# Patient Record
Sex: Male | Born: 1950
Health system: Southern US, Community
[De-identification: ages and names within clinical notes are randomized; demographics above are authoritative.]

## PROBLEM LIST (undated history)

## (undated) DIAGNOSIS — F1410 Cocaine abuse, uncomplicated: Secondary | ICD-10-CM

## (undated) DIAGNOSIS — E7800 Pure hypercholesterolemia, unspecified: Secondary | ICD-10-CM

## (undated) DIAGNOSIS — F4310 Post-traumatic stress disorder, unspecified: Secondary | ICD-10-CM

## (undated) DIAGNOSIS — F1010 Alcohol abuse, uncomplicated: Secondary | ICD-10-CM

## (undated) DIAGNOSIS — S2232XA Fracture of one rib, left side, initial encounter for closed fracture: Secondary | ICD-10-CM

## (undated) DIAGNOSIS — C61 Malignant neoplasm of prostate: Secondary | ICD-10-CM

## (undated) DIAGNOSIS — F419 Anxiety disorder, unspecified: Secondary | ICD-10-CM

## (undated) DIAGNOSIS — C50919 Malignant neoplasm of unspecified site of unspecified female breast: Secondary | ICD-10-CM

## (undated) DIAGNOSIS — Z720 Tobacco use: Secondary | ICD-10-CM

## (undated) DIAGNOSIS — F32A Depression, unspecified: Secondary | ICD-10-CM

## (undated) DIAGNOSIS — I639 Cerebral infarction, unspecified: Secondary | ICD-10-CM

## (undated) DIAGNOSIS — K224 Dyskinesia of esophagus: Secondary | ICD-10-CM

## (undated) DIAGNOSIS — I2510 Atherosclerotic heart disease of native coronary artery without angina pectoris: Secondary | ICD-10-CM

## (undated) DIAGNOSIS — I771 Stricture of artery: Secondary | ICD-10-CM

## (undated) HISTORY — DX: Stricture of artery: I77.1

## (undated) HISTORY — DX: Post-traumatic stress disorder, unspecified: F43.10

## (undated) HISTORY — DX: Depression, unspecified: F32.A

## (undated) HISTORY — DX: Dyskinesia of esophagus: K22.4

## (undated) HISTORY — PX: CAROTID STENT: SHX1301

## (undated) HISTORY — PX: CORONARY ARTERY BYPASS GRAFT: SHX141

## (undated) HISTORY — DX: Tobacco use: Z72.0

---

## 1997-11-18 ENCOUNTER — Emergency Department (HOSPITAL_COMMUNITY): Admission: EM | Admit: 1997-11-18 | Discharge: 1997-11-18 | Payer: Self-pay | Admitting: Emergency Medicine

## 1997-12-28 ENCOUNTER — Emergency Department (HOSPITAL_COMMUNITY): Admission: EM | Admit: 1997-12-28 | Discharge: 1997-12-28 | Payer: Self-pay | Admitting: Emergency Medicine

## 1998-02-20 ENCOUNTER — Ambulatory Visit (HOSPITAL_COMMUNITY): Admission: RE | Admit: 1998-02-20 | Discharge: 1998-02-20 | Payer: Self-pay | Admitting: Internal Medicine

## 1998-04-17 ENCOUNTER — Ambulatory Visit (HOSPITAL_COMMUNITY): Admission: RE | Admit: 1998-04-17 | Discharge: 1998-04-17 | Payer: Self-pay | Admitting: Family Medicine

## 1998-07-13 ENCOUNTER — Encounter: Payer: Self-pay | Admitting: Internal Medicine

## 1998-07-13 ENCOUNTER — Ambulatory Visit (HOSPITAL_COMMUNITY): Admission: RE | Admit: 1998-07-13 | Discharge: 1998-07-13 | Payer: Self-pay | Admitting: Internal Medicine

## 1998-08-20 ENCOUNTER — Emergency Department (HOSPITAL_COMMUNITY): Admission: EM | Admit: 1998-08-20 | Discharge: 1998-08-21 | Payer: Self-pay

## 1998-10-02 ENCOUNTER — Emergency Department (HOSPITAL_COMMUNITY): Admission: EM | Admit: 1998-10-02 | Discharge: 1998-10-02 | Payer: Self-pay | Admitting: Emergency Medicine

## 1998-10-02 ENCOUNTER — Encounter: Payer: Self-pay | Admitting: Emergency Medicine

## 1999-03-10 ENCOUNTER — Emergency Department (HOSPITAL_COMMUNITY): Admission: EM | Admit: 1999-03-10 | Discharge: 1999-03-10 | Payer: Self-pay | Admitting: Emergency Medicine

## 1999-03-10 ENCOUNTER — Encounter: Payer: Self-pay | Admitting: Emergency Medicine

## 1999-03-14 ENCOUNTER — Emergency Department (HOSPITAL_COMMUNITY): Admission: EM | Admit: 1999-03-14 | Discharge: 1999-03-15 | Payer: Self-pay | Admitting: Emergency Medicine

## 1999-03-14 ENCOUNTER — Encounter: Payer: Self-pay | Admitting: Emergency Medicine

## 2000-01-07 ENCOUNTER — Encounter: Payer: Self-pay | Admitting: Emergency Medicine

## 2000-01-07 ENCOUNTER — Emergency Department (HOSPITAL_COMMUNITY): Admission: EM | Admit: 2000-01-07 | Discharge: 2000-01-07 | Payer: Self-pay | Admitting: Emergency Medicine

## 2000-03-06 ENCOUNTER — Emergency Department (HOSPITAL_COMMUNITY): Admission: EM | Admit: 2000-03-06 | Discharge: 2000-03-07 | Payer: Self-pay | Admitting: Emergency Medicine

## 2000-06-10 ENCOUNTER — Inpatient Hospital Stay (HOSPITAL_COMMUNITY): Admission: EM | Admit: 2000-06-10 | Discharge: 2000-06-11 | Payer: Self-pay | Admitting: Emergency Medicine

## 2000-06-10 ENCOUNTER — Encounter: Payer: Self-pay | Admitting: Emergency Medicine

## 2001-11-06 ENCOUNTER — Encounter: Payer: Self-pay | Admitting: Cardiothoracic Surgery

## 2001-11-08 ENCOUNTER — Encounter: Payer: Self-pay | Admitting: Cardiothoracic Surgery

## 2001-11-08 ENCOUNTER — Inpatient Hospital Stay (HOSPITAL_COMMUNITY): Admission: RE | Admit: 2001-11-08 | Discharge: 2001-11-15 | Payer: Self-pay | Admitting: Cardiothoracic Surgery

## 2001-11-09 ENCOUNTER — Encounter: Payer: Self-pay | Admitting: Cardiothoracic Surgery

## 2001-11-10 ENCOUNTER — Encounter: Payer: Self-pay | Admitting: Cardiothoracic Surgery

## 2001-11-11 ENCOUNTER — Encounter: Payer: Self-pay | Admitting: Cardiothoracic Surgery

## 2001-11-12 ENCOUNTER — Encounter: Payer: Self-pay | Admitting: Cardiothoracic Surgery

## 2001-11-13 ENCOUNTER — Encounter: Payer: Self-pay | Admitting: Cardiothoracic Surgery

## 2001-11-14 ENCOUNTER — Encounter: Payer: Self-pay | Admitting: Cardiothoracic Surgery

## 2001-11-15 ENCOUNTER — Encounter: Payer: Self-pay | Admitting: Cardiothoracic Surgery

## 2001-12-21 ENCOUNTER — Encounter: Admission: RE | Admit: 2001-12-21 | Discharge: 2001-12-21 | Payer: Self-pay | Admitting: Cardiothoracic Surgery

## 2001-12-21 ENCOUNTER — Encounter: Payer: Self-pay | Admitting: Cardiothoracic Surgery

## 2002-01-23 ENCOUNTER — Encounter: Payer: Self-pay | Admitting: Neurology

## 2002-01-23 ENCOUNTER — Encounter: Payer: Self-pay | Admitting: Emergency Medicine

## 2002-01-23 ENCOUNTER — Inpatient Hospital Stay (HOSPITAL_COMMUNITY): Admission: EM | Admit: 2002-01-23 | Discharge: 2002-01-25 | Payer: Self-pay | Admitting: Emergency Medicine

## 2002-01-24 ENCOUNTER — Encounter: Payer: Self-pay | Admitting: Neurology

## 2002-11-23 ENCOUNTER — Emergency Department (HOSPITAL_COMMUNITY): Admission: EM | Admit: 2002-11-23 | Discharge: 2002-11-23 | Payer: Self-pay | Admitting: Emergency Medicine

## 2002-11-23 ENCOUNTER — Encounter: Payer: Self-pay | Admitting: Emergency Medicine

## 2002-12-03 ENCOUNTER — Emergency Department (HOSPITAL_COMMUNITY): Admission: EM | Admit: 2002-12-03 | Discharge: 2002-12-04 | Payer: Self-pay | Admitting: Emergency Medicine

## 2003-03-18 ENCOUNTER — Encounter: Payer: Self-pay | Admitting: Emergency Medicine

## 2003-03-18 ENCOUNTER — Emergency Department (HOSPITAL_COMMUNITY): Admission: EM | Admit: 2003-03-18 | Discharge: 2003-03-19 | Payer: Self-pay | Admitting: Emergency Medicine

## 2003-03-19 ENCOUNTER — Encounter: Payer: Self-pay | Admitting: Emergency Medicine

## 2003-03-28 ENCOUNTER — Encounter: Payer: Self-pay | Admitting: Orthopedic Surgery

## 2003-03-28 ENCOUNTER — Ambulatory Visit (HOSPITAL_COMMUNITY): Admission: RE | Admit: 2003-03-28 | Discharge: 2003-03-28 | Payer: Self-pay | Admitting: Orthopedic Surgery

## 2003-05-08 ENCOUNTER — Encounter: Admission: RE | Admit: 2003-05-08 | Discharge: 2003-05-08 | Payer: Self-pay | Admitting: Internal Medicine

## 2003-12-03 ENCOUNTER — Inpatient Hospital Stay (HOSPITAL_COMMUNITY): Admission: RE | Admit: 2003-12-03 | Discharge: 2003-12-06 | Payer: Self-pay | Admitting: Urology

## 2003-12-03 ENCOUNTER — Encounter (INDEPENDENT_AMBULATORY_CARE_PROVIDER_SITE_OTHER): Payer: Self-pay | Admitting: Specialist

## 2005-05-20 ENCOUNTER — Inpatient Hospital Stay (HOSPITAL_COMMUNITY): Admission: EM | Admit: 2005-05-20 | Discharge: 2005-05-25 | Payer: Self-pay | Admitting: Emergency Medicine

## 2005-05-31 ENCOUNTER — Observation Stay (HOSPITAL_COMMUNITY): Admission: EM | Admit: 2005-05-31 | Discharge: 2005-06-02 | Payer: Self-pay | Admitting: Emergency Medicine

## 2005-07-12 ENCOUNTER — Emergency Department (HOSPITAL_COMMUNITY): Admission: EM | Admit: 2005-07-12 | Discharge: 2005-07-12 | Payer: Self-pay | Admitting: Emergency Medicine

## 2005-07-28 ENCOUNTER — Encounter (HOSPITAL_COMMUNITY): Admission: RE | Admit: 2005-07-28 | Discharge: 2005-10-26 | Payer: Self-pay | Admitting: Cardiovascular Disease

## 2006-02-11 ENCOUNTER — Observation Stay (HOSPITAL_COMMUNITY): Admission: EM | Admit: 2006-02-11 | Discharge: 2006-02-13 | Payer: Self-pay | Admitting: Emergency Medicine

## 2007-09-03 IMAGING — CT CT ANGIO ABDOMEN
2 of 9 series · 16 of 46 positions shown, 18 images · IV contrast (120 ML OMNI 300)
Comparison: None

CLINICAL DATA: Left scapular and chest pain. Question dissection

CT ANGIOGRAPHY OF CHEST
TECHNIQUE: Multidetector CT imaging of the chest was performed during bolus
injection of intravenous contrast.  Multiplanar CT angiographic image
reconstructions were generated to evaluate the vascular anatomy.
Contrast:  120 cc Omnipaque 300
TECHNIQUE: Multidetector CT imaging of the abdomen was performed before and
during bolus injection of intravenous contrast.  Multiplanar CT angiographic
image reconstructions were generated to evaluate the vascular anatomy.

[Series 7: recon 3: aortic disection · axial · 0.76mm/px · z∈[-446,-45]mm · 13 of 375 slices shown, 15 images]
[im 27/375  soft-tissue]
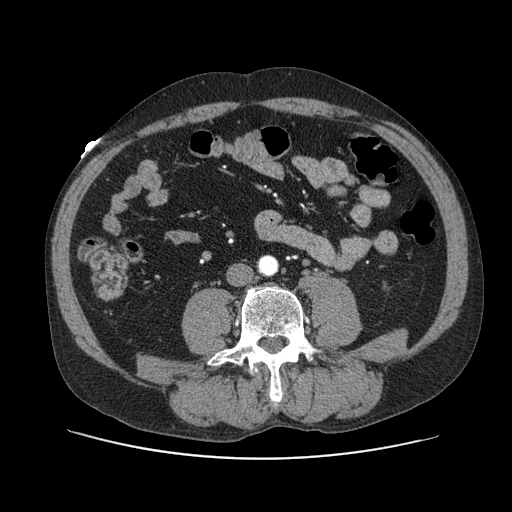
[im 27/375  bone]
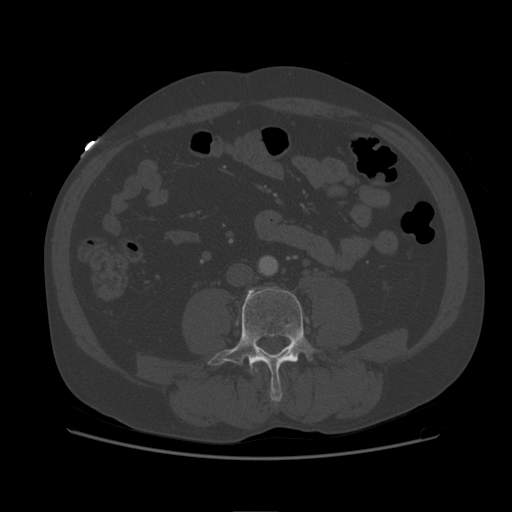
[im 54/375  soft-tissue]
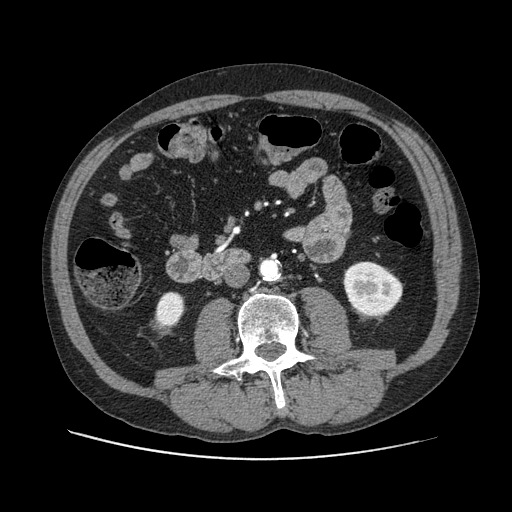
[im 81/375  soft-tissue]
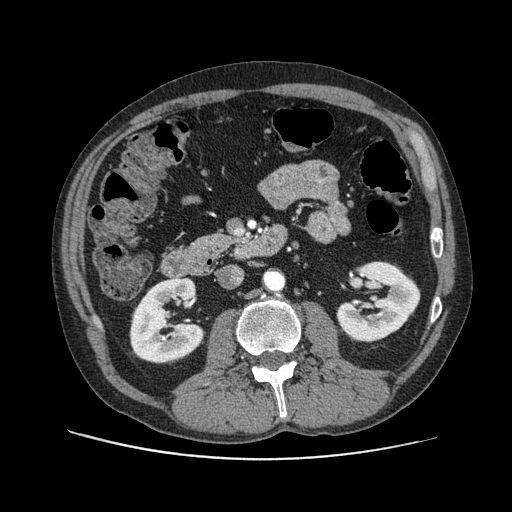
[im 107/375  soft-tissue]
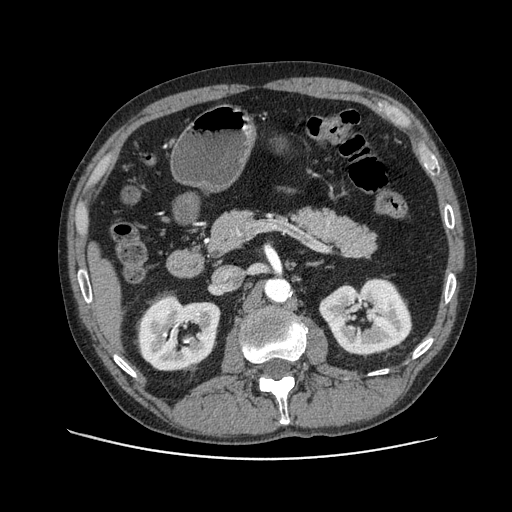
[im 134/375  soft-tissue]
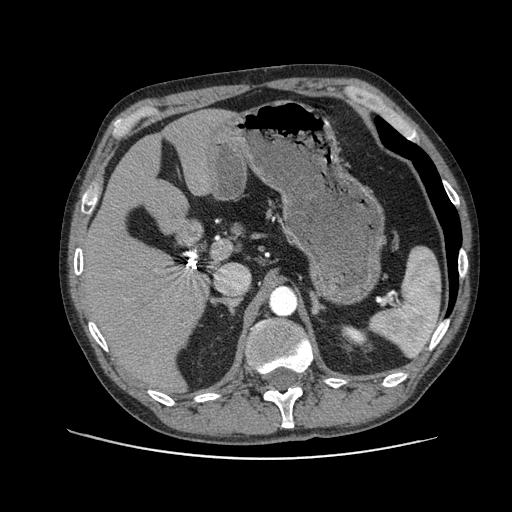
[im 161/375  soft-tissue]
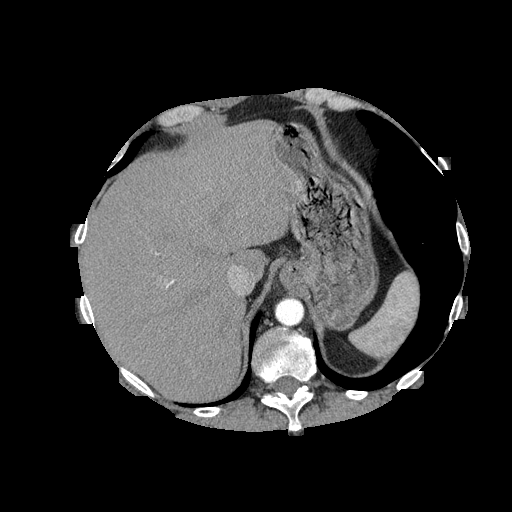
[im 188/375  soft-tissue]
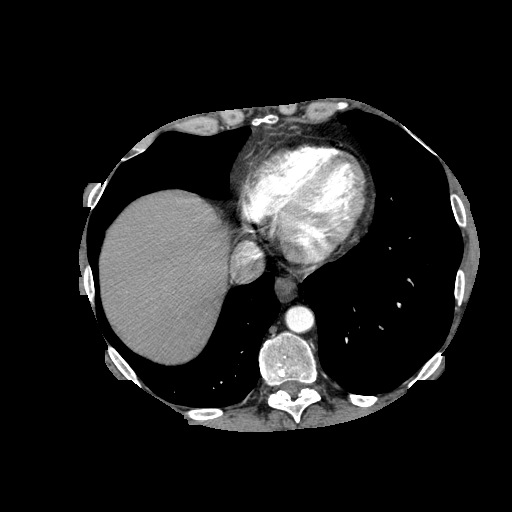
[im 214/375  soft-tissue]
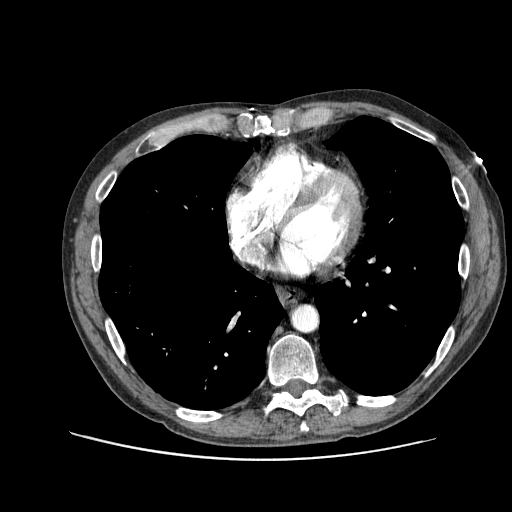
[im 241/375  soft-tissue]
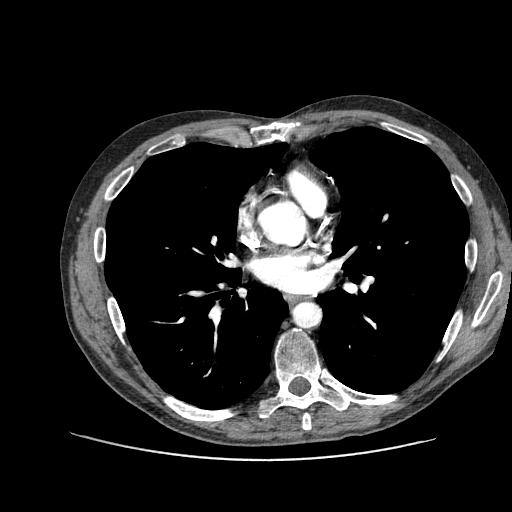
[im 241/375  bone]
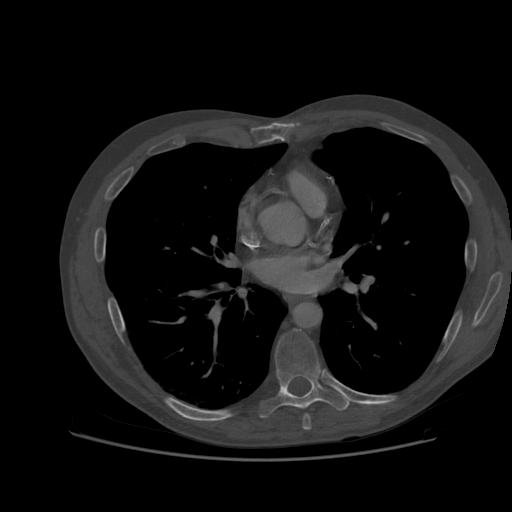
[im 268/375  soft-tissue]
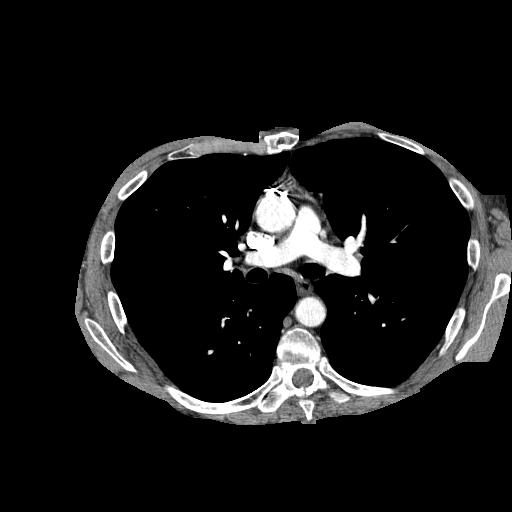
[im 294/375  soft-tissue]
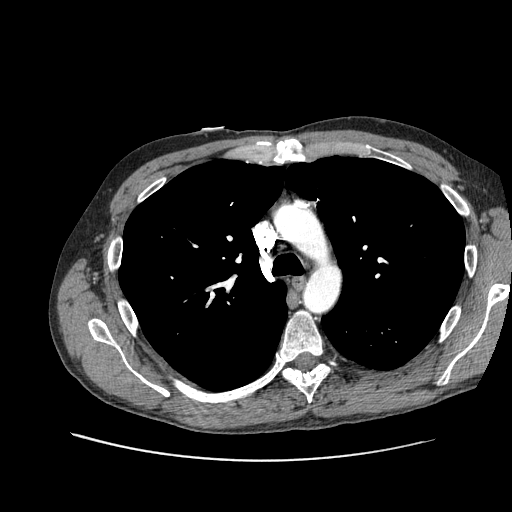
[im 321/375  soft-tissue]
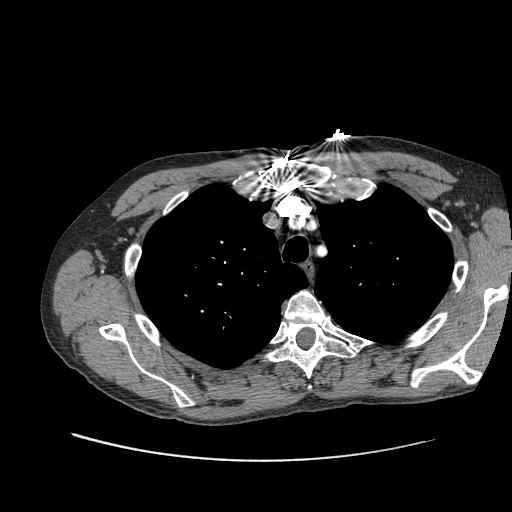
[im 348/375  soft-tissue]
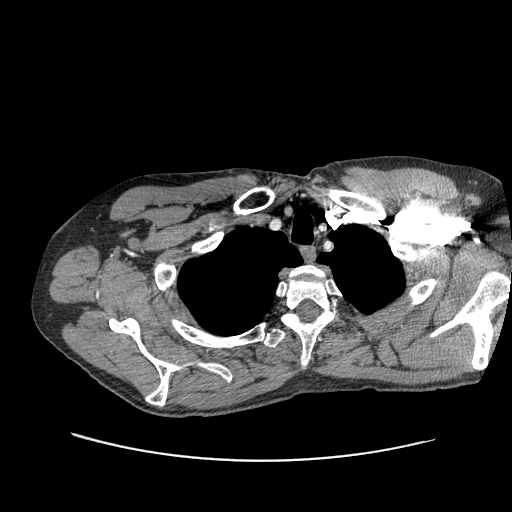

[Series 701: reformatted · sagittal · 0.94mm/px · 3 of 166 slices shown]
[im 56/166  soft-tissue]
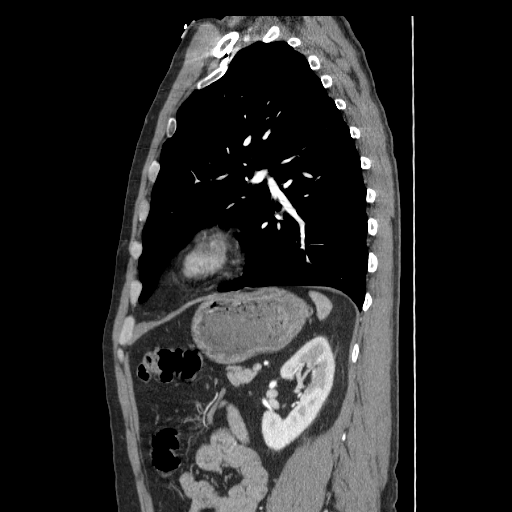
[im 74/166  soft-tissue]
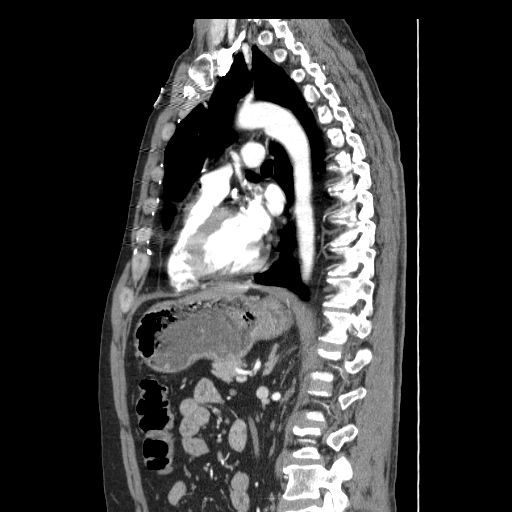
[im 92/166  soft-tissue]
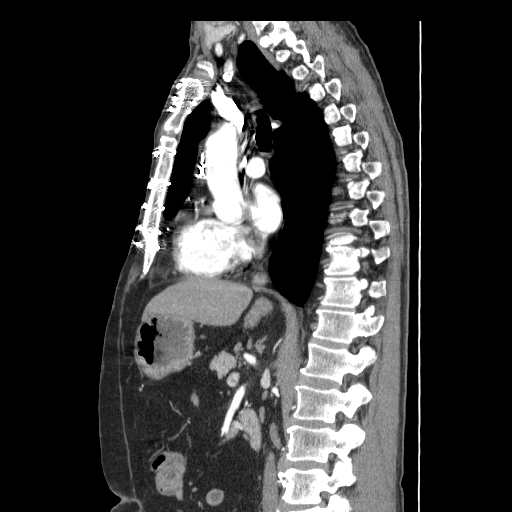

[16 of 46 positions shown; findings below may reference images not displayed]

FINDINGS: No evidence of aortic dissection or aneurysm. There is atherosclerotic
disease in the aortic arch and proximal great vessels. There appears to be
significant stenosis of the left subclavian artery. Left vertebral artery not
visualized on this study. No filling defects in the pulmonary arteries to
suggest pulmonary emboli.

Patient status post CABG. Heart is normal size. There is dependent atelectasis
bilaterally. No mediastinal, hilar, or axillary adenopathy. There is a posterior
medial left lower lobe nodule which is smoothly marginated and abuts the
posterior medial pleural surface measuring 9 mm. This can be followed with 6
month repeat study.

IMPRESSION

No evidence of aortic dissection or aneurysm.

No pulmonary embolus

Atherosclerotic disease in the aortic arch and proximal great vessels. There is
moderate to severe stenosis of the left subclavian artery likely just proximal
to the takeoff of the left vertebral artery. The left vertebral artery is not
visualized on this study. Proximal right vertebral artery and carotid arteries
are unremarkable. 

There is post CABG.

CT ANGIOGRAPHY OF ABDOMEN
FINDINGS: Aorta shows no evidence of dissection or aneurysm. Mild arthroscopic
calcification and mural plaque.

Liver, spleen, pancreas, , kidneys unremarkable. Small porta hepatis and
gastrohepatic ligament lymph nodes, none of which are pathologically enlarged.
Mild prominence of the adrenal glands diffusely most compatible with
hyperplasia. Status post cholecystectomy. No free fluid, free air, or adenopathy
bowel grossly unremarkable.

IMPRESSION

No evidence of aortic dissection or aneurysm.

Mild bilateral adrenal hyperplasia.

## 2007-09-06 IMAGING — CR DG CHEST 2V
2 series · 2 of 2 positions shown · non-contrast
Comparison: none

HISTORY: Unstable angina, precatheterization

CHEST 2 VIEWS:
Comparison 05/23/2005
Normal heart size status post CABG.
Right arm PICC line stable, tip SVC.
Pulmonary vascularity and mediastinal contours moderate
Changes of COPD with minimal chronic bronchitis.
No infiltrate or effusion.
No pneumothorax.

[w chest pa]
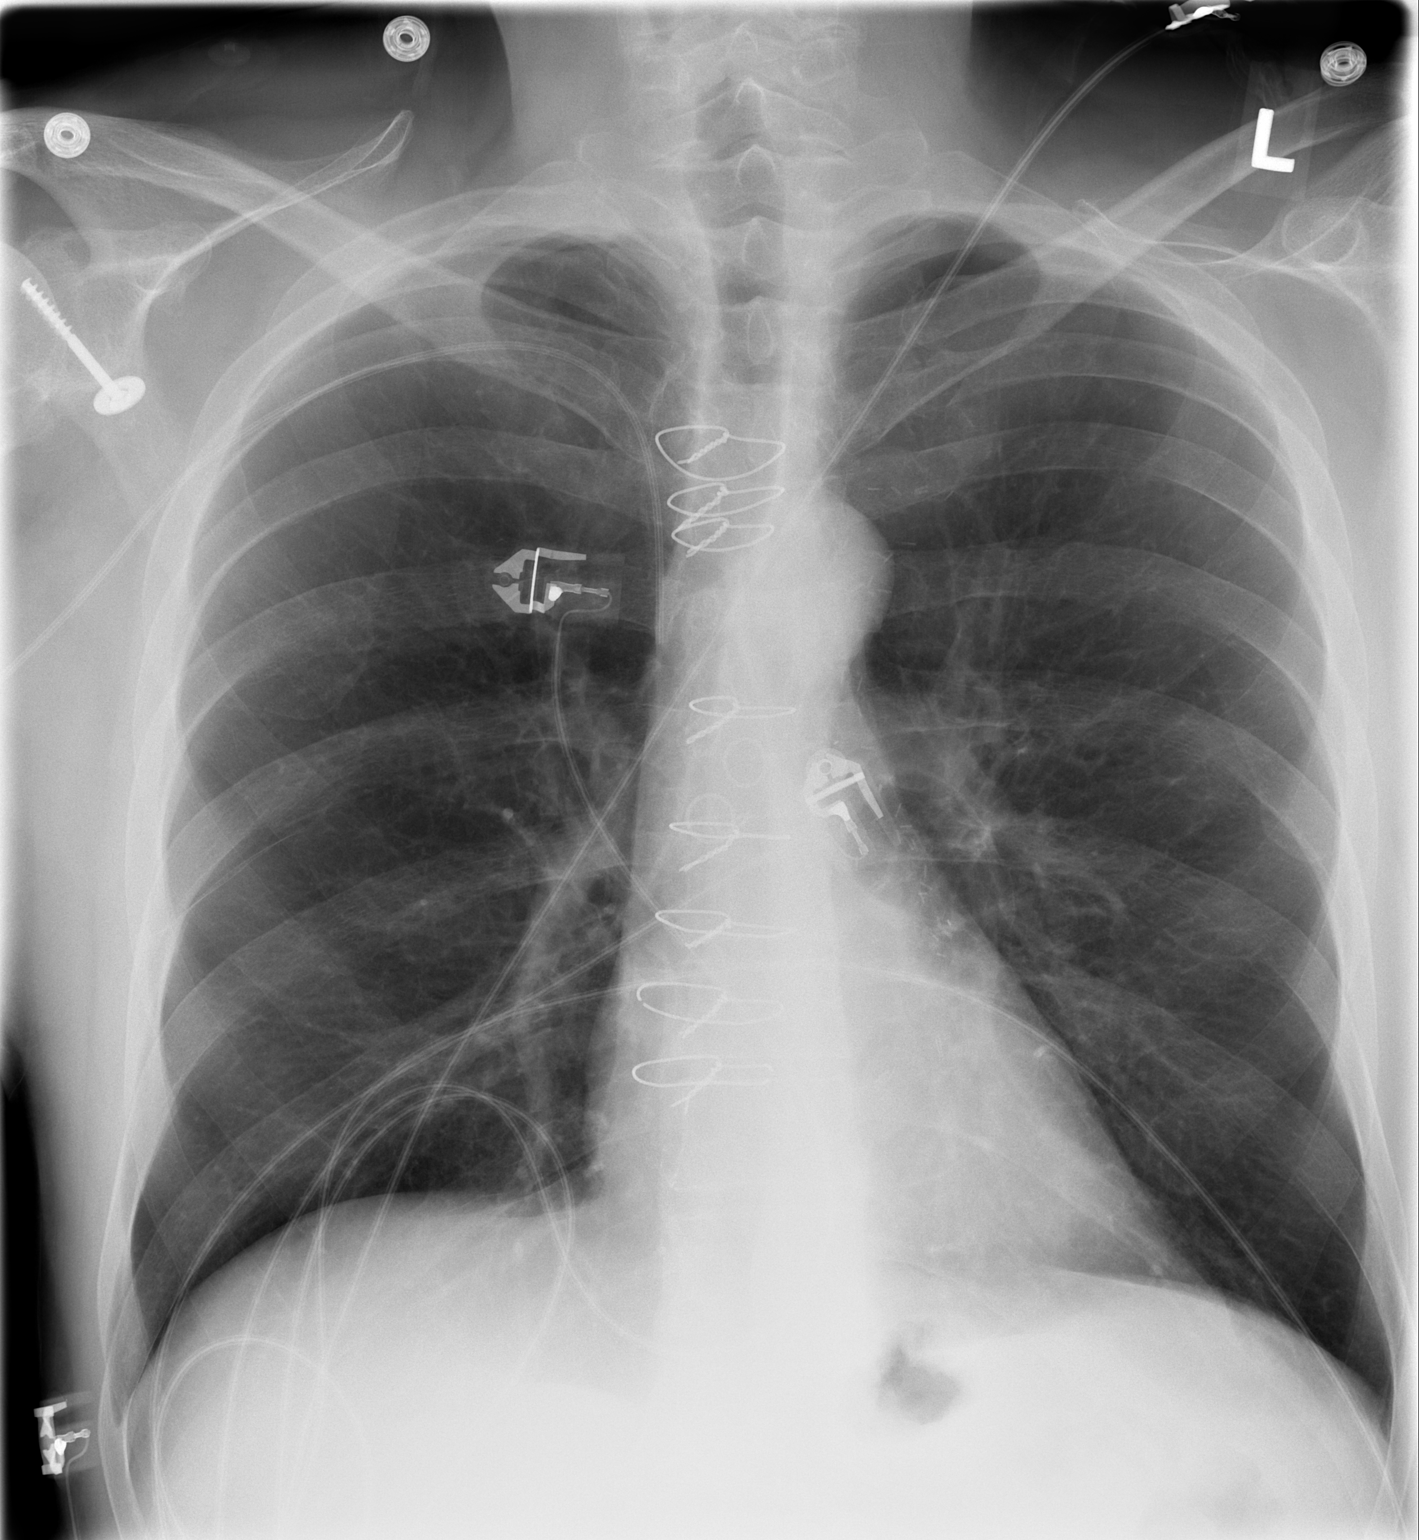

[w chest lat]
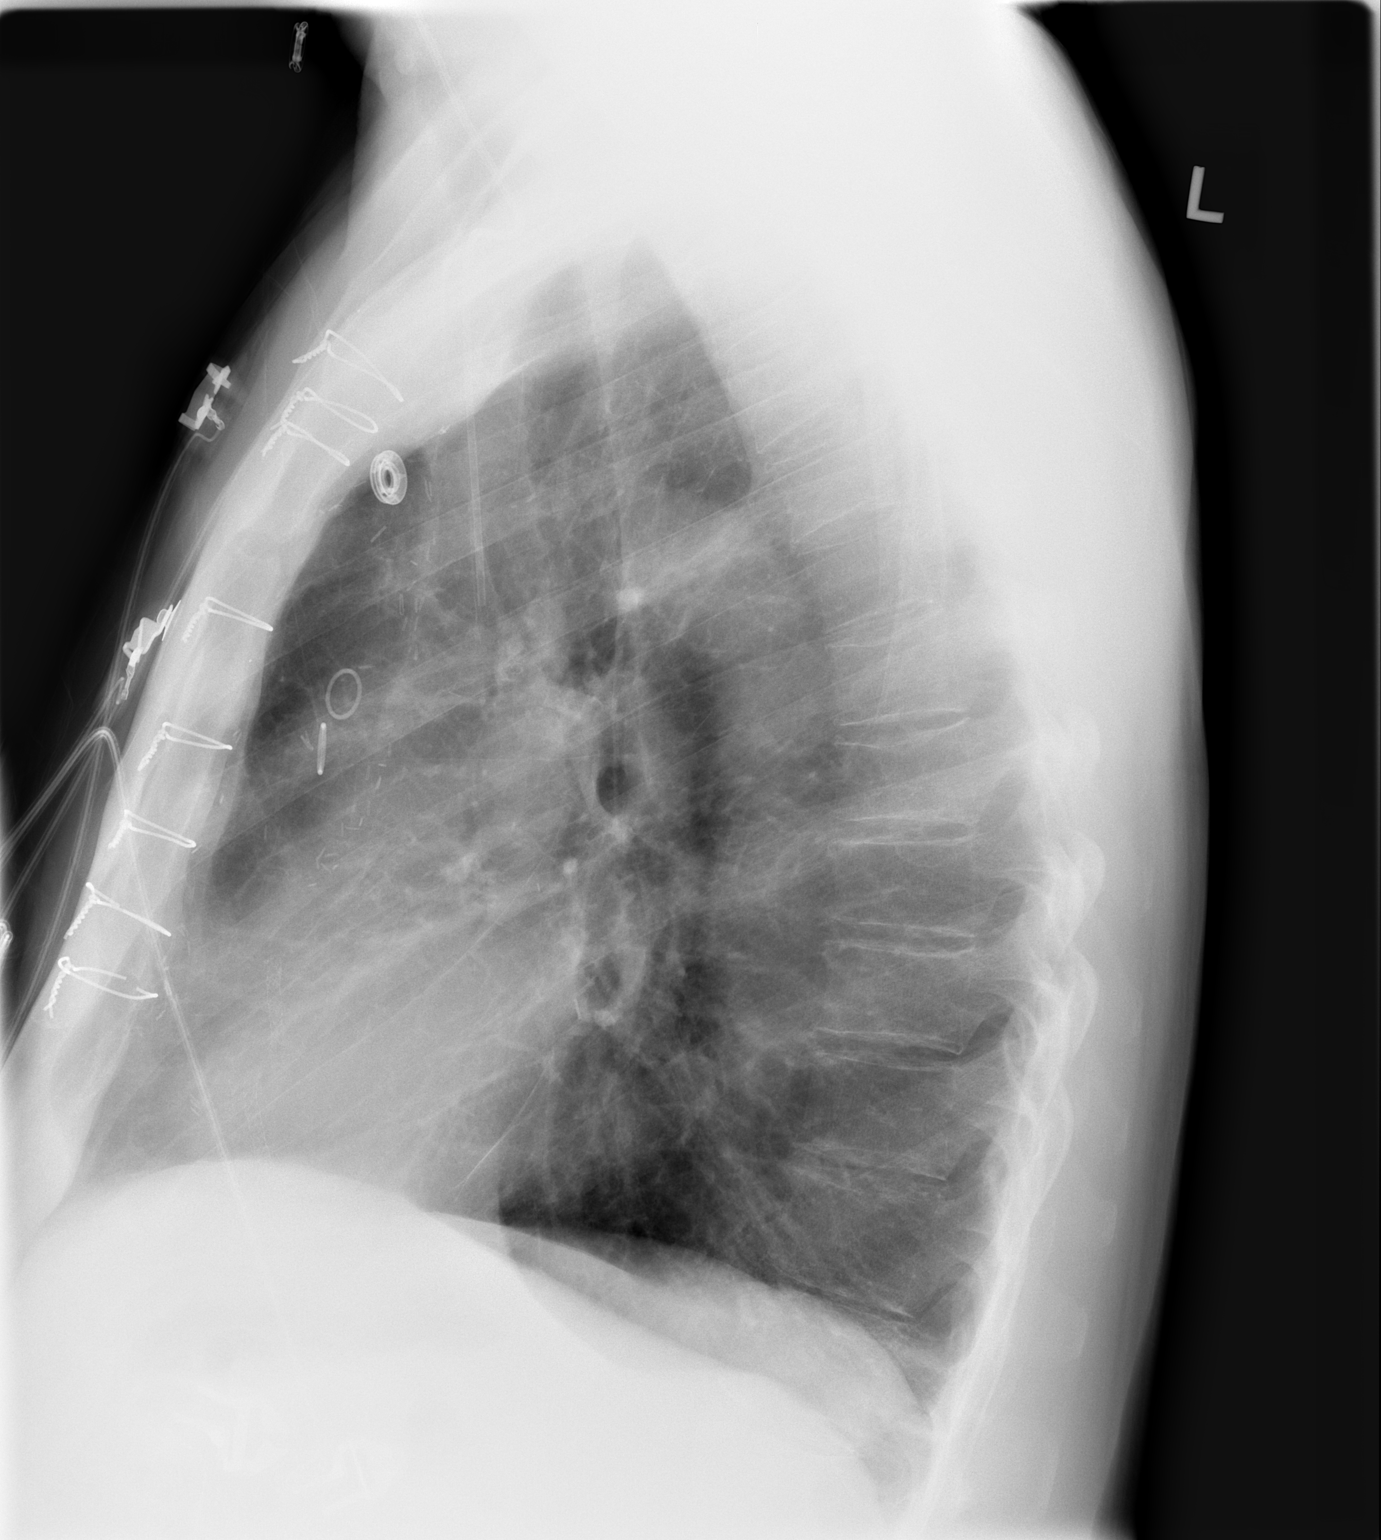

[2 of 2 positions shown; findings below may reference images not displayed]

IMPRESSION: CABG and COPD.
No acute abnormalities.

## 2009-12-24 ENCOUNTER — Emergency Department (HOSPITAL_COMMUNITY): Admission: EM | Admit: 2009-12-24 | Discharge: 2009-12-24 | Payer: Self-pay | Admitting: Emergency Medicine

## 2009-12-26 ENCOUNTER — Emergency Department (HOSPITAL_COMMUNITY): Admission: EM | Admit: 2009-12-26 | Discharge: 2009-12-26 | Payer: Self-pay | Admitting: Emergency Medicine

## 2010-06-06 ENCOUNTER — Emergency Department (HOSPITAL_COMMUNITY)
Admission: EM | Admit: 2010-06-06 | Discharge: 2010-06-06 | Payer: Self-pay | Source: Home / Self Care | Admitting: Emergency Medicine

## 2010-08-21 LAB — URINALYSIS, ROUTINE W REFLEX MICROSCOPIC
Bilirubin Urine: NEGATIVE
Glucose, UA: NEGATIVE mg/dL
Hgb urine dipstick: NEGATIVE
Ketones, ur: NEGATIVE mg/dL
Nitrite: NEGATIVE
Protein, ur: NEGATIVE mg/dL
Specific Gravity, Urine: 1.012 (ref 1.005–1.030)
Urobilinogen, UA: 0.2 mg/dL (ref 0.0–1.0)
pH: 6 (ref 5.0–8.0)

## 2010-08-21 LAB — COMPREHENSIVE METABOLIC PANEL
ALT: 18 U/L (ref 0–53)
AST: 19 U/L (ref 0–37)
Albumin: 3.4 g/dL — ABNORMAL LOW (ref 3.5–5.2)
Alkaline Phosphatase: 64 U/L (ref 39–117)
BUN: 4 mg/dL — ABNORMAL LOW (ref 6–23)
CO2: 21 mEq/L (ref 19–32)
Calcium: 8.4 mg/dL (ref 8.4–10.5)
Chloride: 108 mEq/L (ref 96–112)
Creatinine, Ser: 0.68 mg/dL (ref 0.4–1.5)
GFR calc Af Amer: >60 mL/min (ref >60)
GFR calc non Af Amer: >60 mL/min (ref >60)
Glucose, Bld: 100 mg/dL — ABNORMAL HIGH (ref 70–99)
Potassium: 4.1 mEq/L (ref 3.5–5.1)
Sodium: 135 mEq/L (ref 135–145)
Total Bilirubin: 0.4 mg/dL (ref 0.3–1.2)
Total Protein: 6.4 g/dL (ref 6.0–8.3)

## 2010-08-21 LAB — LIPASE, BLOOD: Lipase: 29 U/L (ref 11–59)

## 2010-10-22 NOTE — Discharge Summary (Signed)
. [facility]  Patient:    [name] Pace, [name] Pace Visit Number: [id number] MRN: [id number]          Service Type: SUR Location: 2000 2009 01 Attending Physician:  Mikey Bussing Dictated by:   Adair Patter, P.A. Admit Date:  11/08/2001 Discharge Date: 11/15/2001   CC:         Runell Gess, M.D., [facility]   Discharge Summary  DATE OF BIRTH:  1951-03-27  ADMITTING DIAGNOSIS:  Exertional angina.  SECONDARY DIAGNOSES:  1. Hypertension.  2. History of infectious hepatitis.  3. Postoperative anemia secondary to blood loss.  DISCHARGE DIAGNOSIS:  Coronary artery disease.  HOSPITAL COURSE:  The patient was admitted to [facility] on November 08, 2001, secondary to exertional angina and three-vessel coronary artery disease. He was seen and evaluated by Dr. Donata Clay as an outpatient.  Dr. Donata Clay recommended surgical correction.  On date of admission, patient underwent a coronary artery bypass graft x 3 with left internal mammary artery anastomosis to the left anterior descending artery, saphenous vein graft to the right coronary artery, and saphenous vein graft to the circumflex artery.  No complications were noted during procedure.  Postoperatively, patient required some pressor support with dopamine.  This was discontinued as tolerated. Patient was started on digoxin for heart rate control.  Other than some minor pulmonary issues, patient had a relatively uneventful hospital course.  He was started on empiric Tequin, given a 14 day course.  He also underwent aggressive pulmonary toilet and started on Combivent inhaler.  Good heart rate and blood pressure control was obtained with Toprol and Altace.  He was subsequently deemed stable for discharge on November 15, 2001.  MEDICATIONS AT TIME OF DISCHARGE:  1. Tylox one to two tablets every 4 to 6 hours as needed for pain.  2. Tequin 400 mg one daily for nine  days.  3. Combivent inhaler 18-90 mcg two puffs four times daily.  4. Multivitamin one daily.  5. Niferex 150 one daily.  6. Colace 100 mg one tablet twice daily.  7. Guaifenesin 600 mg two tablets twice daily for nine days.  8. Prevacid 30 mg one daily.  9. Aspirin 325 mg one daily. 10. Digoxin 0.125 mg one daily. 11. Toprol XL 25 mg one daily. 12. Lipitor 20 mg one daily. 13. Altace 2.5 mg one twice daily.  ACTIVITY:  Patient was told no driving, strenuous activity, or lifting heavy objects.  He was told to walk and continue breathing exercises.  DISCHARGE DIET:  Low fat, low salt.  WOUND CARE:  Patient was told he could shower and clean his incision with soap and water.  He will have his staples removed in approximately 10 days.  We will arrange a visiting nurse to do this.  DISPOSITION:  Home.  FOLLOW-UP:  Patient was told to call his cardiologist, Dr. Allyson Sabal, for a two-week appointment.  He was told to see Dr. Donata Clay on Friday, July 11, at 11:15 a.m.  He was told to go to the [facility] one hour before his appointment for chest x-ray. Dictated by:   Adair Patter, P.A. Attending Physician:  Mikey Bussing DD:  11/15/01 TD:  11/17/01 Job: [id number]

## 2010-10-22 NOTE — Cardiovascular Report (Signed)
NAME:  [name] Pace, [name] Pace NO.:  [id number]   MEDICAL RECORD NO.:  [id number]          PATIENT TYPE:  INP   LOCATION:  6524                         FACILITY:  [facility]   PHYSICIAN:  Nicki Guadalajara, M.D.     DATE OF BIRTH:  01/09/1951   DATE OF PROCEDURE:  05/23/2005  DATE OF DISCHARGE:                              CARDIAC CATHETERIZATION   INDICATIONS:  [name] is a 60 year old patient of Dr. Allyson Sabal who  underwent CBG revascularization surgery in 2003 by Dr. Donata Clay with a LIMA  to his LAD, saphenous vein graft to his circumflex marginal and saphenous  vein graft to his right coronary artery. Ejection fraction at that time was  45%. The patient was admitted with chest pain to [facility] on  May 21, 2005. He also was noted to have blood pressure differential  between both arms and a CT scan raised the possibility of left subclavian  stenosis. He is now scheduled for cardiac catheterization.   PROCEDURE:  After premedication with Versed 2 milligrams intravenously, the  patient was prepped and draped in the usual fashion. Right femoral artery  was punctured anteriorly and a 5-French sheath was inserted. Diagnostic  catheterization was done with 5-French Judkins 4 left and right coronary  catheters. The right catheter was used for selective angiography into the  vein grafts as well as into the left subclavian system. The left internal  mammary artery was well visualized and in a retrograde fashion from the LAD  system all way up to the subclavian and because of the high-grade subclavian  stenosis this was not crossed with the catheter but multiple views were  performed to document the extent of stenosis in this left subclavian artery  jeopardizing antegrade flow. The pigtail catheter was used for biplane cine  left ventriculography as well as distal aortography.   With the demonstration of total occlusion of the vein graft supplying his  right coronary  artery with a least three high-grade lesions in the native  right coronary artery, the decision was made while the patient was on the  table to open up his native right coronary artery and ultimately plan staged  peripheral intervention to his high-grade subclavian stenosis. Integrilin  double bolus and weight-adjusted heparinization 41 units were administered.  600 milligrams of oral Plavix was given. The patient received an additional  2 milligrams Versed. The 5-French sheath was upgraded to a 6-French sheath  without difficulty. Due to the upward takeoff of the native right coronary  artery, ultimately a 6-French hockey stick with side holes guide was used  for the interventional procedure. An ATW wire was advanced down the native  right coronary artery and was used to help judge lesion length.  Predilatation was done with a 2.5 x 20 mm Maverick balloon in the region of  the crux with dilatation of both lesions in the crux and beyond and also at  the mid-RCA level. A 3.0 x 28 mm drug-eluting Cypher stent was then  successfully deployed with careful attention to make certain the tandem  lesions in  the crux and beyond were covered by this stent. This was dilated  x2 up to 17 atmospheres. A 3.0 x 23 mm drug-eluting Cypher stent was then  inserted at the mid-right coronary artery lesion with careful attention to  make certain that this entire lesion was covered with the stented segment.  Poststent dilatation was done utilizing a 3.25 x 20 mm Quantum balloon in  both stents with dilatation up to 12 atmospheres corresponding to a 3.25 mm  size. Scout angiography confirmed an excellent angiographic result. ACT was  documented therapeutic throughout the procedure. During the procedure, the  patient also received numerous doses of intracoronary nitroglycerin.  Arterial sheath was sutured in place. Plans for sheath removal later today.   HEMODYNAMIC DATA:  The central aortic pressure was 150/80.  Left ventricular  pressure was 150/12, post A-wave 25.   ANGIOGRAPHIC DATA:  The left main coronary artery was a long vessel with  mild irregularity and 10-20% distal narrowing. It was hard to see the native  circumflex arise from this but I believe this did bifurcate into the LAD and  small circumflex system.   The LAD gave rise to the first diagonal vessel with 95% stenosis. There was  irregularity of the proximal to mid-LAD without high-grade stenoses. There  was excellent retrograde filling of the entire LIMA vessel up to the  subclavian visualization. The LIMA was widely patent without stenosis.   The circumflex vessel was small caliber proximally with 95% stenosis. This  vessel was occluded.   The right coronary artery was diffusely diseased and had 10-20% narrowing  proximally, followed by 90% mid-stenosis just beyond the anterior RV  marginal branch. There were then tandem stenoses in the crux and beyond of  80% proximal to the takeoff of the PDA vessel.   The vein graft supplying the right coronary was totally occluded at its  origin.   The vein graft supplying the marginal vessel was widely patent.   The left subclavian artery had a focal eccentric 90-95% stenosis proximal to  the LIMA takeoff. As noted above, the LIMA graft itself was patent and  anastomosed into the mid-LAD.   Biplane cine left ventriculography revealed low normal LV function with mild  mid inferior wall hypocontractility and low posterolateral  hypocontractility.   Distal aortography did not demonstrate any significant renal artery  stenosis. There is no significant aortoiliac disease.   Following coronary intervention to the native right coronary artery at three  sites with predilation with a 2.5 x 20 mm Maverick balloon, stenting with a  3.0 x 23 mm Cypher stent at the mid RCA and with two lesions successfully stented with a 3.0 x 28 mm Cypher stent in the region of the crux, with both  stents  being postdilated to 3.25 mm, the entire stented segments were  reduced to 0%. There was TIMI III flow. There was no evidence for  dissection.   IMPRESSION:  1.  Low normal left ventricular function with mild inferior and low      posterolateral hypocontractility.  2.  Significant native coronary artery disease with mild distal taper of the      left main of 20%, diffuse irregularity of the proximal left anterior      descending artery with narrowing of 60-70% in the region of the diagonal      takeoff with 95% diagonal stenosis, total occlusion of the circumflex      vessel, and diffuse native right coronary artery disease of  90% mid and      tandem 80% stenoses in the region of the crux.  3.  Patent left internal mammary artery to the left anterior descending      artery but it jeopardized left internal mammary artery system due to      focal eccentric 95% left subclavian stenosis.  4.  Patent saphenous vein graft to the circumflex marginal vessel.  5.  Occluded saphenous vein graft to the native right coronary artery.  6.  Successful percutaneous transluminal coronary angioplasty/stenting of      the native right coronary artery with placement of 3.0 x 23 mm Cypher      stent in the mid-right coronary artery and placement of a 3.0 x 28 mm      Cypher stent covering two additional lesions in the region of the crux      with both lesions being postdilated to 3.25 mm with a resultant stenoses      being reduced to 0%.  7.  Double bolus Integrilin/weight-adjusted heparinization.   PLAN:  Stage peripheral intervention to the proximal left subclavian artery  which is jeopardizing antegrade LIMA to LAD flow.           ______________________________  Nicki Guadalajara, M.D.     TK/MEDQ  D:  05/23/2005  T:  05/24/2005  Job:  [id number]   cc:   Madaline Savage, M.D.  Fax: [phone]   Nanetta Batty, M.D.  Fax: [phone]   Kerin Perna, M.D.  [address]  [place]  [place] [address]

## 2010-10-22 NOTE — Discharge Summary (Signed)
NAME:  [name] Pace, FALWELL NO.:  [id number]   MEDICAL RECORD NO.:  [id number]          PATIENT TYPE:  INP   LOCATION:  6524                         FACILITY:  [facility]   PHYSICIAN:  Nanetta Batty, M.D.   DATE OF BIRTH:  10/14/1950   DATE OF ADMISSION:  05/20/2005  DATE OF DISCHARGE:                                 DISCHARGE SUMMARY   DISCHARGE DIAGNOSES:  1.  Known coronary artery disease, status post intervention to the right      coronary artery.  2.  Remote coronary artery bypass grafting surgery.  3.  Peripheral vascular disease, status post stenting of the left subclavian      artery.  4.  Chronic obstructive pulmonary disease.  5.  Hyperlipidemia.  6.  Borderline hypertension.   HISTORY OF PRESENT ILLNESS AND HOSPITAL COURSE:  This is a 60 year old  gentleman who is a patient of Dr. Allyson Sabal, who presented to the hospital with  complaints of chest pain and was assessed on admission by Dr. Elsie Lincoln.  He  also complained of left shoulder pain and left scapular pain which was  fairly constant with episodic short-time worsening.  His blood pressure in  the upper extremities also revealed a deficit of 30 mmHg.   The patient was admitted to telemetry unit on rule out MI protocol and was  planned to undergo coronary angiography.   HOSPITAL PROCEDURES:  May 23, 2005, Dr. Tresa Endo performed cardiac  catheterization that showed right coronary artery patent stents and 90%  proximal-to-mid right coronary artery stenosis with 80% mid right coronary  artery disease and he deployed 2 CYPHER stents in these lesions with good  results.  Angiography of the aorta and carotid artery revealed stenosis of  the left subclavian artery.   May 24, 2005, the patient underwent stenting of the left subclavian  artery by Dr. Allyson Sabal with a good result and reduction of the lesion from 90%  to 0%; the patient tolerated the procedure well and the next morning was  assess by Dr. Allyson Sabal and  found to be in stable condition for discharge home.  His groin did not reveal any signs of hematoma, oozing or ecchymosis.   HOSPITAL LABORATORY DATA:  His CBC revealed hemoglobin of 12.9, hematocrit  36.3, platelets 238,000 and white blood cell count 9.2.  CMET revealed  sodium 137, potassium 3.8, chloride 106, CO2 26, BUN 9, creatinine 1.0,  glucose 95.  Enzymes were negative x3.  Cholesterol total 220, LDL 129, HDL  36, triglycerides 276 and the patient was started on statin therapy here in  the hospital.   DISCHARGE MEDICATIONS:  1.  Aspirin 325 mg daily.  2.  Plavix 75 mg daily.  3.  Xanax 1 mg 3 times daily p.r.n.  4.  Protonix 40 mg daily.  5.  Lisinopril 10 mg daily.  6.  Lopressor 25 mg twice daily.  7.  Zocor 20 mg daily.   DISCHARGE DIET:  Low-salt, low-fat, low-cholesterol diet.   DISCHARGE ACTIVITY:  No driving, no lifting greater than 5 pounds for 3 days  post cath.  DISCHARGE FOLLOWUP:  Appointment for upper extremity ultrasound scheduled on  June 16, 2005 at 10 a.m. and followup appointment with Dr. Allyson Sabal on  June 21, 2005 at 2:30 p.m.      Raymon Mutton, P.A.      Nanetta Batty, M.D.  Electronically Signed    MK/MEDQ  D:  05/25/2005  T:  05/27/2005  Job:  [id number]

## 2010-10-22 NOTE — Op Note (Signed)
NAME:  [name] Pace, [name] Pace NO.:  [id number]   MEDICAL RECORD NO.:  [id number]                   PATIENT TYPE:  INP   LOCATION:  0009                                 FACILITY:  [facility]   PHYSICIAN:  Rozanna Boer., M.D.      DATE OF BIRTH:  11-14-1950   DATE OF PROCEDURE:  12/03/2003  DATE OF DISCHARGE:                                 OPERATIVE REPORT   PREOPERATIVE DIAGNOSIS:  T2B Gleason 3+3 adenocarcinoma of the prostate.   POSTOPERATIVE DIAGNOSIS:  T2B Gleason 3+3 adenocarcinoma of the prostate,  pending pathology report.   OPERATION:  Bilateral pelvic lymph node dissection, radical retropubic  prostatectomy.   ANESTHESIA:  General.   SURGEON:  Courtney Paris, M.D.   ASSISTANT:  Maretta Bees. Vonita Moss, M.D.   PROCEDURE:  This patient was placed on the operating room table in the  dorsal lithotomy position in slight flexion after satisfactory induction of  general anesthesia.  He was prepped and draped in the usual sterile fashion.  A Foley catheter was inserted.  A midline incision was made from the  symphysis pubis to the umbilicus.  This was carried down from the midline to  open the midline fascia and exposed the retroperitoneum.  The  retroperitoneum was then opened, and using the Bookwalter retractor, the  wound was packed open.  The lymph nodes on the patient's right side were  first taken, beginning at the iliac and dissecting to and including the  obturator fossa back to the bifurcation of the iliac, clipping or tying  efferent lymphatics as they were encountered, the packet was then removed.  The obturator nerve was carefully preserved, as was a small vessel that was  running along with it.  The endopelvic fascia was then opened with a Foley  on the right side.  Then on the left side, the node packet in the obturator  fossa was similarly taken, beginning at the external iliac down to and  including the obturator fossa back to  the bifurcation of the iliac.  Again,  the efferent lymphatics were either clipped or tied as they were  encountered.  The endopelvic fascia was again incised on this side.  Next,  the puboprostatic ligaments were then cut sharply, and using a Hohenfellner  clamp passed underneath the dorsal vein packet two times, it was tied with  #1 Vicryl ties and then a back suture with 0 Vicryl suture was then used.  The dorsal vein packet was then divided with cautery, exposing the urethra.  With the Stamey retractor, the urethra was carefully dissected out, and the  nerve packet was then carefully preserved on either side.  A right angle  clamp was passed underneath the urethra, and it was elevated with an  umbilical tape as it was cut under direct vision at the apex of the  prostate.  After half the urethra was cut, a suture with 2-0 Vicryl outside  in was then passed at 10 o'clock on the patient's right side.  Next, the  inferior part of the urethra was then cut, as was the Foley catheter, as it  was pulled through and pulled anteriorly, exposed the back side of the  prostate.  Using the scissors, the Denonvilliers fascia was then incised and  the plane between the prostate and the rectum was then carefully developed.  This was brought up, and the neurovascular bundle on the patient's left side  was fairly well preserved.  The one on the right was a little flimsy but  also tried to be peeled off the prostate.  The pedicles were then taken off  the prostate close to the prostate up to the level of the seminal vesicles  bilaterally.  At this point, the bladder neck was then dissected.  Before  this was done, the midline over the vas was then opened, and the vas on each  side was found and clipped.  The seminal vesicles were not very large, and  it was felt to be easier to reach them after the bladder neck had been  divided.  The bladder neck was then carefully dissected with a tonsil clamp  using the  cautery and dissecting the urethra out of the prostate and then  dividing this under direct vision.  The prostate was then brought off the  bladder and exposing the tips of the seminal vesicles.  These were then  clipped after transecting them with a right angle clamp, and the prostate  was then removed.  The bladder neck was then reconstructed with interrupted  3-0 and 4-0 cat gut suture on the mucosae, inverting this to the bladder.  This effected good hemostasis and also left a nice stump of urethra on the  bladder neck.  Hemostasis appeared to be fairly dry.  I used the cautery to  stop a few small bleeders and then irrigated the pelvis with orthopedic bug  juice.   A Greenwald catheter sound was then passed through the urethra, and the rest  of the urethral sutures were placed on the urethral stump.  The first one at  7 o'clock and then one at 5 o'clock, 3 o'clock, and 12 o'clock, to go with  the one that had already been placed at 10 o'clock previously.  This was  done with a 2-0 Vicryl suture.  Then the sound was removed, and a #20 Foley  catheter was inserted through the urethra and placed inside the newly  created bladder neck.  The wound was insufflated to 15 cc.  The  corresponding urethral stitches were then placed on the bladder neck in the  corresponding position.  These were tied down, and the bladder was then  irrigated through the Foley and appeared to be water-tight.  There was no  leakage, and the irrigant from the bladder was clear.  A Jackson-Pratt drain  was brought through a separate stab incision on the patient's right side,  placing it in the retroperitoneum, sutured to the skin, and the midline  wound was closed with the running #1 PDS suture and clips for the skin.  Dressings were applied.  The cath was attached to a bag, and he was taken to  the recovery room in good condition.  Estimated blood loss was about 850 cc. Sponge and needle counts were correct on two  occasions.  Patient tolerated  this quite well.  He was sent to the recovery room, where he will be  observed for postoperative care.                                               Rozanna Boer., M.D.    HMK/MEDQ  D:  12/03/2003  T:  12/03/2003  Job:  [id number]

## 2010-10-22 NOTE — H&P (Signed)
NAME:  [name] Pace, [name] Pace NO.:  [id number]   MEDICAL RECORD NO.:  [id number]                   PATIENT TYPE:  INP   LOCATION:  3102                                 FACILITY:  [facility]   PHYSICIAN:  Genene Churn. Love, M.D.                 DATE OF BIRTH:  [birth year]   DATE OF ADMISSION:  01/23/2002  DATE OF DISCHARGE:                                HISTORY & PHYSICAL   PATIENT'S ADDRESS:  [address]  [place], [place] [address]   REASON FOR ADMISSION:  This is one of several [facility] admissions  for this 60 year old right-handed white single male from [place], [place] admitted from the emergency room for evaluation of multiple  seizures.   HISTORY OF PRESENT ILLNESS:  The patient has a known history of alcoholism  and began having seizures in his 37s.  This was thought to be related to  alcohol use but according to his family he was treated with Dilantin  medication at one point.  He has not had seizures in many years.  He does  continue to drink but his girlfriend by phone will not answer questions  about the amount of alcohol use.  He does drink beer.  His mother and father  indicate that he drinks beer.   The morning of admission EMT service was called to his house in the early  morning hours because he had a seizure and refused to come to the emergency  room.  He had a second seizure and EMT was recalled.  In route he had a  third seizure and in the emergency room he had four generalized major motor  seizures lasting 30 to 75 seconds.  He had a total of seven seizures.  He  received 3 mg of Ativan IV.  On my arrival he received 50 cc of D50W and 100  mg of thiamine. His capillary blood glucose had been 123. There were no  signs of trauma witnessed and there was no description of seizures except  grand mal times 30-75 seconds. Because of a low serum pH he received sodium  bicarb in the emergency room.   PAST  MEDICAL HISTORY:  His past medical history is significant for head  trauma 20 years ago when he fell off a scaffold. He is status post three  vessel coronary artery bypass surgery in June 2003 with LIMA to LAD,  saphenous vein graft to right coronary and saphenous vein graft to  circumflex. He has had a hiatal hernia with gastroesophageal reflux disease.  He has had COPD. Alcoholism. Status post right shoulder surgery. Status post  right and left knee surgery. History of infectious hepatitis. History of  left breast surgery. The family has been told that he was told by a doctor  recently that he had had a stroke in the past.   ALLERGIES:  PENICILLIN.   CURRENT MEDICATIONS:  1. Aspirin.  2. Prevacid 30 mg per day.  3. Combivent inhaler.  4. Norvasc.  5. Alprazolam 1 mg t.i.d.  6. Imdur.  7. Toprol XL 25 mg q.d.   SOCIAL HISTORY:  Educational history:  He went through high school. He  formerly worked as a Curator. He has been on disability for reasons which  are not clear, most likely secondary to his coronary artery disease. He  smokes cigarettes one pack per day. He has a girlfriend. He is not married.   FAMILY HISTORY:  His mother is 64 and has heart disease; had cancer of the  cervix. His father is 73 and living well. He has a brother 57 and living  well. He has one brother who died at 77 from alcoholism. Prior to this  admission he was last seen by his father one week ago and his mother he had  talked to [place] again by telephone 48 hours ago.   REVIEW OF SYMPTOMS:  He has complained recently of some chest pain where his  sternal incision has been present. According to his mother he has not been  following his postoperative instructions and has been driving a car and has  been doing some mechanical work which may have a put a strain on the chest.   PHYSICAL EXAMINATION:  GENERAL:  A well-developed, postictal white male.  Initially, he was comatose and there was no speech. He did  not follow  commands. He moved his right arm and leg to pain greater than his left.  VITAL SIGNS:  Blood pressure right and left arms 120/80, heart rate is 115,  respiratory rate 18, temperature 98.7 degrees rectally.  HEENT:  His pupils reacted from 4 to 3 mm bilaterally. He had full  extraocular movements by doll's eyes. Corneals were present. He grimaced on  the right and left. There was no blink to scare.  NEUROLOGIC:  He had decreased tone in the upper extremities and lower  extremities. He withdrew extremities to pain, but right greater than his  left. He had 2+ reflexes with absent ankle jerks. His plantar response is  upgoing on the right and possibly upgoing on the left.  TRUNK/CHEST:  He is status post right shoulder surgery, left breast surgery,  status post sternotomy. Chest scar tissue.  LUNGS:  Clear.  CARDIAC:  Examination of heart revealed no murmurs.  ABDOMEN:  His bowel sounds were normal.  GENITORECTAL:  He is circumcised. He had a Foley in place.  EXTREMITIES:  Status post bilateral knee surgery.   LABORATORY AND ACCESSORY DATA:  Sodium 137, potassium 4.1, chloride 109, BUN  5.0, CO2 6.0. Hemoglobin 20.0, hematocrit 60.0.  _______ 6.95. CT scan  showed old right basoganglia lacunar stroke.   IMPRESSION:  1. Recurrent seizures, Code 345.3.  2. Alcoholism, Code 303.91.  3. Coronary artery disease status post three vessel coronary artery bypass,     Code 429.3.  4. Hypertension, Code 796.2.  5. Chronic obstructive pulmonary disease, Code 496.  6. Hiatal hernia with gastroesophageal reflux disease.  7. Status post right shoulder surgery.  8. Status post knee surgery, right and left.  9. History of infectious hepatitis, Code 573.3.  10.     Allergy to PENICILLIN, Code 995.2.  11.      Old right basoganglia lacunar stroke, Code 434.1.   PLAN:  Plan at this time is to admit the patient to ICU after a Dilantin  load and continued  thiamine therapy.                                                 Genene Churn. Sandria Manly, M.D.    JML/MEDQ  D:  01/23/2002  T:  01/24/2002  Job:  [id number]

## 2010-10-22 NOTE — H&P (Signed)
NAME:  EMRIC, KOWALEWSKI NO.:  [id number]   MEDICAL RECORD NO.:  [id number]          PATIENT TYPE:  EMS   LOCATION:  MAJO                         FACILITY:  [facility]   PHYSICIAN:  Ulyses Amor, MD DATE OF BIRTH:  [birth year]   DATE OF ADMISSION:  06/01/2005  DATE OF DISCHARGE:                                HISTORY & PHYSICAL   HISTORY OF PRESENT ILLNESS:  [name] Pace is a 60 year old white man who was  admitted to [facility] for further evaluation of chest pain.   The patient has a history of coronary artery disease which dates back to  2003, at which time he underwent coronary artery bypass surgery.  He  received a left internal mammary artery graft to the LAD, a saphenous vein  graft to the right coronary artery, and a saphenous vein graft to the  circumflex.  Sometime thereafter, he underwent stent deployment in the right  coronary artery.  One week ago, he returned with unstable angina.  Stents  were deployed in the right coronary artery, as well as in the left  subclavian artery.  The patient was discharged without complications.  The  patient presents to the emergency room this evening with a history of  intermittent chest pain which began yesterday.  The chest pain is described  as a sharp and pressure discomfort in the left anterior chest, left neck,  and left shoulder.  Episodes occur at random and appear not to be related to  position, activity, meals, or respirations.  They last several minutes or  several hours at a time, resolve, and then recur.  He has not taken any  nitroglycerin.  There were no exacerbating or ameliorating factors.  There  has been no associated dyspnea, diaphoresis or nausea.  The patient's chest  pain has abated since presentation to the emergency department.   He does not have a history of congestive heart failure.  There was no  history of arrhythmia.   PAST MEDICAL HISTORY:  The patient has a number of other  medical problems  including -  1.  Dyslipidemia.  2.  Hypertension.  3.  Chronic obstructive pulmonary disease.  4.  Gastroesophageal reflux disease.  5.  He also has a history of prostate cancer.   MEDICATIONS:  1.  Zocor.  2.  Plavix.  3.  Metoprolol.  4.  Lisinopril.   ALLERGIES:  PENICILLIN.   OPERATIONS:  1.  Coronary artery bypass surgery.  2.  Bilateral knee surgery.  3.  Prostate surgery.  4.  Right shoulder surgery.   SIGNIFICANT INJURIES:  The patient has had a head injury in the remote past  which disabled him.   SOCIAL HISTORY:  The patient lives with his parents.  He stopped smoking 2-3  months ago.  He also had stopped drinking in the past.   FAMILY HISTORY:  His mother is alive in her 79s.  His father is also alive  in his 42s.  His mother has had cancer; his father has diabetes mellitus.   REVIEW OF SYSTEMS:  No new  problems related to his head, eyes, ears, nose,  mouth, throat, lungs, gastrointestinal system, genitourinary system or  extremities.  There is no history of neurologic or psychiatric disorder.  There is no history of fever, chills, or weight loss.   PHYSICAL EXAMINATION:  VITAL SIGNS:  Blood pressure 104/64, pulse 62 and  regular, respirations 18, temperature 98.4.  GENERAL:  The patient was a middle-aged white man in no discomfort.  He  appeared older than his stated age.  He was alert, oriented and responsive.  HEENT:  Head, eyes, nose and mouth were normal.  NECK:  Without thyromegaly or adenopathy.  Carotid pulses were palpable  bilaterally and without bruits.  CARDIAC:  A normal S1 and S2.  There was no S3, S4, murmur, rub or click.  Cardiac rhythm was regular.  Palpation of the left anterior chest wall  reproduced the sharp component of the patient's chest pain.  LUNGS:  Clear.  ABDOMEN:  Soft and nontender.  There was no mass, hepatosplenomegaly, bruit,  distention, rebound, guarding or rigidity.  Bowel sounds were normal.   RECTAL/GENITAL EXAMS:  Not performed, as they were not pertinent to the  reason for acute care hospitalization.  EXTREMITIES:  Without edema, deviation or deformity.  Radial and dorsalis  pedis pulses were palpable bilaterally.  Brief screening neurologic survey  was unremarkable.   Revealed normal sinus rhythm.  There was a mild nonspecific ST abnormality.  There were no changes specific for ischemia or infarction.  The chest  radiograph, according to the radiologist, demonstrated no evidence of acute  disease.   LABORATORY DATA:  Potassium of 3.8, BUN 9 and creatinine 1.0.  The initial  set of cardiac markers revealed a myoglobin of 50.7, CK-MB less than 1.0 and  troponin less than 0.05.  White count was 8.6 with a hemoglobin of 13.9 and  hematocrit of 39.0.  BNP was 32.4.  The remaining studies were pending at  the time of this dictation.   IMPRESSION:  1.  Chest pain; rule out unstable angina.  There is a musculoskeletal      component to his pain.  2.  Coronary artery disease.  Status post coronary artery bypass surgery.      Status post right coronary artery stents.  One week status post right      coronary artery and left subclavian stents.  3.  Dyslipidemia.  4.  Hypertension.  5.  Chronic obstructive pulmonary disease.  6.  Status post prostate cancer.  7.  Gastroesophageal reflux disease.   PLAN:  1.  Telemetry.  2.  Serial cardiac enzymes.  3.  Aspirin.  4.  Plavix.  5.  Intravenous heparin.  6.  Intravenous nitroglycerin.  7.  Metoprolol.  8.  Further measures per Dr. Allyson Sabal.      Ulyses Amor, MD  Electronically Signed     MSC/MEDQ  D:  06/01/2005  T:  06/01/2005  Job:  [id number]   cc:   Nanetta Batty, M.D.  Fax: [phone]

## 2010-10-22 NOTE — Op Note (Signed)
NAME:  [name] Pace, [name] Pace NO.:  [id number]   MEDICAL RECORD NO.:  [id number]          PATIENT TYPE:  INP   LOCATION:  6524                         FACILITY:  [facility]   PHYSICIAN:  Nanetta Batty, M.D.   DATE OF BIRTH:  July 10, 1950   DATE OF PROCEDURE:  DATE OF DISCHARGE:  05/25/2005                                 OPERATIVE REPORT   PROCEDURES:  1.  Arch aortogram.  2.  Selective right innominate and left subclavian artery angiogram.  3.  Percutaneous transluminal angioplasty and stent.   [name] Pace is a 60 year old gentleman status post coronary artery bypass  grafting in 2003.  He has positive cardiac risk factors including continued  tobacco use.  He was admitted with unstable angina and rule out by  isoenzymes.  He underwent catheterization by Nicki Guadalajara, M.D., revealing  an occluded RCA stent with high-grade disease at multiple places in his mid-  and distal right, which were stented.  He had a 90% left subclavian artery  stenosis with LIMA steal and left upper extremity claudication.  He presents  now for a PTA and stenting of his left subclavian artery for relief of  symptoms of LIMA steal and upper extremity claudication.   PROCEDURE DESCRIPTION:  The patient was brought to the sixth floor [facility] peripheral vascular angiographic suite in the postabsorptive state.  He  was premedicated with p.o. Valium.  His right groin was prepped and shaved  in the usual sterile fashion.  Xylocaine 1% was used for local anesthesia.  A 7 French long Terumo sheath was inserted into the right femoral artery  using standard Seldinger technique.  A long 5 French pigtail catheter was  used for arch angiography and a long 5 [place] JR-4 was used for selective  right innominate and left subclavian artery angiography.  Visipaque dye was  used for the entirety of the case.  Retrograde aortic pressure was monitored  throughout the case.   ANGIOGRAPHIC RESULTS:  1.  Arch  aortogram:  The patient had a bovine arch.  2.  Right innominate injection:  There was delayed retrograde fill of the      left vert via the right innominate injection.  The right vert appeared      to be dominant.  3.  Left subclavian artery:  A 90% proximal left subclavian artery stenosis.   It was noted after giving 2500 units of heparin intravenously that the  patient had some dye staining at the end of the sheath suggesting clot.  Clot was then aspirated through the sheath and therefore the entire sheath  and wire were exchanged for a new 7 [place] Cordis 90 cm Bright tip sheath  and Wholey and Glidewire  The patient received an additional 2500 units of  heparin intravenously, giving a total of 5500 units of heparin and an ending  ACT of 212.   Using a JR-4 and Glidewire, the proximal left subclavian artery stenosis was  crossed.  The Glidewire was exchanged for a long 0.035 Wholey wire and the  proximal left subclavian artery was angioplastied  with a 4 x 2 Powerflex.  It was ultimately stented with a 7 x 29 Genesis on Opta stent balloon pre-  mount at 10 atmospheres, resulting in reduction of a 90% proximal left  subclavian artery stenosis to 0% residual.  The patient did ultimately have  antegrade left vertebral flow and more brisk left internal mammary artery  flow.   The long sheath was exchanged for a short 7 sheath, which was sewn securely  in place.  Plans will be to remove the sheath once the ACT falls below 200.  Pressure will be held in the groin to achieve hemostasis.  The patient is  already on aspirin and Plavix.  He will be discharged home in the morning if  he remains clinically stable overnight.  Hemoglobin will be assessed and if it drops below 8, he will be transfused.  He was typed and crossed for two units of blood because of moderate amount  of blood loss during sheath exchange.  The patient left the lab in stable  condition.      Nanetta Batty, M.D.   Electronically Signed     JB/MEDQ  D:  05/24/2005  T:  05/26/2005  Job:  [id number]   cc:   Peripheral Vascular Angiographic Suite  Sixth Floor [facility]   [facility]  [address]  [place], [place] [address]

## 2010-10-22 NOTE — Discharge Summary (Signed)
NAME:  [name] Pace, [name] Pace NO.:  [id number]   MEDICAL RECORD NO.:  [id number]                   PATIENT TYPE:  INP   LOCATION:  3102                                 FACILITY:  [facility]   PHYSICIAN:  Genene Churn. Love, M.D.                 DATE OF BIRTH:  1950-08-01   DATE OF ADMISSION:  01/23/2002  DATE OF DISCHARGE:  01/25/2002                                 DISCHARGE SUMMARY   NEXT OF KIN:  [name] Pace, [address] [place], [place] [address]   CHIEF COMPLAINT:  This is one of multiple [name] Pace. [name] Pace  admissions for this 59 year old, right-handed white single male from  [place], [place], admitted from the emergency room for evaluation of multiple  seizures.   HISTORY OF PRESENT ILLNESS:  [name] Pace has a known history of alcoholism  and began having seizures in his 31s, with alcohol use.  He was treated with  Dilantin at one point in time.  He has not had any seizures in many years,  according to the patient.  He has been using cocaine and drinks about two to  three beers per day, he states.  On the morning of admission his girlfriend  witnessed a seizure early while he was in bed.  The EMS was called but he  refused to come to the Pace when the seizure was over.  They left, and  then he had another seizure.  The EMS was called again and picked him up at  his home, to take him to the emergency room.  He had another third seizure  en route.  In the [name] Pace Emergency Room he had four  witnessed grand mal generalized seizures, lasting for 45-75 seconds.  He was  not intubated.  He received a total of 3 mg of Ativan IV, 50 cc of D-50-W,  and 100 mg of thiamine, and one amp of bicarbonate.  His arrival capillary  blood glucose was 123.  There were no signs of trauma witnessed.   PAST MEDICAL HISTORY:  Significant for head trauma 20 years ago when he fell  off a scaffold.  He is status post three-vessel coronary  artery bypass graft  surgery in June 2003, with LIMA to LAD, saphenous vein graft to right  coronary artery, and saphenous vein graft to the circumflex.  He has had a  hiatal hernia with gastroesophageal reflux disease. chronic obstructive  pulmonary disease, and alcoholism.  He is status post right shoulder  surgery.  He is status post right and left knee surgery.  He has had a  history of infectious hepatitis.   ALLERGIES:  He has a history of known allergy to PENICILLIN.   MEDICATIONS:  (At time of discharge from [facility] in June 2003)  1. Aspirin 325 mg  q.d.  2. Pepcid 30 mg q.d.  3. Combivent  inhaler q.i.d.  4. Norvasc, dosage unknown.  5. Alprazolam 1 mg t.i.d.  6. Imdur, dosage unknown.  7. Toprol XL 25 mg q.d.   At the time of admission the patient states that he has been taken off of  all of his cardiac medications, and has been on Prevacid 30 mg  q.d.,  Combivent inhaler, aspirin 325 mg q.d., and alprazolam 1 mg b.i.d.   PHYSICAL EXAMINATION:  GENERAL:  At time of admission revealed a well-  developed thin postictal white male.  VITAL SIGNS:  Blood pressure in the right and left arm 120/80, heart rate  115, respirations 18, temperature 97.8 degrees rectally.  NEUROLOGIC:  He was comatose.  There was no speech.  He would not follow  commands.  Subsequently he began to move his right arm and leg to pain,  greater than the left.  His pupils were reactive from 4 to 3 bilaterally.  He had full extraocular movements by doll's eyes.  Corneals are present.  He  grimaced on the right and left.  There was no blink to stare.  His motor  exam revealed decreased tone in the upper and lower extremities.  He would  feel all extremities to pain, but less on the left than the right.  He had  no ankle jerks.  Plantar response was upgoing on he right and plus/minus on  the left.  EXTREMITIES:  He was status post right shoulder surgery.  He had scars to  both knees, suggestive that he had  had knee surgery.  He was status post  sternotomy surgery.  HEENT:  His tympanic membranes revealed scar tissue.  LUNGS:  Clear.  He had coarse rhonchi.  HEART:  Normal without any murmurs.  ABDOMEN:  Bowel sounds were normal.  GENITOURINARY:  He was circumcised and a Foley was in place.   LABORATORY DATA:  A serum sodium of 137, potassium 4.1, chloride 109, BUN 5,  glucose 149.  The pH was low at 6.950.   A CT scan of the brain showed old right basal ganglia ischemic stroke.  A  chest x-ray showed no radiographic evidence of aspiration pneumonitis.   An MRI study of the brain showed small chronic light basal ganglia  infarction, no acute infarction.  MRA was degraded by motion.  There were no  gross abnormalities.   Following a Dilantin load the phenytoin level was 12.3 on January 24, 2002.  Drug screen was positive for opiates and cocaine.  A urinalysis showed 0-2  white blood cells, 0-2 red blood cells, protein 100 mg%, ketones 15%,  moderate hemoglobin, yellow hazy appearance.  His initial phenytoin level on  January 23, 2002, was 19.8, with a repeat at 15.3.  CK was 137, CK-MB 2.2,  index 1.6, troponin 0.02.  No evidence of cardiac injury.  Repeat CK in the  Pace 293 with MB of 4.7 which was hemolyzed, though the __________  was  1.6 and troponin was 0.3.  His white blood cell count was high at 23,600,  with hemoglobin of 17.5, hematocrit 52.2, platelet count 277,000, 87% polys,  8% lymphs, 4% monocytes, 1% eosinophils.  PT 12.9, INR 0.9, PTT 20.  Sodium  137, potassium 4.1, chloride 109.  Repeats in the Pace were all in the  normal range.  Initial CO2 was 10, but repeat CO2 was 26.  His liver  function tests were all in the normal range.  Amylase was high at 260.   His electrocardiogram showed sinus  bradycardia, nonspecific ST-T wave  changes, considered an abnormal electrocardiogram.  There were lateral T-  wave changes noted.  EEG showed mild diffuse slowing without  any focal abnormalities, and without  any evidence of epileptiform activity present.   Pace COURSE:  The patient received a Dilantin load in the emergency room  and had no further seizures, with a total of seven, two at home, one en  route, and four at [facility] having occurred.  These  were generalized major motor seizures lasting for approximately 1/2 to one  minute.  He had therapeutic levels of Dilantin in the Pace.  He was  found to have evidence of cocaine use.  His left-sided weakness at the time  of arrival all improved, and was felt to be secondary to the old white basal  ganglia stroke.  There was no evidence of an acute stroke on the DWI.  A  consideration of alcohol rehabilitation and drug rehab was discussed with  the patient.  He was willing to undergo this.  In the Pace his  swallowing improved.  His speech cleared, and he became alert and oriented  x3 and had essentially a nonfocal neurological exam.  He reported that his  cardiac medicines had been discontinued following his discharge from the  Pace by Dr. Deloris Ping. Nahser.   PENDING STUDIES:  Pending studies are Doppler study of the carotids, and to  check his walking and be sure that he can walk well.   IMPRESSION:  1. Status epilepticus - code #345.3.  2. Alcoholism - code #303.91.  3. Coronary artery disease, status post three-vessel coronary artery bypass     graft surgery in June 2003, - code #429.2.  4. Hypertension - code #796.2.  5. Chronic obstructive pulmonary disease - code #496.  6. Hiatal hernia with gastroesophageal reflux disease.  7. Status post right shoulder surgery.  8. Status post right and left knee surgery.  9. History of infectious hepatitis - code #573.3.  10.      Allergy to PENICILLIN - code #995.2.  11.      Old right basal ganglia ischemic stroke - code 434.01.  12.      Cocaine use - code #305.61.   PLAN:  To discharge the patient on seizure  precautions.  He is willing to go  to a rehab center on Monday.  He is discharged improved from his pre-  Pace status.  Not to drive a car.  This was discussed at length with  him.   DISCHARGE MEDICATIONS:  1. Aspirin 325 mg q.d.  2. Prevacid 30 mg q.d.  3. Dilantin 300 mg q.d.  4. Xanax 1.0 mg b.i.d.  5. Combivent inhaler q.i.d.   FOLLOW UP:  He will see Boneta Lucks, our physician's associate, in the  office in three weeks.                                                  Genene Churn. Sandria Manly, M.D.    JML/MEDQ  D:  01/25/2002  T:  01/28/2002  Job:  [id number]   cc:   Merlene Laughter. Renae Gloss, M.D.

## 2010-10-22 NOTE — Discharge Summary (Signed)
NAME:  [name] Pace, [name] Pace NO.:  [id number]   MEDICAL RECORD NO.:  [id number]          PATIENT TYPE:  INP   LOCATION:  3735                         FACILITY:  [facility]   PHYSICIAN:  [name] Scott, MD     DATE OF BIRTH:  13-Dec-1950   DATE OF ADMISSION:  02/10/2006  DATE OF DISCHARGE:  02/13/2006                                 DISCHARGE SUMMARY   Primary Care Physician: [name] Pace   DISCHARGE DIAGNOSES:  1. Atypical chest pain/epigastric pain.  2. Coronary artery disease, status post coronary artery bypass grafting      and percutaneous coronary intervention.  3. Hypertension.  4. Dyslipidemia.  5. History of prostate cancer, status post surgery.  6. Chronic obstructive pulmonary disease.  7. Peripheral artery disease.   DISCHARGE MEDICATIONS:  1. Aspirin 81 mg p.o. daily.  2. Plavix 75 mg p.o. daily.  3. Imdur 30 mg p.o. daily.  4. Metoprolol 12.5 mg p.o. twice a day.  5. Simvastatin 20 mg p.o. q. h.s.  6. Folic acid 1 p.o. daily.  7. Multivitamin 1 tablet p.o. daily.  8. Protonix 40 mg p.o. daily.   PROCEDURES DONE DURING COURSE OF ADMISSION:  1. An ultrasound of the abdomen which was done on the 8th of September      2007 with findings of:      a.     Status post cholecystectomy.      b.     There is no acute abdominal findings seen and this study is       limited by intestinal bowel gas.  2. Chest x-ray which was done on the 7th of September 2007 with the      impression of:      a.     No acute active cardiopulmonary disease.      b.     There is no evidence of heart failure.   CONDITION ON DISCHARGE:  The patient has done very well and is pain free and  stable to be discharged and followed up as an outpatient by the primary care  physician as well as the patient's cardiologist.   HOSPITAL COURSE:  #1:  For details of the history and physical examination  of this admission, please see note of Dr. Della Pace on the 8th of  September 2007.   In summary, [name] is a 60 year old male with  significant coronary artery disease, status post coronary artery bypass  graft and PCI who presented to the emergency department with complaints of  chest pain, epigastric pain, and right upper quadrant pain.  He was admitted  to the telemetry unit to rule out an acute coronary syndrome.  The chest  pain was thought to be an atypical type of chest pain.  Serial troponins and  cardiac markers x3 were negative.   Cardiology was consulted and was seen by Dr. __________ from [facility].  The patient did well.  The patient was continued on  aspirin and Plavix.  He was started on a nitroglycerin patch, beta blockers  were continued, and  he was also placed on GI prophylaxis with Protonix.  Gradually the patient's pain subsided and he has been pain-free for more  than 24 hours and is stable to be discharged to be followed up as an  outpatient with his cardiologist, Dr. Nanetta Pace and primary care  physician, Dr. Andi Pace.  He is to call their offices and make an  appointment to see them this week.  For the details of the coronary artery  bypass grafting as well as the PCI, please see the cardiology consult note.   His atypical chest pain/epigastric pain was thought to be of a  musculoskeletal or gastric etiology.  His liver function tests were found to  be normal.  His lipase was normal.  He had minimal epigastric tenderness  initially which subsequently resolved.  There is no recall of any previous  echo in this hospital and one was not done during this admission.   #2:  Coronary artery disease, status post coronary artery bypass grafting  and PCI.- Presentation and management per #1.  The patient was continued on  his aspirin, Plavix, beta blockers, and statin and imdur were added.  As  indicated earlier, myocardial infarction was ruled by three negative  troponins and cardiac enzymes.  Cardiology was  consulted and evaluated in-  hospital.   #3:  His hypertension is controlled and he is to continue with the  metoprolol.   #4:  Dyslipidemia.  Zocor has been started and he is to follow up with the  primary care physician for further lipid testing as well as muscles enzymes  if symptomatic.   #5:  History of prostate surgery.   #6:  Chronic obstructive pulmonary disease, stable.  He has been advised to  quit smoking and counseled to quit smoking.   #7:  Peripheral vascular disease, stable.    CC Satira Sark, MD  Electronically Signed     AH/MEDQ  D:  02/13/2006  T:  02/13/2006  Job:  [id number]   cc:   Merlene Laughter. Renae Gloss, M.D.  [name] Pace, M.D.

## 2010-10-22 NOTE — Discharge Summary (Signed)
NAME:  [name] Pace, [name] Pace NO.:  [id number]   MEDICAL RECORD NO.:  [id number]          PATIENT TYPE:  INP   LOCATION:  6526                         FACILITY:  [facility]   PHYSICIAN:  Nanetta Batty, M.D.   DATE OF BIRTH:  06/18/1950   DATE OF ADMISSION:  05/31/2005  DATE OF DISCHARGE:  06/02/2005                                 DISCHARGE SUMMARY   DISCHARGE DIAGNOSES:  1.  Chest pain, probably musculoskeletal in origin with myocardial      infarction ruled out this admission.  2.  Coronary disease with recent saphenous vein graft to right coronary      artery stenting May 23, 2005, and recent left subclavian artery      stenting May 25, 2005.  3.  History of coronary artery bypass grafting in 2003.  4.  Hypertension.  5.  Chronic obstructive pulmonary disease.  6.  Dyslipidemia.   HOSPITAL COURSE:  The patient is a 60 year old male with peripheral vascular  disease and coronary disease as noted above. Recently, he had intervention  to the left subclavian artery as well as the RCA. The patient presented to  the emergency room June 01, 2005, with intermittent chest pain. He  described this as sharp as well as aching along the left anterior chest, the  left neck and left shoulder. It is not related to activity. He was seen by  Dr. Waldon Reining and admitted for 24-hour observation. Enzymes were negative. He  was seen in consult by the neurosurgical group, Dr. Colon Branch. It is  suspected he has significant DJD of the C-spine area. He cannot have an MRI  because of his recent stent placement. Plan is for antiinflammatories and  steroid dose pack and follow-up with Dr. Phoebe Perch if he does not improve. We  feel he can be discharged December 28.   DISCHARGE MEDICATIONS:  1.  Coated aspirin daily.  2.  Plavix 75 mg a day.  3.  Metoprolol 25 mg twice a day.  4.  Lisinopril 10 mg a day.  5.  Zocor 20 mg a day.  6.  Xanax 1 mg t.i.d. as needed.  7.  Prilosec OTC  once a day.  8.  Medrol Dosepak 6 days as directed.  9.  Aleve twice a day for 7 days.  10. Darvocet-N 100 as directed.  11. Flexeril 10 mg three times a day as needed.   LABORATORY:  White count 9.4, hemoglobin 13.7, hematocrit 38.4, platelets  391. Sodium 139, potassium 4.2, BUN 6, creatinine 1.0. Liver functions are  normal. Enzymes are negative x2. D-dimer is less than 0.22. BNP is 32. Chest  x-ray from December 26 shows no active disease INR 1.01.   DISPOSITION:  The patient is discharged in stable condition and will follow  up with Dr. Allyson Sabal in a few weeks. He also knows to contact Dr. Phoebe Perch for  further evaluation. He may need physical therapy, but we felt it would be  best to proceed with symptomatic treatment at this time.      Abelino Derrick, P.A.  Nanetta Batty, M.D.  Electronically Signed    LKK/MEDQ  D:  06/02/2005  T:  06/02/2005  Job:  [id number]   cc:   Clydene Fake, M.D.  Fax: [phone]

## 2010-10-22 NOTE — Discharge Summary (Signed)
NAME:  [name] Pace, [name] Pace NO.:  [id number]   MEDICAL RECORD NO.:  [id number]                   PATIENT TYPE:  INP   LOCATION:  0345                                 FACILITY:  [facility]   PHYSICIAN:  Rozanna Boer., M.D.      DATE OF BIRTH:  1950/10/09   DATE OF ADMISSION:  12/03/2003  DATE OF DISCHARGE:  12/06/2003                                 DISCHARGE SUMMARY   DISCHARGE DIAGNOSES:  1. T2c Gleason 3 + 3 adenocarcinoma of the prostate.  2. Coronary artery disease, status post coronary artery bypass graft March     2003.  3. Prolonged Al Pimple drainage.   OPERATION AND PROCEDURE:  Radical retropubic prostatectomy and bilateral  pelvic sentinel node dissection on December 03, 2003.   BRIEF HISTORY:  This 60 year old white male was admitted with prostate  cancer for radical prostatectomy and had his first and only PSA in February  which was elevated at 4.16.  This was repeated when still elevated and had a  transrectal ultrasound and biopsy of his prostate which showed a clinical  T2b Gleason 3 + 3 adenocarcinoma of the prostate.  He had 10% positive  biopsies on each side and high-grade PIN on both.  He has had no change in  size or force of his stream and no irritative symptoms and has never had a  UTI or hematuria.  He was presented the options of surgery versus radiation  and expectant management and chose to go ahead with radical surgery,  understanding the risks including but not limited to incontinence,  impotence, deep vein thrombosis, pulmonary emboli, bleeding, and death.   MEDICATIONS:  He only took Xanax b.i.d. p.r.n. plus aspirin which was  discontinued a week before surgery.   ALLERGIES:  PENICILLIN.   PAST SURGICAL HISTORY:  CABG x3 in March 2003, this was done for unstable  angina, he has never had a MI.   LABORATORY DATA:  Normal values, his hematocrit was 49% on admission, white  count 9600.  His BUN was 9, his creatinine  0.7.   He was a two pack a day smoker and his chest x-ray did show some chronic  COPD.  EKG was nonspecific ST abnormalities, otherwise negative.   HOSPITAL COURSE:  After satisfactory preop evaluation he underwent radical  retropubic prostatectomy and bilateral pelvic lymph node dissection on December 03, 2003.  This was without complication.  His postop lab values, his  hematocrit dropped to around 30% but stayed there and he never received  transfusions.  His creatinine was stable at 0.8 as were his electrolytes.  The JP drainage, however, persisted and was more than 30 cc on the third  postoperative day when he was ready to go home.  He was on a regular diet  and having normal bowel movements at that time.  Dr. Earlene Plater discharged him  with the JP drain in place with instructions on how  to drain this and also  his Foley catheter.  He was sent home and is to come back to the office in 2  days for followup and removal of JP drainage if needed.  Pathology showed a  T2c Gleason 3 + 3 adenocarcinoma of the prostate that involved both lobes,  capsular extension was not present, the margins were free, seminal vesicles  were free, the nodes were negative.  He had high-grade PIN throughout and  there was some focal perineural invasion by tumor, about 10% of the prostate  exam and histologically was involved by cancer.   He was sent home in improved ambulatory condition and on a regular diet with  instructions to come back to the office as above.                                               Rozanna Boer., M.D.    HMK/MEDQ  D:  12/24/2003  T:  12/24/2003  Job:  [id number]

## 2010-10-22 NOTE — Op Note (Signed)
[place]  Patient:    [name] Pace, [name] Pace Visit Number: [id number] MRN: [id number]          Service Type: SUR Location: 2300 [id number] Attending Physician:  Mikey Bussing Dictated by:   Mikey Bussing, M.D. Proc. Date: 11/08/01 Admit Date:  11/08/2001   CC:         CVTS office  [facility], Attn:  Dr. Nanetta Batty   Operative Report  PREOPERATIVE DIAGNOSIS:  Class 4 progressive angina with severe three vessel coronary artery disease.  POSTOPERATIVE DIAGNOSIS:  Class 4 progressive angina with severe three vessel coronary artery disease.  OPERATION:  Coronary artery bypass grafting x3 (left internal mammary artery to LAD, saphenous vein graft to obtuse marginal, saphenous vein graft to right coronary artery).  SURGEON:  Mikey Bussing, M.D.  ASSISTANT:  Lissa Merlin, P.A.-C.  ANESTHESIA:  General by Maren [place], M.D.  INDICATIONS:  The patient is a 60 year old male smoker with strong family history of coronary disease who presented with symptoms of angina.  Cardiac catheterization by Dr. Nanetta Batty demonstrated severe three vessel coronary disease including 95% stenosis of the right coronary, 95% of the LAD, and occlusion of the circumflex.  He was felt to be a candidate for surgical coronary revascularization.  Prior to the operation, I examined the patient in the office, and reviewed the results of the cardiac cath with the patient.  I discussed the indications and expected benefits of coronary artery bypass surgery for treatment of his coronary artery disease with the patient at that time.  I reviewed the major aspects of the surgical procedure including the choice of conduit for grafting, the location of the surgical incisions, the use of general anesthesia and cardiopulmonary bypass, and the expected postoperative hospital recovery.  I discussed with the patient the alternatives to  surgical therapy for treatment of his coronary artery disease. I reviewed with the patient the risks to him of coronary artery bypass surgery including the risks of MI, CVA, bleeding, infection, blood transfusion requirement, and death.  He understood these aspects of the proposed operation and agreed to proceed under what I felt was an informed consent.  OPERATIVE FINDINGS:  The patient has a severe chronic lung disease.  The saphenous vein was harvested from the right thigh using endoscopic technique. The vein was of average quality.  The mammary artery was somewhat small, but had good flow.  The right mammary artery was not harvested due to its small size and the size discrepancy with the large right coronary.  No blood requirements or transfusions were needed.  DESCRIPTION OF PROCEDURE:  The patient was brought to the operating room and placed supine on the operating room table where general anesthesia was induced under invasive hemodynamic monitoring.  The chest, abdomen, and legs were prepped with Betadine, and draped as a sterile field.  A sternal incision was made as the saphenous vein was harvested from the right thigh.  The internal mammary artery was harvested from the left side as a pedicle graft from its origin at the subclavian vessels.  Heparin was administered and the ACT was documented as being therapeutic.  A sternal retractor was placed.  The pericardium was opened.  Three pursestrings were placed in the ascending aorta and right atrium.  The patient was cannulated and placed on bypass and cooled to 32 degrees.  The coronaries were identified for grafting and the mammary artery and vein  grafts were prepared for the distal anastomoses.  A cardioplegia cannula was placed.  As the aortic cross-clamp was applied, 500 cc of cold blood cardioplegia was delivered to the aortic root with immediate cardioplegia arrest and septal temperature dropping less than 12 degrees.   Topical ice saline slush was used to augment myocardial preservation and a pericardial insufflator pad was used to protect the left phrenic nerve.  The distal coronary anastomoses were then performed.  The first distal anastomosis was to the distal right coronary.  This is a 2.0 mm vessel with proximal 95% stenosis.  A reversed saphenous vein was sewn end-to-side with a running 7-0 Prolene with good flow through the graft.  The second distal anastomosis was to the circumflex marginal.  This was a 1.5 mm vessel with a proximal occlusion.  A reversed saphenous vein was sewn end-to-side with a running 7-0 Prolene with good flow through the graft. Cardioplegia was redosed.  The third distal anastomosis was to the distal third of the LAD.  This was a 1.5 mm vessel with proximal 95% stenosis.  The left internal mammary artery pedicle was brought through an opening created in the left lateral pericardium.  It was brought down on to the LAD and sewn end-to-side with a running 8-0 Prolene.  There was excellent flow through the anastomosis with immediate rise in the septal temperature after release of the pedicle clamp on the mammary artery.  The mammary and pedicle was secured to the epicardial and the aortic cross-clamp was removed.  The heart resumed a spontaneous rhythm.  Using a partial occluding clamp, two proximal vein anastomoses were placed on the ascending aorta using a 4.0 mm punch and a running 6-0 Prolene.  The partial clamp was removed and the vein grafts were perfused.  Each had excellent flow was hemostasis which was documented at the proximal and distal sites.  The patient was rewarmed and reperfused.  Temporary pacing wires were applied.  When the patient reached 37 degrees, the lungs were reexpanded and the ventilator was resumed.  The patient was then weaned from bypass without difficulty with stable blood pressure and cardiac output.  Protamine was administered.  There was  no  adverse reaction to the Protamine.  The cannulas were removed.  The mediastinum was irrigated with warm antibiotic irrigation.  The leg incision was irrigated and closed in the standard fashion.  The pericardium was reapproximated.  Two mediastinal and a left pleural chest tube were placed and brought out through separate incisions.  The sternum was reapproximated with interrupted steel wire.  The pectoralis fascia was closed with interrupted #1 Vicryl.  The subcutaneous and skin were closed with a running Vicryl. Sterile dressings were applied.  Total bypass time was 80 minutes with aortic cross-clamp time of 40 minutes. Dictated by:   Mikey Bussing, M.D. Attending Physician:  Mikey Bussing DD:  11/08/01 TD:  11/11/01 Job: [id number]

## 2010-10-22 NOTE — H&P (Signed)
NAME:  [name] Pace, [name] Pace NO.:  [id number]   MEDICAL RECORD NO.:  [id number]          PATIENT TYPE:  INP   LOCATION:  3735                         FACILITY:  [facility]   PHYSICIAN:  Della Goo, M.D. DATE OF BIRTH:  [birth year]   DATE OF ADMISSION:  02/10/2006  DATE OF DISCHARGE:                                HISTORY & PHYSICAL   PRIMARY CARE PHYSICIAN:  Dr. Merlene Laughter. [place].   CARDIOLOGIST:  [facility], Dr. Nanetta Batty.   CHIEF COMPLAINT:  Chest pain, epigastric pain and right upper quadrant  abdominal pain.   HISTORY OF PRESENT ILLNESS:  This is a 60 year old male, with a history of  coronary artery disease, status post coronary artery bypass grafting and  previous PTCA with LAD stenting, who presented to the emergency department  with a complaint of chest pain which was described as being substernal, also  epigastric pain and right upper quadrant pain.  The patient reported the  pain had started the day before in the evening and started as an  intermittent sharp pain in the chest and was sudden in onset.  As time went  on, the pain became constant and the patient presented to the emergency  department.  The patient reports symptoms of his pain were associated with  shortness of breath, diaphoresis, nausea and vomiting.  He denies having any  fevers or chills or syncope associated with this.   The patient also reported that the pain was different from his previous MI.  Cardiology initially evaluated the patient and felt symptoms were noncardiac  at this time; however, cardiac enzymes were ordered to continue and the  patient was to be evaluated to rule out a myocardial infarction.  His home  medications only included 75 mg 1 p.o. daily.   ALLERGIES:  PENICILLIN.   PAST SURGICAL HISTORY:  As mentioned above.  Also, the patient has a history  of cholecystectomy in 2005.   SOCIAL HISTORY:  The patient does smoke cigarettes and drinks occasionally,  reports drinking a beer daily.   REVIEW OF SYSTEMS:  As mentioned above.   PHYSICAL EXAMINATION:  This is a 60 year old overweight male in discomfort  but no acute distress.  VITAL SIGNS:  Temperature 98.9, blood pressure 132/87, heart rate 74,  respirations 24.  O2 saturations 93-97%.  HEENT EXAMINATION:  Normocephalic and atraumatic.  Pupils are equal, round,  and reactive to light.  Extraocular muscles are intact.  Oropharynx clear.  NECK:  Supple.  Full range of motion.  No jugular venous distention.  CARDIOVASCULAR:  Regular rate and rhythm.  No murmurs, gallops, or rubs  visible.  Midsternal surgery scar, old and well healed present.  LUNGS:  Clear to auscultation bilaterally.  ABDOMEN:  Positive bowel sounds, soft and nontender except in the right  upper quadrant.  There is no rebound or guarding.  EXTREMITIES:  Without clubbing, cyanosis, or edema.  RECTAL:  Deferred.  GENITOURINARY:  Deferred.  NEUROLOGICAL EXAMINATION:  The patient is alert and oriented x3.  Speech is  clear.  Cranial nerves are intact.  His sensory and motor  function are  intact.   LABORATORY DATA:  Reveal a white blood cell count of 11.4, hemoglobin 18.1,  hematocrit 52.7, platelet count of 293,000.  MCV 98.9, neutrophils 80%,  lymphocytes 14%.  Sodium 137, potassium 4.3, chloride 109, BUN 5, creatinine  1.0, glucose 92.  Initial emergent cardiac enzymes revealed a myoglobin of  56.3, CK-MB of 1.2, troponin less than 0.05.  Hepatic panel reveals an  albumin of 3.1, a total bilirubin of 0.8, direct bilirubin of 0.2, indirect  bilirubin 0.6, AST 20, ALT 17, alkaline phosphate 72.  Lipase level 15.  Second myocardial enzymes revealed a myoglobin of 46.4, CK-MB of less than  1.0 and a troponin less than 0.05.  Chest x-ray revealed no active disease.  Ultrasound of the abdomen performed results of which reveal a  cholecystectomy and no acute abdominal changes.   ASSESSMENT:  A 60 year old male with atypical  chest pain, epigastric and  right upper quadrant abdominal pain.   PROBLEM LIST:  1. Atypical chest pain.  2. Abdominal pain.  3. History of coronary artery disease.  4. Erythrocytosis.  5. Mild leukocytosis.  6. Tobacco abuse.  7. Alcohol usage.   PLAN:  The patient has been admitted to further evaluate his chest and  abdominal pain.  He has been admitted to telemetry and will have cardiac  enzymes performed q.8h. x3.  The patient has been placed on nitroglycerin  therapy, aspirin therapy and Lovenox therapy for now.  He is also on nasal  cannula oxygen.  The patient has also been placed on Lovenox and GI  prophylaxis.  Pain medications have been ordered as well to help control his  pain and discomfort.  Further evaluation pending results of laboratory  studies and cardiac enzymes.  The patient will also receive gentle  rehydration therapy.      Della Goo, M.D.  Electronically Signed     HJ/MEDQ  D:  02/11/2006  T:  02/11/2006  Job:  [id number]

## 2010-10-22 NOTE — H&P (Signed)
NAME:  [name] Pace, [name] Pace NO.:  [id number]   MEDICAL RECORD NO.:  [id number]                   PATIENT TYPE:  INP   LOCATION:  NA                                   FACILITY:  [facility]   PHYSICIAN:  Rozanna Boer., M.D.      DATE OF BIRTH:  [birth year]   DATE OF ADMISSION:  12/03/2003  DATE OF DISCHARGE:                                HISTORY & PHYSICAL   This 60 year old white male is admitted with prostate cancer for radical  prostatectomy.  He had his first and only PSA in February, which was  elevated at 4.16.  This was repeated and then when it was still elevated, he  had a transrectal ultrasound and biopsy of his prostate, which showed a T2b  Gleason 3 + 3 adenocarcinoma of the prostate with 10% of each side with  cancer and high grade PIN on both.  He has had no change in size or force of  his stream.  No irritative symptoms.  He has never had a UTI or hematuria.  He enters understanding the risks including, but not limited to,  incontinence, impotence, deep vein thrombosis, pulmonary emboli, bleeding  and death for radical surgery.   PAST MEDICAL HISTORY:  He takes Xanax b.i.d. as needed plus aspirin, which  has been discontinued.   He has allergies to PENICILLIN.   PAST SURGICAL HISTORY:  CABG times three, March 2003.   REVIEW OF SYSTEMS:  The CABG was done for unstable angina.  He has never had  an MI.  No bowel problems.  No melena.  No pulmonary symptoms.  He smokes  two packs of cigarettes per week; occasional alcohol.  No change in bowel  habits.  He does have a disability from a head injury when he fell from a  scaffold in 1989.   FAMILY HISTORY AND SOCIAL HISTORY:  He is divorced.  He has a 74 year old  daughter.  Both parents are still living and well.  His father has diabetes,  but no family history of carcinoma of the prostate.  Father is 70 and mother  is 76.  No kidney stones, no kidney disease or other heart disease.   PHYSICAL EXAMINATION:  VITAL SIGNS:  Temperature is 99; pulse 78; blood  pressure 116/85.  GENERAL:  The patient is a thin, young, white male in no acute distress.  HEENT:  Clear.  NECK:  Supple.  Oropharynx is clear.  HEART SOUNDS:  Without murmurs.  LUNGS:  Clear to auscultation.  ABDOMEN:  Soft.  Liver and spleen nonpalpable.  No masses, no CVA pain.  GENITOURINARY:  His penis is circumcised, normal without lesions.  Some  slightly atrophic testes.  Epididymides nontender.  His prostate is about 35  grams.  It is not fixed or indurated.  SV is not palpable.  No induration.  No rectal masses.  EXTREMITIES:  No edema.  Good distal pulses.  Intact sensation  to light  touch.   IMPRESSION:  1. T2b Gleason 3 + 3 adenocarcinoma of the prostate.  2. Elevated PSA.  3. Coronary artery disease, status post coronary artery bypass grafting     March 2003.   PLAN:  Retropubic prostatectomy and bilateral pelvic lymph node dissection  as discussed.                                               Rozanna Boer., M.D.    HMK/MEDQ  D:  12/03/2003  T:  12/03/2003  Job:  [id number]

## 2010-10-25 ENCOUNTER — Emergency Department (HOSPITAL_COMMUNITY)
Admission: EM | Admit: 2010-10-25 | Discharge: 2010-10-25 | Discharge status: Against Medical Advice | Payer: Medicare Other | Attending: Emergency Medicine | Admitting: Emergency Medicine

## 2010-10-25 DIAGNOSIS — R079 Chest pain, unspecified: Secondary | ICD-10-CM | POA: Insufficient documentation

## 2010-10-29 ENCOUNTER — Emergency Department (HOSPITAL_COMMUNITY)
Admission: EM | Admit: 2010-10-29 | Discharge: 2010-10-29 | Discharge status: Against Medical Advice | Payer: Medicare Other | Attending: Emergency Medicine | Admitting: Emergency Medicine

## 2010-10-29 DIAGNOSIS — R079 Chest pain, unspecified: Secondary | ICD-10-CM | POA: Insufficient documentation

## 2010-10-29 DIAGNOSIS — R0602 Shortness of breath: Secondary | ICD-10-CM | POA: Insufficient documentation

## 2010-11-15 ENCOUNTER — Inpatient Hospital Stay (HOSPITAL_COMMUNITY)
Admission: EM | Admit: 2010-11-15 | Discharge: 2010-11-17 | DRG: 392 | Disposition: A | Discharge status: Home / Self Care | Payer: Medicare Other | Attending: Internal Medicine | Admitting: Internal Medicine

## 2010-11-15 ENCOUNTER — Emergency Department (HOSPITAL_COMMUNITY): Payer: Medicare Other

## 2010-11-15 DIAGNOSIS — Z7982 Long term (current) use of aspirin: Secondary | ICD-10-CM

## 2010-11-15 DIAGNOSIS — E872 Acidosis, unspecified: Secondary | ICD-10-CM | POA: Diagnosis present

## 2010-11-15 DIAGNOSIS — Z7902 Long term (current) use of antithrombotics/antiplatelets: Secondary | ICD-10-CM

## 2010-11-15 DIAGNOSIS — R0789 Other chest pain: Secondary | ICD-10-CM | POA: Diagnosis present

## 2010-11-15 DIAGNOSIS — A09 Infectious gastroenteritis and colitis, unspecified: Principal | ICD-10-CM | POA: Diagnosis present

## 2010-11-15 DIAGNOSIS — J4489 Other specified chronic obstructive pulmonary disease: Secondary | ICD-10-CM | POA: Diagnosis present

## 2010-11-15 DIAGNOSIS — K449 Diaphragmatic hernia without obstruction or gangrene: Secondary | ICD-10-CM | POA: Diagnosis present

## 2010-11-15 DIAGNOSIS — F17200 Nicotine dependence, unspecified, uncomplicated: Secondary | ICD-10-CM | POA: Diagnosis present

## 2010-11-15 DIAGNOSIS — J449 Chronic obstructive pulmonary disease, unspecified: Secondary | ICD-10-CM | POA: Diagnosis present

## 2010-11-15 DIAGNOSIS — Z8546 Personal history of malignant neoplasm of prostate: Secondary | ICD-10-CM

## 2010-11-15 DIAGNOSIS — D126 Benign neoplasm of colon, unspecified: Secondary | ICD-10-CM | POA: Diagnosis present

## 2010-11-15 DIAGNOSIS — Z951 Presence of aortocoronary bypass graft: Secondary | ICD-10-CM

## 2010-11-15 DIAGNOSIS — I2510 Atherosclerotic heart disease of native coronary artery without angina pectoris: Secondary | ICD-10-CM | POA: Diagnosis present

## 2010-11-15 DIAGNOSIS — E860 Dehydration: Secondary | ICD-10-CM | POA: Diagnosis present

## 2010-11-15 DIAGNOSIS — Z853 Personal history of malignant neoplasm of breast: Secondary | ICD-10-CM

## 2010-11-15 LAB — DIFFERENTIAL
Basophils Absolute: 0 10*3/uL (ref 0.0–0.1)
Basophils Relative: 0 % (ref 0–1)
Eosinophils Absolute: 0 10*3/uL (ref 0.0–0.7)
Eosinophils Relative: 0 % (ref 0–5)
Lymphocytes Relative: 4 % — ABNORMAL LOW (ref 12–46)
Lymphs Abs: 0.7 10*3/uL (ref 0.7–4.0)
Monocytes Absolute: 0.9 10*3/uL (ref 0.1–1.0)
Monocytes Relative: 6 % (ref 3–12)
Neutro Abs: 13.2 10*3/uL — ABNORMAL HIGH (ref 1.7–7.7)
Neutrophils Relative %: 89 % — ABNORMAL HIGH (ref 43–77)

## 2010-11-15 LAB — COMPREHENSIVE METABOLIC PANEL
Albumin: 4.4 g/dL (ref 3.5–5.2)
Alkaline Phosphatase: 101 U/L (ref 39–117)
BUN: 18 mg/dL (ref 6–23)
Potassium: 4.3 mEq/L (ref 3.5–5.1)
Total Protein: 8.4 g/dL — ABNORMAL HIGH (ref 6.0–8.3)

## 2010-11-15 LAB — LIPASE, BLOOD: Lipase: 21 U/L (ref 11–59)

## 2010-11-15 LAB — CBC
HCT: 54.7 % — ABNORMAL HIGH (ref 39.0–52.0)
Hemoglobin: 20.2 g/dL — ABNORMAL HIGH (ref 13.0–17.0)
MCH: 35.8 pg — ABNORMAL HIGH (ref 26.0–34.0)
MCHC: 36.9 g/dL — ABNORMAL HIGH (ref 30.0–36.0)
MCV: 96.8 fL (ref 78.0–100.0)
Platelets: 271 10*3/uL (ref 150–400)
RBC: 5.65 MIL/uL (ref 4.22–5.81)
RDW: 13.2 % (ref 11.5–15.5)
WBC: 14.8 10*3/uL — ABNORMAL HIGH (ref 4.0–10.5)

## 2010-11-15 LAB — CK TOTAL AND CKMB (NOT AT ARMC)
CK, MB: 2 ng/mL (ref 0.3–4.0)
Relative Index: 1.9 (ref 0.0–2.5)

## 2010-11-16 LAB — DIFFERENTIAL
Basophils Absolute: 0 10*3/uL (ref 0.0–0.1)
Basophils Relative: 0 % (ref 0–1)
Monocytes Relative: 8 % (ref 3–12)
Neutro Abs: 11.5 10*3/uL — ABNORMAL HIGH (ref 1.7–7.7)
Neutrophils Relative %: 79 % — ABNORMAL HIGH (ref 43–77)

## 2010-11-16 LAB — GLUCOSE, CAPILLARY
Glucose-Capillary: 107 mg/dL — ABNORMAL HIGH (ref 70–99)
Glucose-Capillary: 90 mg/dL (ref 70–99)

## 2010-11-16 LAB — LACTIC ACID, PLASMA
Lactic Acid, Venous: 2.6 mmol/L — ABNORMAL HIGH (ref 0.5–2.2)
Lactic Acid, Venous: 1.3 mmol/L (ref 0.5–2.2)

## 2010-11-16 LAB — CK TOTAL AND CKMB (NOT AT ARMC)
CK, MB: 2.8 ng/mL (ref 0.3–4.0)
Relative Index: 1 (ref 0.0–2.5)
Relative Index: 1.2 (ref 0.0–2.5)
Total CK: 267 U/L — ABNORMAL HIGH (ref 7–232)

## 2010-11-16 LAB — COMPREHENSIVE METABOLIC PANEL
ALT: 13 U/L (ref 0–53)
AST: 18 U/L (ref 0–37)
Calcium: 7.9 mg/dL — ABNORMAL LOW (ref 8.4–10.5)
GFR calc Af Amer: >60 mL/min (ref >60)
Sodium: 140 mEq/L (ref 135–145)
Total Protein: 6.6 g/dL (ref 6.0–8.3)

## 2010-11-16 LAB — CBC
Hemoglobin: 16 g/dL (ref 13.0–17.0)
RBC: 4.67 MIL/uL (ref 4.22–5.81)
WBC: 14.6 10*3/uL — ABNORMAL HIGH (ref 4.0–10.5)

## 2010-11-16 MED ORDER — IOHEXOL 300 MG/ML  SOLN
100.0000 mL | Freq: Once | INTRAMUSCULAR | Status: AC | PRN
  Administered 2010-11-16: 100 mL via INTRAVENOUS

## 2010-11-17 LAB — BASIC METABOLIC PANEL
CO2: 24 mEq/L (ref 19–32)
Chloride: 106 mEq/L (ref 96–112)
Creatinine, Ser: 0.59 mg/dL (ref 0.4–1.5)
Potassium: 4 mEq/L (ref 3.5–5.1)

## 2010-11-17 LAB — CBC
MCH: 34.5 pg — ABNORMAL HIGH (ref 26.0–34.0)
MCHC: 34.8 g/dL (ref 30.0–36.0)
MCV: 99.3 fL (ref 78.0–100.0)
Platelets: 210 10*3/uL (ref 150–400)
RBC: 4 MIL/uL — ABNORMAL LOW (ref 4.22–5.81)
RDW: 13.2 % (ref 11.5–15.5)

## 2010-11-17 LAB — URINALYSIS, ROUTINE W REFLEX MICROSCOPIC
Glucose, UA: NEGATIVE mg/dL
Hgb urine dipstick: NEGATIVE
Specific Gravity, Urine: 1.022 (ref 1.005–1.030)
pH: 6 (ref 5.0–8.0)

## 2010-11-17 LAB — GLUCOSE, CAPILLARY: Glucose-Capillary: 78 mg/dL (ref 70–99)

## 2010-11-17 LAB — DIFFERENTIAL
Basophils Relative: 0 % (ref 0–1)
Eosinophils Absolute: 0.2 10*3/uL (ref 0.0–0.7)
Eosinophils Relative: 3 % (ref 0–5)
Monocytes Relative: 11 % (ref 3–12)
Neutrophils Relative %: 61 % (ref 43–77)

## 2010-11-17 LAB — URINE MICROSCOPIC-ADD ON

## 2010-11-18 LAB — GLUCOSE, CAPILLARY

## 2010-11-22 LAB — CULTURE, BLOOD (ROUTINE X 2)
Culture  Setup Time: 201206120851
Culture  Setup Time: 201206120851
Culture: NO GROWTH

## 2010-11-29 NOTE — H&P (Signed)
NAME:  Pace, [name] NO.:  [id number]  MEDICAL RECORD NO.:  [id number]  LOCATION:  6715                         FACILITY:  [facility]  PHYSICIAN:  [name] Pace, MDDATE OF BIRTH:  1951/01/22  DATE OF ADMISSION:  11/15/2010 DATE OF DISCHARGE:                             HISTORY & PHYSICAL   PRIMARY CARE PHYSICIAN:  [name] Laughter. Renae Gloss, MD  PRIMARY CARDIOLOGIST:  [name] Batty, MD  CHIEF COMPLAINT:  Chest pain.  HISTORY OF PRESENTING ILLNESS:  A 60 year old male with known history of CAD status post bypass, status post stenting, previous history of pancreatitis, history of CA of the breast status post surgery, history of CA prostate status post surgery, presented with complaints of having sudden onset of chest pain last night when he was eating his supper at 9:30 p.m.  He suddenly developed severe chest pain.  He felt nauseous and diaphoretic.  Eventually, the pain went to his lower part of the abdomen around the periumbilical area, it became constant, stabbing in nature.  In the ER, the patient had EKG which showed some nonspecific ST- T changes.  Cardiac enzyme was negative.  The patient underwent CAT scan of the abdomen and pelvis which at this time is showing ascending colitis and also some esophagitis.  There was no evidence of any pancreatitis.  The patient has been admitted for colitis and chest pain.  The patient states, at this time, his abdominal pain and chest pain has largely resolved.  Denies any shortness of breath.  Denies any dizziness, loss of consciousness, or palpitation.  Denies any focal deficit, any dysuria, discharges, did not have any diarrhea.  The patient has not recently used any antibiotics.  PAST MEDICAL HISTORY:  History of CAD status post CABG, status post stenting, history of hyperlipidemia, history of CA prostate status post prostate surgery, history of left breast cancer status post surgery, history of  cholecystectomy.  MEDICATIONS PRIOR TO ADMISSION: 1. Plavix 75 p.o. daily. 2. Aspirin 81 mg p.o. daily. 3. He was also on cholesterol medication.  ALLERGIES:  PENICILLIN.  FAMILY HISTORY:  Negative for any colon cancer or breast cancer.  SOCIAL HISTORY:  The patient smokes cigarettes, drinks alcohol once or twice a week.  Denies any drug abuse.  REVIEW OF SYSTEMS:  As per the history of presenting illness, nothing else significant.  PHYSICAL EXAMINATION:  GENERAL:  The patient was examined at the bedside, not in acute distress. VITAL SIGNS:  Blood pressure is 110/60, pulse is 80 per minute, temperature 99, respirations 18 per minute, O2 sat 97%. HEENT:  Anicteric.  No pallor.  No discharge from ears, eyes, nose, or mouth. CHEST:  Bilateral air entry present.  No rhonchi.  No crepitation. HEART:  S1 and S2 heard. ABDOMEN:  Soft, nontender.  Bowel sounds heard. CNS:  The patient is alert, awake, and oriented time, place, and person. Moves  upper and lower extremities, 5/5. EXTREMITIES:  Peripheral pulses felt.  No edema.  LABS:  EKG shows normal sinus rhythm with some ST-T changes, some of them are new compared to the old EKG.  There is particularly ST depression in V3, V4, and V5, and some changes  in the inferior leads. Heart rate is around 99 beats per minute.  Chest x-ray shows COPD and bronchitic changes.  No focal abnormality.  CT abdomen and pelvis with contrast shows mild ascending cholangitis, most common infectious, other etiologies include ischemic, inflammatory bowel disease, cholecystectomy, atherosclerosis, persistent thickening of the distal gastric esophageal junction, small hiatal hernia.  Consider followup upper endoscopy, question esophagitis.  No evidence of acute pancreatitis.  CBC; WBC 14.8, hemoglobin is 20.2, hematocrit is 54.7, platelets 271, neutrophils 89%.  Complete metabolic panel; sodium 139, potassium 4.3, chloride 100, carbon dioxide is 17,  anion gap is 22, glucose 144, BUN 18, creatinine 1, alkaline phosphatase 101, AST 22, ALT 19, total protein 8.4, albumin 4.4, calcium 9.8, lactic acid is 2.6, lipase 21. CK is 106, CK-MB is 2, relative index 1.9, troponin less than 0.3.  ASSESSMENT: 1. Colitis. 2. Chest pain. 3. Metabolic acidosis and lactic acidosis. 4. History of coronary artery disease status post coronary artery     bypass graft, status post stenting. 5. History of CA breast status post surgery. 6. History of prostate cancer status post surgery. 7. Ongoing tobacco abuse.  PLAN: 1. At this time, we are going to admit the patient to telemetry. 2. For his colitis, at this time, it could be either infectious,     inflammatory, or even ischemic given his lactic acid is being high.     I am going to hydrate the patient with normal saline, keep the     patient n.p.o.  We will place the patient on Cipro and Flagyl.  At     this time, given his leukocytes, I am going to get blood cultures,     also get urine cultures.  We will recheck CMET, CBC, and lactic     acid in a.m.  We will consult GI. 3. Chest pain which has improved at this time.  We are going to     continue his aspirin and Plavix.  There is some septal EKG changes.     I have consulted Cardiology.  We will cycle cardiac markers.  His     chest pain also could be from esophagitis, for which the patient     will need GI followup after his acute episode is over but at this     time,     we are going to rule out ACS.  We will also follow Cardiology     recommendations.  I am also placing the patient on Protonix. 4. Further recommendation as condition evolves.     [name] Clos, MD     ANK/MEDQ  D:  11/16/2010  T:  11/16/2010  Job:  [id number]  cc:   [name] Laughter. Renae Pace, M.D. [name] Pace, M.D.  Electronically Signed by [name] Minium MD on 11/29/2010 07:31:57 AM

## 2010-12-06 NOTE — Discharge Summary (Signed)
NAME:  [name] Pace, TESFAYE NO.:  [id number]  MEDICAL RECORD NO.:  [id number]  LOCATION:  6715                         FACILITY:  [facility]  PHYSICIAN:  Marcellus Scott, MD     DATE OF BIRTH:  December 09, 1950  DATE OF ADMISSION:  11/15/2010 DATE OF DISCHARGE:  11/17/2010                              DISCHARGE SUMMARY   PRIMARY CARE PHYSICIAN:  Cala Bradford R. Renae Gloss, MD  CARDIOLOGIST:  Nanetta Batty, MD  DISCHARGE DIAGNOSES: 1. Noncardiac chest pain.  Resolved.  Workup negative. 2. Tobacco abuse. 3. History of coronary artery disease status post coronary artery     bypass graft.  Consider outpatient initiation of statin therapy. 4. Chronic benzodiazepine use. 5. A 1.5-cm sessile polyp on the descending colon. 6. Hiatal hernia on EGD. 7. Mild dehydration.  DISCHARGE MEDICATIONS: 1. Metoprolol 12.5 mg p.o. b.i.d. 2. Omeprazole 20 mg p.o. daily. 3. Alprazolam 1 mg p.o. t.i.d. 4. Aspirin 81 mg p.o. daily. 5. Plavix 75 mg p.o. daily. 6. Vicodin 5/500, 1 p.o. q.6 hourly p.r.n. for pain.  PROCEDURES: 1. EGD.  Impression:  Hiatal hernia.  Slight stenosis at esophageal     gastric junction. 2. Colonoscopy:  Tortuous colon.  Very poor preparation.  No evidence     of colitis in ascending colon.  A 1.5 cm sessile polyp in the     descending colon which was not removed because of very poor     preparation and the patient on Plavix and aspirin.  The patient     will need a repeat colonoscopy as outpatient while he is off     Plavix. 3. CT of the abdomen and pelvis with contrast on June 12. Impression: A.  Mild descending colitis, most commonly infectious.  Other etiologies include ischemic inflammatory bowel disease. B.  Cholecystectomy. C.  Atherosclerosis. D.  Persistent thickening of the distal gastroesophageal junction. Small hiatal hernia. E.  No evidence of acute pancreatitis. 1. Chest x-ray November 15, 2010. Impression: A.  COPD and bronchitic changes. B.  No focal  pulmonary abnormalities. 1. A 2-D echocardiogram, left ventricular ejection fraction 50-55%     with mild left ventricular hypertrophy.  Wall motion normal and no     regional wall motion abnormalities.  Pulmonary artery peak pressure     28 mmHg.  LABORATORY DATA:  Urinalysis did not show any features suggestive of urinary tract infection.  Blood cultures x2 were no growth to date. Basic metabolic panel was within normal limits.  BUN 12, creatinine 0.59.  CBC within normal limits.  Hemoglobin 13.8, hematocrit 39.7, white blood cell 9.7, platelets 210,000.  Cardiac enzyme showed mildly elevated CK to a maximum of 496, CK-MB was 5.8.  TSH normal at 1.670, magnesium 2.3, albumin 3.4, venous lactate 1.3.  Initial white blood cell of 16,000 and a lactate of 2.6, lipase 21.  Hepatic panel otherwise unremarkable.  Troponin x1 negative.  CONSULTATIONS: 1. Cardiology, Dr. Nanetta Batty. 2. Gastroenterology, Dr. Herbert Moors.  DIET:  Heart healthy diet.  ACTIVITIES:  Ad lib.  Complaints of mild intermittent epigastric pain but significantly better than on admission.  The patient has tolerated regular diet post EGD.  He  denies any chest pain or nausea or vomiting.  PHYSICAL EXAMINATION:  GENERAL:  The patient is no obvious distress. VITAL SIGNS:  Temperature 97.9 degrees Fahrenheit, pulse 49 per minute, respiration 20 per minute, blood pressure 116/65 mmHg, and saturating between 90 and 100% on room air. RESPIRATORY SYSTEM:  Clear. CARDIOVASCULAR SYSTEM:  S1, S2 heard, regular. ABDOMEN:  Nondistended, minimal epigastric tenderness but otherwise soft and bowel sounds present. CENTRAL NERVOUS SYSTEM:  The patient is awake, alert, oriented x3 with no focal neurological deficits.  HOSPITAL COURSE:  [name] is a 60 year old male patient with history of coronary artery disease status post bypass who has not followed up with his cardiologist, prior history of pancreatitis, history  of carcinoma of the breast status post surgery, history of carcinoma of the prostate status post surgery who has ongoing tobacco abuse presented with sudden onset of chest pain while he was eating supper.  He had associated nausea and diaphoresis.  Eventually the pain localized to the lower part of the abdomen around the periumbilical area.  His EKG showed nonspecific ST-T changes and the patient had a CT scan which showed evidence of ascending colitis and some distal esophageal thickening. The [facility] were requested to admit for further evaluation and management. 1. Noncardiac chest pain.  The patient was admitted to the hospital.     His telemetry did not show any arrhythmia alarms.  Cardiology were     consulted and Dr. Allyson Sabal kindly provided the consult.  He has seen     the patient today and indicated that the patient can see him as an     outpatient.  The cause of the pain was probably gastric in origin.     Given the CT findings, Gastroenterology consultation was requested     and kindly provided by Dr. Evette Cristal who performed the procedures as     indicated above.  He has discontinued the antibiotics and     recommended continuing the patient on proton pump inhibitors and     outpatient followup for an interim colonoscopy when he is off     Plavix. 2. Tobacco abuse.  Cessation counseling done. 3. COPD on chest x-ray.  Again tobacco cessation counseling done. 4. Chronic benzodiazepines.  Possibly on for anxiety disorder.  DISPOSITION:  The patient is discharged home in stable condition.  FOLLOWUP RECOMMENDATIONS: 1. With Dr. Andi Devon.  The patient has made an appointment for     June 21 at 2:30 p.m. 2. With Dr. Evette Cristal.  The patient is to call for an appointment to be     seen in a month's time. 3. With Dr. Nanetta Batty.  The patient is to call for an     appointment.  Time taken in coordinating this discharge is 30 minutes.     Marcellus Scott,  MD     AH/MEDQ  D:  11/17/2010  T:  11/18/2010  Job:  [id number]  cc:   Merlene Laughter. Renae Gloss, M.D. Nanetta Batty, M.D. Graylin Shiver, M.D.  Electronically Signed by Marcellus Scott MD on 12/06/2010 11:02:43 PM

## 2010-12-29 NOTE — Op Note (Signed)
  NAME:  [name] Pace, [name] Pace NO.:  [id number]  MEDICAL RECORD NO.:  [id number]  LOCATION:  6715                         FACILITY:  [facility]  PHYSICIAN:  Graylin Shiver, M.D.   DATE OF BIRTH:  1950/06/21  DATE OF PROCEDURE:  11/17/2010 DATE OF DISCHARGE:  11/17/2010                              OPERATIVE REPORT   INDICATION:  Noncardiac chest pain, rule out upper GI lesion as the source for this pain.  Informed consent was obtained after explanation of the risks of bleeding, infection and perforation.  PREMEDICATIONS: 1. Fentanyl 100 mcg IV. 2. Versed 6 mg IV. 3. Benadryl 25 mg IV.  PROCEDURE:  With the patient in the left lateral decubitus position, the Pentax gastroscope was inserted into the oropharynx and passed into the esophagus.  It was advanced down the esophagus, then into the stomach and into the duodenum.  The second portion and bulb of the duodenum were normal.  The stomach had normal-appearing mucosa.  There was a hiatal hernia present.  The esophagus had some slight stenosis at the EG junction, but otherwise normal appearing.  The rest of the esophagus looked normal.  He tolerated the procedure well without complications.  IMPRESSION:  Hiatal hernia and slight stenosis at the esophagogastric junction, otherwise normal upper endoscopy.          ______________________________ Graylin Shiver, M.D.     SFG/MEDQ  D:  11/22/2010  T:  11/23/2010  Job:  [id number]  Electronically Signed by Herbert Moors MD on 12/29/2010 03:42:00 PM

## 2010-12-29 NOTE — Op Note (Signed)
  NAME:  [name] Pace, [name] Pace NO.:  [id number]  MEDICAL RECORD NO.:  [id number]  LOCATION:  6715                         FACILITY:  [facility]  PHYSICIAN:  Graylin Shiver, M.D.   DATE OF BIRTH:  02/03/1951  DATE OF PROCEDURE:  11/17/2010 DATE OF DISCHARGE:  11/17/2010                              OPERATIVE REPORT   INDICATIONS FOR PROCEDURE:  CT scan of the abdomen and pelvis raised the question of mild ascending colitis.  Informed consent was obtained after explanation of the risks of bleeding, infection and perforation.  PREMEDICATION:  The procedure was done after an EGD, see EGD report for total dose given.  PROCEDURE:  With the patient in the left lateral decubitus position, a rectal exam was performed and no masses were felt.  The Pentax colonoscope was inserted into the rectum and advanced around a very tortuous colon to the cecum.  Cecal landmarks were identified.  The overall preparation was very poor of the colon.  The cecum and ascending colon mucosa was able to be visualized and there was no evidence of colitis in the ascending colon.  There was a 1.5-cm sessile polyp noted in the descending colon which was not removed because of the very poor prep and also the patient was on Plavix and aspirin and I did not want to risk bleeding.  The rest of the exam did not reveal any lesions, however, the preparation was very poor.  He tolerated the procedure well without complications.  IMPRESSION: 1. No evidence of colitis. 2. A 1.5 cm sessile polyp in the descending colon which was not     removed because of a very poor prep and the patient was on Plavix     and aspirin.  PLAN:  This patient will need to come back as an outpatient and have a followup colonoscopy with a more aggressive prep to clean him out so that we can see the mucosa better and look for other lesions as well as have him on Plavix for the time-being so as not to risk bleeding from the lesions  seen in the descending colon which needs to be removed.          ______________________________ Graylin Shiver, M.D.     SFG/MEDQ  D:  11/22/2010  T:  11/23/2010  Job:  [id number]  Electronically Signed by Herbert Moors MD on 12/29/2010 03:41:57 PM

## 2010-12-29 NOTE — Consult Note (Signed)
  NAME:  [name] Pace, [name] Pace NO.:  [id number]  MEDICAL RECORD NO.:  [id number]  LOCATION:  6715                         FACILITY:  [facility]  PHYSICIAN:  Graylin Shiver, M.D.   DATE OF BIRTH:  August 19, 1950  DATE OF CONSULTATION:  11/16/2010 DATE OF DISCHARGE:                                CONSULTATION   REASON FOR CONSULTATION:  The patient is a 60 year old male who was admitted to the hospital because of chest pain which started on Sunday, 2 days ago.  He became nauseated with this chest pain and also diaphoretic.  The chest pain initially occurred after he was eating dinner.  The pain then traveled to his lower abdomen and mid abdomen around the periumbilical area.  He described it as 8-10 over 10 pain which was stabbing in nature.  He went to the emergency room where he was evaluated and cardiac enzymes were checked and were negative.  The patient has been seen by Cardiology and ruled out for myocardial infarction.  A CT scan of the abdomen and pelvis was done which showed evidence of mild colitis in the ascending colon and there was thickening of the distal gastroesophageal junction.  Followup endoscopy was suggested.  There was no evidence of pancreatitis on CT scan and a lipase was normal.  Of interest in regards to the possibility of colitis the patient denies any diarrhea or rectal bleeding.  He recalls having had a colonoscopy 6-7 years ago and thinks he had a few polyps.  At the present time, he is not having any further pain.  GI was consulted to further evaluate his pain as well as the potential colitis seen on CT scan.  PAST HISTORY: 1. Coronary artery disease status post coronary artery bypass graft     and also stenting. 2. History of prostate cancer for which he had surgery. 3. History of breast cancer. 4. History of cholecystectomy.  MEDICATIONS PRIOR TO ADMISSION:  Plavix, aspirin, also a cholesterol medication.  ALLERGIES:  PENICILLIN.  SOCIAL  HISTORY:  He smokes and drinks alcohol occasionally.  REVIEW OF SYSTEMS:  Negative except for above.  PHYSICAL EXAMINATION:  GENERAL:  He is alert and oriented and does not appear in any acute distress.  He is nonicteric. HEART:  Regular rhythm.  No murmurs. LUNGS:  Clear. ABDOMEN:  Bowel sounds are normal, soft, nontender in the lower portion but he does have a little tenderness in the epigastric region.  IMPRESSION: 1. Chest pain possibly noncardiac in nature.  Rule out peptic ulcer     disease, rule out esophagitis. 2. Question of colitis raised on CT scan.  PLAN:  I will proceed with EGD and colonoscopy tomorrow to evaluate both the upper and lower GI tracts to see if we can find a reason for his pain and also to determine whether or not there is evidence of colitis.          ______________________________ Graylin Shiver, M.D.     SFG/MEDQ  D:  11/16/2010  T:  11/17/2010  Job:  [id number]  Electronically Signed by Herbert Moors MD on 12/29/2010 03:41:53 PM

## 2011-12-28 ENCOUNTER — Emergency Department (HOSPITAL_COMMUNITY)
Admission: EM | Admit: 2011-12-28 | Discharge: 2011-12-28 | Discharge status: Against Medical Advice | Payer: Medicare Other | Attending: Emergency Medicine | Admitting: Emergency Medicine

## 2011-12-28 ENCOUNTER — Emergency Department (HOSPITAL_COMMUNITY): Payer: Medicare Other

## 2011-12-28 ENCOUNTER — Encounter (HOSPITAL_COMMUNITY): Payer: Self-pay | Admitting: Emergency Medicine

## 2011-12-28 DIAGNOSIS — R0789 Other chest pain: Secondary | ICD-10-CM | POA: Insufficient documentation

## 2011-12-28 HISTORY — DX: Pure hypercholesterolemia, unspecified: E78.00

## 2011-12-28 NOTE — ED Notes (Signed)
Attempted multiple times to call pt's cellphone with no answer.  Called pt's home number and was told he was not there and to call his cell phone number.  Unable to locate pt.

## 2011-12-28 NOTE — ED Notes (Signed)
Called for treatment room x 3 by 3 different people with no answer.

## 2011-12-28 NOTE — ED Notes (Signed)
Pt fell approx 10 feet off of a ladder 2-3 days ago.  C/o pain to L ribs and chest since falling.  Abrasion noted to L side. Denies LOC.  Denies neck and back pain.

## 2011-12-28 NOTE — ED Notes (Signed)
Called pt for reassessment. No answer.  

## 2011-12-29 ENCOUNTER — Emergency Department (HOSPITAL_COMMUNITY)
Admission: EM | Admit: 2011-12-29 | Discharge: 2011-12-29 | Disposition: A | Discharge status: Home / Self Care | Payer: Medicare Other | Attending: Emergency Medicine | Admitting: Emergency Medicine

## 2011-12-29 ENCOUNTER — Encounter (HOSPITAL_COMMUNITY): Payer: Self-pay | Admitting: Emergency Medicine

## 2011-12-29 DIAGNOSIS — R079 Chest pain, unspecified: Secondary | ICD-10-CM | POA: Insufficient documentation

## 2011-12-29 DIAGNOSIS — E7800 Pure hypercholesterolemia, unspecified: Secondary | ICD-10-CM | POA: Insufficient documentation

## 2011-12-29 DIAGNOSIS — I2510 Atherosclerotic heart disease of native coronary artery without angina pectoris: Secondary | ICD-10-CM | POA: Insufficient documentation

## 2011-12-29 DIAGNOSIS — Z951 Presence of aortocoronary bypass graft: Secondary | ICD-10-CM | POA: Insufficient documentation

## 2011-12-29 DIAGNOSIS — F17200 Nicotine dependence, unspecified, uncomplicated: Secondary | ICD-10-CM | POA: Insufficient documentation

## 2011-12-29 HISTORY — DX: Atherosclerotic heart disease of native coronary artery without angina pectoris: I25.10

## 2011-12-29 NOTE — ED Notes (Signed)
Larey Seat a couple of days ago and he landed on top of a washer /dryer not having rib pain

## 2011-12-29 NOTE — ED Notes (Signed)
Pt called to be roomed x3. No response in Waiting Room.

## 2011-12-29 NOTE — ED Notes (Signed)
Came last night but waited to long and went home pt states had xays yesterday hurts to take a deep breath

## 2011-12-29 NOTE — ED Provider Notes (Signed)
History     CSN: [id number]  Arrival date & time 12/29/11  1410   First MD Initiated Contact with Patient 12/29/11 1701      Chief Complaint  Patient presents with  . Chest Pain    (Consider location/radiation/quality/duration/timing/severity/associated sxs/prior treatment) HPI  Past Medical History  Diagnosis Date  . High cholesterol   . Coronary artery disease     Past Surgical History  Procedure Date  . Coronary artery bypass graft   . Carotid stent     No family history on file.  History  Substance Use Topics  . Smoking status: Current Everyday Smoker  . Smokeless tobacco: Not on file  . Alcohol Use: Yes      Review of Systems  Allergies  Review of patient's allergies indicates no known allergies.  Home Medications   Current Outpatient Rx  Name Route Sig Dispense Refill  . ASPIRIN EC 81 MG PO TBEC Oral Take 81 mg by mouth daily.    Marland Kitchen CLOPIDOGREL BISULFATE 75 MG PO TABS Oral Take 75 mg by mouth daily.    Marland Kitchen SIMVASTATIN 40 MG PO TABS Oral Take 40 mg by mouth every evening.      BP 97/72  Pulse 66  Temp 98.3 F (36.8 C)  Resp 20  Physical Exam  ED Course  Procedures (including critical care time)  Labs Reviewed - No data to display Dg Ribs Unilateral W/chest Left  12/28/2011  *RADIOLOGY REPORT*  Clinical Data: Fall from ladder with left rib injury and pain.  LEFT RIBS AND CHEST - 3+ VIEW  Comparison: Chest x-ray dated 11/15/2010  Findings: No evidence of pneumothorax, pulmonary consolidation or pleural effusion.  Heart size is stable status post prior CABG. Rib films demonstrate irregularity of the lateral aspect of the seventh rib which likely represents a minimally displaced fracture. There may also be a subtle nondisplaced fracture in the adjacent eighth rib.  IMPRESSION: Minimally displaced fracture of the left seventh rib and also possible nondisplaced fracture of the eighth rib.  No associated pneumothorax or hemothorax.  Original Report  Authenticated By: Reola Calkins, M.D.     No diagnosis found.    MDM  Not seen by me        Doug Sou, MD 12/29/11 2011

## 2012-06-26 ENCOUNTER — Ambulatory Visit
Admission: RE | Admit: 2012-06-26 | Discharge: 2012-06-26 | Disposition: A | Discharge status: Home / Self Care | Payer: Medicare Other | Source: Ambulatory Visit | Attending: Internal Medicine | Admitting: Internal Medicine

## 2012-06-26 ENCOUNTER — Other Ambulatory Visit: Payer: Self-pay | Admitting: Internal Medicine

## 2012-06-26 DIAGNOSIS — M25512 Pain in left shoulder: Secondary | ICD-10-CM

## 2012-06-26 DIAGNOSIS — M25511 Pain in right shoulder: Secondary | ICD-10-CM

## 2013-12-10 ENCOUNTER — Emergency Department (HOSPITAL_COMMUNITY): Payer: Medicare Other

## 2013-12-10 ENCOUNTER — Emergency Department (HOSPITAL_COMMUNITY)
Admission: EM | Admit: 2013-12-10 | Discharge: 2013-12-10 | Disposition: A | Discharge status: Home / Self Care | Payer: Medicare Other | Attending: Emergency Medicine | Admitting: Emergency Medicine

## 2013-12-10 ENCOUNTER — Encounter (HOSPITAL_COMMUNITY): Payer: Self-pay | Admitting: Emergency Medicine

## 2013-12-10 DIAGNOSIS — I2510 Atherosclerotic heart disease of native coronary artery without angina pectoris: Secondary | ICD-10-CM | POA: Insufficient documentation

## 2013-12-10 DIAGNOSIS — R112 Nausea with vomiting, unspecified: Secondary | ICD-10-CM | POA: Insufficient documentation

## 2013-12-10 DIAGNOSIS — Z880 Allergy status to penicillin: Secondary | ICD-10-CM | POA: Insufficient documentation

## 2013-12-10 DIAGNOSIS — Z8639 Personal history of other endocrine, nutritional and metabolic disease: Secondary | ICD-10-CM | POA: Insufficient documentation

## 2013-12-10 DIAGNOSIS — Z862 Personal history of diseases of the blood and blood-forming organs and certain disorders involving the immune mechanism: Secondary | ICD-10-CM | POA: Insufficient documentation

## 2013-12-10 DIAGNOSIS — Z79899 Other long term (current) drug therapy: Secondary | ICD-10-CM | POA: Insufficient documentation

## 2013-12-10 DIAGNOSIS — Z7902 Long term (current) use of antithrombotics/antiplatelets: Secondary | ICD-10-CM | POA: Insufficient documentation

## 2013-12-10 DIAGNOSIS — F17200 Nicotine dependence, unspecified, uncomplicated: Secondary | ICD-10-CM | POA: Insufficient documentation

## 2013-12-10 DIAGNOSIS — R1013 Epigastric pain: Secondary | ICD-10-CM

## 2013-12-10 DIAGNOSIS — Z951 Presence of aortocoronary bypass graft: Secondary | ICD-10-CM | POA: Insufficient documentation

## 2013-12-10 DIAGNOSIS — R0789 Other chest pain: Secondary | ICD-10-CM | POA: Insufficient documentation

## 2013-12-10 LAB — I-STAT TROPONIN, ED
TROPONIN I, POC: 0 ng/mL (ref 0.00–0.08)
TROPONIN I, POC: 0.01 ng/mL (ref 0.00–0.08)

## 2013-12-10 LAB — HEPATIC FUNCTION PANEL
ALT: 21 U/L (ref 0–53)
AST: 21 U/L (ref 0–37)
Albumin: 3.7 g/dL (ref 3.5–5.2)
Alkaline Phosphatase: 88 U/L (ref 39–117)
Total Bilirubin: 0.5 mg/dL (ref 0.3–1.2)
Total Protein: 7.1 g/dL (ref 6.0–8.3)

## 2013-12-10 LAB — BASIC METABOLIC PANEL
Anion gap: 20 — ABNORMAL HIGH (ref 5–15)
BUN: 9 mg/dL (ref 6–23)
CALCIUM: 9.1 mg/dL (ref 8.4–10.5)
CO2: 20 mEq/L (ref 19–32)
Chloride: 103 mEq/L (ref 96–112)
Creatinine, Ser: 0.83 mg/dL (ref 0.50–1.35)
GFR calc Af Amer: >90 mL/min (ref >90)
GLUCOSE: 108 mg/dL — AB (ref 70–99)
Potassium: 3.4 mEq/L — ABNORMAL LOW (ref 3.7–5.3)
SODIUM: 143 meq/L (ref 137–147)

## 2013-12-10 LAB — CBC
HCT: 46.1 % (ref 39.0–52.0)
HEMOGLOBIN: 16.6 g/dL (ref 13.0–17.0)
MCH: 35.5 pg — AB (ref 26.0–34.0)
MCHC: 36 g/dL (ref 30.0–36.0)
MCV: 98.7 fL (ref 78.0–100.0)
Platelets: 263 10*3/uL (ref 150–400)
RBC: 4.67 MIL/uL (ref 4.22–5.81)
RDW: 12.3 % (ref 11.5–15.5)
WBC: 10.3 10*3/uL (ref 4.0–10.5)

## 2013-12-10 LAB — LIPASE, BLOOD: Lipase: 23 U/L (ref 11–59)

## 2013-12-10 MED ORDER — GI COCKTAIL ~~LOC~~
30.0000 mL | Freq: Once | ORAL | Status: AC
  Administered 2013-12-10: 30 mL via ORAL
  Filled 2013-12-10: qty 30

## 2013-12-10 MED ORDER — PANTOPRAZOLE SODIUM 40 MG IV SOLR
40.0000 mg | Freq: Once | INTRAVENOUS | Status: AC
  Administered 2013-12-10: 40 mg via INTRAVENOUS
  Filled 2013-12-10: qty 40

## 2013-12-10 MED ORDER — ONDANSETRON HCL 4 MG/2ML IJ SOLN
4.0000 mg | Freq: Once | INTRAMUSCULAR | Status: AC
  Administered 2013-12-10: 4 mg via INTRAVENOUS
  Filled 2013-12-10: qty 2

## 2013-12-10 MED ORDER — ONDANSETRON 4 MG PO TBDP: ORAL_TABLET | ORAL | Status: DC

## 2013-12-10 MED ORDER — SODIUM CHLORIDE 0.9 % IV SOLN
1000.0000 mL | Freq: Once | INTRAVENOUS | Status: AC
  Administered 2013-12-10: 1000 mL via INTRAVENOUS

## 2013-12-10 MED ORDER — OMEPRAZOLE 40 MG PO CPDR: 40.0000 mg | DELAYED_RELEASE_CAPSULE | Freq: Every day | ORAL | Status: DC

## 2013-12-10 NOTE — ED Notes (Signed)
Dr Lita Mains gave okay to place IV in lower extremity.

## 2013-12-10 NOTE — ED Notes (Signed)
Patient transported to X-ray 

## 2013-12-10 NOTE — Discharge Instructions (Signed)
Chest Pain (Nonspecific) It is often hard to give a diagnosis for the cause of chest pain. There is always a chance that your pain could be related to something serious, such as a heart attack or a blood clot in the lungs. You need to follow up with your doctor. HOME CARE  If antibiotic medicine was given, take it as directed by your doctor. Finish the medicine even if you start to feel better.  For the next few days, avoid activities that bring on chest pain. Continue physical activities as told by your doctor.  Do not use any tobacco products. This includes cigarettes, chewing tobacco, and e-cigarettes.  Avoid drinking alcohol.  Only take medicine as told by your doctor.  Follow your doctor's suggestions for more testing if your chest pain does not go away.  Keep all doctor visits you made. GET HELP IF:  Your chest pain does not go away, even after treatment.  You have a rash with blisters on your chest.  You have a fever. GET HELP RIGHT AWAY IF:   You have more pain or pain that spreads to your arm, neck, jaw, back, or belly (abdomen).  You have shortness of breath.  You cough more than usual or cough up blood.  You have very bad back or belly pain.  You feel sick to your stomach (nauseous) or throw up (vomit).  You have very bad weakness.  You pass out (faint).  You have chills. This is an emergency. Do not wait to see if the problems will go away. Call your local emergency services (911 in [place] Do not drive yourself to the hospital. MAKE SURE YOU:   Understand these instructions.  Will watch your condition.  Will get help right away if you are not doing well or get worse. Document Released: 11/09/2007 Document Revised: 05/28/2013 Document Reviewed: 11/09/2007 [facility] Patient Information 2015 [place], [place]. This information is not intended to replace advice given to you by your health care provider. Make sure you discuss any questions you have with your  health care provider. Gastritis, Adult Gastritis is soreness and swelling (inflammation) of the lining of the stomach. Gastritis can develop as a sudden onset (acute) or long-term (chronic) condition. If gastritis is not treated, it can lead to stomach bleeding and ulcers. CAUSES  Gastritis occurs when the stomach lining is weak or damaged. Digestive juices from the stomach then inflame the weakened stomach lining. The stomach lining may be weak or damaged due to viral or bacterial infections. One common bacterial infection is the Helicobacter pylori infection. Gastritis can also result from excessive alcohol consumption, taking certain medicines, or having too much acid in the stomach.  SYMPTOMS  In some cases, there are no symptoms. When symptoms are present, they may include:  Pain or a burning sensation in the upper abdomen.  Nausea.  Vomiting.  An uncomfortable feeling of fullness after eating. DIAGNOSIS  Your caregiver may suspect you have gastritis based on your symptoms and a physical exam. To determine the cause of your gastritis, your caregiver may perform the following:  Blood or stool tests to check for the H pylori bacterium.  Gastroscopy. A thin, flexible tube (endoscope) is passed down the esophagus and into the stomach. The endoscope has a light and camera on the end. Your caregiver uses the endoscope to view the inside of the stomach.  Taking a tissue sample (biopsy) from the stomach to examine under a microscope. TREATMENT  Depending on the cause of your  gastritis, medicines may be prescribed. If you have a bacterial infection, such as an H pylori infection, antibiotics may be given. If your gastritis is caused by too much acid in the stomach, H2 blockers or antacids may be given. Your caregiver may recommend that you stop taking aspirin, ibuprofen, or other nonsteroidal anti-inflammatory drugs (NSAIDs). HOME CARE INSTRUCTIONS  Only take over-the-counter or prescription  medicines as directed by your caregiver.  If you were given antibiotic medicines, take them as directed. Finish them even if you start to feel better.  Drink enough fluids to keep your urine clear or pale yellow.  Avoid foods and drinks that make your symptoms worse, such as:  Caffeine or alcoholic drinks.  Chocolate.  Peppermint or mint flavorings.  Garlic and onions.  Spicy foods.  Citrus fruits, such as oranges, lemons, or limes.  Tomato-based foods such as sauce, chili, salsa, and pizza.  Fried and fatty foods.  Eat small, frequent meals instead of large meals. SEEK IMMEDIATE MEDICAL CARE IF:   You have black or dark red stools.  You vomit blood or material that looks like coffee grounds.  You are unable to keep fluids down.  Your abdominal pain gets worse.  You have a fever.  You do not feel better after 1 week.  You have any other questions or concerns. MAKE SURE YOU:  Understand these instructions.  Will watch your condition.  Will get help right away if you are not doing well or get worse. Document Released: 05/17/2001 Document Revised: 11/22/2011 Document Reviewed: 07/06/2011 [facility] Patient Information 2015 [place], [place]. This information is not intended to replace advice given to you by your health care provider. Make sure you discuss any questions you have with your health care provider.

## 2013-12-10 NOTE — ED Notes (Signed)
C/o chest pain x 1.5 days, n/v also.  Thought it would go away.  Took 324mg  asa in ambulance.

## 2013-12-10 NOTE — ED Provider Notes (Signed)
CSN: [id number]     Arrival date & time 12/10/13  0105 History   First MD Initiated Contact with Patient 12/10/13 0135     Chief Complaint  Patient presents with  . Nausea  . Emesis  . Chest Pain     (Consider location/radiation/quality/duration/timing/severity/associated sxs/prior Treatment) HPI Patient presents with 24 hours of epigastric pain that radiates into his lower chest. He's had nausea and several episodes of vomiting. He states the pain is worse after eating or drinking. He's had no fevers or chills. He denies any shortness of breath. Patient has previous history of coronary artery disease requiring bypass grafting and stenting. He denies any recent NSAID usage. Patient denies any gross blood in stool or melena. Past Medical History  Diagnosis Date  . High cholesterol   . Coronary artery disease    Past Surgical History  Procedure Laterality Date  . Coronary artery bypass graft    . Carotid stent     History reviewed. No pertinent family history. History  Substance Use Topics  . Smoking status: Current Every Day Smoker -- 1.00 packs/day for 42 years    Types: Cigarettes  . Smokeless tobacco: Not on file  . Alcohol Use: Yes     Comment: 6 pack beer daily    Review of Systems  Constitutional: Negative for fever and chills.  Respiratory: Negative for cough and shortness of breath.   Cardiovascular: Positive for chest pain. Negative for palpitations and leg swelling.  Gastrointestinal: Positive for nausea, vomiting and abdominal pain. Negative for diarrhea and constipation.  Musculoskeletal: Negative for back pain, neck pain and neck stiffness.  Skin: Negative for rash and wound.  Neurological: Negative for dizziness, weakness, light-headedness, numbness and headaches.  All other systems reviewed and are negative.     Allergies  Penicillins  Home Medications   Prior to Admission medications   Medication Sig Start Date End Date Taking? Authorizing Provider   ALPRAZolam Duanne Moron) 1 MG tablet Take 1 mg by mouth 3 (three) times daily.  11/19/13  Yes Historical Provider, MD  clopidogrel (PLAVIX) 75 MG tablet Take 75 mg by mouth daily.   Yes Historical Provider, MD  HYDROcodone-acetaminophen (NORCO/VICODIN) 5-325 MG per tablet Take 2 tablets by mouth 2 (two) times daily. 11/19/13  Yes Historical Provider, MD   BP 127/90  Pulse 73  Temp(Src) 98.7 F (37.1 C) (Oral)  Resp 12  Ht 6' (1.829 m)  Wt 165 lb (74.844 kg)  BMI 22.37 kg/m2  SpO2 97% Physical Exam  Nursing note and vitals reviewed. Constitutional: He is oriented to person, place, and time. He appears well-developed and well-nourished. No distress.  HENT:  Head: Normocephalic and atraumatic.  Mouth/Throat: Oropharynx is clear and moist.  Eyes: EOM are normal. Pupils are equal, round, and reactive to light.  Neck: Normal range of motion. Neck supple.  Cardiovascular: Normal rate and regular rhythm.   Pulmonary/Chest: Effort normal and breath sounds normal. No respiratory distress. He has no wheezes. He has no rales.  Abdominal: Soft. Bowel sounds are normal. There is tenderness (tenderness to palpation in the epigastric region.).  Musculoskeletal: Normal range of motion. He exhibits no edema and no tenderness.  No calf swelling or tenderness.  Neurological: He is alert and oriented to person, place, and time.  Moves all extremities without deficit. Sensation is grossly intact.  Skin: Skin is warm and dry. No rash noted. No erythema.  Psychiatric: He has a normal mood and affect. His behavior is normal.  ED Course  Procedures (including critical care time) Labs Review Labs Reviewed  CBC - Abnormal; Notable for the following:    MCH 35.5 (*)    All other components within normal limits  BASIC METABOLIC PANEL - Abnormal; Notable for the following:    Potassium 3.4 (*)    Glucose, Bld 108 (*)    Anion gap 20 (*)    All other components within normal limits  HEPATIC FUNCTION PANEL   LIPASE, BLOOD  I-STAT TROPOININ, ED    Imaging Review Dg Chest Port 1 View  12/10/2013   CLINICAL DATA:  Chest pain. Cough. Shortness of breath. Coronary artery disease.  EXAM: PORTABLE CHEST - 1 VIEW  COMPARISON:  12/28/2011  FINDINGS: The heart size and mediastinal contours are within normal limits. Both lungs are clear. No evidence of pleural effusion. Prior CABG again noted.  IMPRESSION: No active disease.   Electronically Signed   By: Earle Gell M.D.   On: 12/10/2013 02:13     EKG Interpretation None      Date: 12/10/2013  Rate: 69  Rhythm: normal sinus rhythm  QRS Axis: normal  Intervals: normal  ST/T Wave abnormalities: nonspecific T wave changes  Conduction Disutrbances:none  Narrative Interpretation:   Old EKG Reviewed: unchanged Diffuse T wave inversions but appears unchanged from EKG in 2012.   MDM   Final diagnoses:  None  Patient states his epigastric pain is improved after GI cocktail. Vital signs remained stable in the emergency department. Delta troponin pending  Patient states his symptoms today are in no way similar to his cardiac symptoms that required bypass surgery. His chest pain is completely resolved and is only complaining of epigastric pain  Patient had a similar presentation in 2012. His EKG is virtually unchanged from then. His epigastric pain was felt to be gastric in origin.  Discussed with Dr. Garret Reddish. Recommend ambulating patient to see if he develops chest pain. If he remains chest pain free, he can followup in the office this week.  Patient ambulates without any chest pain. Discussed with the heart care sections. Leave message to schedule appointment this week. Patient's phone numbers given.  Discussed with patient the need to followup with both cardiology and gastroenterology. His voice understanding. Return precautions have been given.  Julianne Rice, MD 12/10/13 [phone]

## 2014-06-14 ENCOUNTER — Emergency Department (HOSPITAL_COMMUNITY): Payer: Medicare Other

## 2014-06-14 ENCOUNTER — Inpatient Hospital Stay (HOSPITAL_COMMUNITY)
Admission: EM | Admit: 2014-06-14 | Discharge: 2014-06-18 | DRG: 871 | Disposition: A | Discharge status: Home / Self Care | Payer: Medicare Other | Attending: Internal Medicine | Admitting: Internal Medicine

## 2014-06-14 ENCOUNTER — Encounter (HOSPITAL_COMMUNITY): Payer: Self-pay | Admitting: Internal Medicine

## 2014-06-14 DIAGNOSIS — E43 Unspecified severe protein-calorie malnutrition: Secondary | ICD-10-CM | POA: Diagnosis present

## 2014-06-14 DIAGNOSIS — I2510 Atherosclerotic heart disease of native coronary artery without angina pectoris: Secondary | ICD-10-CM | POA: Diagnosis not present

## 2014-06-14 DIAGNOSIS — F17210 Nicotine dependence, cigarettes, uncomplicated: Secondary | ICD-10-CM | POA: Diagnosis present

## 2014-06-14 DIAGNOSIS — A419 Sepsis, unspecified organism: Principal | ICD-10-CM | POA: Diagnosis present

## 2014-06-14 DIAGNOSIS — Z951 Presence of aortocoronary bypass graft: Secondary | ICD-10-CM | POA: Diagnosis not present

## 2014-06-14 DIAGNOSIS — E871 Hypo-osmolality and hyponatremia: Secondary | ICD-10-CM | POA: Diagnosis present

## 2014-06-14 DIAGNOSIS — J1100 Influenza due to unidentified influenza virus with unspecified type of pneumonia: Secondary | ICD-10-CM | POA: Diagnosis present

## 2014-06-14 DIAGNOSIS — Z681 Body mass index (BMI) 19 or less, adult: Secondary | ICD-10-CM

## 2014-06-14 DIAGNOSIS — E860 Dehydration: Secondary | ICD-10-CM | POA: Diagnosis not present

## 2014-06-14 DIAGNOSIS — Y95 Nosocomial condition: Secondary | ICD-10-CM | POA: Diagnosis present

## 2014-06-14 DIAGNOSIS — Z880 Allergy status to penicillin: Secondary | ICD-10-CM | POA: Diagnosis not present

## 2014-06-14 DIAGNOSIS — I771 Stricture of artery: Secondary | ICD-10-CM | POA: Diagnosis present

## 2014-06-14 DIAGNOSIS — F1010 Alcohol abuse, uncomplicated: Secondary | ICD-10-CM | POA: Diagnosis present

## 2014-06-14 DIAGNOSIS — J189 Pneumonia, unspecified organism: Secondary | ICD-10-CM | POA: Diagnosis present

## 2014-06-14 DIAGNOSIS — E780 Pure hypercholesterolemia: Secondary | ICD-10-CM | POA: Diagnosis not present

## 2014-06-14 DIAGNOSIS — E781 Pure hyperglyceridemia: Secondary | ICD-10-CM | POA: Diagnosis not present

## 2014-06-14 DIAGNOSIS — R0789 Other chest pain: Secondary | ICD-10-CM

## 2014-06-14 DIAGNOSIS — Z720 Tobacco use: Secondary | ICD-10-CM | POA: Diagnosis present

## 2014-06-14 DIAGNOSIS — R079 Chest pain, unspecified: Secondary | ICD-10-CM | POA: Diagnosis present

## 2014-06-14 HISTORY — DX: Malignant neoplasm of prostate: C61

## 2014-06-14 HISTORY — DX: Cerebral infarction, unspecified: I63.9

## 2014-06-14 HISTORY — DX: Malignant neoplasm of unspecified site of unspecified female breast: C50.919

## 2014-06-14 HISTORY — DX: Anxiety disorder, unspecified: F41.9

## 2014-06-14 HISTORY — DX: Alcohol abuse, uncomplicated: F10.10

## 2014-06-14 LAB — URINALYSIS, ROUTINE W REFLEX MICROSCOPIC
BILIRUBIN URINE: NEGATIVE
GLUCOSE, UA: NEGATIVE mg/dL
Hgb urine dipstick: NEGATIVE
KETONES UR: 40 mg/dL — AB
Leukocytes, UA: NEGATIVE
Nitrite: NEGATIVE
PH: 6 (ref 5.0–8.0)
PROTEIN: NEGATIVE mg/dL
SPECIFIC GRAVITY, URINE: 1.019 (ref 1.005–1.030)
Urobilinogen, UA: 0.2 mg/dL (ref 0.0–1.0)

## 2014-06-14 LAB — I-STAT TROPONIN, ED: Troponin i, poc: 0.01 ng/mL (ref 0.00–0.08)

## 2014-06-14 LAB — HEPATIC FUNCTION PANEL
ALBUMIN: 3.4 g/dL — AB (ref 3.5–5.2)
ALT: 18 U/L (ref 0–53)
AST: 31 U/L (ref 0–37)
Alkaline Phosphatase: 99 U/L (ref 39–117)
BILIRUBIN INDIRECT: 0.4 mg/dL (ref 0.3–0.9)
BILIRUBIN TOTAL: 0.6 mg/dL (ref 0.3–1.2)
Bilirubin, Direct: 0.2 mg/dL (ref 0.0–0.3)
TOTAL PROTEIN: 6.9 g/dL (ref 6.0–8.3)

## 2014-06-14 LAB — BASIC METABOLIC PANEL
ANION GAP: 14 (ref 5–15)
BUN: 7 mg/dL (ref 6–23)
CO2: 20 mmol/L (ref 19–32)
CREATININE: 0.67 mg/dL (ref 0.50–1.35)
Calcium: 7.8 mg/dL — ABNORMAL LOW (ref 8.4–10.5)
Chloride: 90 mEq/L — ABNORMAL LOW (ref 96–112)
GFR calc Af Amer: >90 mL/min (ref >90)
GFR calc non Af Amer: >90 mL/min (ref >90)
GLUCOSE: 85 mg/dL (ref 70–99)
Potassium: 4.3 mmol/L (ref 3.5–5.1)
Sodium: 124 mmol/L — ABNORMAL LOW (ref 135–145)

## 2014-06-14 LAB — CBC
HCT: 45.5 % (ref 39.0–52.0)
Hemoglobin: 16.5 g/dL (ref 13.0–17.0)
MCH: 34.6 pg — ABNORMAL HIGH (ref 26.0–34.0)
MCHC: 36.3 g/dL — ABNORMAL HIGH (ref 30.0–36.0)
MCV: 95.4 fL (ref 78.0–100.0)
Platelets: 192 10*3/uL (ref 150–400)
RBC: 4.77 MIL/uL (ref 4.22–5.81)
RDW: 12.6 % (ref 11.5–15.5)
WBC: 16.9 10*3/uL — ABNORMAL HIGH (ref 4.0–10.5)

## 2014-06-14 LAB — I-STAT CG4 LACTIC ACID, ED: Lactic Acid, Venous: 3.2 mmol/L — ABNORMAL HIGH (ref 0.5–2.2)

## 2014-06-14 LAB — LIPASE, BLOOD: LIPASE: 18 U/L (ref 11–59)

## 2014-06-14 LAB — ETHANOL: Alcohol, Ethyl (B): 18 mg/dL — ABNORMAL HIGH (ref 0–9)

## 2014-06-14 MED ORDER — OSELTAMIVIR PHOSPHATE 75 MG PO CAPS
75.0000 mg | ORAL_CAPSULE | Freq: Two times a day (BID) | ORAL | Status: DC
  Administered 2014-06-15 (×2): 75 mg via ORAL
  Filled 2014-06-14 (×3): qty 1

## 2014-06-14 MED ORDER — DEXTROSE 5 % IV SOLN
500.0000 mg | Freq: Once | INTRAVENOUS | Status: AC
  Administered 2014-06-14: 500 mg via INTRAVENOUS
  Filled 2014-06-14: qty 500

## 2014-06-14 MED ORDER — ALBUTEROL SULFATE (2.5 MG/3ML) 0.083% IN NEBU
5.0000 mg | INHALATION_SOLUTION | Freq: Once | RESPIRATORY_TRACT | Status: AC
  Administered 2014-06-14: 5 mg via RESPIRATORY_TRACT
  Filled 2014-06-14: qty 6

## 2014-06-14 MED ORDER — THIAMINE HCL 100 MG/ML IJ SOLN
Freq: Once | INTRAVENOUS | Status: AC
  Administered 2014-06-15: 01:00:00 via INTRAVENOUS
  Filled 2014-06-14: qty 1000

## 2014-06-14 MED ORDER — FOLIC ACID 5 MG/ML IJ SOLN
1.0000 mg | Freq: Every day | INTRAMUSCULAR | Status: DC
  Filled 2014-06-14: qty 0.2

## 2014-06-14 MED ORDER — DEXTROSE 5 % IV SOLN
500.0000 mg | Freq: Every day | INTRAVENOUS | Status: DC
  Administered 2014-06-15: 500 mg via INTRAVENOUS
  Filled 2014-06-14: qty 500

## 2014-06-14 MED ORDER — THIAMINE HCL 100 MG/ML IJ SOLN
100.0000 mg | Freq: Every day | INTRAMUSCULAR | Status: DC
  Administered 2014-06-15 – 2014-06-18 (×4): 100 mg via INTRAVENOUS
  Filled 2014-06-14 (×4): qty 1

## 2014-06-14 MED ORDER — ACETAMINOPHEN 325 MG PO TABS
650.0000 mg | ORAL_TABLET | Freq: Once | ORAL | Status: AC
  Administered 2014-06-14: 650 mg via ORAL
  Filled 2014-06-14: qty 2

## 2014-06-14 MED ORDER — PANTOPRAZOLE SODIUM 40 MG PO TBEC
40.0000 mg | DELAYED_RELEASE_TABLET | Freq: Every day | ORAL | Status: DC
  Administered 2014-06-15 – 2014-06-18 (×4): 40 mg via ORAL
  Filled 2014-06-14 (×5): qty 1

## 2014-06-14 MED ORDER — ALPRAZOLAM 1 MG PO TABS
1.0000 mg | ORAL_TABLET | Freq: Three times a day (TID) | ORAL | Status: DC
  Administered 2014-06-15 – 2014-06-18 (×11): 1 mg via ORAL
  Filled 2014-06-14 (×11): qty 1

## 2014-06-14 MED ORDER — ACETAMINOPHEN 325 MG PO TABS
650.0000 mg | ORAL_TABLET | Freq: Four times a day (QID) | ORAL | Status: DC | PRN
  Administered 2014-06-15 – 2014-06-16 (×2): 650 mg via ORAL
  Filled 2014-06-14 (×2): qty 2

## 2014-06-14 MED ORDER — SODIUM CHLORIDE 0.9 % IV BOLUS (SEPSIS)
1000.0000 mL | Freq: Once | INTRAVENOUS | Status: AC
  Administered 2014-06-14: 1000 mL via INTRAVENOUS

## 2014-06-14 MED ORDER — ONDANSETRON HCL 4 MG/2ML IJ SOLN
4.0000 mg | Freq: Once | INTRAMUSCULAR | Status: AC
  Administered 2014-06-14: 4 mg via INTRAVENOUS
  Filled 2014-06-14: qty 2

## 2014-06-14 MED ORDER — ENOXAPARIN SODIUM 40 MG/0.4ML ~~LOC~~ SOLN
40.0000 mg | Freq: Every day | SUBCUTANEOUS | Status: DC
  Administered 2014-06-15 – 2014-06-17 (×4): 40 mg via SUBCUTANEOUS
  Filled 2014-06-14 (×5): qty 0.4

## 2014-06-14 MED ORDER — CLOPIDOGREL BISULFATE 75 MG PO TABS
75.0000 mg | ORAL_TABLET | Freq: Every day | ORAL | Status: DC
  Administered 2014-06-15 – 2014-06-18 (×4): 75 mg via ORAL
  Filled 2014-06-14 (×4): qty 1

## 2014-06-14 MED ORDER — CEFTRIAXONE SODIUM IN DEXTROSE 20 MG/ML IV SOLN
1.0000 g | Freq: Every day | INTRAVENOUS | Status: DC
  Administered 2014-06-15 – 2014-06-16 (×3): 1 g via INTRAVENOUS
  Filled 2014-06-14 (×3): qty 50

## 2014-06-14 MED ORDER — SODIUM CHLORIDE 0.9 % IV SOLN: INTRAVENOUS | Status: AC

## 2014-06-14 MED ORDER — NICOTINE 21 MG/24HR TD PT24
21.0000 mg | MEDICATED_PATCH | Freq: Every day | TRANSDERMAL | Status: DC
  Administered 2014-06-15 – 2014-06-18 (×4): 21 mg via TRANSDERMAL
  Filled 2014-06-14 (×4): qty 1

## 2014-06-14 MED ORDER — DEXTROSE 5 % IV SOLN
1.0000 g | Freq: Once | INTRAVENOUS | Status: AC
  Administered 2014-06-14: 1 g via INTRAVENOUS
  Filled 2014-06-14: qty 10

## 2014-06-14 NOTE — H&P (Addendum)
PCP:  Salena Saner., MD    Chief Complaint: Chest pain nausea and vomiting cough and chills   HPI: [name] Pace is a 64 y.o. male   has a past medical history of High cholesterol; Coronary artery disease; and Alcohol abuse.   Presented with  2-3 days ago Patient developed nausea vomiting chest pain worse with palpation and cough, cough productive of yello green sputum and and chills and body aches. His roommate had something similar. He called EMS was brought to the emergency department. He was found to have infiltrate on chest x-ray. Sodium was noted to be down to 124. Bibasilar, up to 16.9. In ER patient was febrile up to 102.3 with a respiratory rate up to 22 with elevated lactic acid of 3. Meeting sepsis criteria.   Of note patient has history of alcohol abuse. States he  drinks about 6 pack a day states sometimes does not drink denies significant hx of withdrawal, also  Reports have not had his xanax for few days that he takes on regular bases.  During intubate patient ill-appearing actively right during  Hospitalist was called for admission for CAP and sepsis  Review of Systems:    Pertinent positives include:  Fevers, chills, nausea, vomiting,chest pain  Constitutional:  No weight loss, night sweats,fatigue, weight loss  HEENT:  No headaches, Difficulty swallowing,Tooth/dental problems,Sore throat,  No sneezing, itching, ear ache, nasal congestion, post nasal drip,  Cardio-vascular:  No , Orthopnea, PND, anasarca, dizziness, palpitations.no Bilateral lower extremity swelling  GI:  No heartburn, indigestion, abdominal pain,  diarrhea, change in bowel habits, loss of appetite, melena, blood in stool, hematemesis Resp:  no shortness of breath at rest. No dyspnea on exertion, No excess mucus, no productive cough, No non-productive cough, No coughing up of blood.No change in color of mucus.No wheezing. Skin:  no rash or lesions. No jaundice GU:  no dysuria, change in  color of urine, no urgency or frequency. No straining to urinate.  No flank pain.  Musculoskeletal:  No joint pain or no joint swelling. No decreased range of motion. No back pain.  Psych:  No change in mood or affect. No depression or anxiety. No memory loss.  Neuro: no localizing neurological complaints, no tingling, no weakness, no double vision, no gait abnormality, no slurred speech, no confusion  Otherwise ROS are negative except for above, 10 systems were reviewed  Past Medical History: Past Medical History  Diagnosis Date  . High cholesterol   . Coronary artery disease   . Alcohol abuse    Past Surgical History  Procedure Laterality Date  . Coronary artery bypass graft    . Carotid stent       Medications: Prior to Admission medications   Medication Sig Start Date End Date Taking? Authorizing Provider  ALPRAZolam Duanne Moron) 1 MG tablet Take 1 mg by mouth 3 (three) times daily.  11/19/13  Yes Historical Provider, MD  clopidogrel (PLAVIX) 75 MG tablet Take 75 mg by mouth daily.   Yes Historical Provider, MD  omeprazole (PRILOSEC) 40 MG capsule Take 1 capsule (40 mg total) by mouth daily. 12/10/13  Yes Julianne Rice, MD  ondansetron (ZOFRAN ODT) 4 MG disintegrating tablet 4mg  ODT q4 hours prn nausea/vomit 12/10/13  Yes Julianne Rice, MD    Allergies:   Allergies  Allergen Reactions  . Penicillins Swelling    Social History:  Ambulatory   Independently  Lives at home with roommate     reports that he has been  smoking Cigarettes.  He has a 42 pack-year smoking history. He does not have any smokeless tobacco history on file. He reports that he drinks alcohol. He reports that he does not use illicit drugs.    Family History: family history is not on file.    Physical Exam: Patient Vitals for the past 24 hrs:  BP Temp Temp src Pulse Resp SpO2  06/14/14 2049 - - - - - 97 %  06/14/14 1903 - 102.3 F (39.1 C) Rectal - - -  06/14/14 1900 146/58 mmHg - - 81 18 95 %    06/14/14 1841 (!) 155/47 mmHg 98.1 F (36.7 C) Oral 81 22 95 %    1. General:  in No Acute distress, continues to have rigors. 2. Psychological: Alert and   Oriented 3. Head/ENT:     Dry Mucous Membranes                          Head Non traumatic, neck supple                           Poor Dentition 4. SKIN:  decreased Skin turgor,  Skin clean Dry and intact no rash 5. Heart: Regular rate and rhythm no Murmur, Rub or gallop 6. Lungs:  no wheezes some crackles   7. Abdomen: Soft, non-tender, Non distended 8. Lower extremities: no clubbing, cyanosis, or edema 9. Neurologically Grossly intact, moving all 4 extremities equally 10. MSK: Normal range of motion  body mass index is unknown because there is no weight on file.   Labs on Admission:   Results for orders placed or performed during the hospital encounter of 06/14/14 (from the past 24 hour(s))  CBC     Status: Abnormal   Collection Time: 06/14/14  6:55 PM  Result Value Ref Range   WBC 16.9 (H) 4.0 - 10.5 K/uL   RBC 4.77 4.22 - 5.81 MIL/uL   Hemoglobin 16.5 13.0 - 17.0 g/dL   HCT 45.5 39.0 - 52.0 %   MCV 95.4 78.0 - 100.0 fL   MCH 34.6 (H) 26.0 - 34.0 pg   MCHC 36.3 (H) 30.0 - 36.0 g/dL   RDW 12.6 11.5 - 15.5 %   Platelets 192 150 - 400 K/uL  Ethanol     Status: Abnormal   Collection Time: 06/14/14  7:00 PM  Result Value Ref Range   Alcohol, Ethyl (B) 18 (H) 0 - 9 mg/dL  I-stat troponin, ED (not at [facility])     Status: None   Collection Time: 06/14/14  7:10 PM  Result Value Ref Range   Troponin i, poc 0.01 0.00 - 0.08 ng/mL   Comment 3          Lipase, blood     Status: None   Collection Time: 06/14/14  7:12 PM  Result Value Ref Range   Lipase 18 11 - 59 U/L  Hepatic function panel     Status: Abnormal   Collection Time: 06/14/14  7:12 PM  Result Value Ref Range   Total Protein 6.9 6.0 - 8.3 g/dL   Albumin 3.4 (L) 3.5 - 5.2 g/dL   AST 31 0 - 37 U/L   ALT 18 0 - 53 U/L   Alkaline Phosphatase 99 39 - 117 U/L    Total Bilirubin 0.6 0.3 - 1.2 mg/dL   Bilirubin, Direct 0.2 0.0 - 0.3 mg/dL   Indirect Bilirubin 0.4  0.3 - 0.9 mg/dL  I-Stat CG4 Lactic Acid, ED     Status: Abnormal   Collection Time: 06/14/14  7:16 PM  Result Value Ref Range   Lactic Acid, Venous 3.20 (H) 0.5 - 2.2 mmol/L  Basic metabolic panel     Status: Abnormal   Collection Time: 06/14/14  7:32 PM  Result Value Ref Range   Sodium 124 (L) 135 - 145 mmol/L   Potassium 4.3 3.5 - 5.1 mmol/L   Chloride 90 (L) 96 - 112 mEq/L   CO2 20 19 - 32 mmol/L   Glucose, Bld 85 70 - 99 mg/dL   BUN 7 6 - 23 mg/dL   Creatinine, Ser 0.67 0.50 - 1.35 mg/dL   Calcium 7.8 (L) 8.4 - 10.5 mg/dL   GFR calc non Af Amer >90 >90 mL/min   GFR calc Af Amer >90 >90 mL/min   Anion gap 14 5 - 15    UA not obtained  No results found for: HGBA1C  CrCl cannot be calculated (Unknown ideal weight.).  BNP (last 3 results) No results for input(s): PROBNP in the last 8760 hours.  Other results:  I have pearsonaly reviewed this: ECG REPORT  Rate: 69  Rhythm: Sinus rhythm with PACs evidence of LVH ST&T Change: Diffuse ST depressions unchanged from EKG from 2012   There were no vitals filed for this visit.   Cultures:    Component Value Date/Time   SDES BLOOD RIGHT HAND 11/16/2010 0555   SPECREQUEST BOTTLES DRAWN AEROBIC AND ANAEROBIC 10CC EACH 11/16/2010 0555   CULT NO GROWTH 5 DAYS 11/16/2010 0555   REPTSTATUS 11/22/2010 FINAL 11/16/2010 0555     Radiological Exams on Admission: Dg Chest 2 View  06/14/2014   CLINICAL DATA:  History of CAD.  Coronary bypass graft.  EXAM: CHEST  2 VIEW  COMPARISON:  Radiograph 12/10/2013  FINDINGS: Sternotomy wires overlie normal cardiac silhouette. There is new fine airspace disease in the left lower lobe. No pleural fluid. No pneumothorax.  IMPRESSION: New left lower lobe pneumonia.   Electronically Signed   By: Suzy Bouchard M.D.   On: 06/14/2014 19:44    Chart has been reviewed  Assessment/Plan  64  year-old gentleman history of alcohol abuse,   coronary artery disease, presents with respiratory symptoms and fever found to have sepsis most likely secondary to community-acquired pneumonia. Patient was also noted to be hyponatremic. Given above-mentioned complications patient is admitted  for further evaluation and monitoring overnight Present on Admission:   . CAP (community acquired pneumonia) -  - will admit for treatment of CAP will start on appropriate antibiotic coverage. Given myalgias with chest for influenza., Tamiflu for now until results are back   Obtain sputum cultures, blood cultures, .  Provide oxygen as needed.  . Chest pain- atypical worse with cough. Given history of coronary disease with cycle cardiac enzymes  . Hyponatremia - the setting of alcohol abuse, dehydration, and respiratory illness. We'll obtain urine electrolytes give IV fluids  . Sepsis - administer IV fluids, IV antibiotics, obtain blood cultures, protocols and tone in, lactic acid repeat. Admit to step down  . Alcohol abuse - unsure if patient have had significant withdrawal symptoms he has denied this. Of note he is also recently been out of his Xanax as well. We will monitor and step down in case developed significant withdrawal symptoms complicating current medical issues  . CAD (coronary artery disease) - continue Plavix  . Tobacco abuse spoke about importance of quitting.  Write for nicotine patch    Prophylaxis:  Lovenox, Protonix  CODE STATUS:  FULL CODE    Other plan as per orders.  I have spent a total of 55 min on this admission  Jag Lenz 06/14/2014, 9:28 PM  [facility]  Pager [phone]   after 2 AM please page floor coverage PA If 7AM-7PM, please contact the day team taking care of the patient  [email or web address]  Password [email or web address]

## 2014-06-14 NOTE — ED Notes (Signed)
Bed assignment has been changed to Tele per MD's , Charge Nurse made aware, awaiting for bed assignment.

## 2014-06-14 NOTE — ED Notes (Signed)
Bed: WA09 Expected date: 06/14/14 Expected time: 6:23 PM Means of arrival: Ambulance Comments: Weakness

## 2014-06-14 NOTE — ED Notes (Signed)
Pt is unable to urinate at this time. 

## 2014-06-14 NOTE — ED Notes (Addendum)
Per ems pt c/o nausea and vomiting, productive cough, chest pain with palpation, generalized abdominal pain, body aches, and chills Upon rn assessment pt reports generalized pain 5/10.

## 2014-06-14 NOTE — ED Provider Notes (Signed)
CSN: [id number]     Arrival date & time 06/14/14  1833 History   First MD Initiated Contact with Patient 06/14/14 1844     Chief Complaint  Patient presents with  . Emesis  . Chest Pain  . body aches      (Consider location/radiation/quality/duration/timing/severity/associated sxs/prior Treatment) HPI Comments: Patient presents today with a chief complaint of productive cough, chest pain, body aches, nausea, vomiting, and epigastric abdominal pain.  He reports onset of symptoms two days ago.  Symptoms gradually worsening.  He has not taken anything for symptoms prior to arrival.  He reports substernal chest pain, but states that he only has the pain with coughing.  Pain does not radiate.  He is unsure if he has a fever.  Rectal temp is 102.3 F orally upon arrival in the ED.  He also reports mild associated SOB.  He denies diarrhea, numbness, tingling, hematemesis, hemoptysis, dizziness, lightheadedness, or syncope.  He reports that he currently drinks six beers a day.  He also smokes 1 PPD.    He states that this roommate has similar symptoms.    The history is provided by the patient.    Past Medical History  Diagnosis Date  . High cholesterol   . Coronary artery disease    Past Surgical History  Procedure Laterality Date  . Coronary artery bypass graft    . Carotid stent     No family history on file. History  Substance Use Topics  . Smoking status: Current Every Day Smoker -- 1.00 packs/day for 42 years    Types: Cigarettes  . Smokeless tobacco: Not on file  . Alcohol Use: Yes     Comment: 6 pack beer daily    Review of Systems  All other systems reviewed and are negative.     Allergies  Penicillins  Home Medications   Prior to Admission medications   Medication Sig Start Date End Date Taking? Authorizing Provider  ALPRAZolam Duanne Moron) 1 MG tablet Take 1 mg by mouth 3 (three) times daily.  11/19/13  Yes Historical Provider, MD  clopidogrel (PLAVIX) 75 MG tablet Take  75 mg by mouth daily.   Yes Historical Provider, MD  omeprazole (PRILOSEC) 40 MG capsule Take 1 capsule (40 mg total) by mouth daily. 12/10/13  Yes Julianne Rice, MD  ondansetron (ZOFRAN ODT) 4 MG disintegrating tablet 4mg  ODT q4 hours prn nausea/vomit 12/10/13  Yes Julianne Rice, MD   BP 146/58 mmHg  Pulse 81  Temp(Src) 102.3 F (39.1 C) (Rectal)  Resp 18  SpO2 95% Physical Exam  Constitutional: He appears well-developed and well-nourished.  HENT:  Head: Normocephalic and atraumatic.  Mouth/Throat: Oropharynx is clear and moist.  Neck: Normal range of motion. Neck supple.  Cardiovascular: Normal rate, regular rhythm and normal heart sounds.   Pulmonary/Chest: Effort normal. No respiratory distress. He has wheezes. He has no rales. He exhibits tenderness.  Patient speaking in complete sentences.  Abdominal: Soft. Bowel sounds are normal. He exhibits no distension and no mass. There is tenderness in the epigastric area. There is no rebound and no guarding.  Musculoskeletal: Normal range of motion.  No LE edema bilaterally  Neurological: He is alert.  Skin: Skin is warm and dry.  Psychiatric: He has a normal mood and affect.  Nursing note and vitals reviewed.   ED Course  Procedures (including critical care time) Labs Review Labs Reviewed  CBC - Abnormal; Notable for the following:    WBC 16.9 (*)  MCH 34.6 (*)    MCHC 36.3 (*)    All other components within normal limits  I-STAT CG4 LACTIC ACID, ED - Abnormal; Notable for the following:    Lactic Acid, Venous 3.20 (*)    All other components within normal limits  LIPASE, BLOOD  HEPATIC FUNCTION PANEL  ETHANOL  BASIC METABOLIC PANEL  URINALYSIS, ROUTINE W REFLEX MICROSCOPIC  I-STAT TROPOININ, ED    Imaging Review Dg Chest 2 View  06/14/2014   CLINICAL DATA:  History of CAD.  Coronary bypass graft.  EXAM: CHEST  2 VIEW  COMPARISON:  Radiograph 12/10/2013  FINDINGS: Sternotomy wires overlie normal cardiac silhouette.  There is new fine airspace disease in the left lower lobe. No pleural fluid. No pneumothorax.  IMPRESSION: New left lower lobe pneumonia.   Electronically Signed   By: Suzy Bouchard M.D.   On: 06/14/2014 19:44     EKG Interpretation   Date/Time:  Saturday June 14 2014 18:41:54 EST Ventricular Rate:  76 PR Interval:  133 QRS Duration: 104 QT Interval:  449 QTC Calculation: 505 R Axis:   75 Text Interpretation:  Sinus rhythm Atrial premature complex Probable left  atrial enlargement Repol abnrm suggests ischemia, lateral leads Prolonged  QT interval Sinus rhythm Premature atrial complexes QT prolonged Abnormal  ekg Confirmed by Carmin Muskrat  MD [phone]) on 06/14/2014 8:19:32 PM      MDM   Final diagnoses:  Chest pain   Patient presents today with various complaints, but is primarily complaining of productive cough and SOB.  CXR showing Pneumonia.  No hospitalizations in the past 90 days.  Therefore, patient treated for CAP with Ceftriaxone and Azithromycin.  Patient with O2 sats around 93 at rest in the ED and is also vomiting.  Therefore, feel that the patient would benefit from admission for further treatment and monitoring.  Patient with chest pain, but only with coughing.  Therefore, doubt ACS.  Patient also with epigastric ab pain, which is likely from vomiting.  No RUQ tenderness on exam.  No rebound or guarding.  Therefore, do not feel that abdominal imaging is indicated.  Patient admitted to [place], PA-C 06/16/14 2212  Carmin Muskrat, MD 06/17/14 [phone]

## 2014-06-14 NOTE — ED Notes (Addendum)
Hospitalist at bedside 

## 2014-06-15 ENCOUNTER — Encounter (HOSPITAL_COMMUNITY): Payer: Self-pay

## 2014-06-15 DIAGNOSIS — I708 Atherosclerosis of other arteries: Secondary | ICD-10-CM

## 2014-06-15 DIAGNOSIS — E871 Hypo-osmolality and hyponatremia: Secondary | ICD-10-CM

## 2014-06-15 DIAGNOSIS — I771 Stricture of artery: Secondary | ICD-10-CM | POA: Diagnosis present

## 2014-06-15 DIAGNOSIS — Z720 Tobacco use: Secondary | ICD-10-CM

## 2014-06-15 LAB — CBC
HEMATOCRIT: 39.3 % (ref 39.0–52.0)
Hemoglobin: 14 g/dL (ref 13.0–17.0)
MCH: 33.9 pg (ref 26.0–34.0)
MCHC: 35.6 g/dL (ref 30.0–36.0)
MCV: 95.2 fL (ref 78.0–100.0)
Platelets: 170 10*3/uL (ref 150–400)
RBC: 4.13 MIL/uL — ABNORMAL LOW (ref 4.22–5.81)
RDW: 12.5 % (ref 11.5–15.5)
WBC: 14.3 10*3/uL — ABNORMAL HIGH (ref 4.0–10.5)

## 2014-06-15 LAB — INFLUENZA PANEL BY PCR (TYPE A & B)
H1N1 flu by pcr: NOT DETECTED
Influenza A By PCR: NEGATIVE
Influenza B By PCR: NEGATIVE

## 2014-06-15 LAB — COMPREHENSIVE METABOLIC PANEL
ALBUMIN: 2.7 g/dL — AB (ref 3.5–5.2)
ALK PHOS: 65 U/L (ref 39–117)
ALT: 15 U/L (ref 0–53)
AST: 24 U/L (ref 0–37)
Anion gap: 6 (ref 5–15)
BILIRUBIN TOTAL: 0.9 mg/dL (ref 0.3–1.2)
BUN: 8 mg/dL (ref 6–23)
CALCIUM: 6.9 mg/dL — AB (ref 8.4–10.5)
CO2: 20 mmol/L (ref 19–32)
Chloride: 98 mEq/L (ref 96–112)
Creatinine, Ser: 0.6 mg/dL (ref 0.50–1.35)
GFR calc Af Amer: >90 mL/min (ref >90)
GFR calc non Af Amer: >90 mL/min (ref >90)
Glucose, Bld: 97 mg/dL (ref 70–99)
Potassium: 3.9 mmol/L (ref 3.5–5.1)
SODIUM: 124 mmol/L — AB (ref 135–145)
TOTAL PROTEIN: 5.5 g/dL — AB (ref 6.0–8.3)

## 2014-06-15 LAB — TROPONIN I: Troponin I: <0.03 ng/mL (ref <0.031)

## 2014-06-15 LAB — PROTIME-INR
INR: 1.07 (ref 0.00–1.49)
Prothrombin Time: 14 seconds (ref 11.6–15.2)

## 2014-06-15 LAB — OSMOLALITY, URINE: OSMOLALITY UR: 608 mosm/kg (ref 390–1090)

## 2014-06-15 LAB — PHOSPHORUS
Phosphorus: 3 mg/dL (ref 2.3–4.6)
Phosphorus: 2.7 mg/dL (ref 2.3–4.6)

## 2014-06-15 LAB — MAGNESIUM
Magnesium: 1.3 mg/dL — ABNORMAL LOW (ref 1.5–2.5)
Magnesium: 1.6 mg/dL (ref 1.5–2.5)

## 2014-06-15 LAB — CREATININE, SERUM
Creatinine, Ser: 0.64 mg/dL (ref 0.50–1.35)
GFR calc Af Amer: >90 mL/min (ref >90)

## 2014-06-15 LAB — EXPECTORATED SPUTUM ASSESSMENT W GRAM STAIN, RFLX TO RESP C: Special Requests: NORMAL

## 2014-06-15 LAB — LACTIC ACID, PLASMA: LACTIC ACID, VENOUS: 2.4 mmol/L — AB (ref 0.5–2.2)

## 2014-06-15 LAB — PROCALCITONIN: Procalcitonin: 2.47 ng/mL

## 2014-06-15 LAB — EXPECTORATED SPUTUM ASSESSMENT W REFEX TO RESP CULTURE

## 2014-06-15 LAB — STREP PNEUMONIAE URINARY ANTIGEN: Strep Pneumo Urinary Antigen: NEGATIVE

## 2014-06-15 MED ORDER — MAGNESIUM SULFATE 2 GM/50ML IV SOLN
2.0000 g | Freq: Once | INTRAVENOUS | Status: AC
  Administered 2014-06-15: 2 g via INTRAVENOUS
  Filled 2014-06-15: qty 50

## 2014-06-15 MED ORDER — VANCOMYCIN HCL IN DEXTROSE 1-5 GM/200ML-% IV SOLN
1000.0000 mg | Freq: Once | INTRAVENOUS | Status: AC
  Administered 2014-06-15: 1000 mg via INTRAVENOUS
  Filled 2014-06-15: qty 200

## 2014-06-15 MED ORDER — ENSURE COMPLETE PO LIQD
237.0000 mL | Freq: Two times a day (BID) | ORAL | Status: DC
  Administered 2014-06-15 – 2014-06-18 (×8): 237 mL via ORAL

## 2014-06-15 MED ORDER — FOLIC ACID 1 MG PO TABS
1.0000 mg | ORAL_TABLET | Freq: Every day | ORAL | Status: DC
  Administered 2014-06-15 – 2014-06-18 (×4): 1 mg via ORAL
  Filled 2014-06-15 (×4): qty 1

## 2014-06-15 MED ORDER — ALBUTEROL SULFATE (2.5 MG/3ML) 0.083% IN NEBU
2.5000 mg | INHALATION_SOLUTION | RESPIRATORY_TRACT | Status: DC | PRN
  Administered 2014-06-15: 2.5 mg via RESPIRATORY_TRACT
  Filled 2014-06-15: qty 3

## 2014-06-15 MED ORDER — PNEUMOCOCCAL VAC POLYVALENT 25 MCG/0.5ML IJ INJ
0.5000 mL | INJECTION | INTRAMUSCULAR | Status: AC
  Administered 2014-06-16: 0.5 mL via INTRAMUSCULAR
  Filled 2014-06-15 (×2): qty 0.5

## 2014-06-15 MED ORDER — AZITHROMYCIN 500 MG PO TABS
500.0000 mg | ORAL_TABLET | Freq: Every day | ORAL | Status: DC
  Administered 2014-06-15 – 2014-06-17 (×3): 500 mg via ORAL
  Filled 2014-06-15 (×3): qty 1

## 2014-06-15 MED ORDER — DEXTROMETHORPHAN POLISTIREX 30 MG/5ML PO LQCR
15.0000 mg | Freq: Four times a day (QID) | ORAL | Status: DC | PRN
  Administered 2014-06-15 – 2014-06-18 (×6): 15 mg via ORAL
  Filled 2014-06-15 (×7): qty 5

## 2014-06-15 MED ORDER — VANCOMYCIN HCL IN DEXTROSE 750-5 MG/150ML-% IV SOLN
750.0000 mg | Freq: Three times a day (TID) | INTRAVENOUS | Status: DC
  Administered 2014-06-15 – 2014-06-17 (×7): 750 mg via INTRAVENOUS
  Filled 2014-06-15 (×7): qty 150

## 2014-06-15 NOTE — Progress Notes (Signed)
ANTIBIOTIC CONSULT NOTE - INITIAL  Pharmacy Consult for vancomycin Indication: Sepsis  Allergies  Allergen Reactions  . Penicillins Swelling    Patient Measurements: Height: 6\' 4"  (193 cm) Weight: 149 lb 0.5 oz (67.6 kg) IBW/kg (Calculated) : 86.8 Adjusted Body Weight:   Vital Signs: Temp: 101.4 F (38.6 C) (01/10 0225) Temp Source: Rectal (01/10 0225) BP: 97/54 mmHg (01/10 0225) Pulse Rate: 80 (01/10 0225) Intake/Output from previous day: 01/09 0701 - 01/10 0700 In: 1350 [I.V.:1350] Out: -  Intake/Output from this shift: Total I/O In: 1350 [I.V.:1350] Out: -   Labs:  Recent Labs  06/14/14 1855 06/14/14 1932 06/15/14 0200  WBC 16.9*  --  14.3*  HGB 16.5  --  14.0  PLT 192  --  170  CREATININE  --  0.67 0.64   Estimated Creatinine Clearance: 92.7 mL/min (by C-G formula based on Cr of 0.64). No results for input(s): VANCOTROUGH, VANCOPEAK, VANCORANDOM, GENTTROUGH, GENTPEAK, GENTRANDOM, TOBRATROUGH, TOBRAPEAK, TOBRARND, AMIKACINPEAK, AMIKACINTROU, AMIKACIN in the last 72 hours.   Microbiology: No results found for this or any previous visit (from the past 720 hour(s)).  Medical History: Past Medical History  Diagnosis Date  . High cholesterol   . Coronary artery disease   . Alcohol abuse   . Stroke   . Anxiety   . Prostate cancer     per patient diagnosed in early 62's  . Breast cancer Left breast    Per patient diagnosed in early 41's.     Medications:  Anti-infectives    Start     Dose/Rate Route Frequency Ordered Stop   06/15/14 1400  vancomycin (VANCOCIN) IVPB 750 mg/150 ml premix     750 mg150 mL/hr over 60 Minutes Intravenous Every 8 hours 06/15/14 0611     06/15/14 0330  azithromycin (ZITHROMAX) tablet 500 mg     500 mg Oral Daily 06/15/14 0327     06/15/14 0015  vancomycin (VANCOCIN) IVPB 1000 mg/200 mL premix     1,000 mg200 mL/hr over 60 Minutes Intravenous  Once 06/15/14 0006 06/15/14 0506   06/15/14 0000  cefTRIAXone (ROCEPHIN) 1 g in  dextrose 5 % 50 mL IVPB - Premix     1 g100 mL/hr over 30 Minutes Intravenous Daily at bedtime 06/14/14 2332 06/21/14 2159   06/15/14 0000  azithromycin (ZITHROMAX) 500 mg in dextrose 5 % 250 mL IVPB  Status:  Discontinued     500 mg250 mL/hr over 60 Minutes Intravenous Daily at bedtime 06/14/14 2332 06/15/14 0327   06/14/14 2345  oseltamivir (TAMIFLU) capsule 75 mg     75 mg Oral 2 times daily 06/14/14 2332 06/19/14 2159   06/14/14 2015  cefTRIAXone (ROCEPHIN) 1 g in dextrose 5 % 50 mL IVPB     1 g100 mL/hr over 30 Minutes Intravenous  Once 06/14/14 2011 06/14/14 2132   06/14/14 2015  azithromycin (ZITHROMAX) 500 mg in dextrose 5 % 250 mL IVPB     500 mg250 mL/hr over 60 Minutes Intravenous  Once 06/14/14 2011 06/14/14 2300     Assessment: Patient with sepsis/CAP.  First dose of antibiotics already given.  Goal of Therapy:  Vancomycin trough level 15-20 mcg/ml  Plan:  Measure antibiotic drug levels at steady state Follow up culture results Vancomycin 750mg  iv q8hr  Tyler Deis, Shea Stakes [place] 06/15/2014,6:19 AM

## 2014-06-15 NOTE — Progress Notes (Signed)
[facility] PROGRESS NOTE  [name] Pace OAC:[id number] DOB: Jun 22, 1950 DOA: 06/14/2014 PCP: Salena Saner., MD  Assessment/Plan: . CAP (community acquired pneumonia) - - Continue on rocephin and azithromycin. On min O2 support. . Chest pain- atypical worse with cough. Likely musculoskeletal. Serial enzymes neg x 3 . Hyponatremia - the setting of alcohol abuse, dehydration, and respiratory illness. For now, cont on saline. Pending urine sodium . Sepsis - Cont IV fluids, IV antibiotics  . Alcohol abuse - Elevated etoh levels on admit. Pt is continued on CIWA. Tremors noted during encounter  . CAD (coronary artery disease) - continue Plavix  . Tobacco abuse -. Cont on nicotine patch   Code Status: Full Family Communication: Pt in room (indicate person spoken with, relationship, and if by phone, the number) Disposition Plan: pending   Consultants:  none  Procedures:  none  Antibiotics:  Rocephin 1/9>>>  azithromycin 1/9>>>  HPI/Subjective: No acute events noted overnight  Objective: Filed Vitals:   06/15/14 0047 06/15/14 0225 06/15/14 0631 06/15/14 1509  BP: 72/54 97/54 140/54 98/40  Pulse:  80 85 69  Temp:  101.4 F (38.6 C) 100.4 F (38 C) 99.8 F (37.7 C)  TempSrc:  Rectal Rectal Oral  Resp:  20 18 19   Height:      Weight:      SpO2:  96% 99% 98%    Intake/Output Summary (Last 24 hours) at 06/15/14 1633 Last data filed at 06/15/14 1510  Gross per 24 hour  Intake   1960 ml  Output   1150 ml  Net    810 ml   Filed Weights   06/14/14 2321  Weight: 67.6 kg (149 lb 0.5 oz)    Exam:   General:  Awake, in nad  Cardiovascular: regular, s1, s2  Respiratory: normal resp effort, no wheezing  Abdomen: soft,nondistended  Musculoskeletal: perfused, no clubbing   Data Reviewed: Basic Metabolic Panel:  Recent Labs Lab 06/14/14 1932 06/15/14 0200 06/15/14 0754  NA 124*  --  124*  K 4.3  --  3.9  CL 90*  --  98  CO2 20  --  20  GLUCOSE  85  --  97  BUN 7  --  8  CREATININE 0.67 0.64 0.60  CALCIUM 7.8*  --  6.9*  MG  --  1.3* 1.6  PHOS  --  3.0 2.7   Liver Function Tests:  Recent Labs Lab 06/14/14 1912 06/15/14 0754  AST 31 24  ALT 18 15  ALKPHOS 99 65  BILITOT 0.6 0.9  PROT 6.9 5.5*  ALBUMIN 3.4* 2.7*    Recent Labs Lab 06/14/14 1912  LIPASE 18   No results for input(s): AMMONIA in the last 168 hours. CBC:  Recent Labs Lab 06/14/14 1855 06/15/14 0200  WBC 16.9* 14.3*  HGB 16.5 14.0  HCT 45.5 39.3  MCV 95.4 95.2  PLT 192 170   Cardiac Enzymes:  Recent Labs Lab 06/15/14 0200 06/15/14 0754 06/15/14 1437  TROPONINI <0.03 <0.03 <0.03   BNP (last 3 results) No results for input(s): PROBNP in the last 8760 hours. CBG: No results for input(s): GLUCAP in the last 168 hours.  No results found for this or any previous visit (from the past 240 hour(s)).   Studies: Dg Chest 2 View  06/14/2014   CLINICAL DATA:  History of CAD.  Coronary bypass graft.  EXAM: CHEST  2 VIEW  COMPARISON:  Radiograph 12/10/2013  FINDINGS: Sternotomy wires overlie normal cardiac silhouette. There is new  fine airspace disease in the left lower lobe. No pleural fluid. No pneumothorax.  IMPRESSION: New left lower lobe pneumonia.   Electronically Signed   By: Suzy Bouchard M.D.   On: 06/14/2014 19:44    Scheduled Meds: . ALPRAZolam  1 mg Oral TID  . azithromycin  500 mg Oral Daily  . cefTRIAXone (ROCEPHIN)  IV  1 g Intravenous QHS  . clopidogrel  75 mg Oral Daily  . enoxaparin (LOVENOX) injection  40 mg Subcutaneous QHS  . feeding supplement (ENSURE COMPLETE)  237 mL Oral BID BM  . folic acid  1 mg Oral Daily  . nicotine  21 mg Transdermal Daily  . pantoprazole  40 mg Oral Daily  . [START ON 06/16/2014] pneumococcal 23 valent vaccine  0.5 mL Intramuscular Tomorrow-1000  . thiamine  100 mg Intravenous Daily  . vancomycin  750 mg Intravenous Q8H   Continuous Infusions:   Active Problems:   Chest pain   CAP  (community acquired pneumonia)   Hyponatremia   Sepsis   Alcohol abuse   CAD (coronary artery disease)   Tobacco abuse   Subclavian artery stenosis, left  Time spent: 62min  [facility], [place] [facility] Pager [phone]. If 7PM-7AM, please contact night-coverage at [email or web address], password [facility] 06/15/2014, 4:33 PM  LOS: 1 day

## 2014-06-15 NOTE — Progress Notes (Signed)
Low BP in left arm due to left subclavian artery stenosis. MD aware. Will continue to obtain BP in right arm.

## 2014-06-16 DIAGNOSIS — A419 Sepsis, unspecified organism: Secondary | ICD-10-CM | POA: Diagnosis not present

## 2014-06-16 LAB — CBC
HCT: 39.3 % (ref 39.0–52.0)
Hemoglobin: 13.6 g/dL (ref 13.0–17.0)
MCH: 33.3 pg (ref 26.0–34.0)
MCHC: 34.6 g/dL (ref 30.0–36.0)
MCV: 96.3 fL (ref 78.0–100.0)
PLATELETS: 154 10*3/uL (ref 150–400)
RBC: 4.08 MIL/uL — ABNORMAL LOW (ref 4.22–5.81)
RDW: 12.8 % (ref 11.5–15.5)
WBC: 13.2 10*3/uL — AB (ref 4.0–10.5)

## 2014-06-16 LAB — CREATININE, URINE, RANDOM: Creatinine, Urine: 158.9 mg/dL

## 2014-06-16 LAB — COMPREHENSIVE METABOLIC PANEL
ALK PHOS: 62 U/L (ref 39–117)
ALT: 12 U/L (ref 0–53)
ANION GAP: 6 (ref 5–15)
AST: 18 U/L (ref 0–37)
Albumin: 2.5 g/dL — ABNORMAL LOW (ref 3.5–5.2)
BILIRUBIN TOTAL: 0.4 mg/dL (ref 0.3–1.2)
BUN: 9 mg/dL (ref 6–23)
CALCIUM: 7.4 mg/dL — AB (ref 8.4–10.5)
CO2: 23 mmol/L (ref 19–32)
Chloride: 99 mEq/L (ref 96–112)
Creatinine, Ser: 0.6 mg/dL (ref 0.50–1.35)
GFR calc non Af Amer: >90 mL/min (ref >90)
GLUCOSE: 143 mg/dL — AB (ref 70–99)
Potassium: 3.6 mmol/L (ref 3.5–5.1)
SODIUM: 128 mmol/L — AB (ref 135–145)
TOTAL PROTEIN: 5.2 g/dL — AB (ref 6.0–8.3)

## 2014-06-16 LAB — PROCALCITONIN: PROCALCITONIN: 3.87 ng/mL

## 2014-06-16 LAB — TROPONIN I: Troponin I: <0.03 ng/mL (ref <0.031)

## 2014-06-16 LAB — SODIUM, URINE, RANDOM: Sodium, Ur: 27 mEq/L

## 2014-06-16 MED ORDER — SODIUM CHLORIDE 0.9 % IJ SOLN: 10.0000 mL | INTRAMUSCULAR | Status: DC | PRN

## 2014-06-16 MED ORDER — ALPRAZOLAM 0.5 MG PO TABS
0.5000 mg | ORAL_TABLET | Freq: Once | ORAL | Status: AC
  Administered 2014-06-16: 0.5 mg via ORAL
  Filled 2014-06-16: qty 1

## 2014-06-16 MED ORDER — KETOROLAC TROMETHAMINE 15 MG/ML IJ SOLN
15.0000 mg | Freq: Four times a day (QID) | INTRAMUSCULAR | Status: DC | PRN
  Administered 2014-06-16 – 2014-06-18 (×5): 15 mg via INTRAVENOUS
  Filled 2014-06-16 (×5): qty 1

## 2014-06-16 MED ORDER — SODIUM CHLORIDE 0.9 % IJ SOLN
10.0000 mL | INTRAMUSCULAR | Status: DC | PRN
  Administered 2014-06-18: 10 mL
  Filled 2014-06-16: qty 40

## 2014-06-16 MED ORDER — HYDROCODONE-HOMATROPINE 5-1.5 MG/5ML PO SYRP
5.0000 mL | ORAL_SOLUTION | ORAL | Status: AC | PRN
  Administered 2014-06-16 (×3): 5 mL via ORAL
  Filled 2014-06-16 (×3): qty 5

## 2014-06-16 MED ORDER — SODIUM CHLORIDE 0.9 % IJ SOLN
10.0000 mL | Freq: Two times a day (BID) | INTRAMUSCULAR | Status: DC
  Administered 2014-06-17: 10 mL

## 2014-06-16 NOTE — Progress Notes (Deleted)
Peripherally Inserted Central Catheter/Midline Placement  The IV Nurse has discussed with the patient and/or persons authorized to consent for the patient, the purpose of this procedure and the potential benefits and risks involved with this procedure.  The benefits include less needle sticks, lab draws from the catheter and patient may be discharged home with the catheter.  Risks include, but not limited to, infection, bleeding, blood clot (thrombus formation), and puncture of an artery; nerve damage and irregular heat beat.  Alternatives to this procedure were also discussed.  PICC/Midline Placement Documentation  PICC / Midline Single Lumen 88/89/16 PICC Right Basilic 42 cm 0 cm (Active)  Indication for Insertion or Continuance of Line Poor Vasculature-patient has had multiple peripheral attempts or PIVs lasting less than 24 hours 06/16/2014  1:00 PM  Exposed Catheter (cm) 0 cm 06/16/2014  1:00 PM  Dressing Change Due 06/23/14 06/16/2014  1:00 PM       Jule Economy Horton 06/16/2014, 1:55 PM

## 2014-06-16 NOTE — Progress Notes (Deleted)
Peripherally Inserted Central Catheter/Midline Placement  The IV Nurse has discussed with the patient and/or persons authorized to consent for the patient, the purpose of this procedure and the potential benefits and risks involved with this procedure.  The benefits include less needle sticks, lab draws from the catheter and patient may be discharged home with the catheter.  Risks include, but not limited to, infection, bleeding, blood clot (thrombus formation), and puncture of an artery; nerve damage and irregular heat beat.  Alternatives to this procedure were also discussed.  PICC/Midline Placement Documentation  PICC Triple Lumen 09/64/38 PICC Right Basilic 44 cm 3 cm (Active)  Indication for Insertion or Continuance of Line Administration of hyperosmolar/irritating solutions (i.e. TPN, Vancomycin, etc.) 06/16/2014  3:00 PM  Exposed Catheter (cm) 3 cm 06/16/2014  3:00 PM  Dressing Change Due 06/23/14 06/16/2014  3:00 PM       Jule Economy Horton 06/16/2014, 3:21 PM

## 2014-06-16 NOTE — Progress Notes (Signed)
[facility] PROGRESS NOTE  [name] Pace MVE:[id number] DOB: Sep 07, 1950 DOA: 06/14/2014 PCP: Salena Saner., MD  Assessment/Plan: . CAP (community acquired pneumonia) -Continue on rocephin and azithromycin. Improving. Possible transition to PO soon. On min O2 support. . Chest pain- atypical, seems to be related to cough. Likely musculoskeletal. Serial enzymes neg x 3 . Hyponatremia - the setting of alcohol abuse, dehydration, and respiratory illness. For now, cont on saline. Pending urine sodium . Sepsis - Cont IV fluids, IV antibiotics. Leukocytosis improving . Alcohol abuse - Elevated etoh levels on admit. Pt is continued on CIWA. Tremors noted during encounter. Pt seems slightly altered.   Marland Kitchen CAD (coronary artery disease) - continue Plavix as tolerated . Tobacco abuse -. Cont on nicotine patch   Code Status: Full Family Communication: Pt in room  Disposition Plan: pending   Consultants:  none  Procedures:  none  Antibiotics:  Rocephin 1/9>>>  azithromycin 1/9>>>  HPI/Subjective: Feels better overall. Still reporting pleuritic chest pain worse with coughing  Objective: Filed Vitals:   06/16/14 0008 06/16/14 0411 06/16/14 0750 06/16/14 1425  BP: 146/65 111/61 128/71 154/51  Pulse: 80 65 63 70  Temp: 98.5 F (36.9 C) 98.3 F (36.8 C) 97.7 F (36.5 C) 98 F (36.7 C)  TempSrc: Oral Oral Oral Oral  Resp: 32 19  18  Height:      Weight:      SpO2: 97% 96% 95% 90%    Intake/Output Summary (Last 24 hours) at 06/16/14 1456 Last data filed at 06/16/14 0900  Gross per 24 hour  Intake   1140 ml  Output      0 ml  Net   1140 ml   Filed Weights   06/14/14 2321  Weight: 67.6 kg (149 lb 0.5 oz)    Exam:   General:  Awake, in nad  Cardiovascular: regular, s1, s2  Respiratory: normal resp effort, no wheezing  Abdomen: soft,nondistended  Musculoskeletal: perfused, no clubbing   Data Reviewed: Basic Metabolic Panel:  Recent Labs Lab  06/14/14 1932 06/15/14 0200 06/15/14 0754 06/16/14 0055  NA 124*  --  124* 128*  K 4.3  --  3.9 3.6  CL 90*  --  98 99  CO2 20  --  20 23  GLUCOSE 85  --  97 143*  BUN 7  --  8 9  CREATININE 0.67 0.64 0.60 0.60  CALCIUM 7.8*  --  6.9* 7.4*  MG  --  1.3* 1.6  --   PHOS  --  3.0 2.7  --    Liver Function Tests:  Recent Labs Lab 06/14/14 1912 06/15/14 0754 06/16/14 0055  AST 31 24 18   ALT 18 15 12   ALKPHOS 99 65 62  BILITOT 0.6 0.9 0.4  PROT 6.9 5.5* 5.2*  ALBUMIN 3.4* 2.7* 2.5*    Recent Labs Lab 06/14/14 1912  LIPASE 18   No results for input(s): AMMONIA in the last 168 hours. CBC:  Recent Labs Lab 06/14/14 1855 06/15/14 0200 06/16/14 0055  WBC 16.9* 14.3* 13.2*  HGB 16.5 14.0 13.6  HCT 45.5 39.3 39.3  MCV 95.4 95.2 96.3  PLT 192 170 154   Cardiac Enzymes:  Recent Labs Lab 06/15/14 0200 06/15/14 0754 06/15/14 1437 06/16/14 0055  TROPONINI <0.03 <0.03 <0.03 <0.03   BNP (last 3 results) No results for input(s): PROBNP in the last 8760 hours. CBG: No results for input(s): GLUCAP in the last 168 hours.  Recent Results (from the past 240 hour(s))  Blood culture (routine x 2)     Status: None (Preliminary result)   Collection Time: 06/14/14  8:13 PM  Result Value Ref Range Status   Specimen Description BLOOD RIGHT HAND  Final   Special Requests BOTTLES DRAWN AEROBIC AND ANAEROBIC 3CC  Final   Culture   Final           BLOOD CULTURE RECEIVED NO GROWTH TO DATE CULTURE WILL BE HELD FOR 5 DAYS BEFORE ISSUING A FINAL NEGATIVE REPORT Performed at [facility]    Report Status PENDING  Incomplete  Blood culture (routine x 2)     Status: None (Preliminary result)   Collection Time: 06/14/14  8:48 PM  Result Value Ref Range Status   Specimen Description BLOOD RIGHT ANTECUBITAL  Final   Special Requests BOTTLES DRAWN AEROBIC AND ANAEROBIC 5CC  Final   Culture   Final           BLOOD CULTURE RECEIVED NO GROWTH TO DATE CULTURE WILL BE HELD FOR 5  DAYS BEFORE ISSUING A FINAL NEGATIVE REPORT Performed at [facility]    Report Status PENDING  Incomplete  Culture, sputum-assessment     Status: None   Collection Time: 06/15/14  9:14 PM  Result Value Ref Range Status   Specimen Description SPUTUM  Final   Special Requests Normal  Final   Sputum evaluation   Final    THIS SPECIMEN IS ACCEPTABLE. RESPIRATORY CULTURE REPORT TO FOLLOW.   Report Status 06/15/2014 FINAL  Final     Studies: Dg Chest 2 View  06/14/2014   CLINICAL DATA:  History of CAD.  Coronary bypass graft.  EXAM: CHEST  2 VIEW  COMPARISON:  Radiograph 12/10/2013  FINDINGS: Sternotomy wires overlie normal cardiac silhouette. There is new fine airspace disease in the left lower lobe. No pleural fluid. No pneumothorax.  IMPRESSION: New left lower lobe pneumonia.   Electronically Signed   By: Suzy Bouchard M.D.   On: 06/14/2014 19:44    Scheduled Meds: . ALPRAZolam  1 mg Oral TID  . azithromycin  500 mg Oral Daily  . cefTRIAXone (ROCEPHIN)  IV  1 g Intravenous QHS  . clopidogrel  75 mg Oral Daily  . enoxaparin (LOVENOX) injection  40 mg Subcutaneous QHS  . feeding supplement (ENSURE COMPLETE)  237 mL Oral BID BM  . folic acid  1 mg Oral Daily  . nicotine  21 mg Transdermal Daily  . pantoprazole  40 mg Oral Daily  . thiamine  100 mg Intravenous Daily  . vancomycin  750 mg Intravenous Q8H   Continuous Infusions:   Active Problems:   Chest pain   CAP (community acquired pneumonia)   Hyponatremia   Sepsis   Alcohol abuse   CAD (coronary artery disease)   Tobacco abuse   Subclavian artery stenosis, left  Time spent: 42min  Rylynne Schicker, [place] [facility] Pager [phone]. If 7PM-7AM, please contact night-coverage at [email or web address], password [facility] 06/16/2014, 2:56 PM  LOS: 2 days

## 2014-06-16 NOTE — Progress Notes (Signed)
Peripherally Inserted Central Catheter/Midline Placement  The IV Nurse has discussed with the patient and/or persons authorized to consent for the patient, the purpose of this procedure and the potential benefits and risks involved with this procedure.  The benefits include less needle sticks, lab draws from the catheter and patient may be discharged home with the catheter.  Risks include, but not limited to, infection, bleeding, blood clot (thrombus formation), and puncture of an artery; nerve damage and irregular heat beat.  Alternatives to this procedure were also discussed.  PICC/Midline Placement Documentation  PICC / Midline Single Lumen 83/29/19 PICC Right Basilic 42 cm 0 cm (Active)  Indication for Insertion or Continuance of Line Poor Vasculature-patient has had multiple peripheral attempts or PIVs lasting less than 24 hours 06/16/2014  1:00 PM  Exposed Catheter (cm) 0 cm 06/16/2014  1:00 PM  Dressing Change Due 06/23/14 06/16/2014  1:00 PM       Jule Economy Horton 06/16/2014, 3:33 PM

## 2014-06-16 NOTE — Progress Notes (Signed)
INITIAL NUTRITION ASSESSMENT  DOCUMENTATION CODES Per approved criteria  -Severe malnutrition in the context of chronic illness  Pt meets criteria for severe MALNUTRITION in the context of chronic illness as evidenced by 10% wt loss in 6 months and moderate to severe fat and muscle wasting.  INTERVENTION: Ensure Complete po BID, each supplement provides 350 kcal and 13 grams of protein  NUTRITION DIAGNOSIS: Inadequate oral intake related to nausea/vomiting/chest pain as evidenced by varied po intake.   Goal: Pt to meet >/= 90% of their estimated nutrition needs   Monitor:  Weight trend, po intake, acceptance of supplements, labs  Reason for Assessment: MST  64 y.o. male  Admitting Dx: <principal problem not specified>  ASSESSMENT: 64 y.o. male presented with 2-3 days ago Patient developed nausea vomiting chest pain worse with palpation and cough, cough productive of yello green sputum and and chills and body aches.  Pt with history of alcohol abuse.  Pt asleep during RD visit. Lunch tray was in room and pt had eaten >75%. Meal completion recorded as 50-75%. Wt history reveals a 10% wt loss in the past 6 months. RD to order nutritional supplements.   Nutrition Focused Physical Exam:  Subcutaneous Fat:  Orbital Region: mild wasting Upper Arm Region: moderate wasting Thoracic and Lumbar Region: moderate wasting  Muscle:  Temple Region: moderate wasting Clavicle Bone Region: moderate wasting Clavicle and Acromion Bone Region: moderate wasting Scapular Bone Region: n/a Dorsal Hand: moderate wasting Patellar Region: moderate wasting Anterior Thigh Region: mild wasting Posterior Calf Region: mild wasting  Edema: none  Height: Ht Readings from Last 1 Encounters:  06/14/14 6\' 4"  (1.93 m)    Weight: Wt Readings from Last 1 Encounters:  06/14/14 149 lb 0.5 oz (67.6 kg)    Ideal Body Weight: 86.8 kg  % Ideal Body Weight: 78%  Wt Readings from Last 10 Encounters:   06/14/14 149 lb 0.5 oz (67.6 kg)  12/10/13 165 lb (74.844 kg)    Usual Body Weight: 165 lbs  % Usual Body Weight: 90%  BMI:  Body mass index is 18.15 kg/(m^2).  Estimated Nutritional Needs: Kcal: 2000-2200 Protein: 115-125 g Fluid: 2.0-2.2 L  Skin: intact  Diet Order: Diet Heart  EDUCATION NEEDS: -Education needs addressed   Intake/Output Summary (Last 24 hours) at 06/16/14 1712 Last data filed at 06/16/14 1454  Gross per 24 hour  Intake   1410 ml  Output      0 ml  Net   1410 ml    Last BM: 1/11   Labs:   Recent Labs Lab 06/14/14 1932 06/15/14 0200 06/15/14 0754 06/16/14 0055  NA 124*  --  124* 128*  K 4.3  --  3.9 3.6  CL 90*  --  98 99  CO2 20  --  20 23  BUN 7  --  8 9  CREATININE 0.67 0.64 0.60 0.60  CALCIUM 7.8*  --  6.9* 7.4*  MG  --  1.3* 1.6  --   PHOS  --  3.0 2.7  --   GLUCOSE 85  --  97 143*    CBG (last 3)  No results for input(s): GLUCAP in the last 72 hours.  Scheduled Meds: . ALPRAZolam  1 mg Oral TID  . azithromycin  500 mg Oral Daily  . cefTRIAXone (ROCEPHIN)  IV  1 g Intravenous QHS  . clopidogrel  75 mg Oral Daily  . enoxaparin (LOVENOX) injection  40 mg Subcutaneous QHS  . feeding supplement (ENSURE  COMPLETE)  237 mL Oral BID BM  . folic acid  1 mg Oral Daily  . nicotine  21 mg Transdermal Daily  . pantoprazole  40 mg Oral Daily  . sodium chloride  10-40 mL Intracatheter Q12H  . thiamine  100 mg Intravenous Daily  . vancomycin  750 mg Intravenous Q8H    Continuous Infusions:   Past Medical History  Diagnosis Date  . High cholesterol   . Coronary artery disease   . Alcohol abuse   . Stroke   . Anxiety   . Prostate cancer     per patient diagnosed in early 27's  . Breast cancer Left breast    Per patient diagnosed in early 53's.     Past Surgical History  Procedure Laterality Date  . Coronary artery bypass graft    . Carotid stent      Laurette Schimke MS, RD, LDN

## 2014-06-17 LAB — BASIC METABOLIC PANEL
Anion gap: 8 (ref 5–15)
BUN: 8 mg/dL (ref 6–23)
CO2: 27 mmol/L (ref 19–32)
Calcium: 8.1 mg/dL — ABNORMAL LOW (ref 8.4–10.5)
Chloride: 100 mEq/L (ref 96–112)
Creatinine, Ser: 0.61 mg/dL (ref 0.50–1.35)
GFR calc Af Amer: >90 mL/min (ref >90)
GFR calc non Af Amer: >90 mL/min (ref >90)
Glucose, Bld: 99 mg/dL (ref 70–99)
Potassium: 4.1 mmol/L (ref 3.5–5.1)
SODIUM: 135 mmol/L (ref 135–145)

## 2014-06-17 LAB — CBC
HCT: 38.8 % — ABNORMAL LOW (ref 39.0–52.0)
HEMOGLOBIN: 13.3 g/dL (ref 13.0–17.0)
MCH: 33.3 pg (ref 26.0–34.0)
MCHC: 34.3 g/dL (ref 30.0–36.0)
MCV: 97.2 fL (ref 78.0–100.0)
Platelets: 166 10*3/uL (ref 150–400)
RBC: 3.99 MIL/uL — AB (ref 4.22–5.81)
RDW: 12.9 % (ref 11.5–15.5)
WBC: 10.6 10*3/uL — ABNORMAL HIGH (ref 4.0–10.5)

## 2014-06-17 LAB — LEGIONELLA ANTIGEN, URINE

## 2014-06-17 MED ORDER — LEVOFLOXACIN 750 MG PO TABS
750.0000 mg | ORAL_TABLET | Freq: Every day | ORAL | Status: DC
  Administered 2014-06-17 – 2014-06-18 (×2): 750 mg via ORAL
  Filled 2014-06-17 (×4): qty 1

## 2014-06-17 NOTE — Progress Notes (Addendum)
[facility] PROGRESS NOTE  [name] Pace ZRA:[id number] DOB: 1950-09-19 DOA: 06/14/2014 PCP: [name] Saner., MD  Assessment/Plan: . CAP (community acquired pneumonia) -Initially started on rocephin and azithromycin with vanc. Clinically improving. Will transition to PO levaquiin today. On min O2 support. . Chest pain- atypical, seems to be related to cough. Likely musculoskeletal. Serial enzymes neg x 3. Good improvement with toradol . Hyponatremia - the setting of alcohol abuse, dehydration, and respiratory illness. Resolved with hydration . Sepsis - resolved with IVF and abx. . Alcohol abuse - Elevated etoh levels on admit. Pt is continued on CIWA. Tremors noted during encounter, since resolved. Mental status seems at baseline now . CAD (coronary artery disease) - continue Plavix as tolerated. Serial troponin neg x 3 . Tobacco abuse -. Cont on nicotine patch   Code Status: Full Family Communication: Pt in room  Disposition Plan: Possible d/c 1/12 if stable on PO abx  Consultants:  none  Procedures:  none  Antibiotics:  Rocephin 1/9>>>1/11  azithromycin 1/9>>>1/11  Vancomycin 1/9>>>1/11  Levaquin 1/11>>>  HPI/Subjective: Reports chest pain much improved with toradol. No acute events overnight  Objective: Filed Vitals:   06/16/14 1816 06/16/14 2049 06/17/14 0516 06/17/14 1346  BP: 154/52 150/66 143/56 127/60  Pulse: 67 113 70 50  Temp: 98.2 F (36.8 C) 98.2 F (36.8 C) 98.3 F (36.8 C) 98 F (36.7 C)  TempSrc: Oral Oral Oral Oral  Resp: 18 20 18 18   Height:      Weight:      SpO2: 93% 97% 99% 97%    Intake/Output Summary (Last 24 hours) at 06/17/14 1656 Last data filed at 06/17/14 1207  Gross per 24 hour  Intake   1047 ml  Output    900 ml  Net    147 ml   Filed Weights   06/14/14 2321  Weight: 67.6 kg (149 lb 0.5 oz)    Exam:   General:  Awake, in nad  Cardiovascular: regular, s1, s2  Respiratory: normal resp effort, no  wheezing  Abdomen: soft,nondistended  Musculoskeletal: perfused, no clubbing   Data Reviewed: Basic Metabolic Panel:  Recent Labs Lab 06/14/14 1932 06/15/14 0200 06/15/14 0754 06/16/14 0055 06/17/14 0345  NA 124*  --  124* 128* 135  K 4.3  --  3.9 3.6 4.1  CL 90*  --  98 99 100  CO2 20  --  20 23 27   GLUCOSE 85  --  97 143* 99  BUN 7  --  8 9 8   CREATININE 0.67 0.64 0.60 0.60 0.61  CALCIUM 7.8*  --  6.9* 7.4* 8.1*  MG  --  1.3* 1.6  --   --   PHOS  --  3.0 2.7  --   --    Liver Function Tests:  Recent Labs Lab 06/14/14 1912 06/15/14 0754 06/16/14 0055  AST 31 24 18   ALT 18 15 12   ALKPHOS 99 65 62  BILITOT 0.6 0.9 0.4  PROT 6.9 5.5* 5.2*  ALBUMIN 3.4* 2.7* 2.5*    Recent Labs Lab 06/14/14 1912  LIPASE 18   No results for input(s): AMMONIA in the last 168 hours. CBC:  Recent Labs Lab 06/14/14 1855 06/15/14 0200 06/16/14 0055 06/17/14 0345  WBC 16.9* 14.3* 13.2* 10.6*  HGB 16.5 14.0 13.6 13.3  HCT 45.5 39.3 39.3 38.8*  MCV 95.4 95.2 96.3 97.2  PLT 192 170 154 166   Cardiac Enzymes:  Recent Labs Lab 06/15/14 0200 06/15/14 0754 06/15/14 1437 06/16/14  0055  TROPONINI <0.03 <0.03 <0.03 <0.03   BNP (last 3 results) No results for input(s): PROBNP in the last 8760 hours. CBG: No results for input(s): GLUCAP in the last 168 hours.  Recent Results (from the past 240 hour(s))  Blood culture (routine x 2)     Status: None (Preliminary result)   Collection Time: 06/14/14  8:13 PM  Result Value Ref Range Status   Specimen Description BLOOD RIGHT HAND  Final   Special Requests BOTTLES DRAWN AEROBIC AND ANAEROBIC 3CC  Final   Culture   Final           BLOOD CULTURE RECEIVED NO GROWTH TO DATE CULTURE WILL BE HELD FOR 5 DAYS BEFORE ISSUING A FINAL NEGATIVE REPORT Performed at [facility]    Report Status PENDING  Incomplete  Blood culture (routine x 2)     Status: None (Preliminary result)   Collection Time: 06/14/14  8:48 PM  Result  Value Ref Range Status   Specimen Description BLOOD RIGHT ANTECUBITAL  Final   Special Requests BOTTLES DRAWN AEROBIC AND ANAEROBIC 5CC  Final   Culture   Final           BLOOD CULTURE RECEIVED NO GROWTH TO DATE CULTURE WILL BE HELD FOR 5 DAYS BEFORE ISSUING A FINAL NEGATIVE REPORT Performed at [facility]    Report Status PENDING  Incomplete  Culture, sputum-assessment     Status: None   Collection Time: 06/15/14  9:14 PM  Result Value Ref Range Status   Specimen Description SPUTUM  Final   Special Requests Normal  Final   Sputum evaluation   Final    THIS SPECIMEN IS ACCEPTABLE. RESPIRATORY CULTURE REPORT TO FOLLOW.   Report Status 06/15/2014 FINAL  Final  Culture, respiratory (NON-Expectorated)     Status: None (Preliminary result)   Collection Time: 06/15/14  9:14 PM  Result Value Ref Range Status   Specimen Description SPUTUM  Final   Special Requests NONE  Final   Gram Stain   Final    ABUNDANT WBC PRESENT, PREDOMINANTLY PMN RARE SQUAMOUS EPITHELIAL CELLS PRESENT NO ORGANISMS SEEN Performed at [facility]    Culture   Final    NORMAL OROPHARYNGEAL FLORA Performed at [facility]    Report Status PENDING  Incomplete     Studies: No results found.  Scheduled Meds: . ALPRAZolam  1 mg Oral TID  . clopidogrel  75 mg Oral Daily  . enoxaparin (LOVENOX) injection  40 mg Subcutaneous QHS  . feeding supplement (ENSURE COMPLETE)  237 mL Oral BID BM  . folic acid  1 mg Oral Daily  . levofloxacin  750 mg Oral Q2000  . nicotine  21 mg Transdermal Daily  . pantoprazole  40 mg Oral Daily  . sodium chloride  10-40 mL Intracatheter Q12H  . thiamine  100 mg Intravenous Daily   Continuous Infusions:   Active Problems:   Chest pain   CAP (community acquired pneumonia)   Hyponatremia   Sepsis   Alcohol abuse   CAD (coronary artery disease)   Tobacco abuse   Subclavian artery stenosis, left  Time spent: 61min  [name] Pace, [name] Pace  Hospitalists Pager [phone]. If 7PM-7AM, please contact night-coverage at [email or web address], password [facility] 06/17/2014, 4:56 PM  LOS: 3 days

## 2014-06-18 LAB — CBC WITH DIFFERENTIAL/PLATELET
BASOS PCT: 0 % (ref 0–1)
Basophils Absolute: 0 10*3/uL (ref 0.0–0.1)
Eosinophils Absolute: 0.1 10*3/uL (ref 0.0–0.7)
Eosinophils Relative: 1 % (ref 0–5)
HEMATOCRIT: 38.8 % — AB (ref 39.0–52.0)
HEMOGLOBIN: 13.6 g/dL (ref 13.0–17.0)
LYMPHS ABS: 1.2 10*3/uL (ref 0.7–4.0)
Lymphocytes Relative: 16 % (ref 12–46)
MCH: 34 pg (ref 26.0–34.0)
MCHC: 35.1 g/dL (ref 30.0–36.0)
MCV: 97 fL (ref 78.0–100.0)
MONOS PCT: 13 % — AB (ref 3–12)
Monocytes Absolute: 1 10*3/uL (ref 0.1–1.0)
NEUTROS PCT: 70 % (ref 43–77)
Neutro Abs: 5.3 10*3/uL (ref 1.7–7.7)
Platelets: 207 10*3/uL (ref 150–400)
RBC: 4 MIL/uL — AB (ref 4.22–5.81)
RDW: 12.7 % (ref 11.5–15.5)
WBC: 7.6 10*3/uL (ref 4.0–10.5)

## 2014-06-18 LAB — MAGNESIUM: MAGNESIUM: 1.8 mg/dL (ref 1.5–2.5)

## 2014-06-18 LAB — BASIC METABOLIC PANEL
Anion gap: 7 (ref 5–15)
BUN: 12 mg/dL (ref 6–23)
CALCIUM: 8.4 mg/dL (ref 8.4–10.5)
CO2: 28 mmol/L (ref 19–32)
Chloride: 98 mEq/L (ref 96–112)
Creatinine, Ser: 0.74 mg/dL (ref 0.50–1.35)
GFR calc Af Amer: >90 mL/min (ref >90)
GLUCOSE: 97 mg/dL (ref 70–99)
Potassium: 4.3 mmol/L (ref 3.5–5.1)
Sodium: 133 mmol/L — ABNORMAL LOW (ref 135–145)

## 2014-06-18 LAB — CULTURE, RESPIRATORY W GRAM STAIN: Culture: NORMAL

## 2014-06-18 LAB — PROCALCITONIN: PROCALCITONIN: 0.97 ng/mL

## 2014-06-18 LAB — CULTURE, RESPIRATORY

## 2014-06-18 MED ORDER — FOLIC ACID 1 MG PO TABS: 1.0000 mg | ORAL_TABLET | Freq: Every day | ORAL | Status: DC

## 2014-06-18 MED ORDER — LEVOFLOXACIN 750 MG PO TABS: 750.0000 mg | ORAL_TABLET | Freq: Every day | ORAL | Status: DC

## 2014-06-18 MED ORDER — NICOTINE 21 MG/24HR TD PT24: 21.0000 mg | MEDICATED_PATCH | Freq: Every day | TRANSDERMAL | Status: DC

## 2014-06-18 MED ORDER — ENSURE COMPLETE PO LIQD: 237.0000 mL | Freq: Two times a day (BID) | ORAL | Status: DC

## 2014-06-18 MED ORDER — DM-GUAIFENESIN ER 30-600 MG PO TB12
2.0000 | ORAL_TABLET | Freq: Two times a day (BID) | ORAL | Status: DC
  Administered 2014-06-18: 2 via ORAL
  Filled 2014-06-18: qty 2

## 2014-06-18 MED ORDER — VITAMIN B-1 100 MG PO TABS: 100.0000 mg | ORAL_TABLET | Freq: Every day | ORAL | Status: DC

## 2014-06-18 MED ORDER — ALBUTEROL SULFATE HFA 108 (90 BASE) MCG/ACT IN AERS: 2.0000 | INHALATION_SPRAY | Freq: Four times a day (QID) | RESPIRATORY_TRACT | Status: DC | PRN

## 2014-06-18 MED ORDER — DM-GUAIFENESIN ER 30-600 MG PO TB12: 2.0000 | ORAL_TABLET | Freq: Two times a day (BID) | ORAL | Status: DC

## 2014-06-18 NOTE — Progress Notes (Signed)
Spoke with pt concerning [name] Pace needs.  Pt states, " I have [name] Pace already with my insurance. I will call you back when I get home with the name of co. Will you give me your card." Information card given to pt with CM's name and number.

## 2014-06-18 NOTE — Discharge Summary (Signed)
Physician Discharge Summary  [name] Pace EGB:[id number] DOB: 19-Jun-1950 DOA: 06/14/2014  PCP: Salena Saner., MD  Admit date: 06/14/2014 Discharge date: 06/18/2014  Time spent: 65 minutes  Recommendations for Outpatient Follow-up:  1. Follow-up with Salena Saner., MD in 1 week. On follow-up patient will need a basic metabolic profile done to follow-up on electrolytes and renal function.  Discharge Diagnoses:  Principal Problem:   Sepsis Active Problems:   CAP (community acquired pneumonia)   Chest pain   Hyponatremia   Alcohol abuse   CAD (coronary artery disease)   Tobacco abuse   Subclavian artery stenosis, left   Discharge Condition: Stable and improved  Diet recommendation: Heart healthy  Filed Weights   06/14/14 2321  Weight: 67.6 kg (149 lb 0.5 oz)    History of present illness:  [name] Pace is a 64 y.o. male   has a past medical history of High cholesterol; Coronary artery disease; and Alcohol abuse.   Presented with  2-3 days history of nausea, vomiting, chest pain worse with palpation and cough, cough productive of yello green sputum and and chills and body aches. His roommate had something similar. He called EMS was brought to the emergency department. He was found to have infiltrate on chest x-ray. Sodium was noted to be down to 124. leukocytosis  up to 16.9. In ER patient was febrile up to 102.3 with a respiratory rate up to 22 with elevated lactic acid of 3. Meeting sepsis criteria.   Of note patient has history of alcohol abuse. States he drinks about 6 pack a day, sometimes does not drink denies significant hx of withdrawal, also Reports have not had his xanax for few days that he takes on regular basis.  Hospitalist was called for admission for CAP and sepsis  Hospital Course:  #1 sepsis secondary to community acquired pneumonia Patient was admitted with sepsis meeting the criteria secondary to fever with a temperature 102.3, respiratory  rate of 22 white count of 16.9 with chest x-ray consistent with a basilar pneumonia. Patient was admitted placed empirically on IV vancomycin, Rocephin, azithromycin. Patient was pancultured with blood cultures with no growth to date. Patient improved clinically and was transitioned to oral Levaquin be discharged on 4 more days of oral Levaquin to complete a course of antibiotic therapy. Patient will follow-up with PCP as an outpatient.  #2 community  acquired pneumonia Patient  on admission was noted to have a  basilar pneumonia per chest x-ray noted to have a fever productive cough and leukocytosis with chills. Patient was admitted placed empirically on IV vancomycin, IV Rocephin, IV azithromycin. Patient was also placed on nebs as needed and antitussives agents. Patient improved clinically white count trended down and leukocytosis had resolved by day of discharge. Patient remained afebrile patient was subsequently transitioned to oral Levaquin which she tolerated the patient be discharged on 4 more days of oral Levaquin to complete an 8 day course of antibiotic therapy. Patient will follow-up with PCP as outpatient.  #3 hyponatremia Patient was noted to be hyponatremic on admission felt to be secondary to dehydration and history of alcohol abuse as well as acute respiratory issues. Patient was hydrated with IV fluids with resolution of his hyponatremia.  #4: Alcohol Abuse Patient noted to have elevated levels of alcohol on admission. Patient was placed on Ativan withdrawal CIWA protocol. Patient had some tremors during the hospitalization which resolved. Patient's mental status was at baseline. Patient will be discharged home in stable condition.   #  5 tobacco abuse Tobacco cessation. Patient was placed on a nicotine patch.  The rest of patient's chronic medical issues remained stable throughout the hospitalization and patient be discharged in stable and improved condition.  Procedures:  CXR  06/14/13  Consultations:  None  Discharge Exam: Filed Vitals:   06/18/14 0700  BP: 146/82  Pulse:   Temp:   Resp:     General: NAD Cardiovascular: RRR Respiratory: CTAB  Discharge Instructions   Discharge Instructions    Diet - low sodium heart healthy    Complete by:  As directed      Discharge instructions    Complete by:  As directed   Follow up with Salena Saner., MD in 1 week.     Increase activity slowly    Complete by:  As directed           Current Discharge Medication List    START taking these medications   Details  albuterol (PROVENTIL HFA;VENTOLIN HFA) 108 (90 BASE) MCG/ACT inhaler Inhale 2 puffs into the lungs every 6 (six) hours as needed for wheezing or shortness of breath. 2 puffs in lungs 3 times daily x 3 days then as needed. Qty: 1 Inhaler, Refills: 0    dextromethorphan-guaiFENesin (MUCINEX DM) 30-600 MG per 12 hr tablet Take 2 tablets by mouth 2 (two) times daily. Take for 4 days then stop. Qty: 20 tablet, Refills: 0    feeding supplement, ENSURE COMPLETE, (ENSURE COMPLETE) LIQD Take 237 mLs by mouth 2 (two) times daily between meals.    folic acid (FOLVITE) 1 MG tablet Take 1 tablet (1 mg total) by mouth daily.    levofloxacin (LEVAQUIN) 750 MG tablet Take 1 tablet (750 mg total) by mouth daily at 8 pm. Take for 4 days then stop. Qty: 4 tablet, Refills: 0    nicotine (NICODERM CQ - DOSED IN MG/24 HOURS) 21 mg/24hr patch Place 1 patch (21 mg total) onto the skin daily. Qty: 28 patch, Refills: 0    thiamine (VITAMIN B-1) 100 MG tablet Take 1 tablet (100 mg total) by mouth daily.      CONTINUE these medications which have NOT CHANGED   Details  ALPRAZolam (XANAX) 1 MG tablet Take 1 mg by mouth 3 (three) times daily.     clopidogrel (PLAVIX) 75 MG tablet Take 75 mg by mouth daily.    omeprazole (PRILOSEC) 40 MG capsule Take 1 capsule (40 mg total) by mouth daily. Qty: 30 capsule, Refills: 0    ondansetron (ZOFRAN ODT) 4 MG  disintegrating tablet 4mg  ODT q4 hours prn nausea/vomit Qty: 8 tablet, Refills: 0       Allergies  Allergen Reactions  . Penicillins Swelling   Follow-up Information    Follow up with Salena Saner., MD. Schedule an appointment as soon as possible for a visit in 1 week.   Specialty:  Internal Medicine   Contact information:   [address] [place] [address] [phone]        The results of significant diagnostics from this hospitalization (including imaging, microbiology, ancillary and laboratory) are listed below for reference.    Significant Diagnostic Studies: Dg Chest 2 View  06/14/2014   CLINICAL DATA:  History of CAD.  Coronary bypass graft.  EXAM: CHEST  2 VIEW  COMPARISON:  Radiograph 12/10/2013  FINDINGS: Sternotomy wires overlie normal cardiac silhouette. There is new fine airspace disease in the left lower lobe. No pleural fluid. No pneumothorax.  IMPRESSION: New left lower lobe pneumonia.  Electronically Signed   By: Suzy Bouchard M.D.   On: 06/14/2014 19:44    Microbiology: Recent Results (from the past 240 hour(s))  Blood culture (routine x 2)     Status: None (Preliminary result)   Collection Time: 06/14/14  8:13 PM  Result Value Ref Range Status   Specimen Description BLOOD RIGHT HAND  Final   Special Requests BOTTLES DRAWN AEROBIC AND ANAEROBIC 3CC  Final   Culture   Final           BLOOD CULTURE RECEIVED NO GROWTH TO DATE CULTURE WILL BE HELD FOR 5 DAYS BEFORE ISSUING A FINAL NEGATIVE REPORT Performed at [facility]    Report Status PENDING  Incomplete  Blood culture (routine x 2)     Status: None (Preliminary result)   Collection Time: 06/14/14  8:48 PM  Result Value Ref Range Status   Specimen Description BLOOD RIGHT ANTECUBITAL  Final   Special Requests BOTTLES DRAWN AEROBIC AND ANAEROBIC 5CC  Final   Culture   Final           BLOOD CULTURE RECEIVED NO GROWTH TO DATE CULTURE WILL BE HELD FOR 5 DAYS BEFORE ISSUING  A FINAL NEGATIVE REPORT Performed at [facility]    Report Status PENDING  Incomplete  Culture, sputum-assessment     Status: None   Collection Time: 06/15/14  9:14 PM  Result Value Ref Range Status   Specimen Description SPUTUM  Final   Special Requests Normal  Final   Sputum evaluation   Final    THIS SPECIMEN IS ACCEPTABLE. RESPIRATORY CULTURE REPORT TO FOLLOW.   Report Status 06/15/2014 FINAL  Final  Culture, respiratory (NON-Expectorated)     Status: None   Collection Time: 06/15/14  9:14 PM  Result Value Ref Range Status   Specimen Description SPUTUM  Final   Special Requests NONE  Final   Gram Stain   Final    ABUNDANT WBC PRESENT, PREDOMINANTLY PMN RARE SQUAMOUS EPITHELIAL CELLS PRESENT NO ORGANISMS SEEN Performed at [facility]    Culture   Final    NORMAL OROPHARYNGEAL FLORA Performed at [facility]    Report Status 06/18/2014 FINAL  Final     Labs: Basic Metabolic Panel:  Recent Labs Lab 06/14/14 1932 06/15/14 0200 06/15/14 0754 06/16/14 0055 06/17/14 0345 06/18/14 0430  NA 124*  --  124* 128* 135 133*  K 4.3  --  3.9 3.6 4.1 4.3  CL 90*  --  98 99 100 98  CO2 20  --  20 23 27 28   GLUCOSE 85  --  97 143* 99 97  BUN 7  --  8 9 8 12   CREATININE 0.67 0.64 0.60 0.60 0.61 0.74  CALCIUM 7.8*  --  6.9* 7.4* 8.1* 8.4  MG  --  1.3* 1.6  --   --  1.8  PHOS  --  3.0 2.7  --   --   --    Liver Function Tests:  Recent Labs Lab 06/14/14 1912 06/15/14 0754 06/16/14 0055  AST 31 24 18   ALT 18 15 12   ALKPHOS 99 65 62  BILITOT 0.6 0.9 0.4  PROT 6.9 5.5* 5.2*  ALBUMIN 3.4* 2.7* 2.5*    Recent Labs Lab 06/14/14 1912  LIPASE 18   No results for input(s): AMMONIA in the last 168 hours. CBC:  Recent Labs Lab 06/14/14 1855 06/15/14 0200 06/16/14 0055 06/17/14 0345 06/18/14 0430  WBC 16.9* 14.3* 13.2* 10.6*  7.6  NEUTROABS  --   --   --   --  5.3  HGB 16.5 14.0 13.6 13.3 13.6  HCT 45.5 39.3 39.3 38.8* 38.8*  MCV 95.4  95.2 96.3 97.2 97.0  PLT 192 170 154 166 207   Cardiac Enzymes:  Recent Labs Lab 06/15/14 0200 06/15/14 0754 06/15/14 1437 06/16/14 0055  TROPONINI <0.03 <0.03 <0.03 <0.03   BNP: BNP (last 3 results) No results for input(s): PROBNP in the last 8760 hours. CBG: No results for input(s): GLUCAP in the last 168 hours.     SignedIrine Seal MD [facility] 06/18/2014, 2:06 PM

## 2014-06-18 NOTE — Evaluation (Signed)
Physical Therapy Evaluation Patient Details Name: [name] Pace MRN: [id number] DOB: 1950/06/10 Today's Date: 06/18/2014   History of Present Illness  Pt is a 64 year old male admitted for CAP, with hx of stroke, CAD, alcohol abuse  Clinical Impression  Pt admitted with above diagnosis. Pt currently with functional limitations due to the deficits listed below (see PT Problem List).  Pt will benefit from skilled PT to increase their independence and safety with mobility to allow discharge to the venue listed below.  Pt requiring occasional safety cue and presents with mild unsteady gait however no LOB.  Pt agreeable to use his SPC upon d/c.  Pt denies any SOB during ambulation and states he would like to d/c home today.      Follow Up Recommendations Home health PT;Supervision for mobility/OOB    Equipment Recommendations  None recommended by PT    Recommendations for Other Services       Precautions / Restrictions Precautions Precautions: Fall      Mobility  Bed Mobility Overal bed mobility: Needs Assistance Bed Mobility: Supine to Sit     Supine to sit: Supervision     General bed mobility comments: assist for LEs tanlged in linen  Transfers Overall transfer level: Needs assistance Equipment used: 1 person hand held assist Transfers: Sit to/from Stand Sit to Stand: Min guard            Ambulation/Gait Ambulation/Gait assistance: Min guard Ambulation Distance (Feet): 200 Feet Assistive device: 1 person hand held assist Gait Pattern/deviations: Step-through pattern;Decreased stride length;Narrow base of support     General Gait Details: pt initially started with IV pole and UE assist however able to progress to just IV pole for support, stiff LEs with gait, slight unsteadiness observed however no LOB  Stairs            Wheelchair Mobility    Modified Rankin (Stroke Patients Only)       Balance                                             Pertinent Vitals/Pain Pain Assessment: No/denies pain  SpO2 97% room air after ambulation    Home Living Family/patient expects to be discharged to:: Private residence Living Arrangements: Other (Comment);Spouse/significant other Available Help at Discharge: Family   Home Access: Level entry     Home Layout: One level Home Equipment: Cane - single point Additional Comments: pt reports he lives with his significant other, her child, and a roommate    Prior Function Level of Independence: Independent               Hand Dominance        Extremity/Trunk Assessment               Lower Extremity Assessment: Generalized weakness         Communication   Communication: No difficulties  Cognition Arousal/Alertness: Awake/alert Behavior During Therapy: WFL for tasks assessed/performed Overall Cognitive Status: Within Functional Limits for tasks assessed                      General Comments      Exercises        Assessment/Plan    PT Assessment Patient needs continued PT services  PT Diagnosis Difficulty walking   PT Problem List Decreased strength;Decreased activity tolerance;Decreased balance;Decreased  mobility;Decreased knowledge of use of DME  PT Treatment Interventions DME instruction;Gait training;Functional mobility training;Therapeutic activities;Patient/family education;Therapeutic exercise;Balance training   PT Goals (Current goals can be found in the Care Plan section) Acute Rehab PT Goals PT Goal Formulation: With patient Time For Goal Achievement: 06/25/14 Potential to Achieve Goals: Good    Frequency Min 3X/week   Barriers to discharge        Co-evaluation               End of Session Equipment Utilized During Treatment: Gait belt Activity Tolerance: Patient tolerated treatment well Patient left: in bed;with call bell/phone within reach;with bed alarm set           Time: 1319-1330 PT Time Calculation  (min) (ACUTE ONLY): 11 min   Charges:   PT Evaluation $Initial PT Evaluation Tier I: 1 Procedure PT Treatments $Gait Training: 8-22 mins   PT G Codes:        Kasey Hansell,KATHrine E 06/18/2014, 1:35 PM Carmelia Bake, PT, DPT 06/18/2014 Pager: [phone]

## 2014-06-18 NOTE — Progress Notes (Signed)
[facility] for [facility] needs.

## 2014-06-18 NOTE — Progress Notes (Signed)
Pt's d/c packet completed, all questions answered, prescriptions provided. Pt was dressed and pt's ride had arrived. After paperwork was completed, pt walked from bed to hallway, and stood in hallway. NT made aware pt needed a wheelchair, and was ready to leave. At this point pt decided to do a "dance" and twirl in hallway. This was witnessed by NT. Pt then fell forward in hallway, hitting his trunk, pt did not hit head. This was witnessed by NT, [employer]. Pt was immediately helped to his feet, no injuries, VSS. Dr. Grandville Silos made aware. MD wanted to be sure HH was set up for pt at home. CM was again notified, as pt provided new information stating that he had not had HH previously (as he had stated prior). HH was set up with [facility] by CM. Pt was updated, and left with roommate via wheelchair.

## 2014-06-21 LAB — CULTURE, BLOOD (ROUTINE X 2)
CULTURE: NO GROWTH
CULTURE: NO GROWTH

## 2014-06-30 LAB — HIV ANTIBODY (ROUTINE TESTING W REFLEX): HIV-1/HIV-2 Ab: NONREACTIVE

## 2014-07-02 ENCOUNTER — Emergency Department (HOSPITAL_COMMUNITY): Payer: Medicare Other

## 2014-07-02 ENCOUNTER — Inpatient Hospital Stay (HOSPITAL_COMMUNITY): Payer: Medicare Other

## 2014-07-02 ENCOUNTER — Inpatient Hospital Stay (HOSPITAL_COMMUNITY): Payer: Medicare Other | Admitting: Anesthesiology

## 2014-07-02 ENCOUNTER — Encounter (HOSPITAL_COMMUNITY): Payer: Self-pay | Admitting: Emergency Medicine

## 2014-07-02 ENCOUNTER — Inpatient Hospital Stay (HOSPITAL_COMMUNITY)
Admission: EM | Admit: 2014-07-02 | Discharge: 2014-07-04 | DRG: 482 | Disposition: A | Discharge status: Home Health | Payer: Medicare Other | Attending: Internal Medicine | Admitting: Internal Medicine

## 2014-07-02 ENCOUNTER — Encounter (HOSPITAL_COMMUNITY)
Admission: EM | Disposition: A | Discharge status: Home Health | Payer: Self-pay | Source: Home / Self Care | Attending: Internal Medicine

## 2014-07-02 DIAGNOSIS — K219 Gastro-esophageal reflux disease without esophagitis: Secondary | ICD-10-CM | POA: Diagnosis present

## 2014-07-02 DIAGNOSIS — W010XXA Fall on same level from slipping, tripping and stumbling without subsequent striking against object, initial encounter: Secondary | ICD-10-CM | POA: Diagnosis present

## 2014-07-02 DIAGNOSIS — I2510 Atherosclerotic heart disease of native coronary artery without angina pectoris: Secondary | ICD-10-CM | POA: Diagnosis present

## 2014-07-02 DIAGNOSIS — F17210 Nicotine dependence, cigarettes, uncomplicated: Secondary | ICD-10-CM | POA: Diagnosis present

## 2014-07-02 DIAGNOSIS — E780 Pure hypercholesterolemia: Secondary | ICD-10-CM | POA: Diagnosis present

## 2014-07-02 DIAGNOSIS — Z853 Personal history of malignant neoplasm of breast: Secondary | ICD-10-CM

## 2014-07-02 DIAGNOSIS — Y92000 Kitchen of unspecified non-institutional (private) residence as  the place of occurrence of the external cause: Secondary | ICD-10-CM

## 2014-07-02 DIAGNOSIS — Z720 Tobacco use: Secondary | ICD-10-CM

## 2014-07-02 DIAGNOSIS — E785 Hyperlipidemia, unspecified: Secondary | ICD-10-CM | POA: Diagnosis present

## 2014-07-02 DIAGNOSIS — Z8673 Personal history of transient ischemic attack (TIA), and cerebral infarction without residual deficits: Secondary | ICD-10-CM

## 2014-07-02 DIAGNOSIS — S72142A Displaced intertrochanteric fracture of left femur, initial encounter for closed fracture: Principal | ICD-10-CM | POA: Diagnosis present

## 2014-07-02 DIAGNOSIS — Z79899 Other long term (current) drug therapy: Secondary | ICD-10-CM | POA: Diagnosis not present

## 2014-07-02 DIAGNOSIS — Z8546 Personal history of malignant neoplasm of prostate: Secondary | ICD-10-CM

## 2014-07-02 DIAGNOSIS — S72002A Fracture of unspecified part of neck of left femur, initial encounter for closed fracture: Secondary | ICD-10-CM | POA: Diagnosis present

## 2014-07-02 DIAGNOSIS — Z01818 Encounter for other preprocedural examination: Secondary | ICD-10-CM | POA: Insufficient documentation

## 2014-07-02 DIAGNOSIS — M25552 Pain in left hip: Secondary | ICD-10-CM | POA: Diagnosis present

## 2014-07-02 DIAGNOSIS — I639 Cerebral infarction, unspecified: Secondary | ICD-10-CM

## 2014-07-02 DIAGNOSIS — F1010 Alcohol abuse, uncomplicated: Secondary | ICD-10-CM | POA: Diagnosis present

## 2014-07-02 DIAGNOSIS — Z951 Presence of aortocoronary bypass graft: Secondary | ICD-10-CM

## 2014-07-02 DIAGNOSIS — I959 Hypotension, unspecified: Secondary | ICD-10-CM | POA: Diagnosis present

## 2014-07-02 DIAGNOSIS — S72002D Fracture of unspecified part of neck of left femur, subsequent encounter for closed fracture with routine healing: Secondary | ICD-10-CM

## 2014-07-02 DIAGNOSIS — Z09 Encounter for follow-up examination after completed treatment for conditions other than malignant neoplasm: Secondary | ICD-10-CM

## 2014-07-02 DIAGNOSIS — S72009A Fracture of unspecified part of neck of unspecified femur, initial encounter for closed fracture: Secondary | ICD-10-CM | POA: Diagnosis present

## 2014-07-02 DIAGNOSIS — Z9889 Other specified postprocedural states: Secondary | ICD-10-CM

## 2014-07-02 DIAGNOSIS — M25559 Pain in unspecified hip: Secondary | ICD-10-CM

## 2014-07-02 DIAGNOSIS — S72002S Fracture of unspecified part of neck of left femur, sequela: Secondary | ICD-10-CM

## 2014-07-02 DIAGNOSIS — Z8781 Personal history of (healed) traumatic fracture: Secondary | ICD-10-CM

## 2014-07-02 HISTORY — PX: FEMUR IM NAIL: SHX1597

## 2014-07-02 LAB — CBC WITH DIFFERENTIAL/PLATELET
Basophils Absolute: 0.1 10*3/uL (ref 0.0–0.1)
Basophils Relative: 1 % (ref 0–1)
Eosinophils Absolute: 0.2 10*3/uL (ref 0.0–0.7)
Eosinophils Relative: 2 % (ref 0–5)
HEMATOCRIT: 36.8 % — AB (ref 39.0–52.0)
HEMOGLOBIN: 12.9 g/dL — AB (ref 13.0–17.0)
LYMPHS PCT: 14 % (ref 12–46)
Lymphs Abs: 1.6 10*3/uL (ref 0.7–4.0)
MCH: 33.3 pg (ref 26.0–34.0)
MCHC: 35.1 g/dL (ref 30.0–36.0)
MCV: 95.1 fL (ref 78.0–100.0)
MONO ABS: 0.9 10*3/uL (ref 0.1–1.0)
Monocytes Relative: 8 % (ref 3–12)
Neutro Abs: 8.2 10*3/uL — ABNORMAL HIGH (ref 1.7–7.7)
Neutrophils Relative %: 75 % (ref 43–77)
Platelets: 360 10*3/uL (ref 150–400)
RBC: 3.87 MIL/uL — AB (ref 4.22–5.81)
RDW: 12.7 % (ref 11.5–15.5)
WBC: 10.8 10*3/uL — ABNORMAL HIGH (ref 4.0–10.5)

## 2014-07-02 LAB — COMPREHENSIVE METABOLIC PANEL
ALT: 16 U/L (ref 0–53)
AST: 17 U/L (ref 0–37)
Albumin: 3.1 g/dL — ABNORMAL LOW (ref 3.5–5.2)
Alkaline Phosphatase: 127 U/L — ABNORMAL HIGH (ref 39–117)
Anion gap: 10 (ref 5–15)
BILIRUBIN TOTAL: 0.5 mg/dL (ref 0.3–1.2)
BUN: 7 mg/dL (ref 6–23)
CO2: 19 mmol/L (ref 19–32)
CREATININE: 0.58 mg/dL (ref 0.50–1.35)
Calcium: 8.1 mg/dL — ABNORMAL LOW (ref 8.4–10.5)
Chloride: 102 mmol/L (ref 96–112)
GFR calc Af Amer: >90 mL/min (ref >90)
Glucose, Bld: 96 mg/dL (ref 70–99)
Potassium: 3.8 mmol/L (ref 3.5–5.1)
Sodium: 131 mmol/L — ABNORMAL LOW (ref 135–145)
TOTAL PROTEIN: 6 g/dL (ref 6.0–8.3)

## 2014-07-02 LAB — CBG MONITORING, ED: Glucose-Capillary: 73 mg/dL (ref 70–99)

## 2014-07-02 LAB — PROTIME-INR
INR: 0.96 (ref 0.00–1.49)
PROTHROMBIN TIME: 12.9 s (ref 11.6–15.2)

## 2014-07-02 LAB — ETHANOL: Alcohol, Ethyl (B): 17 mg/dL — ABNORMAL HIGH (ref 0–9)

## 2014-07-02 LAB — APTT: aPTT: 28 seconds (ref 24–37)

## 2014-07-02 LAB — GLUCOSE, CAPILLARY: GLUCOSE-CAPILLARY: 66 mg/dL — AB (ref 70–99)

## 2014-07-02 LAB — TYPE AND SCREEN
ABO/RH(D): A POS
Antibody Screen: NEGATIVE

## 2014-07-02 LAB — MRSA PCR SCREENING: MRSA by PCR: NEGATIVE

## 2014-07-02 LAB — TROPONIN I

## 2014-07-02 LAB — ABO/RH: ABO/RH(D): A POS

## 2014-07-02 SURGERY — INSERTION, INTRAMEDULLARY ROD, FEMUR
Anesthesia: General | Laterality: Left

## 2014-07-02 MED ORDER — MENTHOL 3 MG MT LOZG
1.0000 | LOZENGE | OROMUCOSAL | Status: DC | PRN
  Filled 2014-07-02: qty 9

## 2014-07-02 MED ORDER — LIDOCAINE HCL (CARDIAC) 20 MG/ML IV SOLN
INTRAVENOUS | Status: DC | PRN
  Administered 2014-07-02: 60 mg via INTRAVENOUS

## 2014-07-02 MED ORDER — FOLIC ACID 1 MG PO TABS
1.0000 mg | ORAL_TABLET | Freq: Every day | ORAL | Status: DC
  Administered 2014-07-02 – 2014-07-04 (×3): 1 mg via ORAL
  Filled 2014-07-02 (×3): qty 1

## 2014-07-02 MED ORDER — ALPRAZOLAM 1 MG PO TABS
1.0000 mg | ORAL_TABLET | Freq: Three times a day (TID) | ORAL | Status: DC
  Administered 2014-07-02 – 2014-07-04 (×6): 1 mg via ORAL
  Filled 2014-07-02: qty 1
  Filled 2014-07-02: qty 2
  Filled 2014-07-02 (×4): qty 1

## 2014-07-02 MED ORDER — ASPIRIN EC 325 MG PO TBEC
325.0000 mg | DELAYED_RELEASE_TABLET | Freq: Every day | ORAL | Status: DC
  Administered 2014-07-03: 325 mg via ORAL
  Filled 2014-07-02 (×2): qty 1

## 2014-07-02 MED ORDER — ONDANSETRON HCL 4 MG/2ML IJ SOLN: 4.0000 mg | Freq: Four times a day (QID) | INTRAMUSCULAR | Status: DC | PRN

## 2014-07-02 MED ORDER — LORAZEPAM 2 MG/ML IJ SOLN: 1.0000 mg | Freq: Four times a day (QID) | INTRAMUSCULAR | Status: DC | PRN

## 2014-07-02 MED ORDER — CLINDAMYCIN PHOSPHATE 900 MG/50ML IV SOLN
900.0000 mg | Freq: Once | INTRAVENOUS | Status: AC
  Administered 2014-07-02: 900 mg via INTRAVENOUS

## 2014-07-02 MED ORDER — DEXTROSE 50 % IV SOLN
INTRAVENOUS | Status: AC
  Filled 2014-07-02: qty 50

## 2014-07-02 MED ORDER — CEFAZOLIN SODIUM-DEXTROSE 2-3 GM-% IV SOLR
2.0000 g | INTRAVENOUS | Status: DC
  Filled 2014-07-02: qty 50

## 2014-07-02 MED ORDER — CLOPIDOGREL BISULFATE 75 MG PO TABS
75.0000 mg | ORAL_TABLET | Freq: Every day | ORAL | Status: DC
  Administered 2014-07-03 – 2014-07-04 (×2): 75 mg via ORAL
  Filled 2014-07-02 (×2): qty 1

## 2014-07-02 MED ORDER — EPHEDRINE SULFATE 50 MG/ML IJ SOLN
INTRAMUSCULAR | Status: DC | PRN
  Administered 2014-07-02 (×2): 5 mg via INTRAVENOUS

## 2014-07-02 MED ORDER — FENTANYL CITRATE 0.05 MG/ML IJ SOLN
50.0000 ug | Freq: Once | INTRAMUSCULAR | Status: AC
  Administered 2014-07-02: 50 ug via INTRAVENOUS
  Filled 2014-07-02: qty 2

## 2014-07-02 MED ORDER — MORPHINE SULFATE 4 MG/ML IJ SOLN
4.0000 mg | Freq: Once | INTRAMUSCULAR | Status: DC
  Filled 2014-07-02: qty 1

## 2014-07-02 MED ORDER — LACTATED RINGERS IV SOLN
INTRAVENOUS | Status: DC | PRN
  Administered 2014-07-02: 17:00:00 via INTRAVENOUS

## 2014-07-02 MED ORDER — HEPARIN SODIUM (PORCINE) 5000 UNIT/ML IJ SOLN
5000.0000 [IU] | Freq: Three times a day (TID) | INTRAMUSCULAR | Status: DC
  Filled 2014-07-02 (×3): qty 1

## 2014-07-02 MED ORDER — PROPOFOL 10 MG/ML IV BOLUS
INTRAVENOUS | Status: AC
  Filled 2014-07-02: qty 20

## 2014-07-02 MED ORDER — FENTANYL CITRATE 0.05 MG/ML IJ SOLN
INTRAMUSCULAR | Status: AC
  Filled 2014-07-02: qty 5

## 2014-07-02 MED ORDER — ALBUTEROL SULFATE HFA 108 (90 BASE) MCG/ACT IN AERS: 2.0000 | INHALATION_SPRAY | Freq: Four times a day (QID) | RESPIRATORY_TRACT | Status: DC | PRN

## 2014-07-02 MED ORDER — VITAMIN B-1 100 MG PO TABS
100.0000 mg | ORAL_TABLET | Freq: Every day | ORAL | Status: DC
  Administered 2014-07-02 – 2014-07-04 (×3): 100 mg via ORAL
  Filled 2014-07-02 (×3): qty 1

## 2014-07-02 MED ORDER — MORPHINE SULFATE 2 MG/ML IJ SOLN
2.0000 mg | INTRAMUSCULAR | Status: DC | PRN
Start: 2014-07-02 — End: 2014-07-02

## 2014-07-02 MED ORDER — CHLORHEXIDINE GLUCONATE 4 % EX LIQD
60.0000 mL | Freq: Once | CUTANEOUS | Status: DC
  Filled 2014-07-02: qty 60

## 2014-07-02 MED ORDER — ATORVASTATIN CALCIUM 40 MG PO TABS
40.0000 mg | ORAL_TABLET | Freq: Every day | ORAL | Status: DC
  Administered 2014-07-02 – 2014-07-04 (×3): 40 mg via ORAL
  Filled 2014-07-02 (×4): qty 1

## 2014-07-02 MED ORDER — HYDROMORPHONE HCL 1 MG/ML IJ SOLN
0.2500 mg | INTRAMUSCULAR | Status: DC | PRN
  Administered 2014-07-02 (×2): 0.5 mg via INTRAVENOUS

## 2014-07-02 MED ORDER — OXYCODONE-ACETAMINOPHEN 5-325 MG PO TABS
2.0000 | ORAL_TABLET | Freq: Four times a day (QID) | ORAL | Status: DC | PRN
  Administered 2014-07-02 – 2014-07-04 (×5): 2 via ORAL
  Filled 2014-07-02 (×5): qty 2

## 2014-07-02 MED ORDER — MORPHINE SULFATE 2 MG/ML IJ SOLN
2.0000 mg | INTRAMUSCULAR | Status: DC | PRN
  Administered 2014-07-02: 2 mg via INTRAMUSCULAR
  Filled 2014-07-02: qty 1

## 2014-07-02 MED ORDER — SODIUM CHLORIDE 0.9 % IV SOLN: INTRAVENOUS | Status: DC

## 2014-07-02 MED ORDER — SODIUM CHLORIDE 0.9 % IV SOLN
INTRAVENOUS | Status: DC
  Administered 2014-07-02: 08:00:00 via INTRAVENOUS

## 2014-07-02 MED ORDER — ONDANSETRON HCL 4 MG/2ML IJ SOLN
INTRAMUSCULAR | Status: DC | PRN
  Administered 2014-07-02: 4 mg via INTRAVENOUS

## 2014-07-02 MED ORDER — MORPHINE SULFATE 2 MG/ML IJ SOLN: 0.5000 mg | INTRAMUSCULAR | Status: DC | PRN

## 2014-07-02 MED ORDER — FENTANYL CITRATE 0.05 MG/ML IJ SOLN
INTRAMUSCULAR | Status: AC
  Filled 2014-07-02: qty 2

## 2014-07-02 MED ORDER — MIDAZOLAM HCL 5 MG/5ML IJ SOLN
INTRAMUSCULAR | Status: DC | PRN
  Administered 2014-07-02 (×2): 1 mg via INTRAVENOUS

## 2014-07-02 MED ORDER — METHOCARBAMOL 1000 MG/10ML IJ SOLN
500.0000 mg | Freq: Four times a day (QID) | INTRAVENOUS | Status: DC | PRN
  Administered 2014-07-02: 500 mg via INTRAVENOUS
  Filled 2014-07-02: qty 5

## 2014-07-02 MED ORDER — VANCOMYCIN HCL IN DEXTROSE 1-5 GM/200ML-% IV SOLN
1000.0000 mg | Freq: Two times a day (BID) | INTRAVENOUS | Status: AC
  Administered 2014-07-03: 1000 mg via INTRAVENOUS
  Filled 2014-07-02: qty 200

## 2014-07-02 MED ORDER — PHENOL 1.4 % MT LIQD: 1.0000 | OROMUCOSAL | Status: DC | PRN

## 2014-07-02 MED ORDER — CHLORHEXIDINE GLUCONATE 4 % EX LIQD: 60.0000 mL | Freq: Once | CUTANEOUS | Status: DC

## 2014-07-02 MED ORDER — METOCLOPRAMIDE HCL 10 MG PO TABS: 5.0000 mg | ORAL_TABLET | Freq: Three times a day (TID) | ORAL | Status: DC | PRN

## 2014-07-02 MED ORDER — VANCOMYCIN HCL IN DEXTROSE 1-5 GM/200ML-% IV SOLN
INTRAVENOUS | Status: AC
  Filled 2014-07-02: qty 200

## 2014-07-02 MED ORDER — ONDANSETRON HCL 4 MG/2ML IJ SOLN
INTRAMUSCULAR | Status: AC
  Filled 2014-07-02: qty 2

## 2014-07-02 MED ORDER — NICOTINE 21 MG/24HR TD PT24
21.0000 mg | MEDICATED_PATCH | Freq: Every day | TRANSDERMAL | Status: DC
  Administered 2014-07-02 – 2014-07-04 (×3): 21 mg via TRANSDERMAL
  Filled 2014-07-02 (×3): qty 1

## 2014-07-02 MED ORDER — 0.9 % SODIUM CHLORIDE (POUR BTL) OPTIME
TOPICAL | Status: DC | PRN
  Administered 2014-07-02: 1000 mL

## 2014-07-02 MED ORDER — GLYCOPYRROLATE 0.2 MG/ML IJ SOLN
INTRAMUSCULAR | Status: DC | PRN
  Administered 2014-07-02 (×2): 0.2 mg via INTRAVENOUS

## 2014-07-02 MED ORDER — ADULT MULTIVITAMIN W/MINERALS CH
1.0000 | ORAL_TABLET | Freq: Every day | ORAL | Status: DC
  Administered 2014-07-02 – 2014-07-04 (×3): 1 via ORAL
  Filled 2014-07-02 (×3): qty 1

## 2014-07-02 MED ORDER — FENTANYL CITRATE 0.05 MG/ML IJ SOLN
INTRAMUSCULAR | Status: DC | PRN
  Administered 2014-07-02: 75 ug via INTRAVENOUS
  Administered 2014-07-02: 50 ug via INTRAVENOUS
  Administered 2014-07-02: 25 ug via INTRAVENOUS
  Administered 2014-07-02: 50 ug via INTRAVENOUS
  Administered 2014-07-02 (×2): 25 ug via INTRAVENOUS
  Administered 2014-07-02 (×3): 50 ug via INTRAVENOUS

## 2014-07-02 MED ORDER — LORAZEPAM 1 MG PO TABS: 1.0000 mg | ORAL_TABLET | Freq: Four times a day (QID) | ORAL | Status: DC | PRN

## 2014-07-02 MED ORDER — PROMETHAZINE HCL 25 MG/ML IJ SOLN: 6.2500 mg | INTRAMUSCULAR | Status: DC | PRN

## 2014-07-02 MED ORDER — MORPHINE SULFATE 4 MG/ML IJ SOLN
4.0000 mg | Freq: Once | INTRAMUSCULAR | Status: AC
  Administered 2014-07-02: 4 mg via INTRAMUSCULAR

## 2014-07-02 MED ORDER — ONDANSETRON HCL 4 MG PO TABS: 4.0000 mg | ORAL_TABLET | Freq: Four times a day (QID) | ORAL | Status: DC | PRN

## 2014-07-02 MED ORDER — MIDAZOLAM HCL 2 MG/2ML IJ SOLN
INTRAMUSCULAR | Status: AC
  Filled 2014-07-02: qty 2

## 2014-07-02 MED ORDER — PANTOPRAZOLE SODIUM 40 MG PO TBEC
40.0000 mg | DELAYED_RELEASE_TABLET | Freq: Every day | ORAL | Status: DC
  Administered 2014-07-02 – 2014-07-04 (×3): 40 mg via ORAL
  Filled 2014-07-02 (×3): qty 1

## 2014-07-02 MED ORDER — VANCOMYCIN HCL IN DEXTROSE 1-5 GM/200ML-% IV SOLN
1000.0000 mg | Freq: Once | INTRAVENOUS | Status: DC
  Filled 2014-07-02: qty 200

## 2014-07-02 MED ORDER — ALBUTEROL SULFATE (2.5 MG/3ML) 0.083% IN NEBU: 2.5000 mg | INHALATION_SOLUTION | Freq: Four times a day (QID) | RESPIRATORY_TRACT | Status: DC | PRN

## 2014-07-02 MED ORDER — SODIUM CHLORIDE 0.9 % IJ SOLN
3.0000 mL | Freq: Two times a day (BID) | INTRAMUSCULAR | Status: DC
  Administered 2014-07-02: 3 mL via INTRAVENOUS

## 2014-07-02 MED ORDER — ACETAMINOPHEN 325 MG PO TABS: 650.0000 mg | ORAL_TABLET | Freq: Four times a day (QID) | ORAL | Status: DC | PRN

## 2014-07-02 MED ORDER — ROCURONIUM BROMIDE 100 MG/10ML IV SOLN
INTRAVENOUS | Status: AC
  Filled 2014-07-02: qty 1

## 2014-07-02 MED ORDER — METOCLOPRAMIDE HCL 5 MG/ML IJ SOLN: 5.0000 mg | Freq: Three times a day (TID) | INTRAMUSCULAR | Status: DC | PRN

## 2014-07-02 MED ORDER — PROPOFOL 10 MG/ML IV BOLUS
INTRAVENOUS | Status: DC | PRN
  Administered 2014-07-02: 170 mg via INTRAVENOUS

## 2014-07-02 MED ORDER — LACTATED RINGERS IV SOLN
INTRAVENOUS | Status: DC
  Administered 2014-07-02: 1000 mL via INTRAVENOUS
  Administered 2014-07-02 (×2): via INTRAVENOUS

## 2014-07-02 MED ORDER — HYDROMORPHONE HCL 1 MG/ML IJ SOLN
INTRAMUSCULAR | Status: AC
  Filled 2014-07-02: qty 1

## 2014-07-02 MED ORDER — VANCOMYCIN HCL IN DEXTROSE 1-5 GM/200ML-% IV SOLN
1000.0000 mg | INTRAVENOUS | Status: AC
  Administered 2014-07-02: 1000 mg via INTRAVENOUS
  Filled 2014-07-02: qty 200

## 2014-07-02 MED ORDER — NEOSTIGMINE METHYLSULFATE 10 MG/10ML IV SOLN
INTRAVENOUS | Status: DC | PRN
  Administered 2014-07-02: 2 mg via INTRAVENOUS

## 2014-07-02 MED ORDER — ISOPROPYL ALCOHOL 70 % SOLN
Status: AC
  Filled 2014-07-02: qty 480

## 2014-07-02 MED ORDER — THIAMINE HCL 100 MG/ML IJ SOLN
100.0000 mg | Freq: Every day | INTRAMUSCULAR | Status: DC
  Filled 2014-07-02 (×2): qty 1

## 2014-07-02 MED ORDER — HYDROCODONE-ACETAMINOPHEN 5-325 MG PO TABS
1.0000 | ORAL_TABLET | Freq: Four times a day (QID) | ORAL | Status: DC | PRN
  Administered 2014-07-03 – 2014-07-04 (×4): 2 via ORAL
  Filled 2014-07-02 (×4): qty 2

## 2014-07-02 MED ORDER — MORPHINE SULFATE 2 MG/ML IJ SOLN
2.0000 mg | INTRAMUSCULAR | Status: DC | PRN
  Administered 2014-07-02 (×3): 2 mg via INTRAVENOUS
  Filled 2014-07-02 (×3): qty 1

## 2014-07-02 MED ORDER — LIDOCAINE HCL (CARDIAC) 20 MG/ML IV SOLN
INTRAVENOUS | Status: AC
  Filled 2014-07-02: qty 5

## 2014-07-02 MED ORDER — ROCURONIUM BROMIDE 100 MG/10ML IV SOLN
INTRAVENOUS | Status: DC | PRN
  Administered 2014-07-02: 30 mg via INTRAVENOUS

## 2014-07-02 MED ORDER — SODIUM CHLORIDE 0.9 % IV BOLUS (SEPSIS)
1000.0000 mL | Freq: Once | INTRAVENOUS | Status: AC
  Administered 2014-07-02: 1000 mL via INTRAVENOUS

## 2014-07-02 MED ORDER — ACETAMINOPHEN 650 MG RE SUPP: 650.0000 mg | Freq: Four times a day (QID) | RECTAL | Status: DC | PRN

## 2014-07-02 SURGICAL SUPPLY — 55 items
BAG SPEC THK2 15X12 ZIP CLS (MISCELLANEOUS)
BAG ZIPLOCK 12X15 (MISCELLANEOUS) ×1 IMPLANT
BIT DRILL AO GAMMA 4.2X180 (BIT) ×2 IMPLANT
BNDG CMPR MED 10X6 ELC LF (GAUZE/BANDAGES/DRESSINGS) ×1
BNDG ELASTIC 6X10 VLCR STRL LF (GAUZE/BANDAGES/DRESSINGS) ×2 IMPLANT
BNDG GAUZE ELAST 4 BULKY (GAUZE/BANDAGES/DRESSINGS) ×1 IMPLANT
CHLORAPREP W/TINT 26ML (MISCELLANEOUS) ×2 IMPLANT
COVER PERINEAL POST (MISCELLANEOUS) ×3 IMPLANT
DRAPE C-ARM 42X120 X-RAY (DRAPES) ×2 IMPLANT
DRAPE C-ARMOR (DRAPES) ×2 IMPLANT
DRAPE INCISE IOBAN 66X45 STRL (DRAPES) ×3 IMPLANT
DRAPE ORTHO SPLIT 77X108 STRL (DRAPES) ×6
DRAPE STERI IOBAN 125X83 (DRAPES) ×1 IMPLANT
DRAPE SURG ORHT 6 SPLT 77X108 (DRAPES) IMPLANT
DRAPE U-SHAPE 47X51 STRL (DRAPES) ×4 IMPLANT
DRSG AQUACEL AG ADV 3.5X 4 (GAUZE/BANDAGES/DRESSINGS) ×2 IMPLANT
DRSG AQUACEL AG ADV 3.5X 6 (GAUZE/BANDAGES/DRESSINGS) ×1 IMPLANT
DRSG MEPILEX BORDER 4X4 (GAUZE/BANDAGES/DRESSINGS) ×6 IMPLANT
DURAPREP 26ML APPLICATOR (WOUND CARE) ×1 IMPLANT
ELECT REM PT RETURN 9FT ADLT (ELECTROSURGICAL) ×3
ELECTRODE REM PT RTRN 9FT ADLT (ELECTROSURGICAL) ×1 IMPLANT
GAUZE SPONGE 4X4 12PLY STRL (GAUZE/BANDAGES/DRESSINGS) ×1 IMPLANT
GLOVE BIOGEL PI IND STRL 7.5 (GLOVE) ×1 IMPLANT
GLOVE BIOGEL PI IND STRL 8.5 (GLOVE) ×1 IMPLANT
GLOVE BIOGEL PI INDICATOR 7.5 (GLOVE)
GLOVE BIOGEL PI INDICATOR 8.5 (GLOVE) ×2
GLOVE ECLIPSE 8.0 STRL XLNG CF (GLOVE) IMPLANT
GLOVE ORTHO TXT STRL SZ7.5 (GLOVE) ×2 IMPLANT
GLOVE SURG ORTHO 8.0 STRL STRW (GLOVE) ×1 IMPLANT
GLOVE SURG ORTHO 8.5 STRL (GLOVE) ×8 IMPLANT
GOWN SPEC L3 XXLG W/TWL (GOWN DISPOSABLE) ×4 IMPLANT
GOWN STRL REUS W/TWL LRG LVL3 (GOWN DISPOSABLE) ×3 IMPLANT
GUIDEROD T2 3X1000 (ROD) ×2 IMPLANT
K-WIRE  3.2X450M STR (WIRE) ×2
K-WIRE 3.2X450M STR (WIRE) ×1
KIT BASIN OR (CUSTOM PROCEDURE TRAY) ×3 IMPLANT
KWIRE 3.2X450M STR (WIRE) IMPLANT
LEGGING LITHOTOMY PAIR STRL (DRAPES) ×2 IMPLANT
LIQUID BAND (GAUZE/BANDAGES/DRESSINGS) ×2 IMPLANT
MANIFOLD NEPTUNE II (INSTRUMENTS) ×3 IMPLANT
NAIL KIT LG LT 11-440-125-FEM (Nail) ×2 IMPLANT
PACK GENERAL/GYN (CUSTOM PROCEDURE TRAY) ×3 IMPLANT
POSITIONER SURGICAL ARM (MISCELLANEOUS) ×3 IMPLANT
REAMER SHAFT BIXCUT (INSTRUMENTS) ×2 IMPLANT
SCREW LAG GAMMA 3 TI 10.5X100M (Screw) ×2 IMPLANT
SCREW LOCKING T2 F/T  5MMX50MM (Screw) ×2 IMPLANT
SCREW LOCKING T2 F/T 5MMX50MM (Screw) IMPLANT
SUT MNCRL AB 3-0 PS2 18 (SUTURE) ×4 IMPLANT
SUT MON AB 2-0 SH 27 (SUTURE) ×6
SUT MON AB 2-0 SH27 (SUTURE) IMPLANT
SUT VIC AB 1 CT1 27 (SUTURE) ×3
SUT VIC AB 1 CT1 27XBRD ANTBC (SUTURE) ×1 IMPLANT
SUT VIC AB 2-0 CT1 27 (SUTURE) ×3
SUT VIC AB 2-0 CT1 27XBRD (SUTURE) ×2 IMPLANT
TUBE CLIP GAMMA CLOSED ×2 IMPLANT

## 2014-07-02 NOTE — Progress Notes (Signed)
PT Cancellation Note  Patient Details Name: [name] Pace MRN: [id number] DOB: June 22, 1950   Cancelled Treatment:    Reason Eval/Treat Not Completed: Patient not medically ready (for possible hip surgery, PT not indicated until Orthopedics  orders PT.)   Claretha Cooper 07/02/2014, 7:30 AM Tresa Endo PT [phone]

## 2014-07-02 NOTE — Anesthesia Postprocedure Evaluation (Signed)
  Anesthesia Post-op Note  Patient: [name] Pace  Procedure(s) Performed: Procedure(s) (LRB): INTRAMEDULLARY (IM) NAIL FEMORAL (Left)  Patient Location: PACU  Anesthesia Type: General  Level of Consciousness: awake and alert   Airway and Oxygen Therapy: Patient Spontanous Breathing  Post-op Pain: mild  Post-op Assessment: Post-op Vital signs reviewed, Patient's Cardiovascular Status Stable, Respiratory Function Stable, Patent Airway and No signs of Nausea or vomiting  Last Vitals:  Filed Vitals:   07/02/14 2030  BP: 137/63  Pulse: 68  Temp:   Resp: 16    Post-op Vital Signs: stable   Complications: No apparent anesthesia complications. O2Sat 98% on nasal cannula. No complaints.

## 2014-07-02 NOTE — ED Notes (Signed)
Per EMS, pt fell on the 15th here while being DC's from the floor. Pt did not have pain until in the car on the way home. Pt fell again tonight after he slipped on his floor and landed on his left side. No shortening, rotation, bruising or redness noted. Pt c/o femur pain. Pt also states he has had some beers.

## 2014-07-02 NOTE — H&P (Addendum)
[facility] History and Physical  DELONTE MUSICH ZYS:[id number] DOB: [birth year] DOA: 07/02/2014  Referring physician: ED physician PCP: No primary care provider on file.  Specialists:   Chief Complaint: left hip pain after fall  HPI: [name] Pace is a 64 y.o. male with past medical history of tobacco abuse, alcohol abuse, mild stroke without deficit, hyperlipidemia, GERD, coronary artery disease (s/p of CABG), remote history of breast cancer, remote history of prostate cancer, who presents with left hip pain after fall.  Patient reports that he drank 3 beers yesterday afternoon. At about 9:30 PM, when he was in kitchen getting something to drink, he fell from standing position landing on his left hip. He developed severe pain over left hip. He has difficulty ambulating due to the pain. No focal weakness or numbness. Denies hitting head, loss of consciousness or neck pain. Patient denies fever, chills, fatigue, headaches, cough, chest pain, SOB, abdominal pain, diarrhea, dysuria, urgency, frequency, hematuria, skin rashes or leg swelling.  Work up in the ED demonstrates linear lucencies in the intertrochanteric region of the left hip by X-ray, which is consistent with nondisplaced acute posttraumatic fractures per radiologist. Patient is admitted to inpatient for further evaluation and treatment. Orthopedic surgeon was consulted by ED.  Review of Systems: As presented in the history of presenting illness, rest negative.  Where does patient live?  At home Can patient participate in ADLs? Yes  Allergy:  Allergies  Allergen Reactions  . Penicillins Swelling    Past Medical History  Diagnosis Date  . High cholesterol   . Coronary artery disease   . Alcohol abuse   . Stroke   . Anxiety   . Prostate cancer     per patient diagnosed in early 66's  . Breast cancer Left breast    Per patient diagnosed in early 22's.     Past Surgical History  Procedure Laterality Date  . Coronary  artery bypass graft    . Carotid stent      Social History:  reports that he has been smoking Cigarettes.  He has a 42 pack-year smoking history. He has never used smokeless tobacco. He reports that he drinks alcohol. He reports that he does not use illicit drugs.  Family History:  Family History  Problem Relation Age of Onset  . Dementia Mother      Prior to Admission medications   Medication Sig Start Date End Date Taking? Authorizing Provider  ALPRAZolam Duanne Moron) 1 MG tablet Take 1 mg by mouth 3 (three) times daily.  11/19/13  Yes Historical Provider, MD  atorvastatin (LIPITOR) 40 MG tablet Take 40 mg by mouth daily.   Yes Historical Provider, MD  albuterol (PROVENTIL HFA;VENTOLIN HFA) 108 (90 BASE) MCG/ACT inhaler Inhale 2 puffs into the lungs every 6 (six) hours as needed for wheezing or shortness of breath. 2 puffs in lungs 3 times daily x 3 days then as needed. Patient not taking: Reported on 07/02/2014 06/18/14   Eugenie Filler, MD  BUTRANS 15 MCG/HR PTWK Place 1 patch onto the skin every 7 (seven) days. 06/23/14   Historical Provider, MD  clopidogrel (PLAVIX) 75 MG tablet Take 75 mg by mouth daily.    Historical Provider, MD  dextromethorphan-guaiFENesin (MUCINEX DM) 30-600 MG per 12 hr tablet Take 2 tablets by mouth 2 (two) times daily. Take for 4 days then stop. Patient not taking: Reported on 07/02/2014 06/18/14   Eugenie Filler, MD  feeding supplement, ENSURE COMPLETE, (ENSURE COMPLETE) LIQD Take  237 mLs by mouth 2 (two) times daily between meals. Patient not taking: Reported on 07/02/2014 06/18/14   Eugenie Filler, MD  folic acid (FOLVITE) 1 MG tablet Take 1 tablet (1 mg total) by mouth daily. Patient not taking: Reported on 07/02/2014 06/18/14   Eugenie Filler, MD  levofloxacin (LEVAQUIN) 750 MG tablet Take 1 tablet (750 mg total) by mouth daily at 8 pm. Take for 4 days then stop. Patient not taking: Reported on 07/02/2014 06/19/14   Eugenie Filler, MD  nicotine (NICODERM  CQ - DOSED IN MG/24 HOURS) 21 mg/24hr patch Place 1 patch (21 mg total) onto the skin daily. Patient not taking: Reported on 07/02/2014 06/18/14   Eugenie Filler, MD  omeprazole (PRILOSEC) 40 MG capsule Take 1 capsule (40 mg total) by mouth daily. Patient not taking: Reported on 07/02/2014 12/10/13   Julianne Rice, MD  ondansetron (ZOFRAN ODT) 4 MG disintegrating tablet 4mg  ODT q4 hours prn nausea/vomit Patient not taking: Reported on 07/02/2014 12/10/13   Julianne Rice, MD  thiamine (VITAMIN B-1) 100 MG tablet Take 1 tablet (100 mg total) by mouth daily. Patient not taking: Reported on 07/02/2014 06/18/14   Eugenie Filler, MD    Physical Exam: Filed Vitals:   07/02/14 0208  BP: 105/78  Pulse: 79  Temp: 98.3 F (36.8 C)  TempSrc: Oral  Resp: 20  SpO2: 100%   General: Not in acute distress HEENT:       Eyes: PERRL, EOMI, no scleral icterus       ENT: No discharge from the ears and nose, no pharynx injection, no tonsillar enlargement.        Neck: No JVD, no bruit, no mass felt. Cardiac: S1/S2, RRR, No murmurs, No gallops or rubs Pulm: Good air movement bilaterally. Clear to auscultation bilaterally. No rales, wheezing, rhonchi or rubs. Abd: Soft, nondistended, nontender, no rebound pain, no organomegaly, BS present Ext: No edema bilaterally. 2+DP/PT pulse bilaterally Musculoskeletal: tenderness to palpation over the lateral left hip. Decreased range of motion of the left hip due to pain. There is no shortening of the lower extremity.  Skin: No rashes.  Neuro: Alert and oriented X3, cranial nerves II-XII grossly intact, muscle strength 5/5 in all extremeties, sensation to light touch intact.  Psych: Patient is not psychotic, no suicidal or hemocidal ideation.  Labs on Admission:  Basic Metabolic Panel: No results for input(s): NA, K, CL, CO2, GLUCOSE, BUN, CREATININE, CALCIUM, MG, PHOS in the last 168 hours. Liver Function Tests: No results for input(s): AST, ALT, ALKPHOS,  BILITOT, PROT, ALBUMIN in the last 168 hours. No results for input(s): LIPASE, AMYLASE in the last 168 hours. No results for input(s): AMMONIA in the last 168 hours. CBC: No results for input(s): WBC, NEUTROABS, HGB, HCT, MCV, PLT in the last 168 hours. Cardiac Enzymes: No results for input(s): CKTOTAL, CKMB, CKMBINDEX, TROPONINI in the last 168 hours.  BNP (last 3 results) No results for input(s): PROBNP in the last 8760 hours. CBG: No results for input(s): GLUCAP in the last 168 hours.  Radiological Exams on Admission: Dg Chest Port 1 View  07/02/2014   CLINICAL DATA:  Preop broken hip.  Previous heart surgery.  EXAM: PORTABLE CHEST - 1 VIEW  COMPARISON:  06/14/2014  FINDINGS: Postoperative change in the mediastinum. Normal heart size and pulmonary vascularity. No focal airspace disease or consolidation in the lungs. No blunting of costophrenic angles. No pneumothorax. Mediastinal contours appear intact. Postoperative changes in the right shoulder. Degenerative  changes in the shoulders and spine.  IMPRESSION: No active disease.   Electronically Signed   By: Lucienne Capers M.D.   On: 07/02/2014 04:50   Dg Hip Unilat With Pelvis 2-3 Views Left  07/02/2014   CLINICAL DATA:  Patient fell on 06/20/2014 wall being discharged from [facility], injuring the left hip. Pain fell again this morning landing on the left hip. Bruising the lateral side of the left hip.  EXAM: DG HIP W/ PELVIS 2-3V*L*  COMPARISON:  Abdomen 12/10/2013  FINDINGS: Linear lucencies in the intertrochanteric region of the left hip consistent with nondisplaced fractures. No dislocation of the left hip. Pelvis appears intact. Degenerative changes in the lower lumbar spine and hips. Vascular calcifications.  IMPRESSION: Linear lucencies in the intertrochanteric region of the left hip consistent with nondisplaced acute posttraumatic fractures.   Electronically Signed   By: Lucienne Capers M.D.   On: 07/02/2014 03:44    EKG:  Independently reviewed. EKG showed ST segment depression in V4 to V6, which existed in previous EKG on 06/16/14.   Assessment/Plan Principal Problem:   Hip fracture, left Active Problems:   Alcohol abuse   CAD (coronary artery disease)   Tobacco abuse   History of stroke  Left hip fracture: As evidenced by x-ray. Patient has severe pain now. No neurovascular compromise. Orthopedic surgeon was consulted.  - will admit to tele bed - Pain control: morphine prn and percocet - follow up ortho recs - NPO after MN - type and cross - INR/PTT - pending CMP and CBC which were ordered by ED  CAD: s/p of CABG. No chest pain.  EKG showed ST segment depression in V4 to V6, which existed in previous EKG on 06/16/14.  -Continue Lipitor -Continue Plavix, but wiill postpone to tomorrow in case patient needs surgery. -Follow up orthopedic surgeon's recommendation. Will start Plavix today if patient does not need surgery  Hyperlipidemia: -Continue Lipitor  Tobacco abuse: -Nicotine patch -did counseling about importance of quitting smoking.  Alcohol abuse: -CIWA protocol --did counseling about importance of quitting alcohol drinking.  GERD: -Protonix  DVT ppx: SQ Heparin   Code Status: Full code Family Communication: None at bed side.       Disposition Plan: Admit to inpatient   Date of Service 07/02/2014    [name] [facility] Pager [phone]  If 7PM-7AM, please contact night-coverage [email or web address] Password [facility] 07/02/2014, 5:35 AM

## 2014-07-02 NOTE — ED Notes (Signed)
Patient states there is constant pain but the intensity changes.

## 2014-07-02 NOTE — Progress Notes (Signed)
Clinical Social Work Department BRIEF PSYCHOSOCIAL ASSESSMENT 07/02/2014  Patient:  [name] Pace, [name] Pace     Account Number:  [id number]     Admit date:  07/02/2014  Clinical Social Worker:  Nitasha Jewel Inez Catalina  Date/Time:  07/02/2014 08:30 AM  Referred by:  CSW  Date Referred:  07/02/2014 Referred for  SNF Placement   Other Referral:   Interview type:  Patient Other interview type:    PSYCHOSOCIAL DATA Living Status:  ALONE Admitted from facility:   Level of care:   Primary support name:  n/a Primary support relationship to patient:   Degree of support available:    CURRENT CONCERNS Current Concerns  Post-Acute Placement   Other Concerns:    SOCIAL WORK ASSESSMENT / PLAN CSW met with pt at bedside to complete psychosocial assessment. Pt shared he lives at home alone and has no strong supports. Patient shared that he has a nurse that comes to assist wtih pt needs. CSW and pt discussed pt admission status. Pt shared he is waiting to meet with surgeon regarding his hip fracture. Patient shared he is interested in skillled nursing facility placement for short term rehab if needed. Pt expressed pain when trying to laugh and joke. Patient states, I know I wont be able to go home right after surgery.  Patient requested csw to look into [facility] options, to see what is available.    CSW will complete fl2 for MD signature and start placement process.   Assessment/plan status:  Psychosocial Support/Ongoing Assessment of Needs Other assessment/ plan:   Information/referral to community resources:   skilled nursing facility    PATIENT'S/FAMILY'S RESPONSE TO PLAN OF CARE: Patient thanked csw for concern and support. Patient plans to go to short term rehab at skilled nursing facility once medically stable.       Noreene Larsson [phone]  ED CSW 07/02/2014 907am

## 2014-07-02 NOTE — ED Notes (Signed)
Patient is complaining of left hip and thigh pain. Pt has bruising to the anterior, left side of pelvis, inner left thigh. Tender to touch. Redness to right knee. Pulse noted in left foot. Denies any numbness or tingling down left leg.

## 2014-07-02 NOTE — ED Notes (Signed)
Admitting nurse at bedside 

## 2014-07-02 NOTE — ED Notes (Signed)
MD at bedside. Hospitalist

## 2014-07-02 NOTE — Consult Note (Signed)
Reason for Consult:left hip fracture Referring Physician: Hospitalist  HPI: [name] Pace is an 64 y.o. male who sustained a fall on a recent hospitalization at the beginning of the month and has had left hip pain requiring ambulation with cane since that episode. He states he was at home yesterday and didn't feel well and as he got up he fell again now with severe left hip pain and inability to ambulate. Brought to [facility] ED and found to have non displaced left intertrochanteric hip fracture.  Past Medical History  Diagnosis Date  . High cholesterol   . Coronary artery disease   . Alcohol abuse   . Stroke   . Anxiety   . Prostate cancer     per patient diagnosed in early 13's  . Breast cancer Left breast    Per patient diagnosed in early 15's.     Past Surgical History  Procedure Laterality Date  . Coronary artery bypass graft    . Carotid stent      Family History  Problem Relation Age of Onset  . Dementia Mother     Social History:  reports that he has been smoking Cigarettes.  He has a 42 pack-year smoking history. He has never used smokeless tobacco. He reports that he drinks alcohol. He reports that he does not use illicit drugs.  Allergies:  Allergies  Allergen Reactions  . Penicillins Swelling    Medications: I have reviewed the patient's current medications.  Results for orders placed or performed during the hospital encounter of 07/02/14 (from the past 48 hour(s))  CBC with Differential/Platelet     Status: Abnormal   Collection Time: 07/02/14  6:13 AM  Result Value Ref Range   WBC 10.8 (H) 4.0 - 10.5 K/uL   RBC 3.87 (L) 4.22 - 5.81 MIL/uL   Hemoglobin 12.9 (L) 13.0 - 17.0 g/dL   HCT 36.8 (L) 39.0 - 52.0 %   MCV 95.1 78.0 - 100.0 fL   MCH 33.3 26.0 - 34.0 pg   MCHC 35.1 30.0 - 36.0 g/dL   RDW 12.7 11.5 - 15.5 %   Platelets 360 150 - 400 K/uL   Neutrophils Relative % 75 43 - 77 %   Neutro Abs 8.2 (H) 1.7 - 7.7 K/uL   Lymphocytes Relative 14 12 - 46 %   Lymphs Abs 1.6 0.7 - 4.0 K/uL   Monocytes Relative 8 3 - 12 %   Monocytes Absolute 0.9 0.1 - 1.0 K/uL   Eosinophils Relative 2 0 - 5 %   Eosinophils Absolute 0.2 0.0 - 0.7 K/uL   Basophils Relative 1 0 - 1 %   Basophils Absolute 0.1 0.0 - 0.1 K/uL  Comprehensive metabolic panel     Status: Abnormal   Collection Time: 07/02/14  6:13 AM  Result Value Ref Range   Sodium 131 (L) 135 - 145 mmol/L   Potassium 3.8 3.5 - 5.1 mmol/L   Chloride 102 96 - 112 mmol/L   CO2 19 19 - 32 mmol/L   Glucose, Bld 96 70 - 99 mg/dL   BUN 7 6 - 23 mg/dL   Creatinine, Ser 0.58 0.50 - 1.35 mg/dL   Calcium 8.1 (L) 8.4 - 10.5 mg/dL   Total Protein 6.0 6.0 - 8.3 g/dL   Albumin 3.1 (L) 3.5 - 5.2 g/dL   AST 17 0 - 37 U/L   ALT 16 0 - 53 U/L   Alkaline Phosphatase 127 (H) 39 - 117 U/L   Total  Bilirubin 0.5 0.3 - 1.2 mg/dL   GFR calc non Af Amer >90 >90 mL/min   GFR calc Af Amer >90 >90 mL/min    Comment: (NOTE) The eGFR has been calculated using the CKD EPI equation. This calculation has not been validated in all clinical situations. eGFR's persistently <90 mL/min signify possible Chronic Kidney Disease.    Anion gap 10 5 - 15  Troponin I     Status: None   Collection Time: 07/02/14  6:13 AM  Result Value Ref Range   Troponin I <0.03 <0.031 ng/mL    Comment:        NO INDICATION OF MYOCARDIAL INJURY.     Dg Chest Port 1 View  07/02/2014   CLINICAL DATA:  Preop broken hip.  Previous heart surgery.  EXAM: PORTABLE CHEST - 1 VIEW  COMPARISON:  06/14/2014  FINDINGS: Postoperative change in the mediastinum. Normal heart size and pulmonary vascularity. No focal airspace disease or consolidation in the lungs. No blunting of costophrenic angles. No pneumothorax. Mediastinal contours appear intact. Postoperative changes in the right shoulder. Degenerative changes in the shoulders and spine.  IMPRESSION: No active disease.   Electronically Signed   By: Lucienne Capers M.D.   On: 07/02/2014 04:50   Dg Hip Unilat  With Pelvis 2-3 Views Left  07/02/2014   CLINICAL DATA:  Patient fell on 06/20/2014 wall being discharged from [facility], injuring the left hip. Pain fell again this morning landing on the left hip. Bruising the lateral side of the left hip.  EXAM: DG HIP W/ PELVIS 2-3V*L*  COMPARISON:  Abdomen 12/10/2013  FINDINGS: Linear lucencies in the intertrochanteric region of the left hip consistent with nondisplaced fractures. No dislocation of the left hip. Pelvis appears intact. Degenerative changes in the lower lumbar spine and hips. Vascular calcifications.  IMPRESSION: Linear lucencies in the intertrochanteric region of the left hip consistent with nondisplaced acute posttraumatic fractures.   Electronically Signed   By: Lucienne Capers M.D.   On: 07/02/2014 03:44   Dg Femur Min 2 Views Left  07/02/2014   CLINICAL DATA:  Intertrochanteric fracture LEFT femur, LEFT hip pain post fall on 06/20/2014  EXAM: DG FEMUR 2+V*L*  COMPARISON:  07/02/2014 LEFT hip radiographs  FINDINGS: Osseous demineralization.  LEFT hip and SI joint spaces preserved.  Joint space narrowing with spur formation at LEFT knee, greatest at lateral compartment.  Nondisplaced intertrochanteric fracture LEFT femur.  No additional fracture, dislocation, or bone destruction.  IMPRESSION: Nondisplaced intertrochanteric fracture LEFT femur.  Osseous demineralization.  Degenerative changes LEFT knee.   Electronically Signed   By: Lavonia Dana M.D.   On: 07/02/2014 08:05    ROS: as in admission H&P,   Physical Exam:  Pt alert and oriented but in severe pain.  He is NVI to light touch and has good distal pulses. Good DF/PF toes but any attempts at further exam produce severe pain and spasm to the left hip.   Vitals Temp:  [98.1 F (36.7 C)-98.3 F (36.8 C)] 98.2 F (36.8 C) (01/27 0658) Pulse Rate:  [63-79] 63 (01/27 0658) Resp:  [15-20] 16 (01/27 0726) BP: (81-138)/(56-89) 138/77 mmHg (01/27 0726) SpO2:  [96 %-100 %] 98 %  (01/27 0726) There is no weight on file to calculate BMI.  Assessment/Plan: Impression: Left nondisplaced intertrochanteric hip fracture   Treatment: will keep NPO, additional meds ordered for pain.  Plan will be to perform ORIF with gamma nail later today with Dr. Rod Can OP permit  ordered and risks benefits discussed with patient who agrees.  Othell Jaime ,PA-C 07/02/2014, 8:11 AM

## 2014-07-02 NOTE — ED Notes (Signed)
Bed: WN02 Expected date:  Expected time:  Means of arrival:  Comments: EMS 64yo m, multiple falls, left hip pain

## 2014-07-02 NOTE — Anesthesia Preprocedure Evaluation (Addendum)
Anesthesia Evaluation  Patient identified by MRN, date of birth, ID band Patient awake    Reviewed: Allergy & Precautions, NPO status , Patient's Chart, lab work & pertinent test results  Airway Mallampati: II  TM Distance: >3 FB Neck ROM: Full    Dental no notable dental hx.    Pulmonary pneumonia -, resolved, Current Smoker,  breath sounds clear to auscultation  Pulmonary exam normal       Cardiovascular Exercise Tolerance: Good + CAD and + Peripheral Vascular Disease Rhythm:Regular Rate:Normal  ECG and CXR reviewed.   Neuro/Psych Anxiety CVA    GI/Hepatic negative GI ROS, Neg liver ROS,   Endo/Other  negative endocrine ROS  Renal/GU negative Renal ROS  negative genitourinary   Musculoskeletal negative musculoskeletal ROS (+)   Abdominal   Peds negative pediatric ROS (+)  Hematology negative hematology ROS (+)   Anesthesia Other Findings   Reproductive/Obstetrics negative OB ROS                            Anesthesia Physical Anesthesia Plan  ASA: III  Anesthesia Plan: General   Post-op Pain Management:    Induction: Intravenous  Airway Management Planned: Oral ETT  Additional Equipment:   Intra-op Plan:   Post-operative Plan: Extubation in OR  Informed Consent: I have reviewed the patients History and Physical, chart, labs and discussed the procedure including the risks, benefits and alternatives for the proposed anesthesia with the patient or authorized representative who has indicated his/her understanding and acceptance.   Dental advisory given  Plan Discussed with: CRNA  Anesthesia Plan Comments: (Plavix 07-01-14. Plan general.)        Anesthesia Quick Evaluation

## 2014-07-02 NOTE — Addendum Note (Signed)
Addendum  created 07/02/14 2048 by Ofilia Neas, CRNA   Modules edited: Charges VN

## 2014-07-02 NOTE — Progress Notes (Signed)
[facility]  Patient Status: Active (receiving services up to time of hospitalization)  [facility] is providing the following services: RN, PT, OT and MSW  If patient discharges after hours, please call [phone].   [name] Pace 07/02/2014, 2:07 PM

## 2014-07-02 NOTE — ED Notes (Signed)
Attempted IV start x 2. Successful cannulation but veins infiltrated with flushing.

## 2014-07-02 NOTE — Brief Op Note (Signed)
07/02/2014  6:54 PM  PATIENT:  [name] Pace  64 y.o. male  PRE-OPERATIVE DIAGNOSIS:  fractured left femur  POST-OPERATIVE DIAGNOSIS:  fractured left femur  PROCEDURE:  Procedure(s): INTRAMEDULLARY (IM) NAIL FEMORAL (Left)  SURGEON:  Surgeon(s) and Role:    * Elie Goody, MD - Primary  PHYSICIAN ASSISTANT:   ASSISTANTS: none   ANESTHESIA:   general  EBL:  Total I/O In: -  Out: 900 [Urine:800; Blood:100]  BLOOD ADMINISTERED:none  DRAINS: none   LOCAL MEDICATIONS USED:  NONE  SPECIMEN:  No Specimen  DISPOSITION OF SPECIMEN:  N/A  COUNTS:  YES  TOURNIQUET:  * No tourniquets in log *  DICTATION: .Other Dictation: Dictation Number pending  PLAN OF CARE: Admit to inpatient   PATIENT DISPOSITION:  PACU - hemodynamically stable.   Delay start of Pharmacological VTE agent (>24hrs) due to surgical blood loss or risk of bleeding: no

## 2014-07-02 NOTE — Interval H&P Note (Signed)
History and Physical Interval Note:  07/02/2014 4:18 PM  [name] Pace  has presented today for surgery, with the diagnosis of fractured left  The various methods of treatment have been discussed with the patient and family. After consideration of risks, benefits and other options for treatment, the patient has consented to  Procedure(s): INTRAMEDULLARY (IM) NAIL FEMORAL (Left) as a surgical intervention .  The patient's history has been reviewed, patient examined, no change in status, stable for surgery.  I have reviewed the patient's chart and labs.  Questions were answered to the patient's satisfaction.     [name], [name]

## 2014-07-02 NOTE — H&P (View-Only) (Signed)
Reason for Consult:left hip fracture Referring Physician: Hospitalist  HPI: [name] Pace is an 64 y.o. male who sustained a fall on a recent hospitalization at the beginning of the month and has had left hip pain requiring ambulation with cane since that episode. He states he was at home yesterday and didn't feel well and as he got up he fell again now with severe left hip pain and inability to ambulate. Brought to [facility] ED and found to have non displaced left intertrochanteric hip fracture.  Past Medical History  Diagnosis Date  . High cholesterol   . Coronary artery disease   . Alcohol abuse   . Stroke   . Anxiety   . Prostate cancer     per patient diagnosed in early 13's  . Breast cancer Left breast    Per patient diagnosed in early 15's.     Past Surgical History  Procedure Laterality Date  . Coronary artery bypass graft    . Carotid stent      Family History  Problem Relation Age of Onset  . Dementia Mother     Social History:  reports that he has been smoking Cigarettes.  He has a 42 pack-year smoking history. He has never used smokeless tobacco. He reports that he drinks alcohol. He reports that he does not use illicit drugs.  Allergies:  Allergies  Allergen Reactions  . Penicillins Swelling    Medications: I have reviewed the patient's current medications.  Results for orders placed or performed during the hospital encounter of 07/02/14 (from the past 48 hour(s))  CBC with Differential/Platelet     Status: Abnormal   Collection Time: 07/02/14  6:13 AM  Result Value Ref Range   WBC 10.8 (H) 4.0 - 10.5 K/uL   RBC 3.87 (L) 4.22 - 5.81 MIL/uL   Hemoglobin 12.9 (L) 13.0 - 17.0 g/dL   HCT 36.8 (L) 39.0 - 52.0 %   MCV 95.1 78.0 - 100.0 fL   MCH 33.3 26.0 - 34.0 pg   MCHC 35.1 30.0 - 36.0 g/dL   RDW 12.7 11.5 - 15.5 %   Platelets 360 150 - 400 K/uL   Neutrophils Relative % 75 43 - 77 %   Neutro Abs 8.2 (H) 1.7 - 7.7 K/uL   Lymphocytes Relative 14 12 - 46 %   Lymphs Abs 1.6 0.7 - 4.0 K/uL   Monocytes Relative 8 3 - 12 %   Monocytes Absolute 0.9 0.1 - 1.0 K/uL   Eosinophils Relative 2 0 - 5 %   Eosinophils Absolute 0.2 0.0 - 0.7 K/uL   Basophils Relative 1 0 - 1 %   Basophils Absolute 0.1 0.0 - 0.1 K/uL  Comprehensive metabolic panel     Status: Abnormal   Collection Time: 07/02/14  6:13 AM  Result Value Ref Range   Sodium 131 (L) 135 - 145 mmol/L   Potassium 3.8 3.5 - 5.1 mmol/L   Chloride 102 96 - 112 mmol/L   CO2 19 19 - 32 mmol/L   Glucose, Bld 96 70 - 99 mg/dL   BUN 7 6 - 23 mg/dL   Creatinine, Ser 0.58 0.50 - 1.35 mg/dL   Calcium 8.1 (L) 8.4 - 10.5 mg/dL   Total Protein 6.0 6.0 - 8.3 g/dL   Albumin 3.1 (L) 3.5 - 5.2 g/dL   AST 17 0 - 37 U/L   ALT 16 0 - 53 U/L   Alkaline Phosphatase 127 (H) 39 - 117 U/L   Total  Bilirubin 0.5 0.3 - 1.2 mg/dL   GFR calc non Af Amer >90 >90 mL/min   GFR calc Af Amer >90 >90 mL/min    Comment: (NOTE) The eGFR has been calculated using the CKD EPI equation. This calculation has not been validated in all clinical situations. eGFR's persistently <90 mL/min signify possible Chronic Kidney Disease.    Anion gap 10 5 - 15  Troponin I     Status: None   Collection Time: 07/02/14  6:13 AM  Result Value Ref Range   Troponin I <0.03 <0.031 ng/mL    Comment:        NO INDICATION OF MYOCARDIAL INJURY.     Dg Chest Port 1 View  07/02/2014   CLINICAL DATA:  Preop broken hip.  Previous heart surgery.  EXAM: PORTABLE CHEST - 1 VIEW  COMPARISON:  06/14/2014  FINDINGS: Postoperative change in the mediastinum. Normal heart size and pulmonary vascularity. No focal airspace disease or consolidation in the lungs. No blunting of costophrenic angles. No pneumothorax. Mediastinal contours appear intact. Postoperative changes in the right shoulder. Degenerative changes in the shoulders and spine.  IMPRESSION: No active disease.   Electronically Signed   By: Lucienne Capers M.D.   On: 07/02/2014 04:50   Dg Hip Unilat  With Pelvis 2-3 Views Left  07/02/2014   CLINICAL DATA:  Patient fell on 06/20/2014 wall being discharged from [facility], injuring the left hip. Pain fell again this morning landing on the left hip. Bruising the lateral side of the left hip.  EXAM: DG HIP W/ PELVIS 2-3V*L*  COMPARISON:  Abdomen 12/10/2013  FINDINGS: Linear lucencies in the intertrochanteric region of the left hip consistent with nondisplaced fractures. No dislocation of the left hip. Pelvis appears intact. Degenerative changes in the lower lumbar spine and hips. Vascular calcifications.  IMPRESSION: Linear lucencies in the intertrochanteric region of the left hip consistent with nondisplaced acute posttraumatic fractures.   Electronically Signed   By: Lucienne Capers M.D.   On: 07/02/2014 03:44   Dg Femur Min 2 Views Left  07/02/2014   CLINICAL DATA:  Intertrochanteric fracture LEFT femur, LEFT hip pain post fall on 06/20/2014  EXAM: DG FEMUR 2+V*L*  COMPARISON:  07/02/2014 LEFT hip radiographs  FINDINGS: Osseous demineralization.  LEFT hip and SI joint spaces preserved.  Joint space narrowing with spur formation at LEFT knee, greatest at lateral compartment.  Nondisplaced intertrochanteric fracture LEFT femur.  No additional fracture, dislocation, or bone destruction.  IMPRESSION: Nondisplaced intertrochanteric fracture LEFT femur.  Osseous demineralization.  Degenerative changes LEFT knee.   Electronically Signed   By: Lavonia Dana M.D.   On: 07/02/2014 08:05    ROS: as in admission H&P,   Physical Exam:  Pt alert and oriented but in severe pain.  He is NVI to light touch and has good distal pulses. Good DF/PF toes but any attempts at further exam produce severe pain and spasm to the left hip.   Vitals Temp:  [98.1 F (36.7 C)-98.3 F (36.8 C)] 98.2 F (36.8 C) (01/27 0658) Pulse Rate:  [63-79] 63 (01/27 0658) Resp:  [15-20] 16 (01/27 0726) BP: (81-138)/(56-89) 138/77 mmHg (01/27 0726) SpO2:  [96 %-100 %] 98 %  (01/27 0726) There is no weight on file to calculate BMI.  Assessment/Plan: Impression: Left nondisplaced intertrochanteric hip fracture   Treatment: will keep NPO, additional meds ordered for pain.  Plan will be to perform ORIF with gamma nail later today with Dr. Rod Can OP permit  ordered and risks benefits discussed with patient who agrees.  Etter Royall ,PA-C 07/02/2014, 8:11 AM

## 2014-07-02 NOTE — ED Notes (Signed)
Rizwan aware of pt unchanged pain status. Per Rizwan given 2 mg morphine IV and 2 percocet tablets at present time.

## 2014-07-02 NOTE — Discharge Instructions (Addendum)
°  Dr. Rod Can [facility] Specialist [facility] [address] [place], [place] [address] [phone]   POSTOPERATIVE DIRECTIONS Pick up stool softner and laxative for home use following surgery while on pain medications. Do not submerge incision under water. Please use good hand washing techniques while changing dressing each day. May shower starting three days after surgery. Please use a clean towel to pat the incision dry following showers. Continue to use ice for pain and swelling after surgery. Do not use any lotions or creams on the incision until instructed by your surgeon. Take Aspirin 325 mg twice daily with meals for 4 total weeks for blood clot prevention.

## 2014-07-02 NOTE — Progress Notes (Addendum)
[facility] Progress Note   [name] Pace:[id number] DOB: February 02, 1951 DOA: 07/02/2014 PCP: PROVIDER NOT IN SYSTEM  Brief narrative: [name] Pace is a 64 y.o. male of the past medical history of tobacco abuse, alcohol use, stroke, hyperlipidemia, coronary artery disease status post CABG who presents after a mechanical fall and is found to have a left hip fracture.   Subjective: Complains of pain in his left hip and pain in his back from laying in the stretcher.  Assessment/Plan: Principal Problem:   Hip fracture, left -Management per ortho-moderate risk for surgery- plans for ORIF with gamma nail later today  Active Problems:   Alcohol abuse -He states he only drinks intermittently-hopefully should not go into withdrawal but will continue to follow him with a see was scale    CAD (coronary artery disease) -continue Plavix- ortho to decide if this needs to be held for the procedure    Tobacco abuse -Consult to quit-nicotine patch ordered    History of stroke -Plavix -No deficits    Code Status: Full code Family Communication:  Disposition Plan: To be determined DVT prophylaxis: Heparin  Consultants: Ortho  Procedures:   Antibiotics: Anti-infectives    Start     Dose/Rate Route Frequency Ordered Stop   07/02/14 1500  vancomycin (VANCOCIN) IVPB 1000 mg/200 mL premix    Comments:  Complete IV infusion 30-60 minutes before surgical procedure   1,000 mg200 mL/hr over 60 Minutes Intravenous  Once 07/02/14 1338     07/02/14 1100  ceFAZolin (ANCEF) IVPB 2 g/50 mL premix     2 g100 mL/hr over 30 Minutes Intravenous On call to O.R. 07/02/14 1046 07/03/14 0559         Objective: There were no vitals filed for this visit.  Intake/Output Summary (Last 24 hours) at 07/02/14 1502 Last data filed at 07/02/14 0943  Gross per 24 hour  Intake      0 ml  Output    500 ml  Net   -500 ml     Vitals Filed Vitals:   07/02/14 1017 07/02/14 1122 07/02/14 1341  07/02/14 1435  BP: 123/67 147/64 109/92 122/57  Pulse: 66 68 74 70  Temp:   98.5 F (36.9 C)   TempSrc:   Oral   Resp: 19 19 18 18   SpO2: 95% 97% 100% 95%    Exam: General: Awake alert and oriented 3, No acute respiratory distress Lungs: Clear to auscultation bilaterally without wheezes or crackles Cardiovascular: Regular rate and rhythm without murmur gallop or rub normal S1 and S2 Abdomen: Nontender, nondistended, soft, bowel sounds positive, no rebound, no ascites, no appreciable mass Extremities: No significant cyanosis, clubbing, or edema bilateral lower extremities  Data Reviewed: Basic Metabolic Panel:  Recent Labs Lab 07/02/14 0613  NA 131*  K 3.8  CL 102  CO2 19  GLUCOSE 96  BUN 7  CREATININE 0.58  CALCIUM 8.1*   Liver Function Tests:  Recent Labs Lab 07/02/14 0613  AST 17  ALT 16  ALKPHOS 127*  BILITOT 0.5  PROT 6.0  ALBUMIN 3.1*   No results for input(s): LIPASE, AMYLASE in the last 168 hours. No results for input(s): AMMONIA in the last 168 hours. CBC:  Recent Labs Lab 07/02/14 0613  WBC 10.8*  NEUTROABS 8.2*  HGB 12.9*  HCT 36.8*  MCV 95.1  PLT 360   Cardiac Enzymes:  Recent Labs Lab 07/02/14 0613  TROPONINI <0.03   BNP (last 3 results) No results for input(s): PROBNP  in the last 8760 hours. CBG:  Recent Labs Lab 07/02/14 0818  GLUCAP 73    No results found for this or any previous visit (from the past 240 hour(s)).   Studies:  Recent x-ray studies have been reviewed in detail by the Attending Physician  Scheduled Meds:  Scheduled Meds: . sodium chloride   Intravenous STAT  . ALPRAZolam  1 mg Oral TID  . atorvastatin  40 mg Oral Daily  .  ceFAZolin (ANCEF) IV  2 g Intravenous On Call to OR  . chlorhexidine  60 mL Topical Once  . [START ON 07/03/2014] clopidogrel  75 mg Oral Daily  . folic acid  1 mg Oral Daily  . heparin  5,000 Units Subcutaneous 3 times per day  . multivitamin with minerals  1 tablet Oral Daily   . nicotine  21 mg Transdermal Daily  . pantoprazole  40 mg Oral Daily  . sodium chloride  3 mL Intravenous Q12H  . thiamine  100 mg Oral Daily   Or  . thiamine  100 mg Intravenous Daily  . vancomycin  1,000 mg Intravenous Once   Continuous Infusions:   Time spent on care of this patient: 35 minutes   [place], MD 07/02/2014, 3:02 PM  LOS: 0 days   [facility] Office  [phone] Pager - Text Page per [email or web address]  If 7PM-7AM, please contact night-coverage [email or web address]

## 2014-07-02 NOTE — Transfer of Care (Signed)
Immediate Anesthesia Transfer of Care Note  Patient: [name] Pace  Procedure(s) Performed: Procedure(s): INTRAMEDULLARY (IM) NAIL FEMORAL (Left)  Patient Location: PACU  Anesthesia Type:General  Level of Consciousness: awake, alert , oriented, patient cooperative and responds to stimulation  Airway & Oxygen Therapy: Patient Spontanous Breathing and Patient connected to face mask oxygen  Post-op Assessment: Report given to PACU RN, Post -op Vital signs reviewed and stable and Patient moving all extremities  Post vital signs: Reviewed and stable  Last Vitals:  Filed Vitals:   07/02/14 1435  BP: 122/57  Pulse: 70  Temp:   Resp: 18    Complications: No apparent anesthesia complications

## 2014-07-02 NOTE — ED Notes (Signed)
Attempted an IV x 2 with being unsuccessful. Notified Clearnce Sorrel RN to attempt an IV. Pt is in x-ray now.

## 2014-07-02 NOTE — ED Provider Notes (Signed)
CSN: [id number]     Arrival date & time 07/02/14  0200 History   First MD Initiated Contact with Patient 07/02/14 0207     Chief Complaint  Patient presents with  . Fall     (Consider location/radiation/quality/duration/timing/severity/associated sxs/prior Treatment) HPI Patient has a history of chronic arthritic pain for which she is on "patches". States he fell this evening from a standing position landing on his left hip. He complains of left hip pain currently. He has difficulty ambulating due to the pain. No focal weakness or numbness. Denies hitting head, loss of consciousness or neck pain. Does admit to drinking "several beers" prior to fall. Past Medical History  Diagnosis Date  . High cholesterol   . Coronary artery disease   . Alcohol abuse   . Stroke   . Anxiety   . Prostate cancer     per patient diagnosed in early 70's  . Breast cancer Left breast    Per patient diagnosed in early 25's.    Past Surgical History  Procedure Laterality Date  . Coronary artery bypass graft    . Carotid stent     Family History  Problem Relation Age of Onset  . Dementia Mother    History  Substance Use Topics  . Smoking status: Current Every Day Smoker -- 1.00 packs/day for 42 years    Types: Cigarettes  . Smokeless tobacco: Never Used  . Alcohol Use: Yes     Comment: 4-5 beers a day    Review of Systems  Constitutional: Negative for fever and chills.  Respiratory: Negative for cough and shortness of breath.   Cardiovascular: Negative for leg swelling.  Gastrointestinal: Negative for nausea, vomiting and abdominal pain.  Musculoskeletal: Positive for myalgias and arthralgias. Negative for back pain, neck pain and neck stiffness.  Skin: Negative for rash and wound.  Neurological: Negative for dizziness, syncope, weakness, light-headedness, numbness and headaches.  All other systems reviewed and are negative.     Allergies  Penicillins  Home Medications   Prior to  Admission medications   Medication Sig Start Date End Date Taking? Authorizing Provider  ALPRAZolam Duanne Moron) 1 MG tablet Take 1 mg by mouth 3 (three) times daily.  11/19/13  Yes Historical Provider, MD  atorvastatin (LIPITOR) 40 MG tablet Take 40 mg by mouth daily.   Yes Historical Provider, MD  BUTRANS 15 MCG/HR PTWK Place 1 patch onto the skin every 7 (seven) days. 06/23/14  Yes Historical Provider, MD  clopidogrel (PLAVIX) 75 MG tablet Take 75 mg by mouth daily.   Yes Historical Provider, MD  albuterol (PROVENTIL HFA;VENTOLIN HFA) 108 (90 BASE) MCG/ACT inhaler Inhale 2 puffs into the lungs every 6 (six) hours as needed for wheezing or shortness of breath. 2 puffs in lungs 3 times daily x 3 days then as needed. Patient not taking: Reported on 07/02/2014 06/18/14   Eugenie Filler, MD  dextromethorphan-guaiFENesin [facility] DM) 30-600 MG per 12 hr tablet Take 2 tablets by mouth 2 (two) times daily. Take for 4 days then stop. Patient not taking: Reported on 07/02/2014 06/18/14   Eugenie Filler, MD  feeding supplement, ENSURE COMPLETE, (ENSURE COMPLETE) LIQD Take 237 mLs by mouth 2 (two) times daily between meals. Patient not taking: Reported on 07/02/2014 06/18/14   Eugenie Filler, MD  folic acid (FOLVITE) 1 MG tablet Take 1 tablet (1 mg total) by mouth daily. Patient not taking: Reported on 07/02/2014 06/18/14   Eugenie Filler, MD  levofloxacin [facility]) 750  MG tablet Take 1 tablet (750 mg total) by mouth daily at 8 pm. Take for 4 days then stop. Patient not taking: Reported on 07/02/2014 06/19/14   Eugenie Filler, MD  nicotine (NICODERM CQ - DOSED IN MG/24 HOURS) 21 mg/24hr patch Place 1 patch (21 mg total) onto the skin daily. Patient not taking: Reported on 07/02/2014 06/18/14   Eugenie Filler, MD  omeprazole (PRILOSEC) 40 MG capsule Take 1 capsule (40 mg total) by mouth daily. Patient not taking: Reported on 07/02/2014 12/10/13   Julianne Rice, MD  ondansetron (ZOFRAN ODT) 4 MG  disintegrating tablet 4mg  ODT q4 hours prn nausea/vomit Patient not taking: Reported on 07/02/2014 12/10/13   Julianne Rice, MD  thiamine (VITAMIN B-1) 100 MG tablet Take 1 tablet (100 mg total) by mouth daily. Patient not taking: Reported on 07/02/2014 06/18/14   Eugenie Filler, MD   BP 114/65 mmHg  Pulse 63  Temp(Src) 98.1 F (36.7 C) (Oral)  Resp 20  SpO2 97% Physical Exam  Constitutional: He is oriented to person, place, and time. He appears well-developed and well-nourished. No distress.  HENT:  Head: Normocephalic and atraumatic.  Mouth/Throat: Oropharynx is clear and moist.  Eyes: EOM are normal. Pupils are equal, round, and reactive to light.  Neck: Normal range of motion. Neck supple.  Cardiovascular: Normal rate and regular rhythm.   Pulmonary/Chest: Effort normal and breath sounds normal. No respiratory distress. He has no wheezes. He has no rales. He exhibits no tenderness.  Abdominal: Soft. Bowel sounds are normal. He exhibits no distension and no mass. There is no tenderness. There is no rebound and no guarding.  Musculoskeletal: He exhibits tenderness. He exhibits no edema.  Patient with tenderness to palpation over the lateral left hip. There is subacute bruising on the medial surface of the left thigh. There is no shortening of the lower extremity. Distal pulses are intact. Full range of motion of the left knee. Decreased range of motion of the left hip due to pain.  Neurological: He is alert and oriented to person, place, and time.  5/5 motor in all extremities. Sensation is fully intact.  Skin: Skin is warm and dry. No rash noted. No erythema.  Psychiatric: He has a normal mood and affect. His behavior is normal.  Nursing note and vitals reviewed.   ED Course  Procedures (including critical care time) Labs Review Labs Reviewed  CBC WITH DIFFERENTIAL/PLATELET - Abnormal; Notable for the following:    WBC 10.8 (*)    RBC 3.87 (*)    Hemoglobin 12.9 (*)    HCT  36.8 (*)    Neutro Abs 8.2 (*)    All other components within normal limits  COMPREHENSIVE METABOLIC PANEL  TROPONIN I    Imaging Review Dg Chest Port 1 View  07/02/2014   CLINICAL DATA:  Preop broken hip.  Previous heart surgery.  EXAM: PORTABLE CHEST - 1 VIEW  COMPARISON:  06/14/2014  FINDINGS: Postoperative change in the mediastinum. Normal heart size and pulmonary vascularity. No focal airspace disease or consolidation in the lungs. No blunting of costophrenic angles. No pneumothorax. Mediastinal contours appear intact. Postoperative changes in the right shoulder. Degenerative changes in the shoulders and spine.  IMPRESSION: No active disease.   Electronically Signed   By: Lucienne Capers M.D.   On: 07/02/2014 04:50   Dg Hip Unilat With Pelvis 2-3 Views Left  07/02/2014   CLINICAL DATA:  Patient fell on 06/20/2014 wall being discharged from [facility],  injuring the left hip. Pain fell again this morning landing on the left hip. Bruising the lateral side of the left hip.  EXAM: DG HIP W/ PELVIS 2-3V*L*  COMPARISON:  Abdomen 12/10/2013  FINDINGS: Linear lucencies in the intertrochanteric region of the left hip consistent with nondisplaced fractures. No dislocation of the left hip. Pelvis appears intact. Degenerative changes in the lower lumbar spine and hips. Vascular calcifications.  IMPRESSION: Linear lucencies in the intertrochanteric region of the left hip consistent with nondisplaced acute posttraumatic fractures.   Electronically Signed   By: Lucienne Capers M.D.   On: 07/02/2014 03:44     EKG Interpretation None      MDM   Final diagnoses:  Hip pain  Preop examination  Closed left hip fracture, initial encounter    Discussed with Dr. Onnie Graham who will see patient in the emergency department. Advises keeping the patient nothing by mouth. Dr. Blaine Hamper will see patient and admit. Mild hypotension in the ED resolved with IV fluids. Likely due to pain medication.     Julianne Rice, MD 07/02/14 [phone]

## 2014-07-02 NOTE — Progress Notes (Signed)
Pt states his pcp is takia starkes at [facility] [place] [phone] Pt states he has an appointment with pcp on 07/14/14.  Reports not seeing pcp after discharge on 06/18/14 States he was followed by [facility] and the [facility] was scheduled to see him today at his home. Cm contacted Cyril Mourning of [facility] at 1049 to discuss pt admission and pt will be followed by [facility] for d/c needs

## 2014-07-03 DIAGNOSIS — S72142S Displaced intertrochanteric fracture of left femur, sequela: Secondary | ICD-10-CM

## 2014-07-03 LAB — COMPREHENSIVE METABOLIC PANEL
ALT: 15 U/L (ref 0–53)
AST: 26 U/L (ref 0–37)
Albumin: 2.6 g/dL — ABNORMAL LOW (ref 3.5–5.2)
Alkaline Phosphatase: 122 U/L — ABNORMAL HIGH (ref 39–117)
Anion gap: 6 (ref 5–15)
BILIRUBIN TOTAL: 0.6 mg/dL (ref 0.3–1.2)
BUN: 7 mg/dL (ref 6–23)
CO2: 24 mmol/L (ref 19–32)
Calcium: 7.6 mg/dL — ABNORMAL LOW (ref 8.4–10.5)
Chloride: 101 mmol/L (ref 96–112)
Creatinine, Ser: 0.68 mg/dL (ref 0.50–1.35)
GFR calc Af Amer: >90 mL/min (ref >90)
GFR calc non Af Amer: >90 mL/min (ref >90)
Glucose, Bld: 137 mg/dL — ABNORMAL HIGH (ref 70–99)
Potassium: 4.2 mmol/L (ref 3.5–5.1)
SODIUM: 131 mmol/L — AB (ref 135–145)
TOTAL PROTEIN: 5.5 g/dL — AB (ref 6.0–8.3)

## 2014-07-03 LAB — CBC
HCT: 38.8 % — ABNORMAL LOW (ref 39.0–52.0)
Hemoglobin: 12.9 g/dL — ABNORMAL LOW (ref 13.0–17.0)
MCH: 32.7 pg (ref 26.0–34.0)
MCHC: 33.2 g/dL (ref 30.0–36.0)
MCV: 98.5 fL (ref 78.0–100.0)
Platelets: 318 10*3/uL (ref 150–400)
RBC: 3.94 MIL/uL — AB (ref 4.22–5.81)
RDW: 12.8 % (ref 11.5–15.5)
WBC: 7 10*3/uL (ref 4.0–10.5)

## 2014-07-03 LAB — GLUCOSE, CAPILLARY
GLUCOSE-CAPILLARY: 129 mg/dL — AB (ref 70–99)
GLUCOSE-CAPILLARY: 97 mg/dL (ref 70–99)

## 2014-07-03 MED ORDER — ASPIRIN EC 325 MG PO TBEC
325.0000 mg | DELAYED_RELEASE_TABLET | Freq: Two times a day (BID) | ORAL | Status: DC
  Administered 2014-07-03 – 2014-07-04 (×2): 325 mg via ORAL
  Filled 2014-07-03 (×4): qty 1

## 2014-07-03 MED ORDER — HYDROCODONE-ACETAMINOPHEN 5-325 MG PO TABS: 1.0000 | ORAL_TABLET | Freq: Four times a day (QID) | ORAL | Status: DC | PRN

## 2014-07-03 MED ORDER — ASPIRIN 325 MG PO TBEC: 325.0000 mg | DELAYED_RELEASE_TABLET | Freq: Two times a day (BID) | ORAL | Status: DC

## 2014-07-03 NOTE — Progress Notes (Addendum)
[facility] Progress Note   NOA CONSTANTE ONG:[id number] DOB: [birth year] DOA: 07/02/2014 PCP: PROVIDER NOT IN SYSTEM  Brief narrative: [name] Pace is a 64 y.o. male of the past medical history of tobacco abuse, alcohol use, stroke, hyperlipidemia, coronary artery disease status post CABG who presents after a mechanical fall and is found to have a left hip fracture.   Subjective: Left hip pain improved.   Assessment/Plan: Principal Problem:   Hip fracture, left -Management per ortho- - s/p ORIF with gamma nail 1/27  Active Problems:   Alcohol abuse -He states he only drinks intermittently - no withdrawal symptoms noted    CAD (coronary artery disease) -continue Plavix-     Tobacco abuse -Consult to quit-nicotine patch ordered    History of stroke -Plavix -No deficits   Code Status: Full code Family Communication:  Disposition Plan: To be determined DVT prophylaxis: Heparin  Consultants: Ortho  Procedures:   Antibiotics: Anti-infectives    Start     Dose/Rate Route Frequency Ordered Stop   07/03/14 0400  vancomycin (VANCOCIN) IVPB 1000 mg/200 mL premix     1,000 mg200 mL/hr over 60 Minutes Intravenous Every 12 hours 07/02/14 2126 07/03/14 0521   07/02/14 1700  clindamycin (CLEOCIN) IVPB 900 mg     900 mg100 mL/hr over 30 Minutes Intravenous  Once 07/02/14 1645 07/02/14 1730   07/02/14 1511  vancomycin (VANCOCIN) IVPB 1000 mg/200 mL premix     1,000 mg200 mL/hr over 60 Minutes Intravenous 60 min pre-op 07/02/14 1511 07/02/14 1725   07/02/14 1500  vancomycin (VANCOCIN) IVPB 1000 mg/200 mL premix  Status:  Discontinued    Comments:  Complete IV infusion 30-60 minutes before surgical procedure   1,000 mg200 mL/hr over 60 Minutes Intravenous  Once 07/02/14 1338 07/02/14 1508   07/02/14 1100  ceFAZolin (ANCEF) IVPB 2 g/50 mL premix  Status:  Discontinued     2 g100 mL/hr over 30 Minutes Intravenous On call to O.R. 07/02/14 1046 07/02/14 1645      Objective: There were no vitals filed for this visit.  Intake/Output Summary (Last 24 hours) at 07/03/14 1603 Last data filed at 07/03/14 1333  Gross per 24 hour  Intake   2720 ml  Output   1425 ml  Net   1295 ml     Vitals Filed Vitals:   07/03/14 0606 07/03/14 0731 07/03/14 1200 07/03/14 1329  BP: 96/53   100/49  Pulse: 74   63  Temp: 97.7 F (36.5 C)   98.1 F (36.7 C)  TempSrc: Oral   Oral  Resp: 18 20 20 20   SpO2: 98% 98% 95% 98%    Exam: General: Awake alert and oriented 3, No acute respiratory distress Lungs: Clear to auscultation bilaterally without wheezes or crackles Cardiovascular: Regular rate and rhythm without murmur gallop or rub normal S1 and S2 Abdomen: Nontender, nondistended, soft, bowel sounds positive, no rebound, no ascites, no appreciable mass Extremities: No significant cyanosis, clubbing, or edema bilateral lower extremities  Data Reviewed: Basic Metabolic Panel:  Recent Labs Lab 07/02/14 0613 07/03/14 0912  NA 131* 131*  K 3.8 4.2  CL 102 101  CO2 19 24  GLUCOSE 96 137*  BUN 7 7  CREATININE 0.58 0.68  CALCIUM 8.1* 7.6*   Liver Function Tests:  Recent Labs Lab 07/02/14 0613 07/03/14 0912  AST 17 26  ALT 16 15  ALKPHOS 127* 122*  BILITOT 0.5 0.6  PROT 6.0 5.5*  ALBUMIN 3.1* 2.6*   No  results for input(s): LIPASE, AMYLASE in the last 168 hours. No results for input(s): AMMONIA in the last 168 hours. CBC:  Recent Labs Lab 07/02/14 0613 07/03/14 0912  WBC 10.8* 7.0  NEUTROABS 8.2*  --   HGB 12.9* 12.9*  HCT 36.8* 38.8*  MCV 95.1 98.5  PLT 360 318   Cardiac Enzymes:  Recent Labs Lab 07/02/14 0613  TROPONINI <0.03   BNP (last 3 results) No results for input(s): PROBNP in the last 8760 hours. CBG:  Recent Labs Lab 07/02/14 0818 07/02/14 1634 07/02/14 1808 07/03/14 0728  GLUCAP 73 66* 129* 97    Recent Results (from the past 240 hour(s))  MRSA PCR Screening     Status: None   Collection Time:  07/02/14  3:56 PM  Result Value Ref Range Status   MRSA by PCR NEGATIVE NEGATIVE Final    Comment:        The GeneXpert MRSA Assay (FDA approved for NASAL specimens only), is one component of a comprehensive MRSA colonization surveillance program. It is not intended to diagnose MRSA infection nor to guide or monitor treatment for MRSA infections.      Studies:  Recent x-ray studies have been reviewed in detail by the Attending Physician  Scheduled Meds:  Scheduled Meds: . ALPRAZolam  1 mg Oral TID  . aspirin EC  325 mg Oral BID WC  . atorvastatin  40 mg Oral Daily  . clopidogrel  75 mg Oral Daily  . folic acid  1 mg Oral Daily  . multivitamin with minerals  1 tablet Oral Daily  . nicotine  21 mg Transdermal Daily  . pantoprazole  40 mg Oral Daily  . sodium chloride  3 mL Intravenous Q12H  . thiamine  100 mg Oral Daily   Or  . thiamine  100 mg Intravenous Daily   Continuous Infusions:   Time spent on care of this patient: 35 minutes   Jerome, MD 07/03/2014, 4:03 PM  LOS: 1 day   [facility] Office  [phone] Pager - Text Page per [email or web address]  If 7PM-7AM, please contact night-coverage [email or web address]

## 2014-07-03 NOTE — Evaluation (Signed)
Physical Therapy Evaluation Patient Details Name: [name] Pace MRN: [id number] DOB: 07-08-1950 Today's Date: 07/03/2014   History of Present Illness  [name] Pace is a 64 y.o. male with past medical history of tobacco abuse, alcohol abuse, mild stroke without deficit, hyperlipidemia, GERD, coronary artery disease (s/p of CABG), remote history of breast cancer, remote history of prostate cancer, who presents with left hip pain after fall and now s/p IM nail  Clinical Impression  Pt admitted with above diagnosis. Pt currently with functional limitations due to the deficits listed below (see PT Problem List).  Pt will benefit from skilled PT to increase their independence and safety with mobility to allow discharge to the venue listed below.       Follow Up Recommendations SNF    Equipment Recommendations  Rolling walker with 5" wheels    Recommendations for Other Services       Precautions / Restrictions Precautions Precautions: Fall Restrictions Weight Bearing Restrictions: Yes LLE Weight Bearing: Weight bearing as tolerated      Mobility  Bed Mobility Overal bed mobility: Needs Assistance Bed Mobility: Sit to Supine     Supine to sit: Min assist Sit to supine: Min assist   General bed mobility comments: for LLE only  Transfers Overall transfer level: Needs assistance Equipment used: Rolling walker (2 wheeled) Transfers: Sit to/from Stand Sit to Stand: Mod assist         General transfer comment: assist to rise from lower chair surface, verbal cues for hand placement  Ambulation/Gait Ambulation/Gait assistance: Min assist Ambulation Distance (Feet): 30 Feet Assistive device: Rolling walker (2 wheeled) Gait Pattern/deviations: Step-to pattern;Antalgic;Narrow base of support     General Gait Details: verbal cues for sequence, technique, RW distance, posture; pt limiting WBing due to pain - more TDWB observed  Stairs            Wheelchair Mobility     Modified Rankin (Stroke Patients Only)       Balance Overall balance assessment: Needs assistance Sitting-balance support: Feet supported;No upper extremity supported Sitting balance-Leahy Scale: Fair     Standing balance support: Bilateral upper extremity supported Standing balance-Leahy Scale: Poor                               Pertinent Vitals/Pain Pain Assessment: 0-10 Pain Score: 7  Pain Location: L hip Pain Descriptors / Indicators: Aching;Sore Pain Intervention(s): Limited activity within patient's tolerance;Monitored during session;Repositioned;Ice applied (RN into room with meds end of session)    Home Living Family/patient expects to be discharged to:: Skilled nursing facility Living Arrangements: Spouse/significant other Available Help at Discharge: Friend(s) (girlfriend) Type of Home: [place] Access: Ramped entrance (up to [place] and then 1 step into house)     Home Layout: One level Home Equipment: [place] - single point Additional Comments: pt reports he lives with his significant other, her child, and a roommate    Prior Function Level of Independence: Independent               Hand Dominance   Dominant Hand: Right    Extremity/Trunk Assessment   Upper Extremity Assessment: Overall WFL for tasks assessed           Lower Extremity Assessment: LLE deficits/detail   LLE Deficits / Details: decreased functional hip strength observed, limited by pain     Communication   Communication: No difficulties  Cognition Arousal/Alertness: Awake/alert Behavior During Therapy: [facility]  for tasks assessed/performed Overall Cognitive Status: Within Functional Limits for tasks assessed                      General Comments      Exercises        Assessment/Plan    PT Assessment Patient needs continued PT services  PT Diagnosis Difficulty walking;Acute pain   PT Problem List Decreased strength;Decreased activity  tolerance;Decreased mobility;Pain;Decreased knowledge of use of DME  PT Treatment Interventions DME instruction;Gait training;Functional mobility training;Therapeutic activities;Patient/family education;Therapeutic exercise;Balance training   PT Goals (Current goals can be found in the Care Plan section) Acute Rehab PT Goals Patient Stated Goal: to not fall again PT Goal Formulation: With patient Time For Goal Achievement: 07/10/14 Potential to Achieve Goals: Good    Frequency Min 3X/week   Barriers to discharge        Co-evaluation               End of Session Equipment Utilized During Treatment: Gait belt Activity Tolerance: Patient limited by pain Patient left: in bed;with call bell/phone within reach;with bed alarm set           Time: 4514-6047 PT Time Calculation (min) (ACUTE ONLY): 14 min   Charges:   PT Evaluation $Initial PT Evaluation Tier I: 1 Procedure     PT G Codes:        Mellisa Arshad,KATHrine E 07/03/2014, 11:57 AM Carmelia Bake, PT, DPT 07/03/2014 Pager: [phone]

## 2014-07-03 NOTE — Progress Notes (Signed)
CSW spoke with patient & provided SNF bed offers, patient expressed interest in [facility] SNF as his mother had been there before. Patient informed CSW that he would like to go home first before going to SNF to "take care of business". CSW confirmed with Suanne Marker at [facility] that his insurance - [facility] Medicare would not cover for SNF if he went home at discharge. Patient informed CSW that he would just go home at discharge, that his brother would take him home. RNCM, Cookie aware & will arrange for [facility] services.   No further CSW needs identified - CSW signing off.   Clinical Social Work Department CLINICAL SOCIAL WORK PLACEMENT NOTE 07/03/2014  Patient:  AIMAN, NOE  Account Number:  [id number] Admit date:  07/02/2014  Clinical Social Worker:  Renold Genta  Date/time:  07/03/2014 03:20 PM  Clinical Social Work is seeking post-discharge placement for this patient at the following level of care:   SKILLED NURSING   (*CSW will update this form in Epic as items are completed)   07/03/2014  Patient/family provided with [place] Department of Clinical Social Work's list of facilities offering this level of care within the geographic area requested by the patient (or if unable, by the patient's family).  07/03/2014  Patient/family informed of their freedom to choose among providers that offer the needed level of care, that participate in Medicare, Medicaid or managed care program needed by the patient, have an available bed and are willing to accept the patient.  07/03/2014  Patient/family informed of [facility]' ownership interest in [facility], as well as of the fact that they are under no obligation to receive care at this facility.  PASARR submitted to EDS on 07/03/2014 PASARR number received on 07/03/2014  FL2 transmitted to all facilities in geographic area requested by pt/family on  07/03/2014 FL2 transmitted to all facilities within larger geographic area on    Patient informed that his/her managed care company has contracts with or will negotiate with  certain facilities, including the following:     Patient/family informed of bed offers received:  07/03/2014 Patient chooses bed at  Physician recommends and patient chooses bed at    Patient to be transferred to  on   Patient to be transferred to facility by  Patient and family notified of transfer on  Name of family member notified:    The following physician request were entered in Epic:   Additional Comments:   Raynaldo Opitz, [place] Social Worker cell #: [phone]

## 2014-07-03 NOTE — Op Note (Signed)
NAME:  [name] Pace, [name] Pace NO.:  [id number]  MEDICAL RECORD NO.:  [id number]  LOCATION:  12                         FACILITY:  [facility]  PHYSICIAN:  Rod Can, MD     DATE OF BIRTH:  [birth year]  DATE OF PROCEDURE:  07/02/2014 DATE OF DISCHARGE:                              OPERATIVE REPORT   PREOPERATIVE DIAGNOSIS:  Left pertrochanteric femur fracture.  POSTOPERATIVE DIAGNOSIS:  Left pertrochanteric femur fracture.  PROCEDURE:  Cephalomedullary nail placement, left pertrochanteric femur fracture.  ANESTHESIA:  General.  IMPLANTS: 1. Stryker gamma 3 nail, 11 x 440 mm x 125 degrees. 2. 10.5 x 100 mm titanium lag screw. 3. 5.0 mm distal interlocking screw x1.  ESTIMATED BLOOD LOSS:  50 mL.  DRAINS:  None.  SPECIMENS:  None.  COMPLICATIONS:  None.  DISPOSITION:  Stable to PACU.  ANTIBIOTICS:  Vancomycin 1 g.  INDICATIONS:  The patient is a 64 year old male who tripped and had a ground level fall yesterday night.  He had hip pain and inability to bear weight.  He was brought to the emergency department at [facility] where x-rays revealed a pertrochanteric femur fracture.  He was admitted to the Hospitalist Service.  He underwent perioperative risk stratification, medical optimization.  Risks, benefits, alternatives of the above mentioned procedure were explained.  He elected to proceed.  DESCRIPTION OF PROCEDURE:  The patient was correctly identified in the preop holding area using 2 patient identifiers.  History, physical and consent were reviewed and found to be appropriate.  Surgical site was marked by myself.  The patient was taken to the operating room and general anesthesia was induced on his bed.  He was then transferred to the [facility] table.  The hip was prepped and draped in the normal sterile surgical fashion.  Time-out was called verifying site of surgery.  He did receive IV antibiotics within 60 minutes of beginning the procedure. I began  by using fluoroscopy to define his anatomy.  A 4 cm incision was made proximal to tip of the greater trochanter.  Under fluoroscopic guidance, I inserted a guidepin at the standard entry site for a trochanteric nail.  Entry reamer was then used.  I then placed the guide wire and the position was confirmed fluoroscopically down to the physeal scar in the knee.  I then measured the length of the nail and then sequentially reamed up to a 12.5 mm reamer.  The nail was then opened and placed over the guide wire.  Through a separate stab incision, cannula was placed for the lag screw.  The pin was measured, drilled, and then the screw was placed under fluoroscopic guidance.  Live fluoroscopy was used to confirm tip apex distance and determine that there was no subchondral penetration.  The jig was then removed.  I then used the perfect circle technique to place one distal interlocking screw.  Final fluoroscopy views were obtained to confirm fracture reduction and hardware placement.  The wounds were then copiously irrigated with saline.  The wounds were closed in layers with #1 Vicryl for the fascia, 2-0 Monocryl for the deep dermal layer, and 3-0 running Monocryl subcuticular stitch.  Glue was  applied to the skin.  Once the glue was dried, sterile dressings were applied.  Sponge, needle, and instrument counts were correct at the end of the case x2.  The patient was transferred to the bed and extubated.  He was taken to the PACU in stable condition.  There were no known complications.          ______________________________ Rod Can, MD     BS/MEDQ  D:  07/02/2014  T:  07/03/2014  Job:  [id number]

## 2014-07-03 NOTE — Progress Notes (Signed)
   Subjective:  Patient reports pain as mild to moderate.  No events O/N. Denies N/V/CP/SOB.  Objective:   VITALS:   Filed Vitals:   07/02/14 2244 07/02/14 2322 07/02/14 2347 07/03/14 0606  BP: 144/57 132/62 127/55 96/53  Pulse: 80 72 69 74  Temp: 97 F (36.1 C) 97.7 F (36.5 C) 98.1 F (36.7 C) 97.7 F (36.5 C)  TempSrc: Oral Oral Oral Oral  Resp: 18 18 16 18   SpO2: 100% 96% 100% 98%    ABD soft Sensation intact distally Intact pulses distally Dorsiflexion/Plantar flexion intact Incision: dressing C/D/I   Lab Results  Component Value Date   WBC 10.8* 07/02/2014   HGB 12.9* 07/02/2014   HCT 36.8* 07/02/2014   MCV 95.1 07/02/2014   PLT 360 07/02/2014   BMET    Component Value Date/Time   NA 131* 07/02/2014 0613   K 3.8 07/02/2014 0613   CL 102 07/02/2014 0613   CO2 19 07/02/2014 0613   GLUCOSE 96 07/02/2014 0613   BUN 7 07/02/2014 0613   CREATININE 0.58 07/02/2014 0613   CALCIUM 8.1* 07/02/2014 0613   GFRNONAA >90 07/02/2014 0613   GFRAA >90 07/02/2014 1610     Assessment/Plan: 1 Day Post-Op   Principal Problem:   Hip fracture, left Active Problems:   Alcohol abuse   CAD (coronary artery disease)   Tobacco abuse   History of stroke   Closed left hip fracture   Hip fracture   Preop examination   Intertrochanteric fracture of left femur   WBAT with walker Continue plavix Start ASA 325mg  PO BID x4 weeks Advance diet Up with therapy Dispo: pending PT/OT eval, likely home with [facility] PT   Antoine Fiallos, [name] 07/03/2014, 7:20 AM   Rod Can, MD Cell [phone]

## 2014-07-03 NOTE — Evaluation (Signed)
Occupational Therapy Evaluation Patient Details Name: [name] Pace MRN: [id number] DOB: [birth year] Today's Date: 07/03/2014    History of Present Illness [name] Pace is a 64 y.o. male with past medical history of tobacco abuse, alcohol abuse, mild stroke without deficit, hyperlipidemia, GERD, coronary artery disease (s/p of CABG), remote history of breast cancer, remote history of prostate cancer, who presents with left hip pain after fall and now s/p IM nail   Clinical Impression   This 64 yo male admitted and underwent above presents to acute OT with increased pain, decreased mobility, decreased balance, and decreased A/PROM of LLE all affecting his ability to care for himself independently as he was pta. He will benefit from one more session of acute OT with follow up [place].    Follow Up Recommendations  Home health OT    Equipment Recommendations  3 in 1 bedside comode       Precautions / Restrictions Precautions Precautions: Fall Restrictions Weight Bearing Restrictions: Yes LLE Weight Bearing: Weight bearing as tolerated      Mobility Bed Mobility Overal bed mobility: Needs Assistance Bed Mobility: Supine to Sit     Supine to sit: Min assist     General bed mobility comments: for LLE only  Transfers Overall transfer level: Needs assistance Equipment used: Rolling walker (2 wheeled) Transfers: Sit to/from Stand Sit to Stand: Min assist              Balance Overall balance assessment: Needs assistance Sitting-balance support: Feet supported;No upper extremity supported Sitting balance-Leahy Scale: Fair     Standing balance support: Bilateral upper extremity supported Standing balance-Leahy Scale: Poor                              ADL Overall ADL's : Needs assistance/impaired Eating/Feeding: Independent;Sitting   Grooming: Set up;Sitting   Upper Body Bathing: Set up;Sitting   Lower Body Bathing: Moderate assistance (with min A  sit<>stand)   Upper Body Dressing : Set up;Sitting   Lower Body Dressing: Maximal assistance (with min A sit<>stand)   Toilet Transfer: Minimal assistance;Ambulation;RW (min A bed (raised) to recliner)   Toileting- Clothing Manipulation and Hygiene: Minimal assistance (with min A sit<>stand)                         Pertinent Vitals/Pain Pain Assessment: 0-10 Pain Score: 2  Pain Location: left hip Pain Descriptors / Indicators: Aching Pain Intervention(s): Monitored during session;Premedicated before session     Hand Dominance Right   Extremity/Trunk Assessment Upper Extremity Assessment Upper Extremity Assessment: Overall WFL for tasks assessed           Communication Communication Communication: No difficulties   Cognition Arousal/Alertness: Awake/alert Behavior During Therapy: WFL for tasks assessed/performed Overall Cognitive Status: Within Functional Limits for tasks assessed                                Home Living Family/patient expects to be discharged to:: Private residence Living Arrangements: Spouse/significant other Available Help at Discharge: Friend(s) (girlfriend) Type of Home: House Home Access: Ramped entrance (up to porch and then 1 step into house)     Home Layout: One level     Bathroom Shower/Tub:  (pt states that he will sponge bathe)   Bathroom Toilet: Standard     Home Equipment: Cane - single point  Prior Functioning/Environment Level of Independence: Independent             OT Diagnosis: Generalized weakness;Acute pain   OT Problem List: Decreased range of motion;Impaired balance (sitting and/or standing);Pain;Decreased knowledge of use of DME or AE   OT Treatment/Interventions: Self-care/ADL training;Patient/family education;Balance training;DME and/or AE instruction    OT Goals(Current goals can be found in the care plan section) Acute Rehab OT Goals Patient Stated Goal: to not fall  again OT Goal Formulation: With patient Time For Goal Achievement: 07/10/14 Potential to Achieve Goals: Good  OT Frequency: Min 2X/week              End of Session Equipment Utilized During Treatment: Gait belt;Rolling walker Nurse Communication: Mobility status (NT)  Activity Tolerance: Patient tolerated treatment well Patient left: in chair;with call bell/phone within reach   Time: 0926-0951 OT Time Calculation (min): 25 min Charges:  OT General Charges $OT Visit: 1 Procedure OT Evaluation $Initial OT Evaluation Tier I: 1 Procedure OT Treatments $Self Care/Home Management : 8-22 mins  Almon Register [phone] 07/03/2014, 10:06 AM

## 2014-07-04 ENCOUNTER — Encounter (HOSPITAL_COMMUNITY): Payer: Self-pay | Admitting: Orthopedic Surgery

## 2014-07-04 LAB — GLUCOSE, CAPILLARY: GLUCOSE-CAPILLARY: 118 mg/dL — AB (ref 70–99)

## 2014-07-04 MED ORDER — HYDROCODONE-ACETAMINOPHEN 5-325 MG PO TABS: 1.0000 | ORAL_TABLET | Freq: Four times a day (QID) | ORAL | Status: DC | PRN

## 2014-07-04 MED ORDER — ASPIRIN 325 MG PO TBEC: 325.0000 mg | DELAYED_RELEASE_TABLET | Freq: Two times a day (BID) | ORAL | Status: DC

## 2014-07-04 NOTE — Progress Notes (Signed)
PT Cancellation Note  Patient Details Name: [name] Pace MRN: [id number] DOB: [birth year]   Cancelled Treatment:    Reason Eval/Treat Not Completed: Patient declined, no reason specified (declined due to fatigue from working with OT, reports d/c today)   Diasia Henken,KATHrine E 07/04/2014, 11:01 AM Carmelia Bake, PT, DPT 07/04/2014 Pager: [phone]

## 2014-07-04 NOTE — Progress Notes (Signed)
CARE MANAGEMENT NOTE 07/04/2014  Patient:  [name] Pace, [name] Pace   Account Number:  [id number]  Date Initiated:  07/03/2014  Documentation initiated by:  Karl Bales  Subjective/Objective Assessment:   Pt admitted with hip fx     Action/Plan:   from home   Anticipated DC Date:  07/04/2014   Anticipated DC Plan:  [place]  CM consult      [facility] Choice  [place]   Choice offered to / List presented to:  C-1 Patient   DME arranged  [facility]ie Moselle      DME agency  [place] arranged  HH-1 RN  [place].   Status of service:  In process, will continue to follow Medicare Important Message given?  NA - LOS <3 / Initial given by admissions (If response is "NO", the following Medicare IM given date fields will be blank) Date Medicare IM given:   Medicare IM given by:   Date Additional Medicare IM given:   Additional Medicare IM given by:    Discharge Disposition:  [place]  Per UR Regulation:  Reviewed for med. necessity/level of care/duration of stay  If discussed at [place] of Stay Meetings, dates discussed:    Comments:  07/03/14 MMcGibboney, RN, BSN Pt declined SNF, want to go home with [facility].  Pt selected [facility] for HHRN/PT/OT and DME RW.

## 2014-07-04 NOTE — Progress Notes (Signed)
[name] Pace  MRN: [id number] DOB/Age: 12/13/1950 64 y.o. Physician: Rada Hay Procedure: Procedure(s) (LRB): INTRAMEDULLARY (IM) NAIL FEMORAL (Left)     Subjective: Patient doing well overall and ready for DC to home. Has been independent with ambulation short distances. Home needs aranged  Vital Signs Temp:  [98.1 F (36.7 C)-98.3 F (36.8 C)] 98.2 F (36.8 C) (01/29 0813) Pulse Rate:  [63-78] 74 (01/29 0813) Resp:  [16-20] 18 (01/29 0813) BP: (100-128)/(49-63) 118/62 mmHg (01/29 0813) SpO2:  [96 %-100 %] 99 % (01/29 0813) Weight:  [69.627 kg (153 lb 8 oz)] 69.627 kg (153 lb 8 oz) (01/29 0539)  Lab Results  Recent Labs  07/02/14 0613 07/03/14 0912  WBC 10.8* 7.0  HGB 12.9* 12.9*  HCT 36.8* 38.8*  PLT 360 318   BMET  Recent Labs  07/02/14 0613 07/03/14 0912  NA 131* 131*  K 3.8 4.2  CL 102 101  CO2 19 24  GLUCOSE 96 137*  BUN 7 7  CREATININE 0.58 0.68  CALCIUM 8.1* 7.6*   INR  Date Value Ref Range Status  07/02/2014 0.96 0.00 - 1.49 Final     Exam Moving both extremities well. Thigh dressings clean and dry        Plan Discharge home to follow up as outpatient. Aspirin for DVT prophylaxis. Norco for pain. (Rx provided by Dr. Onalee Hua  [facility] for Dr.Kevin Supple 07/04/2014, 12:03 PM

## 2014-07-04 NOTE — Progress Notes (Signed)
Occupational Therapy Treatment Patient Details Name: [name] Pace MRN: [id number] DOB: June 11, 1950 Today's Date: 07/04/2014    History of present illness [name] Pace is a 64 y.o. male with past medical history of tobacco abuse, alcohol abuse, mild stroke without deficit, hyperlipidemia, GERD, coronary artery disease (s/p of CABG), remote history of breast cancer, remote history of prostate cancer, who presents with left hip pain after fall and now s/p IM nail   OT comments  Pt now plans home with girlfriend's assistance:  Needs reinforcement for sit to stand.  Also minA to stand--transitions are not smooth  Follow Up Recommendations  Home health OT;Supervision/Assistance - 24 hour    Equipment Recommendations  3 in 1 bedside comode (tub transfer bench, if covered by insurance)    Recommendations for Other Services      Precautions / Restrictions Precautions Precautions: Fall Restrictions Weight Bearing Restrictions: No LLE Weight Bearing: Weight bearing as tolerated       Mobility Bed Mobility           Sit to supine: Min assist   General bed mobility comments: for LLE only  Transfers   Equipment used: Rolling walker (2 wheeled) Transfers: Sit to/from Stand Sit to Stand: Min assist         General transfer comment: cues for UE/LE placement    Balance                                   ADL                       Lower Body Dressing: Minimal assistance;Sit to/from stand;With adaptive equipment   Toilet Transfer: Minimal assistance;Ambulation;BSC             General ADL Comments: pt was able to don pants without AE.  Educated on use of sock aide and he demonstrated use:  he does not have this--told him gift shop sells them.  Girlfriend will assist as home:  he states she is going through [place] tx.  Pt said a 3:1 was ordered and he believes someone is delivering a shower seat, which he says is covered by insurance.  Will need tub  transfer bench or sponge bathing      Vision                     Perception     Praxis      Cognition   Behavior During Therapy: WFL for tasks assessed/performed Overall Cognitive Status: Within Functional Limits for tasks assessed                       Extremity/Trunk Assessment               Exercises     Shoulder Instructions       General Comments      Pertinent Vitals/ Pain       Pain Score: 6  Pain Location: L hip Pain Descriptors / Indicators: Aching Pain Intervention(s): Limited activity within patient's tolerance;Monitored during session;Premedicated before session;Repositioned;Ice applied (pt limited weight on LLE)  Home Living                                          Prior Functioning/Environment  Frequency Min 2X/week     Progress Toward Goals  OT Goals(current goals can now be found in the care plan section)  Progress towards OT goals: Progressing toward goals     Plan      Co-evaluation                 End of Session     Activity Tolerance Patient tolerated treatment well   Patient Left in chair;with call bell/phone within reach;with chair alarm set   Nurse Communication          Time: 4695-0722 OT Time Calculation (min): 25 min  Charges: OT General Charges $OT Visit: 1 Procedure OT Treatments $Self Care/Home Management : 23-37 mins  Azion Centrella 07/04/2014, 12:24 PM  Lesle Chris, OTR/L [phone] 07/04/2014

## 2014-07-04 NOTE — Discharge Summary (Signed)
Physician Discharge Summary  [name] Pace QQP:[id number] DOB: 1951-03-18 DOA: 07/02/2014  PCP: PROVIDER NOT IN SYSTEM  Admit date: 07/02/2014 Discharge date: 07/04/2014  Time spent: 45 minutes  Recommendations for Outpatient Follow-up:  1. F/u with ortho in 2 wks   Discharge Condition: stable Diet recommendation: heart hearlthy  Discharge Diagnoses:  Principal Problem:   Hip fracture, left Active Problems:   Alcohol abuse   CAD (coronary artery disease)   Tobacco abuse   History of stroke   Closed left hip fracture   Hip fracture   Preop examination   Intertrochanteric fracture of left femur   History of present illness:  [name] Pace is a 64 y.o. male of the past medical history of tobacco abuse, alcohol use, stroke, hyperlipidemia, coronary artery disease status post CABG who presents after a mechanical fall and is found to have a left hip fracture. Please see H and P for further details.  Hospital Course:  Principal Problem:  Hip fracture, left - s/p ORIF with gamma nail 1/27 - Per PT eval, can go home with HHPT/ OT which has been ordered - ASA 325 BID x 4 wks for DVT prophylaxis  Active Problems:  Alcohol abuse -He states he only drinks intermittently - no withdrawal symptoms noted   CAD (coronary artery disease) -continue Plavix-    Tobacco abuse -Consult to quit-nicotine patch ordered   History of stroke -Plavix -No deficits   Procedures:  ORIF with gamma nail  Consultations:  Orthopedics   Discharge Exam: Filed Weights   07/04/14 0539  Weight: 69.627 kg (153 lb 8 oz)   Filed Vitals:   07/04/14 0813  BP: 118/62  Pulse: 74  Temp: 98.2 F (36.8 C)  Resp: 18    General: AAO x 3, no distress Cardiovascular: RRR, no murmurs  Respiratory: clear to auscultation bilaterally GI: soft, non-tender, non-distended, bowel sound positive  Discharge Instructions You were cared for by a hospitalist during your hospital stay. If you have any  questions about your discharge medications or the care you received while you were in the hospital after you are discharged, you can call the unit and asked to speak with the hospitalist on call if the hospitalist that took care of you is not available. Once you are discharged, your primary care physician will handle any further medical issues. Please note that NO REFILLS for any discharge medications will be authorized once you are discharged, as it is imperative that you return to your primary care physician (or establish a relationship with a primary care physician if you do not have one) for your aftercare needs so that they can reassess your need for medications and monitor your lab values.      Discharge Instructions    Call MD / Call 911    Complete by:  As directed   If you experience chest pain or shortness of breath, CALL 911 and be transported to the hospital emergency room.  If you develope a fever above 101 F, pus (white drainage) or increased drainage or redness at the wound, or calf pain, call your surgeon's office.     Constipation Prevention    Complete by:  As directed   Drink plenty of fluids.  Prune juice may be helpful.  You may use a stool softener, such as Colace (over the counter) 100 mg twice a day.  Use MiraLax (over the counter) for constipation as needed.     DO NOT drive, shower or take a tub  bath until instructed by your physician    Complete by:  As directed      Diet - low sodium heart healthy    Complete by:  As directed      Diet - low sodium heart healthy    Complete by:  As directed      Discharge wound care:    Complete by:  As directed   If you have a hip bandage, keep it clean and dry.  Change your bandage as instructed by your health care providers.  If your bandage has been discontinued, keep your incision clean and dry.  Pat dry after bathing.  DO NOT put lotion or powder on your incision.     Increase activity slowly as tolerated    Complete by:  As  directed      Increase activity slowly    Complete by:  As directed             Medication List    TAKE these medications        albuterol 108 (90 BASE) MCG/ACT inhaler  Commonly known as:  PROVENTIL HFA;VENTOLIN HFA  Inhale 2 puffs into the lungs every 6 (six) hours as needed for wheezing or shortness of breath. 2 puffs in lungs 3 times daily x 3 days then as needed.     ALPRAZolam 1 MG tablet  Commonly known as:  XANAX  Take 1 mg by mouth 3 (three) times daily.     aspirin 325 MG EC tablet  Take 1 tablet (325 mg total) by mouth 2 (two) times daily with a meal.     atorvastatin 40 MG tablet  Commonly known as:  LIPITOR  Take 40 mg by mouth daily.     BUTRANS 15 MCG/HR Ptwk  Generic drug:  Buprenorphine  Place 1 patch onto the skin every 7 (seven) days.     clopidogrel 75 MG tablet  Commonly known as:  PLAVIX  Take 75 mg by mouth daily.     dextromethorphan-guaiFENesin 30-600 MG per 12 hr tablet  Commonly known as:  MUCINEX DM  Take 2 tablets by mouth 2 (two) times daily. Take for 4 days then stop.     feeding supplement (ENSURE COMPLETE) Liqd  Take 237 mLs by mouth 2 (two) times daily between meals.     folic acid 1 MG tablet  Commonly known as:  FOLVITE  Take 1 tablet (1 mg total) by mouth daily.     HYDROcodone-acetaminophen 5-325 MG per tablet  Commonly known as:  NORCO/VICODIN  Take 1-2 tablets by mouth every 6 (six) hours as needed for moderate pain.     levofloxacin 750 MG tablet  Commonly known as:  LEVAQUIN  Take 1 tablet (750 mg total) by mouth daily at 8 pm. Take for 4 days then stop.     nicotine 21 mg/24hr patch  Commonly known as:  NICODERM CQ - dosed in mg/24 hours  Place 1 patch (21 mg total) onto the skin daily.     omeprazole 40 MG capsule  Commonly known as:  PRILOSEC  Take 1 capsule (40 mg total) by mouth daily.     ondansetron 4 MG disintegrating tablet  Commonly known as:  ZOFRAN ODT  4mg  ODT q4 hours prn nausea/vomit      thiamine 100 MG tablet  Commonly known as:  VITAMIN B-1  Take 1 tablet (100 mg total) by mouth daily.       Allergies  Allergen Reactions  .  Penicillins Swelling    throat   Follow-up Information    Follow up with [name], [name], MD. Call in 2 weeks.   Specialty:  Orthopedic Surgery   Why:  For wound re-check   Contact information:   [address] [id number] [phone]        The results of significant diagnostics from this hospitalization (including imaging, microbiology, ancillary and laboratory) are listed below for reference.    Significant Diagnostic Studies: Dg Chest 2 View  06/14/2014   CLINICAL DATA:  History of CAD.  Coronary bypass graft.  EXAM: CHEST  2 VIEW  COMPARISON:  Radiograph 12/10/2013  FINDINGS: Sternotomy wires overlie normal cardiac silhouette. There is new fine airspace disease in the left lower lobe. No pleural fluid. No pneumothorax.  IMPRESSION: New left lower lobe pneumonia.   Electronically Signed   By: Suzy Bouchard M.D.   On: 06/14/2014 19:44   Pelvis Portable  07/02/2014   CLINICAL DATA:  Subsequent encounter for left hip fracture  EXAM: PORTABLE PELVIS 1-2 VIEWS  COMPARISON:  Earlier the same day.  FINDINGS: Portable view at 1922 hrs does not include the lower pelvis apparent dynamic hip screw is seen in the left femoral neck but fixation hardware is incompletely visualized. Bones are diffusely demineralized.  IMPRESSION: Status post ORIF for left proximal femur fracture. Incomplete visualization of fixation hardware.   Electronically Signed   By: Misty Stanley M.D.   On: 07/02/2014 20:05   Dg Chest Port 1 View  07/02/2014   CLINICAL DATA:  Preop broken hip.  Previous heart surgery.  EXAM: PORTABLE CHEST - 1 VIEW  COMPARISON:  06/14/2014  FINDINGS: Postoperative change in the mediastinum. Normal heart size and pulmonary vascularity. No focal airspace disease or consolidation in the lungs. No blunting of  costophrenic angles. No pneumothorax. Mediastinal contours appear intact. Postoperative changes in the right shoulder. Degenerative changes in the shoulders and spine.  IMPRESSION: No active disease.   Electronically Signed   By: Lucienne Capers M.D.   On: 07/02/2014 04:50   Dg C-arm 1-60 Min-no Report  07/02/2014   CLINICAL DATA: left femur fx   C-ARM 1-60 MINUTES  Fluoroscopy was utilized by the requesting physician.  No radiographic  interpretation.    Dg Hip Unilat With Pelvis 2-3 Views Left  07/02/2014   CLINICAL DATA:  Patient fell on 06/20/2014 wall being discharged from [facility], injuring the left hip. Pain fell again this morning landing on the left hip. Bruising the lateral side of the left hip.  EXAM: DG HIP W/ PELVIS 2-3V*L*  COMPARISON:  Abdomen 12/10/2013  FINDINGS: Linear lucencies in the intertrochanteric region of the left hip consistent with nondisplaced fractures. No dislocation of the left hip. Pelvis appears intact. Degenerative changes in the lower lumbar spine and hips. Vascular calcifications.  IMPRESSION: Linear lucencies in the intertrochanteric region of the left hip consistent with nondisplaced acute posttraumatic fractures.   Electronically Signed   By: Lucienne Capers M.D.   On: 07/02/2014 03:44   Dg Femur Min 2 Views Left  07/02/2014   CLINICAL DATA:  Subsequent encounter for left hip fracture status post ORIF  EXAM: DG FEMUR 2+V*L*  COMPARISON:  Row no same day  FINDINGS: Two view portable exam at 1922 hrs shows long antegrade IM nail with interlocking hip screw in the femoral neck and a single distal interlocking screw in the femur. No evidence for immediate hardware complications. Gas in the overlying soft  tissues is compatible with the immediate postoperative state.  IMPRESSION: Status post ORIF for proximal femur fracture. No evidence for immediate hardware complications.   Electronically Signed   By: Misty Stanley M.D.   On: 07/02/2014 20:07   Dg Femur  Min 2 Views Left  07/02/2014   CLINICAL DATA:  Intertrochanteric left femur fracture  EXAM: DG FEMUR 2+V*L*  COMPARISON:  07/02/2014  FINDINGS: Interval intra medullary nail fixation of a intertrochanteric femur fracture. Distal locking screw appears normal. Compression screw normal.  IMPRESSION: No complication following ORIF left intertrochanteric femur fracture.   Electronically Signed   By: Suzy Bouchard M.D.   On: 07/02/2014 19:02   Dg Femur Min 2 Views Left  07/02/2014   CLINICAL DATA:  Intertrochanteric fracture LEFT femur, LEFT hip pain post fall on 06/20/2014  EXAM: DG FEMUR 2+V*L*  COMPARISON:  07/02/2014 LEFT hip radiographs  FINDINGS: Osseous demineralization.  LEFT hip and SI joint spaces preserved.  Joint space narrowing with spur formation at LEFT knee, greatest at lateral compartment.  Nondisplaced intertrochanteric fracture LEFT femur.  No additional fracture, dislocation, or bone destruction.  IMPRESSION: Nondisplaced intertrochanteric fracture LEFT femur.  Osseous demineralization.  Degenerative changes LEFT knee.   Electronically Signed   By: Lavonia Dana M.D.   On: 07/02/2014 08:05    Microbiology: Recent Results (from the past 240 hour(s))  MRSA PCR Screening     Status: None   Collection Time: 07/02/14  3:56 PM  Result Value Ref Range Status   MRSA by PCR NEGATIVE NEGATIVE Final    Comment:        The GeneXpert MRSA Assay (FDA approved for NASAL specimens only), is one component of a comprehensive MRSA colonization surveillance program. It is not intended to diagnose MRSA infection nor to guide or monitor treatment for MRSA infections.      Labs: Basic Metabolic Panel:  Recent Labs Lab 07/02/14 0613 07/03/14 0912  NA 131* 131*  K 3.8 4.2  CL 102 101  CO2 19 24  GLUCOSE 96 137*  BUN 7 7  CREATININE 0.58 0.68  CALCIUM 8.1* 7.6*   Liver Function Tests:  Recent Labs Lab 07/02/14 0613 07/03/14 0912  AST 17 26  ALT 16 15  ALKPHOS 127* 122*   BILITOT 0.5 0.6  PROT 6.0 5.5*  ALBUMIN 3.1* 2.6*   No results for input(s): LIPASE, AMYLASE in the last 168 hours. No results for input(s): AMMONIA in the last 168 hours. CBC:  Recent Labs Lab 07/02/14 0613 07/03/14 0912  WBC 10.8* 7.0  NEUTROABS 8.2*  --   HGB 12.9* 12.9*  HCT 36.8* 38.8*  MCV 95.1 98.5  PLT 360 318   Cardiac Enzymes:  Recent Labs Lab 07/02/14 0613  TROPONINI <0.03   BNP: BNP (last 3 results) No results for input(s): PROBNP in the last 8760 hours. CBG:  Recent Labs Lab 07/02/14 0818 07/02/14 1634 07/02/14 1808 07/03/14 0728 07/04/14 0718  GLUCAP 73 66* 129* 97 118*       SignedDebbe Odea, MD [facility] 07/04/2014, 11:48 AM

## 2015-02-17 DIAGNOSIS — G894 Chronic pain syndrome: Secondary | ICD-10-CM | POA: Diagnosis not present

## 2015-02-17 DIAGNOSIS — I119 Hypertensive heart disease without heart failure: Secondary | ICD-10-CM | POA: Diagnosis not present

## 2015-02-17 DIAGNOSIS — F419 Anxiety disorder, unspecified: Secondary | ICD-10-CM | POA: Diagnosis not present

## 2015-02-17 DIAGNOSIS — R457 State of emotional shock and stress, unspecified: Secondary | ICD-10-CM | POA: Diagnosis not present

## 2015-03-26 DIAGNOSIS — G894 Chronic pain syndrome: Secondary | ICD-10-CM | POA: Diagnosis not present

## 2015-03-26 DIAGNOSIS — F419 Anxiety disorder, unspecified: Secondary | ICD-10-CM | POA: Diagnosis not present

## 2015-04-27 DIAGNOSIS — G894 Chronic pain syndrome: Secondary | ICD-10-CM | POA: Diagnosis not present

## 2015-05-27 DIAGNOSIS — E782 Mixed hyperlipidemia: Secondary | ICD-10-CM | POA: Diagnosis not present

## 2015-05-27 DIAGNOSIS — F419 Anxiety disorder, unspecified: Secondary | ICD-10-CM | POA: Diagnosis not present

## 2015-05-27 DIAGNOSIS — G894 Chronic pain syndrome: Secondary | ICD-10-CM | POA: Diagnosis not present

## 2015-05-27 DIAGNOSIS — Z79899 Other long term (current) drug therapy: Secondary | ICD-10-CM | POA: Diagnosis not present

## 2015-05-28 DIAGNOSIS — F419 Anxiety disorder, unspecified: Secondary | ICD-10-CM | POA: Diagnosis not present

## 2015-05-28 DIAGNOSIS — I119 Hypertensive heart disease without heart failure: Secondary | ICD-10-CM | POA: Diagnosis not present

## 2015-05-28 DIAGNOSIS — Z79899 Other long term (current) drug therapy: Secondary | ICD-10-CM | POA: Diagnosis not present

## 2015-05-28 DIAGNOSIS — G894 Chronic pain syndrome: Secondary | ICD-10-CM | POA: Diagnosis not present

## 2015-06-10 ENCOUNTER — Encounter (HOSPITAL_COMMUNITY): Payer: Self-pay | Admitting: Family Medicine

## 2015-06-10 ENCOUNTER — Emergency Department (HOSPITAL_COMMUNITY)
Admission: EM | Admit: 2015-06-10 | Discharge: 2015-06-10 | Discharge status: Against Medical Advice | Payer: Medicare HMO | Attending: Emergency Medicine | Admitting: Emergency Medicine

## 2015-06-10 DIAGNOSIS — S0180XA Unspecified open wound of other part of head, initial encounter: Secondary | ICD-10-CM | POA: Diagnosis not present

## 2015-06-10 DIAGNOSIS — F17210 Nicotine dependence, cigarettes, uncomplicated: Secondary | ICD-10-CM | POA: Diagnosis not present

## 2015-06-10 DIAGNOSIS — I2510 Atherosclerotic heart disease of native coronary artery without angina pectoris: Secondary | ICD-10-CM | POA: Insufficient documentation

## 2015-06-10 DIAGNOSIS — Y998 Other external cause status: Secondary | ICD-10-CM | POA: Insufficient documentation

## 2015-06-10 DIAGNOSIS — S0181XA Laceration without foreign body of other part of head, initial encounter: Secondary | ICD-10-CM | POA: Diagnosis not present

## 2015-06-10 DIAGNOSIS — Y92410 Unspecified street and highway as the place of occurrence of the external cause: Secondary | ICD-10-CM | POA: Insufficient documentation

## 2015-06-10 DIAGNOSIS — S098XXA Other specified injuries of head, initial encounter: Secondary | ICD-10-CM | POA: Diagnosis not present

## 2015-06-10 DIAGNOSIS — Y9389 Activity, other specified: Secondary | ICD-10-CM | POA: Diagnosis not present
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
## 2015-06-10 NOTE — ED Notes (Signed)
No answer in waiting area.

## 2015-06-10 NOTE — ED Notes (Signed)
No answer in waiting area x 3  

## 2015-06-10 NOTE — ED Notes (Signed)
Pt did not answer when called for ct x 1

## 2016-02-10 ENCOUNTER — Emergency Department (HOSPITAL_COMMUNITY): Payer: Commercial Managed Care - HMO | Admitting: Certified Registered Nurse Anesthetist

## 2016-02-10 ENCOUNTER — Emergency Department (HOSPITAL_COMMUNITY)
Admission: EM | Admit: 2016-02-10 | Discharge: 2016-02-10 | Disposition: A | Discharge status: Home / Self Care | Payer: Commercial Managed Care - HMO | Attending: Physician Assistant | Admitting: Physician Assistant

## 2016-02-10 ENCOUNTER — Encounter (HOSPITAL_COMMUNITY)
Admission: EM | Disposition: A | Discharge status: Home / Self Care | Payer: Self-pay | Source: Home / Self Care | Attending: Physician Assistant

## 2016-02-10 ENCOUNTER — Emergency Department (HOSPITAL_COMMUNITY): Payer: Commercial Managed Care - HMO

## 2016-02-10 ENCOUNTER — Encounter (HOSPITAL_COMMUNITY): Payer: Self-pay | Admitting: Emergency Medicine

## 2016-02-10 DIAGNOSIS — F419 Anxiety disorder, unspecified: Secondary | ICD-10-CM | POA: Diagnosis not present

## 2016-02-10 DIAGNOSIS — S86211A Strain of muscle(s) and tendon(s) of anterior muscle group at lower leg level, right leg, initial encounter: Secondary | ICD-10-CM

## 2016-02-10 DIAGNOSIS — S85141A Laceration of anterior tibial artery, right leg, initial encounter: Secondary | ICD-10-CM | POA: Diagnosis not present

## 2016-02-10 DIAGNOSIS — I2510 Atherosclerotic heart disease of native coronary artery without angina pectoris: Secondary | ICD-10-CM | POA: Insufficient documentation

## 2016-02-10 DIAGNOSIS — Z951 Presence of aortocoronary bypass graft: Secondary | ICD-10-CM | POA: Diagnosis not present

## 2016-02-10 DIAGNOSIS — Z8546 Personal history of malignant neoplasm of prostate: Secondary | ICD-10-CM | POA: Insufficient documentation

## 2016-02-10 DIAGNOSIS — S81811A Laceration without foreign body, right lower leg, initial encounter: Secondary | ICD-10-CM | POA: Diagnosis not present

## 2016-02-10 DIAGNOSIS — S86221A Laceration of muscle(s) and tendon(s) of anterior muscle group at lower leg level, right leg, initial encounter: Secondary | ICD-10-CM | POA: Diagnosis not present

## 2016-02-10 DIAGNOSIS — Z853 Personal history of malignant neoplasm of breast: Secondary | ICD-10-CM | POA: Insufficient documentation

## 2016-02-10 DIAGNOSIS — Z7982 Long term (current) use of aspirin: Secondary | ICD-10-CM | POA: Insufficient documentation

## 2016-02-10 DIAGNOSIS — Y92039 Unspecified place in apartment as the place of occurrence of the external cause: Secondary | ICD-10-CM | POA: Insufficient documentation

## 2016-02-10 DIAGNOSIS — F1010 Alcohol abuse, uncomplicated: Secondary | ICD-10-CM | POA: Diagnosis not present

## 2016-02-10 DIAGNOSIS — S81819A Laceration without foreign body, unspecified lower leg, initial encounter: Secondary | ICD-10-CM | POA: Diagnosis not present

## 2016-02-10 DIAGNOSIS — Z955 Presence of coronary angioplasty implant and graft: Secondary | ICD-10-CM | POA: Insufficient documentation

## 2016-02-10 DIAGNOSIS — S86821A Laceration of other muscle(s) and tendon(s) at lower leg level, right leg, initial encounter: Secondary | ICD-10-CM | POA: Insufficient documentation

## 2016-02-10 DIAGNOSIS — Y93H3 Activity, building and construction: Secondary | ICD-10-CM | POA: Diagnosis not present

## 2016-02-10 DIAGNOSIS — F17210 Nicotine dependence, cigarettes, uncomplicated: Secondary | ICD-10-CM | POA: Insufficient documentation

## 2016-02-10 DIAGNOSIS — Z8673 Personal history of transient ischemic attack (TIA), and cerebral infarction without residual deficits: Secondary | ICD-10-CM | POA: Diagnosis not present

## 2016-02-10 DIAGNOSIS — Z79899 Other long term (current) drug therapy: Secondary | ICD-10-CM | POA: Diagnosis not present

## 2016-02-10 DIAGNOSIS — W268XXA Contact with other sharp object(s), not elsewhere classified, initial encounter: Secondary | ICD-10-CM | POA: Diagnosis not present

## 2016-02-10 DIAGNOSIS — Z23 Encounter for immunization: Secondary | ICD-10-CM | POA: Diagnosis not present

## 2016-02-10 DIAGNOSIS — E785 Hyperlipidemia, unspecified: Secondary | ICD-10-CM | POA: Insufficient documentation

## 2016-02-10 DIAGNOSIS — E7800 Pure hypercholesterolemia, unspecified: Secondary | ICD-10-CM | POA: Diagnosis not present

## 2016-02-10 DIAGNOSIS — IMO0002 Reserved for concepts with insufficient information to code with codable children: Secondary | ICD-10-CM

## 2016-02-10 HISTORY — PX: TENDON REPAIR: SHX5111

## 2016-02-10 HISTORY — PX: I & D EXTREMITY: SHX5045

## 2016-02-10 LAB — I-STAT CHEM 8, ED
BUN: 4 mg/dL — ABNORMAL LOW (ref 6–20)
CALCIUM ION: 1.09 mmol/L — AB (ref 1.15–1.40)
Chloride: 103 mmol/L (ref 101–111)
Creatinine, Ser: 0.8 mg/dL (ref 0.61–1.24)
Glucose, Bld: 89 mg/dL (ref 65–99)
HCT: 48 % (ref 39.0–52.0)
Hemoglobin: 16.3 g/dL (ref 13.0–17.0)
Potassium: 4.2 mmol/L (ref 3.5–5.1)
Sodium: 139 mmol/L (ref 135–145)
TCO2: 25 mmol/L (ref 0–100)

## 2016-02-10 LAB — SURGICAL PCR SCREEN
MRSA, PCR: NEGATIVE
STAPHYLOCOCCUS AUREUS: NEGATIVE

## 2016-02-10 SURGERY — IRRIGATION AND DEBRIDEMENT EXTREMITY
Anesthesia: General | Site: Leg Lower | Laterality: Right

## 2016-02-10 MED ORDER — FENTANYL CITRATE (PF) 100 MCG/2ML IJ SOLN
INTRAMUSCULAR | Status: DC | PRN
  Administered 2016-02-10: 100 ug via INTRAVENOUS
  Administered 2016-02-10 (×2): 50 ug via INTRAVENOUS

## 2016-02-10 MED ORDER — CLINDAMYCIN PHOSPHATE 600 MG/50ML IV SOLN
600.0000 mg | Freq: Once | INTRAVENOUS | Status: AC
Start: 2016-02-10 — End: 2016-02-10
  Administered 2016-02-10: 600 mg via INTRAVENOUS
  Filled 2016-02-10: qty 50

## 2016-02-10 MED ORDER — OXYCODONE-ACETAMINOPHEN 10-325 MG PO TABS: 1.0000 | ORAL_TABLET | Freq: Four times a day (QID) | ORAL | 0 refills | Status: DC | PRN

## 2016-02-10 MED ORDER — SUGAMMADEX SODIUM 200 MG/2ML IV SOLN
INTRAVENOUS | Status: DC | PRN
  Administered 2016-02-10: 100 mg via INTRAVENOUS

## 2016-02-10 MED ORDER — PROMETHAZINE HCL 25 MG/ML IJ SOLN: 6.2500 mg | INTRAMUSCULAR | Status: DC | PRN

## 2016-02-10 MED ORDER — CLINDAMYCIN PHOSPHATE 900 MG/50ML IV SOLN
900.0000 mg | INTRAVENOUS | Status: AC
  Administered 2016-02-10: 900 mg via INTRAVENOUS

## 2016-02-10 MED ORDER — ROCURONIUM BROMIDE 10 MG/ML (PF) SYRINGE
PREFILLED_SYRINGE | INTRAVENOUS | Status: AC
  Filled 2016-02-10: qty 10

## 2016-02-10 MED ORDER — EPHEDRINE SULFATE 50 MG/ML IJ SOLN
INTRAMUSCULAR | Status: DC | PRN
  Administered 2016-02-10 (×2): 10 mg via INTRAVENOUS
  Administered 2016-02-10 (×2): 15 mg via INTRAVENOUS

## 2016-02-10 MED ORDER — ROCURONIUM BROMIDE 100 MG/10ML IV SOLN
INTRAVENOUS | Status: DC | PRN
  Administered 2016-02-10: 40 mg via INTRAVENOUS

## 2016-02-10 MED ORDER — PROPOFOL 10 MG/ML IV BOLUS
INTRAVENOUS | Status: AC
  Filled 2016-02-10: qty 20

## 2016-02-10 MED ORDER — GLYCOPYRROLATE 0.2 MG/ML IJ SOLN
INTRAMUSCULAR | Status: DC | PRN
  Administered 2016-02-10: .2 mg via INTRAVENOUS

## 2016-02-10 MED ORDER — FENTANYL CITRATE (PF) 100 MCG/2ML IJ SOLN
INTRAMUSCULAR | Status: AC
  Filled 2016-02-10: qty 4

## 2016-02-10 MED ORDER — LACTATED RINGERS IV SOLN
INTRAVENOUS | Status: DC
Start: 2016-02-10 — End: 2016-02-10
  Administered 2016-02-10 (×2): via INTRAVENOUS

## 2016-02-10 MED ORDER — MIDAZOLAM HCL 2 MG/2ML IJ SOLN
INTRAMUSCULAR | Status: AC
  Filled 2016-02-10: qty 2

## 2016-02-10 MED ORDER — TETANUS-DIPHTH-ACELL PERTUSSIS 5-2.5-18.5 LF-MCG/0.5 IM SUSP
0.5000 mL | Freq: Once | INTRAMUSCULAR | Status: AC
  Administered 2016-02-10: 0.5 mL via INTRAMUSCULAR
  Filled 2016-02-10: qty 0.5

## 2016-02-10 MED ORDER — HYDROCODONE-ACETAMINOPHEN 5-325 MG PO TABS
ORAL_TABLET | ORAL | Status: DC
Start: 2016-02-10 — End: 2016-02-10
  Filled 2016-02-10: qty 1

## 2016-02-10 MED ORDER — CLINDAMYCIN PHOSPHATE 900 MG/50ML IV SOLN
INTRAVENOUS | Status: AC
  Filled 2016-02-10: qty 50

## 2016-02-10 MED ORDER — SODIUM CHLORIDE 0.9 % IR SOLN
Status: DC | PRN
  Administered 2016-02-10: 3000 mL
  Administered 2016-02-10: 1000 mL

## 2016-02-10 MED ORDER — MORPHINE SULFATE (PF) 4 MG/ML IV SOLN
4.0000 mg | Freq: Once | INTRAVENOUS | Status: AC
  Administered 2016-02-10: 4 mg via INTRAVENOUS
  Filled 2016-02-10: qty 1

## 2016-02-10 MED ORDER — LIDOCAINE HCL (CARDIAC) 20 MG/ML IV SOLN: INTRAVENOUS | Status: DC | PRN

## 2016-02-10 MED ORDER — HYDROMORPHONE HCL 1 MG/ML IJ SOLN
INTRAMUSCULAR | Status: AC
  Administered 2016-02-10: 0.5 mg via INTRAVENOUS
  Filled 2016-02-10: qty 1

## 2016-02-10 MED ORDER — PHENYLEPHRINE HCL 10 MG/ML IJ SOLN
INTRAMUSCULAR | Status: DC | PRN
Start: 2016-02-10 — End: 2016-02-10
  Administered 2016-02-10 (×3): 80 ug via INTRAVENOUS
  Administered 2016-02-10: 160 ug via INTRAVENOUS
  Administered 2016-02-10 (×3): 80 ug via INTRAVENOUS
  Administered 2016-02-10: 160 ug via INTRAVENOUS
  Administered 2016-02-10 (×5): 80 ug via INTRAVENOUS

## 2016-02-10 MED ORDER — ONDANSETRON HCL 4 MG/2ML IJ SOLN
INTRAMUSCULAR | Status: AC
  Filled 2016-02-10: qty 2

## 2016-02-10 MED ORDER — LIDOCAINE-EPINEPHRINE (PF) 2 %-1:200000 IJ SOLN
10.0000 mL | Freq: Once | INTRAMUSCULAR | Status: DC
  Filled 2016-02-10: qty 20

## 2016-02-10 MED ORDER — MEPERIDINE HCL 25 MG/ML IJ SOLN: 6.2500 mg | INTRAMUSCULAR | Status: DC | PRN

## 2016-02-10 MED ORDER — HYDROMORPHONE HCL 1 MG/ML IJ SOLN
0.2500 mg | INTRAMUSCULAR | Status: DC | PRN
  Administered 2016-02-10 (×2): 0.5 mg via INTRAVENOUS

## 2016-02-10 MED ORDER — SUGAMMADEX SODIUM 200 MG/2ML IV SOLN
INTRAVENOUS | Status: AC
  Filled 2016-02-10: qty 2

## 2016-02-10 MED ORDER — LIDOCAINE 2% (20 MG/ML) 5 ML SYRINGE
INTRAMUSCULAR | Status: AC
  Filled 2016-02-10: qty 5

## 2016-02-10 MED ORDER — SUCCINYLCHOLINE CHLORIDE 20 MG/ML IJ SOLN
INTRAMUSCULAR | Status: DC | PRN
Start: 2016-02-10 — End: 2016-02-10
  Administered 2016-02-10: 120 mg via INTRAVENOUS

## 2016-02-10 MED ORDER — BUPIVACAINE HCL (PF) 0.25 % IJ SOLN
INTRAMUSCULAR | Status: DC | PRN
  Administered 2016-02-10: .009 mL

## 2016-02-10 MED ORDER — LIDOCAINE HCL (CARDIAC) 20 MG/ML IV SOLN
INTRAVENOUS | Status: DC | PRN
  Administered 2016-02-10: 60 mg via INTRAVENOUS

## 2016-02-10 MED ORDER — ONDANSETRON HCL 4 MG/2ML IJ SOLN
INTRAMUSCULAR | Status: DC | PRN
  Administered 2016-02-10: 4 mg via INTRAVENOUS

## 2016-02-10 MED ORDER — CHLORHEXIDINE GLUCONATE 4 % EX LIQD: 60.0000 mL | Freq: Once | CUTANEOUS | Status: DC

## 2016-02-10 MED ORDER — LACTATED RINGERS IV SOLN
INTRAVENOUS | Status: DC
Start: 2016-02-10 — End: 2016-02-10

## 2016-02-10 MED ORDER — HYDROCODONE-ACETAMINOPHEN 5-325 MG PO TABS
1.0000 | ORAL_TABLET | Freq: Once | ORAL | Status: DC
  Filled 2016-02-10: qty 1

## 2016-02-10 MED ORDER — BUPIVACAINE HCL (PF) 0.25 % IJ SOLN
INTRAMUSCULAR | Status: AC
  Filled 2016-02-10: qty 30

## 2016-02-10 MED ORDER — DOXYCYCLINE HYCLATE 100 MG PO TABS: 100.0000 mg | ORAL_TABLET | Freq: Two times a day (BID) | ORAL | 0 refills | Status: DC

## 2016-02-10 MED ORDER — PROPOFOL 10 MG/ML IV BOLUS
INTRAVENOUS | Status: DC | PRN
  Administered 2016-02-10: 50 mg via INTRAVENOUS

## 2016-02-10 MED ORDER — PHENYLEPHRINE 40 MCG/ML (10ML) SYRINGE FOR IV PUSH (FOR BLOOD PRESSURE SUPPORT)
PREFILLED_SYRINGE | INTRAVENOUS | Status: AC
  Filled 2016-02-10: qty 20

## 2016-02-10 MED ORDER — MIDAZOLAM HCL 5 MG/5ML IJ SOLN
INTRAMUSCULAR | Status: DC | PRN
  Administered 2016-02-10: 2 mg via INTRAVENOUS

## 2016-02-10 MED ORDER — CLINDAMYCIN PHOSPHATE 600 MG/50ML IV SOLN
INTRAVENOUS | Status: AC
  Filled 2016-02-10: qty 50

## 2016-02-10 SURGICAL SUPPLY — 71 items
BANDAGE ACE 4X5 VEL STRL LF (GAUZE/BANDAGES/DRESSINGS) ×3 IMPLANT
BANDAGE ACE 6X5 VEL STRL LF (GAUZE/BANDAGES/DRESSINGS) ×3 IMPLANT
BANDAGE ELASTIC 4 VELCRO ST LF (GAUZE/BANDAGES/DRESSINGS) ×2 IMPLANT
BANDAGE ELASTIC 6 VELCRO ST LF (GAUZE/BANDAGES/DRESSINGS) ×2 IMPLANT
BANDAGE ESMARK 6X9 LF (GAUZE/BANDAGES/DRESSINGS) ×1 IMPLANT
BIT DRILL 7/64X5 DISP (BIT) ×3 IMPLANT
BNDG CMPR 9X6 STRL LF SNTH (GAUZE/BANDAGES/DRESSINGS) ×1
BNDG COHESIVE 4X5 TAN STRL (GAUZE/BANDAGES/DRESSINGS) ×3 IMPLANT
BNDG ESMARK 6X9 LF (GAUZE/BANDAGES/DRESSINGS) ×3
BNDG GAUZE ELAST 4 BULKY (GAUZE/BANDAGES/DRESSINGS) ×3 IMPLANT
BRUSH SCRUB EZ PLAIN DRY (MISCELLANEOUS) ×2 IMPLANT
COVER MAYO STAND STRL (DRAPES) ×3 IMPLANT
CUFF TOURNIQUET SINGLE 34IN LL (TOURNIQUET CUFF) ×2 IMPLANT
DRAPE INCISE IOBAN 66X45 STRL (DRAPES) ×3 IMPLANT
DRAPE U-SHAPE 47X51 STRL (DRAPES) ×3 IMPLANT
DRSG ADAPTIC 3X8 NADH LF (GAUZE/BANDAGES/DRESSINGS) ×3 IMPLANT
DRSG EMULSION OIL 3X3 NADH (GAUZE/BANDAGES/DRESSINGS) ×3 IMPLANT
DRSG PAD ABDOMINAL 8X10 ST (GAUZE/BANDAGES/DRESSINGS) ×3 IMPLANT
DURAPREP 26ML APPLICATOR (WOUND CARE) ×3 IMPLANT
ELECT REM PT RETURN 9FT ADLT (ELECTROSURGICAL) ×3
ELECTRODE REM PT RTRN 9FT ADLT (ELECTROSURGICAL) ×1 IMPLANT
GAUZE SPONGE 4X4 12PLY STRL (GAUZE/BANDAGES/DRESSINGS) ×3 IMPLANT
GAUZE XEROFORM 1X8 LF (GAUZE/BANDAGES/DRESSINGS) ×3 IMPLANT
GLOVE BIO SURGEON STRL SZ 6.5 (GLOVE) ×1 IMPLANT
GLOVE BIO SURGEONS STRL SZ 6.5 (GLOVE) ×1
GLOVE BIOGEL PI IND STRL 6.5 (GLOVE) IMPLANT
GLOVE BIOGEL PI IND STRL 8 (GLOVE) ×2 IMPLANT
GLOVE BIOGEL PI INDICATOR 6.5 (GLOVE) ×2
GLOVE BIOGEL PI INDICATOR 8 (GLOVE) ×4
GLOVE ECLIPSE 7.5 STRL STRAW (GLOVE) ×6 IMPLANT
GLOVE SURG SS PI 7.0 STRL IVOR (GLOVE) ×4 IMPLANT
GOWN STRL REUS W/ TWL LRG LVL3 (GOWN DISPOSABLE) ×2 IMPLANT
GOWN STRL REUS W/ TWL XL LVL3 (GOWN DISPOSABLE) ×2 IMPLANT
GOWN STRL REUS W/TWL LRG LVL3 (GOWN DISPOSABLE) ×12
GOWN STRL REUS W/TWL XL LVL3 (GOWN DISPOSABLE) ×6
HANDPIECE INTERPULSE COAX TIP (DISPOSABLE) ×3
IMMOBILIZER KNEE 22 UNIV (SOFTGOODS) IMPLANT
KIT BASIN OR (CUSTOM PROCEDURE TRAY) ×3 IMPLANT
KIT ROOM TURNOVER OR (KITS) ×3 IMPLANT
MANIFOLD NEPTUNE II (INSTRUMENTS) ×3 IMPLANT
NEEDLE 22X1 1/2 (OR ONLY) (NEEDLE) ×2 IMPLANT
NS IRRIG 1000ML POUR BTL (IV SOLUTION) ×3 IMPLANT
PACK ORTHO EXTREMITY (CUSTOM PROCEDURE TRAY) ×3 IMPLANT
PAD ARMBOARD 7.5X6 YLW CONV (MISCELLANEOUS) ×6 IMPLANT
PAD CAST 4YDX4 CTTN HI CHSV (CAST SUPPLIES) ×2 IMPLANT
PADDING CAST COTTON 4X4 STRL (CAST SUPPLIES) ×9
PASSER SUT SWANSON 36MM LOOP (INSTRUMENTS) ×3 IMPLANT
SCRUB POVIDONE IODINE 4 OZ (MISCELLANEOUS) ×2 IMPLANT
SET HNDPC FAN SPRY TIP SCT (DISPOSABLE) IMPLANT
SOL PREP POV-IOD 4OZ 10% (MISCELLANEOUS) ×2 IMPLANT
SPLINT FIBERGLASS 4X30 (CAST SUPPLIES) ×2 IMPLANT
SPONGE LAP 18X18 X RAY DECT (DISPOSABLE) ×3 IMPLANT
STAPLER VISISTAT 35W (STAPLE) ×3 IMPLANT
STOCKINETTE IMPERVIOUS 9X36 MD (GAUZE/BANDAGES/DRESSINGS) ×3 IMPLANT
SUT ETHILON 3 0 PS 1 (SUTURE) ×2 IMPLANT
SUT ETHILON 4 0 PS 2 18 (SUTURE) ×2 IMPLANT
SUT FIBERWIRE 2-0 18 17.9 3/8 (SUTURE) ×3
SUT VIC AB 1 CT1 27 (SUTURE) ×3
SUT VIC AB 1 CT1 27XBRD ANBCTR (SUTURE) ×1 IMPLANT
SUT VIC AB 2-0 CTB1 (SUTURE) ×3 IMPLANT
SUT VIC AB 3-0 PS2 18 (SUTURE) ×3
SUT VIC AB 3-0 PS2 18XBRD (SUTURE) IMPLANT
SUTURE FIBERWR 2-0 18 17.9 3/8 (SUTURE) IMPLANT
SYR CONTROL 10ML LL (SYRINGE) ×2 IMPLANT
TOWEL OR 17X24 6PK STRL BLUE (TOWEL DISPOSABLE) ×3 IMPLANT
TOWEL OR 17X26 10 PK STRL BLUE (TOWEL DISPOSABLE) ×3 IMPLANT
TUBE CONNECTING 12'X1/4 (SUCTIONS) ×1
TUBE CONNECTING 12X1/4 (SUCTIONS) ×2 IMPLANT
UNDERPAD 30X30 (UNDERPADS AND DIAPERS) ×3 IMPLANT
WATER STERILE IRR 1000ML POUR (IV SOLUTION) ×3 IMPLANT
YANKAUER SUCT BULB TIP NO VENT (SUCTIONS) ×3 IMPLANT

## 2016-02-10 NOTE — Anesthesia Preprocedure Evaluation (Addendum)
Anesthesia Evaluation  Patient identified by MRN, date of birth, ID band Patient awake    Reviewed: Allergy & Precautions, NPO status , Patient's Chart, lab work & pertinent test results  Airway Mallampati: II  TM Distance: >3 FB Neck ROM: Full    Dental  (+) Dental Advisory Given, Edentulous Upper, Missing, Chipped, Poor Dentition   Pulmonary Current Smoker,    breath sounds clear to auscultation       Cardiovascular + CAD, + Cardiac Stents, + CABG and + Peripheral Vascular Disease   Rhythm:Regular Rate:Normal     Neuro/Psych PSYCHIATRIC DISORDERS Anxiety CVA    GI/Hepatic negative GI ROS, Neg liver ROS,   Endo/Other  negative endocrine ROS  Renal/GU negative Renal ROS  negative genitourinary   Musculoskeletal negative musculoskeletal ROS (+)   Abdominal   Peds negative pediatric ROS (+)  Hematology negative hematology ROS (+)   Anesthesia Other Findings   Reproductive/Obstetrics negative OB ROS                           Anesthesia Physical Anesthesia Plan  ASA: III  Anesthesia Plan: General   Post-op Pain Management:    Induction: Intravenous, Rapid sequence and Cricoid pressure planned  Airway Management Planned: Oral ETT  Additional Equipment:   Intra-op Plan:   Post-operative Plan: Extubation in OR  Informed Consent: I have reviewed the patients History and Physical, chart, labs and discussed the procedure including the risks, benefits and alternatives for the proposed anesthesia with the patient or authorized representative who has indicated his/her understanding and acceptance.   Dental advisory given  Plan Discussed with: CRNA  Anesthesia Plan Comments:         Anesthesia Quick Evaluation

## 2016-02-10 NOTE — ED Notes (Signed)
Consent obtained

## 2016-02-10 NOTE — Transfer of Care (Signed)
Immediate Anesthesia Transfer of Care Note  Patient: [name] Pace  Procedure(s) Performed: Procedure(s): IRRIGATION AND DEBRIDEMENT EXTREMITY (Right) Anterior TENDON REPAIR and wound exploration. (Right)  Patient Location: PACU  Anesthesia Type:General  Level of Consciousness: awake, alert , oriented and patient cooperative  Airway & Oxygen Therapy: Patient Spontanous Breathing  Post-op Assessment: Report given to RN and Post -op Vital signs reviewed and stable  Post vital signs: Reviewed and stable  Last Vitals:  Vitals:   02/10/16 1530 02/10/16 1746  BP: 104/88   Pulse: 66   Resp:    Temp:  36.4 C    Last Pain:  Vitals:   02/10/16 1545  TempSrc:   PainSc: 0-No pain         Complications: No apparent anesthesia complications

## 2016-02-10 NOTE — Anesthesia Postprocedure Evaluation (Signed)
Anesthesia Post Note  Patient: TYMIR ONEAL  Procedure(s) Performed: Procedure(s) (LRB): IRRIGATION AND DEBRIDEMENT EXTREMITY (Right) Anterior TENDON REPAIR and wound exploration. (Right)  Patient location during evaluation: PACU Anesthesia Type: General Level of consciousness: awake and alert Pain management: pain level controlled Vital Signs Assessment: post-procedure vital signs reviewed and stable Respiratory status: spontaneous breathing, nonlabored ventilation, respiratory function stable and patient connected to nasal cannula oxygen Cardiovascular status: blood pressure returned to baseline and stable Postop Assessment: no signs of nausea or vomiting Anesthetic complications: no    Last Vitals:  Vitals:   02/10/16 1845 02/10/16 1900  BP: 109/80   Pulse: 76 87  Resp: 12 (!) 24  Temp:  36.5 C    Last Pain:  Vitals:   02/10/16 1842  TempSrc:   PainSc: [place]ynlee Kressin

## 2016-02-10 NOTE — ED Notes (Signed)
Orthopedic at bedside
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
## 2016-02-10 NOTE — H&P (Signed)
PREOPERATIVE H&P  Chief Complaint: Laceration right lower extremity with inability to lift foot  HPI: [name] Pace is a 65 y.o. male who presents for evaluation of laceration right lower extremity with inability to lift foot. It has been present for several hours and has been worsening. He has failed conservative measures. He has no previous history of injury foot or inability to move the foot in the past. Pain is rated as mild.  Past Medical History:  Diagnosis Date  . Alcohol abuse   . Anxiety   . Breast cancer ([place]) Left breast   Per patient diagnosed in early 69's.   . Coronary artery disease   . High cholesterol   . Prostate cancer ([place])    per patient diagnosed in early 42's  . Stroke [facility])    Past Surgical History:  Procedure Laterality Date  . CAROTID STENT    . CORONARY ARTERY BYPASS GRAFT    . FEMUR IM NAIL Left 07/02/2014   Procedure: INTRAMEDULLARY (IM) NAIL FEMORAL;  Surgeon: Elie Goody, MD;  Location: [facility];  Service: Orthopedics;  Laterality: Left;   Social History   Social History  . Marital status: Married    Spouse name: N/A  . Number of children: N/A  . Years of education: N/A   Social History Main Topics  . Smoking status: Current Every Day Smoker    Packs/day: 1.00    Years: 42.00    Types: Cigarettes  . Smokeless tobacco: Never Used  . Alcohol use Yes     Comment: 4-5 beers a day  . Drug use: No  . Sexual activity: Not Asked   Other Topics Concern  . None   Social History Narrative  . None   Family History  Problem Relation Age of Onset  . Dementia Mother    Allergies  Allergen Reactions  . Penicillins Anaphylaxis    throat   Prior to Admission medications   Medication Sig Start Date End Date Taking? Authorizing Provider  ALPRAZolam Duanne Moron) 1 MG tablet Take 1 mg by mouth 3 (three) times daily.  11/19/13  Yes Historical Provider, MD  aspirin EC 325 MG EC tablet Take 1 tablet (325 mg total) by mouth 2 (two) times daily  with a meal. 07/04/14  Yes Debbe Odea, MD  atorvastatin (LIPITOR) 40 MG tablet Take 40 mg by mouth daily.   Yes Historical Provider, MD  HYDROcodone-acetaminophen (NORCO/VICODIN) 5-325 MG per tablet Take 1-2 tablets by mouth every 6 (six) hours as needed for moderate pain. 07/04/14  Yes Debbe Odea, MD     Positive ROS: None  All other systems have been reviewed and were otherwise negative with the exception of those mentioned in the HPI and as above.  Physical Exam: Vitals:   02/10/16 1226 02/10/16 1232  BP: (!) 96/54 92/73  Pulse: 94   Resp: 18   Temp: 98 F (36.7 C)     General: Alert, no acute distress Cardiovascular: No pedal edema Respiratory: No cyanosis, no use of accessory musculature GI: No organomegaly, abdomen is soft and non-tender Skin: No lesions in the area of chief complaint Neurologic: Sensation intact distally Psychiatric: Patient is competent for consent with normal mood and affect Lymphatic: No axillary or cervical lymphadenopathy  MUSCULOSKELETAL: Right lower extremity: 2.5; laceration over the anterior aspect of the ankle. There is tendon exposed in the wound. There is inability to raise the foot.  Assessment/Plan: Right Lower Extremity Laceration with anterior tibial tendon laceration Plan for Procedure(s):  IRRIGATION AND DEBRIDEMENT WITH WOUND EXPLORATION AND ANTERIOR TIBIAL TENDON REPAIR  The risks benefits and alternatives were discussed with the patient including but not limited to the risks of nonoperative treatment, versus surgical intervention including infection, bleeding, nerve injury, malunion, nonunion, hardware prominence, hardware failure, need for hardware removal, blood clots, cardiopulmonary complications, morbidity, mortality, among others, and they were willing to proceed.  Predicted outcome is good, although there will be at least a six to nine month expected recovery.  Alta Corning, MD 02/10/2016 3:42 PM

## 2016-02-10 NOTE — ED Provider Notes (Signed)
[name] Pace Provider Note   CSN: [id number] Arrival date & time: 02/10/16  1221     History   Chief Complaint Chief Complaint  Patient presents with  . Extremity Laceration    HPI [name] Pace is a 65 y.o. male.  Patient is a 65 year old male with history of hyperlipidemia, CAD, alcohol abuse and CVA who presents to the ED with complaints of right lower leg laceration, onset prior to arrival. 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 He states while he was smashing the toilets with a sledgehammer a piece of the toilet flew off and hit his right lower leg. He reports having a cut to his right lower leg, bleeding controlled prior to arrival. Patient reports associated pain to affected area. Denies fever, lightheadedness, dizziness, numbness, tingling, weakness. Denies use of anticoagulants. Tetanus status unknown. Any medications prior to arrival.      Past Medical History:  Diagnosis Date  . Alcohol abuse   . Anxiety   . Breast cancer ([place]) Left breast   Per patient diagnosed in early 80's.   . Coronary artery disease   . High cholesterol   . Prostate cancer ([place])    per patient diagnosed in early 31's  . Stroke [facility])     Patient Active Problem List   Diagnosis Date Noted  . Hip fracture, left ([place]) 07/02/2014  . Closed left hip fracture ([place]) 07/02/2014  . Hip fracture ([place]) 07/02/2014  . Intertrochanteric fracture of left femur ([place]) 07/02/2014  . History of stroke   . Preop examination   . Subclavian artery stenosis, left 06/15/2014  . Chest pain 06/14/2014  . CAP (community acquired pneumonia) 06/14/2014  . Hyponatremia 06/14/2014  . Sepsis ([place]) 06/14/2014  . Alcohol abuse 06/14/2014  . CAD (coronary artery disease) 06/14/2014  . Tobacco abuse 06/14/2014    Past Surgical History:  Procedure Laterality Date  . CAROTID STENT    . CORONARY ARTERY BYPASS GRAFT    . FEMUR IM NAIL Left 07/02/2014   Procedure: INTRAMEDULLARY (IM) NAIL FEMORAL;  Surgeon: Elie Goody, MD;  Location: [facility];  Service: Orthopedics;  Laterality: Left;       Home Medications    Prior to Admission medications   Medication Sig Start Date End Date Taking? Authorizing Provider  ALPRAZolam Duanne Moron) 1 MG tablet Take 1 mg by mouth 3 (three) times daily.  11/19/13  Yes Historical Provider, MD  aspirin EC 325 MG EC tablet Take 1 tablet (325 mg total) by mouth 2 (two) times daily with a meal. 07/04/14  Yes Debbe Odea, MD  atorvastatin (LIPITOR) 40 MG tablet Take 40 mg by mouth daily.   Yes Historical Provider, MD  HYDROcodone-acetaminophen (NORCO/VICODIN) 5-325 MG per tablet Take 1-2 tablets by mouth every 6 (six) hours as needed for moderate pain. 07/04/14  Yes Debbe Odea, MD    Family History Family History  Problem Relation Age of Onset  . Dementia Mother     Social History Social History  Substance Use Topics  . Smoking status: Current Every Day Smoker    Packs/day: 1.00    Years: 42.00    Types: Cigarettes  . Smokeless tobacco: Never Used  . Alcohol use Yes     Comment: 4-5 beers a day     Allergies   Penicillins   Review of Systems Review of Systems  Constitutional: Negative for fever.  Musculoskeletal: Positive for arthralgias (right lower leg). Negative for joint swelling.  Skin: Positive for wound (laceration).  Neurological: Negative for weakness and numbness.  All other systems reviewed and are negative.    Physical Exam Updated Vital Signs BP 92/73 (BP Location: Right Arm)   Pulse 94   Temp 98 F (36.7 C) (Oral)   Resp 18   SpO2 95%   Physical Exam  Constitutional: He is oriented to person, place, and time. He appears well-developed and well-nourished.  HENT:  Head: Normocephalic and atraumatic.  Eyes: Conjunctivae and EOM are normal. Right eye exhibits no discharge. Left eye exhibits no discharge. No scleral icterus.  Neck: Normal range of motion. Neck supple.   Cardiovascular: Normal rate, regular rhythm, normal heart sounds and intact distal pulses.   Pulmonary/Chest: Effort normal and breath sounds normal. No respiratory distress.  Abdominal: Soft. He exhibits no distension.  Musculoskeletal: He exhibits no edema.       Right lower leg: He exhibits tenderness and laceration. He exhibits no swelling, no edema and no deformity.       Legs: Unable to dorsiflex right foot. Small movement of superficial tendon visible in laceration. Numbness noted to anterior medial foot along L5 dermatome. 2+ DP pulse. Cap refill <2. FROM of right knee, ankle and toes.  Neurological: He is alert and oriented to person, place, and time.  Skin: Skin is warm and dry.  2cm superficial laceration noted to right anterior lower shin. No active bleeding. Superficial tendon visible.  Nursing note and vitals reviewed.    ED Treatments / Results  Labs (all labs ordered are listed, but only abnormal results are displayed) Labs Reviewed  I-STAT CHEM 8, ED    EKG  EKG Interpretation None       Radiology Dg Tibia/fibula Right  Result Date: 02/10/2016 CLINICAL DATA:  LACERATION TO DISTAL ANTERIOR LOWER LEG WHILE BREAKING UP COMMODE EXAM: RIGHT TIBIA AND FIBULA - 2 VIEW COMPARISON:  None. FINDINGS: No fracture.  No bone lesion. Knee and ankle joints are normally spaced and aligned. Mild soft tissue swelling along the anterior lower leg/ankle. No radiopaque foreign body. IMPRESSION: No fracture or radiopaque foreign body. Electronically Signed   By: Lajean Manes M.D.   On: 02/10/2016 13:09    Procedures Procedures (including critical care time)  Medications Ordered in ED Medications  Tdap (BOOSTRIX) injection 0.5 mL (0.5 mLs Intramuscular Given 02/10/16 1504)  morphine 4 MG/ML injection 4 mg (4 mg Intravenous Given 02/10/16 1527)     Initial Impression / Assessment and Plan / ED Course  I have reviewed the triage vital signs and the nursing notes.  Pertinent labs &  imaging results that were available during my care of the patient were reviewed by me and considered in my medical decision making (see chart for details).  Clinical Course    Patient presents with right lower leg laceration, bleeding controlled prior to arrival. Tetanus updated in the ED. VSS. Exam revealed 2 cm laceration to right lower anterior shin with visible superficial tendon. Patient unable to dorsiflex right foot. 2+ DP pulses on the right. Decreased sensation to dorsal aspect of right medial foot. Remaining exam unremarkable. Patient given pain meds in the ED. Consulted ortho. Dr. Berenice Primas reports he will come evaluate the pt in the ED. Dr. Berenice Primas reports after discussing plan with patient he will plan to take patient to the OR to repair injury. Plan for patient to go to OR.   Final Clinical Impressions(s) / ED Diagnoses   Final diagnoses:  Laceration    New  Prescriptions New Prescriptions   No medications on file     Nona Dell, [place] 02/10/16 [place], MD 02/12/16 1046

## 2016-02-10 NOTE — Discharge Instructions (Signed)
Elevate right leg as much as possible Ambulate NON WEIGHT BEARING ON YOUR RIGHT LEG WITH CRUTCHES. DO NOT REMOVE SPLINT.

## 2016-02-10 NOTE — Anesthesia Procedure Notes (Addendum)
Procedure Name: Intubation Date/Time: 02/10/2016 4:36 PM Performed by: Salli Quarry Torez Beauregard Pre-anesthesia Checklist: Patient identified, Emergency Drugs available, Suction available and Patient being monitored Patient Re-evaluated:Patient Re-evaluated prior to inductionOxygen Delivery Method: Circle System Utilized Preoxygenation: Pre-oxygenation with 100% oxygen Intubation Type: IV induction, Rapid sequence and Cricoid Pressure applied Ventilation: Mask ventilation without difficulty Laryngoscope Size: Miller and 2 Grade View: Grade II Tube type: Oral Tube size: 7.5 mm Number of attempts: 1 Airway Equipment and Method: Stylet Placement Confirmation: ETT inserted through vocal cords under direct vision,  positive ETCO2 and breath sounds checked- equal and bilateral Secured at: 23 cm Tube secured with: Tape Dental Injury: Teeth and Oropharynx as per pre-operative assessment

## 2016-02-10 NOTE — Progress Notes (Signed)
Orthopedic Tech Progress Note Patient Details:  [name] Pace Mar 24, 1951 [id number]  Ortho Devices Type of Ortho Device: Crutches Ortho Device/Splint Interventions: Ordered, Adjustment   Braulio Bosch 02/10/2016, 6:16 PM

## 2016-02-10 NOTE — Brief Op Note (Signed)
02/10/2016  5:35 PM  PATIENT:  [name] Pace  65 y.o. male  PRE-OPERATIVE DIAGNOSIS:  Right Lower Extremity Laceration  POST-OPERATIVE DIAGNOSIS:  Right Lower Extremity Laceration  PROCEDURE:  Procedure(s): IRRIGATION AND DEBRIDEMENT EXTREMITY (Right) Anterior TENDON REPAIR and wound exploration. (Right)  SURGEON:  Surgeon(s) and Role:    * Dorna Leitz, MD - Primary  PHYSICIAN ASSISTANT:   ASSISTANTS: bethune   ANESTHESIA:   general  EBL:  No intake/output data recorded.  BLOOD ADMINISTERED:none  DRAINS: none   LOCAL MEDICATIONS USED:  NONE  SPECIMEN:  No Specimen  DISPOSITION OF SPECIMEN:  N/A  COUNTS:  YES  TOURNIQUET:  * Missing tourniquet times found for documented tourniquets in log:  [id number] *  DICTATION: .Other Dictation: Dictation Number [id number]  PLAN OF CARE: Discharge to home after PACU  PATIENT DISPOSITION:  PACU - hemodynamically stable.   Delay start of Pharmacological VTE agent (>24hrs) due to surgical blood loss or risk of bleeding: no

## 2016-02-11 ENCOUNTER — Encounter (HOSPITAL_COMMUNITY): Payer: Self-pay | Admitting: Orthopedic Surgery

## 2016-02-11 NOTE — Op Note (Signed)
NAME:  [name] Pace, [name] Pace NO.:  [id number]  MEDICAL RECORD NO.:  [id number]  LOCATION:  [facility]                         FACILITY:  [facility]  PHYSICIAN:  Alta Corning, M.D.   DATE OF BIRTH:  [birth year]  DATE OF PROCEDURE:  02/10/2016 DATE OF DISCHARGE:  02/10/2016                              OPERATIVE REPORT   He is a 65 year old male.  PREOPERATIVE DIAGNOSES:  Complex laceration, anterior leg with anterior tibial tendon laceration.  POSTOPERATIVE DIAGNOSES:  Complex laceration, anterior leg with anterior tibial tendon laceration.  PROCEDURES: 1. Wound exploration with primary repair of anterior tibial tendon     with a core stitch of #2-0 FiberWire and a tidying stitch of 4-0     nylon. 2. Repair of complex laceration of 3 cm on the anterior aspect of the     right leg.  SURGEON:  Alta Corning, M.D.  ASSISTANT:  Ly Fleet, P.A.  ANESTHESIA:  General.  BRIEF HISTORY:  [name] Pace is a 65 year old male with a history of having a porcelain piece lacerated the anterior aspect of his right ankle.  He was unable to flex his foot.  ER evaluated him and was concerned about seeing tendon in the wound.  We examined him and noted he had inability to dorsiflex.  The wound laceration was right over the anterior tib tendon.  We felt that he had cut this, so he was brought to the operating room for repair.  DESCRIPTION OF PROCEDURE:  The patient was brought to the operative room.  After adequate anesthesia was obtained with general anesthetic, the patient was placed supine on the operating table.  The right leg was prepped and draped in usual sterile fashion.  Following this, the leg was elevated and the blood pressure inflated to 250 mmHg.  Following this, the wound was explored and the distal portion of the anterior tib tendon could be found proximally, could not find the tendon, and had to extend the wound proximally.  Once I did, I irrigated this thoroughly and  cleaned and opened the sheath a little bit, and then was able to find the tendon.  An end-to-end repair was done with #2-0 FiberWire core stitch with a 4-0 nylon tidying stitch.  Once that was completed, the sheath was closed with 3-0 Vicryl interrupted and care to be taken not to put a constriction on this bulbous tendon repair.  Once this was completed, the wounds were irrigated thoroughly with pulsatile lavage, irrigation, and suctioned dry, and then we closed the wounds with complex closure with deep and superficial closure of a 3 cm anterior wound.  Once this was done, a dorsiflexion splint was applied, and the patient was taken to recovery room, where he was noted be in satisfactory condition. He will need to be nonweightbearing for at least a month.  Estimated blood loss for the procedure was minimal.  Complications were none.     Alta Corning, M.D.     Corliss Skains  D:  02/10/2016  T:  02/11/2016  Job:  [id number]

## 2016-02-25 DIAGNOSIS — S81811D Laceration without foreign body, right lower leg, subsequent encounter: Secondary | ICD-10-CM | POA: Diagnosis not present

## 2018-05-13 ENCOUNTER — Emergency Department (HOSPITAL_COMMUNITY): Payer: Medicare Other

## 2018-05-13 ENCOUNTER — Encounter (HOSPITAL_COMMUNITY): Payer: Self-pay | Admitting: Emergency Medicine

## 2018-05-13 ENCOUNTER — Inpatient Hospital Stay (HOSPITAL_COMMUNITY)
Admission: EM | Admit: 2018-05-13 | Discharge: 2018-06-09 | DRG: 207 | Disposition: A | Discharge status: Skilled Nursing Facility | Payer: Medicare Other | Attending: Internal Medicine | Admitting: Internal Medicine

## 2018-05-13 DIAGNOSIS — F10931 Alcohol use, unspecified with withdrawal delirium: Secondary | ICD-10-CM | POA: Clinically undetermined

## 2018-05-13 DIAGNOSIS — E222 Syndrome of inappropriate secretion of antidiuretic hormone: Secondary | ICD-10-CM | POA: Diagnosis present

## 2018-05-13 DIAGNOSIS — Z781 Physical restraint status: Secondary | ICD-10-CM

## 2018-05-13 DIAGNOSIS — Z880 Allergy status to penicillin: Secondary | ICD-10-CM

## 2018-05-13 DIAGNOSIS — F1010 Alcohol abuse, uncomplicated: Secondary | ICD-10-CM | POA: Diagnosis present

## 2018-05-13 DIAGNOSIS — Z23 Encounter for immunization: Secondary | ICD-10-CM

## 2018-05-13 DIAGNOSIS — F1123 Opioid dependence with withdrawal: Secondary | ICD-10-CM | POA: Diagnosis present

## 2018-05-13 DIAGNOSIS — F419 Anxiety disorder, unspecified: Secondary | ICD-10-CM | POA: Diagnosis present

## 2018-05-13 DIAGNOSIS — E440 Moderate protein-calorie malnutrition: Secondary | ICD-10-CM

## 2018-05-13 DIAGNOSIS — R579 Shock, unspecified: Secondary | ICD-10-CM

## 2018-05-13 DIAGNOSIS — F17210 Nicotine dependence, cigarettes, uncomplicated: Secondary | ICD-10-CM | POA: Diagnosis present

## 2018-05-13 DIAGNOSIS — S272XXA Traumatic hemopneumothorax, initial encounter: Secondary | ICD-10-CM | POA: Diagnosis present

## 2018-05-13 DIAGNOSIS — Z978 Presence of other specified devices: Secondary | ICD-10-CM

## 2018-05-13 DIAGNOSIS — E876 Hypokalemia: Secondary | ICD-10-CM | POA: Diagnosis not present

## 2018-05-13 DIAGNOSIS — W19XXXA Unspecified fall, initial encounter: Secondary | ICD-10-CM

## 2018-05-13 DIAGNOSIS — J440 Chronic obstructive pulmonary disease with acute lower respiratory infection: Secondary | ICD-10-CM | POA: Diagnosis present

## 2018-05-13 DIAGNOSIS — Z79891 Long term (current) use of opiate analgesic: Secondary | ICD-10-CM

## 2018-05-13 DIAGNOSIS — S270XXA Traumatic pneumothorax, initial encounter: Secondary | ICD-10-CM | POA: Diagnosis present

## 2018-05-13 DIAGNOSIS — Z853 Personal history of malignant neoplasm of breast: Secondary | ICD-10-CM

## 2018-05-13 DIAGNOSIS — Z951 Presence of aortocoronary bypass graft: Secondary | ICD-10-CM

## 2018-05-13 DIAGNOSIS — W01190A Fall on same level from slipping, tripping and stumbling with subsequent striking against furniture, initial encounter: Secondary | ICD-10-CM | POA: Diagnosis present

## 2018-05-13 DIAGNOSIS — Z79899 Other long term (current) drug therapy: Secondary | ICD-10-CM

## 2018-05-13 DIAGNOSIS — F10231 Alcohol dependence with withdrawal delirium: Secondary | ICD-10-CM | POA: Diagnosis present

## 2018-05-13 DIAGNOSIS — R0602 Shortness of breath: Secondary | ICD-10-CM

## 2018-05-13 DIAGNOSIS — G9341 Metabolic encephalopathy: Secondary | ICD-10-CM | POA: Diagnosis present

## 2018-05-13 DIAGNOSIS — R54 Age-related physical debility: Secondary | ICD-10-CM | POA: Diagnosis present

## 2018-05-13 DIAGNOSIS — R0603 Acute respiratory distress: Secondary | ICD-10-CM

## 2018-05-13 DIAGNOSIS — E861 Hypovolemia: Secondary | ICD-10-CM | POA: Diagnosis present

## 2018-05-13 DIAGNOSIS — Z955 Presence of coronary angioplasty implant and graft: Secondary | ICD-10-CM

## 2018-05-13 DIAGNOSIS — Z515 Encounter for palliative care: Secondary | ICD-10-CM

## 2018-05-13 DIAGNOSIS — A419 Sepsis, unspecified organism: Secondary | ICD-10-CM | POA: Diagnosis present

## 2018-05-13 DIAGNOSIS — E512 Wernicke's encephalopathy: Secondary | ICD-10-CM | POA: Diagnosis present

## 2018-05-13 DIAGNOSIS — J9600 Acute respiratory failure, unspecified whether with hypoxia or hypercapnia: Secondary | ICD-10-CM

## 2018-05-13 DIAGNOSIS — E871 Hypo-osmolality and hyponatremia: Secondary | ICD-10-CM | POA: Diagnosis present

## 2018-05-13 DIAGNOSIS — Z681 Body mass index (BMI) 19 or less, adult: Secondary | ICD-10-CM

## 2018-05-13 DIAGNOSIS — E785 Hyperlipidemia, unspecified: Secondary | ICD-10-CM | POA: Diagnosis present

## 2018-05-13 DIAGNOSIS — S2249XA Multiple fractures of ribs, unspecified side, initial encounter for closed fracture: Secondary | ICD-10-CM | POA: Diagnosis present

## 2018-05-13 DIAGNOSIS — Z452 Encounter for adjustment and management of vascular access device: Secondary | ICD-10-CM

## 2018-05-13 DIAGNOSIS — R410 Disorientation, unspecified: Secondary | ICD-10-CM

## 2018-05-13 DIAGNOSIS — Z9289 Personal history of other medical treatment: Secondary | ICD-10-CM

## 2018-05-13 DIAGNOSIS — J189 Pneumonia, unspecified organism: Secondary | ICD-10-CM | POA: Diagnosis present

## 2018-05-13 DIAGNOSIS — E8770 Fluid overload, unspecified: Secondary | ICD-10-CM | POA: Diagnosis not present

## 2018-05-13 DIAGNOSIS — J90 Pleural effusion, not elsewhere classified: Secondary | ICD-10-CM | POA: Diagnosis present

## 2018-05-13 DIAGNOSIS — N390 Urinary tract infection, site not specified: Secondary | ICD-10-CM | POA: Diagnosis not present

## 2018-05-13 DIAGNOSIS — J942 Hemothorax: Secondary | ICD-10-CM

## 2018-05-13 DIAGNOSIS — R0600 Dyspnea, unspecified: Secondary | ICD-10-CM

## 2018-05-13 DIAGNOSIS — J9601 Acute respiratory failure with hypoxia: Secondary | ICD-10-CM | POA: Diagnosis present

## 2018-05-13 DIAGNOSIS — J939 Pneumothorax, unspecified: Secondary | ICD-10-CM

## 2018-05-13 DIAGNOSIS — Z7982 Long term (current) use of aspirin: Secondary | ICD-10-CM

## 2018-05-13 DIAGNOSIS — Z09 Encounter for follow-up examination after completed treatment for conditions other than malignant neoplasm: Secondary | ICD-10-CM

## 2018-05-13 DIAGNOSIS — J9811 Atelectasis: Secondary | ICD-10-CM | POA: Diagnosis present

## 2018-05-13 DIAGNOSIS — G9340 Encephalopathy, unspecified: Secondary | ICD-10-CM

## 2018-05-13 DIAGNOSIS — Y92009 Unspecified place in unspecified non-institutional (private) residence as the place of occurrence of the external cause: Secondary | ICD-10-CM

## 2018-05-13 DIAGNOSIS — Z4659 Encounter for fitting and adjustment of other gastrointestinal appliance and device: Secondary | ICD-10-CM

## 2018-05-13 DIAGNOSIS — E7800 Pure hypercholesterolemia, unspecified: Secondary | ICD-10-CM | POA: Diagnosis present

## 2018-05-13 DIAGNOSIS — I2510 Atherosclerotic heart disease of native coronary artery without angina pectoris: Secondary | ICD-10-CM | POA: Diagnosis present

## 2018-05-13 DIAGNOSIS — Z8546 Personal history of malignant neoplasm of prostate: Secondary | ICD-10-CM

## 2018-05-13 DIAGNOSIS — R1310 Dysphagia, unspecified: Secondary | ICD-10-CM | POA: Diagnosis present

## 2018-05-13 DIAGNOSIS — Z8673 Personal history of transient ischemic attack (TIA), and cerebral infarction without residual deficits: Secondary | ICD-10-CM

## 2018-05-13 DIAGNOSIS — S27329A Contusion of lung, unspecified, initial encounter: Secondary | ICD-10-CM | POA: Diagnosis present

## 2018-05-13 DIAGNOSIS — S2241XA Multiple fractures of ribs, right side, initial encounter for closed fracture: Secondary | ICD-10-CM

## 2018-05-13 MED ORDER — SODIUM CHLORIDE 0.9 % IV BOLUS
500.0000 mL | Freq: Once | INTRAVENOUS | Status: AC
  Administered 2018-05-14: 500 mL via INTRAVENOUS

## 2018-05-13 MED ORDER — HYDROMORPHONE HCL 1 MG/ML IJ SOLN
1.0000 mg | Freq: Once | INTRAMUSCULAR | Status: AC
  Administered 2018-05-14: 1 mg via INTRAVENOUS
  Filled 2018-05-13: qty 1

## 2018-05-13 NOTE — ED Provider Notes (Signed)
[facility] EMERGENCY DEPARTMENT Provider Note  CSN: [id number] Arrival date & time: 05/13/18 2314  Chief Complaint(s) Fall  HPI [name] Pace is a 67 y.o. male with extensive past medical history listed below including coronary artery disease, prostate cancer, prior stroke, alcohol abuse but patient denies drinking every single day.  He presents to the emergency department with right lateral chest wall pain.  He reports that he had a mechanical fall yesterday afternoon, resulting in the right chest wall trauma.  He states that he hit his entertainment center on the way down.  He immediately felt right-sided chest pain but thought it would go away eventually.  He reports that the pain continually got worse and is now so severe he can barely move.  He has tried taking Tylenol for pain.  Pain is exacerbated with movement, palpation of the right lateral wall, deep breathing.  Alleviated only by immobility.  Denies any head trauma, neck pain, back pain, hip pain, upper extremity pain, lower extremity pain.  HPI  Past Medical History Past Medical History:  Diagnosis Date  . Alcohol abuse   . Anxiety   . Breast cancer ([place]) Left breast   Per patient diagnosed in early 86's.   . Coronary artery disease   . High cholesterol   . Prostate cancer ([place])    per patient diagnosed in early 42's  . Stroke [facility])    Patient Active Problem List   Diagnosis Date Noted  . Multiple rib fractures 05/14/2018  . Pneumothorax, closed, traumatic, initial encounter 05/14/2018  . Laceration of muscle(s) and tendon(s) of anterior muscle group at lower leg level, right leg, initial encounter 02/10/2016  . Traumatic rupture of right anterior tibial tendon 02/10/2016  . Hip fracture, left ([place]) 07/02/2014  . Closed left hip fracture ([place]) 07/02/2014  . Hip fracture ([place]) 07/02/2014  . Intertrochanteric fracture of left femur ([place]) 07/02/2014  . History of stroke   . Preop examination   .  Subclavian artery stenosis, left ([place]) 06/15/2014  . Chest pain 06/14/2014  . CAP (community acquired pneumonia) 06/14/2014  . Hyponatremia 06/14/2014  . Sepsis ([place]) 06/14/2014  . Alcohol abuse 06/14/2014  . CAD (coronary artery disease) 06/14/2014  . Tobacco abuse 06/14/2014   Home Medication(s) Prior to Admission medications   Medication Sig Start Date End Date Taking? Authorizing Provider  ALPRAZolam Duanne Moron) 1 MG tablet Take 1 mg by mouth 3 (three) times daily.  11/19/13   [provider]  aspirin EC 325 MG EC tablet Take 1 tablet (325 mg total) by mouth 2 (two) times daily with a meal. 07/04/14   Debbe Odea, MD  atorvastatin (LIPITOR) 40 MG tablet Take 40 mg by mouth daily.    [provider]  doxycycline (VIBRA-TABS) 100 MG tablet Take 1 tablet (100 mg total) by mouth 2 (two) times daily. 02/10/16   Izaiyah Fleet, PA-C  oxyCODONE-acetaminophen (PERCOCET) 10-325 MG tablet Take 1-2 tablets by mouth every 6 (six) hours as needed for pain. 02/10/16   Zavier Fleet, PA-C  Past Surgical History Past Surgical History:  Procedure Laterality Date  . CAROTID STENT    . CORONARY ARTERY BYPASS GRAFT    . FEMUR IM NAIL Left 07/02/2014   Procedure: INTRAMEDULLARY (IM) NAIL FEMORAL;  Surgeon: Elie Goody, MD;  Location: [facility];  Service: Orthopedics;  Laterality: Left;  . I&D EXTREMITY Right 02/10/2016   Procedure: IRRIGATION AND DEBRIDEMENT EXTREMITY;  Surgeon: Dorna Leitz, MD;  Location: [place];  Service: Orthopedics;  Laterality: Right;  . TENDON REPAIR Right 02/10/2016   Procedure: Anterior TENDON REPAIR and wound exploration.;  Surgeon: Dorna Leitz, MD;  Location: [place];  Service: Orthopedics;  Laterality: Right;   Family History Family History  Problem Relation Age of Onset  . Dementia Mother     Social History Social History    Tobacco Use  . Smoking status: Current Every Day Smoker    Packs/day: 1.00    Years: 42.00    Pack years: 42.00    Types: Cigarettes  . Smokeless tobacco: Never Used  Substance Use Topics  . Alcohol use: Yes    Comment: 4-5 beers a day  . Drug use: No   Allergies Penicillins  Review of Systems Review of Systems All other systems are reviewed and are negative for acute change except as noted in the HPI  Physical Exam Vital Signs  I have reviewed the triage vital signs BP (!) 143/72 (BP Location: Right Arm)   Pulse 82   Temp 97.7 F (36.5 C) (Oral)   Resp 18   Ht 6\' 4"  (1.93 m)   Wt 72.6 kg   SpO2 99%   BMI 19.48 kg/m   Physical Exam  Constitutional: He is oriented to person, place, and time. He appears well-developed and well-nourished. No distress.  HENT:  Head: Normocephalic.  Right Ear: External ear normal.  Left Ear: External ear normal.  Mouth/Throat: Oropharynx is clear and moist.  Eyes: Pupils are equal, round, and reactive to light. Conjunctivae and EOM are normal. Right eye exhibits no discharge. Left eye exhibits no discharge. No scleral icterus.  Neck: Normal range of motion. Neck supple.  Cardiovascular: Regular rhythm and normal heart sounds. Exam reveals no gallop and no friction rub.  No murmur heard. Pulses:      Radial pulses are 2+ on the right side, and 2+ on the left side.       Dorsalis pedis pulses are 2+ on the right side, and 2+ on the left side.  Pulmonary/Chest: Effort normal and breath sounds normal. No stridor. No respiratory distress.     He exhibits tenderness.    Abdominal: Soft. He exhibits no distension. There is no tenderness. There is no rigidity, no rebound and no guarding.    Musculoskeletal:       Cervical back: He exhibits no bony tenderness.       Thoracic back: He exhibits no bony tenderness.       Lumbar back: He exhibits no bony tenderness.  Clavicle stable. Chest stable to AP/Lat compression. Pelvis stable  to Lat compression. No obvious extremity deformity. No chest or abdominal wall contusion.  Neurological: He is alert and oriented to person, place, and time. GCS eye subscore is 4. GCS verbal subscore is 5. GCS motor subscore is 6.  Moving all extremities   Skin: Skin is warm. He is not diaphoretic.    ED Results and Treatments Labs (all labs ordered are listed, but only abnormal results are displayed) Labs Reviewed  CBC WITH DIFFERENTIAL/PLATELET -  Abnormal; Notable for the following components:      Result Value   WBC 13.5 (*)    RBC 4.17 (*)    MCV 102.2 (*)    MCH 35.7 (*)    Neutro Abs 11.4 (*)    Abs Immature Granulocytes 0.09 (*)    All other components within normal limits  BASIC METABOLIC PANEL - Abnormal; Notable for the following components:   Sodium 123 (*)    Chloride 88 (*)    CO2 19 (*)    Glucose, Bld 110 (*)    BUN <5 (*)    Creatinine, Ser 0.52 (*)    Calcium 8.1 (*)    Anion gap 16 (*)    All other components within normal limits                                                                                                                         EKG  EKG Interpretation  Date/Time:    Ventricular Rate:    PR Interval:    QRS Duration:   QT Interval:    QTC Calculation:   R Axis:     Text Interpretation:        Radiology Ct Chest Wo Contrast  Result Date: 05/14/2018 CLINICAL DATA:  67 year old male with fall and trauma to the right chest wall. EXAM: CT CHEST WITHOUT CONTRAST TECHNIQUE: Multidetector CT imaging of the chest was performed following the standard protocol without IV contrast. COMPARISON:  Chest radiograph dated 07/02/2014 FINDINGS: Evaluation of this exam is limited in the absence of intravenous contrast. Cardiovascular: Top-normal cardiac size. No pericardial effusion. Multi vessel coronary vascular calcification and postsurgical changes of CABG. A 2.3 x 2.1 cm rounded structure in the anterior mediastinum anterior to the pulmonary  trunk (series 3, image 70) is not characterized on this noncontrast CT but most likely represents a saccular aneurysm. This can be further evaluated with contrast enhanced CT. There is mild atherosclerotic calcification of the thoracic aorta. The central pulmonary arteries are grossly unremarkable. A left subclavian stent is not well evaluated on this noncontrast CT. Mediastinum/Nodes: No hilar or mediastinal adenopathy. Small hiatal hernia. No mediastinal fluid collection. Lungs/Pleura: There is a background of emphysema. Minimal bibasilar atelectatic changes. There is a trace right pleural effusion. Minimal pneumothorax in the right apex measures 3 mm in thickness. No focal consolidation. Small amount of mucus material noted in the central airway. The airways are otherwise patent. Upper Abdomen: Cholecystectomy. The visualized upper abdomen is otherwise unremarkable. Musculoskeletal: Multiple mildly displaced right rib fractures involving the right 5th-11th ribs. There is a small amount of chest wall emphysema. Right scapular fixation screws. IMPRESSION: 1. Multiple mildly displaced right rib fractures involving the right 5th-11th ribs. 2. Minimal right apical pneumothorax. 3. Rounded structure in the anterior mediastinum, likely a saccular aneurysm. This can be further evaluated with contrast enhanced CT on a nonemergent basis. These results were called by telephone at the time of interpretation on 05/14/2018 at  12:18 am to Dr. Addison Lank , who verbally acknowledged these results. Electronically Signed   By: Anner Crete M.D.   On: 05/14/2018 00:20   Pertinent labs & imaging results that were available during my care of the patient were reviewed by me and considered in my medical decision making (see chart for details).  Medications Ordered in ED Medications  sodium chloride 0.9 % bolus 500 mL (500 mLs Intravenous New Bag/Given 05/14/18 0051)  sodium chloride 0.9 % bolus 1,000 mL (has no  administration in time range)  HYDROmorphone (DILAUDID) injection 1 mg (has no administration in time range)  HYDROmorphone (DILAUDID) injection 1 mg (1 mg Intravenous Given 05/14/18 0054)  ondansetron (ZOFRAN) 4 MG/2ML injection (4 mg  Given 05/14/18 0054)                                                                                                                                    Procedures .Critical Care Performed by: Fatima Blank, MD Authorized by: Fatima Blank, MD   Critical care provider statement:    Critical care time (minutes):  45   Critical care start time:  05/14/2018 12:10 AM   Critical care end time:  05/14/2018 1:44 AM   Critical care was necessary to treat or prevent imminent or life-threatening deterioration of the following conditions:  Metabolic crisis   Critical care was time spent personally by me on the following activities:  Discussions with consultants, evaluation of patient's response to treatment, examination of patient, ordering and performing treatments and interventions, ordering and review of laboratory studies, ordering and review of radiographic studies, pulse oximetry, re-evaluation of patient's condition, obtaining history from patient or surrogate and review of old charts   Emergency Ultrasound Study:   Angiocath insertion Performed by: Fatima Blank  Consent: Verbal consent obtained. Risks and benefits: risks, benefits and alternatives were discussed Immediately prior to procedure the correct patient, procedure, equipment, support staff and site/side marked as needed.  Indication: difficult IV access Preparation: Patient and probe were prepped with chlorhexidine or alcohol. Sterile gel used. Vein Location: left cephalic vein was visualized during assessment for potential access sites and was found to be patent/ easily compressed with linear ultrasound.  1 inch, 18 gauge needle was visualized with real-time ultrasound and  guided into the vein.  Image saved and stored.  Normal blood return.  Patient tolerance: Patient tolerated the procedure well with no immediate complications.  (including critical care time)  Medical Decision Making / ED Course I have reviewed the nursing notes for this encounter and the patient's prior records (if available in EHR or on provided paperwork).    Mechanical fall resulting in right lateral chest wall trauma.  ABCs intact. Secondary as above Patient required ultrasound-guided IV. Provided with IV pain medicine CT of the chest was obtained and notable for numerous right rib fractures, 5 through 11.  Also notable for subcutaneous air  and a small apical right pneumothorax versus bleb. No consolidation concerning for pneumonia. Patient saturations dropped to low 80s on room air.  Likely multifactorial from patient's underlying COPD with decreased air movement due to pain.  Placed on 3 L nasal cannula. No other injuries noted on exam requiring work-up.   Screening labs obtain notable for leukocytosis likely from demargination.  Metabolic panel notable for hyponatremia and likely alcoholic potomania.   Case discussed with Dr. Windle Guard from trauma surgery who will assist in the management of the rib fractures.  Medicine will admit the patient for management of the hyponatremia and comorbidities.  Final Clinical Impression(s) / ED Diagnoses Final diagnoses:  Fall, initial encounter  Multiple fractures of ribs, right side, initial encounter for closed fracture  Closed traumatic fracture of ribs of right side with pneumothorax, initial encounter  Hyponatremia      This chart was dictated using voice recognition software.  Despite best efforts to proofread,  errors can occur which can change the documentation meaning.   Fatima Blank, MD 05/14/18 [phone]

## 2018-05-13 NOTE — ED Triage Notes (Signed)
Pt transported from home by [facility] from home after fall yesterday hitting R ribcage on nightstand, abrasions and pain to area. Lung sound present throughout.  Unable to obtain IV, Fentanyl 154mcg intra nasal, Zofran 4mg  IM given.

## 2018-05-14 ENCOUNTER — Other Ambulatory Visit: Payer: Self-pay

## 2018-05-14 ENCOUNTER — Encounter (HOSPITAL_COMMUNITY): Payer: Self-pay | Admitting: Family Medicine

## 2018-05-14 ENCOUNTER — Inpatient Hospital Stay (HOSPITAL_COMMUNITY): Payer: Medicare Other

## 2018-05-14 DIAGNOSIS — Y92009 Unspecified place in unspecified non-institutional (private) residence as the place of occurrence of the external cause: Secondary | ICD-10-CM | POA: Diagnosis not present

## 2018-05-14 DIAGNOSIS — Z681 Body mass index (BMI) 19 or less, adult: Secondary | ICD-10-CM | POA: Diagnosis not present

## 2018-05-14 DIAGNOSIS — I2510 Atherosclerotic heart disease of native coronary artery without angina pectoris: Secondary | ICD-10-CM

## 2018-05-14 DIAGNOSIS — S2241XA Multiple fractures of ribs, right side, initial encounter for closed fracture: Secondary | ICD-10-CM | POA: Diagnosis present

## 2018-05-14 DIAGNOSIS — F10231 Alcohol dependence with withdrawal delirium: Secondary | ICD-10-CM | POA: Diagnosis present

## 2018-05-14 DIAGNOSIS — W01190A Fall on same level from slipping, tripping and stumbling with subsequent striking against furniture, initial encounter: Secondary | ICD-10-CM | POA: Diagnosis not present

## 2018-05-14 DIAGNOSIS — Z515 Encounter for palliative care: Secondary | ICD-10-CM | POA: Diagnosis present

## 2018-05-14 DIAGNOSIS — A419 Sepsis, unspecified organism: Secondary | ICD-10-CM | POA: Diagnosis present

## 2018-05-14 DIAGNOSIS — E785 Hyperlipidemia, unspecified: Secondary | ICD-10-CM | POA: Diagnosis present

## 2018-05-14 DIAGNOSIS — G9341 Metabolic encephalopathy: Secondary | ICD-10-CM | POA: Diagnosis present

## 2018-05-14 DIAGNOSIS — F419 Anxiety disorder, unspecified: Secondary | ICD-10-CM | POA: Diagnosis present

## 2018-05-14 DIAGNOSIS — E440 Moderate protein-calorie malnutrition: Secondary | ICD-10-CM | POA: Diagnosis present

## 2018-05-14 DIAGNOSIS — E222 Syndrome of inappropriate secretion of antidiuretic hormone: Secondary | ICD-10-CM | POA: Diagnosis present

## 2018-05-14 DIAGNOSIS — S272XXA Traumatic hemopneumothorax, initial encounter: Secondary | ICD-10-CM | POA: Diagnosis present

## 2018-05-14 DIAGNOSIS — J9811 Atelectasis: Secondary | ICD-10-CM | POA: Diagnosis present

## 2018-05-14 DIAGNOSIS — E871 Hypo-osmolality and hyponatremia: Secondary | ICD-10-CM | POA: Diagnosis present

## 2018-05-14 DIAGNOSIS — J440 Chronic obstructive pulmonary disease with acute lower respiratory infection: Secondary | ICD-10-CM | POA: Diagnosis present

## 2018-05-14 DIAGNOSIS — S27329A Contusion of lung, unspecified, initial encounter: Secondary | ICD-10-CM | POA: Diagnosis present

## 2018-05-14 DIAGNOSIS — S2249XA Multiple fractures of ribs, unspecified side, initial encounter for closed fracture: Secondary | ICD-10-CM | POA: Insufficient documentation

## 2018-05-14 DIAGNOSIS — J9600 Acute respiratory failure, unspecified whether with hypoxia or hypercapnia: Secondary | ICD-10-CM | POA: Diagnosis not present

## 2018-05-14 DIAGNOSIS — Z8673 Personal history of transient ischemic attack (TIA), and cerebral infarction without residual deficits: Secondary | ICD-10-CM

## 2018-05-14 DIAGNOSIS — J189 Pneumonia, unspecified organism: Secondary | ICD-10-CM | POA: Diagnosis present

## 2018-05-14 DIAGNOSIS — J13 Pneumonia due to Streptococcus pneumoniae: Secondary | ICD-10-CM | POA: Diagnosis not present

## 2018-05-14 DIAGNOSIS — F1123 Opioid dependence with withdrawal: Secondary | ICD-10-CM | POA: Diagnosis present

## 2018-05-14 DIAGNOSIS — E7800 Pure hypercholesterolemia, unspecified: Secondary | ICD-10-CM | POA: Diagnosis present

## 2018-05-14 DIAGNOSIS — I370 Nonrheumatic pulmonary valve stenosis: Secondary | ICD-10-CM | POA: Diagnosis not present

## 2018-05-14 DIAGNOSIS — S270XXA Traumatic pneumothorax, initial encounter: Secondary | ICD-10-CM | POA: Diagnosis not present

## 2018-05-14 DIAGNOSIS — R579 Shock, unspecified: Secondary | ICD-10-CM | POA: Diagnosis present

## 2018-05-14 DIAGNOSIS — J90 Pleural effusion, not elsewhere classified: Secondary | ICD-10-CM | POA: Diagnosis present

## 2018-05-14 DIAGNOSIS — F1010 Alcohol abuse, uncomplicated: Secondary | ICD-10-CM | POA: Diagnosis not present

## 2018-05-14 DIAGNOSIS — Z23 Encounter for immunization: Secondary | ICD-10-CM | POA: Diagnosis present

## 2018-05-14 DIAGNOSIS — N390 Urinary tract infection, site not specified: Secondary | ICD-10-CM | POA: Diagnosis not present

## 2018-05-14 DIAGNOSIS — R410 Disorientation, unspecified: Secondary | ICD-10-CM | POA: Diagnosis not present

## 2018-05-14 DIAGNOSIS — E512 Wernicke's encephalopathy: Secondary | ICD-10-CM | POA: Diagnosis present

## 2018-05-14 DIAGNOSIS — G9340 Encephalopathy, unspecified: Secondary | ICD-10-CM | POA: Diagnosis not present

## 2018-05-14 DIAGNOSIS — J9601 Acute respiratory failure with hypoxia: Secondary | ICD-10-CM | POA: Diagnosis present

## 2018-05-14 DIAGNOSIS — I361 Nonrheumatic tricuspid (valve) insufficiency: Secondary | ICD-10-CM | POA: Diagnosis not present

## 2018-05-14 DIAGNOSIS — W19XXXA Unspecified fall, initial encounter: Secondary | ICD-10-CM

## 2018-05-14 LAB — CBC WITH DIFFERENTIAL/PLATELET
ABS IMMATURE GRANULOCYTES: 0.09 10*3/uL — AB (ref 0.00–0.07)
Abs Immature Granulocytes: 0.04 10*3/uL (ref 0.00–0.07)
Basophils Absolute: 0 10*3/uL (ref 0.0–0.1)
Basophils Absolute: 0 10*3/uL (ref 0.0–0.1)
Basophils Relative: 0 %
Basophils Relative: 0 %
Eosinophils Absolute: 0 10*3/uL (ref 0.0–0.5)
Eosinophils Absolute: 0.1 10*3/uL (ref 0.0–0.5)
Eosinophils Relative: 0 %
Eosinophils Relative: 1 %
HCT: 40.1 % (ref 39.0–52.0)
HEMATOCRIT: 42.6 % (ref 39.0–52.0)
Hemoglobin: 14 g/dL (ref 13.0–17.0)
Hemoglobin: 14.9 g/dL (ref 13.0–17.0)
IMMATURE GRANULOCYTES: 1 %
Immature Granulocytes: 0 %
LYMPHS ABS: 0.9 10*3/uL (ref 0.7–4.0)
Lymphocytes Relative: 10 %
Lymphocytes Relative: 7 %
Lymphs Abs: 1 10*3/uL (ref 0.7–4.0)
MCH: 34.7 pg — ABNORMAL HIGH (ref 26.0–34.0)
MCH: 35.7 pg — ABNORMAL HIGH (ref 26.0–34.0)
MCHC: 34.9 g/dL (ref 30.0–36.0)
MCHC: 35 g/dL (ref 30.0–36.0)
MCV: 102.2 fL — AB (ref 80.0–100.0)
MCV: 99.3 fL (ref 80.0–100.0)
MONOS PCT: 8 %
Monocytes Absolute: 0.8 10*3/uL (ref 0.1–1.0)
Monocytes Absolute: 1 10*3/uL (ref 0.1–1.0)
Monocytes Relative: 7 %
Neutro Abs: 11.4 10*3/uL — ABNORMAL HIGH (ref 1.7–7.7)
Neutro Abs: 8.9 10*3/uL — ABNORMAL HIGH (ref 1.7–7.7)
Neutrophils Relative %: 82 %
Neutrophils Relative %: 84 %
PLATELETS: 203 10*3/uL (ref 150–400)
Platelets: 239 10*3/uL (ref 150–400)
RBC: 4.04 MIL/uL — ABNORMAL LOW (ref 4.22–5.81)
RBC: 4.17 MIL/uL — ABNORMAL LOW (ref 4.22–5.81)
RDW: 11.7 % (ref 11.5–15.5)
RDW: 11.8 % (ref 11.5–15.5)
WBC: 10.9 10*3/uL — ABNORMAL HIGH (ref 4.0–10.5)
WBC: 13.5 10*3/uL — AB (ref 4.0–10.5)
nRBC: 0 % (ref 0.0–0.2)
nRBC: 0 % (ref 0.0–0.2)

## 2018-05-14 LAB — BASIC METABOLIC PANEL
ANION GAP: 16 — AB (ref 5–15)
Anion gap: 12 (ref 5–15)
Anion gap: 11 (ref 5–15)
Anion gap: 12 (ref 5–15)
BUN: 5 mg/dL — ABNORMAL LOW (ref 8–23)
BUN: 5 mg/dL — ABNORMAL LOW (ref 8–23)
BUN: <5 mg/dL — ABNORMAL LOW (ref 8–23)
BUN: <5 mg/dL — ABNORMAL LOW (ref 8–23)
CO2: 19 mmol/L — AB (ref 22–32)
CO2: 23 mmol/L (ref 22–32)
CO2: 24 mmol/L (ref 22–32)
CO2: 24 mmol/L (ref 22–32)
CREATININE: 0.64 mg/dL (ref 0.61–1.24)
CREATININE: 0.69 mg/dL (ref 0.61–1.24)
Calcium: 7.9 mg/dL — ABNORMAL LOW (ref 8.9–10.3)
Calcium: 8.1 mg/dL — ABNORMAL LOW (ref 8.9–10.3)
Calcium: 8.3 mg/dL — ABNORMAL LOW (ref 8.9–10.3)
Calcium: 8.7 mg/dL — ABNORMAL LOW (ref 8.9–10.3)
Chloride: 88 mmol/L — ABNORMAL LOW (ref 98–111)
Chloride: 89 mmol/L — ABNORMAL LOW (ref 98–111)
Chloride: 90 mmol/L — ABNORMAL LOW (ref 98–111)
Chloride: 90 mmol/L — ABNORMAL LOW (ref 98–111)
Creatinine, Ser: 0.52 mg/dL — ABNORMAL LOW (ref 0.61–1.24)
Creatinine, Ser: 0.66 mg/dL (ref 0.61–1.24)
GFR calc Af Amer: >60 mL/min (ref >60)
GFR calc Af Amer: >60 mL/min (ref >60)
GFR calc Af Amer: >60 mL/min (ref >60)
GFR calc non Af Amer: >60 mL/min (ref >60)
GFR calc non Af Amer: >60 mL/min (ref >60)
GFR calc non Af Amer: >60 mL/min (ref >60)
GLUCOSE: 108 mg/dL — AB (ref 70–99)
GLUCOSE: 110 mg/dL — AB (ref 70–99)
GLUCOSE: 99 mg/dL (ref 70–99)
Glucose, Bld: 111 mg/dL — ABNORMAL HIGH (ref 70–99)
Potassium: 3.7 mmol/L (ref 3.5–5.1)
Potassium: 3.7 mmol/L (ref 3.5–5.1)
Potassium: 4 mmol/L (ref 3.5–5.1)
Potassium: 4.1 mmol/L (ref 3.5–5.1)
Sodium: 123 mmol/L — ABNORMAL LOW (ref 135–145)
Sodium: 125 mmol/L — ABNORMAL LOW (ref 135–145)
Sodium: 125 mmol/L — ABNORMAL LOW (ref 135–145)
Sodium: 125 mmol/L — ABNORMAL LOW (ref 135–145)

## 2018-05-14 LAB — OSMOLALITY, URINE: Osmolality, Ur: 422 mOsm/kg (ref 300–900)

## 2018-05-14 LAB — HEPATIC FUNCTION PANEL
ALT: 32 U/L (ref 0–44)
AST: 34 U/L (ref 15–41)
Albumin: 3 g/dL — ABNORMAL LOW (ref 3.5–5.0)
Alkaline Phosphatase: 57 U/L (ref 38–126)
BILIRUBIN DIRECT: 0.3 mg/dL — AB (ref 0.0–0.2)
BILIRUBIN TOTAL: 1.4 mg/dL — AB (ref 0.3–1.2)
Indirect Bilirubin: 1.1 mg/dL — ABNORMAL HIGH (ref 0.3–0.9)
Total Protein: 6.1 g/dL — ABNORMAL LOW (ref 6.5–8.1)

## 2018-05-14 LAB — SODIUM, URINE, RANDOM: Sodium, Ur: <10 mmol/L

## 2018-05-14 LAB — HIV ANTIBODY (ROUTINE TESTING W REFLEX): HIV Screen 4th Generation wRfx: NONREACTIVE

## 2018-05-14 LAB — CK: Total CK: 218 U/L (ref 49–397)

## 2018-05-14 MED ORDER — ATORVASTATIN CALCIUM 40 MG PO TABS
40.0000 mg | ORAL_TABLET | Freq: Every day | ORAL | Status: DC
  Administered 2018-05-15: 40 mg via ORAL
  Filled 2018-05-14: qty 1

## 2018-05-14 MED ORDER — THIAMINE HCL 100 MG/ML IJ SOLN
100.0000 mg | Freq: Every day | INTRAMUSCULAR | Status: DC
  Administered 2018-05-17 – 2018-05-18 (×2): 100 mg via INTRAVENOUS
  Filled 2018-05-14 (×2): qty 2

## 2018-05-14 MED ORDER — METHOCARBAMOL 500 MG PO TABS
500.0000 mg | ORAL_TABLET | Freq: Four times a day (QID) | ORAL | Status: DC | PRN
  Administered 2018-05-14 – 2018-05-15 (×2): 500 mg via ORAL
  Filled 2018-05-14 (×2): qty 1

## 2018-05-14 MED ORDER — INFLUENZA VAC SPLIT HIGH-DOSE 0.5 ML IM SUSY
0.5000 mL | PREFILLED_SYRINGE | INTRAMUSCULAR | Status: AC
  Administered 2018-05-15: 0.5 mL via INTRAMUSCULAR
  Filled 2018-05-14: qty 0.5

## 2018-05-14 MED ORDER — ONDANSETRON HCL 4 MG/2ML IJ SOLN
INTRAMUSCULAR | Status: AC
  Administered 2018-05-14: 4 mg
  Filled 2018-05-14: qty 2

## 2018-05-14 MED ORDER — VITAMIN B-1 100 MG PO TABS
100.0000 mg | ORAL_TABLET | Freq: Every day | ORAL | Status: DC
  Administered 2018-05-14 – 2018-05-20 (×5): 100 mg via ORAL
  Filled 2018-05-14 (×5): qty 1

## 2018-05-14 MED ORDER — PNEUMOCOCCAL VAC POLYVALENT 25 MCG/0.5ML IJ INJ
0.5000 mL | INJECTION | INTRAMUSCULAR | Status: AC
  Administered 2018-05-15: 0.5 mL via INTRAMUSCULAR

## 2018-05-14 MED ORDER — ACETAMINOPHEN 500 MG PO TABS
1000.0000 mg | ORAL_TABLET | Freq: Four times a day (QID) | ORAL | Status: DC
  Administered 2018-05-14 – 2018-05-20 (×17): 1000 mg via ORAL
  Filled 2018-05-14 (×18): qty 2

## 2018-05-14 MED ORDER — SODIUM CHLORIDE 0.9 % IV SOLN
250.0000 mL | INTRAVENOUS | Status: DC | PRN
  Administered 2018-05-18 (×2): via INTRAVENOUS
  Administered 2018-05-19: 250 mL via INTRAVENOUS

## 2018-05-14 MED ORDER — FENTANYL CITRATE (PF) 100 MCG/2ML IJ SOLN
50.0000 ug | INTRAMUSCULAR | Status: DC | PRN
  Administered 2018-05-14: 75 ug via INTRAVENOUS
  Filled 2018-05-14: qty 2

## 2018-05-14 MED ORDER — ONDANSETRON HCL 4 MG PO TABS: 4.0000 mg | ORAL_TABLET | Freq: Four times a day (QID) | ORAL | Status: DC | PRN

## 2018-05-14 MED ORDER — GABAPENTIN 300 MG PO CAPS
300.0000 mg | ORAL_CAPSULE | Freq: Three times a day (TID) | ORAL | Status: DC
  Administered 2018-05-14 – 2018-05-16 (×7): 300 mg via ORAL
  Filled 2018-05-14 (×7): qty 1

## 2018-05-14 MED ORDER — SODIUM CHLORIDE 0.9% FLUSH: 3.0000 mL | INTRAVENOUS | Status: DC | PRN

## 2018-05-14 MED ORDER — OXYCODONE HCL 5 MG PO TABS
5.0000 mg | ORAL_TABLET | ORAL | Status: DC | PRN
  Administered 2018-05-15: 5 mg via ORAL
  Filled 2018-05-14: qty 1

## 2018-05-14 MED ORDER — ACETAMINOPHEN 650 MG RE SUPP: 650.0000 mg | Freq: Four times a day (QID) | RECTAL | Status: DC | PRN

## 2018-05-14 MED ORDER — LORAZEPAM 2 MG/ML IJ SOLN
1.0000 mg | Freq: Four times a day (QID) | INTRAMUSCULAR | Status: DC | PRN
  Administered 2018-05-14: 1 mg via INTRAVENOUS
  Filled 2018-05-14 (×3): qty 1

## 2018-05-14 MED ORDER — IBUPROFEN 800 MG PO TABS
800.0000 mg | ORAL_TABLET | Freq: Three times a day (TID) | ORAL | Status: DC
  Administered 2018-05-14: 800 mg via ORAL
  Filled 2018-05-14: qty 1

## 2018-05-14 MED ORDER — DOCUSATE SODIUM 100 MG PO CAPS
100.0000 mg | ORAL_CAPSULE | Freq: Two times a day (BID) | ORAL | Status: DC
  Administered 2018-05-14 – 2018-05-16 (×6): 100 mg via ORAL
  Filled 2018-05-14 (×6): qty 1

## 2018-05-14 MED ORDER — SODIUM CHLORIDE 0.9 % IV SOLN
INTRAVENOUS | Status: DC
  Administered 2018-05-15 (×2): via INTRAVENOUS

## 2018-05-14 MED ORDER — ADULT MULTIVITAMIN W/MINERALS CH
1.0000 | ORAL_TABLET | Freq: Every day | ORAL | Status: DC
  Administered 2018-05-14 – 2018-05-16 (×3): 1 via ORAL
  Filled 2018-05-14 (×3): qty 1

## 2018-05-14 MED ORDER — ONDANSETRON HCL 4 MG/2ML IJ SOLN
4.0000 mg | Freq: Four times a day (QID) | INTRAMUSCULAR | Status: DC | PRN
  Administered 2018-05-24 – 2018-06-03 (×2): 4 mg via INTRAVENOUS
  Filled 2018-05-14 (×2): qty 2

## 2018-05-14 MED ORDER — LORAZEPAM 2 MG/ML IJ SOLN
0.0000 mg | Freq: Two times a day (BID) | INTRAMUSCULAR | Status: DC
  Administered 2018-05-16: 1 mg via INTRAVENOUS
  Filled 2018-05-14: qty 1

## 2018-05-14 MED ORDER — SODIUM CHLORIDE 0.9% FLUSH
3.0000 mL | Freq: Two times a day (BID) | INTRAVENOUS | Status: DC
  Administered 2018-05-18: 3 mL via INTRAVENOUS

## 2018-05-14 MED ORDER — FOLIC ACID 1 MG PO TABS
1.0000 mg | ORAL_TABLET | Freq: Every day | ORAL | Status: DC
  Administered 2018-05-14 – 2018-05-16 (×3): 1 mg via ORAL
  Filled 2018-05-14 (×3): qty 1

## 2018-05-14 MED ORDER — SODIUM CHLORIDE 0.9% FLUSH
3.0000 mL | Freq: Two times a day (BID) | INTRAVENOUS | Status: DC
  Administered 2018-05-16 – 2018-05-20 (×4): 3 mL via INTRAVENOUS

## 2018-05-14 MED ORDER — POLYETHYLENE GLYCOL 3350 17 G PO PACK: 17.0000 g | PACK | Freq: Every day | ORAL | Status: DC | PRN

## 2018-05-14 MED ORDER — SODIUM CHLORIDE 0.9 % IV BOLUS
1000.0000 mL | Freq: Once | INTRAVENOUS | Status: AC
Start: 2018-05-14 — End: 2018-05-14
  Administered 2018-05-14: 1000 mL via INTRAVENOUS

## 2018-05-14 MED ORDER — LORAZEPAM 1 MG PO TABS
1.0000 mg | ORAL_TABLET | Freq: Four times a day (QID) | ORAL | Status: DC | PRN
  Administered 2018-05-15 – 2018-05-16 (×3): 1 mg via ORAL
  Filled 2018-05-14 (×3): qty 1

## 2018-05-14 MED ORDER — ACETAMINOPHEN 325 MG PO TABS: 650.0000 mg | ORAL_TABLET | Freq: Four times a day (QID) | ORAL | Status: DC | PRN

## 2018-05-14 MED ORDER — LORAZEPAM 2 MG/ML IJ SOLN
0.0000 mg | Freq: Four times a day (QID) | INTRAMUSCULAR | Status: DC
  Administered 2018-05-14: 2 mg via INTRAVENOUS
  Administered 2018-05-14: 1 mg via INTRAVENOUS
  Filled 2018-05-14 (×2): qty 1

## 2018-05-14 MED ORDER — HYDROMORPHONE HCL 1 MG/ML IJ SOLN
1.0000 mg | Freq: Once | INTRAMUSCULAR | Status: AC
  Administered 2018-05-14: 1 mg via INTRAVENOUS
  Filled 2018-05-14: qty 1

## 2018-05-14 NOTE — ED Notes (Signed)
Pt placed on 3L 02 by [place] d/t desat in 80's, pt 02 up to 92%.  MD aware

## 2018-05-14 NOTE — ED Notes (Signed)
IV attempt X2 

## 2018-05-14 NOTE — Plan of Care (Signed)
  Problem: Education: Goal: Knowledge of General Education information will improve Description Including pain rating scale, medication(s)/side effects and non-pharmacologic comfort measures Outcome: Progressing   Problem: Clinical Measurements: Goal: Diagnostic test results will improve Outcome: Progressing   Problem: Clinical Measurements: Goal: Cardiovascular complication will be avoided Outcome: Progressing   Problem: Pain Managment: Goal: General experience of comfort will improve Outcome: Progressing

## 2018-05-14 NOTE — Progress Notes (Signed)
[facility] Progress Note     Subjective: CC-  Patient resting comfortably in bed. Received ativan at 0630 this AM. States that he does continue to have pain from rib fractures. Worse with deep breaths. Denies SOB. Pulling 500 on IS. Denies abdominal pain, nausea, vomiting.  Lives in apartment independently. Does not use assistive device for ambulation.  Drinks about 6 beers daily, and smokes 1/2 PPD.  Objective: Vital signs in last 24 hours: Temp:  [97.7 F (36.5 C)-98.3 F (36.8 C)] 98.3 F (36.8 C) (12/09 0302) Pulse Rate:  [75-93] 83 (12/09 0302) Resp:  [17-18] 17 (12/09 0302) BP: (119-162)/(52-85) 155/79 (12/09 0302) SpO2:  [92 %-100 %] 100 % (12/09 0302) Weight:  [72.6 kg] 72.6 kg (12/09 0302) Last BM Date: (Pt is unsure of last BM)  Intake/Output from previous day: 12/08 0701 - 12/09 0700 In: 1505.4 [P.O.:30; IV Piggyback:1475.4] Out: 100 [Urine:100] Intake/Output this shift: No intake/output data recorded.  PE: Gen:  Alert, NAD HEENT: EOM's intact, pupils equal and round Card:  RRR Pulm:  effort normal, R sided chest wall tenderness, mild rhonchi bilaterally, no wheezing, subq emphysema on the R, pulling 500 on IS Abd: Soft, NT/ND, +BS, no HSM Ext:  Calves soft and nontender Skin: no rashes noted, warm and dry  Lab Results:  Recent Labs    05/14/18 0025 05/14/18 0626  WBC 13.5* 10.9*  HGB 14.9 14.0  HCT 42.6 40.1  PLT 203 239   BMET Recent Labs    05/14/18 0025 05/14/18 0626  NA 123* 125*  K 3.7 3.7  CL 88* 90*  CO2 19* 23  GLUCOSE 110* 99  BUN <5* <5*  CREATININE 0.52* 0.66  CALCIUM 8.1* 7.9*   PT/INR No results for input(s): LABPROT, INR in the last 72 hours. CMP     Component Value Date/Time   NA 125 (L) 05/14/2018 0626   K 3.7 05/14/2018 0626   CL 90 (L) 05/14/2018 0626   CO2 23 05/14/2018 0626   GLUCOSE 99 05/14/2018 0626   BUN <5 (L) 05/14/2018 0626   CREATININE 0.66 05/14/2018 0626   CALCIUM 7.9 (L) 05/14/2018  0626   PROT 6.1 (L) 05/14/2018 0639   ALBUMIN 3.0 (L) 05/14/2018 0639   AST 34 05/14/2018 0639   ALT 32 05/14/2018 0639   ALKPHOS 57 05/14/2018 0639   BILITOT 1.4 (H) 05/14/2018 0639   GFRNONAA >60 05/14/2018 0626   GFRAA >60 05/14/2018 0626   Lipase     Component Value Date/Time   LIPASE 18 06/14/2014 1912       Studies/Results: Ct Chest Wo Contrast  Result Date: 05/14/2018 CLINICAL DATA:  67 year old male with fall and trauma to the right chest wall. EXAM: CT CHEST WITHOUT CONTRAST TECHNIQUE: Multidetector CT imaging of the chest was performed following the standard protocol without IV contrast. COMPARISON:  Chest radiograph dated 07/02/2014 FINDINGS: Evaluation of this exam is limited in the absence of intravenous contrast. Cardiovascular: Top-normal cardiac size. No pericardial effusion. Multi vessel coronary vascular calcification and postsurgical changes of CABG. A 2.3 x 2.1 cm rounded structure in the anterior mediastinum anterior to the pulmonary trunk (series 3, image 70) is not characterized on this noncontrast CT but most likely represents a saccular aneurysm. This can be further evaluated with contrast enhanced CT. There is mild atherosclerotic calcification of the thoracic aorta. The central pulmonary arteries are grossly unremarkable. A left subclavian stent is not well evaluated on this noncontrast CT. Mediastinum/Nodes: No hilar or mediastinal adenopathy. Small  hiatal hernia. No mediastinal fluid collection. Lungs/Pleura: There is a background of emphysema. Minimal bibasilar atelectatic changes. There is a trace right pleural effusion. Minimal pneumothorax in the right apex measures 3 mm in thickness. No focal consolidation. Small amount of mucus material noted in the central airway. The airways are otherwise patent. Upper Abdomen: Cholecystectomy. The visualized upper abdomen is otherwise unremarkable. Musculoskeletal: Multiple mildly displaced right rib fractures involving the  right 5th-11th ribs. There is a small amount of chest wall emphysema. Right scapular fixation screws. IMPRESSION: 1. Multiple mildly displaced right rib fractures involving the right 5th-11th ribs. 2. Minimal right apical pneumothorax. 3. Rounded structure in the anterior mediastinum, likely a saccular aneurysm. This can be further evaluated with contrast enhanced CT on a nonemergent basis. These results were called by telephone at the time of interpretation on 05/14/2018 at 12:18 am to Dr. Addison Lank , who verbally acknowledged these results. Electronically Signed   By: Anner Crete M.D.   On: 05/14/2018 00:20   Dg Chest Port 1 View  Result Date: 05/14/2018 CLINICAL DATA:  67 year old male with right rib fractures. EXAM: PORTABLE CHEST 1 VIEW COMPARISON:  Chest CT dated 05/13/2018 FINDINGS: No visible pneumothorax. Right rib fractures with associated soft tissue emphysema of the right lateral chest wall. Bibasilar atelectasis. Cardiomegaly, median sternotomy wires, and CABG vascular clips. Left subclavian artery stent. IMPRESSION: 1. No visible pneumothorax. 2. Right rib fractures with associated soft tissue emphysema of the right lateral chest wall. Electronically Signed   By: Anner Crete M.D.   On: 05/14/2018 05:56    Anti-infectives: Anti-infectives (From admission, onward)   None       Assessment/Plan H/o CAD, prior CABG H/o CVA, prior carotid stent COPD Tobacco abuse Alcohol abuse - CIWA Hyponatremia - per primary Incidental CT finding ?saccular aneurysm - per primary  R rib fxs 5-11 with apical PNX, subcutaneous emphysema  - f/u CXR stable without pneumothorax - Pulmonary toilet, incentive spirometer - multimodal pain control: scheduled Tylenol, Gabapentin, Ibuprofen with PRN fentanyl, oxycodone, and robaxin - PT/OT  ID - none FEN - CLD ADAT VTE - SCDs Foley - none   LOS: 0 days    Wellington [place] , [facility] 05/14/2018, 8:07 AM Pager:  [phone] Mon 7:00 am -11:30 AM Tues-Fri 7:00 am-4:30 pm Sat-Sun 7:00 am-11:30 am

## 2018-05-14 NOTE — ED Notes (Signed)
Roommate, nelson brooks, would like an update on pt at [phone]

## 2018-05-14 NOTE — Progress Notes (Signed)
[facility]                                                                              Patient Demographics  [name] Pace, is a 67 y.o. male, DOB - Oct 03, 1950, [id number]  Admit date - 05/13/2018   Admitting Physician [name], MD  Outpatient Primary MD for the patient is System, Provider Not In  Outpatient specialists:   LOS - 0  days   Medical records reviewed and are as summarized below:    Chief Complaint  Patient presents with  . Fall       Brief summary   Patient is a 67 year old male with history of alcohol abuse, CAD, history of CVA presented to ED with severe right-sided chest wall pain after a fall on the night of 12/7.  Patient reported that he slipped at home striking his right chest against a nightstand.  Denied hitting his head or losing consciousness but reported severe pleuritic pain in the right chest since then.  Reports drinking 6 pack of beer most nights. In ED afebrile, O2 sats low 80s on room air, sodium 123, bicarb 19, glucose 110, leukocytosis with WBCs 13.5, macrocytosis. CT chest showed multiple mildly displaced right rib fractures involving the right 5th-11th ribs, minimal right apical pneumothorax and rounded structure in the anterior mediastinum likely reflecting a saccular aneurysm. Patient was admitted for further work-up.   Assessment & Plan    Principal Problem:   Multiple right rib fractures, 5-11  -Status post mechanical fall, presented with severe right chest pain -CT showed multiple displaced rib fractures 5-11 on the right, minimal right apical pneumothorax -Trauma surgery consulted, appreciate recommendations -Follow CK -Continue pain control  Active Problems:   Pneumothorax, closed, traumatic, small  -CT chest showed multiple rib fractures with minimal right apical pneumothorax -Repeat chest x-ray this morning shows resolved pneumothorax    Hyponatremia -Possibly beer potomania, however still appears  to be hypovolemic, improving, 123 at the time of admission -Received 1.5 L normal saline, serum osmolarity measured 260, unknown ethanol level, urine osmolality 422 with a urine sodium less than 10 -Currently on gentle hydration, recheck bmet in 6 hours, likely also has underlying SIADH due to pulmonary process    Alcohol abuse -Continue CIWA scale with Ativan, has received Ativan at 6:30 this morning, currently somnolent -Continue thiamine, folate    CAD (coronary artery disease) -No acute issues    History of stroke -Continue statin  Incidental saccular aneurysm CT incidentally showed rounded structure in the anterior mediastinum likely reflecting a saccular aneurysm. Nonemergent contrast CT recommended, outpatient follow-up with CT surgery  Code Status: Full code DVT Prophylaxis:  SCD's Family Communication: No family member at the bedside   Disposition Plan: When medically ready  Time Spent in minutes   35 minutes  Procedures:  CT chest without contrast  Consultants:   Trauma surgery  Antimicrobials:      Medications  Scheduled Meds: . acetaminophen  1,000 mg Oral Q6H  . atorvastatin  40 mg Oral q1800  . docusate sodium  100 mg Oral BID  . folic acid  1 mg Oral Daily  .  gabapentin  300 mg Oral TID  . ibuprofen  800 mg Oral TID WC  . [START ON 05/15/2018] Influenza vac split quadrivalent PF  0.5 mL Intramuscular Tomorrow-1000  . LORazepam  0-4 mg Intravenous Q6H   Followed by  . [START ON 05/16/2018] LORazepam  0-4 mg Intravenous Q12H  . multivitamin with minerals  1 tablet Oral Daily  . [START ON 05/15/2018] pneumococcal 23 valent vaccine  0.5 mL Intramuscular Tomorrow-1000  . sodium chloride flush  3 mL Intravenous Q12H  . sodium chloride flush  3 mL Intravenous Q12H  . thiamine  100 mg Oral Daily   Or  . thiamine  100 mg Intravenous Daily   Continuous Infusions: . sodium chloride    . sodium chloride     PRN Meds:.sodium chloride, fentaNYL  (SUBLIMAZE) injection, LORazepam **OR** LORazepam, methocarbamol, ondansetron **OR** ondansetron (ZOFRAN) IV, oxyCODONE, polyethylene glycol, sodium chloride flush   Antibiotics   Anti-infectives (From admission, onward)   None        Subjective:   [name] Pace was seen and examined today.  Currently somnolent, had received Ativan earlier this morning, difficult to obtain review of system from the patient.  Objective:   Vitals:   05/14/18 0145 05/14/18 0157 05/14/18 0200 05/14/18 0302  BP: (!) 162/81 (!) 162/85 119/79 (!) 155/79  Pulse: 93 93 89 83  Resp:  18  17  Temp:    98.3 F (36.8 C)  TempSrc:    Oral  SpO2: 98% 98% 96% 100%  Weight:    72.6 kg  Height:    6\' 4"  (1.93 m)    Intake/Output Summary (Last 24 hours) at 05/14/2018 1007 Last data filed at 05/14/2018 0500 Gross per 24 hour  Intake 1505.4 ml  Output 100 ml  Net 1405.4 ml     Wt Readings from Last 3 Encounters:  05/14/18 72.6 kg  07/04/14 69.6 kg  06/14/14 67.6 kg     Exam  General: Somnolent, arousable but does not respond to verbal commands  Eyes:   HEENT:  Atraumatic, normocephalic  Cardiovascular: S1 S2 auscultated,. Regular rate and rhythm.  Respiratory: CTA anteriorly  Gastrointestinal: Soft, nontender, nondistended, + bowel sounds  Ext: no pedal edema bilaterally  Neuro: Unable to assess  Musculoskeletal: No digital cyanosis, clubbing  Skin: No rashes  Psych: Somnolent   Data Reviewed:  I have personally reviewed following labs and imaging studies  Micro Results No results found for this or any previous visit (from the past 240 hour(s)).  Radiology Reports Ct Chest Wo Contrast  Result Date: 05/14/2018 CLINICAL DATA:  67 year old male with fall and trauma to the right chest wall. EXAM: CT CHEST WITHOUT CONTRAST TECHNIQUE: Multidetector CT imaging of the chest was performed following the standard protocol without IV contrast. COMPARISON:  Chest radiograph dated 07/02/2014  FINDINGS: Evaluation of this exam is limited in the absence of intravenous contrast. Cardiovascular: Top-normal cardiac size. No pericardial effusion. Multi vessel coronary vascular calcification and postsurgical changes of CABG. A 2.3 x 2.1 cm rounded structure in the anterior mediastinum anterior to the pulmonary trunk (series 3, image 70) is not characterized on this noncontrast CT but most likely represents a saccular aneurysm. This can be further evaluated with contrast enhanced CT. There is mild atherosclerotic calcification of the thoracic aorta. The central pulmonary arteries are grossly unremarkable. A left subclavian stent is not well evaluated on this noncontrast CT. Mediastinum/Nodes: No hilar or mediastinal adenopathy. Small hiatal hernia. No mediastinal fluid collection. Lungs/Pleura: There is  a background of emphysema. Minimal bibasilar atelectatic changes. There is a trace right pleural effusion. Minimal pneumothorax in the right apex measures 3 mm in thickness. No focal consolidation. Small amount of mucus material noted in the central airway. The airways are otherwise patent. Upper Abdomen: Cholecystectomy. The visualized upper abdomen is otherwise unremarkable. Musculoskeletal: Multiple mildly displaced right rib fractures involving the right 5th-11th ribs. There is a small amount of chest wall emphysema. Right scapular fixation screws. IMPRESSION: 1. Multiple mildly displaced right rib fractures involving the right 5th-11th ribs. 2. Minimal right apical pneumothorax. 3. Rounded structure in the anterior mediastinum, likely a saccular aneurysm. This can be further evaluated with contrast enhanced CT on a nonemergent basis. These results were called by telephone at the time of interpretation on 05/14/2018 at 12:18 am to Dr. Addison Lank , who verbally acknowledged these results. Electronically Signed   By: Anner Crete M.D.   On: 05/14/2018 00:20   Dg Chest Port 1 View  Result Date:  05/14/2018 CLINICAL DATA:  67 year old male with right rib fractures. EXAM: PORTABLE CHEST 1 VIEW COMPARISON:  Chest CT dated 05/13/2018 FINDINGS: No visible pneumothorax. Right rib fractures with associated soft tissue emphysema of the right lateral chest wall. Bibasilar atelectasis. Cardiomegaly, median sternotomy wires, and CABG vascular clips. Left subclavian artery stent. IMPRESSION: 1. No visible pneumothorax. 2. Right rib fractures with associated soft tissue emphysema of the right lateral chest wall. Electronically Signed   By: Anner Crete M.D.   On: 05/14/2018 05:56    Lab Data:  CBC: Recent Labs  Lab 05/14/18 0025 05/14/18 0626  WBC 13.5* 10.9*  NEUTROABS 11.4* 8.9*  HGB 14.9 14.0  HCT 42.6 40.1  MCV 102.2* 99.3  PLT 203 956   Basic Metabolic Panel: Recent Labs  Lab 05/14/18 0025 05/14/18 0626  NA 123* 125*  K 3.7 3.7  CL 88* 90*  CO2 19* 23  GLUCOSE 110* 99  BUN <5* <5*  CREATININE 0.52* 0.66  CALCIUM 8.1* 7.9*   GFR: Estimated Creatinine Clearance: 94.5 mL/min (by C-G formula based on SCr of 0.66 mg/dL). Liver Function Tests: Recent Labs  Lab 05/14/18 0639  AST 34  ALT 32  ALKPHOS 57  BILITOT 1.4*  PROT 6.1*  ALBUMIN 3.0*   No results for input(s): LIPASE, AMYLASE in the last 168 hours. No results for input(s): AMMONIA in the last 168 hours. Coagulation Profile: No results for input(s): INR, PROTIME in the last 168 hours. Cardiac Enzymes: No results for input(s): CKTOTAL, CKMB, CKMBINDEX, TROPONINI in the last 168 hours. BNP (last 3 results) No results for input(s): PROBNP in the last 8760 hours. HbA1C: No results for input(s): HGBA1C in the last 72 hours. CBG: No results for input(s): GLUCAP in the last 168 hours. Lipid Profile: No results for input(s): CHOL, HDL, LDLCALC, TRIG, CHOLHDL, LDLDIRECT in the last 72 hours. Thyroid Function Tests: No results for input(s): TSH, T4TOTAL, FREET4, T3FREE, THYROIDAB in the last 72 hours. Anemia  Panel: No results for input(s): VITAMINB12, FOLATE, FERRITIN, TIBC, IRON, RETICCTPCT in the last 72 hours. Urine analysis:    Component Value Date/Time   COLORURINE YELLOW 06/14/2014 2135   APPEARANCEUR CLEAR 06/14/2014 2135   LABSPEC 1.019 06/14/2014 2135   PHURINE 6.0 06/14/2014 2135   GLUCOSEU NEGATIVE 06/14/2014 2135   HGBUR NEGATIVE 06/14/2014 2135   [place] NEGATIVE 06/14/2014 2135   KETONESUR 40 (A) 06/14/2014 2135   PROTEINUR NEGATIVE 06/14/2014 2135   UROBILINOGEN 0.2 06/14/2014 2135   NITRITE NEGATIVE 06/14/2014  2135   LEUKOCYTESUR NEGATIVE 06/14/2014 2135     Ripudeep Rai M.D. [facility] 05/14/2018, 10:07 AM  Pager: [phone] Between 7am to 7pm - call Pager - [phone]  After 7pm go to [email or web address] - password [email or web address]  Call night coverage person covering after 7pm

## 2018-05-14 NOTE — Progress Notes (Signed)
   05/14/18 1000  Clinical Encounter Type  Visited With Health care provider  Visit Type Initial   Pt currently prefers to be entirely disrobed as reported by RN.  Chaplain will delay routine introductory visit until pt is wear some garments.  However, if pt has need to see chaplain, please page.  Myra Gianotti resident, [phone]

## 2018-05-14 NOTE — Consult Note (Addendum)
Surgical Consultation Requesting provider: Dr. Leonette Monarch  CC: rib fractures  HPI: 67 year old man with multiple comorbidities as listed below who presented to the emergency room around 11:30 PM this evening by EMS for evaluation of refractory right-sided chest wall pain. He had a mechanical fall from ground level around noon the day prior, greater than 24 hours prior to presentation and states that he hit the corner of his entertainment center with the right chest wall on the way down. He immediately experienced right-sided chest pain but thought it would resolve on its own however the pain continually worsened and is now so severe he can barely move. He has tried Tylenol with minimal relief. Patient exacerbated by movement, palpation of the right chest wall, deep breaths. Alleviated only by immobility. Denies any trauma to the head, denies neck pain, denies back pain, hip pain, or any extremity pain or abdominal pain.  Patient Active Problem List   Diagnosis Date Noted  . Laceration of muscle(s) and tendon(s) of anterior muscle group at lower leg level, right leg, initial encounter 02/10/2016  . Traumatic rupture of right anterior tibial tendon 02/10/2016  . Hip fracture, left ([place]) 07/02/2014  . Closed left hip fracture ([place]) 07/02/2014  . Hip fracture ([place]) 07/02/2014  . Intertrochanteric fracture of left femur ([place]) 07/02/2014  . History of stroke   . Preop examination   . Subclavian artery stenosis, left ([place]) 06/15/2014  . Chest pain 06/14/2014  . CAP (community acquired pneumonia) 06/14/2014  . Hyponatremia 06/14/2014  . Sepsis ([place]) 06/14/2014  . Alcohol abuse 06/14/2014  . CAD (coronary artery disease) 06/14/2014  . Tobacco abuse 06/14/2014     Allergies  Allergen Reactions  . Penicillins Anaphylaxis    throat    Past Medical History:  Diagnosis Date  . Alcohol abuse   . Anxiety   . Breast cancer ([place]) Left breast   Per patient diagnosed in early 91's.   . Coronary  artery disease   . High cholesterol   . Prostate cancer ([place])    per patient diagnosed in early 91's  . Stroke [facility])     Past Surgical History:  Procedure Laterality Date  . CAROTID STENT    . CORONARY ARTERY BYPASS GRAFT    . FEMUR IM NAIL Left 07/02/2014   Procedure: INTRAMEDULLARY (IM) NAIL FEMORAL;  Surgeon: Elie Goody, MD;  Location: [facility];  Service: Orthopedics;  Laterality: Left;  . I&D EXTREMITY Right 02/10/2016   Procedure: IRRIGATION AND DEBRIDEMENT EXTREMITY;  Surgeon: Dorna Leitz, MD;  Location: [facility];  Service: Orthopedics;  Laterality: Right;  . TENDON REPAIR Right 02/10/2016   Procedure: Anterior TENDON REPAIR and wound exploration.;  Surgeon: Dorna Leitz, MD;  Location: [facility];  Service: Orthopedics;  Laterality: Right;    Family History  Problem Relation Age of Onset  . Dementia Mother     Social History   Socioeconomic History  . Marital status: Married    Spouse name: Not on file  . Number of children: Not on file  . Years of education: Not on file  . Highest education level: Not on file  Occupational History  . Not on file  Social Needs  . Financial resource strain: Not on file  . Food insecurity:    Worry: Not on file    Inability: Not on file  . Transportation needs:    Medical: Not on file    Non-medical: Not on file  Tobacco Use  . Smoking status: Current Every Day  Smoker    Packs/day: 1.00    Years: 42.00    Pack years: 42.00    Types: Cigarettes  . Smokeless tobacco: Never Used  Substance and Sexual Activity  . Alcohol use: Yes    Comment: 4-5 beers a day  . Drug use: No  . Sexual activity: Not on file  Lifestyle  . Physical activity:    Days per week: Not on file    Minutes per session: Not on file  . Stress: Not on file  Relationships  . Social connections:    Talks on phone: Not on file    Gets together: Not on file    Attends religious service: Not on file    Active member of club or organization: Not on file     Attends meetings of clubs or organizations: Not on file    Relationship status: Not on file  Other Topics Concern  . Not on file  Social History Narrative  . Not on file    No current facility-administered medications on file prior to encounter.    Current Outpatient Medications on File Prior to Encounter  Medication Sig Dispense Refill  . ALPRAZolam (XANAX) 1 MG tablet Take 1 mg by mouth 3 (three) times daily.     Marland Kitchen aspirin EC 325 MG EC tablet Take 1 tablet (325 mg total) by mouth 2 (two) times daily with a meal. 30 tablet 0  . atorvastatin (LIPITOR) 40 MG tablet Take 40 mg by mouth daily.    Marland Kitchen doxycycline (VIBRA-TABS) 100 MG tablet Take 1 tablet (100 mg total) by mouth 2 (two) times daily. 20 tablet 0  . oxyCODONE-acetaminophen (PERCOCET) 10-325 MG tablet Take 1-2 tablets by mouth every 6 (six) hours as needed for pain. 40 tablet 0    Review of Systems: a complete, 10pt review of systems was completed with pertinent positives and negatives as documented in the HPI  Physical Exam: Vitals:   05/14/18 0100 05/14/18 0115  BP: (!) 123/52   Pulse: 79 75  Resp:    Temp:    SpO2: 92% 92%   Gen: A&Ox3, no distress, chronically ill-appearing Head: normocephalic, atraumatic Eyes: extraocular motions intact, anicteric.  Neck: supple without mass or thyromegaly, no midline tenderness Chest: unlabored respirations, symmetrical air entry, clear bilaterally/ tenderness of the right chest wall and crepitus from fractures. Cardiovascular: RRR with palpable distal pulses, no pedal edema Abdomen: soft, nondistended, nontender. No mass or organomegaly.  Extremities: warm, without edema, no deformities  Neuro: grossly intact, GCS 15 Psych: appropriate mood and affect, normal insight  Skin: warm and dry   CBC Latest Ref Rng & Units 05/14/2018 02/10/2016 07/03/2014  WBC 4.0 - 10.5 K/uL 13.5(H) - 7.0  Hemoglobin 13.0 - 17.0 g/dL 14.9 16.3 12.9(L)  Hematocrit 39.0 - 52.0 % 42.6 48.0 38.8(L)   Platelets 150 - 400 K/uL 203 - 318    CMP Latest Ref Rng & Units 05/14/2018 02/10/2016 07/03/2014  Glucose 70 - 99 mg/dL 110(H) 89 137(H)  BUN 8 - 23 mg/dL <5(L) 4(L) 7  Creatinine 0.61 - 1.24 mg/dL 0.52(L) 0.80 0.68  Sodium 135 - 145 mmol/L 123(L) 139 131(L)  Potassium 3.5 - 5.1 mmol/L 3.7 4.2 4.2  Chloride 98 - 111 mmol/L 88(L) 103 101  CO2 22 - 32 mmol/L 19(L) - 24  Calcium 8.9 - 10.3 mg/dL 8.1(L) - 7.6(L)  Total Protein 6.0 - 8.3 g/dL - - 5.5(L)  Total Bilirubin 0.3 - 1.2 mg/dL - - 0.6  Alkaline Phos  39 - 117 U/L - - 122(H)  AST 0 - 37 U/L - - 26  ALT 0 - 53 U/L - - 15    Lab Results  Component Value Date   INR 0.96 07/02/2014   INR 1.07 06/15/2014    Imaging: Ct Chest Wo Contrast  Result Date: 05/14/2018 CLINICAL DATA:  67 year old male with fall and trauma to the right chest wall. EXAM: CT CHEST WITHOUT CONTRAST TECHNIQUE: Multidetector CT imaging of the chest was performed following the standard protocol without IV contrast. COMPARISON:  Chest radiograph dated 07/02/2014 FINDINGS: Evaluation of this exam is limited in the absence of intravenous contrast. Cardiovascular: Top-normal cardiac size. No pericardial effusion. Multi vessel coronary vascular calcification and postsurgical changes of CABG. A 2.3 x 2.1 cm rounded structure in the anterior mediastinum anterior to the pulmonary trunk (series 3, image 70) is not characterized on this noncontrast CT but most likely represents a saccular aneurysm. This can be further evaluated with contrast enhanced CT. There is mild atherosclerotic calcification of the thoracic aorta. The central pulmonary arteries are grossly unremarkable. A left subclavian stent is not well evaluated on this noncontrast CT. Mediastinum/Nodes: No hilar or mediastinal adenopathy. Small hiatal hernia. No mediastinal fluid collection. Lungs/Pleura: There is a background of emphysema. Minimal bibasilar atelectatic changes. There is a trace right pleural effusion.  Minimal pneumothorax in the right apex measures 3 mm in thickness. No focal consolidation. Small amount of mucus material noted in the central airway. The airways are otherwise patent. Upper Abdomen: Cholecystectomy. The visualized upper abdomen is otherwise unremarkable. Musculoskeletal: Multiple mildly displaced right rib fractures involving the right 5th-11th ribs. There is a small amount of chest wall emphysema. Right scapular fixation screws. IMPRESSION: 1. Multiple mildly displaced right rib fractures involving the right 5th-11th ribs. 2. Minimal right apical pneumothorax. 3. Rounded structure in the anterior mediastinum, likely a saccular aneurysm. This can be further evaluated with contrast enhanced CT on a nonemergent basis. These results were called by telephone at the time of interpretation on 05/14/2018 at 12:18 am to Dr. Addison Lank , who verbally acknowledged these results. Electronically Signed   By: Anner Crete M.D.   On: 05/14/2018 00:73    A/P: 67 year old gentleman who sustained a mechanical fall greater than 24 hours prior to presentation  Right rib 5-11 fx, subcutaneous emphysema and minimal 68mm apical right pneumothorax versus bleb, trace right pleural effusion  Multimodal pain control; he may be one who needs epidural. Scheduled tylenol, ibuprofen, gabapentin, PRN robaxin, oxycodone, and fenanyl  Pulmonary toilet  Repeat CXR in AM   COPD, active tobacco abuse Hyponatremia per primary Alcohol abuse- CIWA or withdrawal management per primary History of coronary artery disease, prior CABG; aortic atherosclerosis History of stroke, prior carotid stent and apparently has a left subclavian stent is well Possible saccular aneurysm noted on CT in anterior mediastinum anterior to the pulmonary trunk- outpt follow up with CT w IV contrast may need outpt CVTS consultation    Romana Juniper, MD [facility], [place] Pager [phone]

## 2018-05-14 NOTE — Progress Notes (Signed)
PT Cancellation Note  Patient Details Name: [name] Pace MRN: [id number] DOB: 1950/10/24   Cancelled Treatment:    Reason Eval/Treat Not Completed: Other (comment).  Pt was too lethargic to be seen, and in talking to nursing afterward found he had been given Ativan.  Will try again at another time.   Ramond Dial 05/14/2018, 1:49 PM   Mee Hives, PT MS Acute Rehab Dept. Number: [place] [facility] [place]

## 2018-05-14 NOTE — ED Notes (Signed)
Nurse starting IV and will get labs. 

## 2018-05-14 NOTE — H&P (Signed)
History and Physical    [name] Pace DOB: [birth year] DOA: 05/13/2018  PCP: System, Provider Not In   Patient coming from: Home   Chief Complaint: Fall with severe right chest pain   HPI: [name] Pace is a 67 y.o. male with medical history significant for alcohol abuse, CAD, and history of CVA, now presenting to the emergency department with severe right chest wall pain after a fall the night of 05/12/2018.  The patient reports that he been in his usual state of health, was having an uneventful day when he slipped at home, striking his right chest against a nightstand.  States that he had just gotten a new pair of loafers with a smooth leather sole, and believes that this is what caused him to slip on his carpet.  He denies hitting his head or losing consciousness, but reports severe pain in the right chest since then.  He has mainly been staying still, lying on his right side since the injury due to severe pain.  Pain is much worse with breathing.  He reports also hitting his shoulder during the fall, but does not have much pain there and is moving the right arm freely.  He reports drinking a sixpack of beer most nights, denies history of withdrawal symptoms or seizure.  Denies any recent fevers or chills.  ED Course: Upon arrival to the ED, patient is found to be afebrile, saturating low 80s on room air, and with vitals otherwise normal.  Chemistry panel features a sodium of 123 with bicarbonate of 19 and glucose 110.  CBC features a leukocytosis to 13,500 and a macrocytosis without anemia.  CT chest reveals multiple mildly displaced right rib fractures involving right fifth through 11th ribs with minimal right apical pneumothorax and a rounded structure in the anterior mediastinum likely reflecting a saccular aneurysm.  Patient was given 1.5 L normal saline in the emergency department and 2 doses of IV Dilaudid.  He was placed on supplemental oxygen.  Trauma surgery was consulted by the  ED physician and recommends a medical admission for management of the hyponatremia.  Review of Systems:  All other systems reviewed and apart from HPI, are negative.  Past Medical History:  Diagnosis Date  . Alcohol abuse   . Anxiety   . Breast cancer ([place]) Left breast   Per patient diagnosed in early 14's.   . Coronary artery disease   . High cholesterol   . Prostate cancer ([place])    per patient diagnosed in early 62's  . Stroke [facility])     Past Surgical History:  Procedure Laterality Date  . CAROTID STENT    . CORONARY ARTERY BYPASS GRAFT    . FEMUR IM NAIL Left 07/02/2014   Procedure: INTRAMEDULLARY (IM) NAIL FEMORAL;  Surgeon: Elie Goody, MD;  Location: [facility];  Service: Orthopedics;  Laterality: Left;  . I&D EXTREMITY Right 02/10/2016   Procedure: IRRIGATION AND DEBRIDEMENT EXTREMITY;  Surgeon: Dorna Leitz, MD;  Location: [facility];  Service: Orthopedics;  Laterality: Right;  . TENDON REPAIR Right 02/10/2016   Procedure: Anterior TENDON REPAIR and wound exploration.;  Surgeon: Dorna Leitz, MD;  Location: [facility];  Service: Orthopedics;  Laterality: Right;     reports that he has been smoking cigarettes. He has a 42.00 pack-year smoking history. He has never used smokeless tobacco. He reports that he drinks alcohol. He reports that he does not use drugs.  Allergies  Allergen Reactions  . Penicillins Anaphylaxis  throat    Family History  Problem Relation Age of Onset  . Dementia Mother      Prior to Admission medications   Medication Sig Start Date End Date Taking? Authorizing Provider  ALPRAZolam Duanne Moron) 1 MG tablet Take 1 mg by mouth 3 (three) times daily.  11/19/13   [provider]  aspirin EC 325 MG EC tablet Take 1 tablet (325 mg total) by mouth 2 (two) times daily with a meal. 07/04/14   Debbe Odea, MD  atorvastatin (LIPITOR) 40 MG tablet Take 40 mg by mouth daily.    [provider]  doxycycline (VIBRA-TABS) 100 MG tablet Take 1 tablet  (100 mg total) by mouth 2 (two) times daily. 02/10/16   Bartosz Fleet, PA-C  oxyCODONE-acetaminophen (PERCOCET) 10-325 MG tablet Take 1-2 tablets by mouth every 6 (six) hours as needed for pain. 02/10/16   Zakhari Fleet, PA-C    Physical Exam: Vitals:   05/14/18 0100 05/14/18 0115 05/14/18 0145 05/14/18 0157  BP: (!) 123/52  (!) 162/81 (!) 162/85  Pulse: 79 75 93 93  Resp:    18  Temp:      TempSrc:      SpO2: 92% 92% 98% 98%  Weight:      Height:        Constitutional: NAD, calm, appears uncomfortable Eyes: PERTLA, lids and conjunctivae normal ENMT: Mucous membranes are moist. Posterior pharynx clear of any exudate or lesions.   Neck: normal, supple, no masses, no thyromegaly Respiratory: Guarding right chest, shallow inspirations. No wheeze, rhonchi, or crackles.  Cardiovascular: S1 & S2 heard, regular rate and rhythm. No extremity edema.   Abdomen: No distension, no tenderness, soft. Bowel sounds active.  Musculoskeletal: no clubbing / cyanosis. Lying on right side, protecting right chest.   Skin: no significant rashes, lesions, ulcers. Poor turgor. Neurologic: No facial asymmetry. Sensation intact. Moving all extremities.  Psychiatric: Alert and oriented to person, place, and situation. Pleasant, cooperative.    Labs on Admission: I have personally reviewed following labs and imaging studies  CBC: Recent Labs  Lab 05/14/18 0025  WBC 13.5*  NEUTROABS 11.4*  HGB 14.9  HCT 42.6  MCV 102.2*  PLT 071   Basic Metabolic Panel: Recent Labs  Lab 05/14/18 0025  NA 123*  K 3.7  CL 88*  CO2 19*  GLUCOSE 110*  BUN <5*  CREATININE 0.52*  CALCIUM 8.1*   GFR: Estimated Creatinine Clearance: 94.5 mL/min (A) (by C-G formula based on SCr of 0.52 mg/dL (L)). Liver Function Tests: No results for input(s): AST, ALT, ALKPHOS, BILITOT, PROT, ALBUMIN in the last 168 hours. No results for input(s): LIPASE, AMYLASE in the last 168 hours. No results for input(s): AMMONIA in the  last 168 hours. Coagulation Profile: No results for input(s): INR, PROTIME in the last 168 hours. Cardiac Enzymes: No results for input(s): CKTOTAL, CKMB, CKMBINDEX, TROPONINI in the last 168 hours. BNP (last 3 results) No results for input(s): PROBNP in the last 8760 hours. HbA1C: No results for input(s): HGBA1C in the last 72 hours. CBG: No results for input(s): GLUCAP in the last 168 hours. Lipid Profile: No results for input(s): CHOL, HDL, LDLCALC, TRIG, CHOLHDL, LDLDIRECT in the last 72 hours. Thyroid Function Tests: No results for input(s): TSH, T4TOTAL, FREET4, T3FREE, THYROIDAB in the last 72 hours. Anemia Panel: No results for input(s): VITAMINB12, FOLATE, FERRITIN, TIBC, IRON, RETICCTPCT in the last 72 hours. Urine analysis:    Component Value Date/Time   COLORURINE YELLOW 06/14/2014 2135  APPEARANCEUR CLEAR 06/14/2014 2135   LABSPEC 1.019 06/14/2014 2135   PHURINE 6.0 06/14/2014 2135   GLUCOSEU NEGATIVE 06/14/2014 2135   HGBUR NEGATIVE 06/14/2014 2135   [place] NEGATIVE 06/14/2014 2135   KETONESUR 40 (A) 06/14/2014 2135   PROTEINUR NEGATIVE 06/14/2014 2135   UROBILINOGEN 0.2 06/14/2014 2135   NITRITE NEGATIVE 06/14/2014 2135   LEUKOCYTESUR NEGATIVE 06/14/2014 2135   Sepsis Labs: @LABRCNTIP (procalcitonin:4,lacticidven:4) )No results found for this or any previous visit (from the past 240 hour(s)).   Radiological Exams on Admission: Ct Chest Wo Contrast  Result Date: 05/14/2018 CLINICAL DATA:  67 year old male with fall and trauma to the right chest wall. EXAM: CT CHEST WITHOUT CONTRAST TECHNIQUE: Multidetector CT imaging of the chest was performed following the standard protocol without IV contrast. COMPARISON:  Chest radiograph dated 07/02/2014 FINDINGS: Evaluation of this exam is limited in the absence of intravenous contrast. Cardiovascular: Top-normal cardiac size. No pericardial effusion. Multi vessel coronary vascular calcification and postsurgical changes  of CABG. A 2.3 x 2.1 cm rounded structure in the anterior mediastinum anterior to the pulmonary trunk (series 3, image 70) is not characterized on this noncontrast CT but most likely represents a saccular aneurysm. This can be further evaluated with contrast enhanced CT. There is mild atherosclerotic calcification of the thoracic aorta. The central pulmonary arteries are grossly unremarkable. A left subclavian stent is not well evaluated on this noncontrast CT. Mediastinum/Nodes: No hilar or mediastinal adenopathy. Small hiatal hernia. No mediastinal fluid collection. Lungs/Pleura: There is a background of emphysema. Minimal bibasilar atelectatic changes. There is a trace right pleural effusion. Minimal pneumothorax in the right apex measures 3 mm in thickness. No focal consolidation. Small amount of mucus material noted in the central airway. The airways are otherwise patent. Upper Abdomen: Cholecystectomy. The visualized upper abdomen is otherwise unremarkable. Musculoskeletal: Multiple mildly displaced right rib fractures involving the right 5th-11th ribs. There is a small amount of chest wall emphysema. Right scapular fixation screws. IMPRESSION: 1. Multiple mildly displaced right rib fractures involving the right 5th-11th ribs. 2. Minimal right apical pneumothorax. 3. Rounded structure in the anterior mediastinum, likely a saccular aneurysm. This can be further evaluated with contrast enhanced CT on a nonemergent basis. These results were called by telephone at the time of interpretation on 05/14/2018 at 12:18 am to Dr. Addison Lank , who verbally acknowledged these results. Electronically Signed   By: Anner Crete M.D.   On: 05/14/2018 00:20    EKG: Not performed.   Assessment/Plan   1. Multiple right rib fractures; minimal right pneumothorax - Presents with severe right chest pain after falling and hitting a nightstand  - CT reveals mildly displaced fractures involving ribs 5-11 on the right with  minimal right apical pneumothorax  - Trauma surgery consulting and much appreciated, will follow-up on recommendations   2. Hyponatremia  - Serum sodium is 123 on admission with no recent labs available  - He appears hypovolemic, drinks excessive amounts of beer  - Given 1.5 liters NS in ED  - SLIV for now, repeat BMP now that he has been fluid-resuscitated, check urine osm and sodium, follow serial chem panels to avoid too rapid a correction   3. Alcohol abuse  - No signs of intoxication or withdrawal on admission  - Monitor with CIWA, supplement vitamins, use Ativan as-needed   4. CAD - Severe right chest pain related to #1, but no anginal complaints  - Continue statin    5. History of CVA  - No focal  deficits appreciated  - Continue statin    6. Incidental CT findings - Rounded structure noted in anterior mediastinum, thought to reflect saccular aneurysm, and non-emergent CT with contrast recommended    DVT prophylaxis: SCD's  Code Status: Full  Family Communication: Discussed with patient  Consults called: Trauma surgery Admission status: Inpatient     [name], MD [facility] Pager [phone]  If 7PM-7AM, please contact night-coverage [email or web address] Password [email or web address]  05/14/2018, 2:11 AM

## 2018-05-14 NOTE — Plan of Care (Signed)
  Problem: Clinical Measurements: Goal: Ability to maintain clinical measurements within normal limits will improve Outcome: Progressing   Problem: Activity: Goal: Risk for activity intolerance will decrease Outcome: Progressing   Problem: Nutrition: Goal: Adequate nutrition will be maintained Outcome: Progressing   Problem: Coping: Goal: Level of anxiety will decrease Outcome: Progressing   Problem: Pain Managment: Goal: General experience of comfort will improve Outcome: Progressing   Problem: Safety: Goal: Ability to remain free from injury will improve Outcome: Progressing   

## 2018-05-15 ENCOUNTER — Inpatient Hospital Stay: Payer: Self-pay

## 2018-05-15 LAB — BASIC METABOLIC PANEL
Anion gap: 13 (ref 5–15)
BUN: 6 mg/dL — ABNORMAL LOW (ref 8–23)
CO2: 23 mmol/L (ref 22–32)
Calcium: 8.4 mg/dL — ABNORMAL LOW (ref 8.9–10.3)
Chloride: 92 mmol/L — ABNORMAL LOW (ref 98–111)
Creatinine, Ser: 0.73 mg/dL (ref 0.61–1.24)
GFR calc Af Amer: >60 mL/min (ref >60)
GFR calc non Af Amer: >60 mL/min (ref >60)
Glucose, Bld: 103 mg/dL — ABNORMAL HIGH (ref 70–99)
Potassium: 4 mmol/L (ref 3.5–5.1)
Sodium: 128 mmol/L — ABNORMAL LOW (ref 135–145)

## 2018-05-15 LAB — CBC
HCT: 39.6 % (ref 39.0–52.0)
Hemoglobin: 13.9 g/dL (ref 13.0–17.0)
MCH: 35.4 pg — ABNORMAL HIGH (ref 26.0–34.0)
MCHC: 35.1 g/dL (ref 30.0–36.0)
MCV: 100.8 fL — ABNORMAL HIGH (ref 80.0–100.0)
Platelets: 224 10*3/uL (ref 150–400)
RBC: 3.93 MIL/uL — ABNORMAL LOW (ref 4.22–5.81)
RDW: 11.7 % (ref 11.5–15.5)
WBC: 13.2 10*3/uL — ABNORMAL HIGH (ref 4.0–10.5)
nRBC: 0 % (ref 0.0–0.2)

## 2018-05-15 MED ORDER — DM-GUAIFENESIN ER 30-600 MG PO TB12
1.0000 | ORAL_TABLET | Freq: Two times a day (BID) | ORAL | Status: DC
  Administered 2018-05-15 – 2018-05-16 (×4): 1 via ORAL
  Filled 2018-05-15 (×4): qty 1

## 2018-05-15 MED ORDER — IPRATROPIUM-ALBUTEROL 0.5-2.5 (3) MG/3ML IN SOLN
3.0000 mL | Freq: Once | RESPIRATORY_TRACT | Status: AC
  Administered 2018-05-15: 3 mL via RESPIRATORY_TRACT
  Filled 2018-05-15: qty 3

## 2018-05-15 MED ORDER — OXYCODONE HCL 5 MG PO TABS
5.0000 mg | ORAL_TABLET | ORAL | Status: DC | PRN
  Administered 2018-05-15 – 2018-05-16 (×3): 5 mg via ORAL
  Filled 2018-05-15 (×3): qty 1

## 2018-05-15 MED ORDER — SPIRITUS FRUMENTI
1.0000 | Freq: Three times a day (TID) | ORAL | Status: DC
  Administered 2018-05-15 – 2018-05-17 (×4): 1 via ORAL
  Filled 2018-05-15 (×12): qty 1

## 2018-05-15 MED ORDER — FENTANYL CITRATE (PF) 100 MCG/2ML IJ SOLN
50.0000 ug | INTRAMUSCULAR | Status: DC | PRN
  Administered 2018-05-15 – 2018-05-16 (×3): 75 ug via INTRAVENOUS
  Filled 2018-05-15 (×3): qty 2

## 2018-05-15 MED ORDER — SODIUM CHLORIDE 0.9% FLUSH
10.0000 mL | INTRAVENOUS | Status: DC | PRN
  Administered 2018-05-18: 10 mL
  Administered 2018-05-18: 20 mL
  Filled 2018-05-15 (×2): qty 40

## 2018-05-15 MED ORDER — IPRATROPIUM-ALBUTEROL 0.5-2.5 (3) MG/3ML IN SOLN
3.0000 mL | Freq: Four times a day (QID) | RESPIRATORY_TRACT | Status: DC | PRN
  Administered 2018-05-18 – 2018-05-31 (×3): 3 mL via RESPIRATORY_TRACT
  Filled 2018-05-15 (×3): qty 3

## 2018-05-15 MED ORDER — METHOCARBAMOL 750 MG PO TABS
750.0000 mg | ORAL_TABLET | Freq: Three times a day (TID) | ORAL | Status: DC
  Administered 2018-05-15 – 2018-05-16 (×5): 750 mg via ORAL
  Filled 2018-05-15 (×5): qty 1

## 2018-05-15 NOTE — Progress Notes (Signed)
Patient lost IV access, IV RN and bedside nurse attempted restart several times with no success. Will notify md.
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
## 2018-05-15 NOTE — Plan of Care (Signed)

## 2018-05-15 NOTE — Progress Notes (Signed)
[facility]                                                                              Patient Demographics  [name] Pace, is a 67 y.o. male, DOB - 1950/07/14, WJX:[id number]  Admit date - 05/13/2018   Admitting Physician [name], MD  Outpatient Primary MD for the patient is System, Provider Not In  Outpatient specialists:   LOS - 1  days   Medical records reviewed and are as summarized below:    Chief Complaint  Patient presents with  . Fall       Brief summary   Patient is a 67 year old male with history of alcohol abuse, CAD, history of CVA presented to ED with severe right-sided chest wall pain after a fall on the night of 12/7.  Patient reported that he slipped at home striking his right chest against a nightstand.  Denied hitting his head or losing consciousness but reported severe pleuritic pain in the right chest since then.  Reports drinking 6 pack of beer most nights. In ED afebrile, O2 sats low 80s on room air, sodium 123, bicarb 19, glucose 110, leukocytosis with WBCs 13.5, macrocytosis. CT chest showed multiple mildly displaced right rib fractures involving the right 5th-11th ribs, minimal right apical pneumothorax and rounded structure in the anterior mediastinum likely reflecting a saccular aneurysm. Patient was admitted for further work-up.   Assessment & Plan    Principal Problem:   Multiple right rib fractures, 5-11  -Status post mechanical fall, presented with severe right chest pain -CT showed multiple displaced rib fractures 5-11 on the right, minimal right apical pneumothorax -Trauma surgery consulted, appreciate recommendations -Continue pain control, incentive spirometry, CK 218 PT OT evaluation  Active Problems:   Pneumothorax, closed, traumatic, small  -CT chest showed multiple rib fractures with minimal right apical pneumothorax -Repeat chest x-ray 12/9 showed a resolved pneumothorax    Hyponatremia -Possibly beer  potomania, also appears to be hypovolemic, improving, 123 at the time of admission -Received 1.5 L normal saline, serum osmolarity measured 260, unknown ethanol level, urine osmolality 422 with a urine sodium less than 10 -Sodium improving, 128, much more alert and awake today, on gentle hydration, likely also has underlying SIADH due to pulmonary process    Alcohol abuse -Continue CIWA scale with Ativan, CIWA 5 -Continue thiamine, folate    CAD (coronary artery disease) -No acute issues    History of stroke -Continue statin  Incidental saccular aneurysm CT incidentally showed rounded structure in the anterior mediastinum likely reflecting a saccular aneurysm. Nonemergent contrast CT recommended, outpatient follow-up with CT surgery  Code Status: Full code DVT Prophylaxis:  SCD's Family Communication: No family member at the bedside   Disposition Plan: Too lethargic to complete PT yesterday,  Time Spent in minutes 25 minutes  Procedures:  CT chest without contrast  Consultants:   Trauma surgery  Antimicrobials:      Medications  Scheduled Meds: . acetaminophen  1,000 mg Oral Q6H  . atorvastatin  40 mg Oral q1800  . dextromethorphan-guaiFENesin  1 tablet Oral BID  . docusate sodium  100 mg Oral BID  .  folic acid  1 mg Oral Daily  . gabapentin  300 mg Oral TID  . Influenza vac split quadrivalent PF  0.5 mL Intramuscular Tomorrow-1000  . LORazepam  0-4 mg Intravenous Q6H   Followed by  . [START ON 05/16/2018] LORazepam  0-4 mg Intravenous Q12H  . methocarbamol  750 mg Oral TID  . multivitamin with minerals  1 tablet Oral Daily  . pneumococcal 23 valent vaccine  0.5 mL Intramuscular Tomorrow-1000  . sodium chloride flush  3 mL Intravenous Q12H  . sodium chloride flush  3 mL Intravenous Q12H  . spiritus frumenti  1 each Oral TID WC  . thiamine  100 mg Oral Daily   Or  . thiamine  100 mg Intravenous Daily   Continuous Infusions: . sodium chloride    . sodium  chloride     PRN Meds:.sodium chloride, fentaNYL (SUBLIMAZE) injection, ipratropium-albuterol, LORazepam **OR** LORazepam, ondansetron **OR** ondansetron (ZOFRAN) IV, oxyCODONE, polyethylene glycol, sodium chloride flush, sodium chloride flush   Antibiotics   Anti-infectives (From admission, onward)   None        Subjective:   [name] Pace was seen and examined today.  Still having a lot of chest pain from the rib fractures but better from yesterday, alert and oriented today.  Denies any nausea vomiting abdominal pain, fevers  Objective:   Vitals:   05/14/18 2116 05/14/18 2133 05/15/18 0450 05/15/18 0930  BP:  (!) 145/62 112/60   Pulse: 90 91 86   Resp:      Temp:   98.4 F (36.9 C)   TempSrc:   Oral   SpO2: 98%  100% 98%  Weight:      Height:        Intake/Output Summary (Last 24 hours) at 05/15/2018 1204 Last data filed at 05/15/2018 0920 Gross per 24 hour  Intake 580 ml  Output 200 ml  Net 380 ml     Wt Readings from Last 3 Encounters:  05/14/18 72.6 kg  07/04/14 69.6 kg  06/14/14 67.6 kg    Physical Exam  General: Alert and oriented x 3, NAD  Eyes:   HEENT:    Cardiovascular: S1 S2 clear Regular rate and rhythm. No pedal edema b/l  Respiratory: Decreased breath sound at the bases but mostly clear, chest wall tenderness   Gastrointestinal: Soft, nontender, nondistended, + bowel sounds  Ext: no pedal edema bilaterally  Neuro: No new deficits  Musculoskeletal: No digital cyanosis, clubbing  Skin: No rashes  Psych: Normal affect and demeanor, alert and oriented x3    Data Reviewed:  I have personally reviewed following labs and imaging studies  Micro Results No results found for this or any previous visit (from the past 240 hour(s)).  Radiology Reports Ct Chest Wo Contrast  Result Date: 05/14/2018 CLINICAL DATA:  67 year old male with fall and trauma to the right chest wall. EXAM: CT CHEST WITHOUT CONTRAST TECHNIQUE: Multidetector CT  imaging of the chest was performed following the standard protocol without IV contrast. COMPARISON:  Chest radiograph dated 07/02/2014 FINDINGS: Evaluation of this exam is limited in the absence of intravenous contrast. Cardiovascular: Top-normal cardiac size. No pericardial effusion. Multi vessel coronary vascular calcification and postsurgical changes of CABG. A 2.3 x 2.1 cm rounded structure in the anterior mediastinum anterior to the pulmonary trunk (series 3, image 70) is not characterized on this noncontrast CT but most likely represents a saccular aneurysm. This can be further evaluated with contrast enhanced CT. There is mild atherosclerotic calcification of  the thoracic aorta. The central pulmonary arteries are grossly unremarkable. A left subclavian stent is not well evaluated on this noncontrast CT. Mediastinum/Nodes: No hilar or mediastinal adenopathy. Small hiatal hernia. No mediastinal fluid collection. Lungs/Pleura: There is a background of emphysema. Minimal bibasilar atelectatic changes. There is a trace right pleural effusion. Minimal pneumothorax in the right apex measures 3 mm in thickness. No focal consolidation. Small amount of mucus material noted in the central airway. The airways are otherwise patent. Upper Abdomen: Cholecystectomy. The visualized upper abdomen is otherwise unremarkable. Musculoskeletal: Multiple mildly displaced right rib fractures involving the right 5th-11th ribs. There is a small amount of chest wall emphysema. Right scapular fixation screws. IMPRESSION: 1. Multiple mildly displaced right rib fractures involving the right 5th-11th ribs. 2. Minimal right apical pneumothorax. 3. Rounded structure in the anterior mediastinum, likely a saccular aneurysm. This can be further evaluated with contrast enhanced CT on a nonemergent basis. These results were called by telephone at the time of interpretation on 05/14/2018 at 12:18 am to Dr. Addison Lank , who verbally acknowledged  these results. Electronically Signed   By: Anner Crete M.D.   On: 05/14/2018 00:20   Dg Chest Port 1 View  Result Date: 05/14/2018 CLINICAL DATA:  67 year old male with right rib fractures. EXAM: PORTABLE CHEST 1 VIEW COMPARISON:  Chest CT dated 05/13/2018 FINDINGS: No visible pneumothorax. Right rib fractures with associated soft tissue emphysema of the right lateral chest wall. Bibasilar atelectasis. Cardiomegaly, median sternotomy wires, and CABG vascular clips. Left subclavian artery stent. IMPRESSION: 1. No visible pneumothorax. 2. Right rib fractures with associated soft tissue emphysema of the right lateral chest wall. Electronically Signed   By: Anner Crete M.D.   On: 05/14/2018 05:56   [place] Ekg Site Rite  Result Date: 05/15/2018 If Site Rite image not attached, placement could not be confirmed due to current cardiac rhythm.   Lab Data:  CBC: Recent Labs  Lab 05/14/18 0025 05/14/18 0626 05/15/18 0134  WBC 13.5* 10.9* 13.2*  NEUTROABS 11.4* 8.9*  --   HGB 14.9 14.0 13.9  HCT 42.6 40.1 39.6  MCV 102.2* 99.3 100.8*  PLT 203 239 341   Basic Metabolic Panel: Recent Labs  Lab 05/14/18 0025 05/14/18 0626 05/14/18 1411 05/14/18 2037 05/15/18 0134  NA 123* 125* 125* 125* 128*  K 3.7 3.7 4.1 4.0 4.0  CL 88* 90* 90* 89* 92*  CO2 19* 23 24 24 23   GLUCOSE 110* 99 108* 111* 103*  BUN <5* <5* 5* 5* 6*  CREATININE 0.52* 0.66 0.64 0.69 0.73  CALCIUM 8.1* 7.9* 8.3* 8.7* 8.4*   GFR: Estimated Creatinine Clearance: 94.5 mL/min (by C-G formula based on SCr of 0.73 mg/dL). Liver Function Tests: Recent Labs  Lab 05/14/18 0639  AST 34  ALT 32  ALKPHOS 57  BILITOT 1.4*  PROT 6.1*  ALBUMIN 3.0*   No results for input(s): LIPASE, AMYLASE in the last 168 hours. No results for input(s): AMMONIA in the last 168 hours. Coagulation Profile: No results for input(s): INR, PROTIME in the last 168 hours. Cardiac Enzymes: Recent Labs  Lab 05/14/18 1411  CKTOTAL 218   BNP  (last 3 results) No results for input(s): PROBNP in the last 8760 hours. HbA1C: No results for input(s): HGBA1C in the last 72 hours. CBG: No results for input(s): GLUCAP in the last 168 hours. Lipid Profile: No results for input(s): CHOL, HDL, LDLCALC, TRIG, CHOLHDL, LDLDIRECT in the last 72 hours. Thyroid Function Tests: No results for input(s):  TSH, T4TOTAL, FREET4, T3FREE, THYROIDAB in the last 72 hours. Anemia Panel: No results for input(s): VITAMINB12, FOLATE, FERRITIN, TIBC, IRON, RETICCTPCT in the last 72 hours. Urine analysis:    Component Value Date/Time   COLORURINE YELLOW 06/14/2014 2135   APPEARANCEUR CLEAR 06/14/2014 2135   LABSPEC 1.019 06/14/2014 2135   PHURINE 6.0 06/14/2014 2135   GLUCOSEU NEGATIVE 06/14/2014 2135   HGBUR NEGATIVE 06/14/2014 2135   BILIRUBINUR NEGATIVE 06/14/2014 2135   KETONESUR 40 (A) 06/14/2014 2135   PROTEINUR NEGATIVE 06/14/2014 2135   UROBILINOGEN 0.2 06/14/2014 2135   NITRITE NEGATIVE 06/14/2014 2135   LEUKOCYTESUR NEGATIVE 06/14/2014 2135     [name] Pace M.D. [facility] 05/15/2018, 12:04 PM  Pager: [phone] Between 7am to 7pm - call Pager - [phone]  After 7pm go to [email or web address] - password [email or web address]  Call night coverage person covering after 7pm

## 2018-05-15 NOTE — Progress Notes (Signed)
[facility] Progress Note     Subjective: CC-  Patient more awake, alert, and interactive today. Continues to have a lot of pain from rib fractures. States that he feels SOB due to the pain. Also feels like he has a lot of mucus that he cannot cough up. Pulling 1300 on IS. Wants to sit up and drink coffee.  Objective: Vital signs in last 24 hours: Temp:  [97.9 F (36.6 C)-99.2 F (37.3 C)] 98.4 F (36.9 C) (12/10 0450) Pulse Rate:  [84-95] 86 (12/10 0450) Resp:  [17-22] 17 (12/09 2054) BP: (80-146)/(60-68) 112/60 (12/10 0450) SpO2:  [85 %-100 %] 100 % (12/10 0450) Last BM Date: (Pt is unsure of last BM)  Intake/Output from previous day: 12/09 0701 - 12/10 0700 In: 520 [P.O.:120; I.V.:400] Out: 375 [Urine:375] Intake/Output this shift: No intake/output data recorded.  PE: Gen:  Alert, NAD HEENT: EOM's intact, pupils equal and round Card:  RRR Pulm:  effort normal, R sided chest wall tenderness, mild rhonchi and expiratory wheezing bilaterally, subq emphysema on the R improving, pulling 1300 on IS Abd: Soft, NT/ND, +BS, no HSM Ext:  Calves soft and nontender Skin: no rashes noted, warm and dry  Lab Results:  Recent Labs    05/14/18 0626 05/15/18 0134  WBC 10.9* 13.2*  HGB 14.0 13.9  HCT 40.1 39.6  PLT 239 224   BMET Recent Labs    05/14/18 2037 05/15/18 0134  NA 125* 128*  K 4.0 4.0  CL 89* 92*  CO2 24 23  GLUCOSE 111* 103*  BUN 5* 6*  CREATININE 0.69 0.73  CALCIUM 8.7* 8.4*   PT/INR No results for input(s): LABPROT, INR in the last 72 hours. CMP     Component Value Date/Time   NA 128 (L) 05/15/2018 0134   K 4.0 05/15/2018 0134   CL 92 (L) 05/15/2018 0134   CO2 23 05/15/2018 0134   GLUCOSE 103 (H) 05/15/2018 0134   BUN 6 (L) 05/15/2018 0134   CREATININE 0.73 05/15/2018 0134   CALCIUM 8.4 (L) 05/15/2018 0134   PROT 6.1 (L) 05/14/2018 0639   ALBUMIN 3.0 (L) 05/14/2018 0639   AST 34 05/14/2018 0639   ALT 32 05/14/2018 0639   ALKPHOS 57 05/14/2018 0639   BILITOT 1.4 (H) 05/14/2018 0639   GFRNONAA >60 05/15/2018 0134   GFRAA >60 05/15/2018 0134   Lipase     Component Value Date/Time   LIPASE 18 06/14/2014 1912       Studies/Results: Ct Chest Wo Contrast  Result Date: 05/14/2018 CLINICAL DATA:  67 year old male with fall and trauma to the right chest wall. EXAM: CT CHEST WITHOUT CONTRAST TECHNIQUE: Multidetector CT imaging of the chest was performed following the standard protocol without IV contrast. COMPARISON:  Chest radiograph dated 07/02/2014 FINDINGS: Evaluation of this exam is limited in the absence of intravenous contrast. Cardiovascular: Top-normal cardiac size. No pericardial effusion. Multi vessel coronary vascular calcification and postsurgical changes of CABG. A 2.3 x 2.1 cm rounded structure in the anterior mediastinum anterior to the pulmonary trunk (series 3, image 70) is not characterized on this noncontrast CT but most likely represents a saccular aneurysm. This can be further evaluated with contrast enhanced CT. There is mild atherosclerotic calcification of the thoracic aorta. The central pulmonary arteries are grossly unremarkable. A left subclavian stent is not well evaluated on this noncontrast CT. Mediastinum/Nodes: No hilar or mediastinal adenopathy. Small hiatal hernia. No mediastinal fluid collection. Lungs/Pleura: There is a background of emphysema. Minimal bibasilar  atelectatic changes. There is a trace right pleural effusion. Minimal pneumothorax in the right apex measures 3 mm in thickness. No focal consolidation. Small amount of mucus material noted in the central airway. The airways are otherwise patent. Upper Abdomen: Cholecystectomy. The visualized upper abdomen is otherwise unremarkable. Musculoskeletal: Multiple mildly displaced right rib fractures involving the right 5th-11th ribs. There is a small amount of chest wall emphysema. Right scapular fixation screws. IMPRESSION: 1. Multiple  mildly displaced right rib fractures involving the right 5th-11th ribs. 2. Minimal right apical pneumothorax. 3. Rounded structure in the anterior mediastinum, likely a saccular aneurysm. This can be further evaluated with contrast enhanced CT on a nonemergent basis. These results were called by telephone at the time of interpretation on 05/14/2018 at 12:18 am to Dr. Addison Lank , who verbally acknowledged these results. Electronically Signed   By: Anner Crete M.D.   On: 05/14/2018 00:20   Dg Chest Port 1 View  Result Date: 05/14/2018 CLINICAL DATA:  67 year old male with right rib fractures. EXAM: PORTABLE CHEST 1 VIEW COMPARISON:  Chest CT dated 05/13/2018 FINDINGS: No visible pneumothorax. Right rib fractures with associated soft tissue emphysema of the right lateral chest wall. Bibasilar atelectasis. Cardiomegaly, median sternotomy wires, and CABG vascular clips. Left subclavian artery stent. IMPRESSION: 1. No visible pneumothorax. 2. Right rib fractures with associated soft tissue emphysema of the right lateral chest wall. Electronically Signed   By: Anner Crete M.D.   On: 05/14/2018 05:56   [place] Ekg Site Rite  Result Date: 05/15/2018 If Site Rite image not attached, placement could not be confirmed due to current cardiac rhythm.   Anti-infectives: Anti-infectives (From admission, onward)   None       Assessment/Plan H/o CAD, prior CABG H/o CVA, prior carotid stent COPD Tobacco abuse Alcohol abuse - CIWA, beer TID Hyponatremia - Na 128, improving, per primary Incidental CT finding ?saccular aneurysm - per primary  R rib fxs 5-11 with apical PNX, subcutaneous emphysema  - f/u CXR stable without pneumothorax - Pulmonary toilet, incentive spirometer - multimodal pain control: scheduled Tylenol, Gabapentin, Robaxin with PRN fentanyl and oxycodone - PT/OT  ID - none FEN - IVF, reg diet, beer TID VTE - SCDs Foley - none  Plan - Add mucinex and duonebs. Continue  pulmonary toilet, ambulation. PT/OT pending. Schedule robaxin for better pain control.   LOS: 1 day    Wellington [place] , [facility] 05/15/2018, 8:41 AM Pager: [phone] Mon 7:00 am -11:30 AM Tues-Fri 7:00 am-4:30 pm Sat-Sun 7:00 am-11:30 am
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
## 2018-05-16 LAB — BASIC METABOLIC PANEL
Anion gap: 6 (ref 5–15)
CO2: 26 mmol/L (ref 22–32)
Calcium: 8 mg/dL — ABNORMAL LOW (ref 8.9–10.3)
Chloride: 94 mmol/L — ABNORMAL LOW (ref 98–111)
Creatinine, Ser: 0.7 mg/dL (ref 0.61–1.24)
GFR calc Af Amer: >60 mL/min (ref >60)
GFR calc non Af Amer: >60 mL/min (ref >60)
Glucose, Bld: 116 mg/dL — ABNORMAL HIGH (ref 70–99)
Potassium: 3.9 mmol/L (ref 3.5–5.1)
Sodium: 126 mmol/L — ABNORMAL LOW (ref 135–145)

## 2018-05-16 LAB — CBC
HCT: 38.8 % — ABNORMAL LOW (ref 39.0–52.0)
Hemoglobin: 13.4 g/dL (ref 13.0–17.0)
MCH: 35.5 pg — ABNORMAL HIGH (ref 26.0–34.0)
MCHC: 34.5 g/dL (ref 30.0–36.0)
MCV: 102.9 fL — ABNORMAL HIGH (ref 80.0–100.0)
Platelets: 228 10*3/uL (ref 150–400)
RBC: 3.77 MIL/uL — ABNORMAL LOW (ref 4.22–5.81)
RDW: 11.9 % (ref 11.5–15.5)
WBC: 11.1 10*3/uL — ABNORMAL HIGH (ref 4.0–10.5)
nRBC: 0 % (ref 0.0–0.2)

## 2018-05-16 MED ORDER — LORAZEPAM 2 MG/ML IJ SOLN
2.0000 mg | INTRAMUSCULAR | Status: DC | PRN
  Administered 2018-05-16 – 2018-05-19 (×19): 2 mg via INTRAVENOUS
  Filled 2018-05-16: qty 1
  Filled 2018-05-16: qty 2
  Filled 2018-05-16 (×17): qty 1
  Filled 2018-05-16: qty 2

## 2018-05-16 MED ORDER — POLYETHYLENE GLYCOL 3350 17 G PO PACK
17.0000 g | PACK | Freq: Every day | ORAL | Status: DC
  Administered 2018-05-16 – 2018-05-23 (×6): 17 g via ORAL
  Filled 2018-05-16 (×6): qty 1

## 2018-05-16 MED ORDER — LORAZEPAM 2 MG/ML IJ SOLN
INTRAMUSCULAR | Status: AC
  Administered 2018-05-16: 18:00:00
  Filled 2018-05-16: qty 1

## 2018-05-16 MED ORDER — LORAZEPAM 2 MG/ML IJ SOLN
2.0000 mg | Freq: Once | INTRAMUSCULAR | Status: AC
  Administered 2018-05-16: 2 mg via INTRAMUSCULAR
  Filled 2018-05-16: qty 1

## 2018-05-16 MED ORDER — LORAZEPAM 2 MG/ML IJ SOLN
2.0000 mg | Freq: Once | INTRAMUSCULAR | Status: AC
  Administered 2018-05-16: 2 mg via INTRAMUSCULAR

## 2018-05-16 MED ORDER — LORAZEPAM 2 MG/ML IJ SOLN
1.0000 mg | Freq: Once | INTRAMUSCULAR | Status: AC
  Administered 2018-05-16: 1 mg via INTRAMUSCULAR

## 2018-05-16 MED ORDER — LORAZEPAM 2 MG/ML IJ SOLN
INTRAMUSCULAR | Status: AC
  Administered 2018-05-16: 19:00:00
  Filled 2018-05-16: qty 1

## 2018-05-16 MED ORDER — LORAZEPAM 2 MG/ML IJ SOLN
3.0000 mg | Freq: Once | INTRAMUSCULAR | Status: AC
  Administered 2018-05-16: 3 mg via INTRAVENOUS

## 2018-05-16 MED ORDER — DIAZEPAM 5 MG PO TABS
5.0000 mg | ORAL_TABLET | Freq: Three times a day (TID) | ORAL | Status: DC
  Administered 2018-05-16 – 2018-05-21 (×10): 5 mg via ORAL
  Filled 2018-05-16 (×10): qty 1

## 2018-05-16 NOTE — Significant Event (Signed)
Rapid Response Event Note PP made me aware of need for progressive care bed  Overview:  on arrival pt supine in bed, mildly diaphoretic, thrashing all over the place, talking to his girlfriend whom is not in the room. Pt agitated and restless, calling the staff names, trying to kick and hit staff. BUE restraints applied prior to arrival. Oriented to self only. CIWA 26    Initial Focused Assessment:   Interventions: 13 mg Ativan IVP with some relief Placed on 4L [place]   Event Summary:   total time: [id number]      at          [facility], Sela Hua

## 2018-05-16 NOTE — Progress Notes (Signed)
Pt attempted to hit NT Porsha at bedside, grabbed her hand and said "get your f**^ing hands away from me"

## 2018-05-16 NOTE — Progress Notes (Signed)
OT Cancellation Note  Patient Details Name: YACOB WILKERSON MRN: [id number] DOB: 27-Jul-1950   Cancelled Treatment:    Reason Eval/Treat Not Completed: Patient not medically ready(transfer to step down with CIWA) OT spoke with RN staff requesting hold at this time.   Richelle Ito, OTR/L  Acute Rehabilitation Services Pager: [phone] Office: [phone] .  05/16/2018, 1:43 PM

## 2018-05-16 NOTE — Progress Notes (Signed)
CIWA 26. Dr. Tana Coast notified that patient pulling off telemetry and Oxygen. 3 mg iv ativan given Will continue to monitor patient.

## 2018-05-16 NOTE — Plan of Care (Signed)
°  Problem: Clinical Measurements: °Goal: Will remain free from infection °Outcome: Progressing °Goal: Respiratory complications will improve °Outcome: Progressing °  °

## 2018-05-16 NOTE — Progress Notes (Signed)
  [facility] Progress Note     Subjective: CC-  Patient worked with PT/OT yesterday, requiring mod A assist. Recommending SNF unless he progresses to more independence. States that pain medication does help with rib fractures, but they still hurt. Reports some persist SOB due to the pain. On 2L O2.  Wheezing improved. Pulling 750 on IS this morning.  Objective: Vital signs in last 24 hours: Temp:  [97.8 F (36.6 C)-99.3 F (37.4 C)] 99.3 F (37.4 C) (12/11 0547) Pulse Rate:  [77-80] 77 (12/11 0547) Resp:  [18] 18 (12/11 0547) BP: (126-140)/(75-80) 140/80 (12/11 0547) SpO2:  [93 %-100 %] 97 % (12/11 0547) Last BM Date: (Pt is unsure of last BM)  Intake/Output from previous day: 12/10 0701 - 12/11 0700 In: 960 [P.O.:360; I.V.:600] Out: 400 [Urine:400] Intake/Output this shift: No intake/output data recorded.  PE: Gen: Alert, NAD HEENT: EOM's intact, pupils equal and round Card: RRR Pulm: effort normal, R sided chest wall tenderness, mild rhonchi bilaterally, no wheezing, pulling 750 on IS Abd: Soft, NT/ND, +BS, no HSM ZWC:HENIDP soft and nontender Skin: no rashes noted, warm and dry  Lab Results:  Recent Labs    05/15/18 0134 05/16/18 0445  WBC 13.2* 11.1*  HGB 13.9 13.4  HCT 39.6 38.8*  PLT 224 228   BMET Recent Labs    05/15/18 0134 05/16/18 0445  NA 128* 126*  K 4.0 3.9  CL 92* 94*  CO2 23 26  GLUCOSE 103* 116*  BUN 6* <5*  CREATININE 0.73 0.70  CALCIUM 8.4* 8.0*   PT/INR No results for input(s): LABPROT, INR in the last 72 hours. CMP     Component Value Date/Time   NA 126 (L) 05/16/2018 0445   K 3.9 05/16/2018 0445   CL 94 (L) 05/16/2018 0445   CO2 26 05/16/2018 0445   GLUCOSE 116 (H) 05/16/2018 0445   BUN <5 (L) 05/16/2018 0445   CREATININE 0.70 05/16/2018 0445   CALCIUM 8.0 (L) 05/16/2018 0445   PROT 6.1 (L) 05/14/2018 0639   ALBUMIN 3.0 (L) 05/14/2018 0639   AST 34 05/14/2018 0639   ALT 32 05/14/2018 0639   ALKPHOS  57 05/14/2018 0639   BILITOT 1.4 (H) 05/14/2018 0639   GFRNONAA >60 05/16/2018 0445   GFRAA >60 05/16/2018 0445   Lipase     Component Value Date/Time   LIPASE 18 06/14/2014 1912       Studies/Results: [place] Ekg Site Rite  Result Date: 05/15/2018 If Site Rite image not attached, placement could not be confirmed due to current cardiac rhythm.   Anti-infectives: Anti-infectives (From admission, onward)   None       Assessment/Plan H/o CAD, prior CABG H/o CVA, prior carotid stent COPD Tobacco abuse Alcohol abuse - CIWA, beer TID Hyponatremia - Na 126, per primary Incidental CT finding ?saccular aneurysm - per primary  R rib fxs 5-11 with apical PNX,subcutaneous emphysema - f/u CXR stable without pneumothorax - Pulmonary toilet, incentive spirometer - multimodal pain control: scheduled Tylenol, Gabapentin, Robaxin with PRN fentanyl and oxycodone - PT/OT  ID -none FEN -IVF, reg diet, beer TID VTE -SCDs Foley -none  Plan - Continue plan as above, encourage patient to use IS more frequently and ambulate/OOB. Continue PT/OT, HH vs SNF depending on progress.   LOS: 2 days    Wellington Hampshire , [facility] 05/16/2018, 7:50 AM Pager: [phone] Mon 7:00 am -11:30 AM Tues-Fri 7:00 am-4:30 pm Sat-Sun 7:00 am-11:30 am

## 2018-05-16 NOTE — Progress Notes (Signed)
PT Cancellation Note  Patient Details Name: [name] Pace MRN: [id number] DOB: 09-17-1950   Cancelled Treatment:    Reason Eval/Treat Not Completed: Other (comment).  Nursing asked PT to hold visit as pt is at [place] 26 with Ativan, combative.  Will try again at another time.   Ramond Dial 05/16/2018, 1:03 PM   Mee Hives, PT MS Acute Rehab Dept. Number: [place] and [place]

## 2018-05-16 NOTE — Progress Notes (Addendum)
[facility]                                                                              Patient Demographics  [name] Pace, is a 67 y.o. male, DOB - 10/30/1950, FYB:[id number]  Admit date - 05/13/2018   Admitting Physician [name], MD  Outpatient Primary MD for the patient is System, Provider Not In  Outpatient specialists:   LOS - 2  days   Medical records reviewed and are as summarized below:    Chief Complaint  Patient presents with  . Fall       Brief summary   Patient is a 67 year old male with history of alcohol abuse, CAD, history of CVA presented to ED with severe right-sided chest wall pain after a fall on the night of 12/7.  Patient reported that he slipped at home striking his right chest against a nightstand.  Denied hitting his head or losing consciousness but reported severe pleuritic pain in the right chest since then.  Reports drinking 6 pack of beer most nights. In ED afebrile, O2 sats low 80s on room air, sodium 123, bicarb 19, glucose 110, leukocytosis with WBCs 13.5, macrocytosis. CT chest showed multiple mildly displaced right rib fractures involving the right 5th-11th ribs, minimal right apical pneumothorax and rounded structure in the anterior mediastinum likely reflecting a saccular aneurysm. Patient was admitted for further work-up.   Assessment & Plan    Principal Problem:   Multiple right rib fractures, 5-11  -Status post mechanical fall, presented with severe right chest pain -CT showed multiple displaced rib fractures 5-11 on the right, minimal right apical pneumothorax -Trauma surgery consulted, appreciate recommendations -Continue pain control, incentive spirometry, CK 218 PT OT evaluation when able to  Active Problems: Acute metabolic encephalopathy, acute alcohol withdrawals with delirium, DT's At the time of my examination, delirious, agitated and combative, disoriented, pulled his IV, CIWA is elevated, now in  20's Changed to Ativan IV to stepdown protocol, also placed on Valium 5 mg p.o. 3 times daily Transfer to stepdown unit, if no significant improvement and CIWA's remain high, will need ICU level for Precedex.  Discussed with CCM, Dr. Nelda Marseille, recommended continue current management and transfer to stepdown unit, if not well controlled with stepdown CIWA protocol, will assess.    Pneumothorax, closed, traumatic, small  -CT chest showed multiple rib fractures with minimal right apical pneumothorax -Repeat chest x-ray 12/9 showed a resolved pneumothorax    Hyponatremia -Possibly beer potomania, also appears to be hypovolemic, with a component of SIADH from pulmonary competent, 123 at the time of admission -Sodium 126, currently lost IV access, agitated, will need IV fluids    CAD (coronary artery disease) -No acute issues    History of stroke -Continue statin  Incidental saccular aneurysm CT incidentally showed rounded structure in the anterior mediastinum likely reflecting a saccular aneurysm. Nonemergent contrast CT recommended, outpatient follow-up with CT surgery  Code Status: Full code DVT Prophylaxis:  SCD's Family Communication: No family member at the bedside   Disposition Plan: Transfer to stepdown unit for alcohol withdrawals and DTs  Time Spent in minutes 25 minutes  Procedures:  CT chest without contrast  Consultants:   Trauma surgery  Antimicrobials:      Medications  Scheduled Meds: . acetaminophen  1,000 mg Oral Q6H  . atorvastatin  40 mg Oral q1800  . dextromethorphan-guaiFENesin  1 tablet Oral BID  . diazepam  5 mg Oral TID  . docusate sodium  100 mg Oral BID  . folic acid  1 mg Oral Daily  . gabapentin  300 mg Oral TID  . methocarbamol  750 mg Oral TID  . multivitamin with minerals  1 tablet Oral Daily  . polyethylene glycol  17 g Oral Daily  . sodium chloride flush  3 mL Intravenous Q12H  . sodium chloride flush  3 mL Intravenous Q12H  .  spiritus frumenti  1 each Oral TID WC  . thiamine  100 mg Oral Daily   Or  . thiamine  100 mg Intravenous Daily   Continuous Infusions: . sodium chloride    . sodium chloride 100 mL/hr at 05/15/18 2351   PRN Meds:.sodium chloride, fentaNYL (SUBLIMAZE) injection, ipratropium-albuterol, LORazepam, ondansetron **OR** ondansetron (ZOFRAN) IV, oxyCODONE, sodium chloride flush, sodium chloride flush   Antibiotics   Anti-infectives (From admission, onward)   None        Subjective:   Tildon Silveria was seen and examined today.  Combative and agitated, pulled his IV, trying to get out of the bed, difficult to control.    Objective:   Vitals:   05/15/18 1314 05/15/18 2118 05/16/18 0547 05/16/18 1312  BP: 126/76 138/75 140/80 102/81  Pulse: 79 80 77 85  Resp: 18 18 18  (!) 24  Temp: 98.3 F (36.8 C) 97.8 F (36.6 C) 99.3 F (37.4 C)   TempSrc: Axillary Oral Axillary   SpO2: 93% 100% 97% 100%  Weight:      Height:        Intake/Output Summary (Last 24 hours) at 05/16/2018 1331 Last data filed at 05/16/2018 0900 Gross per 24 hour  Intake 840 ml  Output 600 ml  Net 240 ml     Wt Readings from Last 3 Encounters:  05/14/18 72.6 kg  07/04/14 69.6 kg  06/14/14 67.6 kg    Physical Exam  General: Agitated and combative  Eyes:   HEENT:    Cardiovascular: S1 S2 clear. Regular rate and rhythm. No pedal edema b/l  Respiratory: Decreased breath sound at the bases with scattered rhonchi  Gastrointestinal: Soft, nontender, nondistended, + bowel sounds  Ext: no pedal edema bilaterally  Neuro: Difficult to assess, combative and agitated  Musculoskeletal: No digital cyanosis, clubbing  Skin:   Psych: Disoriented, combative    Data Reviewed:  I have personally reviewed following labs and imaging studies  Micro Results No results found for this or any previous visit (from the past 240 hour(s)).  Radiology Reports Ct Chest Wo Contrast  Result Date:  05/14/2018 CLINICAL DATA:  67 year old male with fall and trauma to the right chest wall. EXAM: CT CHEST WITHOUT CONTRAST TECHNIQUE: Multidetector CT imaging of the chest was performed following the standard protocol without IV contrast. COMPARISON:  Chest radiograph dated 07/02/2014 FINDINGS: Evaluation of this exam is limited in the absence of intravenous contrast. Cardiovascular: Top-normal cardiac size. No pericardial effusion. Multi vessel coronary vascular calcification and postsurgical changes of CABG. A 2.3 x 2.1 cm rounded structure in the anterior mediastinum anterior to the pulmonary trunk (series 3, image 70) is not characterized on this noncontrast CT but most likely represents a saccular aneurysm. This can  be further evaluated with contrast enhanced CT. There is mild atherosclerotic calcification of the thoracic aorta. The central pulmonary arteries are grossly unremarkable. A left subclavian stent is not well evaluated on this noncontrast CT. Mediastinum/Nodes: No hilar or mediastinal adenopathy. Small hiatal hernia. No mediastinal fluid collection. Lungs/Pleura: There is a background of emphysema. Minimal bibasilar atelectatic changes. There is a trace right pleural effusion. Minimal pneumothorax in the right apex measures 3 mm in thickness. No focal consolidation. Small amount of mucus material noted in the central airway. The airways are otherwise patent. Upper Abdomen: Cholecystectomy. The visualized upper abdomen is otherwise unremarkable. Musculoskeletal: Multiple mildly displaced right rib fractures involving the right 5th-11th ribs. There is a small amount of chest wall emphysema. Right scapular fixation screws. IMPRESSION: 1. Multiple mildly displaced right rib fractures involving the right 5th-11th ribs. 2. Minimal right apical pneumothorax. 3. Rounded structure in the anterior mediastinum, likely a saccular aneurysm. This can be further evaluated with contrast enhanced CT on a nonemergent  basis. These results were called by telephone at the time of interpretation on 05/14/2018 at 12:18 am to Dr. Addison Lank , who verbally acknowledged these results. Electronically Signed   By: Anner Crete M.D.   On: 05/14/2018 00:20   Dg Chest Port 1 View  Result Date: 05/14/2018 CLINICAL DATA:  67 year old male with right rib fractures. EXAM: PORTABLE CHEST 1 VIEW COMPARISON:  Chest CT dated 05/13/2018 FINDINGS: No visible pneumothorax. Right rib fractures with associated soft tissue emphysema of the right lateral chest wall. Bibasilar atelectasis. Cardiomegaly, median sternotomy wires, and CABG vascular clips. Left subclavian artery stent. IMPRESSION: 1. No visible pneumothorax. 2. Right rib fractures with associated soft tissue emphysema of the right lateral chest wall. Electronically Signed   By: Anner Crete M.D.   On: 05/14/2018 05:56   [place] Ekg Site Rite  Result Date: 05/15/2018 If Site Rite image not attached, placement could not be confirmed due to current cardiac rhythm.   Lab Data:  CBC: Recent Labs  Lab 05/14/18 0025 05/14/18 0626 05/15/18 0134 05/16/18 0445  WBC 13.5* 10.9* 13.2* 11.1*  NEUTROABS 11.4* 8.9*  --   --   HGB 14.9 14.0 13.9 13.4  HCT 42.6 40.1 39.6 38.8*  MCV 102.2* 99.3 100.8* 102.9*  PLT 203 239 224 878   Basic Metabolic Panel: Recent Labs  Lab 05/14/18 0626 05/14/18 1411 05/14/18 2037 05/15/18 0134 05/16/18 0445  NA 125* 125* 125* 128* 126*  K 3.7 4.1 4.0 4.0 3.9  CL 90* 90* 89* 92* 94*  CO2 23 24 24 23 26   GLUCOSE 99 108* 111* 103* 116*  BUN <5* 5* 5* 6* <5*  CREATININE 0.66 0.64 0.69 0.73 0.70  CALCIUM 7.9* 8.3* 8.7* 8.4* 8.0*   GFR: Estimated Creatinine Clearance: 94.5 mL/min (by C-G formula based on SCr of 0.7 mg/dL). Liver Function Tests: Recent Labs  Lab 05/14/18 0639  AST 34  ALT 32  ALKPHOS 57  BILITOT 1.4*  PROT 6.1*  ALBUMIN 3.0*   No results for input(s): LIPASE, AMYLASE in the last 168 hours. No results for  input(s): AMMONIA in the last 168 hours. Coagulation Profile: No results for input(s): INR, PROTIME in the last 168 hours. Cardiac Enzymes: Recent Labs  Lab 05/14/18 1411  CKTOTAL 218   BNP (last 3 results) No results for input(s): PROBNP in the last 8760 hours. HbA1C: No results for input(s): HGBA1C in the last 72 hours. CBG: No results for input(s): GLUCAP in the last 168 hours. Lipid  Profile: No results for input(s): CHOL, HDL, LDLCALC, TRIG, CHOLHDL, LDLDIRECT in the last 72 hours. Thyroid Function Tests: No results for input(s): TSH, T4TOTAL, FREET4, T3FREE, THYROIDAB in the last 72 hours. Anemia Panel: No results for input(s): VITAMINB12, FOLATE, FERRITIN, TIBC, IRON, RETICCTPCT in the last 72 hours. Urine analysis:    Component Value Date/Time   COLORURINE YELLOW 06/14/2014 2135   APPEARANCEUR CLEAR 06/14/2014 2135   LABSPEC 1.019 06/14/2014 2135   PHURINE 6.0 06/14/2014 2135   GLUCOSEU NEGATIVE 06/14/2014 2135   HGBUR NEGATIVE 06/14/2014 2135   BILIRUBINUR NEGATIVE 06/14/2014 2135   KETONESUR 40 (A) 06/14/2014 2135   PROTEINUR NEGATIVE 06/14/2014 2135   UROBILINOGEN 0.2 06/14/2014 2135   NITRITE NEGATIVE 06/14/2014 2135   LEUKOCYTESUR NEGATIVE 06/14/2014 2135     Declan Mier M.D. [facility] 05/16/2018, 1:31 PM  Pager: [phone] Between 7am to 7pm - call Pager - [phone]  After 7pm go to [email or web address] - password [email or web address]  Call night coverage person covering after 7pm

## 2018-05-16 NOTE — NC FL2 (Signed)
[place] LEVEL OF CARE SCREENING TOOL     IDENTIFICATION  Patient Name: [name] Pace Birthdate: 1951-05-29 Sex: male Admission Date (Current Location): 05/13/2018  [facility] [place] Number:  Herbalist and Address:  [facility], [place] [address], [place], [place] [address]      Provider Number: [id number]  Attending Physician Name and Address:  Mendel Corning, MD  Relative Name and Phone Number:       Current Level of Care: Hospital Recommended Level of Care: [place] Prior Approval Number:    Date Approved/Denied:   PASRR Number: [id number]  Discharge Plan: SNF    Current Diagnoses: Patient Active Problem List   Diagnosis Date Noted  . Multiple rib fractures 05/14/2018  . Pneumothorax, closed, traumatic, initial encounter 05/14/2018  . Traumatic fracture of ribs with pneumothorax   . Laceration of muscle(s) and tendon(s) of anterior muscle group at lower leg level, right leg, initial encounter 02/10/2016  . Traumatic rupture of right anterior tibial tendon 02/10/2016  . Hip fracture, left ([place]) 07/02/2014  . Closed left hip fracture ([place]) 07/02/2014  . Hip fracture ([place]) 07/02/2014  . Intertrochanteric fracture of left femur ([place]) 07/02/2014  . History of stroke   . Preop examination   . Subclavian artery stenosis, left ([place]) 06/15/2014  . Chest pain 06/14/2014  . CAP (community acquired pneumonia) 06/14/2014  . Hyponatremia 06/14/2014  . Sepsis ([place]) 06/14/2014  . Alcohol abuse 06/14/2014  . CAD (coronary artery disease) 06/14/2014  . Tobacco abuse 06/14/2014    Orientation RESPIRATION BLADDER Height & Weight     Self, Place  O2(nasal cannula 4L) Incontinent, External catheter Weight: 72.6 kg Height:  6\' 4"  (193 cm)  BEHAVIORAL SYMPTOMS/MOOD NEUROLOGICAL BOWEL NUTRITION STATUS      Continent Diet(please see DC summary)  AMBULATORY STATUS COMMUNICATION OF NEEDS Skin   Limited Assist  Verbally Normal                       Personal Care Assistance Level of Assistance  Bathing, Feeding, Dressing Bathing Assistance: Limited assistance Feeding assistance: Independent Dressing Assistance: Limited assistance     Functional Limitations Info  Sight, Hearing, Speech Sight Info: Adequate Hearing Info: Adequate Speech Info: Adequate    SPECIAL CARE FACTORS FREQUENCY  PT (By licensed PT), OT (By licensed OT)     PT Frequency: 5x/week OT Frequency: 5x/week            Contractures Contractures Info: Not present    Additional Factors Info  Code Status, Allergies Code Status Info: Full Allergies Info: Penicillins           Current Medications (05/16/2018):  This is the current hospital active medication list Current Facility-Administered Medications  Medication Dose Route Frequency Provider Last Rate Last Dose  . 0.9 %  sodium chloride infusion  250 mL Intravenous PRN Rai, Ripudeep K, MD      . 0.9 %  sodium chloride infusion   Intravenous Continuous Rai, Ripudeep K, MD 100 mL/hr at 05/15/18 2351    . acetaminophen (TYLENOL) tablet 1,000 mg  1,000 mg Oral Q6H Rai, Ripudeep K, MD   1,000 mg at 05/16/18 0604  . atorvastatin (LIPITOR) tablet 40 mg  40 mg Oral q1800 Rai, Ripudeep K, MD   40 mg at 05/15/18 1848  . dextromethorphan-guaiFENesin (MUCINEX DM) 30-600 MG per 12 hr tablet 1 tablet  1 tablet Oral BID Rai, Vernelle Emerald, MD  1 tablet at 05/16/18 1044  . diazepam (VALIUM) tablet 5 mg  5 mg Oral TID Rai, Ripudeep K, MD   5 mg at 05/16/18 1044  . docusate sodium (COLACE) capsule 100 mg  100 mg Oral BID Rai, Ripudeep K, MD   100 mg at 05/16/18 1044  . fentaNYL (SUBLIMAZE) injection 50-75 mcg  50-75 mcg Intravenous Q2H PRN Rai, Ripudeep K, MD   75 mcg at 05/16/18 1305  . folic acid (FOLVITE) tablet 1 mg  1 mg Oral Daily Rai, Ripudeep K, MD   1 mg at 05/16/18 1045  . gabapentin (NEURONTIN) capsule 300 mg  300 mg Oral TID Rai, Ripudeep K, MD   300 mg at 05/16/18  1044  . ipratropium-albuterol (DUONEB) 0.5-2.5 (3) MG/3ML nebulizer solution 3 mL  3 mL Nebulization Q6H PRN Rai, Ripudeep K, MD      . LORazepam (ATIVAN) injection 2-3 mg  2-3 mg Intravenous Q1H PRN Rai, Ripudeep K, MD   2 mg at 05/16/18 1615  . methocarbamol (ROBAXIN) tablet 750 mg  750 mg Oral TID Rai, Ripudeep K, MD   750 mg at 05/16/18 1044  . multivitamin with minerals tablet 1 tablet  1 tablet Oral Daily Rai, Ripudeep K, MD   1 tablet at 05/16/18 1044  . ondansetron (ZOFRAN) tablet 4 mg  4 mg Oral Q6H PRN Rai, Ripudeep K, MD       Or  . ondansetron (ZOFRAN) injection 4 mg  4 mg Intravenous Q6H PRN Rai, Ripudeep K, MD      . oxyCODONE (Oxy IR/ROXICODONE) immediate release tablet 5 mg  5 mg Oral Q4H PRN Rai, Ripudeep K, MD   5 mg at 05/16/18 0816  . polyethylene glycol (MIRALAX / GLYCOLAX) packet 17 g  17 g Oral Daily Rai, Ripudeep K, MD   17 g at 05/16/18 1044  . sodium chloride flush (NS) 0.9 % injection 10-40 mL  10-40 mL Intracatheter PRN Rai, Ripudeep K, MD      . sodium chloride flush (NS) 0.9 % injection 3 mL  3 mL Intravenous Q12H Rai, Ripudeep K, MD      . sodium chloride flush (NS) 0.9 % injection 3 mL  3 mL Intravenous Q12H Rai, Ripudeep K, MD      . sodium chloride flush (NS) 0.9 % injection 3 mL  3 mL Intravenous PRN Rai, Ripudeep K, MD      . spiritus frumenti (ethyl alcohol) solution 1 each  1 each Oral TID WC Rai, Ripudeep K, MD   1 each at 05/16/18 0816  . thiamine (VITAMIN B-1) tablet 100 mg  100 mg Oral Daily Rai, Ripudeep K, MD   100 mg at 05/16/18 1045   Or  . thiamine (B-1) injection 100 mg  100 mg Intravenous Daily Rai, Ripudeep K, MD         Discharge Medications: Please see discharge summary for a list of discharge medications.  Relevant Imaging Results:  Relevant Lab Results:   Additional Information SSN: [id number]  Estanislado Emms, LCSW

## 2018-05-16 NOTE — Progress Notes (Signed)
Attempted to notify patient's father ,[place] Kabir, that patient  has transferred to another unit. The number left on the chart is the wrong number.

## 2018-05-17 ENCOUNTER — Inpatient Hospital Stay (HOSPITAL_COMMUNITY): Payer: Medicare Other

## 2018-05-17 DIAGNOSIS — F10230 Alcohol dependence with withdrawal, uncomplicated: Secondary | ICD-10-CM

## 2018-05-17 LAB — URINALYSIS, ROUTINE W REFLEX MICROSCOPIC
Bilirubin Urine: NEGATIVE
Glucose, UA: NEGATIVE mg/dL
Hgb urine dipstick: NEGATIVE
Ketones, ur: 80 mg/dL — AB
Nitrite: POSITIVE — AB
PROTEIN: NEGATIVE mg/dL
Specific Gravity, Urine: 1.019 (ref 1.005–1.030)
pH: 6 (ref 5.0–8.0)

## 2018-05-17 LAB — BASIC METABOLIC PANEL
ANION GAP: 13 (ref 5–15)
Anion gap: 13 (ref 5–15)
BUN: <5 mg/dL — ABNORMAL LOW (ref 8–23)
BUN: 6 mg/dL — ABNORMAL LOW (ref 8–23)
CALCIUM: 8.3 mg/dL — AB (ref 8.9–10.3)
CHLORIDE: 96 mmol/L — AB (ref 98–111)
CO2: 21 mmol/L — ABNORMAL LOW (ref 22–32)
CO2: 22 mmol/L (ref 22–32)
Calcium: 8.4 mg/dL — ABNORMAL LOW (ref 8.9–10.3)
Chloride: 96 mmol/L — ABNORMAL LOW (ref 98–111)
Creatinine, Ser: 0.59 mg/dL — ABNORMAL LOW (ref 0.61–1.24)
Creatinine, Ser: 0.62 mg/dL (ref 0.61–1.24)
GFR calc Af Amer: >60 mL/min (ref >60)
GFR calc non Af Amer: >60 mL/min (ref >60)
GFR calc non Af Amer: >60 mL/min (ref >60)
Glucose, Bld: 95 mg/dL (ref 70–99)
Glucose, Bld: 124 mg/dL — ABNORMAL HIGH (ref 70–99)
Potassium: 3.6 mmol/L (ref 3.5–5.1)
Potassium: 3.6 mmol/L (ref 3.5–5.1)
SODIUM: 130 mmol/L — AB (ref 135–145)
Sodium: 131 mmol/L — ABNORMAL LOW (ref 135–145)

## 2018-05-17 LAB — CBC
HCT: 40.2 % (ref 39.0–52.0)
Hemoglobin: 13.7 g/dL (ref 13.0–17.0)
MCH: 34.6 pg — ABNORMAL HIGH (ref 26.0–34.0)
MCHC: 34.1 g/dL (ref 30.0–36.0)
MCV: 101.5 fL — AB (ref 80.0–100.0)
Platelets: 275 10*3/uL (ref 150–400)
RBC: 3.96 MIL/uL — ABNORMAL LOW (ref 4.22–5.81)
RDW: 11.6 % (ref 11.5–15.5)
WBC: 10.3 10*3/uL (ref 4.0–10.5)
nRBC: 0 % (ref 0.0–0.2)

## 2018-05-17 MED ORDER — ASPIRIN 300 MG RE SUPP
300.0000 mg | Freq: Every day | RECTAL | Status: DC
  Administered 2018-05-18: 300 mg via RECTAL
  Filled 2018-05-17: qty 1

## 2018-05-17 MED ORDER — IPRATROPIUM-ALBUTEROL 0.5-2.5 (3) MG/3ML IN SOLN
3.0000 mL | Freq: Three times a day (TID) | RESPIRATORY_TRACT | Status: DC
  Administered 2018-05-18 – 2018-05-23 (×15): 3 mL via RESPIRATORY_TRACT
  Filled 2018-05-17 (×16): qty 3

## 2018-05-17 MED ORDER — LEVOFLOXACIN IN D5W 500 MG/100ML IV SOLN
500.0000 mg | INTRAVENOUS | Status: DC
  Administered 2018-05-17: 500 mg via INTRAVENOUS
  Filled 2018-05-17: qty 100

## 2018-05-17 MED ORDER — ACETAMINOPHEN 650 MG RE SUPP
650.0000 mg | Freq: Four times a day (QID) | RECTAL | Status: DC | PRN
  Administered 2018-05-17: 650 mg via RECTAL
  Filled 2018-05-17: qty 1

## 2018-05-17 MED ORDER — IPRATROPIUM-ALBUTEROL 0.5-2.5 (3) MG/3ML IN SOLN
3.0000 mL | Freq: Three times a day (TID) | RESPIRATORY_TRACT | Status: DC
  Administered 2018-05-17: 3 mL via RESPIRATORY_TRACT

## 2018-05-17 MED ORDER — DEXTROSE-NACL 5-0.9 % IV SOLN
INTRAVENOUS | Status: DC
  Administered 2018-05-17 – 2018-05-21 (×8): via INTRAVENOUS

## 2018-05-17 NOTE — Progress Notes (Signed)
PROGRESS NOTE    [name] Pace  TIR:[id number] DOB: 03-15-1951 DOA: 05/13/2018 PCP: System, Provider Not In   Brief Narrative:  67 year old male with history of alcohol abuse, CAD, history of CVA presented to ED with severe right-sided chest wall pain after a fall on the night of 12/7.In ED afebrile, O2 sats low 80s on room air, sodium 123, bicarb 19, glucose 110, leukocytosis with WBCs 13.5, macrocytosis. CT chest showed multiple mildly displaced right rib fractures involving the right 5th-11th ribs, minimal right apical pneumothorax and rounded structure in the anterior mediastinum likely reflecting a saccular aneurysm.Patient was admitted for further work-up. Drinks 6 pack of beer most nights.  Subjective:   Patient has required 10 mg ativan IV since 5 am this morning. Has required restraints and sitter. On 2lits [place] and saturating well but appears to have upper airway secretions and somewhat tachypneic. Seen by trauma who feel Pneumothorax is stable. He spilled Beer with meals and spit out oral valium.   Objective: Vitals:   05/17/18 1159 05/17/18 1230 05/17/18 1242 05/17/18 1416  BP: 102/72  99/81 99/81  Pulse: (!) 101 99 (!) 101 (!) 104  Resp:      Temp: 100.1 F (37.8 C)     TempSrc: Axillary     SpO2: 98%     Weight:      Height:        Intake/Output Summary (Last 24 hours) at 05/17/2018 1614 Last data filed at 05/17/2018 1425 Gross per 24 hour  Intake -  Output 650 ml  Net -650 ml   Filed Weights   05/13/18 2325 05/14/18 0302 05/17/18 0400  Weight: 72.6 kg 72.6 kg 73.9 kg    Physical Examination:  General exam: Appears sedated , tachypneic Respiratory system: Upper airway secretions on auscultation. Increased Respiratory effort. Cardiovascular system: S1 & S2 heard, RRR. No JVD, murmurs, rubs,   Gastrointestinal system: Abdomen is nondistended, soft and nontender. No organomegaly or masses felt. Normal bowel sounds heard. Central nervous system: Disoriented,  sedated, moving all extremities, No focal neurological deficits. Extremities: restraints in UE Skin: No rashes, lesions or ulcers Psychiatry: Agitated intermittently, in withdrawal    Data Reviewed: I have personally reviewed following labs and imaging studies  CBC: Recent Labs  Lab 05/14/18 0025 05/14/18 0626 05/15/18 0134 05/16/18 0445 05/17/18 0338  WBC 13.5* 10.9* 13.2* 11.1* 10.3  NEUTROABS 11.4* 8.9*  --   --   --   HGB 14.9 14.0 13.9 13.4 13.7  HCT 42.6 40.1 39.6 38.8* 40.2  MCV 102.2* 99.3 100.8* 102.9* 101.5*  PLT 203 239 224 228 676   Basic Metabolic Panel: Recent Labs  Lab 05/14/18 1411 05/14/18 2037 05/15/18 0134 05/16/18 0445 05/17/18 0338  NA 125* 125* 128* 126* 130*  K 4.1 4.0 4.0 3.9 3.6  CL 90* 89* 92* 94* 96*  CO2 24 24 23 26  21*  GLUCOSE 108* 111* 103* 116* 95  BUN 5* 5* 6* <5* <5*  CREATININE 0.64 0.69 0.73 0.70 0.59*  CALCIUM 8.3* 8.7* 8.4* 8.0* 8.4*   GFR: Estimated Creatinine Clearance: 96.2 mL/min (A) (by C-G formula based on SCr of 0.59 mg/dL (L)). Liver Function Tests: Recent Labs  Lab 05/14/18 0639  AST 34  ALT 32  ALKPHOS 57  BILITOT 1.4*  PROT 6.1*  ALBUMIN 3.0*   No results for input(s): LIPASE, AMYLASE in the last 168 hours. No results for input(s): AMMONIA in the last 168 hours. Coagulation Profile: No results for input(s): INR, PROTIME in  the last 168 hours. Cardiac Enzymes: Recent Labs  Lab 05/14/18 1411  CKTOTAL 218   BNP (last 3 results) No results for input(s): PROBNP in the last 8760 hours. HbA1C: No results for input(s): HGBA1C in the last 72 hours. CBG: No results for input(s): GLUCAP in the last 168 hours. Lipid Profile: No results for input(s): CHOL, HDL, LDLCALC, TRIG, CHOLHDL, LDLDIRECT in the last 72 hours. Thyroid Function Tests: No results for input(s): TSH, T4TOTAL, FREET4, T3FREE, THYROIDAB in the last 72 hours. Anemia Panel: No results for input(s): VITAMINB12, FOLATE, FERRITIN, TIBC, IRON,  RETICCTPCT in the last 72 hours. Sepsis Labs: No results for input(s): PROCALCITON, LATICACIDVEN in the last 168 hours.  No results found for this or any previous visit (from the past 240 hour(s)).       Radiology Studies: No results found.      Scheduled Meds: . acetaminophen  1,000 mg Oral Q6H  . dextromethorphan-guaiFENesin  1 tablet Oral BID  . diazepam  5 mg Oral TID  . gabapentin  300 mg Oral TID  . polyethylene glycol  17 g Oral Daily  . sodium chloride flush  3 mL Intravenous Q12H  . sodium chloride flush  3 mL Intravenous Q12H  . spiritus frumenti  1 each Oral TID WC  . thiamine  100 mg Oral Daily   Or  . thiamine  100 mg Intravenous Daily   Continuous Infusions: . sodium chloride    . dextrose 5 % and 0.9% NaCl      Assessment & Plan:    1. Alcohol withdrawal: On scheduled Valium but not tolerating po intake. Continue Ativan prn. NPO with IV fluids given aspiration risk. Can attempt NG tube if tolerates for po meds ( valium /librium or neurontin) Landscape architect. Restraints prn. May need ICU transfer if condition worsens  2. Acute hypoxic resp failure: secondary to above vs rib fractures/pneumothorax vs aspiration. Titrate o2 as needed. Neb rx, suction prn. Unable to tolerate spirometry. Repeat CXR  3.  Hyponatremia: Beer potomania likely as no improvement with hydration. Check uric acid for possible SIADH. Can try fluid restriction if able to eat or NG feeds, can be off IV fluids. Serum or urine electrolytes/osmolarity likely would be unreliable at this point as been on IV fluids.   4. Acute encephalopathy: secondary to #1 vs sedatives vs Wernicke's. Continue IV thiamine , change fluids to dextrose/NS. BMP to watch sodium.   5.Incidental saccular aneurysm :CT incidentally showed rounded structure in the anterior mediastinum likely reflecting a saccular aneurysm.Nonemergent contrast CT recommended, outpatient follow-up with CT surgery  6. H/O CVA/CAD: asa  rectal and  Statins if able to take PO      DVT prophylaxis: lovenox Code Status: Full  Disposition Plan: TBD     LOS: 3 days    Time spent: 45 minutes. D/W bedside nurse in detail    Guilford Shi, MD [facility] Pager [phone]  If 7PM-7AM, please contact night-coverage [email or web address] Password [facility] 05/17/2018, 4:14 PM

## 2018-05-17 NOTE — Progress Notes (Signed)
Attempted to give pt food and drink. Pt spit food back out and refuses drink at this time. Pt confused about situation and disoriented to time and place. States he is at [employer]" when asked if he knows his location. Continued soft limb restraints.

## 2018-05-17 NOTE — Care Management Important Message (Signed)
Important Message  Patient Details  Name: [name] Pace MRN: [id number] Date of Birth: Aug 16, 1950   Medicare Important Message Given:  Yes Due to illness patient unable to sign. Unsigned copy left at bedside.   [name] 05/17/2018, 2:57 PM

## 2018-05-17 NOTE — Plan of Care (Signed)
  Problem: Education: Goal: Knowledge of General Education information will improve Description Including pain rating scale, medication(s)/side effects and non-pharmacologic comfort measures Outcome: Not Progressing   Problem: Clinical Measurements: Goal: Ability to maintain clinical measurements within normal limits will improve Outcome: Not Progressing Goal: Will remain free from infection Outcome: Not Progressing Goal: Diagnostic test results will improve Outcome: Not Progressing

## 2018-05-17 NOTE — Progress Notes (Signed)
PT Cancellation Note  Patient Details Name: RAYSHON ALBAUGH MRN: [id number] DOB: Sep 16, 1950   Cancelled Treatment:    Reason Eval/Treat Not Completed: Patient not medically ready. RN requesting PT hold off treatment today secondary to pt's current cognitive status. Will check back tomorrow.  Mabeline Caras, PT, DPT Acute Rehabilitation Services  Pager [phone] Office [place] 05/17/2018, 1:43 PM

## 2018-05-17 NOTE — Progress Notes (Signed)
    Value:  Radiology CXR resulted  Date & Time Notied: 05/17/2018 2030  Provider Notified:Schorr, NP  Orders Received/Actions taken: Awaiting orders. NPO status maintained.

## 2018-05-17 NOTE — Progress Notes (Signed)
  Subjective: Confused mumbling  Objective: Vital signs in last 24 hours: Temp:  [99.1 F (37.3 C)-100.1 F (37.8 C)] 100.1 F (37.8 C) (12/12 0802) Pulse Rate:  [84-110] 101 (12/12 0802) Resp:  [20-24] 20 (12/12 0400) BP: (90-169)/(67-99) 108/87 (12/12 0802) SpO2:  [95 %-100 %] 95 % (12/12 0802) Weight:  [73.9 kg] 73.9 kg (12/12 0400) Last BM Date: (Pt is unsure of last BM)  Intake/Output from previous day: 12/11 0701 - 12/12 0700 In: 240 [P.O.:240] Out: 200 [Urine:200] Intake/Output this shift: No intake/output data recorded.  General appearance: no distress Resp: clear to auscultation bilaterally Chest wall: right sided chest wall tenderness Cardio: regular rate and rhythm GI: soft, NT  Lab Results: CBC  Recent Labs    05/16/18 0445 05/17/18 0338  WBC 11.1* 10.3  HGB 13.4 13.7  HCT 38.8* 40.2  PLT 228 275   BMET Recent Labs    05/16/18 0445 05/17/18 0338  NA 126* 130*  K 3.9 3.6  CL 94* 96*  CO2 26 21*  GLUCOSE 116* 95  BUN <5* <5*  CREATININE 0.70 0.59*  CALCIUM 8.0* 8.4*   PT/INR No results for input(s): LABPROT, INR in the last 72 hours. ABG No results for input(s): PHART, HCO3 in the last 72 hours.  Invalid input(s): PCO2, PO2  Studies/Results: No results found.  Anti-infectives: Anti-infectives (From admission, onward)   None      Assessment/Plan: R rib fxs 5-11 with apical PNX,subcutaneous emphysema - f/u CXR stable without pneumothorax - Pulmonary toilet, incentive spirometer - multimodal pain control: scheduled Tylenol, Gabapentin, Robaxin with PRN fentanyl and oxycodone - PT/OT  Acute alcohol withdrawal - beer - CIWA per primary    LOS: 3 days    Georganna Skeans, MD, MPH, FACS Trauma: [phone] General Surgery: [phone]  12/12/2019Patient ID: [name] Pace, male   DOB: Apr 21, 1951, 67 y.o.   MRN: [id number]

## 2018-05-18 ENCOUNTER — Inpatient Hospital Stay (HOSPITAL_COMMUNITY): Payer: Medicare Other

## 2018-05-18 DIAGNOSIS — J9600 Acute respiratory failure, unspecified whether with hypoxia or hypercapnia: Secondary | ICD-10-CM

## 2018-05-18 DIAGNOSIS — F10231 Alcohol dependence with withdrawal delirium: Secondary | ICD-10-CM

## 2018-05-18 DIAGNOSIS — R579 Shock, unspecified: Secondary | ICD-10-CM

## 2018-05-18 DIAGNOSIS — F1010 Alcohol abuse, uncomplicated: Secondary | ICD-10-CM

## 2018-05-18 DIAGNOSIS — S2241XA Multiple fractures of ribs, right side, initial encounter for closed fracture: Principal | ICD-10-CM

## 2018-05-18 DIAGNOSIS — E440 Moderate protein-calorie malnutrition: Secondary | ICD-10-CM

## 2018-05-18 DIAGNOSIS — J9601 Acute respiratory failure with hypoxia: Secondary | ICD-10-CM

## 2018-05-18 DIAGNOSIS — F10931 Alcohol use, unspecified with withdrawal delirium: Secondary | ICD-10-CM | POA: Clinically undetermined

## 2018-05-18 DIAGNOSIS — E871 Hypo-osmolality and hyponatremia: Secondary | ICD-10-CM

## 2018-05-18 LAB — CBC
HCT: 44.1 % (ref 39.0–52.0)
Hemoglobin: 15.1 g/dL (ref 13.0–17.0)
MCH: 35.5 pg — ABNORMAL HIGH (ref 26.0–34.0)
MCHC: 34.2 g/dL (ref 30.0–36.0)
MCV: 103.8 fL — ABNORMAL HIGH (ref 80.0–100.0)
NRBC: 0 % (ref 0.0–0.2)
Platelets: 286 10*3/uL (ref 150–400)
RBC: 4.25 MIL/uL (ref 4.22–5.81)
RDW: 11.9 % (ref 11.5–15.5)
WBC: 11 10*3/uL — AB (ref 4.0–10.5)

## 2018-05-18 LAB — BLOOD GAS, ARTERIAL
Acid-Base Excess: 1.4 mmol/L (ref 0.0–2.0)
Bicarbonate: 25 mmol/L (ref 20.0–28.0)
O2 Content: 8 L/min
O2 Saturation: 97.1 %
Patient temperature: 98.6
pCO2 arterial: 36.3 mmHg (ref 32.0–48.0)
pH, Arterial: 7.452 — ABNORMAL HIGH (ref 7.350–7.450)
pO2, Arterial: 87.9 mmHg (ref 83.0–108.0)

## 2018-05-18 LAB — GLUCOSE, CAPILLARY
Glucose-Capillary: 120 mg/dL — ABNORMAL HIGH (ref 70–99)
Glucose-Capillary: 142 mg/dL — ABNORMAL HIGH (ref 70–99)
Glucose-Capillary: 145 mg/dL — ABNORMAL HIGH (ref 70–99)

## 2018-05-18 LAB — POCT I-STAT 3, ART BLOOD GAS (G3+)
Acid-base deficit: 3 mmol/L — ABNORMAL HIGH (ref 0.0–2.0)
Bicarbonate: 21.2 mmol/L (ref 20.0–28.0)
O2 Saturation: 100 %
Patient temperature: 98.7
TCO2: 22 mmol/L (ref 22–32)
pCO2 arterial: 35.5 mmHg (ref 32.0–48.0)
pH, Arterial: 7.384 (ref 7.350–7.450)
pO2, Arterial: 266 mmHg — ABNORMAL HIGH (ref 83.0–108.0)

## 2018-05-18 LAB — BASIC METABOLIC PANEL
Anion gap: 14 (ref 5–15)
BUN: 6 mg/dL — ABNORMAL LOW (ref 8–23)
CO2: 20 mmol/L — ABNORMAL LOW (ref 22–32)
Calcium: 8.4 mg/dL — ABNORMAL LOW (ref 8.9–10.3)
Chloride: 101 mmol/L (ref 98–111)
Creatinine, Ser: 0.51 mg/dL — ABNORMAL LOW (ref 0.61–1.24)
GFR calc Af Amer: >60 mL/min (ref >60)
GLUCOSE: 145 mg/dL — AB (ref 70–99)
Potassium: 4.2 mmol/L (ref 3.5–5.1)
Sodium: 135 mmol/L (ref 135–145)

## 2018-05-18 LAB — SURGICAL PCR SCREEN
MRSA, PCR: NEGATIVE
STAPHYLOCOCCUS AUREUS: NEGATIVE

## 2018-05-18 MED ORDER — LEVOFLOXACIN IN D5W 750 MG/150ML IV SOLN
750.0000 mg | INTRAVENOUS | Status: AC
  Administered 2018-05-18 – 2018-05-22 (×5): 750 mg via INTRAVENOUS
  Filled 2018-05-18 (×5): qty 150

## 2018-05-18 MED ORDER — DEXMEDETOMIDINE HCL IN NACL 200 MCG/50ML IV SOLN
0.0000 ug/kg/h | INTRAVENOUS | Status: DC
  Administered 2018-05-18: 0.5 ug/kg/h via INTRAVENOUS
  Filled 2018-05-18: qty 50

## 2018-05-18 MED ORDER — ETOMIDATE 2 MG/ML IV SOLN
25.0000 mg | Freq: Once | INTRAVENOUS | Status: AC
  Administered 2018-05-18: 25 mg via INTRAVENOUS
  Filled 2018-05-18: qty 12.5

## 2018-05-18 MED ORDER — VITAL AF 1.2 CAL PO LIQD
1000.0000 mL | ORAL | Status: DC
  Administered 2018-05-18 – 2018-05-23 (×7): 1000 mL

## 2018-05-18 MED ORDER — NOREPINEPHRINE 16 MG/250ML-% IV SOLN
0.0000 ug/min | INTRAVENOUS | Status: DC
  Administered 2018-05-18 – 2018-05-20 (×2): 10 ug/min via INTRAVENOUS
  Filled 2018-05-18 (×2): qty 250

## 2018-05-18 MED ORDER — VITAL HIGH PROTEIN PO LIQD: 1000.0000 mL | ORAL | Status: DC

## 2018-05-18 MED ORDER — FENTANYL CITRATE (PF) 100 MCG/2ML IJ SOLN
50.0000 ug | INTRAMUSCULAR | Status: DC | PRN
  Administered 2018-05-18 – 2018-05-19 (×2): 50 ug via INTRAVENOUS
  Filled 2018-05-18 (×2): qty 2

## 2018-05-18 MED ORDER — FENTANYL CITRATE (PF) 100 MCG/2ML IJ SOLN
50.0000 ug | INTRAMUSCULAR | Status: AC | PRN
  Administered 2018-05-18 (×3): 50 ug via INTRAVENOUS

## 2018-05-18 MED ORDER — METRONIDAZOLE IN NACL 5-0.79 MG/ML-% IV SOLN
500.0000 mg | Freq: Three times a day (TID) | INTRAVENOUS | Status: DC
  Administered 2018-05-18: 500 mg via INTRAVENOUS
  Filled 2018-05-18: qty 100

## 2018-05-18 MED ORDER — SODIUM CHLORIDE 0.9% FLUSH
10.0000 mL | Freq: Two times a day (BID) | INTRAVENOUS | Status: DC
  Administered 2018-05-19 – 2018-05-21 (×5): 10 mL
  Administered 2018-05-21: 30 mL
  Administered 2018-05-22 – 2018-05-23 (×3): 10 mL
  Administered 2018-05-24: 20 mL
  Administered 2018-05-24 – 2018-06-01 (×12): 10 mL

## 2018-05-18 MED ORDER — SODIUM CHLORIDE 0.9 % IV BOLUS
1000.0000 mL | Freq: Once | INTRAVENOUS | Status: AC
  Administered 2018-05-18: 1000 mL via INTRAVENOUS

## 2018-05-18 MED ORDER — SODIUM CHLORIDE 0.9% FLUSH
10.0000 mL | INTRAVENOUS | Status: DC | PRN
  Administered 2018-06-02: 10 mL
  Filled 2018-05-18: qty 40

## 2018-05-18 MED ORDER — MORPHINE SULFATE (PF) 2 MG/ML IV SOLN
2.0000 mg | Freq: Once | INTRAVENOUS | Status: AC
  Administered 2018-05-18: 2 mg via INTRAVENOUS
  Filled 2018-05-18: qty 1

## 2018-05-18 MED ORDER — FENTANYL CITRATE (PF) 100 MCG/2ML IJ SOLN
INTRAMUSCULAR | Status: AC
  Administered 2018-05-18: 50 ug via INTRAVENOUS
  Filled 2018-05-18: qty 2

## 2018-05-18 MED ORDER — NOREPINEPHRINE BITARTRATE 1 MG/ML IV SOLN: 0.0000 ug/min | INTRAVENOUS | Status: DC

## 2018-05-18 MED ORDER — NOREPINEPHRINE 4 MG/250ML-% IV SOLN
0.0000 ug/min | INTRAVENOUS | Status: DC
  Administered 2018-05-18: 5 ug/min via INTRAVENOUS
  Filled 2018-05-18 (×4): qty 250

## 2018-05-18 MED ORDER — MIDAZOLAM HCL 2 MG/2ML IJ SOLN
2.0000 mg | Freq: Once | INTRAMUSCULAR | Status: AC
  Administered 2018-05-18: 2 mg via INTRAVENOUS

## 2018-05-18 MED ORDER — DEXMEDETOMIDINE HCL IN NACL 200 MCG/50ML IV SOLN: 0.4000 ug/kg/h | INTRAVENOUS | Status: DC

## 2018-05-18 MED ORDER — FAMOTIDINE 40 MG/5ML PO SUSR
20.0000 mg | Freq: Two times a day (BID) | ORAL | Status: DC
  Administered 2018-05-18 – 2018-05-23 (×11): 20 mg
  Filled 2018-05-18 (×11): qty 2.5

## 2018-05-18 MED ORDER — ORAL CARE MOUTH RINSE
15.0000 mL | OROMUCOSAL | Status: DC
  Administered 2018-05-18 – 2018-05-23 (×45): 15 mL via OROMUCOSAL

## 2018-05-18 MED ORDER — MIDAZOLAM HCL 2 MG/2ML IJ SOLN
INTRAMUSCULAR | Status: AC
  Administered 2018-05-18: 2 mg via INTRAVENOUS
  Filled 2018-05-18: qty 2

## 2018-05-18 MED ORDER — CHLORHEXIDINE GLUCONATE 0.12% ORAL RINSE (MEDLINE KIT)
15.0000 mL | Freq: Two times a day (BID) | OROMUCOSAL | Status: DC
  Administered 2018-05-18 – 2018-06-01 (×23): 15 mL via OROMUCOSAL

## 2018-05-18 MED ORDER — GABAPENTIN 250 MG/5ML PO SOLN
300.0000 mg | Freq: Three times a day (TID) | ORAL | Status: DC
  Administered 2018-05-18 – 2018-05-23 (×14): 300 mg via ORAL
  Filled 2018-05-18 (×15): qty 6

## 2018-05-18 MED ORDER — LIDOCAINE HCL (PF) 1 % IJ SOLN
INTRAMUSCULAR | Status: AC
  Administered 2018-05-18: 10:00:00
  Filled 2018-05-18: qty 5

## 2018-05-18 MED ORDER — SUCCINYLCHOLINE CHLORIDE 20 MG/ML IJ SOLN
75.0000 mg | Freq: Once | INTRAMUSCULAR | Status: AC
  Administered 2018-05-18: 75 mg via INTRAVENOUS
  Filled 2018-05-18: qty 3.75

## 2018-05-18 MED ORDER — CHLORHEXIDINE GLUCONATE CLOTH 2 % EX PADS
6.0000 | MEDICATED_PAD | Freq: Every day | CUTANEOUS | Status: DC
  Administered 2018-05-20 – 2018-06-01 (×12): 6 via TOPICAL

## 2018-05-18 MED ORDER — DEXMEDETOMIDINE HCL IN NACL 400 MCG/100ML IV SOLN
0.0000 ug/kg/h | INTRAVENOUS | Status: DC
  Administered 2018-05-18: 0.8 ug/kg/h via INTRAVENOUS
  Administered 2018-05-18: 0.5 ug/kg/h via INTRAVENOUS
  Administered 2018-05-19: 1 ug/kg/h via INTRAVENOUS
  Administered 2018-05-19: 0.6 ug/kg/h via INTRAVENOUS
  Filled 2018-05-18 (×4): qty 100

## 2018-05-18 NOTE — Progress Notes (Addendum)
eLink Physician-Brief Progress Note Patient Name: [name] Pace DOB: May 11, 1951 MRN: [id number]   Date of Service  05/18/2018  HPI/Events of Note  Notified of increased work of breathing and increased oxygen requirement.  Also persistently febrile. Patient with history of fall sustaining rib fractures and a pneumothorax that was being monitored.  eICU Interventions  CXR ordered to evaluate for possible extension of pneumothorax or development of pneumonia given high risk for aspiration. Continued on ativan for ETOH withdrawal.  ABG ordered.  CXR with increasing right sided infiltrate, no extension of pneumothorax. Ordered blood and respiratory cultures. Increased levofloxacin to 750 and add metronidazole for aspiration pneumonia/anaerobic coverage.  Patient has anaphylaxis to penicillin on chart. Updated NP Chaney Malling. Discussed case with Dr Carson Myrtle.     Intervention Category Major Interventions: Respiratory failure - evaluation and management  [name] 05/18/2018, 4:08 AM

## 2018-05-18 NOTE — Procedures (Signed)
Arterial Catheter Insertion Procedure Note [name] Pace [id number] 1951/03/29  Procedure: Insertion of Arterial Catheter  Indications: Blood pressure monitoring and Frequent blood sampling  Procedure Details Consent: Unable to obtain consent because of emergent medical necessity. Time Out: Verified patient identification, verified procedure, site/side was marked, verified correct patient position, special equipment/implants available, medications/allergies/relevent history reviewed, required imaging and test results available.  Performed  Maximum sterile technique was used including antiseptics, cap, gloves, gown, hand hygiene, mask and sheet. Skin prep: Chlorhexidine; local anesthetic administered 20 gauge catheter was inserted into right radial artery using the Seldinger technique. ULTRASOUND GUIDANCE USED: NO Evaluation Blood flow good; BP tracing good. Complications: No apparent complications.   [name] Pace 05/18/2018

## 2018-05-18 NOTE — Progress Notes (Signed)
Follow up: Notified by RN regarding results of CXR ordered earlier this afternoon by attending MD. Findings c/w possible enlarging (15-20%) pneumothorax at (R) lung base vs a pattern of atelectasis simulating a basilar pneumothorax. Discussed pt w/ Dr Genevive Bi w/ Warren Lacy who also reviewed previous and current CXR's. She relates today's CXR not convincing for pneumothorax and recommends repeat CXR in am. Pt currently w/o c/o and w/ 02 sats of 94-96% ON 2L [place]. Will place order for repeat CXR in AM. RN ask to notify this NP for any change in  pt's respiratory status. Will continue to monitor closely on SDU.  Jeryl [place], NP-C [facility] Pager [phone]

## 2018-05-18 NOTE — Progress Notes (Signed)
Initial Nutrition Assessment  DOCUMENTATION CODES:   Non-severe (moderate) malnutrition in context of social or environmental circumstances  INTERVENTION:   Tube Feeding:  Vital AF 1.2 @ 70 ml/hr Provides 2016 kcals, 126 g of protein and 1361 mL of free water Meets 100% estimated calorie and protein needs   NUTRITION DIAGNOSIS:   Moderate Malnutrition related to social / environmental circumstances(EtOH and tobacco abuse) as evidenced by mild fat depletion, mild muscle depletion.   GOAL:   Patient will meet greater than or equal to 90% of their needs  MONITOR:   TF tolerance, Vent status, Labs, Weight trends  REASON FOR ASSESSMENT:   Ventilator, Consult Enteral/tube feeding initiation and management  ASSESSMENT:   67 yo male admitted with right-sided rib fractures s/p fall, also wit small pneumothorax on 12/9. On 12/13, pt with decline requiring intubation. PMH includes CAD, stroke, EtOH and tobacco abuse, hyponatremia  Patient is currently intubated on ventilator support MV: 10.6 L/min Temp (24hrs), Avg:99.3 F (37.4 C), Min:98.1 F (36.7 C), Max:100.8 F (38.2 C)  No family at bedside; unable to obtain diet and weight history. Physical exam complete. Pt appears to not be taking good care of himself on exam.  Labs: reviewed Meds: thiamine, D5-NS at 100 ml/hr, precedex, levophed  NUTRITION - FOCUSED PHYSICAL EXAM:    Most Recent Value  Orbital Region  Mild depletion  Upper Arm Region  Mild depletion  Thoracic and Lumbar Region  Mild depletion  Buccal Region  Unable to assess  Temple Region  Mild depletion  Clavicle Bone Region  Moderate depletion  Clavicle and Acromion Bone Region  Moderate depletion  Scapular Bone Region  Moderate depletion  Dorsal Hand  Mild depletion  Patellar Region  Moderate depletion  Anterior Thigh Region  Moderate depletion  Posterior Calf Region  Mild depletion  Edema (RD Assessment)  Mild       Diet Order:   Diet Order             Diet NPO time specified  Diet effective now              EDUCATION NEEDS:   Not appropriate for education at this time  Skin:  Skin Assessment: Skin Integrity Issues: Skin Integrity Issues:: Other (Comment) Other: burn marks on feet  Last BM:  no documented BM  Height:   Ht Readings from Last 1 Encounters:  05/18/18 6\' 2"  (1.88 m)    Weight:   Wt Readings from Last 1 Encounters:  05/18/18 73.3 kg    Ideal Body Weight:     BMI:  Body mass index is 20.75 kg/m.  Estimated Nutritional Needs:   Kcal:  2022 kcals   Protein:  110-145 g  Fluid:  >/= 2 L    Kerman Passey MS, RD, LDN, CNSC [phone] Pager  [phone] Weekend/On-Call Pager

## 2018-05-18 NOTE — Progress Notes (Signed)
On-call, rapid response RN, and e-link MD notified of decline in patient's respiratory status and increased work of breathing.  STAT ABG and CXR obtained.  New orders received for blood cultures and IV abx.  Pt. CIWA score continues to remain between 8-16.  Q1hr CIWA assessments continued.  Will continue to monitor patient status closely.

## 2018-05-18 NOTE — Significant Event (Signed)
Rapid Response Event Note  Overview: Respiratory Distress  Initial Focused Assessment: I was called by RN about patient's ongoing respiratory distress. I was currently with multiple patient with medical emergencies so I was not able to see this patient right away. Per RN, patient has been increased work of breathing and increased tachypnea which has been ongoing. Per RN and NT (sitter), patient has been agitated on and off, on CIWA protocol, CIWA scores in the teens and have treated. Lung sounds - coarse crackles, lung sounds more diminished on the RT side but diminished overall. Patient's RR in the upper 30 - mid 40s, shallow and rapid, + diaphoretic, skin clammy. VS: HR in the 100-110s, saturations greater than 93% on 8L HFNC (was on 2L oxygen when shift started), has been febrile tonight (treated with APAP).   RN had obtained an order for CXR. Per RN it was schedule for 0700. I called the RT and asked RT to assess patient and to let know what they find. I arrived around 0400. Upon arrival, RNs had utilized Warren Lacy, MD assessed the patient.   Interventions: - Levaquin dose increased - CIWA scored and treated.  Plan of Care: - Will monitor patient for now, I asked the RNs to keep RRT updated with patient's condition. High Risk for Aspiration and Intubation. - I called at 635 for an update, per RN, CCM MD was at the bedside, ordered Morphine IV and Flagyl, plan to watch patient, no need for intubation at this time - I received a call at 658 about patient's saturations dropping into the 70s. Upon arrival, patient was profusely diaphoretic, HR in the upper 120s, guppy breathing, and mottled skin. ELINK was called, CCM MD arrived at bedside. Patient was NTS by MD, decision made to intubate patient on 6E. Patient was successully intubated at 0729 8.0 ETT, good color change, given Etomidate 25 mg, Succ 75 mg, and Versed 2 mg. Post Intubation became hypotensive, given 1L NS and 2nd L NS bolus was started.  SBP improved to the 80s and patient was transferred to [place].  Event Summary:    at    Call Time 0300 Arrival Time 0400 End Time 0800   [name] Pace R

## 2018-05-18 NOTE — Consult Note (Addendum)
Initial Pulmonary/Critical Care Consultation  Patient Name: [name] Pace MRN: [id number] DOB: 20-Jul-1950    ADMISSION DATE:  05/13/2018 CONSULTATION DATE:  05/18/2018  REFERRING MD:  Dr. Guilford Shi  REASON FOR CONSULTATION:  Tachypnea   HISTORY OF PRESENT ILLNESS  This 67 y.o. Caucasian male smoker is seen in consultation at the request of Dr. Guilford Shi for recommendations on further evaluation and management of tachypnea and respiratory distress.  The patient was initially admitted on 05/14/2018 after sustaining a fall and complaining of severe right chest pain.  Emergency department evaluation revealed that the patient had sustained multiple right-sided rib fractures.  He also had a small pneumothorax, which did not require tube thoracostomy. We are called to see the patient due to concerns from the nurse that the patient is exhibiting tachypnea. The patient started the night shift on 2 LPM via nasal cannula with an SpO2 92%. Empirically, the patient has been provided with supplemental oxygen via [place] cannula at 8 LPM with modest improvement in SpO2 to 95%; however, it is noted that the patient maintains an SpO2 90-92% when turned back down to 2 LPM. He has continued to score high (20s) on the CIWA protocol. He is awake, agitated, anxious and uncooperative, albeit in a non-violent manner. He denies pain except when palpating his right chest.  Reports that the patient was febrile earlier in the evening, for which patient was given Tylenol.  Review of documentation reveals that the ER physician also broaden his antibiotic coverage to include metronidazole (penicillin allergy).  Chest x-ray has been obtained in the interim.  No worsening of the pneumothorax was identified.  In fact, it appears that the pneumothorax may have resolved, although a subpulmonic pneumothorax cannot be ruled out.  Platelike atelectatic changes are also noted.  ABG was obtained: 7.45/36/88.  REVIEW OF  SYSTEMS This patient is critically ill and cannot provide additional history nor review of systems due to altered mental status.   PAST MEDICAL/SURGICAL/SOCIAL/FAMILY HISTORIES   Past Medical History:  Diagnosis Date  . Alcohol abuse   . Anxiety   . Breast cancer ([place]) Left breast   Per patient diagnosed in early 59's.   . Coronary artery disease   . High cholesterol   . Prostate cancer ([place])    per patient diagnosed in early 23's  . Stroke [facility])     Past Surgical History:  Procedure Laterality Date  . CAROTID STENT    . CORONARY ARTERY BYPASS GRAFT    . FEMUR IM NAIL Left 07/02/2014   Procedure: INTRAMEDULLARY (IM) NAIL FEMORAL;  Surgeon: Elie Goody, MD;  Location: [facility];  Service: Orthopedics;  Laterality: Left;  . I&D EXTREMITY Right 02/10/2016   Procedure: IRRIGATION AND DEBRIDEMENT EXTREMITY;  Surgeon: Dorna Leitz, MD;  Location: [place];  Service: Orthopedics;  Laterality: Right;  . TENDON REPAIR Right 02/10/2016   Procedure: Anterior TENDON REPAIR and wound exploration.;  Surgeon: Dorna Leitz, MD;  Location: [place];  Service: Orthopedics;  Laterality: Right;    Social History   Tobacco Use  . Smoking status: Current Every Day Smoker    Packs/day: 1.00    Years: 42.00    Pack years: 42.00    Types: Cigarettes  . Smokeless tobacco: Never Used  Substance Use Topics  . Alcohol use: Yes    Comment: 4-5 beers a day    Family History  Problem Relation Age of Onset  . Dementia Mother     Allergies  Allergen Reactions  .  Penicillins Anaphylaxis    throat    Prior to Admission medications   Medication Sig Start Date End Date Taking? Authorizing Provider  aspirin EC 325 MG EC tablet Take 1 tablet (325 mg total) by mouth 2 (two) times daily with a meal. Patient taking differently: Take 81-325 mg by mouth daily.  07/04/14  Yes Debbe Odea, MD    Current Facility-Administered Medications  Medication Dose Route Frequency Provider Last Rate Last Dose  . 0.9  %  sodium chloride infusion  250 mL Intravenous PRN Rai, Ripudeep K, MD      . acetaminophen (TYLENOL) suppository 650 mg  650 mg Rectal Q6H PRN Schorr, Rhetta Mura, NP   650 mg at 05/17/18 2024  . acetaminophen (TYLENOL) tablet 1,000 mg  1,000 mg Oral Q6H Rai, Ripudeep K, MD   1,000 mg at 05/16/18 0604  . aspirin suppository 300 mg  300 mg Rectal Daily Kamineni, Neelima, MD      . dextrose 5 %-0.9 % sodium chloride infusion   Intravenous Continuous Guilford Shi, MD 100 mL/hr at 05/18/18 0019    . diazepam (VALIUM) tablet 5 mg  5 mg Oral TID Mendel Corning, MD   Stopped at 05/17/18 2015  . fentaNYL (SUBLIMAZE) injection 50-75 mcg  50-75 mcg Intravenous Q2H PRN Rai, Ripudeep K, MD   75 mcg at 05/16/18 1305  . gabapentin (NEURONTIN) capsule 300 mg  300 mg Oral TID Mendel Corning, MD   Stopped at 05/17/18 2016  . ipratropium-albuterol (DUONEB) 0.5-2.5 (3) MG/3ML nebulizer solution 3 mL  3 mL Nebulization Q6H PRN Rai, Ripudeep K, MD      . ipratropium-albuterol (DUONEB) 0.5-2.5 (3) MG/3ML nebulizer solution 3 mL  3 mL Nebulization TID Guilford Shi, MD      . levofloxacin (LEVAQUIN) IVPB 750 mg  750 mg Intravenous Q24H Eliezer Bottom T, MD      . LORazepam (ATIVAN) injection 2-3 mg  2-3 mg Intravenous Q1H PRN Rai, Ripudeep K, MD   2 mg at 05/18/18 0436  . metroNIDAZOLE (FLAGYL) IVPB 500 mg  500 mg Intravenous Q8H [place], Raquel Sarna T, MD      . morphine 2 MG/ML injection 2 mg  2 mg Intravenous Once Renee Pain, MD      . ondansetron [facility]) tablet 4 mg  4 mg Oral Q6H PRN Rai, Ripudeep K, MD       Or  . ondansetron (ZOFRAN) injection 4 mg  4 mg Intravenous Q6H PRN Rai, Ripudeep K, MD      . oxyCODONE (Oxy IR/ROXICODONE) immediate release tablet 5 mg  5 mg Oral Q4H PRN Rai, Ripudeep K, MD   5 mg at 05/16/18 0816  . polyethylene glycol (MIRALAX / GLYCOLAX) packet 17 g  17 g Oral Daily Rai, Ripudeep K, MD   17 g at 05/16/18 1044  . sodium chloride flush (NS) 0.9 % injection 10-40 mL  10-40  mL Intracatheter PRN Rai, Ripudeep K, MD      . sodium chloride flush (NS) 0.9 % injection 3 mL  3 mL Intravenous Q12H Rai, Ripudeep K, MD      . sodium chloride flush (NS) 0.9 % injection 3 mL  3 mL Intravenous Q12H Rai, Ripudeep K, MD   3 mL at 05/16/18 2143  . sodium chloride flush (NS) 0.9 % injection 3 mL  3 mL Intravenous PRN Rai, Ripudeep K, MD      . thiamine (VITAMIN B-1) tablet 100 mg  100 mg Oral Daily Rai,  Ripudeep K, MD   100 mg at 05/16/18 1045   Or  . thiamine (B-1) injection 100 mg  100 mg Intravenous Daily Rai, Ripudeep K, MD   100 mg at 05/17/18 1849    VITAL SIGNS: BP (!) 108/94 (BP Location: Left Arm)   Pulse (!) 101   Temp 98.5 F (36.9 C) (Oral)   Resp (!) 35   Ht 6\' 4"  (1.93 m)   Wt 73.9 kg   SpO2 94%   BMI 19.84 kg/m   INTAKE / OUTPUT: I/O last 3 completed shifts: In: 240 [P.O.:240] Out: 850 [Urine:850]  PHYSICAL EXAMINATION: GENERAL: alert, anxious, agitated, confused or uncooperative. No acute distress. HEAD: normocephalic, atraumatic EYE: PERRLA, EOM intact, no scleral icterus, no pallor. NOSE: nares are patent. No polyps. No exudate. No sinus tenderness. THROAT/ORAL CAVITY: Poor dentition. No oral thrush.  Dry mucous membranes.  Mallampati class I (soft palate, uvula, fauces, and tonsillar pillars visible) airway. NECK: supple, no thyromegaly, no JVD, no lymphadenopathy. Trachea midline. CHEST/LUNG: symmetric in development and expansion. Good air entry. No crackles. No wheezes. HEART: Regular S1 and S2 without murmur, rub or gallop. ABDOMEN: soft, nontender, nondistended. Normoactive bowel sounds. No rebound. No guarding. No hepatosplenomegaly. EXTREMITIES: Edema: none. No cyanosis.  No clubbing. 2+ DP pulses LYMPHATIC: no cervical/axillary/inguinal lymph nodes appreciated MUSCULOSKELETAL: No point tenderness.  No bulk atrophy. Joints: Normal.  SKIN: Ecchymosis involving entire R hemithorax. No rash or lesion. NEUROLOGIC: Cranial nerves II-XII are  grossly symmetric and physiologic. Babinski absent. No sensory deficit. Motor: 5/5 @ RUE, 5/5 @ LUE, 5/5 @ RLL,  5/5 @ LLL.  DTR: 2+ @ R biceps, 2+ @ L biceps, 2+ @ R patellar,  2+ @ L patellar. No cerebellar signs. Gait was not assessed.   LABS:  ABG Recent Labs  Lab 05/18/18 0413  PHART 7.452*  PCO2ART 36.3  PO2ART 40.8   BASIC METABOLIC PROFILE Recent Labs  Lab 05/16/18 0445 05/17/18 0338 05/17/18 2022  NA 126* 130* 131*  K 3.9 3.6 3.6  CL 94* 96* 96*  CO2 26 21* 22  BUN <5* <5* 6*  CREATININE 0.70 0.59* 0.62  GLUCOSE 116* 95 124*  CALCIUM 8.0* 8.4* 8.3*   Liver Enzymes Recent Labs  Lab 05/14/18 0639  AST 34  ALT 32  ALKPHOS 57  BILITOT 1.4*  ALBUMIN 3.0*   CBC Recent Labs  Lab 05/15/18 0134 05/16/18 0445 05/17/18 0338  WBC 13.2* 11.1* 10.3  HGB 13.9 13.4 13.7  HCT 39.6 38.8* 40.2  PLT 224 228 275   CULTURES: Results for orders placed or performed during the hospital encounter of 02/10/16  Surgical PCR screen     Status: None   Collection Time: 02/10/16  3:38 PM  Result Value Ref Range Status   MRSA, PCR NEGATIVE NEGATIVE Final   Staphylococcus aureus NEGATIVE NEGATIVE Final    Comment:        The Xpert SA Assay (FDA approved for NASAL specimens in patients over 49 years of age), is one component of a comprehensive surveillance program.  Test performance has been validated by [facility] for patients greater than or equal to 47 year old. It is not intended to diagnose infection nor to guide or monitor treatment.     IMAGING: Dg Chest Port 1 View  Result Date: 05/18/2018 CLINICAL DATA:  67 y/o  M; increased shortness of breath. EXAM: PORTABLE CHEST 1 VIEW COMPARISON:  05/17/2018 chest radiograph. FINDINGS: Multiple displaced right lateral rib fractures are stable. Subcutaneous  emphysema within the right chest wall again noted. Linear structure at the right lung base is stable possibly representing subpulmonic pneumothorax or atelectasis as  described on prior study. Stable bibasilar atelectasis. Stable right effusion. Stable cardiac silhouette given projection and technique. Post median sternotomy and CABG with wires in alignment. Stable break in the sternotomy wire second from bottom. Postsurgical changes of the right shoulder joint. IMPRESSION: 1. Stable linear structure at the right lung base possibly representing subpulmonic pneumothorax or atelectasis as described on prior study. 2. Stable displaced right lateral rib fractures and subcutaneous emphysema of the right chest wall. 3. Stable bibasilar atelectasis and right-sided effusion. Electronically Signed   By: Kristine Garbe M.D.   On: 05/18/2018 05:35   Dg Chest Port 1 View  Result Date: 05/17/2018 CLINICAL DATA:  Severe right-sided chest pain after falling on 05/12/2018 EXAM: PORTABLE CHEST 1 VIEW COMPARISON:  05/14/2018.  05/13/2018. FINDINGS: Artifact overlies the chest. Previous median sternotomy and CABG with cardiomegaly. Increased density of the left lung base medially most consistent with atelectasis and or pneumonia. On the right, I think there is a pneumothorax loculated at the right base. This appears to have enlarged. There is worsened atelectasis at the right base. Subcutaneous air veins evident on the right. Multiple right-sided rib fractures as seen previously IMPRESSION: Newly seen lucency of the right lung base that looks like an enlarging basilar pneumothorax. This is no more than about 15-20%. Alternatively, this could relate to a pattern of atelectasis, merely simulating a basilar pneumothorax. Worsened atelectasis at both lung bases. Persistent and increased subcutaneous emphysema on the right. These results will be called to the ordering clinician or representative by the Radiologist Assistant, and communication documented in the PACS or zVision Dashboard. Electronically Signed   By: Nelson Chimes M.D.   On: 05/17/2018 20:06     ANTIBIOTICS: Levofloxacin/metronidazole (12/13>>  SIGNIFICANT EVENTS: 12/9: Presented to ER after a fall with complaints of severe right-sided chest pain.  Multiple right sided rib fractures (5-11), tiny right pneumothorax.  Started on CIWA protocol for alcohol withdrawal.   ASSESSMENT / PLAN: Principal Problem:   Multiple rib fractures Active Problems:   Delirium tremens (HCC)   Hyponatremia   Alcohol abuse   CAD (coronary artery disease)   History of stroke   Pneumothorax, closed, traumatic, initial encounter   NEUROLOGIC  Alcohol withdrawal  Delirium  Anxiety disorder, NOS Morphine 2 mg single dose.  Consider morphine in lieu of fentanyl. Continue CIWA protocol. It appears the patient also has a history of anxiety disorder and is noted to be on Valium 5 mg PO TID in addition to his Ativan.  PULMONARY  Tachypnea  High risk for aspiration At this point, the clinical findings suggest inadequate control of his alcohol withdrawal symptoms, possibly complicated by pain/discomfort of multiple rib fractures.  INFECTIOUS  Fever Agree with Levaquin for UTI and broadened empiric coverage for possible aspiration Blood and urine cultures.   CARDIOVASCULAR  Tachycardia Check 12-lead EKG   RENAL  Hyponatremia, probably beer-drinker's potomania, slowly improving Defer management to primary team   GASTROINTESTINAL  GI prophylaxis: famotidine  HEMATOLOGIC  DVT prophylaxis: SCDs  ENDOCRINE  No acute issues    My assessment, plan of care, findings, medications, side effects, etc. were discussed with: nurse.  FAMILY  - Updates: no family available at bedside   Renee Pain, MD Board Certified by the ABIM, [place] Pager: [phone]  05/18/2018, 6:24 AM  Critical care time: 45 minutes.  The treatment and management of the patient's condition was required based on the threat of imminent  deterioration. This time reflects time spent by the physician evaluating, providing care and managing the critically ill patient's care. The time was spent at the immediate bedside (or on the same floor/unit and dedicated to this patient's care). Time involved in separately billable procedures is NOT included int he critical care time indicated above. Family meeting and update time may be included above if and only if the patient is unable/incompetent to participate in clinical interview and/or decision making, and the discussion was necessary to determining treatment decisions.  Renee Pain, MD Board Certified by the ABIM, [place]

## 2018-05-18 NOTE — Progress Notes (Signed)
Called to bs to assess pt's worsening resp status.  Pt is on 3 lpm [place], Spo2 92-93%, RR 43, HR 113.  Pt is restrained and very agitated.  Coarse crackles noted throughout lung fields.  Pt placed on [place] @ 8lpm.  Spo2 96%.  Pt has calmed somewhat.  CXR ordered.  Will cont to monitor pt.

## 2018-05-18 NOTE — Progress Notes (Signed)
Responded to PIV consult. MD present and states  CVAD will be placed. Consult cleared.

## 2018-05-18 NOTE — Progress Notes (Signed)
Patient ID: [name] Pace, male   DOB: [birth year], 67 y.o.   MRN: [id number] Events noted. I D/W the CCM team. Trauma will sign off at this point. Please recall PRN.  Georganna Skeans, MD, MPH, FACS Trauma: [phone] General Surgery: [phone]

## 2018-05-18 NOTE — Progress Notes (Signed)
OVERNIGHT COVERAGE CRITICAL CARE PROGRESS NOTE  CTSP re: hypoxia. The patient has been advanced to 80% NRB and has SpO2 88-90%. Obtunded mental status. NT suctioning reveals copious secretions both above and below the cords. SpO2 did promptly improve to 95% but continues to be somnolent.  Intubate. Transfer to ICU. Levophed gtt for blood pressure support. NS bolus.  Renee Pain, MD Board Certified by the ABIM, [place]

## 2018-05-18 NOTE — Procedures (Addendum)
Central Venous Catheter Insertion Procedure Note [name] Pace [id number] [birth year]  Procedure: Insertion of Central Venous Catheter Indications: Drug and/or fluid administration  Procedure Details Consent: Unable to obtain consent because of altered level of consciousness. Time Out: Verified patient identification, verified procedure, site/side was marked, verified correct patient position, special equipment/implants available, medications/allergies/relevent history reviewed, required imaging and test results available.  Performed  Maximum sterile technique was used including antiseptics, cap, gloves, gown, hand hygiene, mask and sheet. Skin prep: Chlorhexidine; local anesthetic administered A antimicrobial bonded/coated triple lumen catheter was placed in the right internal jugular vein using the Seldinger technique.  Evaluation Blood flow good Complications: No apparent complications Patient did tolerate procedure well. Chest X-ray ordered to verify placement.  CXR: pending.  [name] Pace 05/18/2018, 9:58 AM  I was present, scrubbed and supervised the entire procedure.  CXR with TLC in a good position.  Rush Farmer, M.D. [facility] Pulmonary/Critical Care Medicine. Pager: [phone]. After hours pager: [phone].

## 2018-05-18 NOTE — Consult Note (Signed)
Initial Pulmonary/Critical Care Consultation  Patient Name: [name] Pace MRN: [id number] DOB: 04-12-1951    ADMISSION DATE:  05/13/2018 CONSULTATION DATE:  05/18/2018  REFERRING MD:  Dr. Guilford Shi  REASON FOR CONSULTATION:  Tachypnea   HISTORY OF PRESENT ILLNESS  This 67 y.o. Caucasian male smoker is seen in consultation at the request of Dr. Guilford Shi for recommendations on further evaluation and management of tachypnea and respiratory distress.  The patient was initially admitted on 05/14/2018 after sustaining a fall and complaining of severe right chest pain.  Emergency department evaluation revealed that the patient had sustained multiple right-sided rib fractures.  He also had a small pneumothorax, which did not require tube thoracostomy. We are called to see the patient due to concerns from the nurse that the patient is exhibiting tachypnea. The patient started the night shift on 2 LPM via nasal cannula with an SpO2 92%. Empirically, the patient has been provided with supplemental oxygen via [place] cannula at 8 LPM with modest improvement in SpO2 to 95%; however, it is noted that the patient maintains an SpO2 90-92% when turned back down to 2 LPM. He has continued to score high (20s) on the CIWA protocol. He is awake, agitated, anxious and uncooperative, albeit in a non-violent manner. He denies pain except when palpating his right chest.  Reports that the patient was febrile earlier in the evening, for which patient was given Tylenol.  Review of documentation reveals that the ER physician also broaden his antibiotic coverage to include metronidazole (penicillin allergy).  Chest x-ray has been obtained in the interim.  No worsening of the pneumothorax was identified.  In fact, it appears that the pneumothorax may have resolved, although a subpulmonic pneumothorax cannot be ruled out.  Platelike atelectatic changes are also noted.  ABG was obtained: 7.45/36/88.  Subjective: No  events since intubation, sedate and synchronous with the vent  VITAL SIGNS: BP 91/71 (BP Location: Left Arm)   Pulse 68   Temp 98.7 F (37.1 C) (Oral)   Resp 16   Ht 6\' 2"  (1.88 m)   Wt 73.3 kg   SpO2 98%   BMI 20.75 kg/m   INTAKE / OUTPUT: I/O last 3 completed shifts: In: 367 [I.V.:267; IV Piggyback:100] Out: 850 [Urine:850]  PHYSICAL EXAMINATION: General: Acute on chronically appearing male, NAD, sedate HEENT: [place]/AT, PERRL, EOM-I and MMM Heart: RRR, Nl S1/S2 and -M/R/G Lung: Coarse BS diffusely R>L Abdomen: Soft, NT, ND and +BS Ext: -edema and -tenderness Neuro: Sedate, unresponsive but moving all ext to pain not command Skin: Thin but intact  LABS:  ABG Recent Labs  Lab 05/18/18 0413 05/18/18 0914  PHART 7.452* 7.384  PCO2ART 36.3 35.5  PO2ART 87.9 948.5*   BASIC METABOLIC PROFILE Recent Labs  Lab 05/17/18 0338 05/17/18 2022 05/18/18 0721  NA 130* 131* 135  K 3.6 3.6 4.2  CL 96* 96* 101  CO2 21* 22 20*  BUN <5* 6* 6*  CREATININE 0.59* 0.62 0.51*  GLUCOSE 95 124* 145*  CALCIUM 8.4* 8.3* 8.4*   Liver Enzymes Recent Labs  Lab 05/14/18 0639  AST 34  ALT 32  ALKPHOS 57  BILITOT 1.4*  ALBUMIN 3.0*   CBC Recent Labs  Lab 05/16/18 0445 05/17/18 0338 05/18/18 0721  WBC 11.1* 10.3 11.0*  HGB 13.4 13.7 15.1  HCT 38.8* 40.2 44.1  PLT 228 275 286   CULTURES: Results for orders placed or performed during the hospital encounter of 02/10/16  Surgical PCR screen  Status: None   Collection Time: 02/10/16  3:38 PM  Result Value Ref Range Status   MRSA, PCR NEGATIVE NEGATIVE Final   Staphylococcus aureus NEGATIVE NEGATIVE Final    Comment:        The Xpert SA Assay (FDA approved for NASAL specimens in patients over 58 years of age), is one component of a comprehensive surveillance program.  Test performance has been validated by [facility] for patients greater than or equal to 61 year old. It is not intended to diagnose infection nor  to guide or monitor treatment.     IMAGING: Dg Chest Port 1 View  Result Date: 05/18/2018 CLINICAL DATA:  ET tube adjustment EXAM: PORTABLE CHEST 1 VIEW COMPARISON:  May 18, 2018 FINDINGS: An ET tube terminates in the mid trachea, in good position. An enteric tube terminates below today's film. No pneumothorax. Air in the right chest wall remains, stable. Stable cardiomegaly. The hila and mediastinum are unchanged. Layering effusion on the right with underlying opacity, similar. Mild patchy opacity in left base, slightly more prominent. No other interval changes. IMPRESSION: 1. Support apparatus as above. 2. Stable effusion and underlying opacity in the right base. 3. Mild opacity in the left base is slightly more prominent in the interval. Recommend attention on follow-up. Electronically Signed   By: Dorise Bullion III M.D   On: 05/18/2018 09:05   Dg Chest Port 1 View  Result Date: 05/18/2018 CLINICAL DATA:  Respiratory difficulty EXAM: PORTABLE CHEST 1 VIEW COMPARISON:  05/18/2018 418 hours FINDINGS: NG tube has been placed. The tip is in the antrum of the stomach. Endotracheal tube is not clearly visualized. Normal heart size. Low lung volumes. Bibasilar opacity likely combination of atelectasis and airspace disease is stable. No evidence of pneumothorax. Right-sided rib fractures are again noted. IMPRESSION: NG tube placed into the stomach. Endotracheal tube is not visualized. Stable bibasilar pulmonary opacities as described No sign of pneumothorax. Multiple right-sided rib fractures are unchanged. Electronically Signed   By: Marybelle Killings M.D.   On: 05/18/2018 08:24   Dg Chest Port 1 View  Result Date: 05/18/2018 CLINICAL DATA:  67 y/o  M; increased shortness of breath. EXAM: PORTABLE CHEST 1 VIEW COMPARISON:  05/17/2018 chest radiograph. FINDINGS: Multiple displaced right lateral rib fractures are stable. Subcutaneous emphysema within the right chest wall again noted. Linear structure  at the right lung base is stable possibly representing subpulmonic pneumothorax or atelectasis as described on prior study. Stable bibasilar atelectasis. Stable right effusion. Stable cardiac silhouette given projection and technique. Post median sternotomy and CABG with wires in alignment. Stable break in the sternotomy wire second from bottom. Postsurgical changes of the right shoulder joint. IMPRESSION: 1. Stable linear structure at the right lung base possibly representing subpulmonic pneumothorax or atelectasis as described on prior study. 2. Stable displaced right lateral rib fractures and subcutaneous emphysema of the right chest wall. 3. Stable bibasilar atelectasis and right-sided effusion. Electronically Signed   By: Kristine Garbe M.D.   On: 05/18/2018 05:35   Dg Chest Port 1 View  Result Date: 05/17/2018 CLINICAL DATA:  Severe right-sided chest pain after falling on 05/12/2018 EXAM: PORTABLE CHEST 1 VIEW COMPARISON:  05/14/2018.  05/13/2018. FINDINGS: Artifact overlies the chest. Previous median sternotomy and CABG with cardiomegaly. Increased density of the left lung base medially most consistent with atelectasis and or pneumonia. On the right, I think there is a pneumothorax loculated at the right base. This appears to have enlarged. There is worsened atelectasis  at the right base. Subcutaneous air veins evident on the right. Multiple right-sided rib fractures as seen previously IMPRESSION: Newly seen lucency of the right lung base that looks like an enlarging basilar pneumothorax. This is no more than about 15-20%. Alternatively, this could relate to a pattern of atelectasis, merely simulating a basilar pneumothorax. Worsened atelectasis at both lung bases. Persistent and increased subcutaneous emphysema on the right. These results will be called to the ordering clinician or representative by the Radiologist Assistant, and communication documented in the PACS or zVision Dashboard.  Electronically Signed   By: Nelson Chimes M.D.   On: 05/17/2018 20:06    ANTIBIOTICS: Levofloxacin/metronidazole (12/13>>  SIGNIFICANT EVENTS: 12/9: Presented to ER after a fall with complaints of severe right-sided chest pain.  Multiple right sided rib fractures (5-11), tiny right pneumothorax.  Started on CIWA protocol for alcohol withdrawal.  ASSESSMENT / PLAN: Principal Problem:   Multiple rib fractures Active Problems:   Hyponatremia   Alcohol abuse   CAD (coronary artery disease)   History of stroke   Pneumothorax, closed, traumatic, initial encounter   Delirium tremens ([place])   NEUROLOGIC  Alcohol withdrawal  Delirium  Anxiety disorder, NOS - CIWA protocol - Precedex - Benzos - Thiamine/folate - MVI  PULMONARY  Tachypnea  High risk for aspiration  PTX - Full vent support - Titrate o2 fors at of 88-92% - Adjust vent for ABG - Treat infection as below - Hold off weaning at this point - I reviewed the CXR, it is difficult to ascertain a PTX, will perform chest CT without contrast to delineate that information  INFECTIOUS  Fever - Levaquin for UTI and concern for aspiration PNA - D/C flagyl, no need to cover anaerobes in aspiration PNA - F/u on blood cultures - Monitor for further signs of aspiration   CARDIOVASCULAR  Tachycardia - EKG - Tele monitoring   RENAL  Hyponatremia, probably beer-drinker's potomania, slowly improving - IVF resuscitation - BMET in AM - Replace electrolytes as indicated   GASTROINTESTINAL  GI prophylaxis: famotidine - TF per nturition  HEMATOLOGIC  DVT prophylaxis: SCDs  ENDOCRINE  No acute issues    The patient is critically ill with multiple organ systems failure and requires high complexity decision making for assessment and support, frequent evaluation and titration of therapies, application of advanced monitoring technologies and extensive interpretation of multiple databases.   Critical Care Time  devoted to patient care services described in this note is  60  Minutes. This time reflects time of care of this signee Dr Jennet Maduro. This critical care time does not reflect procedure time, or teaching time or supervisory time of PA/NP/Med student/Med Resident etc but could involve care discussion time.  Rush Farmer, M.D. [facility] Pulmonary/Critical Care Medicine. Pager: [phone]. After hours pager: [phone].

## 2018-05-18 NOTE — Plan of Care (Signed)
  Problem: Clinical Measurements: Goal: Ability to maintain clinical measurements within normal limits will improve Outcome: Progressing Goal: Respiratory complications will improve Outcome: Progressing Goal: Cardiovascular complication will be avoided Outcome: Progressing   Problem: Nutrition: Goal: Adequate nutrition will be maintained Outcome: Progressing   Problem: Elimination: Goal: Will not experience complications related to urinary retention Outcome: Progressing   Problem: Safety: Goal: Ability to remain free from injury will improve Outcome: Progressing

## 2018-05-18 NOTE — Procedures (Signed)
Intubation Procedure Note [name] Pace [id number] September 13, 1950  Procedure: Intubation Indications: Airway protection and maintenance  Procedure Details Consent: Unable to obtain consent because of altered level of consciousness. Time Out: Verified patient identification, verified procedure, site/side was marked, verified correct patient position, special equipment/implants available, medications/allergies/relevent history reviewed, required imaging and test results available.  Performed  Maximum sterile technique was used including gloves and hand hygiene.  GlideScope was used. 8.0 ETT was advanced to 24 cm at the lip.   Evaluation Hemodynamic Status: Transient hypotension treated with pressors; O2 sats: stable throughout Patient's Current Condition: stable Complications: No apparent complications Patient did tolerate procedure well. Chest X-ray ordered to verify placement.  CXR: pending.   Renee Pain 05/18/2018

## 2018-05-18 NOTE — Significant Event (Signed)
Accepted patient into 2H20 with Rapid Response from [place]. Patient arrived already intubated. Arrived with bilateral wrist restraints; does not follow commands, woke up agitated. ETT adjusted by RRT Jeneen Rinks. Bruises noted all over body, predominately on right chest down to hip; scattered abrasions on right foot and other parts of body. Only personal belongings are clothes in a hospital bag. No family at bedside. RN called patient's father whose phone number is listed in the chart; no answered; not able to leave voice message. A-line and CVC placed at bedside.

## 2018-05-19 ENCOUNTER — Inpatient Hospital Stay (HOSPITAL_COMMUNITY): Payer: Medicare Other

## 2018-05-19 LAB — CBC
HEMATOCRIT: 33 % — AB (ref 39.0–52.0)
Hemoglobin: 11.4 g/dL — ABNORMAL LOW (ref 13.0–17.0)
MCH: 35.4 pg — ABNORMAL HIGH (ref 26.0–34.0)
MCHC: 34.5 g/dL (ref 30.0–36.0)
MCV: 102.5 fL — ABNORMAL HIGH (ref 80.0–100.0)
Platelets: 265 10*3/uL (ref 150–400)
RBC: 3.22 MIL/uL — ABNORMAL LOW (ref 4.22–5.81)
RDW: 12.1 % (ref 11.5–15.5)
WBC: 8.1 10*3/uL (ref 4.0–10.5)
nRBC: 0 % (ref 0.0–0.2)

## 2018-05-19 LAB — PHOSPHORUS: Phosphorus: 2.5 mg/dL (ref 2.5–4.6)

## 2018-05-19 LAB — BASIC METABOLIC PANEL
Anion gap: 7 (ref 5–15)
BUN: 10 mg/dL (ref 8–23)
CALCIUM: 7.4 mg/dL — AB (ref 8.9–10.3)
CO2: 20 mmol/L — ABNORMAL LOW (ref 22–32)
Chloride: 110 mmol/L (ref 98–111)
Creatinine, Ser: 0.55 mg/dL — ABNORMAL LOW (ref 0.61–1.24)
GFR calc Af Amer: >60 mL/min (ref >60)
GFR calc non Af Amer: >60 mL/min (ref >60)
Glucose, Bld: 136 mg/dL — ABNORMAL HIGH (ref 70–99)
Potassium: 3.2 mmol/L — ABNORMAL LOW (ref 3.5–5.1)
Sodium: 137 mmol/L (ref 135–145)

## 2018-05-19 LAB — GLUCOSE, CAPILLARY
GLUCOSE-CAPILLARY: 133 mg/dL — AB (ref 70–99)
Glucose-Capillary: 117 mg/dL — ABNORMAL HIGH (ref 70–99)
Glucose-Capillary: 143 mg/dL — ABNORMAL HIGH (ref 70–99)
Glucose-Capillary: 152 mg/dL — ABNORMAL HIGH (ref 70–99)
Glucose-Capillary: 106 mg/dL — ABNORMAL HIGH (ref 70–99)

## 2018-05-19 LAB — POCT I-STAT 3, ART BLOOD GAS (G3+)
Acid-base deficit: 2 mmol/L (ref 0.0–2.0)
Bicarbonate: 20.7 mmol/L (ref 20.0–28.0)
O2 Saturation: 99 %
PCO2 ART: 28.4 mmHg — AB (ref 32.0–48.0)
PH ART: 7.47 — AB (ref 7.350–7.450)
Patient temperature: 98.2
TCO2: 22 mmol/L (ref 22–32)
pO2, Arterial: 111 mmHg — ABNORMAL HIGH (ref 83.0–108.0)

## 2018-05-19 LAB — MAGNESIUM: Magnesium: 1.7 mg/dL (ref 1.7–2.4)

## 2018-05-19 MED ORDER — POTASSIUM CHLORIDE 20 MEQ PO PACK
40.0000 meq | PACK | Freq: Two times a day (BID) | ORAL | Status: AC
  Administered 2018-05-19 – 2018-05-20 (×3): 40 meq via ORAL
  Filled 2018-05-19 (×3): qty 2

## 2018-05-19 MED ORDER — FOLIC ACID 1 MG PO TABS
1.0000 mg | ORAL_TABLET | Freq: Every day | ORAL | Status: DC
  Administered 2018-05-19 – 2018-05-23 (×5): 1 mg
  Filled 2018-05-19 (×5): qty 1

## 2018-05-19 MED ORDER — LIDOCAINE 5 % EX PTCH
2.0000 | MEDICATED_PATCH | CUTANEOUS | Status: DC
  Administered 2018-05-19 – 2018-06-04 (×17): 2 via TRANSDERMAL
  Administered 2018-06-05: 1 via TRANSDERMAL
  Administered 2018-06-08 – 2018-06-09 (×2): 2 via TRANSDERMAL
  Filled 2018-05-19 (×23): qty 2

## 2018-05-19 MED ORDER — ASPIRIN 325 MG PO TABS
325.0000 mg | ORAL_TABLET | Freq: Every day | ORAL | Status: DC
  Administered 2018-05-19 – 2018-05-23 (×5): 325 mg
  Filled 2018-05-19 (×5): qty 1

## 2018-05-19 MED ORDER — FENTANYL 2500MCG IN NS 250ML (10MCG/ML) PREMIX INFUSION
0.0000 ug/h | INTRAVENOUS | Status: DC
  Administered 2018-05-19: 100 ug/h via INTRAVENOUS
  Administered 2018-05-20: 400 ug/h via INTRAVENOUS
  Administered 2018-05-20: 300 ug/h via INTRAVENOUS
  Filled 2018-05-19 (×3): qty 250

## 2018-05-19 MED ORDER — SODIUM CHLORIDE 0.9 % IV SOLN
INTRAVENOUS | Status: DC
  Administered 2018-05-19 – 2018-05-20 (×4): via INTRAVENOUS

## 2018-05-19 MED ORDER — KETOROLAC TROMETHAMINE 30 MG/ML IJ SOLN
30.0000 mg | Freq: Three times a day (TID) | INTRAMUSCULAR | Status: AC
  Administered 2018-05-19 – 2018-05-20 (×3): 30 mg via INTRAVENOUS
  Filled 2018-05-19 (×3): qty 1

## 2018-05-19 MED ORDER — SODIUM CHLORIDE 0.9 % IV BOLUS
1000.0000 mL | Freq: Once | INTRAVENOUS | Status: AC
  Administered 2018-05-19: 1000 mL via INTRAVENOUS

## 2018-05-19 MED ORDER — FENTANYL BOLUS VIA INFUSION
50.0000 ug | INTRAVENOUS | Status: DC | PRN
  Administered 2018-05-19 – 2018-05-20 (×6): 50 ug via INTRAVENOUS
  Filled 2018-05-19: qty 50

## 2018-05-19 MED ORDER — KETOROLAC TROMETHAMINE 30 MG/ML IJ SOLN
30.0000 mg | Freq: Once | INTRAMUSCULAR | Status: AC
  Administered 2018-05-19: 30 mg via INTRAVENOUS
  Filled 2018-05-19: qty 1

## 2018-05-19 NOTE — Progress Notes (Signed)
PT Cancellation Note  Patient Details Name: [name] Pace MRN: [id number] DOB: 03/16/1951   Cancelled Treatment:    Reason Eval/Treat Not Completed: Other (comment). Noted rapid response events from yesterday, pt intubated and sedated 12/13. Will follow-up for PT treatment Monday for re-evaluation if appropriate.  Mabeline Caras, PT, DPT Acute Rehabilitation Services  Pager [phone] Office [place] 05/19/2018, 7:29 AM

## 2018-05-19 NOTE — Progress Notes (Signed)
MD Agarwala aware of low urine output. Irrigated foley without concern. Will continue to monitor. Shella Spearing, RN

## 2018-05-19 NOTE — Progress Notes (Signed)
NAME:  [name] Pace, MRN:  [id number], DOB:  1950-10-18, LOS: 5 ADMISSION DATE:  05/13/2018, CONSULTATION DATE: 05/18/2018 REFERRING MD: [facility] hospitalist, CHIEF COMPLAINT: Respiratory distress  HPI/course in hospital  67 year old man with history of smoking and alcohol abuse. Initially admitted 12/9 after sustaining a fall with multiple right-sided rib fractures but no pneumothorax. Increasing agitation thought to be due to alcohol withdrawal. Worsening oxygen requirements leading to intubation.  Past Medical History  He,  has a past medical history of Alcohol abuse, Anxiety, Breast cancer ([place]) (Left breast), Coronary artery disease, High cholesterol, Prostate cancer ([place]), and Stroke ([place]).  Past Surgical History:  Procedure Laterality Date  . CAROTID STENT    . CORONARY ARTERY BYPASS GRAFT    . FEMUR IM NAIL Left 07/02/2014   Procedure: INTRAMEDULLARY (IM) NAIL FEMORAL;  Surgeon: Elie Goody, MD;  Location: [facility];  Service: Orthopedics;  Laterality: Left;  . I&D EXTREMITY Right 02/10/2016   Procedure: IRRIGATION AND DEBRIDEMENT EXTREMITY;  Surgeon: Dorna Leitz, MD;  Location: [place];  Service: Orthopedics;  Laterality: Right;  . TENDON REPAIR Right 02/10/2016   Procedure: Anterior TENDON REPAIR and wound exploration.;  Surgeon: Dorna Leitz, MD;  Location: [place];  Service: Orthopedics;  Laterality: Right;      Interim history/subjective:  Continues to be agitated requiring ongoing IV sedation.  Objective   Blood pressure 101/74, pulse (!) 51, temperature (!) 97.3 F (36.3 C), temperature source Axillary, resp. rate 16, height 6\' 2"  (1.88 m), weight 77.4 kg, SpO2 100 %.    Vent Mode: PRVC FiO2 (%):  [40 %-50 %] 40 % Set Rate:  [16 bmp] 16 bmp Vt Set:  [650 mL] 650 mL PEEP:  [5 cmH20] 5 cmH20 Plateau Pressure:  [16 cmH20-17 cmH20] 16 cmH20   Intake/Output Summary (Last 24 hours) at 05/19/2018 1619 Last data filed at 05/19/2018 1500 Gross per 24 hour  Intake 5071.65 ml   Output 985 ml  Net 4086.65 ml   Filed Weights   05/17/18 0400 05/18/18 0800 05/19/18 0500  Weight: 73.9 kg 73.3 kg 77.4 kg    Examination: Physical Exam  Vitals reviewed. Constitutional: He appears well-developed.  HENT:  Head: Normocephalic and atraumatic.  Cardiovascular: Normal rate and regular rhythm.  Respiratory: He exhibits tenderness.  Symmetric chest rise.  Reduced chest excursion on right side  GI: Soft. Bowel sounds are normal.  Musculoskeletal:     Comments: Extensive ecchymosis along right side of the body.  Neurological:  Sedated on dexmedetomidine.  Skin: Skin is warm and dry.     Ancillary tests (personally reviewed)  CBC: Recent Labs  Lab 05/14/18 0025 05/14/18 0626 05/15/18 0134 05/16/18 0445 05/17/18 0338 05/18/18 0721 05/19/18 0915  WBC 13.5* 10.9* 13.2* 11.1* 10.3 11.0* 8.1  NEUTROABS 11.4* 8.9*  --   --   --   --   --   HGB 14.9 14.0 13.9 13.4 13.7 15.1 11.4*  HCT 42.6 40.1 39.6 38.8* 40.2 44.1 33.0*  MCV 102.2* 99.3 100.8* 102.9* 101.5* 103.8* 102.5*  PLT 203 239 224 228 275 286 010    Basic Metabolic Panel: Recent Labs  Lab 05/16/18 0445 05/17/18 0338 05/17/18 2022 05/18/18 0721 05/19/18 0915  NA 126* 130* 131* 135 137  K 3.9 3.6 3.6 4.2 3.2*  CL 94* 96* 96* 101 110  CO2 26 21* 22 20* 20*  GLUCOSE 116* 95 124* 145* 136*  BUN <5* <5* 6* 6* 10  CREATININE 0.70 0.59* 0.62 0.51* 0.55*  CALCIUM  8.0* 8.4* 8.3* 8.4* 7.4*  MG  --   --   --   --  1.7  PHOS  --   --   --   --  2.5   GFR: Estimated Creatinine Clearance: 100.8 mL/min (A) (by C-G formula based on SCr of 0.55 mg/dL (L)). Recent Labs  Lab 05/16/18 0445 05/17/18 0338 05/18/18 0721 05/19/18 0915  WBC 11.1* 10.3 11.0* 8.1    Liver Function Tests: Recent Labs  Lab 05/14/18 0639  AST 34  ALT 32  ALKPHOS 57  BILITOT 1.4*  PROT 6.1*  ALBUMIN 3.0*   No results for input(s): LIPASE, AMYLASE in the last 168 hours. No results for input(s): AMMONIA in the last 168  hours.  ABG    Component Value Date/Time   PHART 7.470 (H) 05/19/2018 0451   PCO2ART 28.4 (L) 05/19/2018 0451   PO2ART 111.0 (H) 05/19/2018 0451   HCO3 20.7 05/19/2018 0451   TCO2 22 05/19/2018 0451   ACIDBASEDEF 2.0 05/19/2018 0451   O2SAT 99.0 05/19/2018 0451     Coagulation Profile: No results for input(s): INR, PROTIME in the last 168 hours.  Cardiac Enzymes: Recent Labs  Lab 05/14/18 1411  CKTOTAL 218    HbA1C: No results found for: HGBA1C  CBG: Recent Labs  Lab 05/18/18 2334 05/19/18 0343 05/19/18 0751 05/19/18 1139 05/19/18 1554  GLUCAP 120* 117* 143* 152* 106*     Assessment & Plan:  Likely ill due to acute respiratory failure requiring mechanical ventilation. Significant agitation now not due to alcohol withdrawal but rather due to pain from rib fractures. Respiratory failure due to impaired chest mechanics with lung contusion and compressive atelectasis. Prior history of alcohol abuse Hypotension requiring fluids and titration of vasopressors Oliguria  PLAN:  Continue full mechanical ventilatory support. No weaning until pain better controlled Multimodal pain control including titration of continuous fentanyl infusion, initiation of nonsteroidal anti-inflammatory and topical lidocaine.  Continued acetaminophen and gabapentin. Continue to titrate vasopressors to maintain adequate blood pressure. Wean dexmedetomidine as tolerated as this will improve pressor requirements. Gentle fluid challenge.  Best practice:  Diet: Tube feed protocol Pain/Anxiety/Delirium protocol (if indicated): As outlined above VAP protocol (if indicated): Bundle in place DVT prophylaxis: Lovenox GI prophylaxis: Famotidine Glucose control: Euglycemic Mobility: Bedrest Code Status: Full code Family Communication: Wife updated at bedside Disposition: Remain in ICU.   Critical care time: 35 minutes of critical care time spent evaluating the patient reviewing his chest  radiology and making ventilator adjustments, titrating and readjusting pain medication titrating vasopressors and fluid management.    Kipp Brood, MD [facility] ICU Physician [place]  Pager: [phone] Mobile: [phone] After hours: [phone].  05/19/2018, 4:19 PM

## 2018-05-20 LAB — CBC WITH DIFFERENTIAL/PLATELET
ABS IMMATURE GRANULOCYTES: 0.09 10*3/uL — AB (ref 0.00–0.07)
Basophils Absolute: 0.1 10*3/uL (ref 0.0–0.1)
Basophils Relative: 1 %
Eosinophils Absolute: 0.3 10*3/uL (ref 0.0–0.5)
Eosinophils Relative: 3 %
HCT: 35.9 % — ABNORMAL LOW (ref 39.0–52.0)
Hemoglobin: 11.5 g/dL — ABNORMAL LOW (ref 13.0–17.0)
Immature Granulocytes: 1 %
Lymphocytes Relative: 13 %
Lymphs Abs: 1.3 10*3/uL (ref 0.7–4.0)
MCH: 34.2 pg — ABNORMAL HIGH (ref 26.0–34.0)
MCHC: 32 g/dL (ref 30.0–36.0)
MCV: 106.8 fL — ABNORMAL HIGH (ref 80.0–100.0)
Monocytes Absolute: 1.2 10*3/uL — ABNORMAL HIGH (ref 0.1–1.0)
Monocytes Relative: 12 %
Neutro Abs: 7.1 10*3/uL (ref 1.7–7.7)
Neutrophils Relative %: 70 %
Platelets: 354 10*3/uL (ref 150–400)
RBC: 3.36 MIL/uL — AB (ref 4.22–5.81)
RDW: 12.6 % (ref 11.5–15.5)
WBC: 10.2 10*3/uL (ref 4.0–10.5)
nRBC: 0 % (ref 0.0–0.2)

## 2018-05-20 LAB — GLUCOSE, CAPILLARY
GLUCOSE-CAPILLARY: 104 mg/dL — AB (ref 70–99)
Glucose-Capillary: 110 mg/dL — ABNORMAL HIGH (ref 70–99)
Glucose-Capillary: 101 mg/dL — ABNORMAL HIGH (ref 70–99)
Glucose-Capillary: 77 mg/dL (ref 70–99)

## 2018-05-20 LAB — BASIC METABOLIC PANEL
Anion gap: 10 (ref 5–15)
BUN: 14 mg/dL (ref 8–23)
CO2: 20 mmol/L — ABNORMAL LOW (ref 22–32)
Calcium: 7.5 mg/dL — ABNORMAL LOW (ref 8.9–10.3)
Chloride: 108 mmol/L (ref 98–111)
Creatinine, Ser: 0.55 mg/dL — ABNORMAL LOW (ref 0.61–1.24)
GFR calc Af Amer: >60 mL/min (ref >60)
GFR calc non Af Amer: >60 mL/min (ref >60)
Glucose, Bld: 123 mg/dL — ABNORMAL HIGH (ref 70–99)
Potassium: 4 mmol/L (ref 3.5–5.1)
SODIUM: 138 mmol/L (ref 135–145)

## 2018-05-20 LAB — PHOSPHORUS
PHOSPHORUS: 2.6 mg/dL (ref 2.5–4.6)
Phosphorus: 2.9 mg/dL (ref 2.5–4.6)

## 2018-05-20 LAB — MAGNESIUM
MAGNESIUM: 1.6 mg/dL — AB (ref 1.7–2.4)
Magnesium: 1.6 mg/dL — ABNORMAL LOW (ref 1.7–2.4)

## 2018-05-20 LAB — CORTISOL: Cortisol, Plasma: 6 ug/dL

## 2018-05-20 MED ORDER — ACETAMINOPHEN 500 MG PO TABS: 1000.0000 mg | ORAL_TABLET | Freq: Three times a day (TID) | ORAL | Status: DC

## 2018-05-20 MED ORDER — DEXTROSE 50 % IV SOLN
25.0000 mL | Freq: Once | INTRAVENOUS | Status: AC
  Administered 2018-05-20: 25 mL via INTRAVENOUS
  Filled 2018-05-20: qty 50

## 2018-05-20 MED ORDER — SODIUM CHLORIDE 0.9 % IV SOLN
0.0000 mg/h | INTRAVENOUS | Status: DC
  Administered 2018-05-20 – 2018-05-21 (×2): 2 mg/h via INTRAVENOUS
  Administered 2018-05-22: 3 mg/h via INTRAVENOUS
  Filled 2018-05-20 (×4): qty 5

## 2018-05-20 MED ORDER — ACETAMINOPHEN 500 MG PO TABS
1000.0000 mg | ORAL_TABLET | Freq: Three times a day (TID) | ORAL | Status: DC
  Administered 2018-05-20 – 2018-05-21 (×2): 1000 mg
  Filled 2018-05-20 (×2): qty 2

## 2018-05-20 MED ORDER — HYDROMORPHONE BOLUS VIA INFUSION
1.0000 mg | INTRAVENOUS | Status: DC | PRN
  Administered 2018-05-20 – 2018-05-21 (×4): 1 mg via INTRAVENOUS
  Filled 2018-05-20: qty 1

## 2018-05-20 MED ORDER — MAGNESIUM SULFATE IN D5W 1-5 GM/100ML-% IV SOLN
1.0000 g | Freq: Once | INTRAVENOUS | Status: AC
  Administered 2018-05-20: 1 g via INTRAVENOUS
  Filled 2018-05-20: qty 100

## 2018-05-20 NOTE — Progress Notes (Signed)
NAME:  [name] Pace, MRN:  [id number], DOB:  March 13, 1951, LOS: 6 ADMISSION DATE:  05/13/2018, CONSULTATION DATE:  05/20/18 REFERRING MD:  Dr. Earnest Conroy, CHIEF COMPLAINT:  Tachypnea    Brief History   67 y/o M, smoker, admitted 12/8 after a fall with right sided chest pain.  Work up revealed multiple right sided rib fractures and small pneumothorax which did not require a chest tube.  PCCM called to see the patient  12/13 for tachypnea and increasing O2 needs. At that time, CXR showed resolution of pneumothorax.  ABG 7.45 / 36 / 88.     Past Medical History  Breast cancer  CVA Prostate cancer  Anxiety  ETOH abuse  HLD   Significant Hospital Events   12/08 Admit after fall with rib fractures, small PTX 12/09 CIWA protocol for ETOH withdrawal 12/13 Rapid response PCCM consulted  12/14 Precedex changed to Fentanyl for pain control  Consults:  PCCM 12/13   Procedures:    Significant Diagnostic Tests:  CT Chest 12/8 >> multiple mildly displaced right rib fractures 5-11th ribs, minimal right apical pneumothorax, rounded structure in the anterior mediastinum, likely a saccular aneurysm CT Chest 12/14 >> no PTX, moderate bilateral pleural effusions with adjacent atelectasis, multiple moderately displaced rib fractures with overlying sq emphysema of right chest wall, 2.4 cm rounded abnormality to the left of the ascending thoracid aorta which is concerning for possible aneurysm   Micro Data:  UA 12/12 >> many bacteria, nitrite +, small leukocytes, 21-50 WBC  Antimicrobials:  Levaquin 12/13 >>  Flagyl 12/13 >>   Interim history/subjective:  RN reports aline non-functional.  Levophed at 8 mcg, fentanyl at 453mcg's.   Objective   Blood pressure (!) 136/117, pulse 74, temperature 98.3 F (36.8 C), temperature source Axillary, resp. rate (!) 21, height 6\' 2"  (1.88 m), weight 80.2 kg, SpO2 98 %.    Vent Mode: PRVC FiO2 (%):  [40 %] 40 % Set Rate:  [16 bmp] 16 bmp Vt Set:  [650 mL] 650  mL PEEP:  [5 cmH20] 5 cmH20 Pressure Support:  [5 cmH20] 5 cmH20 Plateau Pressure:  [13 cmH20-16 cmH20] 16 cmH20   Intake/Output Summary (Last 24 hours) at 05/20/2018 1532 Last data filed at 05/20/2018 1200 Gross per 24 hour  Intake 4424.65 ml  Output 595 ml  Net 3829.65 ml   Filed Weights   05/18/18 0800 05/19/18 0500 05/20/18 0411  Weight: 73.3 kg 77.4 kg 80.2 kg    Examination: General: elderly male lying in bed in NAD on vent  HEENT: MM pink/moist, ETT  Neuro: opens eyes, nods appropriately, interacts  CV: s1s2 rrr, no m/r/g PULM: even/non-labored, lungs bilaterally clear, tender to touch on right side  WU:JWJX, non-tender, bsx4 active  Extremities: warm/dry, no edema  Skin: no rashes or lesions  Resolved Hospital Problem list      Assessment & Plan:   Mechanical Fall with Multiple Rib Fractures Pain  P: Continue Fentanyl gtt for pain  ReduceTylenol to 1gm Q8  Gabapentin   ETOH Withdrawal -hx CVA P: Continue precedex gtt  RASS Goal: 0 to -1  Valium 5mg  PT TID  Follow serial neuro exams  Thiamine, folate  Acute Hypoxic Respiratory Failure  -in setting multiple rib fractures, atelectasis, pneumothorax  P: PRVC 8 cc/kg  Wean PEEP / FiO2 as able for sats 90-95% Follow ABG PRN  Intermittent CXR  Duoneb TID + PRN   Pneumothorax  P: Resolved, monitor   Shock   -suspect component sepsis with  UTI and sedation effects P: Levophed for MAP >65  D5NS at 100 ml/hr   Fever -concern for possible aspiration PNA P: Follow cultures  ABX as above   UTI  P: Continue levaquin   Hyponatremia  -likely beer potomania  P: Gentle IVF as above   Hypomagnesemia  P: Replace Mg 12/15  Best practice:  Diet: TF  Pain/Anxiety/Delirium protocol (if indicated): in place  VAP protocol (if indicated): in place  DVT prophylaxis: scd's  GI prophylaxis: pepcid  Glucose control: n/a  Mobility: bedrest  Code Status: Full code  Family Communication: Daughter  updated at bedside  Disposition: ICU  Labs   CBC: Recent Labs  Lab 05/14/18 0025 05/14/18 0626  05/16/18 0445 05/17/18 0338 05/18/18 0721 05/19/18 0915 05/20/18 0358  WBC 13.5* 10.9*   < > 11.1* 10.3 11.0* 8.1 10.2  NEUTROABS 11.4* 8.9*  --   --   --   --   --  7.1  HGB 14.9 14.0   < > 13.4 13.7 15.1 11.4* 11.5*  HCT 42.6 40.1   < > 38.8* 40.2 44.1 33.0* 35.9*  MCV 102.2* 99.3   < > 102.9* 101.5* 103.8* 102.5* 106.8*  PLT 203 239   < > 228 275 286 265 354   < > = values in this interval not displayed.    Basic Metabolic Panel: Recent Labs  Lab 05/17/18 0338 05/17/18 2022 05/18/18 0721 05/19/18 0915 05/20/18 0358  NA 130* 131* 135 137 138  K 3.6 3.6 4.2 3.2* 4.0  CL 96* 96* 101 110 108  CO2 21* 22 20* 20* 20*  GLUCOSE 95 124* 145* 136* 123*  BUN <5* 6* 6* 10 14  CREATININE 0.59* 0.62 0.51* 0.55* 0.55*  CALCIUM 8.4* 8.3* 8.4* 7.4* 7.5*  MG  --   --   --  1.7 1.6*  PHOS  --   --   --  2.5 2.6   GFR: Estimated Creatinine Clearance: 104.4 mL/min (A) (by C-G formula based on SCr of 0.55 mg/dL (L)). Recent Labs  Lab 05/17/18 0338 05/18/18 0721 05/19/18 0915 05/20/18 0358  WBC 10.3 11.0* 8.1 10.2    Liver Function Tests: Recent Labs  Lab 05/14/18 0639  AST 34  ALT 32  ALKPHOS 57  BILITOT 1.4*  PROT 6.1*  ALBUMIN 3.0*   No results for input(s): LIPASE, AMYLASE in the last 168 hours. No results for input(s): AMMONIA in the last 168 hours.  ABG    Component Value Date/Time   PHART 7.470 (H) 05/19/2018 0451   PCO2ART 28.4 (L) 05/19/2018 0451   PO2ART 111.0 (H) 05/19/2018 0451   HCO3 20.7 05/19/2018 0451   TCO2 22 05/19/2018 0451   ACIDBASEDEF 2.0 05/19/2018 0451   O2SAT 99.0 05/19/2018 0451     Coagulation Profile: No results for input(s): INR, PROTIME in the last 168 hours.  Cardiac Enzymes: Recent Labs  Lab 05/14/18 1411  CKTOTAL 218    HbA1C: No results found for: HGBA1C  CBG: Recent Labs  Lab 05/19/18 1957 05/19/18 2349  05/20/18 0345 05/20/18 0805 05/20/18 1122  GLUCAP 133* 104* 110* 101* 77    Critical care time:     Noe Gens, NP-C [facility] Pgr: [phone] or if no answer [phone] 05/20/2018, 3:32 PM

## 2018-05-20 NOTE — Progress Notes (Signed)
Sallye Ober, NP paged to notify of CBG of 77 and see if new orders are needed.

## 2018-05-20 NOTE — Progress Notes (Signed)
Per Dr. Lynetta Mare, Sallye Ober, NP is off for the evening.  Per AMion, scheduled to 1900.  New order received for 1/2 amp of D50 for CBG 77.

## 2018-05-21 ENCOUNTER — Inpatient Hospital Stay (HOSPITAL_COMMUNITY): Payer: Medicare Other

## 2018-05-21 DIAGNOSIS — I370 Nonrheumatic pulmonary valve stenosis: Secondary | ICD-10-CM

## 2018-05-21 DIAGNOSIS — I361 Nonrheumatic tricuspid (valve) insufficiency: Secondary | ICD-10-CM

## 2018-05-21 LAB — GLUCOSE, CAPILLARY
GLUCOSE-CAPILLARY: 121 mg/dL — AB (ref 70–99)
Glucose-Capillary: 101 mg/dL — ABNORMAL HIGH (ref 70–99)
Glucose-Capillary: 103 mg/dL — ABNORMAL HIGH (ref 70–99)
Glucose-Capillary: 113 mg/dL — ABNORMAL HIGH (ref 70–99)
Glucose-Capillary: 122 mg/dL — ABNORMAL HIGH (ref 70–99)
Glucose-Capillary: 135 mg/dL — ABNORMAL HIGH (ref 70–99)
Glucose-Capillary: 61 mg/dL — ABNORMAL LOW (ref 70–99)
Glucose-Capillary: 81 mg/dL (ref 70–99)
Glucose-Capillary: 90 mg/dL (ref 70–99)
Glucose-Capillary: 96 mg/dL (ref 70–99)

## 2018-05-21 LAB — CBC WITH DIFFERENTIAL/PLATELET
ABS IMMATURE GRANULOCYTES: 0.07 10*3/uL (ref 0.00–0.07)
BASOS PCT: 1 %
Basophils Absolute: 0 10*3/uL (ref 0.0–0.1)
Eosinophils Absolute: 0.3 10*3/uL (ref 0.0–0.5)
Eosinophils Relative: 3 %
HCT: 35.3 % — ABNORMAL LOW (ref 39.0–52.0)
Hemoglobin: 11.2 g/dL — ABNORMAL LOW (ref 13.0–17.0)
Immature Granulocytes: 1 %
Lymphocytes Relative: 13 %
Lymphs Abs: 1.1 10*3/uL (ref 0.7–4.0)
MCH: 34.5 pg — ABNORMAL HIGH (ref 26.0–34.0)
MCHC: 31.7 g/dL (ref 30.0–36.0)
MCV: 108.6 fL — ABNORMAL HIGH (ref 80.0–100.0)
Monocytes Absolute: 0.9 10*3/uL (ref 0.1–1.0)
Monocytes Relative: 11 %
NEUTROS ABS: 5.8 10*3/uL (ref 1.7–7.7)
Neutrophils Relative %: 71 %
PLATELETS: 346 10*3/uL (ref 150–400)
RBC: 3.25 MIL/uL — ABNORMAL LOW (ref 4.22–5.81)
RDW: 13.1 % (ref 11.5–15.5)
WBC: 8.1 10*3/uL (ref 4.0–10.5)
nRBC: 0 % (ref 0.0–0.2)

## 2018-05-21 LAB — BASIC METABOLIC PANEL
Anion gap: 8 (ref 5–15)
Anion gap: 9 (ref 5–15)
BUN: 13 mg/dL (ref 8–23)
BUN: 17 mg/dL (ref 8–23)
CO2: 21 mmol/L — ABNORMAL LOW (ref 22–32)
CO2: 24 mmol/L (ref 22–32)
Calcium: 7.6 mg/dL — ABNORMAL LOW (ref 8.9–10.3)
Calcium: 7.9 mg/dL — ABNORMAL LOW (ref 8.9–10.3)
Chloride: 110 mmol/L (ref 98–111)
Chloride: 107 mmol/L (ref 98–111)
Creatinine, Ser: 0.61 mg/dL (ref 0.61–1.24)
Creatinine, Ser: 0.6 mg/dL — ABNORMAL LOW (ref 0.61–1.24)
GFR calc Af Amer: >60 mL/min (ref >60)
GFR calc Af Amer: >60 mL/min (ref >60)
GFR calc non Af Amer: >60 mL/min (ref >60)
Glucose, Bld: 107 mg/dL — ABNORMAL HIGH (ref 70–99)
Glucose, Bld: 97 mg/dL (ref 70–99)
Potassium: 4.3 mmol/L (ref 3.5–5.1)
Potassium: 4.5 mmol/L (ref 3.5–5.1)
Sodium: 139 mmol/L (ref 135–145)
Sodium: 140 mmol/L (ref 135–145)

## 2018-05-21 LAB — ECHOCARDIOGRAM COMPLETE
Height: 74 in
Weight: 2977.09 oz

## 2018-05-21 MED ORDER — POTASSIUM CHLORIDE 20 MEQ/15ML (10%) PO SOLN
40.0000 meq | Freq: Two times a day (BID) | ORAL | Status: DC
  Administered 2018-05-21 – 2018-05-23 (×5): 40 meq
  Filled 2018-05-21 (×5): qty 30

## 2018-05-21 MED ORDER — HYDROMORPHONE BOLUS VIA INFUSION
0.5000 mg | INTRAVENOUS | Status: DC | PRN
  Administered 2018-05-23: 2 mg via INTRAVENOUS
  Filled 2018-05-21: qty 2

## 2018-05-21 MED ORDER — MIDAZOLAM HCL (PF) 5 MG/ML IJ SOLN: 1.0000 mg | INTRAMUSCULAR | Status: DC | PRN

## 2018-05-21 MED ORDER — MIDAZOLAM HCL 2 MG/2ML IJ SOLN: 2.0000 mg | Freq: Once | INTRAMUSCULAR | Status: DC

## 2018-05-21 MED ORDER — FUROSEMIDE 10 MG/ML IJ SOLN
80.0000 mg | Freq: Three times a day (TID) | INTRAMUSCULAR | Status: DC
  Administered 2018-05-21 – 2018-05-25 (×12): 80 mg via INTRAVENOUS
  Filled 2018-05-21 (×12): qty 8

## 2018-05-21 MED ORDER — MAGNESIUM SULFATE IN D5W 1-5 GM/100ML-% IV SOLN
1.0000 g | Freq: Once | INTRAVENOUS | Status: AC
  Administered 2018-05-21: 1 g via INTRAVENOUS
  Filled 2018-05-21: qty 100

## 2018-05-21 MED ORDER — DEXMEDETOMIDINE HCL IN NACL 200 MCG/50ML IV SOLN
0.4000 ug/kg/h | INTRAVENOUS | Status: DC
  Administered 2018-05-21: 0.4 ug/kg/h via INTRAVENOUS
  Administered 2018-05-21: 0.6 ug/kg/h via INTRAVENOUS
  Administered 2018-05-21: 1.2 ug/kg/h via INTRAVENOUS
  Administered 2018-05-21: 0.9 ug/kg/h via INTRAVENOUS
  Administered 2018-05-22: 0.3 ug/kg/h via INTRAVENOUS
  Administered 2018-05-22 – 2018-05-23 (×3): 0.4 ug/kg/h via INTRAVENOUS
  Filled 2018-05-21: qty 50
  Filled 2018-05-21: qty 100
  Filled 2018-05-21 (×5): qty 50

## 2018-05-21 MED ORDER — MIDAZOLAM HCL 2 MG/2ML IJ SOLN
1.0000 mg | INTRAMUSCULAR | Status: DC | PRN
  Administered 2018-05-21 – 2018-05-22 (×5): 2 mg via INTRAVENOUS
  Filled 2018-05-21 (×5): qty 2

## 2018-05-21 MED ORDER — MIDAZOLAM HCL (PF) 5 MG/ML IJ SOLN: 2.0000 mg | Freq: Once | INTRAMUSCULAR | Status: DC

## 2018-05-21 NOTE — Progress Notes (Signed)
PT Cancellation Note  Patient Details Name: [name] Pace MRN: [id number] DOB: [birth year]   Cancelled Treatment:    Reason Eval/Treat Not Completed: Patient not medically ready;Medical issues which prohibited therapy(intubated, not appropriate for therapies)   Duncan Dull 05/21/2018, 10:54 AM

## 2018-05-21 NOTE — Progress Notes (Signed)
  Echocardiogram 2D Echocardiogram has been performed.  Johny Chess 05/21/2018, 4:26 PM

## 2018-05-21 NOTE — Progress Notes (Signed)
NAME:  [name] Pace, MRN:  [id number], DOB:  11-09-1950, LOS: 7 ADMISSION DATE:  05/13/2018, CONSULTATION DATE:  05/20/18 REFERRING MD:  Dr. Earnest Conroy, CHIEF COMPLAINT:  Tachypnea    Brief History   67 y/o M, smoker, admitted 12/8 after a fall with right sided chest pain.  Work up revealed multiple right sided rib fractures and small pneumothorax which did not require a chest tube.  PCCM called to see the patient  12/13 for tachypnea and increasing O2 needs. At that time, CXR showed resolution of pneumothorax.  ABG 7.45 / 36 / 88.     Past Medical History  Breast cancer  CVA Prostate cancer  Anxiety  ETOH abuse  HLD   Significant Hospital Events   12/08 Admit after fall with rib fractures, small PTX 12/09 CIWA protocol for ETOH withdrawal 12/13 Rapid response PCCM consulted  12/14 Precedex changed to Fentanyl for pain control 12/15- RN reports aline non-functional.  Levophed at 8 mcg, fentanyl at 456mcg's.  Consults:  PCCM 12/13   Procedures:    Significant Diagnostic Tests:  CT Chest 12/8 >> multiple mildly displaced right rib fractures 5-11th ribs, minimal right apical pneumothorax, rounded structure in the anterior mediastinum, likely a saccular aneurysm CT Chest 12/14 >> no PTX, moderate bilateral pleural effusions with adjacent atelectasis, multiple moderately displaced rib fractures with overlying sq emphysema of right chest wall, 2.4 cm rounded abnormality to the left of the ascending thoracid aorta which is concerning for possible aneurysm   Micro Data:  UA 12/12 >> many bacteria, nitrite +, small leukocytes, 21-50 WBC  Antimicrobials:  Levaquin 12/13 >>  Flagyl 12/13 >>   Interim history/subjective:   12/16-  precedex started this morning due to agitation despite dilaudid gtt => helping / + 14L since admit. Off levophed. On TF. Getting 200cc/h saline.   Objective   Blood pressure 122/62, pulse 61, temperature 98.7 F (37.1 C), temperature source Oral, resp. rate  17, height 6\' 2"  (1.88 m), weight 84.4 kg, SpO2 100 %.    Vent Mode: PRVC FiO2 (%):  [40 %] 40 % Set Rate:  [16 bmp] 16 bmp Vt Set:  [650 mL] 650 mL PEEP:  [5 cmH20] 5 cmH20 Pressure Support:  [10 cmH20] 10 cmH20 Plateau Pressure:  [14 cmH20-21 cmH20] 16 cmH20   Intake/Output Summary (Last 24 hours) at 05/21/2018 1316 Last data filed at 05/21/2018 1200 Gross per 24 hour  Intake 2669.27 ml  Output 935 ml  Net 1734.27 ml   Filed Weights   05/19/18 0500 05/20/18 0411 05/21/18 0448  Weight: 77.4 kg 80.2 kg 84.4 kg  General Appearance:  Looks criticall ill OBESE - yes Head:  Normocephalic, without obvious abnormality, atraumatic Eyes:  PERRL - yes, conjunctiva/corneas - clear     Ears:  Normal external ear canals, both ears Nose:  G tube - yes Throat:  ETT TUBE - yes , OG tube - no Neck:  Supple,  No enlargement/tenderness/nodules Lungs: Clear to auscultation bilaterally, Ventilator   Synchrony - yes Heart:  S1 and S2 normal, no murmur, CVP - x.  Pressors - no Abdomen:  Soft, no masses, no organomegaly Genitalia / Rectal:  Not done Extremities:  Extremities- intact Skin:  ntact in exposed areas . Sacral area - not examined Neurologic:  Sedation - dialudid gtt, precedex gtt -> RASS - -2 . Moves all 4s - x. CAM-ICU - +ve delirium during wua . Orientation - not oriented      LABS  PULMONARY Recent Labs  Lab 05/18/18 0413 05/18/18 0914 05/19/18 0451  PHART 7.452* 7.384 7.470*  PCO2ART 36.3 35.5 28.4*  PO2ART 87.9 266.0* 111.0*  HCO3 25.0 21.2 20.7  TCO2  --  22 22  O2SAT 97.1 100.0 99.0    CBC Recent Labs  Lab 05/19/18 0915 05/20/18 0358 05/21/18 0438  HGB 11.4* 11.5* 11.2*  HCT 33.0* 35.9* 35.3*  WBC 8.1 10.2 8.1  PLT 265 354 346    COAGULATION No results for input(s): INR in the last 168 hours.  CARDIAC  No results for input(s): TROPONINI in the last 168 hours. No results for input(s): PROBNP in the last 168 hours.   CHEMISTRY Recent Labs  Lab  05/17/18 2022 05/18/18 0721 05/19/18 0915 05/20/18 0358 05/20/18 1626 05/21/18 0438  NA 131* 135 137 138  --  139  K 3.6 4.2 3.2* 4.0  --  4.3  CL 96* 101 110 108  --  110  CO2 22 20* 20* 20*  --  21*  GLUCOSE 124* 145* 136* 123*  --  107*  BUN 6* 6* 10 14  --  13  CREATININE 0.62 0.51* 0.55* 0.55*  --  0.61  CALCIUM 8.3* 8.4* 7.4* 7.5*  --  7.6*  MG  --   --  1.7 1.6* 1.6*  --   PHOS  --   --  2.5 2.6 2.9  --    Estimated Creatinine Clearance: 107 mL/min (by C-G formula based on SCr of 0.61 mg/dL).   LIVER No results for input(s): AST, ALT, ALKPHOS, BILITOT, PROT, ALBUMIN, INR in the last 168 hours.   INFECTIOUS No results for input(s): LATICACIDVEN, PROCALCITON in the last 168 hours.   ENDOCRINE CBG (last 3)  Recent Labs    05/21/18 0352 05/21/18 0745 05/21/18 1135  GLUCAP 81 96 121*         IMAGING x48h  - image(s) personally visualized  -   highlighted in bold Dg Chest Port 1 View  Result Date: 05/21/2018 CLINICAL DATA:  Ventilator support EXAM: PORTABLE CHEST 1 VIEW COMPARISON:  05/19/2018 FINDINGS: Endotracheal tube tip is 3 cm above the carina. Nasogastric tube enters the abdomen. Right internal jugular central line tip is in the SVC 2 cm above the right atrium. Bilateral lower lobe volume loss persists with pleural fluid on the right. Findings are slightly worsened since 2 days ago. IMPRESSION: Worsening lower lobe volume loss and effusions right larger than left. Electronically Signed   By: Nelson Chimes M.D.   On: 05/21/2018 07:26   Dg Chest Port 1 View  Result Date: 05/19/2018 CLINICAL DATA:  Hemothorax EXAM: PORTABLE CHEST 1 VIEW COMPARISON:  05/19/2018 FINDINGS: Support devices are stable. Multiple right lateral rib fractures are again noted. Bilateral pleural effusions, right greater than left. Bibasilar atelectasis or infiltrates. No visible pneumothorax. IMPRESSION: No significant change since prior study. Electronically Signed   By: Rolm Baptise  M.D.   On: 05/19/2018 16:09     Resolved Hospital Problem list      Assessment & Plan:   Mechanical Fall with Multiple Rib Fractures SEvere Pain with agitated delirium from pain and etoh withdrawal and hx of CVA  P: Dilaudid gtt Gabapentin - low threshold to dc fent patch precedex gtt Dc valium RASS goal 0 to -2  Acute Hypoxic Respiratory Failure  -in setting multiple rib fractures, atelectasis, pneumothorax   05/21/2018 - > does not meet criteria for SBT/Extubation in setting of Acute Respiratory Failure due to agitated encephalopathy  P: PRVC  Pneumothorax  P: Resolved, monitor   Shock   -suspect component sepsis with UTI and sedation effects   - 12/16 - not on pressors P: Goal MAP >65  Dc fluids   Fever -concern for possible aspiration PNA P: Follow cultures  ABX as above   UTI  P: Continue levaquin - monitor QTC with EKG  Hyponatremia  -likely beer potomania  P: Gentle IVF as above   Hypomagnesemia  - repletd 05/20/18 P: Recheck ekg 12/117/19  Volume overload  - start diuresis   Best practice:  Diet: TF  Pain/Anxiety/Delirium protocol (if indicated): in place  VAP protocol (if indicated): in place  DVT prophylaxis: scd's  GI prophylaxis: pepcid  Glucose control: n/a  Mobility: bedrest  Code Status: Full code  Family Communication: Daughter updated at bedside 05/20/18. None at bedside 05/21/18 Disposition: ICU   ATTESTATION & SIGNATURE   The patient [name] Pace is critically ill with multiple organ systems failure and requires high complexity decision making for assessment and support, frequent evaluation and titration of therapies, application of advanced monitoring technologies and extensive interpretation of multiple databases.   Critical Care Time devoted to patient care services described in this note is  30  Minutes. This time reflects time of care of this signee Dr Brand Males. This critical care time does not reflect  procedure time, or teaching time or supervisory time of PA/NP/Med student/Med Resident etc but could involve care discussion time     Dr. Brand Males, M.D., [facility].C.P Pulmonary and Critical Care Medicine Staff Physician [facility] Pager: [phone], If no answer or between  15:00h - 7:00h: call [phone]  05/21/2018 1:17 PM

## 2018-05-21 NOTE — Progress Notes (Signed)
Pt very agitated despite reorientation and calming techiniques.  CCM paged. Precedex gtt ordered and given. Pt now resting comfortably. Will continue to monitor.

## 2018-05-22 ENCOUNTER — Inpatient Hospital Stay (HOSPITAL_COMMUNITY): Payer: Medicare Other

## 2018-05-22 LAB — CBC WITH DIFFERENTIAL/PLATELET
Abs Immature Granulocytes: 0.08 10*3/uL — ABNORMAL HIGH (ref 0.00–0.07)
BASOS PCT: 0 %
Basophils Absolute: 0 10*3/uL (ref 0.0–0.1)
Eosinophils Absolute: 0.2 10*3/uL (ref 0.0–0.5)
Eosinophils Relative: 2 %
HCT: 33.5 % — ABNORMAL LOW (ref 39.0–52.0)
Hemoglobin: 10.9 g/dL — ABNORMAL LOW (ref 13.0–17.0)
Immature Granulocytes: 1 %
Lymphocytes Relative: 12 %
Lymphs Abs: 1 10*3/uL (ref 0.7–4.0)
MCH: 34.4 pg — ABNORMAL HIGH (ref 26.0–34.0)
MCHC: 32.5 g/dL (ref 30.0–36.0)
MCV: 105.7 fL — ABNORMAL HIGH (ref 80.0–100.0)
MONOS PCT: 9 %
Monocytes Absolute: 0.8 10*3/uL (ref 0.1–1.0)
Neutro Abs: 6.2 10*3/uL (ref 1.7–7.7)
Neutrophils Relative %: 76 %
Platelets: UNDETERMINED 10*3/uL (ref 150–400)
RBC: 3.17 MIL/uL — ABNORMAL LOW (ref 4.22–5.81)
RDW: 13.1 % (ref 11.5–15.5)
WBC: 8.3 10*3/uL (ref 4.0–10.5)
nRBC: 0 % (ref 0.0–0.2)

## 2018-05-22 LAB — CULTURE, RESPIRATORY W GRAM STAIN

## 2018-05-22 LAB — GLUCOSE, CAPILLARY
GLUCOSE-CAPILLARY: 106 mg/dL — AB (ref 70–99)
GLUCOSE-CAPILLARY: 119 mg/dL — AB (ref 70–99)
Glucose-Capillary: 99 mg/dL (ref 70–99)
Glucose-Capillary: 94 mg/dL (ref 70–99)
Glucose-Capillary: 96 mg/dL (ref 70–99)
Glucose-Capillary: 105 mg/dL — ABNORMAL HIGH (ref 70–99)

## 2018-05-22 LAB — HEPATIC FUNCTION PANEL
ALT: 15 U/L (ref 0–44)
AST: 36 U/L (ref 15–41)
Albumin: 1.5 g/dL — ABNORMAL LOW (ref 3.5–5.0)
Alkaline Phosphatase: 45 U/L (ref 38–126)
Bilirubin, Direct: 0.4 mg/dL — ABNORMAL HIGH (ref 0.0–0.2)
Indirect Bilirubin: 0.6 mg/dL (ref 0.3–0.9)
Total Bilirubin: 1 mg/dL (ref 0.3–1.2)
Total Protein: 4.7 g/dL — ABNORMAL LOW (ref 6.5–8.1)

## 2018-05-22 LAB — PHOSPHORUS: Phosphorus: 3.9 mg/dL (ref 2.5–4.6)

## 2018-05-22 LAB — CULTURE, RESPIRATORY

## 2018-05-22 LAB — LACTIC ACID, PLASMA: LACTIC ACID, VENOUS: 1.1 mmol/L (ref 0.5–1.9)

## 2018-05-22 LAB — MAGNESIUM: Magnesium: 1.8 mg/dL (ref 1.7–2.4)

## 2018-05-22 MED ORDER — OXYCODONE HCL 5 MG PO TABS
5.0000 mg | ORAL_TABLET | ORAL | Status: DC
  Administered 2018-05-22 – 2018-05-23 (×6): 5 mg
  Filled 2018-05-22 (×6): qty 1

## 2018-05-22 MED ORDER — MAGNESIUM SULFATE 2 GM/50ML IV SOLN
2.0000 g | Freq: Once | INTRAVENOUS | Status: AC
  Administered 2018-05-22: 2 g via INTRAVENOUS
  Filled 2018-05-22: qty 50

## 2018-05-22 MED ORDER — CLONAZEPAM 0.5 MG PO TBDP
0.5000 mg | ORAL_TABLET | Freq: Two times a day (BID) | ORAL | Status: DC
  Administered 2018-05-22 – 2018-05-23 (×3): 0.5 mg
  Filled 2018-05-22 (×3): qty 1

## 2018-05-22 MED ORDER — SODIUM CHLORIDE 0.9 % IV SOLN
INTRAVENOUS | Status: DC | PRN
  Administered 2018-05-22: 1000 mL via INTRAVENOUS
  Administered 2018-05-26: 10 mL/h via INTRAVENOUS

## 2018-05-22 NOTE — Progress Notes (Addendum)
NAME:  [name] Pace, MRN:  [id number], DOB:  June 23, 1950, LOS: 8 ADMISSION DATE:  05/13/2018, CONSULTATION DATE:  05/20/18 REFERRING MD:  Dr. Earnest Conroy, CHIEF COMPLAINT:  Tachypnea    Brief History   67 y/o M, smoker, admitted 12/8 after a fall with right sided chest pain.  Work up revealed multiple right sided rib fractures and small pneumothorax which did not require a chest tube.  PCCM called to see the patient  12/13 for tachypnea and increasing O2 needs. At that time, CXR showed resolution of pneumothorax.  ABG 7.45 / 36 / 88.     Past Medical History  Breast cancer  CVA Prostate cancer  Anxiety  ETOH abuse  HLD   Significant Hospital Events   12/08 Admit after fall with rib fractures, small PTX 12/09 CIWA protocol for ETOH withdrawal 12/13 Rapid response PCCM consulted  12/14 Precedex changed to Fentanyl for pain control 12/15- RN reports aline non-functional.  Levophed at 8 mcg, fentanyl at 475mcg's. 12/16-  precedex started this morning due to agitation despite dilaudid gtt => helping / + 14L since admit. Off levophed. On TF. Getting 200cc/h saline stopped/ diuresis started  Consults:  PCCM 12/13   Procedures:    Significant Diagnostic Tests:  CT Chest 12/8 >> multiple mildly displaced right rib fractures 5-11th ribs, minimal right apical pneumothorax, rounded structure in the anterior mediastinum, likely a saccular aneurysm CT Chest 12/14 >> no PTX, moderate bilateral pleural effusions with adjacent atelectasis, multiple moderately displaced rib fractures with overlying sq emphysema of right chest wall, 2.4 cm rounded abnormality to the left of the ascending thoracid aorta which is concerning for possible aneurysm   Micro Data:  UA 12/12 >> many bacteria, nitrite +, small leukocytes, 21-50 WBC  Antimicrobials:  Levaquin 12/13 >>  (stop date in for 5 days completion) Flagyl 12/13   Interim history/subjective:   remains on precedex 0.3 and dilaudid gtt at 2-4 mg/hr.   RN states patient wakes up panicked and gets combative.   -3L diuresis from yesterday  Objective   Blood pressure 140/71, pulse 69, temperature 98.3 F (36.8 C), temperature source Oral, resp. rate 16, height 6\' 2"  (1.88 m), weight 85.2 kg, SpO2 95 %.    Vent Mode: PSV;CPAP FiO2 (%):  [40 %] 40 % Set Rate:  [16 bmp] 16 bmp Vt Set:  [650 mL] 650 mL PEEP:  [5 cmH20] 5 cmH20 Pressure Support:  [8 cmH20] 8 cmH20 Plateau Pressure:  [14 cmH20-16 cmH20] 15 cmH20   Intake/Output Summary (Last 24 hours) at 05/22/2018 0933 Last data filed at 05/22/2018 0700 Gross per 24 hour  Intake 1379.32 ml  Output 4575 ml  Net -3195.68 ml   Filed Weights   05/20/18 0411 05/21/18 0448 05/22/18 0340  Weight: 80.2 kg 84.4 kg 85.2 kg   General:  Critically ill older male in NAD on MV HEENT: MM pink/moist, ETT 25, OGT, pupils 3/reactive Neuro: Awakens to voice and initially thrashed around but was able to redirect and then would f/c in all extremitites CV: rr, SR to SB, distant HS, +1 pulses PULM: even/non-labored on MV, lungs bilaterally clear, slightly diminished right base GY:JEHU, non-tender, bs active  Extremities: warm/dry, generalized/ sacral edema  Skin: no rashes   LABS    PULMONARY Recent Labs  Lab 05/18/18 0413 05/18/18 0914 05/19/18 0451  PHART 7.452* 7.384 7.470*  PCO2ART 36.3 35.5 28.4*  PO2ART 87.9 266.0* 111.0*  HCO3 25.0 21.2 20.7  TCO2  --  22 22  O2SAT 97.1 100.0 99.0    CBC Recent Labs  Lab 05/20/18 0358 05/21/18 0438 05/22/18 0502  HGB 11.5* 11.2* 10.9*  HCT 35.9* 35.3* 33.5*  WBC 10.2 8.1 8.3  PLT 354 346 PLATELET CLUMPS NOTED ON SMEAR, UNABLE TO ESTIMATE    COAGULATION No results for input(s): INR in the last 168 hours.  CARDIAC  No results for input(s): TROPONINI in the last 168 hours. No results for input(s): PROBNP in the last 168 hours.   CHEMISTRY Recent Labs  Lab 05/18/18 0721 05/19/18 0915 05/20/18 0358 05/20/18 1626 05/21/18 0438  05/21/18 2306 05/22/18 0502  NA 135 137 138  --  139 140  --   K 4.2 3.2* 4.0  --  4.3 4.5  --   CL 101 110 108  --  110 107  --   CO2 20* 20* 20*  --  21* 24  --   GLUCOSE 145* 136* 123*  --  107* 97  --   BUN 6* 10 14  --  13 17  --   CREATININE 0.51* 0.55* 0.55*  --  0.61 0.60*  --   CALCIUM 8.4* 7.4* 7.5*  --  7.6* 7.9*  --   MG  --  1.7 1.6* 1.6*  --   --  1.8  PHOS  --  2.5 2.6 2.9  --   --  3.9   Estimated Creatinine Clearance: 107 mL/min (A) (by C-G formula based on SCr of 0.6 mg/dL (L)).   LIVER Recent Labs  Lab 05/22/18 0502  AST 36  ALT 15  ALKPHOS 45  BILITOT 1.0  PROT 4.7*  ALBUMIN 1.5*     INFECTIOUS Recent Labs  Lab 05/21/18 2306  LATICACIDVEN 1.1     ENDOCRINE CBG (last 3)  Recent Labs    05/22/18 0009 05/22/18 0357 05/22/18 0839  GLUCAP 106* 99 94    IMAGING x48h  - image(s) personally visualized  -   highlighted in bold Dg Chest Port 1 View  Result Date: 05/22/2018 CLINICAL DATA:  Respiratory failure EXAM: PORTABLE CHEST 1 VIEW COMPARISON:  05/21/2018 FINDINGS: Support devices are stable. Cardiomegaly. Bilateral airspace disease and layering effusions, right greater than left. This could reflect asymmetric edema or infection. No significant change since prior study. IMPRESSION: Bilateral airspace disease and layering effusions, right greater than left. This could reflect asymmetric edema or infection. No change. Electronically Signed   By: Rolm Baptise M.D.   On: 05/22/2018 08:42   Dg Chest Port 1 View  Result Date: 05/21/2018 CLINICAL DATA:  Ventilator support EXAM: PORTABLE CHEST 1 VIEW COMPARISON:  05/19/2018 FINDINGS: Endotracheal tube tip is 3 cm above the carina. Nasogastric tube enters the abdomen. Right internal jugular central line tip is in the SVC 2 cm above the right atrium. Bilateral lower lobe volume loss persists with pleural fluid on the right. Findings are slightly worsened since 2 days ago. IMPRESSION: Worsening lower lobe  volume loss and effusions right larger than left. Electronically Signed   By: Nelson Chimes M.D.   On: 05/21/2018 07:26   Resolved Hospital Problem list      Assessment & Plan:   Mechanical Fall with Multiple Rib Fractures SEvere Pain with agitated delirium from pain and etoh withdrawal and hx of CVA P: Dilaudid gtt Gabapentin - low threshold to dc lidoderm patch to area precedex gtt Start oxy IR 5mg  q 4 to wean dilaudid gtt and klonopin 0.5 BID to help wean precedex RASS goal 0 to -1  Continue daily ASA/ thiamine/ folate  Acute Hypoxic Respiratory Failure  -in setting multiple rib fractures, atelectasis, pneumothorax,   -? Layering effusion vs hemithorax given multiple rib fx P: Full MV support Daily SBT Intermittent CXR  Will assess right lung with bedside US   Pneumothorax  P: Resolved, monitor   Shock   -suspect component sepsis with UTI and sedation effects; since resolved P: Goal MAP >65   Fever -UTI; concern for possible aspiration PNA P: Follow cultures  Finish levaquin for 5 day course Monitor fever curve/ WBC trend  UTI  P: Continue levaquin - monitor QTC with EKG  Hyponatremia - resolved  -likely beer potomania  P: Trend   Hypomagnesemia  - repletd 05/20/18 P: Additional mag 2 gm now  Volume overload - remains net + 10 L - good diuresis with lasix, -3L / 24 hours P:  Continue lasix 80mg  q 8hr  Trend renal function/ electrolytes   Best practice:  Diet: TF  Pain/Anxiety/Delirium protocol (if indicated): in place  VAP protocol (if indicated): in place  DVT prophylaxis: scd's  GI prophylaxis: pepcid  Glucose control: n/a  Mobility: bedrest  Code Status: Full code  Family Communication: Daughter updated at bedside 05/20/18. None at bedside 05/21/18 or 12/17 Disposition: ICU   CCT 35 mins  Kennieth Rad, AGACNP-BC [facility] Pgr: [phone] or if no answer [phone] 05/22/2018, 9:57 AM

## 2018-05-22 NOTE — Progress Notes (Signed)
eLink Physician-Brief Progress Note Patient Name: [name] Pace DOB: 03-Jul-1950 MRN: [id number]   Date of Service  05/22/2018  HPI/Events of Note  Pt remains intubated with restraints in place.    eICU Interventions  Renewed restraints per RN request.     Intervention Category Minor Interventions: Other:  Elsie Lincoln 05/22/2018, 9:37 PM

## 2018-05-22 NOTE — Progress Notes (Signed)
PT Cancellation Note  Patient Details Name: DEWAYNE SEVERE MRN: [id number] DOB: [birth year]    Cancelled Treatment:    Reason Eval/Treat Not Completed: Medical issues which prohibited therapy.  Pt is remains intubated.  Will check back for medical appropriateness.   Duncan Dull 05/22/2018, 7:38 AM Alben Deeds, PT DPT  Board Certified Neurologic Specialist Acute Rehabilitation Services Pager [phone] Office [phone]

## 2018-05-22 NOTE — Progress Notes (Signed)
OT Cancellation Note  Patient Details Name: [name] Pace MRN: [id number] DOB: [birth year]   Cancelled Treatment:    Reason Eval/Treat Not Completed: Medical issues which prohibited therapy.  Pt is now intubated.  Will check back for medical appropriateness.  Lucille Passy, OTR/L Acute Rehabilitation Services Pager [phone] Office [phone]   Lucille Passy M 05/22/2018, 5:30 AM

## 2018-05-23 ENCOUNTER — Inpatient Hospital Stay (HOSPITAL_COMMUNITY): Payer: Medicare Other

## 2018-05-23 LAB — BODY FLUID CELL COUNT WITH DIFFERENTIAL
Eos, Fluid: 8 %
Lymphs, Fluid: 55 %
Monocyte-Macrophage-Serous Fluid: 12 % — ABNORMAL LOW (ref 50–90)
Neutrophil Count, Fluid: 25 % (ref 0–25)
Total Nucleated Cell Count, Fluid: 261 cu mm (ref 0–1000)

## 2018-05-23 LAB — ACID FAST SMEAR (AFB)

## 2018-05-23 LAB — CBC WITH DIFFERENTIAL/PLATELET
Abs Immature Granulocytes: 0.07 10*3/uL (ref 0.00–0.07)
Basophils Absolute: 0.1 10*3/uL (ref 0.0–0.1)
Basophils Relative: 1 %
Eosinophils Absolute: 0.2 10*3/uL (ref 0.0–0.5)
Eosinophils Relative: 2 %
HCT: 31.8 % — ABNORMAL LOW (ref 39.0–52.0)
Hemoglobin: 10.4 g/dL — ABNORMAL LOW (ref 13.0–17.0)
Immature Granulocytes: 1 %
Lymphocytes Relative: 18 %
Lymphs Abs: 1.3 10*3/uL (ref 0.7–4.0)
MCH: 35.6 pg — ABNORMAL HIGH (ref 26.0–34.0)
MCHC: 32.7 g/dL (ref 30.0–36.0)
MCV: 108.9 fL — ABNORMAL HIGH (ref 80.0–100.0)
MONOS PCT: 11 %
Monocytes Absolute: 0.8 10*3/uL (ref 0.1–1.0)
Neutro Abs: 4.9 10*3/uL (ref 1.7–7.7)
Neutrophils Relative %: 67 %
Platelets: 401 10*3/uL — ABNORMAL HIGH (ref 150–400)
RBC: 2.92 MIL/uL — ABNORMAL LOW (ref 4.22–5.81)
RDW: 13.2 % (ref 11.5–15.5)
WBC: 7.2 10*3/uL (ref 4.0–10.5)
nRBC: 0 % (ref 0.0–0.2)

## 2018-05-23 LAB — AMYLASE, PLEURAL OR PERITONEAL FLUID: Amylase, Fluid: 30 U/L

## 2018-05-23 LAB — GRAM STAIN

## 2018-05-23 LAB — POCT I-STAT 3, ART BLOOD GAS (G3+)
Acid-Base Excess: 6 mmol/L — ABNORMAL HIGH (ref 0.0–2.0)
BICARBONATE: 30.2 mmol/L — AB (ref 20.0–28.0)
O2 Saturation: 92 %
Patient temperature: 98.6
TCO2: 31 mmol/L (ref 22–32)
pCO2 arterial: 43.5 mmHg (ref 32.0–48.0)
pH, Arterial: 7.449 (ref 7.350–7.450)
pO2, Arterial: 61 mmHg — ABNORMAL LOW (ref 83.0–108.0)

## 2018-05-23 LAB — GLUCOSE, CAPILLARY
GLUCOSE-CAPILLARY: 84 mg/dL (ref 70–99)
Glucose-Capillary: 109 mg/dL — ABNORMAL HIGH (ref 70–99)
Glucose-Capillary: 110 mg/dL — ABNORMAL HIGH (ref 70–99)
Glucose-Capillary: 115 mg/dL — ABNORMAL HIGH (ref 70–99)
Glucose-Capillary: 122 mg/dL — ABNORMAL HIGH (ref 70–99)
Glucose-Capillary: 93 mg/dL (ref 70–99)
Glucose-Capillary: 99 mg/dL (ref 70–99)

## 2018-05-23 LAB — MAGNESIUM: Magnesium: 2 mg/dL (ref 1.7–2.4)

## 2018-05-23 LAB — RENAL FUNCTION PANEL
Albumin: 1.6 g/dL — ABNORMAL LOW (ref 3.5–5.0)
Anion gap: 8 (ref 5–15)
BUN: 21 mg/dL (ref 8–23)
CHLORIDE: 101 mmol/L (ref 98–111)
CO2: 28 mmol/L (ref 22–32)
Calcium: 8.1 mg/dL — ABNORMAL LOW (ref 8.9–10.3)
Creatinine, Ser: 0.71 mg/dL (ref 0.61–1.24)
GFR calc Af Amer: >60 mL/min (ref >60)
GFR calc non Af Amer: >60 mL/min (ref >60)
Glucose, Bld: 108 mg/dL — ABNORMAL HIGH (ref 70–99)
POTASSIUM: 4.6 mmol/L (ref 3.5–5.1)
Phosphorus: 5.7 mg/dL — ABNORMAL HIGH (ref 2.5–4.6)
Sodium: 137 mmol/L (ref 135–145)

## 2018-05-23 LAB — ACID FAST SMEAR (AFB, MYCOBACTERIA): Acid Fast Smear: NEGATIVE

## 2018-05-23 LAB — CULTURE, BLOOD (ROUTINE X 2)
Culture: NO GROWTH
Culture: NO GROWTH
Special Requests: ADEQUATE

## 2018-05-23 LAB — ALBUMIN, PLEURAL OR PERITONEAL FLUID: Albumin, Fluid: 1.1 g/dL

## 2018-05-23 LAB — LACTATE DEHYDROGENASE, PLEURAL OR PERITONEAL FLUID: LD, Fluid: 213 U/L — ABNORMAL HIGH (ref 3–23)

## 2018-05-23 LAB — GLUCOSE, PLEURAL OR PERITONEAL FLUID: Glucose, Fluid: 115 mg/dL

## 2018-05-23 LAB — PROTEIN, PLEURAL OR PERITONEAL FLUID: Total protein, fluid: <3 g/dL

## 2018-05-23 MED ORDER — DEXMEDETOMIDINE HCL IN NACL 200 MCG/50ML IV SOLN
0.4000 ug/kg/h | INTRAVENOUS | Status: DC
  Administered 2018-05-23: 0.4 ug/kg/h via INTRAVENOUS
  Filled 2018-05-23: qty 50

## 2018-05-23 MED ORDER — ORAL CARE MOUTH RINSE
15.0000 mL | Freq: Two times a day (BID) | OROMUCOSAL | Status: DC
  Administered 2018-05-23 – 2018-06-01 (×12): 15 mL via OROMUCOSAL

## 2018-05-23 MED ORDER — MORPHINE SULFATE (PF) 2 MG/ML IV SOLN
1.0000 mg | INTRAVENOUS | Status: DC | PRN
  Administered 2018-05-23 – 2018-05-24 (×4): 1 mg via INTRAVENOUS
  Filled 2018-05-23 (×4): qty 1

## 2018-05-23 MED ORDER — IPRATROPIUM-ALBUTEROL 0.5-2.5 (3) MG/3ML IN SOLN
3.0000 mL | Freq: Four times a day (QID) | RESPIRATORY_TRACT | Status: DC
  Administered 2018-05-23 – 2018-05-26 (×12): 3 mL via RESPIRATORY_TRACT
  Filled 2018-05-23 (×12): qty 3

## 2018-05-23 MED ORDER — MIDAZOLAM HCL 2 MG/2ML IJ SOLN
2.0000 mg | INTRAMUSCULAR | Status: DC | PRN
  Administered 2018-05-23: 2 mg via INTRAVENOUS
  Filled 2018-05-23: qty 2

## 2018-05-23 MED ORDER — LIDOCAINE HCL (PF) 1 % IJ SOLN
INTRAMUSCULAR | Status: AC
  Administered 2018-05-23: 11:00:00
  Filled 2018-05-23: qty 30

## 2018-05-23 NOTE — Progress Notes (Signed)
Patient remains on 6L [place]. No distress noted. BIPAP on standy by.

## 2018-05-23 NOTE — Procedures (Signed)
PROCEDURE SUMMARY:  Successful US guided right thoracentesis. Yielded 450 mL of clear red fluid. Patient tolerated procedure well. No immediate complications. EBL = trace  Specimen was sent for labs.  Post procedure chest X-ray pending.  Cross Jorge S Uilani Sanville PA-C 05/23/2018 8:58 AM

## 2018-05-23 NOTE — Progress Notes (Signed)
NAME:  [name] Pace, MRN:  [id number], DOB:  [birth year], LOS: 9 ADMISSION DATE:  05/13/2018, CONSULTATION DATE:  05/20/18 REFERRING MD:  Dr. Earnest Conroy, CHIEF COMPLAINT:  Tachypnea    Brief History   67 y/o M, smoker, admitted 12/8 after a fall with right sided chest pain.  Work up revealed multiple right sided rib fractures and small pneumothorax which did not require a chest tube.  PCCM called to see the patient  12/13 for tachypnea and increasing O2 needs. At that time, CXR showed resolution of pneumothorax.  ABG 7.45 / 36 / 88.     Past Medical History  Breast cancer  CVA Prostate cancer  Anxiety  ETOH abuse  HLD   Significant Hospital Events   12/08 Admit after fall with rib fractures, small PTX 12/09 CIWA protocol for ETOH withdrawal 12/13 Rapid response PCCM consulted  12/14 Precedex changed to Fentanyl for pain control 12/15- RN reports aline non-functional.  Levophed at 8 mcg, fentanyl at 441mcg's. 12/16-  precedex started this morning due to agitation despite dilaudid gtt => helping / + 14L since admit. Off levophed. On TF. Getting 200cc/h saline stopped/ diuresis started  12/17 -  remains on precedex 0.3 and dilaudid gtt at 2-4 mg/hr.  RN states patient wakes up panicked and gets combative.   -3L diuresis from yesterday   Consults:  PCCM 12/13   Procedures:    Significant Diagnostic Tests:  CT Chest 12/8 >> multiple mildly displaced right rib fractures 5-11th ribs, minimal right apical pneumothorax, rounded structure in the anterior mediastinum, likely a saccular aneurysm CT Chest 12/14 >> no PTX, moderate bilateral pleural effusions with adjacent atelectasis, multiple moderately displaced rib fractures with overlying sq emphysema of right chest wall, 2.4 cm rounded abnormality to the left of the ascending thoracid aorta which is concerning for possible aneurysm   Micro Data:  UA 12/12 >> many bacteria, nitrite +, small leukocytes, 21-50 WBC  Antimicrobials:    Levaquin 12/13 >>  (stop date in for 5 days completion) Flagyl 12/13   Interim history/subjective:    12/18 - s/p Rt thora 464mL clear fluid. Exudate by LDH with 55% lympos and 25% polys and 8% eos (cytology pending)  -> self extubated - > very weak voice and diffusely weak, 5L [place] and pulse ox 86%, very weak cough. Oriented (off dilaudid gtt) and wants to go home  Objective   Blood pressure (!) 116/98, pulse 82, temperature (!) 97.4 F (36.3 C), temperature source Oral, resp. rate 14, height 6\' 2"  (1.88 m), weight 81 kg, SpO2 95 %.    Vent Mode: PRVC FiO2 (%):  [40 %] 40 % Set Rate:  [16 bmp] 16 bmp Vt Set:  [650 mL] 650 mL PEEP:  [5 cmH20] 5 cmH20 Pressure Support:  [5 cmH20] 5 cmH20 Plateau Pressure:  [15 cmH20-17 cmH20] 15 cmH20   Intake/Output Summary (Last 24 hours) at 05/23/2018 1134 Last data filed at 05/23/2018 1100 Gross per 24 hour  Intake 2432.96 ml  Output 4190 ml  Net -1757.04 ml   Filed Weights   05/21/18 0448 05/22/18 0340 05/23/18 0333  Weight: 84.4 kg 85.2 kg 81 kg  General Appearance:  Deconditioned significantly,  Head:  Normocephalic, without obvious abnormality, atraumatic Eyes:  PERRL - yes, conjunctiva/corneas - clear     Ears:  Normal external ear canals, both ears Nose:  G tube - no . [place] + Throat:  ETT TUBE - no , OG tube - no. WEAK (VERY) VOICE Neck:  Supple,  No enlargement/tenderness/nodules Lungs: Coarse, distant breath sounds, junky weak cough + Heart:  S1 and S2 normal, no murmur, CVP - no.  Pressors - no Abdomen:  Soft, no masses, no organomegaly Genitalia / Rectal:  Not done Extremities:  Extremities- intact Skin:  ntact in exposed areas  Both hands and feet - "mottling" but chronic looking Neurologic:  Sedation - none -> RASS - +1 . Moves all 4s - yes but power only 3/5 diffusely weak. CAM-ICU - neg . Orientation - x3+      LABS    PULMONARY Recent Labs  Lab 05/18/18 0413 05/18/18 0914 05/19/18 0451  PHART 7.452* 7.384  7.470*  PCO2ART 36.3 35.5 28.4*  PO2ART 87.9 266.0* 111.0*  HCO3 25.0 21.2 20.7  TCO2  --  22 22  O2SAT 97.1 100.0 99.0    CBC Recent Labs  Lab 05/21/18 0438 05/22/18 0502 05/23/18 0339  HGB 11.2* 10.9* 10.4*  HCT 35.3* 33.5* 31.8*  WBC 8.1 8.3 7.2  PLT 346 PLATELET CLUMPS NOTED ON SMEAR, UNABLE TO ESTIMATE 401*    COAGULATION No results for input(s): INR in the last 168 hours.  CARDIAC  No results for input(s): TROPONINI in the last 168 hours. No results for input(s): PROBNP in the last 168 hours.   CHEMISTRY Recent Labs  Lab 05/19/18 0915 05/20/18 0358 05/20/18 1626 05/21/18 0438 05/21/18 2306 05/22/18 0502 05/23/18 0339 05/23/18 0340  NA 137 138  --  139 140  --   --  137  K 3.2* 4.0  --  4.3 4.5  --   --  4.6  CL 110 108  --  110 107  --   --  101  CO2 20* 20*  --  21* 24  --   --  28  GLUCOSE 136* 123*  --  107* 97  --   --  108*  BUN 10 14  --  13 17  --   --  21  CREATININE 0.55* 0.55*  --  0.61 0.60*  --   --  0.71  CALCIUM 7.4* 7.5*  --  7.6* 7.9*  --   --  8.1*  MG 1.7 1.6* 1.6*  --   --  1.8 2.0  --   PHOS 2.5 2.6 2.9  --   --  3.9  --  5.7*   Estimated Creatinine Clearance: 105.5 mL/min (by C-G formula based on SCr of 0.71 mg/dL).   LIVER Recent Labs  Lab 05/22/18 0502 05/23/18 0340  AST 36  --   ALT 15  --   ALKPHOS 45  --   BILITOT 1.0  --   PROT 4.7*  --   ALBUMIN 1.5* 1.6*     INFECTIOUS Recent Labs  Lab 05/21/18 2306  LATICACIDVEN 1.1     ENDOCRINE CBG (last 3)  Recent Labs    05/23/18 0022 05/23/18 0332 05/23/18 0818  GLUCAP 115* 99 110*    IMAGING x48h  - image(s) personally visualized  -   highlighted in bold Dg Chest 1 View  Result Date: 05/23/2018 CLINICAL DATA:  Status post right-sided thoracentesis with removal of 450 cc of fluid. EXAM: CHEST  1 VIEW COMPARISON:  Portable chest x-ray of May 22, 2018 FINDINGS: The volume of pleural fluid on the right has decreased somewhat. There is no postprocedure  pneumothorax. There is no mediastinal shift. The left lung exhibits medial basal density compatible with atelectasis. The heart and pulmonary vascularity are normal. There are post CABG  changes. The endotracheal tube tip projects approximately 3.4 cm above the carina. The esophagogastric tube tip projects below the inferior margin of the image. The right internal jugular venous catheter tip projects over the midportion of the SVC. IMPRESSION: No postprocedure complication following right-sided thoracentesis. Persistent bibasilar atelectasis. Small right pleural effusion. The support tubes are in reasonable position. Electronically Signed   By: David  Martinique M.D.   On: 05/23/2018 09:31   Dg Chest Port 1 View  Result Date: 05/22/2018 CLINICAL DATA:  Respiratory failure EXAM: PORTABLE CHEST 1 VIEW COMPARISON:  05/21/2018 FINDINGS: Support devices are stable. Cardiomegaly. Bilateral airspace disease and layering effusions, right greater than left. This could reflect asymmetric edema or infection. No significant change since prior study. IMPRESSION: Bilateral airspace disease and layering effusions, right greater than left. This could reflect asymmetric edema or infection. No change. Electronically Signed   By: Rolm Baptise M.D.   On: 05/22/2018 08:42   US Thoracentesis Asp Pleural Space W/img Guide  Result Date: 05/23/2018 INDICATION: Recent fall with multiple right rib fractures. Right pleural effusion. Failure to wean from mechanical ventilator. Request for diagnostic and therapeutic thoracentesis. EXAM: ULTRASOUND GUIDED RIGHT THORACENTESIS MEDICATIONS: 1% lidocaine 10 mL COMPLICATIONS: None immediate. PROCEDURE: An ultrasound guided thoracentesis was thoroughly discussed with the patient and questions answered. The benefits, risks, alternatives and complications were also discussed. The patient understands and wishes to proceed with the procedure. Written consent was obtained. Ultrasound was performed to  localize and mark an adequate pocket of fluid in the right chest. The area was then prepped and draped in the normal sterile fashion. 1% Lidocaine was used for local anesthesia. Under ultrasound guidance a 6 Fr Safe-T-Centesis catheter was introduced. Thoracentesis was performed. The catheter was removed and a dressing applied. FINDINGS: A total of approximately 450 mL of clear red fluid was removed. Samples were sent to the laboratory as requested by the clinical team. IMPRESSION: Successful ultrasound guided right thoracentesis yielding 450 mL of pleural fluid. No pneumothorax on post-procedure chest x-ray. Read by: Gareth Eagle, PA-C Electronically Signed   By: Jacqulynn Cadet M.D.   On: 05/23/2018 10:18   Resolved Hospital Problem list      Assessment & Plan:   Mechanical Fall with Multiple Rib Fractures SEvere Pain with agitated delirium from pain and etoh withdrawal and hx of CVA  05/23/2018 - with self extubation no delirium. Cannot take po due to weak voice  P: Dc Dilaudid gtt Stop Gabapentin - low threshold to dc lidoderm patch to area If needed precedex Dc oxy IR and klonopin RASS goal 0 to -1 Continue daily ASA/ thiamine/ folate  Acute Hypoxic Respiratory Failure  -in setting multiple rib fractures, atelectasis, pneumothorax,   -? Layering effusion vs hemithorax given multiple rib fx   12/18  - s/p thora 12/18 - 450cc removed . Exudate. SElf extubated but very weak voice, 5L O2 need with pulse ox 86%, and severe deconditioning - places him high risk for reintubation  P: - [address] for pulse ox > 88% - check ABG and consider BiPAP if hypercarbic  - NPO  Pneumothorax  P: Resolved, monitor   Shock   -suspect component sepsis with UTI and sedation effects; since resolved P: Goal MAP >65   Fever/UTI v ASpn v both -UTI; concern for possible aspiration PNA. On levaquin since 05/18/18 P: Follow cultures  Finish levaquin for 5 day course - on 05/23/18 (QTC ok  05/22/18) Monitor fever curve/ WBC trend    Volume overload -  remains net +8L  (was +11L few days ago) - good diuresis with lasix, -3L / 24 hours as of 05/22/18 P:  Continue lasix 80mg  q 8hr  Trend renal function/ electrolytes   Best practice:  Diet: NPO Pain/Anxiety/Delirium protocol (if indicated): in place  VAP protocol (if indicated): in place  DVT prophylaxis: scd's  GI prophylaxis: pepcid  Glucose control: n/a  Mobility: bedrest  Code Status: Full code  Family Communication: Daughter updated at bedside 05/20/18. None at bedside 05/21/18 or 12/17 or 05/23/18 Disposition: ICU    ATTESTATION & SIGNATURE   The patient EDNA GROVER is critically ill with multiple organ systems failure and requires high complexity decision making for assessment and support, frequent evaluation and titration of therapies, application of advanced monitoring technologies and extensive interpretation of multiple databases.   Critical Care Time devoted to patient care services described in this note is  30  Minutes. This time reflects time of care of this signee Dr Brand Males. This critical care time does not reflect procedure time, or teaching time or supervisory time of PA/NP/Med student/Med Resident etc but could involve care discussion time     Dr. Brand Males, M.D., [facility].C.P Pulmonary and Critical Care Medicine Staff Physician [place] [facility] Pager: [phone], If no answer or between  15:00h - 7:00h: call [phone]  05/23/2018 12:03 PM

## 2018-05-23 NOTE — Progress Notes (Signed)
Called to room STAT d/t pt self extubated.  Sats 88% on room air, no distress noted, no stridor noted.  Pt was initially placed on NRB, then weaned to [place] 5 lpm.  Sats currently 100%.

## 2018-05-23 NOTE — Progress Notes (Signed)
Intermitted mild to moderate agitation  Will start precedex    SIGNATURE    Dr. Brand Males, M.D., F.C.C.P,  Pulmonary and Critical Care Medicine Staff Physician, [place] Director - Interstitial Lung Disease  Program  Pulmonary [facility] at [place], [place], [address]  Pager: [phone], If no answer or between  15:00h - 7:00h: call [phone] Telephone: [phone]  2:04 PM 05/23/2018

## 2018-05-23 NOTE — Progress Notes (Signed)
eLink Physician-Brief Progress Note Patient Name: ALEXANDERJAMES BERG DOB: 1951/04/25 MRN: [id number]   Date of Service  05/23/2018  HPI/Events of Note  Pt bradycardic while on precedex and dilaudid.  He has remained hemodynamically stable.  Pt is intubated.  eICU Interventions  D/C precedex.  Give versed bolus PRN if needed for agitation.     Intervention Category Major Interventions: Arrhythmia - evaluation and management  Elsie Lincoln 05/23/2018, 3:48 AM
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
## 2018-05-23 NOTE — Progress Notes (Signed)
RN called d/t pt having bedside procedure and she is giving sedation.  Pt placed back on full vent support for now d/t procedure.  RN at bedside= aware.

## 2018-05-23 NOTE — Progress Notes (Signed)
eLink Physician-Brief Progress Note Patient Name: [name] Pace DOB: [birth year] MRN: [id number]   Date of Service  05/23/2018  HPI/Events of Note  Complaining of pain due to rib fractures. Self extubated earlier today still on NPO status. Patient seen siting upright in bed not in acute distress.  eICU Interventions  Ordered morphine 1 mg q 6 prn severe pain Swallow eval in am and switch to PO meds     Intervention Category Intermediate Interventions: Pain - evaluation and management  [name] 05/23/2018, 8:45 PM

## 2018-05-24 DIAGNOSIS — J13 Pneumonia due to Streptococcus pneumoniae: Secondary | ICD-10-CM

## 2018-05-24 DIAGNOSIS — G9340 Encephalopathy, unspecified: Secondary | ICD-10-CM

## 2018-05-24 LAB — CBC WITH DIFFERENTIAL/PLATELET
Abs Immature Granulocytes: 0.08 10*3/uL — ABNORMAL HIGH (ref 0.00–0.07)
Basophils Absolute: 0.1 10*3/uL (ref 0.0–0.1)
Basophils Relative: 1 %
Eosinophils Absolute: 0.1 10*3/uL (ref 0.0–0.5)
Eosinophils Relative: 0 %
HCT: 33.6 % — ABNORMAL LOW (ref 39.0–52.0)
Hemoglobin: 11.5 g/dL — ABNORMAL LOW (ref 13.0–17.0)
Immature Granulocytes: 1 %
Lymphocytes Relative: 8 %
Lymphs Abs: 0.9 10*3/uL (ref 0.7–4.0)
MCH: 36.2 pg — ABNORMAL HIGH (ref 26.0–34.0)
MCHC: 34.2 g/dL (ref 30.0–36.0)
MCV: 105.7 fL — ABNORMAL HIGH (ref 80.0–100.0)
Monocytes Absolute: 1 10*3/uL (ref 0.1–1.0)
Monocytes Relative: 9 %
NEUTROS PCT: 81 %
Neutro Abs: 9.2 10*3/uL — ABNORMAL HIGH (ref 1.7–7.7)
PLATELETS: 473 10*3/uL — AB (ref 150–400)
RBC: 3.18 MIL/uL — ABNORMAL LOW (ref 4.22–5.81)
RDW: 13 % (ref 11.5–15.5)
WBC: 11.2 10*3/uL — ABNORMAL HIGH (ref 4.0–10.5)
nRBC: 0 % (ref 0.0–0.2)

## 2018-05-24 LAB — GLUCOSE, CAPILLARY
GLUCOSE-CAPILLARY: 97 mg/dL (ref 70–99)
Glucose-Capillary: 92 mg/dL (ref 70–99)
Glucose-Capillary: 110 mg/dL — ABNORMAL HIGH (ref 70–99)
Glucose-Capillary: 114 mg/dL — ABNORMAL HIGH (ref 70–99)
Glucose-Capillary: 91 mg/dL (ref 70–99)

## 2018-05-24 LAB — STREP PNEUMONIAE URINARY ANTIGEN: Strep Pneumo Urinary Antigen: NEGATIVE

## 2018-05-24 LAB — MAGNESIUM: Magnesium: 1.9 mg/dL (ref 1.7–2.4)

## 2018-05-24 LAB — PHOSPHORUS: PHOSPHORUS: 4 mg/dL (ref 2.5–4.6)

## 2018-05-24 MED ORDER — QUETIAPINE FUMARATE 50 MG PO TABS
50.0000 mg | ORAL_TABLET | Freq: Two times a day (BID) | ORAL | Status: DC
  Administered 2018-05-24 – 2018-05-26 (×5): 50 mg via ORAL
  Filled 2018-05-24 (×5): qty 1

## 2018-05-24 MED ORDER — MAGNESIUM SULFATE 2 GM/50ML IV SOLN
2.0000 g | Freq: Once | INTRAVENOUS | Status: AC
  Administered 2018-05-24: 2 g via INTRAVENOUS
  Filled 2018-05-24: qty 50

## 2018-05-24 MED ORDER — FAMOTIDINE 40 MG/5ML PO SUSR
20.0000 mg | Freq: Two times a day (BID) | ORAL | Status: DC
  Administered 2018-05-24 – 2018-05-26 (×4): 20 mg via ORAL
  Filled 2018-05-24 (×2): qty 2.5

## 2018-05-24 MED ORDER — HALOPERIDOL LACTATE 5 MG/ML IJ SOLN
5.0000 mg | INTRAMUSCULAR | Status: AC
  Administered 2018-05-24: 5 mg via INTRAVENOUS

## 2018-05-24 MED ORDER — FENTANYL CITRATE (PF) 100 MCG/2ML IJ SOLN
25.0000 ug | INTRAMUSCULAR | Status: DC | PRN
  Administered 2018-05-24 – 2018-05-25 (×3): 100 ug via INTRAVENOUS
  Administered 2018-05-26: 25 ug via INTRAVENOUS
  Administered 2018-05-26: 100 ug via INTRAVENOUS
  Administered 2018-05-26 – 2018-06-06 (×11): 50 ug via INTRAVENOUS
  Filled 2018-05-24 (×17): qty 2

## 2018-05-24 MED ORDER — ASPIRIN 325 MG PO TABS
325.0000 mg | ORAL_TABLET | Freq: Every day | ORAL | Status: DC
  Administered 2018-05-24 – 2018-05-26 (×2): 325 mg via ORAL
  Filled 2018-05-24: qty 1

## 2018-05-24 MED ORDER — FOLIC ACID 1 MG PO TABS
1.0000 mg | ORAL_TABLET | Freq: Every day | ORAL | Status: DC
  Administered 2018-05-24 – 2018-05-26 (×2): 1 mg via ORAL
  Filled 2018-05-24: qty 1

## 2018-05-24 MED ORDER — POTASSIUM CHLORIDE CRYS ER 20 MEQ PO TBCR
40.0000 meq | EXTENDED_RELEASE_TABLET | Freq: Two times a day (BID) | ORAL | Status: DC
  Administered 2018-05-24 – 2018-05-25 (×2): 40 meq via ORAL
  Filled 2018-05-24 (×2): qty 2

## 2018-05-24 MED ORDER — POTASSIUM CHLORIDE 20 MEQ/15ML (10%) PO SOLN
40.0000 meq | Freq: Two times a day (BID) | ORAL | Status: DC
  Filled 2018-05-24: qty 30

## 2018-05-24 MED ORDER — LEVOFLOXACIN 500 MG PO TABS
500.0000 mg | ORAL_TABLET | Freq: Every day | ORAL | Status: AC
  Administered 2018-05-24: 500 mg via ORAL
  Filled 2018-05-24 (×2): qty 1

## 2018-05-24 MED ORDER — DEXMEDETOMIDINE HCL IN NACL 200 MCG/50ML IV SOLN
0.4000 ug/kg/h | INTRAVENOUS | Status: DC
  Administered 2018-05-24: 0.4 ug/kg/h via INTRAVENOUS
  Administered 2018-05-24: 0.8 ug/kg/h via INTRAVENOUS
  Administered 2018-05-24: 0.7 ug/kg/h via INTRAVENOUS
  Administered 2018-05-25 (×2): 0.6 ug/kg/h via INTRAVENOUS
  Filled 2018-05-24 (×5): qty 50

## 2018-05-24 MED ORDER — LORAZEPAM 2 MG/ML IJ SOLN
1.0000 mg | INTRAMUSCULAR | Status: DC | PRN
  Administered 2018-05-24 – 2018-05-26 (×7): 2 mg via INTRAVENOUS
  Administered 2018-05-27: 1 mg via INTRAVENOUS
  Administered 2018-05-27 – 2018-05-30 (×8): 2 mg via INTRAVENOUS
  Administered 2018-05-30 (×2): 1 mg via INTRAVENOUS
  Administered 2018-05-30 – 2018-05-31 (×4): 2 mg via INTRAVENOUS
  Filled 2018-05-24 (×3): qty 1
  Filled 2018-05-24: qty 2
  Filled 2018-05-24 (×7): qty 1
  Filled 2018-05-24: qty 2
  Filled 2018-05-24 (×11): qty 1

## 2018-05-24 MED ORDER — HALOPERIDOL LACTATE 5 MG/ML IJ SOLN
5.0000 mg | Freq: Four times a day (QID) | INTRAMUSCULAR | Status: DC | PRN
  Administered 2018-05-24: 5 mg via INTRAVENOUS
  Filled 2018-05-24 (×2): qty 1

## 2018-05-24 NOTE — Progress Notes (Signed)
Patient stated he "needed to have a BM." Patient attempted scooting to the edge of the left hand side of the bed. Patient extremely weak and was a three person assist once up to bedside commode for peri-care.  Patient could not hold head up and did not understand commands, was very weak and was a quick turn and pivot onto [facility].  Will use bedpan from now on.  Cannot stand for longer than 30 seconds and was difficult to get back into bed and reposition.  Getting impatient with time for peri-care.
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
## 2018-05-24 NOTE — Progress Notes (Signed)
eLink Physician-Brief Progress Note Patient Name: [name] Pace DOB: [birth year] MRN: [id number]   Date of Service  05/24/2018  HPI/Events of Note  Pt noted to be bradycardic on precedex with HR in the 12s.   Pt given Seroquel and Ativan prior.  Pt is calm on this regimen.    eICU Interventions  Hold Precedex.  Can give ativan and haldol prn for agitation.     Intervention Category Major Interventions: Arrhythmia - evaluation and management  Elsie Lincoln 05/24/2018, 11:00 PM
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
## 2018-05-24 NOTE — Clinical Social Work Note (Signed)
CSW spoke with pt's daughter. Pt's daughter states she is a Audiological scientist. Pt's daughter states if pt becomes more oriented before d/c he is not going to agree with going to SNF. Pt's daughter also fears if we are able to get pt to SNF he will leave AMA. If the pt's daughter is left to make the decision,  she does not think she will agree to any of the [facility] facilities as through work she has seen the worst--pt's daughter to think about it. Pt's daughter also states pt wil never agree to stay with her either. CSW will continue to follow for medical stability and assess further.  [place], [place]

## 2018-05-24 NOTE — Progress Notes (Signed)
NAME:  [name] Pace, MRN:  [id number], DOB:  December 24, 1950, LOS: 29 ADMISSION DATE:  05/13/2018, CONSULTATION DATE:  05/20/18 REFERRING MD:  Dr. Earnest Conroy, CHIEF COMPLAINT:  Tachypnea    Brief History   67 y/o M, smoker, admitted 12/8 after a fall with right sided chest pain.  Work up revealed multiple right sided rib fractures and small pneumothorax which did not require a chest tube.  PCCM called to see the patient  12/13 for tachypnea and increasing O2 needs. At that time, CXR showed resolution of pneumothorax.  ABG 7.45 / 36 / 88.     Past Medical History  Breast cancer  CVA Prostate cancer  Anxiety  ETOH abuse  HLD   Significant Hospital Events   12/08 Admit after fall with rib fractures, small PTX 12/09 CIWA protocol for ETOH withdrawal 12/13 Rapid response PCCM consulted  12/14 Precedex changed to Fentanyl for pain control 12/15- RN reports aline non-functional.  Levophed at 8 mcg, fentanyl at 431mcg's. 12/16-  precedex started this morning due to agitation despite dilaudid gtt => helping / + 14L since admit. Off levophed. On TF. Getting 200cc/h saline stopped/ diuresis started  12/17 -  remains on precedex 0.3 and dilaudid gtt at 2-4 mg/hr.  RN states patient wakes up panicked and gets combative.   -3L diuresis from yesterday   12/18 - s/p Rt thora 466mL clear fluid. Exudate by LDH with 55% lympos and 25% polys and 8% eos (cytology pending)  -> self extubated - > very weak voice and diffusely weak, 5L [place] and pulse ox 86%, very weak cough. Oriented (off dilaudid gtt) and wants to go home   Consults:  PCCM 12/13   Procedures:    Significant Diagnostic Tests:  CT Chest 12/8 >> multiple mildly displaced right rib fractures 5-11th ribs, minimal right apical pneumothorax, rounded structure in the anterior mediastinum, likely a saccular aneurysm CT Chest 12/14 >> no PTX, moderate bilateral pleural effusions with adjacent atelectasis, multiple moderately displaced rib fractures  with overlying sq emphysema of right chest wall, 2.4 cm rounded abnormality to the left of the ascending thoracid aorta which is concerning for possible aneurysm   Micro Data:  UA 12/12 >> many bacteria, nitrite +, small leukocytes, 21-50 WBC Trach aspirate 12/13 - strep pneumo Urine strep 12/19 Urine leg 12/19 -   Antimicrobials:  Levaquin 12/13 >> 12/21 (7d course, pneumococcus) Flagyl 12/13  - ? Stop date  Interim history/subjective:   12/19 - remains extubated. Never needed percedex. But needs sitter. Asked for heroin v dilaudid opioids once (brother died of opioid use). Very deconditioned - high potential SNF patient but family concerned he will sign out AMA. Pulse ox 92% on 5LNC currently.   Noted: -  RN report: patient verbally abusive using terms such as "foreigner" to the sitter. CAM-ICU mildly positive for delirium but improved since yesterday. Sitter being replaced as patient gets more agitated in presence of sitter.   - Able to take po per RN  - also ongoing pain from rib fractures  - moprhine 1mg  prn not helping   Objective   Blood pressure (!) 149/70, pulse 78, temperature 97.9 F (36.6 C), temperature source Oral, resp. rate (!) 26, height 6\' 2"  (1.88 m), weight 75.6 kg, SpO2 99 %.        Intake/Output Summary (Last 24 hours) at 05/24/2018 0936 Last data filed at 05/24/2018 0700 Gross per 24 hour  Intake 257.85 ml  Output 5250 ml  Net -4992.15 ml  Filed Weights   05/22/18 0340 05/23/18 0333 05/24/18 0455  Weight: 85.2 kg 81 kg 75.6 kg    General Appearance:  emaciated Head:  Normocephalic, without obvious abnormality, atraumatic Eyes:  PERRL - yes, conjunctiva/corneas - clear     Ears:  Normal external ear canals, both ears Nose:  G tube - no Throat:  ETT TUBE - no , OG tube - no Neck:  Supple,  No enlargement/tenderness/nodules Lungs: Clear to auscultation bilaterally,  Heart:  S1 and S2 normal, no murmur, CVP - no.  Pressors - no Abdomen:  Soft, no  masses, no organomegaly Genitalia / Rectal:  Not done Extremities:  Extremities- intact Skin:  ntact in exposed areas . Neurologic:  Sedation - none gtt -> RASS - +1 to +2 . Moves all 4s - yes. CAM-ICU - mildly positive for delirium (could not get SAVEHARRT Finger squeeze fully correctly) . Orientation - partial   LABS    PULMONARY Recent Labs  Lab 05/18/18 0413 05/18/18 0914 05/19/18 0451 05/23/18 1159  PHART 7.452* 7.384 7.470* 7.449  PCO2ART 36.3 35.5 28.4* 43.5  PO2ART 87.9 266.0* 111.0* 61.0*  HCO3 25.0 21.2 20.7 30.2*  TCO2  --  22 22 31   O2SAT 97.1 100.0 99.0 92.0    CBC Recent Labs  Lab 05/22/18 0502 05/23/18 0339 05/24/18 0438  HGB 10.9* 10.4* 11.5*  HCT 33.5* 31.8* 33.6*  WBC 8.3 7.2 11.2*  PLT PLATELET CLUMPS NOTED ON SMEAR, UNABLE TO ESTIMATE 401* 473*    COAGULATION No results for input(s): INR in the last 168 hours.  CARDIAC  No results for input(s): TROPONINI in the last 168 hours. No results for input(s): PROBNP in the last 168 hours.   CHEMISTRY Recent Labs  Lab 05/19/18 0915 05/20/18 0358 05/20/18 1626 05/21/18 0438 05/21/18 2306 05/22/18 0502 05/23/18 0339 05/23/18 0340 05/24/18 0438  NA 137 138  --  139 140  --   --  137  --   K 3.2* 4.0  --  4.3 4.5  --   --  4.6  --   CL 110 108  --  110 107  --   --  101  --   CO2 20* 20*  --  21* 24  --   --  28  --   GLUCOSE 136* 123*  --  107* 97  --   --  108*  --   BUN 10 14  --  13 17  --   --  21  --   CREATININE 0.55* 0.55*  --  0.61 0.60*  --   --  0.71  --   CALCIUM 7.4* 7.5*  --  7.6* 7.9*  --   --  8.1*  --   MG 1.7 1.6* 1.6*  --   --  1.8 2.0  --  1.9  PHOS 2.5 2.6 2.9  --   --  3.9  --  5.7* 4.0   Estimated Creatinine Clearance: 98.4 mL/min (by C-G formula based on SCr of 0.71 mg/dL).   LIVER Recent Labs  Lab 05/22/18 0502 05/23/18 0340  AST 36  --   ALT 15  --   ALKPHOS 45  --   BILITOT 1.0  --   PROT 4.7*  --   ALBUMIN 1.5* 1.6*     INFECTIOUS Recent Labs    Lab 05/21/18 2306  LATICACIDVEN 1.1     ENDOCRINE CBG (last 3)  Recent Labs    05/23/18 2347 05/24/18 0356 05/24/18 0747  GLUCAP 84 92 110*    IMAGING x48h  - image(s) personally visualized  -   highlighted in bold Dg Chest 1 View  Result Date: 05/23/2018 CLINICAL DATA:  Status post right-sided thoracentesis with removal of 450 cc of fluid. EXAM: CHEST  1 VIEW COMPARISON:  Portable chest x-ray of May 22, 2018 FINDINGS: The volume of pleural fluid on the right has decreased somewhat. There is no postprocedure pneumothorax. There is no mediastinal shift. The left lung exhibits medial basal density compatible with atelectasis. The heart and pulmonary vascularity are normal. There are post CABG changes. The endotracheal tube tip projects approximately 3.4 cm above the carina. The esophagogastric tube tip projects below the inferior margin of the image. The right internal jugular venous catheter tip projects over the midportion of the SVC. IMPRESSION: No postprocedure complication following right-sided thoracentesis. Persistent bibasilar atelectasis. Small right pleural effusion. The support tubes are in reasonable position. Electronically Signed   By: David  Martinique M.D.   On: 05/23/2018 09:31   Dg Chest Port 1 View  Result Date: 05/23/2018 CLINICAL DATA:  Recent fall with chest pain and cough, initial encounter EXAM: PORTABLE CHEST 1 VIEW COMPARISON:  05/23/18 FINDINGS: Endotracheal tube and nasogastric catheter have been removed in the interval. Right jugular central line remains in place. Postsurgical changes are seen. Cardiac shadow is stable. Left subclavian arterial stent is seen. Multiple right-sided rib fractures are noted with slight increase in the degree of right-sided pleural effusion and underlying atelectatic changes. No pneumothorax is seen. IMPRESSION: Increasing right-sided effusion and atelectasis. Multiple right rib fractures. Electronically Signed   By: Inez Catalina  M.D.   On: 05/23/2018 12:24   US Thoracentesis Asp Pleural Space W/img Guide  Result Date: 05/23/2018 INDICATION: Recent fall with multiple right rib fractures. Right pleural effusion. Failure to wean from mechanical ventilator. Request for diagnostic and therapeutic thoracentesis. EXAM: ULTRASOUND GUIDED RIGHT THORACENTESIS MEDICATIONS: 1% lidocaine 10 mL COMPLICATIONS: None immediate. PROCEDURE: An ultrasound guided thoracentesis was thoroughly discussed with the patient and questions answered. The benefits, risks, alternatives and complications were also discussed. The patient understands and wishes to proceed with the procedure. Written consent was obtained. Ultrasound was performed to localize and mark an adequate pocket of fluid in the right chest. The area was then prepped and draped in the normal sterile fashion. 1% Lidocaine was used for local anesthesia. Under ultrasound guidance a 6 Fr Safe-T-Centesis catheter was introduced. Thoracentesis was performed. The catheter was removed and a dressing applied. FINDINGS: A total of approximately 450 mL of clear red fluid was removed. Samples were sent to the laboratory as requested by the clinical team. IMPRESSION: Successful ultrasound guided right thoracentesis yielding 450 mL of pleural fluid. No pneumothorax on post-procedure chest x-ray. Read by: Gareth Eagle, PA-C Electronically Signed   By: Jacqulynn Cadet M.D.   On: 05/23/2018 10:18   Resolved Hospital Problem list      Assessment & Plan:   Mechanical Fall with Multiple Rib Fractures SEvere Pain with agitated delirium from pain and etoh withdrawal and hx of CVA  05/24/2018 - remains extubated x 24h. Ongoiing pain per RN despite low dose morphine prn.  Confused but improved  P: Change morphine to fent prn with wider range (fent less deliriogenic) Add seroquel 50mg  bid (check QTc 05/25/18 on EKG) Continue to hold off  Gabapentin , oxy IR and klonopin - can add judiciously if  needed lidoderm patch to area to continue If needed precedex - not needed since extubation  Sitter to continue (patient counseled by MD on need for sitter and specific current sitter - he verbalized agreement) Continue daily ASA/ thiamine/ folate  Acute Hypoxic Respiratory Failure  -in setting multiple rib fractures, atelectasis, pneumothorax,   -? Layering effusion vs hemithorax given multiple rib fx   12/18  - s/p thora 12/18 - 450cc removed . Exudate. Cytology pending 12/19 - remains extubated. Still needing o2 - 5L [place] but looks beter. No hypercarbia on ABG yesterday   P: -  for pulse ox > 88% - pulm toilet - can check cxr prior to dc - await fluid cytology from 05/23/18    Pneumothorax  P: Resolved, monitor    Abnormal ECHO 05/21/18  - suggestive of mild cor pulmonale  - monitor = check EKG 05/25/18 for qtc interval  Shock   -suspect component sepsis and sedation effects; since resolved P: Goal MAP >65   Sepsis  - initial concern of aspiration v UTI. On 12/19 - trach culture +ve for strep pneumoniae  - afebrile as of 05/24/18 atleast since 05/18/18  P: Check urine leg, urine strep levaquin 2 more days from 05/24/18 to complete 7d course (check QTC on ekg 05/25/18)   Volume overload - +11.6L as of 05/21/18   - on 05/24/18 with lasix down tto Net +3.85L - BUn/Creat and K toleating  P:  Continue lasix 80mg  q 8hr  Trend renal function/ electrolytes Try without foley  ELectrolyte imbalance - low mag 12/19 - will replete - monitor K and lytes with Lsix  MSK  A: Physical deconditioning P: PT/OT   Best practice:  Diet: NPO Pain/Anxiety/Delirium protocol (if indicated): prn fent + added sitter 05/23/18 and adding seroquel 12/19 VAP protocol (if indicated): in place  DVT prophylaxis: scd's  GI prophylaxis: pepcid  Glucose control: n/a  Mobility: bedrest  Code Status: Full code  Family Communication: Daughter updated at bedside 05/20/18. None at  bedside 05/21/18 or 12/17 or 05/23/18 or 05/24/18 Disposition:  Move to progresive  SERVICE ; [place] primary from 05/25/18 and ccm off - d/w [facility]     SIGNATURE    Dr. Brand Males, M.D., F.C.C.P,  Pulmonary and Critical Care Medicine Staff Physician, [place] Director - Interstitial Lung Disease  Program  Pulmonary [place] at [place], [place], [address]  Pager: [phone], If no answer or between  15:00h - 7:00h: call [phone] Telephone: [phone]  10:59 AM 05/24/2018

## 2018-05-24 NOTE — Progress Notes (Signed)
Nutrition Follow-up  DOCUMENTATION CODES:   Non-severe (moderate) malnutrition in context of social or environmental circumstances  INTERVENTION:   Recommend considering insertion of Cortrak tube for initiation of nutrition support if unable to advance diet  Tube Feeding Recommendations:  Osmolite 1.5 @ 60 ml/hr Pro-Stat 30 mL daily Provides 107 g of protein, 2260 kcals and 1094 mL of free water   NUTRITION DIAGNOSIS:   Moderate Malnutrition related to social / environmental circumstances(EtOH and tobacco abuse) as evidenced by mild fat depletion, mild muscle depletion.  Continues  GOAL:   Patient will meet greater than or equal to 90% of their needs  Progressing  MONITOR:   TF tolerance, Vent status, Labs, Weight trends  REASON FOR ASSESSMENT:   Ventilator, Consult Enteral/tube feeding initiation and management  ASSESSMENT:   67 yo male admitted with right-sided rib fractures s/p fall, also wit small pneumothorax on 12/9. On 12/13, pt with decline requiring intubation. PMH includes CAD, stroke, EtOH and tobacco abuse, hyponatremia  12/09 Admit post fall with rib fractures, small PTX 12/09 CIWA protocol with EtOH withdrawal 12/13 Rapid response, transferred to ICU Intubated, TF initiated 12/18 Self-Extubated, TF d/c, NPO  NPO day 2 post self-extubation Agitated, requiring restraints, on precedex drip  Weight up from admission but trending back down. Current wt 75.6 kg. Net + 4 L   Labs: reviewed Meds: folic acid, lasix  Diet Order:   Diet Order            Diet NPO time specified Except for: Other (See Comments)  Diet effective ____              EDUCATION NEEDS:   Not appropriate for education at this time  Skin:  Skin Assessment: Skin Integrity Issues:(no pressure injury noted) Skin Integrity Issues:: Other (Comment) Other: burn marks on feet  Last BM:  12/19  Height:   Ht Readings from Last 1 Encounters:  05/18/18 6\' 2"  (1.88 m)     Weight:   Wt Readings from Last 1 Encounters:  05/24/18 75.6 kg    Ideal Body Weight:     BMI:  Body mass index is 21.4 kg/m.  Estimated Nutritional Needs:   Kcal:  9381-0175 kcals   Protein:  105-120 g   Fluid:  >/= 2 L   Kerman Passey MS, RD, LDN, CNSC [phone] Pager  [phone] Weekend/On-Call Pager

## 2018-05-24 NOTE — Progress Notes (Signed)
Bedside swallow eval done by this nurse. Person ate applesauce with no difficulty and took several sips of water through a straw with no cough afterwards. Mouth care was done prior to the bedside swallow.   Pt asked this nurse " Do you have any heroin, if not I'll take Dilaudid". This nurse later asked pt if he uses heroin. Pt states "absolutely not, I vowed to never use that bc it killed my brother".

## 2018-05-24 NOTE — Progress Notes (Signed)
New male sitter, [name] Pace at bedside.  Patient talking with her and at present is more calm. Updated [name] Pace that patient has pulled at central line and CBG's and temps are due at 1200 and 1600.  Verbalized understanding.

## 2018-05-24 NOTE — Progress Notes (Signed)
SLP Cancellation Note  Patient Details Name: [name] Pace MRN: [id number] DOB: 1950-10-23   Cancelled treatment:    Attempted to see pt for swallowing evaluation s/p 5 day intubation with self extubation.  On SLP arrival, pt very agitated and inappropriate for evaluation at this time. Will re-attempt as SLP schedule permits.  [place] 05/24/2018, 3:17 PM

## 2018-05-24 NOTE — Progress Notes (Signed)
Call from RN - patient agitated intermittently, needing restraints. Pulling his o2 off  Plan Check ekg for qtc Haldol 5mg  IV x 1 Start precedex Restraints     SIGNATURE    Dr. Brand Males, M.D., F.C.C.P,  Pulmonary and Critical Care Medicine Staff Physician, [place] Director - Interstitial Lung Disease  Program  Pulmonary [place] at [place], [place], [address]  Pager: [phone], If no answer or between  15:00h - 7:00h: call [phone] Telephone: [phone]  3:23 PM 05/24/2018

## 2018-05-24 NOTE — Evaluation (Signed)
Occupational Therapy Re-Evaluation Patient Details Name: [name] Pace MRN: [id number] DOB: 1950/11/24 Today's Date: 05/24/2018    History of Present Illness Patient is a 67 year old male with history of alcohol abuse, CAD, history of CVA presented to ED with severe right-sided chest wall pain after a fall on the night of 12/7. Pt with R rib fxs 5-11. Intubated 12/13, self extubated 12/18.    Clinical Impression   Pt reassessed s/p period of intubation. Pt with confusion, impulsivity and poor safety awareness. Complaints of pain in ribs, but able to perform bed mobility and sit at EOB with supervision. Stood with max assist from elevated bed, but unable to take side steps along EOB. Pt requires set up to self feed, otherwise mod to total assist for ADL. Will follow acutely. Recommending SNF, but per daughter, pt likely to refuse when cognition improves.    Follow Up Recommendations  SNF;Supervision/Assistance - 24 hour    Equipment Recommendations  3 in 1 bedside commode;Other (comment)(RW)    Recommendations for Other Services       Precautions / Restrictions Precautions Precautions: Fall Restrictions Weight Bearing Restrictions: No Other Position/Activity Restrictions: rib fractures      Mobility Bed Mobility Overal bed mobility: Needs Assistance Bed Mobility: Supine to Sit;Sit to Supine     Supine to sit: Supervision Sit to supine: Supervision   General bed mobility comments: supervision for safety  Transfers Overall transfer level: Needs assistance Equipment used: 1 person hand held assist Transfers: Sit to/from Stand Sit to Stand: Max assist         General transfer comment: max assist to rise and balance from elevated bed    Balance Overall balance assessment: Needs assistance;History of Falls Sitting-balance support: Feet supported Sitting balance-Leahy Scale: Fair       Standing balance-Leahy Scale: Poor                             ADL  either performed or assessed with clinical judgement   ADL Overall ADL's : Needs assistance/impaired Eating/Feeding: Set up;Sitting Eating/Feeding Details (indicate cue type and reason): ate applesauce Grooming: Sitting;Brushing hair;Maximal assistance   Upper Body Bathing: Moderate assistance;Sitting   Lower Body Bathing: Maximal assistance;Sit to/from stand;+2 for physical assistance   Upper Body Dressing : Moderate assistance;Sitting   Lower Body Dressing: Total assistance;+2 for physical assistance;Sit to/from stand   Toilet Transfer: +2 for physical assistance;Maximal assistance   Toileting- Clothing Manipulation and Hygiene: Total assistance;+2 for physical assistance;Sit to/from stand       Functional mobility during ADLs: (unable to step along EOB) General ADL Comments: pt with significant decline in function and cognition     Vision   Additional Comments: vision grossly intact     Perception     Praxis      Pertinent Vitals/Pain Pain Assessment: Faces Faces Pain Scale: Hurts even more Pain Location: ribs Pain Descriptors / Indicators: Grimacing;Moaning Pain Intervention(s): Monitored during session     Hand Dominance Right   Extremity/Trunk Assessment Upper Extremity Assessment Upper Extremity Assessment: Generalized weakness   Lower Extremity Assessment Lower Extremity Assessment: Defer to PT evaluation   Cervical / Trunk Assessment Cervical / Trunk Assessment: Kyphotic   Communication Communication Communication: No difficulties   Cognition Arousal/Alertness: Awake/alert Behavior During Therapy: Impulsive;Restless Overall Cognitive Status: Impaired/Different from baseline Area of Impairment: Orientation;Attention;Memory;Safety/judgement;Awareness;Problem solving                 Orientation Level: Disoriented  to;Situation;Time;Place Current Attention Level: Focused Memory: Decreased short-term memory   Safety/Judgement: Decreased  awareness of safety;Decreased awareness of deficits Awareness: Intellectual Problem Solving: Slow processing General Comments: pt attempting to get to EOB with rail up   General Comments       Exercises     Shoulder Instructions      Home Living Family/patient expects to be discharged to:: Private residence Living Arrangements: Spouse/significant other Available Help at Discharge: Available PRN/intermittently;Friend(s) Type of Home: Apartment Home Access: Stairs to enter [employer] of Steps: flight Entrance Stairs-Rails: Right Home Layout: One level     Bathroom Shower/Tub: Teacher, early years/pre: Standard     Home Equipment: Cane - single point   Additional Comments: per SW note, woman that lives with him encourages his alcoholism      Prior Functioning/Environment Level of Independence: Independent                 OT Problem List: Decreased strength;Decreased activity tolerance;Impaired balance (sitting and/or standing);Decreased cognition;Decreased safety awareness;Decreased knowledge of use of DME or AE;Decreased knowledge of precautions;Pain      OT Treatment/Interventions: Self-care/ADL training;DME and/or AE instruction;Patient/family education;Balance training;Therapeutic activities;Cognitive remediation/compensation    OT Goals(Current goals can be found in the care plan section) Acute Rehab OT Goals Patient Stated Goal: get pain meds OT Goal Formulation: With patient Time For Goal Achievement: 06/12/18 Potential to Achieve Goals: Fair  OT Frequency: Min 2X/week   Barriers to D/C: Decreased caregiver support          Co-evaluation              AM-PAC OT "6 Clicks" Daily Activity     Outcome Measure Help from another person eating meals?: A Little Help from another person taking care of personal grooming?: A Lot Help from another person toileting, which includes using toliet, bedpan, or urinal?: Total Help from  another person bathing (including washing, rinsing, drying)?: A Lot Help from another person to put on and taking off regular upper body clothing?: A Lot Help from another person to put on and taking off regular lower body clothing?: Total 6 Click Score: 11   End of Session Equipment Utilized During Treatment: Gait belt Nurse Communication: Mobility status  Activity Tolerance: Patient limited by pain Patient left: in bed;with call bell/phone within reach;with bed alarm set  OT Visit Diagnosis: Unsteadiness on feet (R26.81);Muscle weakness (generalized) (M62.81)                Time: 1000-1015 OT Time Calculation (min): 15 min Charges:  OT General Charges $OT Visit: 1 Visit OT Evaluation $OT Re-eval: 1 Re-eval  Nestor Lewandowsky, OTR/L Acute Rehabilitation Services Pager: [phone] Office: [phone]  Malka So 05/24/2018, 10:36 AM

## 2018-05-24 NOTE — Progress Notes (Signed)
Called pharmacy to change all med routes to PO and makes pills crushable to administer in applesauce.  Meds given crushed in applesauce w/o difficulty.  Patient able to take sips of juice via straw w/o difficulty or cough.  Potassium was mixed with water to forma slurry.

## 2018-05-24 NOTE — Progress Notes (Signed)
Bilateral soft wrist restraints placed and required x5 staff in room and security.  Oxygen increased to 98% on 6L once restraints placed.

## 2018-05-24 NOTE — Progress Notes (Signed)
Patient verbalized pain with SCD's on legs, becoming agitated and trying to take them off.  This nurse took them off due increased agitation with them on.  Patient is able to turn self in bed and has been restless all morning.

## 2018-05-24 NOTE — Progress Notes (Signed)
Patient refusing to wear oxygen and sats 69% on RA.  Dr. Chase Caller paged and he verbalized he would place new orders and that soft bilateral wrist restraints could be used.  Patient very combative and security and [place], Therapist, sports , and Meka, Naval architect of the unit at bedside.

## 2018-05-24 NOTE — Progress Notes (Signed)
Patient verbalized "feeling sick on my stomach"  Asked if he felt like throwing up and he said "yes".  Medicated per [facility] for nausea.

## 2018-05-24 NOTE — Progress Notes (Addendum)
Patient's daughter Curt Bears called and asked for update on her dad. Explained that he had required a 1:1 safety sitter all day and had been easy to reorient/redirect.  Advised that around 1500, patient became acutely severely agitated, combative, pulled off oxygen and would not let staff reapply.  Sats-68% on RA.  Advised that bilateral soft wrist restraints were placed.  Daughter was asked about how the fall occurred. She stated that "he was probably drunk and fell in his apartment. He has a trust fund from his grandparents that his Barbaraann Rondo in [place] manages and sends him money to live on.  However, he gambles and drinks all the money away. He has a long history of poly-substance abuse and ETOH abuse."  She states that she is concerned about "some dementia as well as encephalopathy."  Daughter is a paramedic and verbalized understanding.  Daughter expressed appreciation for the update and ongoing care.  She also advised that she has not seen her dad for one year until this admission.

## 2018-05-24 NOTE — Clinical Social Work Note (Signed)
Clinical Social Work Assessment  Patient Details  Name: [name] Pace MRN: [id number] Date of Birth: 1951-03-01  Date of referral:  05/24/18               Reason for consult:  Facility Placement                Permission sought to share information with:    Permission granted to share information::     Name::     Arboriculturist::     Relationship::  daughter  Contact Information:     Housing/Transportation Living arrangements for the past 2 months:  Single Family Home Source of Information:  Adult Children Patient Interpreter Needed:  None Criminal Activity/Legal Involvement Pertinent to Current Situation/Hospitalization:  No - Comment as needed Significant Relationships:  Adult Children, Significant Other Lives with:  Significant Other Do you feel safe going back to the place where you live?  (unable to answer not alert and oriented) Need for family participation in patient care:  Yes (Comment)  Care giving concerns:  Pt is only alert to self and place. Pt has a daughter and son on file.   Social Worker assessment / plan:  CSW spoke with pt's daughter via telephone. Pt's daughter is a Audiological scientist. Pt's daughters states she is not sure she wants her dad into any of the SNF in [place] as she has seen them from the inside out. Pt's daughter states if pt "comes to" he is going to refuse SNF. Pt's daugher states she also fears if we get him to SNF that he will just leave. Pt's daughter states pt has a woman who live with him who the other family members do not like. Pt's daughter states this women supports her fathers alcohol habits and participates as well. Pt's daughter states she will think about SNF but she doubts her father will agree to it when he becomes more alert and oriented.  Employment status:  Retired Nurse, adult PT Recommendations:  [place] / Referral to community resources:  [place]  Patient/Family's Response to care:  Pt's daughter verbalized understanding of CSW role and expressed appreciation for support. Pt's daughter denies any concern regarding pt care at this time.   Patient/Family's Understanding of and Emotional Response to Diagnosis, Current Treatment, and Prognosis:  Pt's daughter understands why pt would need SNF however, is not sure she would send him to any of the [facility] SNFs. Pt's daughter states her father is not going to agree when he becomes more oriented. Pt's daughter states she will think about it and CSW to follow up with daughter when pt becomes more stable.  Emotional Assessment Appearance:  Appears stated age Attitude/Demeanor/Rapport:  Unable to Assess Affect (typically observed):  Unable to Assess Orientation:  Oriented to Self, Oriented to Place Alcohol / Substance use:  Not Applicable Psych involvement (Current and /or in the community):  No (Comment)  Discharge Needs  Concerns to be addressed:  Basic Needs, Care Coordination, Patient refuses services Readmission within the last 30 days:  No Current discharge risk:  Dependent with Mobility Barriers to Discharge:  Continued Medical Work up   [employer], LCSW 05/24/2018, 9:50 AM

## 2018-05-25 ENCOUNTER — Inpatient Hospital Stay: Payer: Self-pay

## 2018-05-25 LAB — CBC WITH DIFFERENTIAL/PLATELET
Abs Immature Granulocytes: 0.05 10*3/uL (ref 0.00–0.07)
BASOS ABS: 0.1 10*3/uL (ref 0.0–0.1)
Basophils Relative: 1 %
Eosinophils Absolute: 0.2 10*3/uL (ref 0.0–0.5)
Eosinophils Relative: 2 %
HEMATOCRIT: 33.2 % — AB (ref 39.0–52.0)
Hemoglobin: 10.9 g/dL — ABNORMAL LOW (ref 13.0–17.0)
Immature Granulocytes: 1 %
LYMPHS ABS: 1.2 10*3/uL (ref 0.7–4.0)
LYMPHS PCT: 15 %
MCH: 34.4 pg — ABNORMAL HIGH (ref 26.0–34.0)
MCHC: 32.8 g/dL (ref 30.0–36.0)
MCV: 104.7 fL — ABNORMAL HIGH (ref 80.0–100.0)
Monocytes Absolute: 0.8 10*3/uL (ref 0.1–1.0)
Monocytes Relative: 11 %
Neutro Abs: 5.6 10*3/uL (ref 1.7–7.7)
Neutrophils Relative %: 70 %
Platelets: 455 10*3/uL — ABNORMAL HIGH (ref 150–400)
RBC: 3.17 MIL/uL — ABNORMAL LOW (ref 4.22–5.81)
RDW: 12.5 % (ref 11.5–15.5)
WBC: 8 10*3/uL (ref 4.0–10.5)
nRBC: 0 % (ref 0.0–0.2)

## 2018-05-25 LAB — BASIC METABOLIC PANEL
Anion gap: 17 — ABNORMAL HIGH (ref 5–15)
BUN: 14 mg/dL (ref 8–23)
CO2: 29 mmol/L (ref 22–32)
Calcium: 8.4 mg/dL — ABNORMAL LOW (ref 8.9–10.3)
Chloride: 95 mmol/L — ABNORMAL LOW (ref 98–111)
Creatinine, Ser: 0.9 mg/dL (ref 0.61–1.24)
GFR calc non Af Amer: >60 mL/min (ref >60)
Glucose, Bld: 99 mg/dL (ref 70–99)
Potassium: 3.3 mmol/L — ABNORMAL LOW (ref 3.5–5.1)
Sodium: 141 mmol/L (ref 135–145)

## 2018-05-25 LAB — LEGIONELLA PNEUMOPHILA SEROGP 1 UR AG: L. pneumophila Serogp 1 Ur Ag: NEGATIVE

## 2018-05-25 LAB — TROPONIN I
Troponin I: <0.03 ng/mL (ref <0.03)
Troponin I: <0.03 ng/mL (ref <0.03)
Troponin I: <0.03 ng/mL (ref <0.03)

## 2018-05-25 LAB — MAGNESIUM: Magnesium: 1.8 mg/dL (ref 1.7–2.4)

## 2018-05-25 LAB — PHOSPHORUS: Phosphorus: 3.7 mg/dL (ref 2.5–4.6)

## 2018-05-25 LAB — GLUCOSE, CAPILLARY: Glucose-Capillary: 82 mg/dL (ref 70–99)

## 2018-05-25 MED ORDER — MAGNESIUM SULFATE 2 GM/50ML IV SOLN
2.0000 g | Freq: Once | INTRAVENOUS | Status: AC
  Administered 2018-05-25: 2 g via INTRAVENOUS
  Filled 2018-05-25: qty 50

## 2018-05-25 MED ORDER — HALOPERIDOL LACTATE 5 MG/ML IJ SOLN
5.0000 mg | Freq: Four times a day (QID) | INTRAMUSCULAR | Status: DC
  Administered 2018-05-25 – 2018-05-29 (×11): 5 mg via INTRAVENOUS
  Filled 2018-05-25 (×14): qty 1

## 2018-05-25 MED ORDER — PRO-STAT SUGAR FREE PO LIQD: 30.0000 mL | Freq: Two times a day (BID) | ORAL | Status: DC

## 2018-05-25 MED ORDER — PRO-STAT SUGAR FREE PO LIQD
30.0000 mL | Freq: Every day | ORAL | Status: DC
  Administered 2018-05-26 – 2018-06-07 (×12): 30 mL
  Filled 2018-05-25 (×13): qty 30

## 2018-05-25 MED ORDER — FUROSEMIDE 10 MG/ML IJ SOLN
40.0000 mg | Freq: Two times a day (BID) | INTRAMUSCULAR | Status: DC
  Administered 2018-05-25 – 2018-05-26 (×3): 40 mg via INTRAVENOUS
  Filled 2018-05-25 (×3): qty 4

## 2018-05-25 MED ORDER — DEXMEDETOMIDINE HCL IN NACL 400 MCG/100ML IV SOLN
0.4000 ug/kg/h | INTRAVENOUS | Status: DC
  Administered 2018-05-25: 0.4 ug/kg/h via INTRAVENOUS
  Administered 2018-05-25: 0.7 ug/kg/h via INTRAVENOUS
  Administered 2018-05-26 (×2): 0.9 ug/kg/h via INTRAVENOUS
  Filled 2018-05-25 (×4): qty 100

## 2018-05-25 MED ORDER — OSMOLITE 1.5 CAL PO LIQD
1000.0000 mL | ORAL | Status: DC
  Administered 2018-05-25 – 2018-06-02 (×9): 1000 mL
  Filled 2018-05-25 (×18): qty 1000

## 2018-05-25 MED ORDER — VITAL HIGH PROTEIN PO LIQD: 1000.0000 mL | ORAL | Status: DC

## 2018-05-25 NOTE — Procedures (Signed)
Cortrak  Person Inserting Tube:  Maylon Peppers C, RD Tube Type:  Cortrak - 43 inches Tube Location:  Right nare Initial Placement:  Postpyloric Secured by: Bridle Technique Used to Measure Tube Placement:  Documented cm marking at nare/ corner of mouth Cortrak Secured At:  86 cm    Cortrak Tube Team Note:  Consult received to place a Cortrak feeding tube.   No x-ray is required. RN may begin using tube.   If the tube becomes dislodged please keep the tube and contact the Cortrak team at [email or web address] (password [email or web address]) for replacement.  If after hours and replacement cannot be delayed, place a NG tube and confirm placement with an abdominal x-ray.    [place], [place], [place] Pager [phone] After Hours Pager

## 2018-05-25 NOTE — Progress Notes (Signed)
Peripherally Inserted Central Catheter/Midline Placement  The IV Nurse has discussed with the patient and/or persons authorized to consent for the patient, the purpose of this procedure and the potential benefits and risks involved with this procedure.  The benefits include less needle sticks, lab draws from the catheter, and the patient may be discharged home with the catheter. Risks include, but not limited to, infection, bleeding, blood clot (thrombus formation), and puncture of an artery; nerve damage and irregular heartbeat and possibility to perform a PICC exchange if needed/ordered by physician.  Alternatives to this procedure were also discussed.  Bard Power PICC patient education guide, fact sheet on infection prevention and patient information card has been provided to patient /or left at bedside.    PICC/Midline Placement Documentation  PICC Double Lumen 05/25/18 PICC Right Brachial 40 cm 0 cm (Active)  Indication for Insertion or Continuance of Line Prolonged intravenous therapies 05/25/2018 12:00 PM  Exposed Catheter (cm) 0 cm 05/25/2018 12:00 PM  Site Assessment Clean;Dry;Intact 05/25/2018 12:00 PM  Lumen #1 Status Flushed;Blood return noted 05/25/2018 12:00 PM  Lumen #2 Status Flushed;Blood return noted 05/25/2018 12:00 PM  Dressing Type Transparent 05/25/2018 12:00 PM  Dressing Status Clean;Dry;Intact;Antimicrobial disc in place 05/25/2018 12:00 PM  Dressing Change Due 06/01/18 05/25/2018 12:00 PM       Jule Economy [place] 05/25/2018, 12:24 PM

## 2018-05-25 NOTE — Progress Notes (Addendum)
Nutrition Follow-up  DOCUMENTATION CODES:   Non-severe (moderate) malnutrition in context of social or environmental circumstances  INTERVENTION:   Osmolite 1.5 @ 60 ml/hr (1440 ml) via Cortrak  Pro-Stat 30 mL daily Provides 107 g of protein, 2260 kcals and 1094 mL of free water  NUTRITION DIAGNOSIS:   Moderate Malnutrition related to social / environmental circumstances(EtOH and tobacco abuse) as evidenced by mild fat depletion, mild muscle depletion.  Ongoing  GOAL:   Patient will meet greater than or equal to 90% of their needs  Not Met- being addressed with TF  MONITOR:   TF tolerance, Vent status, Labs, Weight trends  REASON FOR ASSESSMENT:   Ventilator, Consult Enteral/tube feeding initiation and management  ASSESSMENT:   67 yo male admitted with right-sided rib fractures s/p fall, also wit small pneumothorax on 12/9. On 12/13, pt with decline requiring intubation. PMH includes CAD, stroke, EtOH and tobacco abuse, hyponatremia   12/09 Admit post fall with rib fractures, small PTX 12/09 CIWA protocol with EtOH withdrawal 12/13 Rapid response, transferred to ICU Intubated, TF initiated 12/18 Self-Extubated, TF d/c, NPO 12/20- NPO, Cortrak placed post-pyloric   NPO day 3 post self extubation. SLP unable to see pt today due to agitation. To begin TF today. With present malnutrition pt is at risk for refeeding. Will monitor electrolytes closely. PICC placed today after central line was pulled out. Precedex restarted.   Weight noted to decrease from 75.6 kg yesterday to 70.5 kg today. Pt continues to diurese.  . I/O: -187 ml since admit UOP: 3545 ml x 24 hrs   Medications reviewed and include: folic acid, 40 mg lasix BID, miralax, 40 mEq KCl, precedex Labs reviewed: K 3.3 (L) Phos & Mg WDL  Diet Order:   Diet Order            Diet NPO time specified Except for: Other (See Comments)  Diet effective ____              EDUCATION NEEDS:   Not appropriate  for education at this time  Skin:  Skin Assessment: Skin Integrity Issues:(no pressure injury noted) Skin Integrity Issues:: Other (Comment) Other: burn marks on feet  Last BM:  12/19  Height:   Ht Readings from Last 1 Encounters:  05/18/18 6' 2"  (1.88 m)    Weight:   Wt Readings from Last 1 Encounters:  05/25/18 70.5 kg    Ideal Body Weight:     BMI:  Body mass index is 19.96 kg/m.  Estimated Nutritional Needs:   Kcal:  1610-9604 kcals   Protein:  105-120 g   Fluid:  >/= 2 L   Mariana Single RD, LDN Clinical Nutrition Pager # [phone]

## 2018-05-25 NOTE — Progress Notes (Signed)
NAME:  [name] Pace, MRN:  [id number], DOB:  1951-04-28, LOS: 81 ADMISSION DATE:  05/13/2018, CONSULTATION DATE:  05/20/18 REFERRING MD:  Dr. Earnest Conroy, CHIEF COMPLAINT:  Tachypnea    Brief History   67 y/o M, smoker, admitted 12/8 after a fall with right sided chest pain.  Work up revealed multiple right sided rib fractures and small pneumothorax which did not require a chest tube.  PCCM called to see the patient  12/13 for tachypnea and increasing O2 needs. At that time, CXR showed resolution of pneumothorax.  ABG 7.45 / 36 / 88.     Past Medical History  Breast cancer  CVA Prostate cancer  Anxiety  ETOH abuse  HLD   Significant Hospital Events   12/08 Admit after fall with rib fractures, small PTX 12/09 CIWA protocol for ETOH withdrawal 12/13 Rapid response PCCM consulted  12/14 Precedex changed to Fentanyl for pain control 12/15- RN reports aline non-functional.  Levophed at 8 mcg, fentanyl at 440mcg's. 12/16-  precedex started this morning due to agitation despite dilaudid gtt => helping / + 14L since admit. Off levophed. On TF. Getting 200cc/h saline stopped/ diuresis started  12/17 -  remains on precedex 0.3 and dilaudid gtt at 2-4 mg/hr.  RN states patient wakes up panicked and gets combative.   -3L diuresis from yesterday   12/18 - s/p Rt thora 438mL clear fluid. Exudate by LDH with 55% lympos and 25% polys and 8% eos (cytology pending)  -> self extubated - > very weak voice and diffusely weak, 5L [place] and pulse ox 86%, very weak cough. Oriented (off dilaudid gtt) and wants to go home   12/19 - remains extubated. Never needed percedex. But needs sitter. Asked for heroin v dilaudid opioids once (brother died of opioid use). Very deconditioned - high potential SNF patient but family concerned he will sign out AMA. Pulse ox 92% on 5LNC currently.  Noted: -  RN report: patient verbally abusive using terms such as "foreigner" to the sitter. CAM-ICU mildly positive for delirium but  improved since yesterday. Sitter being replaced as patient gets more agitated in presence of sitter. - Able to take po per RN - also ongoing pain from rib fractures  - moprhine 1mg  prn not helping    Consults:  PCCM 12/13   Procedures:  R IJ ? Date > out 05/25/18 PICC ordered 05/25/18    Significant Diagnostic Tests:  CT Chest 12/8 >> multiple mildly displaced right rib fractures 5-11th ribs, minimal right apical pneumothorax, rounded structure in the anterior mediastinum, likely a saccular aneurysm CT Chest 12/14 >> no PTX, moderate bilateral pleural effusions with adjacent atelectasis, multiple moderately displaced rib fractures with overlying sq emphysema of right chest wall, 2.4 cm rounded abnormality to the left of the ascending thoracid aorta which is concerning for possible aneurysm   Micro Data:  UA 12/12 >> many bacteria, nitrite +, small leukocytes, 21-50 WBC Trach aspirate 12/13 - strep pneumo Urine strep 12/19 - neg Urine leg 12/19 -   Antimicrobials:  Levaquin 12/13 >> 12/21 (7d course, pneumococcus) Flagyl 12/13  - ? Stop date  Interim history/subjective:   12/20 - precedex stopped due to HR 40s. Yesterday PM combo of haldol prn, precedex gtt and ativan prn seemed to help take edge off. Today AM - worse agitation and confusion. QTC ok on ekg but possible lateral wall changes + . Social: cocaine, heroin and etoh -  Addict; lives off trust fund per report  Objective  Blood pressure (!) 132/94, pulse 88, temperature 98.4 F (36.9 C), temperature source Oral, resp. rate 17, height 6\' 2"  (1.88 m), weight 70.5 kg, SpO2 95 %.    FiO2 (%):  [36 %] 36 %   Intake/Output Summary (Last 24 hours) at 05/25/2018 0827 Last data filed at 05/25/2018 0700 Gross per 24 hour  Intake 114.77 ml  Output 3546 ml  Net -3431.23 ml   Filed Weights   05/23/18 0333 05/24/18 0455 05/25/18 0600  Weight: 81 kg 75.6 kg 70.5 kg     General Appearance:  Looks criticall ill,  deconditioned Head:  Normocephalic, without obvious abnormality, atraumatic Eyes:  PERRL - yes, conjunctiva/corneas - clear     Ears:  Normal external ear canals, both ears Nose:  G tube - no Throat:  ETT TUBE - no , OG tube - no Neck:  Supple,  No enlargement/tenderness/nodules Lungs: Clear to auscultation bilaterally,  Heart:  S1 and S2 normal, no murmur, CVP - no.  Pressors - no Abdomen:  Soft, no masses, no organomegaly Genitalia / Rectal:  Not done Extremities:  Extremities- intact Skin:  ntact in exposed areas . Sacral area - not examined Neurologic:  Sedation - none -> RASS - +2 . Moves all 4s - yes. CAM-ICU - positive . Orientation - only to self     LABS    PULMONARY Recent Labs  Lab 05/18/18 0914 05/19/18 0451 05/23/18 1159  PHART 7.384 7.470* 7.449  PCO2ART 35.5 28.4* 43.5  PO2ART 266.0* 111.0* 61.0*  HCO3 21.2 20.7 30.2*  TCO2 22 22 31   O2SAT 100.0 99.0 92.0    CBC Recent Labs  Lab 05/23/18 0339 05/24/18 0438 05/25/18 0446  HGB 10.4* 11.5* 10.9*  HCT 31.8* 33.6* 33.2*  WBC 7.2 11.2* 8.0  PLT 401* 473* 455*    COAGULATION No results for input(s): INR in the last 168 hours.  CARDIAC  No results for input(s): TROPONINI in the last 168 hours. No results for input(s): PROBNP in the last 168 hours.   CHEMISTRY Recent Labs  Lab 05/19/18 0915 05/20/18 0358 05/20/18 1626 05/21/18 0438 05/21/18 2306 05/22/18 0502 05/23/18 0339 05/23/18 0340 05/24/18 0438 05/25/18 0446  NA 137 138  --  139 140  --   --  137  --   --   K 3.2* 4.0  --  4.3 4.5  --   --  4.6  --   --   CL 110 108  --  110 107  --   --  101  --   --   CO2 20* 20*  --  21* 24  --   --  28  --   --   GLUCOSE 136* 123*  --  107* 97  --   --  108*  --   --   BUN 10 14  --  13 17  --   --  21  --   --   CREATININE 0.55* 0.55*  --  0.61 0.60*  --   --  0.71  --   --   CALCIUM 7.4* 7.5*  --  7.6* 7.9*  --   --  8.1*  --   --   MG 1.7 1.6* 1.6*  --   --  1.8 2.0  --  1.9 1.8  PHOS 2.5  2.6 2.9  --   --  3.9  --  5.7* 4.0 3.7   Estimated Creatinine Clearance: 91.8 mL/min (by C-G formula based on SCr of  0.71 mg/dL).   LIVER Recent Labs  Lab 05/22/18 0502 05/23/18 0340  AST 36  --   ALT 15  --   ALKPHOS 45  --   BILITOT 1.0  --   PROT 4.7*  --   ALBUMIN 1.5* 1.6*     INFECTIOUS Recent Labs  Lab 05/21/18 2306  LATICACIDVEN 1.1     ENDOCRINE CBG (last 3)  Recent Labs    05/24/18 1608 05/24/18 2004 05/25/18 0025  GLUCAP 91 97 82    IMAGING x48h  - image(s) personally visualized  -   highlighted in bold Dg Chest 1 View  Result Date: 05/23/2018 CLINICAL DATA:  Status post right-sided thoracentesis with removal of 450 cc of fluid. EXAM: CHEST  1 VIEW COMPARISON:  Portable chest x-ray of May 22, 2018 FINDINGS: The volume of pleural fluid on the right has decreased somewhat. There is no postprocedure pneumothorax. There is no mediastinal shift. The left lung exhibits medial basal density compatible with atelectasis. The heart and pulmonary vascularity are normal. There are post CABG changes. The endotracheal tube tip projects approximately 3.4 cm above the carina. The esophagogastric tube tip projects below the inferior margin of the image. The right internal jugular venous catheter tip projects over the midportion of the SVC. IMPRESSION: No postprocedure complication following right-sided thoracentesis. Persistent bibasilar atelectasis. Small right pleural effusion. The support tubes are in reasonable position. Electronically Signed   By: David  Martinique M.D.   On: 05/23/2018 09:31   Dg Chest Port 1 View  Result Date: 05/23/2018 CLINICAL DATA:  Recent fall with chest pain and cough, initial encounter EXAM: PORTABLE CHEST 1 VIEW COMPARISON:  05/23/18 FINDINGS: Endotracheal tube and nasogastric catheter have been removed in the interval. Right jugular central line remains in place. Postsurgical changes are seen. Cardiac shadow is stable. Left subclavian  arterial stent is seen. Multiple right-sided rib fractures are noted with slight increase in the degree of right-sided pleural effusion and underlying atelectatic changes. No pneumothorax is seen. IMPRESSION: Increasing right-sided effusion and atelectasis. Multiple right rib fractures. Electronically Signed   By: Inez Catalina M.D.   On: 05/23/2018 12:24   US Thoracentesis Asp Pleural Space W/img Guide  Result Date: 05/23/2018 INDICATION: Recent fall with multiple right rib fractures. Right pleural effusion. Failure to wean from mechanical ventilator. Request for diagnostic and therapeutic thoracentesis. EXAM: ULTRASOUND GUIDED RIGHT THORACENTESIS MEDICATIONS: 1% lidocaine 10 mL COMPLICATIONS: None immediate. PROCEDURE: An ultrasound guided thoracentesis was thoroughly discussed with the patient and questions answered. The benefits, risks, alternatives and complications were also discussed. The patient understands and wishes to proceed with the procedure. Written consent was obtained. Ultrasound was performed to localize and mark an adequate pocket of fluid in the right chest. The area was then prepped and draped in the normal sterile fashion. 1% Lidocaine was used for local anesthesia. Under ultrasound guidance a 6 Fr Safe-T-Centesis catheter was introduced. Thoracentesis was performed. The catheter was removed and a dressing applied. FINDINGS: A total of approximately 450 mL of clear red fluid was removed. Samples were sent to the laboratory as requested by the clinical team. IMPRESSION: Successful ultrasound guided right thoracentesis yielding 450 mL of pleural fluid. No pneumothorax on post-procedure chest x-ray. Read by: Gareth Eagle, PA-C Electronically Signed   By: Jacqulynn Cadet M.D.   On: 05/23/2018 10:18   Resolved Hospital Problem list    pneumohtorax Septic shock  Assessment & Plan:   Mechanical Fall with Multiple Rib Fractures SEvere Pain with agitated  delirium from pain and etoh  withdrawal and hx of CVA  05/25/2018 - ongoing delirium agitation slowly worsening post extubation. PRecedex stopped dur to HR 40s but BP was intact. Haldol x 1 given yesterday and some ativan overnight  P: Continue fent prn Restart precedex gtt Change seroquel to haldol scheduled ( QTc 450s  05/26/18 on EKG) Continue to hold off  Gabapentin , oxy IR and klonopin - can add judiciously if needed lidoderm patch to area to continue Sitter to continue  Continue daily ASA/ thiamine/ folate  Acute Hypoxic Respiratory Failure - s/p self extubation 05/23/18 -in setting multiple rib fractures, atelectasis, pneumothorax,   -? Layering effusion vs hemithorax given multiple rib fx -  12/18  - s/p thora 12/18 - 450cc removed . Exudate. Cytology pending   On 05/25/18 = remains extubated but still needs  o2 - 5L [place] though distress less.   P: - [place] for pulse ox > 88% - pulm toilet - check cxr prior to dc - await fluid cytology from 05/23/18    Pneumothorax  P: Resolved, monitor    Abnormal ECHO 05/21/18 -  suggestive of mild cor pulmonale  12/20 - EKG changes + lateral wall  Plan  - cycle troponin   Shock   -suspect component sepsis and sedation effects; since resolved P: Goal MAP >65   Sepsis  - initial concern of aspiration v UTI. On 12/19 - trach culture +ve for strep pneumoniae  - afebrile as of 05/24/18 atleast since 05/18/18  P: levaquin 2 more days from 05/24/18 to complete 7d course (QTC on ekg 05/26/18 - < 597msec))   Volume overload - +11.6L as of 05/21/18   - on 05/25/18 with lasix down tto Net +2.85L - BUn/Creat and K toleating  P:  Reduce lasix 80mg  q 8hr  To 40mg  IV Q12h Trend renal function/ electrolytes Try without foley  ELectrolyte imbalance - low mag 05/25/18 - will replete - monitor K and lytes with Lsix  MSK  A: Physical deconditioning P: PT/OT   Best practice:  Diet: NPO but advance as tolerated based on delrium Pain/Anxiety/Delirium  protocol (if indicated): see CNS Section VAP protocol (if indicated): NA DVT prophylaxis: scd's  GI prophylaxis: pepcid  Glucose control: n/a  Mobility: bedrest  Code Status: Full code  Family Communication: Daughter updated at bedside 05/20/18. None at bedside 05/21/18 or 12/17 or 05/23/18 or 05/24/18 or 05/25/18  Disposition:  Keep in ICU Social: Substance abuser. Not in touch with daughter. High risk for declining SNF services  SERVICE ;Given to [facility] for 05/25/18 but with precedex need = pccm now primary      ATTESTATION & SIGNATURE   The patient SHALEV HELMINIAK is critically ill with multiple organ systems failure and requires high complexity decision making for assessment and support, frequent evaluation and titration of therapies, application of advanced monitoring technologies and extensive interpretation of multiple databases.   Critical Care Time devoted to patient care services described in this note is  30  Minutes. This time reflects time of care of this signee Dr Brand Males. This critical care time does not reflect procedure time, or teaching time or supervisory time of PA/NP/Med student/Med Resident etc but could involve care discussion time     Dr. Brand Males, M.D., [facility].C.P Pulmonary and Critical Care Medicine Staff Physician [place] [facility] Pager: [phone], If no answer or between  15:00h - 7:00h: call [phone]  05/25/2018 9:00 AM

## 2018-05-25 NOTE — Progress Notes (Signed)
SLP Cancellation Note  Patient Details Name: [name] Pace MRN: [id number] DOB: 1951/02/24   Cancelled treatment:         Spoke with RN and SLP observing pt is currently agitated, trying to climb out of the bed and not appropriate for swallow eval at present. Will continue efforts.   Houston Siren 05/25/2018, 9:55 AM    Orbie Pyo Colvin Caroli.Ed Risk analyst [phone] Office [phone]

## 2018-05-26 ENCOUNTER — Inpatient Hospital Stay (HOSPITAL_COMMUNITY): Payer: Medicare Other

## 2018-05-26 LAB — BASIC METABOLIC PANEL
Anion gap: 12 (ref 5–15)
BUN: 16 mg/dL (ref 8–23)
CO2: 33 mmol/L — ABNORMAL HIGH (ref 22–32)
Calcium: 8.3 mg/dL — ABNORMAL LOW (ref 8.9–10.3)
Chloride: 96 mmol/L — ABNORMAL LOW (ref 98–111)
Creatinine, Ser: 0.6 mg/dL — ABNORMAL LOW (ref 0.61–1.24)
GFR calc Af Amer: >60 mL/min (ref >60)
GFR calc non Af Amer: >60 mL/min (ref >60)
Glucose, Bld: 166 mg/dL — ABNORMAL HIGH (ref 70–99)
POTASSIUM: 2.7 mmol/L — AB (ref 3.5–5.1)
Sodium: 141 mmol/L (ref 135–145)

## 2018-05-26 LAB — CBC WITH DIFFERENTIAL/PLATELET
Abs Immature Granulocytes: 0.04 10*3/uL (ref 0.00–0.07)
Basophils Absolute: 0.1 10*3/uL (ref 0.0–0.1)
Basophils Relative: 1 %
Eosinophils Absolute: 0.2 10*3/uL (ref 0.0–0.5)
Eosinophils Relative: 2 %
HEMATOCRIT: 33.7 % — AB (ref 39.0–52.0)
Hemoglobin: 11.1 g/dL — ABNORMAL LOW (ref 13.0–17.0)
Immature Granulocytes: 1 %
LYMPHS ABS: 1 10*3/uL (ref 0.7–4.0)
Lymphocytes Relative: 14 %
MCH: 34.6 pg — ABNORMAL HIGH (ref 26.0–34.0)
MCHC: 32.9 g/dL (ref 30.0–36.0)
MCV: 105 fL — ABNORMAL HIGH (ref 80.0–100.0)
Monocytes Absolute: 0.8 10*3/uL (ref 0.1–1.0)
Monocytes Relative: 12 %
NEUTROS ABS: 5.1 10*3/uL (ref 1.7–7.7)
Neutrophils Relative %: 70 %
Platelets: 541 10*3/uL — ABNORMAL HIGH (ref 150–400)
RBC: 3.21 MIL/uL — ABNORMAL LOW (ref 4.22–5.81)
RDW: 12.3 % (ref 11.5–15.5)
WBC: 7.2 10*3/uL (ref 4.0–10.5)
nRBC: 0 % (ref 0.0–0.2)

## 2018-05-26 LAB — PHOSPHORUS: Phosphorus: 2.9 mg/dL (ref 2.5–4.6)

## 2018-05-26 LAB — MAGNESIUM: Magnesium: 2.4 mg/dL (ref 1.7–2.4)

## 2018-05-26 LAB — GLUCOSE, CAPILLARY
GLUCOSE-CAPILLARY: 101 mg/dL — AB (ref 70–99)
GLUCOSE-CAPILLARY: 103 mg/dL — AB (ref 70–99)
Glucose-Capillary: 112 mg/dL — ABNORMAL HIGH (ref 70–99)
Glucose-Capillary: 114 mg/dL — ABNORMAL HIGH (ref 70–99)
Glucose-Capillary: 119 mg/dL — ABNORMAL HIGH (ref 70–99)
Glucose-Capillary: 119 mg/dL — ABNORMAL HIGH (ref 70–99)
Glucose-Capillary: 123 mg/dL — ABNORMAL HIGH (ref 70–99)
Glucose-Capillary: 125 mg/dL — ABNORMAL HIGH (ref 70–99)
Glucose-Capillary: 91 mg/dL (ref 70–99)
Glucose-Capillary: 91 mg/dL (ref 70–99)
Glucose-Capillary: 97 mg/dL (ref 70–99)

## 2018-05-26 LAB — TROPONIN I: Troponin I: <0.03 ng/mL (ref <0.03)

## 2018-05-26 LAB — POTASSIUM: Potassium: 3.6 mmol/L (ref 3.5–5.1)

## 2018-05-26 MED ORDER — QUETIAPINE FUMARATE 50 MG PO TABS
50.0000 mg | ORAL_TABLET | Freq: Two times a day (BID) | ORAL | Status: DC
  Administered 2018-05-27 – 2018-05-29 (×6): 50 mg
  Filled 2018-05-26 (×6): qty 1

## 2018-05-26 MED ORDER — POTASSIUM CHLORIDE 20 MEQ/15ML (10%) PO SOLN
40.0000 meq | ORAL | Status: AC
  Administered 2018-05-26 (×2): 40 meq
  Filled 2018-05-26 (×2): qty 30

## 2018-05-26 MED ORDER — POTASSIUM CHLORIDE 20 MEQ/15ML (10%) PO SOLN
40.0000 meq | Freq: Two times a day (BID) | ORAL | Status: DC
  Administered 2018-05-26 – 2018-05-30 (×8): 40 meq
  Filled 2018-05-26 (×8): qty 30

## 2018-05-26 MED ORDER — POLYETHYLENE GLYCOL 3350 17 G PO PACK
17.0000 g | PACK | Freq: Every day | ORAL | Status: DC
  Administered 2018-05-28 – 2018-06-01 (×4): 17 g
  Filled 2018-05-26 (×5): qty 1

## 2018-05-26 MED ORDER — FOLIC ACID 1 MG PO TABS
1.0000 mg | ORAL_TABLET | Freq: Every day | ORAL | Status: DC
  Administered 2018-05-27 – 2018-06-02 (×7): 1 mg
  Filled 2018-05-26 (×8): qty 1

## 2018-05-26 MED ORDER — LACTATED RINGERS IV BOLUS
500.0000 mL | Freq: Once | INTRAVENOUS | Status: AC
  Administered 2018-05-27: 500 mL via INTRAVENOUS

## 2018-05-26 MED ORDER — ASPIRIN 325 MG PO TABS
325.0000 mg | ORAL_TABLET | Freq: Every day | ORAL | Status: DC
  Administered 2018-05-27 – 2018-05-29 (×3): 325 mg
  Filled 2018-05-26 (×4): qty 1

## 2018-05-26 MED ORDER — FAMOTIDINE 40 MG/5ML PO SUSR
20.0000 mg | Freq: Two times a day (BID) | ORAL | Status: DC
  Administered 2018-05-26 – 2018-05-28 (×5): 20 mg
  Filled 2018-05-26 (×6): qty 2.5

## 2018-05-26 NOTE — Progress Notes (Signed)
NAME:  [name] Pace, MRN:  [id number], DOB:  December 01, 1950, LOS: 55 ADMISSION DATE:  05/13/2018, CONSULTATION DATE:  05/20/18 REFERRING MD:  Dr. Earnest Conroy, CHIEF COMPLAINT:  Tachypnea    Brief History   67 y/o M, smoker, admitted 12/8 after a fall with right sided chest pain.  Work up revealed multiple right sided rib fractures and small pneumothorax which did not require a chest tube.  PCCM called to see the patient  12/13 for tachypnea and increasing O2 needs. At that time, CXR showed resolution of pneumothorax.  ABG 7.45 / 36 / 88.     Past Medical History  Breast cancer  CVA Prostate cancer  Anxiety  ETOH abuse  HLD   Significant Hospital Events   12/08 Admit after fall with rib fractures, small PTX 12/09 CIWA protocol for ETOH withdrawal 12/13 Rapid response PCCM consulted  12/14 Precedex changed to Fentanyl for pain control 12/15- RN reports aline non-functional.  Levophed at 8 mcg, fentanyl at 43mcg's. 12/16-  precedex started this morning due to agitation despite dilaudid gtt => helping / + 14L since admit. Off levophed. On TF. Getting 200cc/h saline stopped/ diuresis started  12/17 -  remains on precedex 0.3 and dilaudid gtt at 2-4 mg/hr.  RN states patient wakes up panicked and gets combative.   -3L diuresis from yesterday   12/18 - s/p Rt thora 467mL clear fluid. Exudate by LDH with 55% lympos and 25% polys and 8% eos (cytology pending)  -> self extubated - > very weak voice and diffusely weak, 5L [place] and pulse ox 86%, very weak cough. Oriented (off dilaudid gtt) and wants to go home   12/19 - remains extubated. Never needed percedex. But needs sitter. Asked for heroin v dilaudid opioids once (brother died of opioid use). Very deconditioned - high potential SNF patient but family concerned he will sign out AMA. Pulse ox 92% on 5LNC currently.  Noted: -  RN report: patient verbally abusive using terms such as "foreigner" to the sitter. CAM-ICU mildly positive for delirium but  improved since yesterday. Sitter being replaced as patient gets more agitated in presence of sitter. - Able to take po per RN - also ongoing pain from rib fractures  - moprhine 1mg  prn not helping  12/21: currently on precedex.  Consults:  PCCM 12/13   Procedures:  R IJ ? Date > out 05/25/18 PICC placed 05/25/18    Significant Diagnostic Tests:  CT Chest 12/8 >> multiple mildly displaced right rib fractures 5-11th ribs, minimal right apical pneumothorax, rounded structure in the anterior mediastinum, likely a saccular aneurysm CT Chest 12/14 >> no PTX, moderate bilateral pleural effusions with adjacent atelectasis, multiple moderately displaced rib fractures with overlying sq emphysema of right chest wall, 2.4 cm rounded abnormality to the left of the ascending thoracid aorta which is concerning for possible aneurysm   Micro Data:  UA 12/12 >> many bacteria, nitrite +, small leukocytes, 21-50 WBC Trach aspirate 12/13 - strep pneumo Urine strep 12/19 - neg Urine leg 12/19 -   Antimicrobials:  Levaquin 12/13 >> 12/21 (7d course, pneumococcus) Flagyl 12/13  - ? Stop date  Interim history/subjective:   12/20 - precedex stopped due to HR 40s. Yesterday PM combo of haldol prn, precedex gtt and ativan prn seemed to help take edge off. Today AM - worse agitation and confusion. QTC ok on ekg but possible lateral wall changes + . Social: cocaine, heroin and etoh -  Addict; lives off trust fund per  report  Objective   Blood pressure (!) 135/56, pulse 61, temperature (!) 96.6 F (35.9 C), temperature source Axillary, resp. rate (!) 34, height 6\' 2"  (1.88 m), weight 66 kg, SpO2 92 %.    FiO2 (%):  [36 %] 36 %   Intake/Output Summary (Last 24 hours) at 05/26/2018 1313 Last data filed at 05/26/2018 1200 Gross per 24 hour  Intake 1146.27 ml  Output 2645 ml  Net -1498.73 ml   Filed Weights   05/25/18 0600 05/26/18 0000 05/26/18 0500  Weight: 70.5 kg 66 kg 66 kg     General Appearance:   Looks criticall ill, deconditioned Head:  Normocephalic, without obvious abnormality, atraumatic Eyes:  PERRL - yes, conjunctiva/corneas - clear     Ears:  Normal external ear canals, both ears Nose:  G tube - yes in the right nares Throat:  ETT TUBE - no , OG tube - no Neck:  Supple,  No enlargement/tenderness/nodules Lungs: Clear to auscultation bilaterally,  Heart:  S1 and S2 normal, no murmur, CVP - no.  Pressors - no Abdomen:  Soft, no masses, no organomegaly Genitalia / Rectal:  Not done Extremities:  Extremities- intact Skin:  intact in exposed areas . Sacral area - not examined Neurologic:  Sedation -precedex -> RASS - +2 . Moves all 4s - yes. CAM-ICU - positive . Orientation - only to self     LABS    PULMONARY Recent Labs  Lab 05/23/18 1159  PHART 7.449  PCO2ART 43.5  PO2ART 61.0*  HCO3 30.2*  TCO2 31  O2SAT 92.0    CBC Recent Labs  Lab 05/24/18 0438 05/25/18 0446 05/26/18 0512  HGB 11.5* 10.9* 11.1*  HCT 33.6* 33.2* 33.7*  WBC 11.2* 8.0 7.2  PLT 473* 455* 541*    COAGULATION No results for input(s): INR in the last 168 hours.  CARDIAC   Recent Labs  Lab 05/25/18 1036 05/25/18 1540 05/25/18 2041 05/26/18 0512  TROPONINI <0.03 <0.03 <0.03 <0.03   No results for input(s): PROBNP in the last 168 hours.   CHEMISTRY Recent Labs  Lab 05/21/18 0438 05/21/18 2306 05/22/18 0502 05/23/18 0339 05/23/18 0340 05/24/18 0438 05/25/18 0446 05/25/18 1036 05/25/18 2350 05/26/18 0512 05/26/18 0753  NA 139 140  --   --  137  --   --  141 141  --   --   K 4.3 4.5  --   --  4.6  --   --  3.3* 2.7*  --  3.6  CL 110 107  --   --  101  --   --  95* 96*  --   --   CO2 21* 24  --   --  28  --   --  29 33*  --   --   GLUCOSE 107* 97  --   --  108*  --   --  99 166*  --   --   BUN 13 17  --   --  21  --   --  14 16  --   --   CREATININE 0.61 0.60*  --   --  0.71  --   --  0.90 0.60*  --   --   CALCIUM 7.6* 7.9*  --   --  8.1*  --   --  8.4* 8.3*  --   --    MG  --   --  1.8 2.0  --  1.9 1.8  --   --  2.4  --   PHOS  --   --  3.9  --  5.7* 4.0 3.7  --   --  2.9  --    Estimated Creatinine Clearance: 85.9 mL/min (A) (by C-G formula based on SCr of 0.6 mg/dL (L)).   LIVER Recent Labs  Lab 05/22/18 0502 05/23/18 0340  AST 36  --   ALT 15  --   ALKPHOS 45  --   BILITOT 1.0  --   PROT 4.7*  --   ALBUMIN 1.5* 1.6*     INFECTIOUS Recent Labs  Lab 05/21/18 2306  LATICACIDVEN 1.1     ENDOCRINE CBG (last 3)  Recent Labs    05/26/18 0420 05/26/18 0755 05/26/18 1133  GLUCAP 119* 125* 123*    IMAGING x48h  - image(s) personally visualized  -   highlighted in bold [place] Ekg Site Rite  Result Date: 05/25/2018 If Site Rite image not attached, placement could not be confirmed due to current cardiac rhythm.  Resolved Hospital Problem list    pneumohtorax Septic shock  Assessment & Plan:   Mechanical Fall with Multiple Rib Fractures Severe Pain with agitated delirium from pain and etoh withdrawal and hx of CVA  05/26/2018 - ongoing delirium agitation slowly worsening post extubation. Precedex resumed; no hypotension or bradycardia. P: Continue fent prn Restart precedex gtt Change seroquel to haldol scheduled ( QTc 450s  05/24/18 on EKG) Continue to hold off  Gabapentin , oxy IR and klonopin - can add judiciously if needed lidoderm patch to area to continue Sitter to continue  Continue daily ASA/ thiamine/ folate  Acute Hypoxic Respiratory Failure - s/p self extubation 05/23/18 -in setting multiple rib fractures, atelectasis, pneumothorax,   -? Layering effusion vs hemithorax given multiple rib fx -  12/18  - s/p thora 12/18 - 450cc removed . Exudate. Cytology show reactive atypical mesothelial cells.   On 05/25/18 = remains extubated but still needs  o2 - 5L [place] though distress less.   P: - [place] for pulse ox > 88% - pulm toilet - check cxr prior to dc     Pneumothorax  P: Resolved, monitor    Abnormal ECHO  05/21/18 -  suggestive of mild cor pulmonale  12/20 - EKG changes + lateral wall  Plan  - cardiac troponins negative x 3  Shock   -suspect component sepsis and sedation effects; since resolved P: Goal MAP >65   Sepsis  - initial concern of aspiration v UTI. On 12/19 - trach culture +ve for strep pneumoniae  - afebrile as of 05/24/18 atleast since 05/18/18  P: levaquin 2 more days from 05/24/18 to complete 7d course (QTC on ekg 05/24/18 - < 579msec))   Volume overload - +11.6L as of 05/21/18   - on 05/25/18 with lasix down tto Net +2.85L - BUn/Creat and K toleating  P:  Reduce lasix 80mg  q 8hr  To 40mg  IV Q12h Trend renal function/ electrolytes   ELectrolyte imbalance - low mag 05/25/18 - repleted - monitor K and lytes with Lsix  MSK  A: Physical deconditioning P: PT/OT   Best practice:  Diet: enteral feeding. Pain/Anxiety/Delirium protocol (if indicated): see CNS Section VAP protocol (if indicated): NA DVT prophylaxis: scd's  GI prophylaxis: pepcid  Glucose control: n/a  Mobility: bedrest  Code Status: Full code  Family Communication: Daughter updated at bedside 05/20/18. None at bedside 05/21/18 or 12/17 or 05/23/18 or 05/24/18 or 05/25/18 or 05/26/18 Disposition:  Keep in ICU Social: Substance abuser.  Not in touch with daughter. High risk for declining SNF services  SERVICE ;Given to [facility] for 05/25/18 but with precedex need = pccm now primary      ATTESTATION & SIGNATURE   The patient [name] Pace is critically ill with multiple organ systems failure and requires high complexity decision making for assessment and support, frequent evaluation and titration of therapies, application of advanced monitoring technologies and extensive interpretation of multiple databases.   Critical Care Time devoted to patient care services described in this note is  30  Minutes. This time reflects time of care of this signee Dr Salley Scarlet. This critical care time does  not reflect procedure time, or teaching time or supervisory time of PA/NP/Med student/Med Resident etc but could involve care discussion time     Salley Scarlet, M.D., F.C.C.P Pulmonary and Critical Care Medicine Staff Physician [facility] Pager:  call [phone]  05/26/2018 1:13 PM

## 2018-05-26 NOTE — Progress Notes (Signed)
RT note-Patient is very combative and restless, difficult to reason with at times. Combative during treatment, treatment have been discontinued a PRN order does remain if patient needs one. Continue to monitor.

## 2018-05-26 NOTE — Procedures (Signed)
Cortrak  Person Inserting Tube:  Burtis Junes A, RD Tube Type:  Cortrak - 43 inches Tube Location:  Right nare Initial Placement:  Postpyloric Secured by: Bridle Technique Used to Measure Tube Placement:  Documented cm marking at nare/ corner of mouth Cortrak Secured At:  90 cm    Cortrak Tube Team Note:  RD contacted d/t partial tube displacement. Pt pulled it out ~8 cm using his teeth.   RD reinserted stylet w/ monitor showing tip in antrum. Given high aspiration risk secondary to agitation/pt preferred positioning, undid clip and advanced tube postpyloric.   No x-ray is required. RN may begin using tube.    If the tube becomes dislodged please keep the tube and contact the Cortrak team at [email or web address] (password [email or web address]) for replacement.  If after hours and replacement cannot be delayed, place a NG tube and confirm placement with an abdominal x-ray.    Burtis Junes RD, LDN, CNSC Clinical Nutrition Available Tues-Sat via Pager: [phone] 05/26/2018 10:48 AM

## 2018-05-26 NOTE — Progress Notes (Signed)
SLP Cancellation Note  Patient Details Name: [name] Pace MRN: [id number] DOB: 06/06/1951   Cancelled treatment:       Reason Eval/Treat Not Completed: Medical issues which prohibited therapy. Per RN pt remains agitated, kicking and attempting to get out of bed. Cortrak replaced this morning. Will follow up for readiness next date.  Deneise Lever, [place], CCC-SLP Speech-Language Pathologist Acute Rehabilitation Services Pager: [phone] Office: [phone]    Aliene Altes 05/26/2018, 12:33 PM

## 2018-05-26 NOTE — Progress Notes (Signed)
eLink Physician-Brief Progress Note Patient Name: [name] Pace DOB: 1950-06-17 MRN: [id number]   Date of Service  05/26/2018  HPI/Events of Note  Hypokalemia  eICU Interventions  Potassium replaced     Intervention Category Intermediate Interventions: Electrolyte abnormality - evaluation and management  DETERDING,ELIZABETH 05/26/2018, 12:59 AM

## 2018-05-27 ENCOUNTER — Inpatient Hospital Stay (HOSPITAL_COMMUNITY): Payer: Medicare Other

## 2018-05-27 DIAGNOSIS — J9600 Acute respiratory failure, unspecified whether with hypoxia or hypercapnia: Secondary | ICD-10-CM

## 2018-05-27 LAB — CBC WITH DIFFERENTIAL/PLATELET
Abs Immature Granulocytes: 0.04 10*3/uL (ref 0.00–0.07)
BASOS PCT: 1 %
Basophils Absolute: 0.1 10*3/uL (ref 0.0–0.1)
EOS ABS: 0.2 10*3/uL (ref 0.0–0.5)
Eosinophils Relative: 2 %
HEMATOCRIT: 35.6 % — AB (ref 39.0–52.0)
Hemoglobin: 11.7 g/dL — ABNORMAL LOW (ref 13.0–17.0)
Immature Granulocytes: 0 %
LYMPHS ABS: 1.6 10*3/uL (ref 0.7–4.0)
Lymphocytes Relative: 17 %
MCH: 34.6 pg — ABNORMAL HIGH (ref 26.0–34.0)
MCHC: 32.9 g/dL (ref 30.0–36.0)
MCV: 105.3 fL — AB (ref 80.0–100.0)
Monocytes Absolute: 1 10*3/uL (ref 0.1–1.0)
Monocytes Relative: 11 %
Neutro Abs: 6.4 10*3/uL (ref 1.7–7.7)
Neutrophils Relative %: 69 %
PLATELETS: 596 10*3/uL — AB (ref 150–400)
RBC: 3.38 MIL/uL — ABNORMAL LOW (ref 4.22–5.81)
RDW: 12.5 % (ref 11.5–15.5)
WBC: 9.3 10*3/uL (ref 4.0–10.5)
nRBC: 0 % (ref 0.0–0.2)

## 2018-05-27 LAB — GLUCOSE, CAPILLARY
GLUCOSE-CAPILLARY: 105 mg/dL — AB (ref 70–99)
Glucose-Capillary: 99 mg/dL (ref 70–99)
Glucose-Capillary: 91 mg/dL (ref 70–99)
Glucose-Capillary: 98 mg/dL (ref 70–99)

## 2018-05-27 LAB — POCT I-STAT 3, ART BLOOD GAS (G3+)
Acid-Base Excess: 1 mmol/L (ref 0.0–2.0)
Bicarbonate: 23.3 mmol/L (ref 20.0–28.0)
O2 Saturation: 96 %
TCO2: 24 mmol/L (ref 22–32)
pCO2 arterial: 29.3 mmHg — ABNORMAL LOW (ref 32.0–48.0)
pH, Arterial: 7.508 — ABNORMAL HIGH (ref 7.350–7.450)
pO2, Arterial: 69 mmHg — ABNORMAL LOW (ref 83.0–108.0)

## 2018-05-27 LAB — BASIC METABOLIC PANEL
Anion gap: 10 (ref 5–15)
BUN: 18 mg/dL (ref 8–23)
CO2: 26 mmol/L (ref 22–32)
Calcium: 8.5 mg/dL — ABNORMAL LOW (ref 8.9–10.3)
Chloride: 108 mmol/L (ref 98–111)
Creatinine, Ser: 0.63 mg/dL (ref 0.61–1.24)
GFR calc Af Amer: >60 mL/min (ref >60)
GFR calc non Af Amer: >60 mL/min (ref >60)
Glucose, Bld: 116 mg/dL — ABNORMAL HIGH (ref 70–99)
Potassium: 3.3 mmol/L — ABNORMAL LOW (ref 3.5–5.1)
Sodium: 144 mmol/L (ref 135–145)

## 2018-05-27 LAB — PHOSPHORUS: Phosphorus: 2.9 mg/dL (ref 2.5–4.6)

## 2018-05-27 LAB — MAGNESIUM: Magnesium: 2.2 mg/dL (ref 1.7–2.4)

## 2018-05-27 MED ORDER — PANCRELIPASE (LIP-PROT-AMYL) 12000-38000 UNITS PO CPEP
24000.0000 [IU] | ORAL_CAPSULE | Freq: Once | ORAL | Status: DC
  Filled 2018-05-27: qty 2

## 2018-05-27 MED ORDER — DEXTROSE 5 % IV SOLN: INTRAVENOUS | Status: DC

## 2018-05-27 MED ORDER — PANCRELIPASE (LIP-PROT-AMYL) 10440-39150 UNITS PO TABS
20880.0000 [IU] | ORAL_TABLET | Freq: Once | ORAL | Status: DC
  Filled 2018-05-27: qty 2

## 2018-05-27 MED ORDER — PANCRELIPASE (LIP-PROT-AMYL) 10440-39150 UNITS PO TABS
20880.0000 [IU] | ORAL_TABLET | Freq: Once | ORAL | Status: AC
  Administered 2018-05-27: 20880 [IU]
  Filled 2018-05-27: qty 2

## 2018-05-27 MED ORDER — SODIUM BICARBONATE 650 MG PO TABS
650.0000 mg | ORAL_TABLET | Freq: Once | ORAL | Status: AC
  Administered 2018-05-27: 650 mg
  Filled 2018-05-27: qty 1

## 2018-05-27 NOTE — Progress Notes (Signed)
eLink Physician-Brief Progress Note Patient Name: [name] Pace DOB: 05/16/1951 MRN: [id number]   Date of Service  05/27/2018  HPI/Events of Note  Review of KUB reveals CorTrak appears to still be coiled back on itself.   eICU Interventions  Will order: 1. If not able to flush, would D/C CorTrak and have it replaced in AM.      Intervention Category Major Interventions: Other:  Lysle Dingwall 05/27/2018, 9:39 PM

## 2018-05-27 NOTE — Progress Notes (Signed)
NAME:  [name] Pace, MRN:  [id number], DOB:  Aug 27, 1950, LOS: 63 ADMISSION DATE:  05/13/2018, CONSULTATION DATE:  05/20/18 REFERRING MD:  Dr. Earnest Conroy, CHIEF COMPLAINT:  Tachypnea    Brief History   67 y/o M, smoker, admitted 12/8 after a fall with right sided chest pain.  Work up revealed multiple right sided rib fractures and small pneumothorax which did not require a chest tube.  PCCM called to see the patient  12/13 for tachypnea and increasing O2 needs. At that time, CXR showed resolution of pneumothorax.  ABG 7.45 / 36 / 88.     Past Medical History  Breast cancer  CVA Prostate cancer  Anxiety  ETOH abuse  HLD   Significant Hospital Events   12/08 Admit after fall with rib fractures, small PTX 12/09 CIWA protocol for ETOH withdrawal 12/13 Rapid response PCCM consulted  12/14 Precedex changed to Fentanyl for pain control 12/15- RN reports aline non-functional.  Levophed at 8 mcg, fentanyl at 429mcg's. 12/16-  precedex started this morning due to agitation despite dilaudid gtt => helping / + 14L since admit. Off levophed. On TF. Getting 200cc/h saline stopped/ diuresis started  12/17 -  remains on precedex 0.3 and dilaudid gtt at 2-4 mg/hr.  RN states patient wakes up panicked and gets combative.   -3L diuresis from yesterday   12/18 - s/p Rt thora 46mL clear fluid. Exudate by LDH with 55% lympos and 25% polys and 8% eos (cytology pending)  -> self extubated - > very weak voice and diffusely weak, 5L [place] and pulse ox 86%, very weak cough. Oriented (off dilaudid gtt) and wants to go home   12/19 - remains extubated. Never needed percedex. But needs sitter. Asked for heroin v dilaudid opioids once (brother died of opioid use). Very deconditioned - high potential SNF patient but family concerned he will sign out AMA. Pulse ox 92% on 5LNC currently.  Noted: -  RN report: patient verbally abusive using terms such as "foreigner" to the sitter. CAM-ICU mildly positive for delirium but  improved since yesterday. Sitter being replaced as patient gets more agitated in presence of sitter. - Able to take po per RN - also ongoing pain from rib fractures  - moprhine 1mg  prn not helping  12/21: currently on precedex. 12/22: precedex discontinued last night.  Consults:  PCCM 12/13   Procedures:  R IJ ? Date > out 05/25/18 PICC placed 05/25/18    Significant Diagnostic Tests:  CT Chest 12/8 >> multiple mildly displaced right rib fractures 5-11th ribs, minimal right apical pneumothorax, rounded structure in the anterior mediastinum, likely a saccular aneurysm CT Chest 12/14 >> no PTX, moderate bilateral pleural effusions with adjacent atelectasis, multiple moderately displaced rib fractures with overlying sq emphysema of right chest wall, 2.4 cm rounded abnormality to the left of the ascending thoracid aorta which is concerning for possible aneurysm   Micro Data:  UA 12/12 >> many bacteria, nitrite +, small leukocytes, 21-50 WBC Trach aspirate 12/13 - strep pneumo Urine strep 12/19 - neg Urine leg 12/19 -   Antimicrobials:  Levaquin 12/13 >> 12/21 (7d course, pneumococcus) Flagyl 12/13  - ? Stop date  Interim history/subjective:   12/20 - precedex stopped due to HR 40s. Yesterday PM combo of haldol prn, precedex gtt and ativan prn seemed to help take edge off. Today AM - worse agitation and confusion. QTC ok on ekg but possible lateral wall changes + . Social: cocaine, heroin and etoh -  Addict;  lives off trust fund per report  Objective   Blood pressure 95/79, pulse (!) 113, temperature 97.7 F (36.5 C), temperature source Axillary, resp. rate (!) 36, height 6\' 2"  (1.88 m), weight 67.2 kg, SpO2 97 %. CVP:  [5 mmHg-15 mmHg] 11 mmHg      Intake/Output Summary (Last 24 hours) at 05/27/2018 1454 Last data filed at 05/27/2018 1400 Gross per 24 hour  Intake 2055.11 ml  Output 1610 ml  Net 445.11 ml   Filed Weights   05/26/18 0000 05/26/18 0500 05/27/18 0628  Weight: 66  kg 66 kg 67.2 kg     General Appearance:  Looks critically ill, deconditioned Head:  Normocephalic, without obvious abnormality, atraumatic Eyes:  PERRL - yes, conjunctiva/corneas - clear     Ears:  Normal external ear canals, both ears Nose:  G tube - yes in the right nares Throat:  ETT TUBE - no , OG tube - no Neck:  Supple,  No enlargement/tenderness/nodules Lungs: Clear to auscultation bilaterally, no wheezes, crackles or rhonchi. Heart:  S1 and S2 normal, no murmur, CVP - no.  Pressors - no Abdomen:  Soft, no masses, no organomegaly Genitalia / Rectal:  Deferred. Extremities:  Extremities- intact Skin:  intact in exposed areas . Sacral area - not examined Neurologic:  Sedation -precedex -> RASS - +2 . Moves all 4s - yes. CAM-ICU - positive . Orientation - only to self. No change today.     LABS    PULMONARY Recent Labs  Lab 05/23/18 1159  PHART 7.449  PCO2ART 43.5  PO2ART 61.0*  HCO3 30.2*  TCO2 31  O2SAT 92.0    CBC Recent Labs  Lab 05/25/18 0446 05/26/18 0512 05/27/18 0434  HGB 10.9* 11.1* 11.7*  HCT 33.2* 33.7* 35.6*  WBC 8.0 7.2 9.3  PLT 455* 541* 596*    COAGULATION No results for input(s): INR in the last 168 hours.  CARDIAC   Recent Labs  Lab 05/25/18 1036 05/25/18 1540 05/25/18 2041 05/26/18 0512  TROPONINI <0.03 <0.03 <0.03 <0.03   No results for input(s): PROBNP in the last 168 hours.   CHEMISTRY Recent Labs  Lab 05/21/18 2306  05/23/18 0339 05/23/18 0340 05/24/18 0438 05/25/18 0446 05/25/18 1036 05/25/18 2350 05/26/18 0512 05/26/18 0753 05/27/18 0434  NA 140  --   --  137  --   --  141 141  --   --  144  K 4.5  --   --  4.6  --   --  3.3* 2.7*  --  3.6 3.3*  CL 107  --   --  101  --   --  95* 96*  --   --  108  CO2 24  --   --  28  --   --  29 33*  --   --  26  GLUCOSE 97  --   --  108*  --   --  99 166*  --   --  116*  BUN 17  --   --  21  --   --  14 16  --   --  18  CREATININE 0.60*  --   --  0.71  --   --  0.90  0.60*  --   --  0.63  CALCIUM 7.9*  --   --  8.1*  --   --  8.4* 8.3*  --   --  8.5*  MG  --    < > 2.0  --  1.9 1.8  --   --  2.4  --  2.2  PHOS  --    < >  --  5.7* 4.0 3.7  --   --  2.9  --  2.9   < > = values in this interval not displayed.   Estimated Creatinine Clearance: 87.5 mL/min (by C-G formula based on SCr of 0.63 mg/dL).   LIVER Recent Labs  Lab 05/22/18 0502 05/23/18 0340  AST 36  --   ALT 15  --   ALKPHOS 45  --   BILITOT 1.0  --   PROT 4.7*  --   ALBUMIN 1.5* 1.6*     INFECTIOUS Recent Labs  Lab 05/21/18 2306  LATICACIDVEN 1.1     ENDOCRINE CBG (last 3)  Recent Labs    05/27/18 0346 05/27/18 0758 05/27/18 1135  GLUCAP 105* 99 91    IMAGING x48h  - image(s) personally visualized  -   highlighted in bold Dg Chest Port 1 View  Result Date: 05/27/2018 CLINICAL DATA:  Increased shortness of breath, history breast cancer, coronary artery disease, prostate cancer EXAM: PORTABLE CHEST 1 VIEW COMPARISON:  Portable exam 1316 hours compared to 05/26/2018 FINDINGS: RIGHT arm PICC line tip projects over SVC. Feeding tube extends into stomach. Borderline enlargement of cardiac silhouette post CABG. Mediastinal contours and pulmonary vascularity normal. Persistent RIGHT pleural effusion and basilar atelectasis versus consolidation. LEFT lung clear. No pneumothorax. Bones demineralized. IMPRESSION: Persistent RIGHT pleural effusion and basilar atelectasis versus consolidation. Borderline enlargement of cardiac silhouette post CABG. Electronically Signed   By: Lavonia Dana M.D.   On: 05/27/2018 13:41   Dg Chest Port 1 View  Result Date: 05/26/2018 CLINICAL DATA:  Acute onset of respiratory distress. Agitation. EXAM: PORTABLE CHEST 1 VIEW COMPARISON:  Chest radiograph performed 05/23/2018 FINDINGS: A small right pleural effusion is noted. Hazy right-sided and left basilar airspace opacification raises concern for pneumonia, though asymmetric pulmonary edema might have a  similar appearance. No pneumothorax is seen. The cardiomediastinal silhouette is mildly enlarged. The patient is status post median sternotomy, with evidence of prior CABG. An enteric tube is noted extending overlying the body of the stomach. A right PICC is noted ending about the mid SVC. Postoperative change is noted at the right glenoid, with chronic loss of the right glenohumeral joint space. No acute osseous abnormalities are identified. IMPRESSION: 1. Small right pleural effusion noted. Hazy right-sided and left basilar airspace opacification raises concern for pneumonia, though asymmetric pulmonary edema might have a similar appearance. 2. Mild cardiomegaly. Electronically Signed   By: Garald Balding M.D.   On: 05/26/2018 23:20   Resolved Hospital Problem list    pneumohtorax Septic shock  Assessment & Plan:   Mechanical Fall with Multiple Rib Fractures Severe Pain with agitated delirium from pain and etoh withdrawal and hx of CVA  05/27/2018 - ongoing delirium agitation slowly worsening post extubation. Precedex resumed; no hypotension or bradycardia. P: Continue fentanyl prn Change seroquel to haldol scheduled ( QTc 450s  05/24/18 on EKG) Continue to hold off  Gabapentin , oxy IR and klonopin - can add judiciously if needed lidoderm patch to area to continue Sitter to continue  Continue daily ASA/ thiamine/ folate CT Head when able.  Acute Hypoxic Respiratory Failure - s/p self extubation 05/23/18 -in setting multiple rib fractures, atelectasis, pneumothorax,   -? Layering effusion vs hemithorax given multiple rib fx -  12/18  - s/p thora 12/18 - 450cc removed . Exudate. Cytology show reactive  atypical mesothelial cells.   On 05/25/18 = remains extubated but still needs  o2 - 5L [place] though distress less.   P: - [place] for pulse ox > 88% - pulm toilet - check cxr prior to dc     Pneumothorax  P: Resolved, monitor    Abnormal ECHO 05/21/18 -  suggestive of mild cor  pulmonale  12/20 - EKG changes + lateral wall  Plan  - cardiac troponins negative x 3  Shock   -suspect component sepsis and sedation effects; since resolved P: Goal MAP >65   Sepsis  - initial concern of aspiration v UTI. On 12/19 - trach culture +ve for strep pneumoniae  - afebrile as of 05/24/18 atleast since 05/18/18  P: levaquin 2 more days from 05/24/18 to complete 7d course (QTC on ekg 05/24/18 - < 551msec))   Volume overload - +11.6L as of 05/21/18   - on 05/25/18 with lasix down tto Net +2.85L - BUn/Creat and K toleating  P:  Reduce lasix 80mg  q 8hr  To 40mg  IV Q12h Trend renal function/ electrolytes   ELectrolyte imbalance - low mag 05/25/18 - repleted - monitor K and lytes with Lsix  MSK  A: Physical deconditioning P: PT/OT   Best practice:  Diet: enteral feeding. Pain/Anxiety/Delirium protocol (if indicated): see CNS Section VAP protocol (if indicated): NA DVT prophylaxis: scd's  GI prophylaxis: pepcid  Glucose control: n/a  Mobility: bedrest  Code Status: Full code  Family Communication: Daughter updated at bedside 05/20/18 and 12/22. None at bedside 05/21/18 or 12/17 or 05/23/18 or 05/24/18 or 05/25/18 or 05/26/18 Disposition:  Keep in ICU Social: Substance abuser. Not in touch with daughter. High risk for declining SNF services  SERVICE ;Given to [facility] for 05/25/18 but with precedex need = pccm now primary      ATTESTATION & SIGNATURE   The patient ABEL HAGEMAN is critically ill with multiple organ systems failure and requires high complexity decision making for assessment and support, frequent evaluation and titration of therapies, application of advanced monitoring technologies and extensive interpretation of multiple databases.   Critical Care Time devoted to patient care services described in this note is  30  Minutes. This time reflects time of care of this signee Dr Salley Scarlet. This critical care time does not reflect procedure  time, or teaching time or supervisory time of PA/NP/Med student/Med Resident etc but could involve care discussion time     Salley Scarlet, M.D., F.C.C.P Pulmonary and Critical Care Medicine Staff Physician [facility] Pager:  call [phone]  05/27/2018 2:54 PM

## 2018-05-27 NOTE — Progress Notes (Signed)
Tube feedings held due to clogged cortrak tube. Multiple strategies and attempts by myself and other RNs to unclog including clogged nasogastric tube protocol order set. DG Port Abdomen KUB ordered to verify placement. Cortrak team is not staffed on Sundays to troubleshoot further. Will pass in report to contact them tomorrow morning.

## 2018-05-27 NOTE — Plan of Care (Signed)
Patient having lower blood pressure with systolics in 67O and MAPS between 55-70s. Awake and alert but delirious. No LE edema. O2 sat 100% on 3L. Tachypnea in the 30s.   CXR 12/21 with small right pleural effusion. Hazy right-sided and left basilar airspace opacification.  Precedex stopped. Lasix held Given 510ml LR bolus with improvement of his BP to systolic of 14D and MAPs of 80s Discussed with RN giving scheduled Seroquel.   Jacques Earthly, M.D. [facility] Pulmonary/Critical Care Medicine After hours pager: [phone]

## 2018-05-27 NOTE — Progress Notes (Signed)
eLink Physician-Brief Progress Note Patient Name: [name] Pace DOB: 1951-01-21 MRN: [id number]   Date of Service  05/27/2018  HPI/Events of Note  Multiple issues: 1. Nursing concerned about RR = 32 and 2. Blood glucose = 96 - tube feeds held d/t issue with CorTrak.   eICU Interventions  Will order: 1. ABG STAT. 2. Portable CXR STAT. 3. Will ask ground team to evaluate at bedside.  4. D5W to run IV at 40 mL/hour.      Intervention Category Major Interventions: Other:  Lysle Dingwall 05/27/2018, 8:29 PM

## 2018-05-27 NOTE — Progress Notes (Signed)
SLP Cancellation Note  Patient Details Name: [name] Pace MRN: [id number] DOB: May 20, 1951   Cancelled treatment:       Reason Eval/Treat Not Completed: Medical issues which prohibited therapy. Pt remains agitated and inappropriate for SLP evaluation at this time. Will continue efforts. Deneise Lever, [place], CCC-SLP Speech-Language Pathologist Acute Rehabilitation Services Pager: [phone] Office: [phone]    Aliene Altes 05/27/2018, 11:32 AM

## 2018-05-27 NOTE — Progress Notes (Signed)
05/27/2018 2045 hrs. Sent to bedside per a late for tachypnea and nursing concerns of instability.  General: Cachectic frail male with obvious confusion. HEENT: No JVD or lymphadenopathy is appreciated Neuro: Reports he knows where he is, wants to go home.  Agitated at times follows commands intermittently. CV: Sounds are distant PULM: even/non-labored, lungs bilaterally rhonchi bilaterally But with a respiratory rate varying from 26-34 TD:DUKG, non-tender, bsx4 active  Extremities: warm/dry, negative edema, muscle loss noted Skin: no rashes or lesions   ABG    Component Value Date/Time   PHART 7.508 (H) 05/27/2018 2047   PCO2ART 29.3 (L) 05/27/2018 2047   PO2ART 69.0 (L) 05/27/2018 2047   HCO3 23.3 05/27/2018 2047   TCO2 24 05/27/2018 2047   ACIDBASEDEF 2.0 05/19/2018 0451   O2SAT 96.0 05/27/2018 2047  Chest x-ray pending   Recent Labs  Lab 05/25/18 1036 05/25/18 2350 05/26/18 0753 05/27/18 0434  NA 141 141  --  144  K 3.3* 2.7* 3.6 3.3*  CL 95* 96*  --  108  CO2 29 33*  --  26  BUN 14 16  --  18  CREATININE 0.90 0.60*  --  0.63  GLUCOSE 99 166*  --  116*   Recent Labs  Lab 05/25/18 0446 05/26/18 0512 05/27/18 0434  HGB 10.9* 11.1* 11.7*  HCT 33.2* 33.7* 35.6*  WBC 8.0 7.2 9.3  PLT 455* 541* 596*   Ongoing delirium and agitation post extubation.  His respiratory rate is elevated but he is blown his carbon dioxide off down to 29 pH is 7.50.  He did not in respiratory distress acutely.  Chest x-ray is pending I do not see where he needs to be intubated or any other interventions at this time. Dr. Oletta Darter and Dr. Gilford Raid aware of her current situation.   Richardson Landry Jonell Brumbaugh ACNP [facility] PCCM Pager [phone] till 1 pm If no answer page [phone] 05/27/2018, 9:03 PM

## 2018-05-28 ENCOUNTER — Inpatient Hospital Stay (HOSPITAL_COMMUNITY): Payer: Medicare Other

## 2018-05-28 DIAGNOSIS — G9340 Encephalopathy, unspecified: Secondary | ICD-10-CM

## 2018-05-28 LAB — BASIC METABOLIC PANEL
Anion gap: 11 (ref 5–15)
BUN: 14 mg/dL (ref 8–23)
CO2: 22 mmol/L (ref 22–32)
Calcium: 8.5 mg/dL — ABNORMAL LOW (ref 8.9–10.3)
Chloride: 111 mmol/L (ref 98–111)
Creatinine, Ser: 0.52 mg/dL — ABNORMAL LOW (ref 0.61–1.24)
GFR calc Af Amer: >60 mL/min (ref >60)
GFR calc non Af Amer: >60 mL/min (ref >60)
Glucose, Bld: 122 mg/dL — ABNORMAL HIGH (ref 70–99)
Potassium: 3.4 mmol/L — ABNORMAL LOW (ref 3.5–5.1)
Sodium: 144 mmol/L (ref 135–145)

## 2018-05-28 LAB — CBC WITH DIFFERENTIAL/PLATELET
Abs Immature Granulocytes: 0.03 10*3/uL (ref 0.00–0.07)
BASOS PCT: 1 %
Basophils Absolute: 0.1 10*3/uL (ref 0.0–0.1)
EOS ABS: 0.3 10*3/uL (ref 0.0–0.5)
Eosinophils Relative: 4 %
HCT: 35.5 % — ABNORMAL LOW (ref 39.0–52.0)
Hemoglobin: 11.8 g/dL — ABNORMAL LOW (ref 13.0–17.0)
Immature Granulocytes: 0 %
Lymphocytes Relative: 14 %
Lymphs Abs: 1.3 10*3/uL (ref 0.7–4.0)
MCH: 35.3 pg — ABNORMAL HIGH (ref 26.0–34.0)
MCHC: 33.2 g/dL (ref 30.0–36.0)
MCV: 106.3 fL — ABNORMAL HIGH (ref 80.0–100.0)
Monocytes Absolute: 0.9 10*3/uL (ref 0.1–1.0)
Monocytes Relative: 10 %
Neutro Abs: 6.6 10*3/uL (ref 1.7–7.7)
Neutrophils Relative %: 71 %
Platelets: 624 10*3/uL — ABNORMAL HIGH (ref 150–400)
RBC: 3.34 MIL/uL — ABNORMAL LOW (ref 4.22–5.81)
RDW: 12.7 % (ref 11.5–15.5)
WBC: 9.3 10*3/uL (ref 4.0–10.5)
nRBC: 0 % (ref 0.0–0.2)

## 2018-05-28 LAB — CULTURE, BODY FLUID W GRAM STAIN -BOTTLE: Culture: NO GROWTH

## 2018-05-28 LAB — PROTEIN, TOTAL: Total Protein: 6.1 g/dL — ABNORMAL LOW (ref 6.5–8.1)

## 2018-05-28 LAB — GLUCOSE, CAPILLARY
GLUCOSE-CAPILLARY: 110 mg/dL — AB (ref 70–99)
GLUCOSE-CAPILLARY: 111 mg/dL — AB (ref 70–99)
Glucose-Capillary: 103 mg/dL — ABNORMAL HIGH (ref 70–99)
Glucose-Capillary: 87 mg/dL (ref 70–99)
Glucose-Capillary: 101 mg/dL — ABNORMAL HIGH (ref 70–99)
Glucose-Capillary: 109 mg/dL — ABNORMAL HIGH (ref 70–99)
Glucose-Capillary: 96 mg/dL (ref 70–99)

## 2018-05-28 LAB — LACTATE DEHYDROGENASE: LDH: 208 U/L — ABNORMAL HIGH (ref 98–192)

## 2018-05-28 LAB — PHOSPHORUS: Phosphorus: 3.3 mg/dL (ref 2.5–4.6)

## 2018-05-28 LAB — MAGNESIUM: Magnesium: 2 mg/dL (ref 1.7–2.4)

## 2018-05-28 MED ORDER — POTASSIUM CHLORIDE 10 MEQ/100ML IV SOLN
10.0000 meq | INTRAVENOUS | Status: AC
  Administered 2018-05-28 (×4): 10 meq via INTRAVENOUS
  Filled 2018-05-28 (×4): qty 100

## 2018-05-28 MED ORDER — NICOTINE 14 MG/24HR TD PT24
14.0000 mg | MEDICATED_PATCH | Freq: Every day | TRANSDERMAL | Status: DC
  Administered 2018-05-28 – 2018-06-09 (×12): 14 mg via TRANSDERMAL
  Filled 2018-05-28 (×13): qty 1

## 2018-05-28 NOTE — Progress Notes (Signed)
Physical Therapy Treatment Patient Details Name: [name] Pace MRN: [id number] DOB: 04-Dec-1950 Today's Date: 05/28/2018    History of Present Illness Patient is a 67 year old male with history of alcohol abuse, CAD, history of CVA presented to ED with severe right-sided chest wall pain after a fall on the night of 12/7. Pt with R rib fxs 5-11. Intubated 12/13, self extubated 12/18.     PT Comments    Patient seen for activity progression. Limited to bed level/EOB today due to increased agitation. Patient extremely restless. Able to come to EOB with max encouragement but once EOB agitation increased and patient assisted back to supine. Patient confused throughout session. Current POC remains appropriate.   Follow Up Recommendations  SNF;Supervision/Assistance - 24 hour     Equipment Recommendations  Other (comment)(TBA)    Recommendations for Other Services       Precautions / Restrictions Precautions Precautions: Fall Precaution Comments: pt fell in 2016 with hip fx s/p IM nail Restrictions Weight Bearing Restrictions: No    Mobility  Bed Mobility Overal bed mobility: Needs Assistance Bed Mobility: Supine to Sit;Sit to Supine     Supine to sit: Mod assist Sit to supine: Mod assist   General bed mobility comments: mod assist to come to EOB, max cues for encouragement and deflection due to agitation with activity  Transfers                 General transfer comment: unsafe to perform today  Ambulation/Gait                 Stairs             Wheelchair Mobility    Modified Rankin (Stroke Patients Only)       Balance Overall balance assessment: Needs assistance;History of Falls Sitting-balance support: Feet supported Sitting balance-Leahy Scale: Fair     Standing balance support: Bilateral upper extremity supported Standing balance-Leahy Scale: Poor                              Cognition Arousal/Alertness:  Awake/alert Behavior During Therapy: Restless;Impulsive Overall Cognitive Status: Impaired/Different from baseline Area of Impairment: Memory;Safety/judgement;Awareness                     Memory: Decreased short-term memory   Safety/Judgement: Decreased awareness of safety Awareness: Emergent   General Comments: patient agitated with activity      Exercises      General Comments        Pertinent Vitals/Pain Pain Assessment: Faces Faces Pain Scale: Hurts even more Pain Descriptors / Indicators: Grimacing Pain Intervention(s): Monitored during session    Home Living                      Prior Function            PT Goals (current goals can now be found in the care plan section) Acute Rehab PT Goals Patient Stated Goal: "get out of bed." PT Goal Formulation: With patient Time For Goal Achievement: 06/06/18 Potential to Achieve Goals: Fair Progress towards PT goals: Not progressing toward goals - comment    Frequency    Min 2X/week      PT Plan Current plan remains appropriate    Co-evaluation              AM-PAC PT "6 Clicks" Mobility   Outcome Measure  Help needed turning from  your back to your side while in a flat bed without using bedrails?: A Little Help needed moving from lying on your back to sitting on the side of a flat bed without using bedrails?: A Little Help needed moving to and from a bed to a chair (including a wheelchair)?: A Lot Help needed standing up from a chair using your arms (e.g., wheelchair or bedside chair)?: A Lot Help needed to walk in hospital room?: Total Help needed climbing 3-5 steps with a railing? : Total 6 Click Score: 12    End of Session Equipment Utilized During Treatment: Gait belt Activity Tolerance: Patient tolerated treatment well Patient left: in bed;with call bell/phone within reach;with nursing/sitter in room;with restraints reapplied Nurse Communication: Mobility status PT Visit  Diagnosis: Unsteadiness on feet (R26.81);Repeated falls (R29.6);Difficulty in walking, not elsewhere classified (R26.2);Muscle weakness (generalized) (M62.81);Pain Pain - part of body: (ribs)     Time: 2876-8115 PT Time Calculation (min) (ACUTE ONLY): 15 min  Charges:  $Therapeutic Activity: 8-22 mins                     Alben Deeds, PT DPT  Board Certified Neurologic Specialist [place] Pager [phone] Office [place] 05/28/2018, 1:26 PM

## 2018-05-28 NOTE — Care Management Note (Addendum)
Case Management Note  Patient Details  Name: MYKEL SPONAUGLE MRN: [id number] Date of Birth: [birth year]  Subjective/Objective:  67 yo male presented with chief complaint of fall with severe right CP which revealed multiple right sided rib fractures and small pneumothorax.   Action/Plan: CM consult acknowledged for [facility] referral. Patient is being followed by the CSW for ST SNF placement. Patient has [facility] Medicare, with case discussed by CM with the [facility] liaison. Per [place] liaison, with [facility] Medicare and patient tolerating O2, no wound management, IV ABT or critical needs at this time, insurance would not approve an LTACH, but patient/family would need to consider ST SNF. CM team will continue to follow.  Expected Discharge Date:  05/17/18               Expected Discharge Plan:  Skilled Nursing Facility  In-House Referral:  Clinical Social Work  Discharge planning Services  CM Consult  Post Acute Care Choice:  NA Choice offered to:  NA  DME Arranged:  N/A DME Agency:  NA  HH Arranged:  NA HH Agency:  NA  Status of Service:  In process, will continue to follow  If discussed at Long Length of Stay Meetings, dates discussed:    Additional Comments: 06/01/18 @ 1514-Dajsha Massaro RNCM-CM consult for [facility] referral acknowledged. Patient still does not meet criteria for an LTACH, the case was previously reviewed by an [facility] liaison with no critical needs for insurance approval. CM team will continue to follow.  Midge Minium RN, BSN, NCM-BC, ACM-RN [phone] 05/28/2018, 1:17 PM

## 2018-05-28 NOTE — Progress Notes (Signed)
On call MD notified and updated regarding abdominal xray results and CorTrak tube placement. Order obtained to pull back NG tube 8 cm and recheck abdominal xray. Advised to start D5W if unable to start tube feedings to prevent hypoglycemia with tube feedings being stopped. Orders received and carried out.   MD also informed of concern for patients respiratory status with sustained respiratory rate greater than 30. Further orders received for ABG, Chest Xray, and advised for MD to assess patient at bedside. Orders received and carried out.

## 2018-05-28 NOTE — Progress Notes (Signed)
On call MD notified and updated regarding patient hypotensive with SBP 75-80s and respiratory rate 30+. Precedex infusion stopped due to hypotension and patient mostly cooperative with very mild restlessness. Patient maintaining oxygen saturations via 3L nasal cannula. Lung sounds with bilateral upper lobes clear and diminished bilateral lower lobes. MD to assess patient at bedside; further orders received and carried out.

## 2018-05-28 NOTE — Progress Notes (Signed)
NAME:  [name] Pace, MRN:  [id number], DOB:  [birth year], LOS: 11 ADMISSION DATE:  05/13/2018, CONSULTATION DATE:  05/20/18 REFERRING MD:  Dr. Earnest Conroy, CHIEF COMPLAINT:  Tachypnea    Brief History   67 y/o M, smoker, admitted 12/8 after a fall with right sided chest pain.  Work up revealed multiple right sided rib fractures and small pneumothorax which did not require a chest tube.  PCCM called to see the patient  12/13 for tachypnea and increasing O2 needs.   Past Medical History  Breast cancer  CVA Prostate cancer  Anxiety  ETOH abuse  HLD   Significant Hospital Events   12/08 Admit after fall with rib fractures, small PTX 12/09 CIWA protocol for ETOH withdrawal 12/13 Rapid response PCCM consulted: intubated 12/14 Precedex changed to Fentanyl for pain control 12/15-  Levophed at 8 mcg, fentanyl at 454mcg's. 12/16-  precedex started this morning due to agitation despite dilaudid gtt => helping / + 14L since admit. Off levophed. On TF. Getting 200cc/h saline stopped/ diuresis started 12/17 -  remains on precedex 0.3 and dilaudid gtt at 2-4 mg/hr.  RN states patient wakes up panicked and gets combative.   12/18 - s/p Rt thora 439mL clear fluid. Exudate by LDH with 55% lympos and 25% polys and 8% eos (cytology pending)  -> self extubated - > very weak voice and diffusely weak, 5L [place] and pulse ox 86%, very weak cough. Oriented (off dilaudid gtt) and wants to go home   12/19 - remains extubated. Never needed percedex. But needs sitter. Asked for heroin v dilaudid opioids once (brother died of opioid use). Very deconditioned - high potential SNF patient but family concerned he will sign out AMA. Pulse ox 92% on 5LNC currently.  Noted: -  RN report: patient verbally abusive using terms such as "foreigner" to the sitter. CAM-ICU mildly positive for delirium but improved since yesterday. Sitter being replaced as patient gets more agitated in presence of sitter. - Able to take po per RN - also  ongoing pain from rib fractures  - moprhine 1mg  prn not helping  12/21: currently on precedex. 12/22: precedex discontinued last night.  Consults:  PCCM 12/13   Procedures:  R IJ ? Date > out 05/25/18 PICC placed 05/25/18    Significant Diagnostic Tests:  CT Chest 12/8 >> multiple mildly displaced right rib fractures 5-11th ribs, minimal right apical pneumothorax, rounded structure in the anterior mediastinum, likely a saccular aneurysm CT Chest 12/14 >> no PTX, moderate bilateral pleural effusions with adjacent atelectasis, multiple moderately displaced rib fractures with overlying sq emphysema of right chest wall, 2.4 cm rounded abnormality to the left of the ascending thoracid aorta which is concerning for possible aneurysm  Echo 12/16 > The cavity size was normal. Wall thickness was normal. Systolic function was normal. The estimated ejection fraction was in the range of 50% to 55%. RV and RA with moderate dilation.   Micro Data:  UA 12/12 >> many bacteria, nitrite +, small leukocytes, 21-50 WBC Trach aspirate 12/13 - strep pneumo Urine strep 12/19 - neg Urine leg 12/19 - neg  Antimicrobials:  Levaquin 12/13 >> 12/19 Flagyl 12/13    Interim history/subjective:   Ongoing delirium overnight and this morning. R effusion re-accumulating.   Objective   Blood pressure 100/79, pulse 84, temperature 98.9 F (37.2 C), temperature source Oral, resp. rate (!) 32, height 6\' 2"  (1.88 m), weight 67.2 kg, SpO2 99 %. CVP:  [11 mmHg] 11 mmHg  Intake/Output Summary (Last 24 hours) at 05/28/2018 1122 Last data filed at 05/28/2018 1100 Gross per 24 hour  Intake 1855 ml  Output 860 ml  Net 995 ml   Filed Weights   05/26/18 0000 05/26/18 0500 05/27/18 0628  Weight: 66 kg 66 kg 67.2 kg    General:  Frail elderly appearing male  Neuro:  Awake, disoriented. No meaningful response or command following. Agitated.  HEENT:  [place]/AT, No JVD noted, PERRL Cardiovascular:  RRR, no MRG Lungs:   Diminished R.  Abdomen:  Soft, non-distended, non-tender Musculoskeletal:  No acute deformity or ROM limitation Skin:  Intact, MMM  LABS    PULMONARY Recent Labs  Lab 05/23/18 1159 05/27/18 2047  PHART 7.449 7.508*  PCO2ART 43.5 29.3*  PO2ART 61.0* 69.0*  HCO3 30.2* 23.3  TCO2 31 24  O2SAT 92.0 96.0    CBC Recent Labs  Lab 05/26/18 0512 05/27/18 0434 05/28/18 0518  HGB 11.1* 11.7* 11.8*  HCT 33.7* 35.6* 35.5*  WBC 7.2 9.3 9.3  PLT 541* 596* 624*    COAGULATION No results for input(s): INR in the last 168 hours.  CARDIAC   Recent Labs  Lab 05/25/18 1036 05/25/18 1540 05/25/18 2041 05/26/18 0512  TROPONINI <0.03 <0.03 <0.03 <0.03   No results for input(s): PROBNP in the last 168 hours.   CHEMISTRY Recent Labs  Lab 05/23/18 0340 05/24/18 0438 05/25/18 0446 05/25/18 1036 05/25/18 2350 05/26/18 0512  05/27/18 0434 05/28/18 0518  NA 137  --   --  141 141  --   --  144 144  K 4.6  --   --  3.3* 2.7*  --    < > 3.3* 3.4*  CL 101  --   --  95* 96*  --   --  108 111  CO2 28  --   --  29 33*  --   --  26 22  GLUCOSE 108*  --   --  99 166*  --   --  116* 122*  BUN 21  --   --  14 16  --   --  18 14  CREATININE 0.71  --   --  0.90 0.60*  --   --  0.63 0.52*  CALCIUM 8.1*  --   --  8.4* 8.3*  --   --  8.5* 8.5*  MG  --  1.9 1.8  --   --  2.4  --  2.2 2.0  PHOS 5.7* 4.0 3.7  --   --  2.9  --  2.9 3.3   < > = values in this interval not displayed.   Estimated Creatinine Clearance: 87.5 mL/min (A) (by C-G formula based on SCr of 0.52 mg/dL (L)).   LIVER Recent Labs  Lab 05/22/18 0502 05/23/18 0340  AST 36  --   ALT 15  --   ALKPHOS 45  --   BILITOT 1.0  --   PROT 4.7*  --   ALBUMIN 1.5* 1.6*     INFECTIOUS Recent Labs  Lab 05/21/18 2306  LATICACIDVEN 1.1     ENDOCRINE CBG (last 3)  Recent Labs    05/28/18 0008 05/28/18 0516 05/28/18 0727  GLUCAP 103* 109* 101*    IMAGING x48h  - image(s) personally visualized  -   highlighted  in bold Dg Abd 1 View  Result Date: 05/27/2018 CLINICAL DATA:  Nasogastric tube repositioning. EXAM: ABDOMEN - 1 VIEW COMPARISON:  Abdominal radiograph performed earlier today at 6:39 p.m.  FINDINGS: The patient's enteric tube is noted coiled at the antrum and body of the stomach. The visualized bowel gas pattern is grossly unremarkable. No acute osseous abnormalities are seen. The patient is status post median sternotomy. A small right pleural effusion is again noted, with overlying displaced rib fractures. IMPRESSION: Enteric tube noted coiled at the antrum and body of the stomach. Electronically Signed   By: Garald Balding M.D.   On: 05/27/2018 22:18   Dg Abd 1 View  Result Date: 05/27/2018 CLINICAL DATA:  NG tube placement EXAM: ABDOMEN - 1 VIEW COMPARISON:  None. FINDINGS: Esophageal tube is folded back upon itself over the stomach. There may be a kink in the distal tube. Elevated right diaphragm with pleural and parenchymal disease. Nonobstructed bowel-gas pattern. IMPRESSION: Esophageal tube is folded back upon itself distally with possible kink in the distal tube. The tip is positioned over the expected location of gastric body Electronically Signed   By: Donavan Foil M.D.   On: 05/27/2018 19:52   Dg Chest Port 1 View  Result Date: 05/28/2018 CLINICAL DATA:  Respiratory failure EXAM: PORTABLE CHEST 1 VIEW COMPARISON:  May 27, 2018 FINDINGS: Feeding tube tip is in the stomach. Central catheter tip is in the superior vena cava. No pneumothorax a. There is a moderate pleural effusion on the right with consolidation throughout the right mid and lower lung zones. There is mild upper lobe atelectatic change bilaterally. No consolidation on the left. There is cardiomegaly with pulmonary venous hypertension. Patient is status post coronary artery bypass grafting. A stent is noted in the left subclavian region medially, stable. IMPRESSION: Tube and catheter positions as described without  pneumothorax. Right pleural effusion with consolidation in the right mid and lower lung zones. Some of this consolidation is due to atelectasis. Pneumonia on the right is suspected, however. There is mild upper lobe atelectasis bilaterally. There is pulmonary vascular congestion. Postoperative changes are noted. Electronically Signed   By: Lowella Grip III M.D.   On: 05/28/2018 07:10   Dg Chest Port 1 View  Result Date: 05/27/2018 CLINICAL DATA:  Increased shortness of breath, history breast cancer, coronary artery disease, prostate cancer EXAM: PORTABLE CHEST 1 VIEW COMPARISON:  Portable exam 1316 hours compared to 05/26/2018 FINDINGS: RIGHT arm PICC line tip projects over SVC. Feeding tube extends into stomach. Borderline enlargement of cardiac silhouette post CABG. Mediastinal contours and pulmonary vascularity normal. Persistent RIGHT pleural effusion and basilar atelectasis versus consolidation. LEFT lung clear. No pneumothorax. Bones demineralized. IMPRESSION: Persistent RIGHT pleural effusion and basilar atelectasis versus consolidation. Borderline enlargement of cardiac silhouette post CABG. Electronically Signed   By: Lavonia Dana M.D.   On: 05/27/2018 13:41   Dg Chest Port 1 View  Result Date: 05/26/2018 CLINICAL DATA:  Acute onset of respiratory distress. Agitation. EXAM: PORTABLE CHEST 1 VIEW COMPARISON:  Chest radiograph performed 05/23/2018 FINDINGS: A small right pleural effusion is noted. Hazy right-sided and left basilar airspace opacification raises concern for pneumonia, though asymmetric pulmonary edema might have a similar appearance. No pneumothorax is seen. The cardiomediastinal silhouette is mildly enlarged. The patient is status post median sternotomy, with evidence of prior CABG. An enteric tube is noted extending overlying the body of the stomach. A right PICC is noted ending about the mid SVC. Postoperative change is noted at the right glenoid, with chronic loss of the right  glenohumeral joint space. No acute osseous abnormalities are identified. IMPRESSION: 1. Small right pleural effusion noted. Hazy right-sided and left basilar airspace  opacification raises concern for pneumonia, though asymmetric pulmonary edema might have a similar appearance. 2. Mild cardiomegaly. Electronically Signed   By: Garald Balding M.D.   On: 05/26/2018 23:20   Resolved Hospital Problem list    pneumohtorax Septic shock  Assessment & Plan:   Mechanical Fall with Multiple Rib Fractures Severe Pain with agitated delirium from pain and etoh withdrawal and hx of CVA. - Continue fentanyl prn - Haldol scheduled, PRN ativan - Continue to hold off  Gabapentin , oxy IR and klonopin - Lidoderm patch injured ribs - Sitter - Continue daily ASA/ thiamine/ folate - CT Head when able.  Acute Hypoxic Respiratory Failure -in setting multiple rib fractures, atelectasis, pnuemoccocal pneumonia, and pneumothorax. Initially required intubation, self extubated 12/18. Never required chest tube.   Course was then complicated by volume overload, but currently he is about even for the admission.  - supplemental O2 as needed to keep SpO2 > 92% - Completed course of Levaquin - Follow WBC and fever curve.   Layering effusion vs hemothorax given multiple rib fx -  12/18  - s/p thora 12/18 - 450cc removed . Exudate. Cytology show reactive atypical mesothelial cells. CXR with more dense opacification of R base today.  - Follow CXR intermittently.  - Will ask IR to repeat thoracentesis.   Electrolyte imbalance - low mag 05/25/18 - repleted - monitor K and lytes with Lsix  Physical deconditioning -PT/OT -LTACH consult   Best practice:  Diet: enteral feeding. Pain/Anxiety/Delirium protocol (if indicated): see CNS Section VAP protocol (if indicated): NA DVT prophylaxis: scd's  GI prophylaxis: pepcid  Glucose control: n/a  Mobility: bedrest  Code Status: Full code  Family Communication: Daughter  updated at bedside 12/22 No family present today. Disposition:  Keep in ICU Social: Substance abuser. Not in touch with daughter. High risk for declining SNF services  Georgann Housekeeper, Annie [facility] [place] Pager [phone] or [phone]  05/28/2018 12:04 PM

## 2018-05-28 NOTE — Plan of Care (Signed)
  Problem: Clinical Measurements: Goal: Will remain free from infection Outcome: Progressing Goal: Cardiovascular complication will be avoided Outcome: Progressing   Problem: Nutrition: Goal: Adequate nutrition will be maintained Outcome: Progressing   Problem: Elimination: Goal: Will not experience complications related to bowel motility Outcome: Progressing Goal: Will not experience complications related to urinary retention Outcome: Progressing   Problem: Safety: Goal: Ability to remain free from injury will improve Outcome: Progressing   Problem: Skin Integrity: Goal: Risk for impaired skin integrity will decrease Outcome: Progressing   Problem: Safety: Goal: Ability to remain free from injury will improve Outcome: Progressing   Problem: Health Behavior/Discharge Planning: Goal: Ability to manage health-related needs will improve Outcome: Not Progressing   Patient with altered mental status and currently unable to provide self care/ADLs independently.  Problem: Clinical Measurements: Goal: Ability to maintain clinical measurements within normal limits will improve Outcome: Not Progressing Goal: Diagnostic test results will improve Outcome: Not Progressing Goal: Respiratory complications will improve Outcome: Not Progressing  Patient with respiratory rate 30+. Chest xray without improvement. Patient noted with increase throat secretions and garbled speech at times.   Problem: Activity: Goal: Risk for activity intolerance will decrease Outcome: Not Progressing Patient with multiple rib fractures that illicit pain  upon movement and noted with tachypnea (resp rate 30+). Patient not tolerating increase activity at this time.  Problem: Coping: Goal: Level of anxiety will decrease Outcome: Not Progressing   Problem: Pain Managment: Goal: General experience of comfort will improve Outcome: Not Progressing   Patient with multiple rib fractures and noted with  increase restlessness and tachypnea. Patients general experience of comfort not improving at this time despite pain medications being administered PRN.  Problem: Education: Goal: Knowledge of disease or condition will improve Outcome: Not Progressing Goal: Understanding of discharge needs will improve Outcome: Not Progressing  Patient with altered mental status and unable to understand discharge needs at this time. Patient unable to understand disease/condition that contributes to this hospitalization.    Problem: Health Behavior/Discharge Planning: Goal: Ability to identify changes in lifestyle to reduce recurrence of condition will improve Outcome: Not Progressing  Patient with altered mental status and unable to identify changes in lifestyle.  Goal: Identification of resources available to assist in meeting health care needs will improve Outcome: Not Progressing  Patient with altered mental status and unable to identify resources.    Problem: Activity: Goal: Ability to tolerate increased activity will improve Outcome: Not Progressing  Patient unable to tolerate increase activity at this time. Bed mobility/activity causes increase pain and increases patients tachypnea.   Problem: Respiratory: Goal: Ability to maintain a clear airway and adequate ventilation will improve Outcome: Not Progressing  Patient with respiratory rate 30+. Chest xray without improvement. Patient noted with increase throat secretions and garbled speech at times.

## 2018-05-28 NOTE — Evaluation (Signed)
Clinical/Bedside Swallow Evaluation Patient Details  Name: [name] Pace MRN: [id number] Date of Birth: [birth year]  Today's Date: 05/28/2018 Time: SLP Start Time (ACUTE ONLY): 73 SLP Stop Time (ACUTE ONLY): 1028 SLP Time Calculation (min) (ACUTE ONLY): 8 min  Past Medical History:  Past Medical History:  Diagnosis Date  . Alcohol abuse   . Anxiety   . Breast cancer ([place]) Left breast   Per patient diagnosed in early 2's.   . Coronary artery disease   . High cholesterol   . Prostate cancer ([place])    per patient diagnosed in early 35's  . Stroke [facility])    Past Surgical History:  Past Surgical History:  Procedure Laterality Date  . CAROTID STENT    . CORONARY ARTERY BYPASS GRAFT    . FEMUR IM NAIL Left 07/02/2014   Procedure: INTRAMEDULLARY (IM) NAIL FEMORAL;  Surgeon: Elie Goody, MD;  Location: [facility];  Service: Orthopedics;  Laterality: Left;  . I&D EXTREMITY Right 02/10/2016   Procedure: IRRIGATION AND DEBRIDEMENT EXTREMITY;  Surgeon: Dorna Leitz, MD;  Location: [place];  Service: Orthopedics;  Laterality: Right;  . TENDON REPAIR Right 02/10/2016   Procedure: Anterior TENDON REPAIR and wound exploration.;  Surgeon: Dorna Leitz, MD;  Location: [place];  Service: Orthopedics;  Laterality: Right;   HPI:  Patient is a 67 year old male with history of alcohol abuse, CAD, history of CVA presented to ED with severe right-sided chest wall pain after a fall on the night of 12/7. Pt with R rib fxs 5-11. Intubated 12/13, self extubated 12/18.    Assessment / Plan / Recommendation Clinical Impression  Pt has signs of a post-extubation dysphagia that includes mild hoarseness and a wet vocal quality. Pt does not cough spontaneously or to command to clear audible secretions, suspected to be pooling in his pharynx and/or larynx. Attempted oral motor exam and PO trials, but pt cognitively is not ready as he fluctuates between not attending to task and refusing trials offered. Will continue to  follow for readiness for POs pending further improvements in mentation and secretion management. SLP Visit Diagnosis: Dysphagia, unspecified (R13.10)    Aspiration Risk  Moderate aspiration risk;Severe aspiration risk    Diet Recommendation NPO;Alternative means - temporary   Medication Administration: Via alternative means    Other  Recommendations Oral Care Recommendations: Oral care QID Other Recommendations: Have oral suction available   Follow up Recommendations (tba)      Frequency and Duration min 2x/week  2 weeks       Prognosis Prognosis for Safe Diet Advancement: Good Barriers to Reach Goals: Cognitive deficits      Swallow Study   General HPI: Patient is a 67 year old male with history of alcohol abuse, CAD, history of CVA presented to ED with severe right-sided chest wall pain after a fall on the night of 12/7. Pt with R rib fxs 5-11. Intubated 12/13, self extubated 12/18.  Type of Study: Bedside Swallow Evaluation Previous Swallow Assessment: none in chart Diet Prior to this Study: NPO;NG Tube Temperature Spikes Noted: No Respiratory Status: Nasal cannula History of Recent Intubation: Yes Length of Intubations (days): 6 days Date extubated: 05/23/18(self-extubated) Behavior/Cognition: Alert;Agitated;Uncooperative Oral Cavity Assessment: (UTA) Oral Care Completed by SLP: No(pt would not allow) Oral Cavity - Dentition: (UTA) Self-Feeding Abilities: Refused PO Patient Positioning: Upright in bed Volitional Cough: Cognitively unable to elicit    Oral/Motor/Sensory Function Overall Oral Motor/Sensory Function: (appears functional)   Ice Chips Ice chips: (pt refused)   Thin Liquid  Thin Liquid: (pt refused)    Nectar Thick Nectar Thick Liquid: Not tested   Honey Thick Honey Thick Liquid: Not tested   Puree Puree: Not tested   Solid     Solid: Not tested      [name] Pace 05/28/2018,11:01 AM  [name] Pace, M.A. [place] Acute Art therapist [phone] Office [phone]

## 2018-05-28 NOTE — Progress Notes (Signed)
This note also relates to the following rows which could not be included: Pulse Rate - Cannot attach notes to unvalidated device data Resp - Cannot attach notes to unvalidated device data BP - Cannot attach notes to unvalidated device data SpO2 - Cannot attach notes to unvalidated device data  Pt in no distress at this time and bipap is not indicated.RT will monitor.

## 2018-05-29 ENCOUNTER — Inpatient Hospital Stay (HOSPITAL_COMMUNITY): Payer: Medicare Other

## 2018-05-29 LAB — GLUCOSE, CAPILLARY
Glucose-Capillary: 108 mg/dL — ABNORMAL HIGH (ref 70–99)
Glucose-Capillary: 115 mg/dL — ABNORMAL HIGH (ref 70–99)
Glucose-Capillary: 125 mg/dL — ABNORMAL HIGH (ref 70–99)
Glucose-Capillary: 129 mg/dL — ABNORMAL HIGH (ref 70–99)
Glucose-Capillary: 88 mg/dL (ref 70–99)
Glucose-Capillary: 92 mg/dL (ref 70–99)
Glucose-Capillary: 97 mg/dL (ref 70–99)

## 2018-05-29 LAB — BASIC METABOLIC PANEL
Anion gap: 7 (ref 5–15)
BUN: 10 mg/dL (ref 8–23)
CALCIUM: 6.6 mg/dL — AB (ref 8.9–10.3)
CO2: 16 mmol/L — ABNORMAL LOW (ref 22–32)
Chloride: 115 mmol/L — ABNORMAL HIGH (ref 98–111)
Creatinine, Ser: 0.39 mg/dL — ABNORMAL LOW (ref 0.61–1.24)
GFR calc Af Amer: >60 mL/min (ref >60)
GFR calc non Af Amer: >60 mL/min (ref >60)
Glucose, Bld: 91 mg/dL (ref 70–99)
Potassium: 3.1 mmol/L — ABNORMAL LOW (ref 3.5–5.1)
SODIUM: 138 mmol/L (ref 135–145)

## 2018-05-29 LAB — CBC WITH DIFFERENTIAL/PLATELET
Abs Immature Granulocytes: 0.05 10*3/uL (ref 0.00–0.07)
Basophils Absolute: 0.1 10*3/uL (ref 0.0–0.1)
Basophils Relative: 1 %
EOS ABS: 0.4 10*3/uL (ref 0.0–0.5)
Eosinophils Relative: 4 %
HCT: 36.9 % — ABNORMAL LOW (ref 39.0–52.0)
Hemoglobin: 12.1 g/dL — ABNORMAL LOW (ref 13.0–17.0)
IMMATURE GRANULOCYTES: 1 %
Lymphocytes Relative: 17 %
Lymphs Abs: 1.6 10*3/uL (ref 0.7–4.0)
MCH: 34.4 pg — ABNORMAL HIGH (ref 26.0–34.0)
MCHC: 32.8 g/dL (ref 30.0–36.0)
MCV: 104.8 fL — ABNORMAL HIGH (ref 80.0–100.0)
Monocytes Absolute: 0.7 10*3/uL (ref 0.1–1.0)
Monocytes Relative: 8 %
NEUTROS PCT: 69 %
Neutro Abs: 6.5 10*3/uL (ref 1.7–7.7)
PLATELETS: 621 10*3/uL — AB (ref 150–400)
RBC: 3.52 MIL/uL — ABNORMAL LOW (ref 4.22–5.81)
RDW: 12.6 % (ref 11.5–15.5)
WBC: 9.3 10*3/uL (ref 4.0–10.5)
nRBC: 0 % (ref 0.0–0.2)

## 2018-05-29 LAB — BODY FLUID CELL COUNT WITH DIFFERENTIAL
Eos, Fluid: 2 %
Lymphs, Fluid: 64 %
MONOCYTE-MACROPHAGE-SEROUS FLUID: 33 % — AB (ref 50–90)
Neutrophil Count, Fluid: 1 % (ref 0–25)
Total Nucleated Cell Count, Fluid: 395 cu mm (ref 0–1000)

## 2018-05-29 LAB — MAGNESIUM: Magnesium: 1.5 mg/dL — ABNORMAL LOW (ref 1.7–2.4)

## 2018-05-29 LAB — GRAM STAIN

## 2018-05-29 LAB — LACTATE DEHYDROGENASE, PLEURAL OR PERITONEAL FLUID: LD, Fluid: 253 U/L — ABNORMAL HIGH (ref 3–23)

## 2018-05-29 LAB — PROTEIN, PLEURAL OR PERITONEAL FLUID: Total protein, fluid: 3.4 g/dL

## 2018-05-29 LAB — PHOSPHORUS: Phosphorus: 3.1 mg/dL (ref 2.5–4.6)

## 2018-05-29 MED ORDER — FREE WATER
100.0000 mL | Freq: Three times a day (TID) | Status: DC
  Administered 2018-05-29 – 2018-06-02 (×13): 100 mL

## 2018-05-29 MED ORDER — IBUPROFEN 100 MG/5ML PO SUSP
400.0000 mg | Freq: Four times a day (QID) | ORAL | Status: DC
  Administered 2018-05-29 – 2018-06-01 (×12): 400 mg via ORAL
  Filled 2018-05-29 (×16): qty 20

## 2018-05-29 MED ORDER — MAGNESIUM SULFATE 2 GM/50ML IV SOLN
2.0000 g | Freq: Once | INTRAVENOUS | Status: AC
  Administered 2018-05-29: 2 g via INTRAVENOUS
  Filled 2018-05-29: qty 50

## 2018-05-29 MED ORDER — ENOXAPARIN SODIUM 40 MG/0.4ML ~~LOC~~ SOLN
40.0000 mg | SUBCUTANEOUS | Status: DC
  Administered 2018-05-30 – 2018-06-09 (×10): 40 mg via SUBCUTANEOUS
  Filled 2018-05-29 (×12): qty 0.4

## 2018-05-29 MED ORDER — HALOPERIDOL LACTATE 5 MG/ML IJ SOLN
2.0000 mg | Freq: Four times a day (QID) | INTRAMUSCULAR | Status: DC | PRN
  Administered 2018-05-30 – 2018-06-04 (×4): 2 mg via INTRAVENOUS
  Filled 2018-05-29 (×5): qty 1

## 2018-05-29 MED ORDER — LIDOCAINE HCL (PF) 1 % IJ SOLN
INTRAMUSCULAR | Status: AC
  Filled 2018-05-29: qty 30

## 2018-05-29 MED ORDER — QUETIAPINE FUMARATE 50 MG PO TABS
50.0000 mg | ORAL_TABLET | Freq: Once | ORAL | Status: AC
  Administered 2018-05-29: 50 mg via ORAL
  Filled 2018-05-29: qty 1

## 2018-05-29 MED ORDER — POTASSIUM CHLORIDE 20 MEQ/15ML (10%) PO SOLN
40.0000 meq | Freq: Once | ORAL | Status: AC
  Administered 2018-05-29: 40 meq via ORAL
  Filled 2018-05-29: qty 30

## 2018-05-29 MED ORDER — ASPIRIN EC 81 MG PO TBEC
81.0000 mg | DELAYED_RELEASE_TABLET | Freq: Every day | ORAL | Status: DC
  Filled 2018-05-29: qty 1

## 2018-05-29 MED ORDER — QUETIAPINE FUMARATE 100 MG PO TABS
100.0000 mg | ORAL_TABLET | Freq: Two times a day (BID) | ORAL | Status: DC
  Administered 2018-05-29 – 2018-05-31 (×4): 100 mg
  Filled 2018-05-29 (×4): qty 1

## 2018-05-29 NOTE — Procedures (Signed)
   US guided PORTABLE right thoracentesis  180 cc blood tinged fluid  tolerated well cxr pending

## 2018-05-29 NOTE — Progress Notes (Signed)
NAME:  [name] Pace, MRN:  [id number], DOB:  10-May-1951, LOS: 5 ADMISSION DATE:  05/13/2018, CONSULTATION DATE:  05/20/18 REFERRING MD:  Dr. Earnest Conroy, CHIEF COMPLAINT:  Tachypnea    Brief History   67 y/o M, smoker, admitted 12/8 after a fall with right sided chest pain.  Work up revealed multiple right sided rib fractures and small pneumothorax which did not require a chest tube.  PCCM called to see the patient  12/13 for tachypnea and increasing O2 needs.   Past Medical History  Breast cancer  CVA Prostate cancer  Anxiety  ETOH abuse  HLD   Significant Hospital Events   12/08 Admit after fall with rib fractures, small PTX 12/09 CIWA protocol for ETOH withdrawal 12/13 Rapid response PCCM consulted: intubated 12/14 Precedex changed to Fentanyl for pain control 12/15-  Levophed at 8 mcg, fentanyl at 436mcg's. 12/16-  precedex started this morning due to agitation despite dilaudid gtt => helping / + 14L since admit. Off levophed. On TF. Getting 200cc/h saline stopped/ diuresis started 12/17 -  remains on precedex 0.3 and dilaudid gtt at 2-4 mg/hr.  RN states patient wakes up panicked and gets combative.   12/18 - s/p Rt thora 465mL clear fluid. Exudate by LDH with 55% lympos and 25% polys and 8% eos (cytology pending)  -> self extubated - > very weak voice and diffusely weak, 5L [place] and pulse ox 86%, very weak cough. Oriented (off dilaudid gtt) and wants to go home 12/19 - remains extubated. Never needed percedex. But needs sitter. Asked for heroin v dilaudid opioids once (brother died of opioid use). Very deconditioned - high potential SNF patient but family concerned he will sign out AMA. Pulse ox 92% on 5LNC currently.  Noted: -  RN report: patient verbally abusive using terms such as "foreigner" to the sitter. CAM-ICU mildly positive for delirium but improved since yesterday. Sitter being replaced as patient gets more agitated in presence of sitter. - Able to take po per RN - also ongoing  pain from rib fractures  - moprhine 1mg  prn not helping 12/21: currently on precedex. 12/22: precedex discontinued last night.  Consults:  PCCM 12/13   Procedures:  R IJ ? Date > out 05/25/18 PICC placed 05/25/18    Significant Diagnostic Tests:  CT Chest 12/8 >> multiple mildly displaced right rib fractures 5-11th ribs, minimal right apical pneumothorax, rounded structure in the anterior mediastinum, likely a saccular aneurysm CT Chest 12/14 >> no PTX, moderate bilateral pleural effusions with adjacent atelectasis, multiple moderately displaced rib fractures with overlying sq emphysema of right chest wall, 2.4 cm rounded abnormality to the left of the ascending thoracid aorta which is concerning for possible aneurysm  Echo 12/16 > The cavity size was normal. Wall thickness was normal. Systolic function was normal. The estimated ejection fraction was in the range of 50% to 55%. RV and RA with moderate dilation.   Micro Data:  UA 12/12 >> many bacteria, nitrite +, small leukocytes, 21-50 WBC Trach aspirate 12/13 - strep pneumo Urine strep 12/19 - neg Urine leg 12/19 - neg  Antimicrobials:  Levaquin 12/13 >> 12/19 Flagyl 12/13    Interim history/subjective:  Ongoing delirium. Off precedex. K low. Now on room air.   Objective   Blood pressure 113/84, pulse 93, temperature 98.5 F (36.9 C), temperature source Oral, resp. rate (!) 30, height 6\' 2"  (1.88 m), weight 67.7 kg, SpO2 98 %.        Intake/Output Summary (Last 24  hours) at 05/29/2018 0901 Last data filed at 05/29/2018 0700 Gross per 24 hour  Intake 1746.02 ml  Output 1000 ml  Net 746.02 ml   Filed Weights   05/26/18 0500 05/27/18 0628 05/29/18 0500  Weight: 66 kg 67.2 kg 67.7 kg    General:  Frail elderly male in NAD Neuro:  Awake, alert. Follows commands and answers some questions appropriately, still with some significant delirium. Agitation much better.   HEENT:  [place]/AT, No JVD noted, PERRL Cardiovascular:  RRR,  no MRG Lungs:  Diminished Right base.  Abdomen:  Soft, non-distended, non-tender Musculoskeletal: No acute deformity Skin:  Intact, Mucous membranes dry.   LABS    PULMONARY Recent Labs  Lab 05/23/18 1159 05/27/18 2047  PHART 7.449 7.508*  PCO2ART 43.5 29.3*  PO2ART 61.0* 69.0*  HCO3 30.2* 23.3  TCO2 31 24  O2SAT 92.0 96.0    CBC Recent Labs  Lab 05/27/18 0434 05/28/18 0518 05/29/18 0548  HGB 11.7* 11.8* 12.1*  HCT 35.6* 35.5* 36.9*  WBC 9.3 9.3 9.3  PLT 596* 624* 621*    COAGULATION No results for input(s): INR in the last 168 hours.  CARDIAC   Recent Labs  Lab 05/25/18 1036 05/25/18 1540 05/25/18 2041 05/26/18 0512  TROPONINI <0.03 <0.03 <0.03 <0.03   No results for input(s): PROBNP in the last 168 hours.   CHEMISTRY Recent Labs  Lab 05/25/18 0446 05/25/18 1036 05/25/18 2350 05/26/18 0512  05/27/18 0434 05/28/18 0518 05/29/18 0451  NA  --  141 141  --   --  144 144 138  K  --  3.3* 2.7*  --    < > 3.3* 3.4* 3.1*  CL  --  95* 96*  --   --  108 111 115*  CO2  --  29 33*  --   --  26 22 16*  GLUCOSE  --  99 166*  --   --  116* 122* 91  BUN  --  14 16  --   --  18 14 10   CREATININE  --  0.90 0.60*  --   --  0.63 0.52* 0.39*  CALCIUM  --  8.4* 8.3*  --   --  8.5* 8.5* 6.6*  MG 1.8  --   --  2.4  --  2.2 2.0 1.5*  PHOS 3.7  --   --  2.9  --  2.9 3.3 3.1   < > = values in this interval not displayed.   Estimated Creatinine Clearance: 88.2 mL/min (A) (by C-G formula based on SCr of 0.39 mg/dL (L)).   LIVER Recent Labs  Lab 05/23/18 0340 05/28/18 1357  PROT  --  6.1*  ALBUMIN 1.6*  --      INFECTIOUS No results for input(s): LATICACIDVEN, PROCALCITON in the last 168 hours.   ENDOCRINE CBG (last 3)  Recent Labs    05/29/18 0004 05/29/18 0444 05/29/18 0803  GLUCAP 108* 125* 129*    IMAGING x48h  - image(s) personally visualized  -   highlighted in bold Dg Abd 1 View  Result Date: 05/27/2018 CLINICAL DATA:  Nasogastric tube  repositioning. EXAM: ABDOMEN - 1 VIEW COMPARISON:  Abdominal radiograph performed earlier today at 6:39 p.m. FINDINGS: The patient's enteric tube is noted coiled at the antrum and body of the stomach. The visualized bowel gas pattern is grossly unremarkable. No acute osseous abnormalities are seen. The patient is status post median sternotomy. A small right pleural effusion is again noted, with overlying  displaced rib fractures. IMPRESSION: Enteric tube noted coiled at the antrum and body of the stomach. Electronically Signed   By: Garald Balding M.D.   On: 05/27/2018 22:18   Dg Abd 1 View  Result Date: 05/27/2018 CLINICAL DATA:  NG tube placement EXAM: ABDOMEN - 1 VIEW COMPARISON:  None. FINDINGS: Esophageal tube is folded back upon itself over the stomach. There may be a kink in the distal tube. Elevated right diaphragm with pleural and parenchymal disease. Nonobstructed bowel-gas pattern. IMPRESSION: Esophageal tube is folded back upon itself distally with possible kink in the distal tube. The tip is positioned over the expected location of gastric body Electronically Signed   By: Donavan Foil M.D.   On: 05/27/2018 19:52   Dg Chest Port 1 View  Result Date: 05/28/2018 CLINICAL DATA:  Respiratory failure EXAM: PORTABLE CHEST 1 VIEW COMPARISON:  May 27, 2018 FINDINGS: Feeding tube tip is in the stomach. Central catheter tip is in the superior vena cava. No pneumothorax a. There is a moderate pleural effusion on the right with consolidation throughout the right mid and lower lung zones. There is mild upper lobe atelectatic change bilaterally. No consolidation on the left. There is cardiomegaly with pulmonary venous hypertension. Patient is status post coronary artery bypass grafting. A stent is noted in the left subclavian region medially, stable. IMPRESSION: Tube and catheter positions as described without pneumothorax. Right pleural effusion with consolidation in the right mid and lower lung zones.  Some of this consolidation is due to atelectasis. Pneumonia on the right is suspected, however. There is mild upper lobe atelectasis bilaterally. There is pulmonary vascular congestion. Postoperative changes are noted. Electronically Signed   By: Lowella Grip III M.D.   On: 05/28/2018 07:10   Dg Chest Port 1 View  Result Date: 05/27/2018 CLINICAL DATA:  Increased shortness of breath, history breast cancer, coronary artery disease, prostate cancer EXAM: PORTABLE CHEST 1 VIEW COMPARISON:  Portable exam 1316 hours compared to 05/26/2018 FINDINGS: RIGHT arm PICC line tip projects over SVC. Feeding tube extends into stomach. Borderline enlargement of cardiac silhouette post CABG. Mediastinal contours and pulmonary vascularity normal. Persistent RIGHT pleural effusion and basilar atelectasis versus consolidation. LEFT lung clear. No pneumothorax. Bones demineralized. IMPRESSION: Persistent RIGHT pleural effusion and basilar atelectasis versus consolidation. Borderline enlargement of cardiac silhouette post CABG. Electronically Signed   By: Lavonia Dana M.D.   On: 05/27/2018 13:41   Resolved Hospital Problem list    pneumohtorax Septic shock  Assessment & Plan:   Mechanical Fall with Multiple Rib Fractures Severe Pain with agitated delirium from pain and etoh withdrawal and hx of CVA. - Continue fentanyl prn - Haldol scheduled > will change to PRN today with increased dose on seroquel.  - Increase Seroquel  - Continue to hold off Gabapentin, oxy IR, and klonopin - Lidoderm patch injured ribs - Sitter - Continue daily ASA/ thiamine/ folate. Drop ASA to 81mg . No indication found for full dose.  - Scheduled ibuprofen 400mg  q 6 hours for pain. PRN fentanyl for severe pain.   Acute Hypoxic Respiratory Failure - in setting multiple rib fractures, atelectasis, pnuemoccocal pneumonia, and pneumothorax. Initially required intubation, self extubated 12/18. Never required chest tube.  Course was then  complicated by volume overload, but currently he is about even for the admission. Much improved. - supplemental O2 as needed to keep SpO2 > 92%. Currently room air.  - Completed course of Levaquin - Follow WBC and fever curve.   Layering effusion vs hemothorax  given multiple rib fx -  12/18  - s/p thora 12/18 - 450cc removed . Exudate. Cytology show reactive atypical mesothelial cells. CXR with more dense opacification of R base today.  - Follow CXR intermittently.  - Will ask IR to repeat thoracentesis. Follow pleural fluid studies.  Electrolyte imbalance - low mag - 2G given by Warren Lacy - Give K  Physical deconditioning -PT/OT - not candidate for [facility], will need SNF  Transfer to telemetry, [place] for 12/25.  Best practice:  Diet: TF, Free water Pain/Anxiety/Delirium protocol (if indicated): see CNS Section VAP protocol (if indicated): NA DVT prophylaxis: Lovenox  GI prophylaxis: none, enteral feeds.  Glucose control: n/a  Mobility: bedrest Code Status: Full code Family Communication: Daughter updated at bedside 12/22 No family present today. Disposition: Transfer to tele. Social: Substance abuser. Not in touch with daughter. High risk for declining SNF services  Georgann Housekeeper, [place] [facility] Pager [phone] or [phone]  05/29/2018 9:01 AM

## 2018-05-29 NOTE — Progress Notes (Signed)
Patient's daughter Fountain Derusha was notified of the patient transferring to room 4E07.

## 2018-05-29 NOTE — Progress Notes (Signed)
Telephone SBAR report given to the nurse assigned room 4E07. Pt was transferred to room 4E07 via bed on telementry with RN and Air cabin crew. Pt is alert, on room air, and vital signs are stable(see flowsheet).

## 2018-05-29 NOTE — Progress Notes (Signed)
Called [facility] and notified them of K=3.1 and Mag 1.5.   New orders placed... See [facility]

## 2018-05-29 NOTE — Progress Notes (Signed)
[facility] Physician-Brief Progress Note Patient Name: [name] Pace DOB: 1950/11/01 MRN: [id number]   Date of Service  05/29/2018  HPI/Events of Note    eICU Interventions  Hypomagnesemia -repleted      Intervention Category Intermediate Interventions: Electrolyte abnormality - evaluation and management  [name] V. [name] 05/29/2018, 6:47 AM

## 2018-05-29 NOTE — Progress Notes (Signed)
Pt transferred to 4E-07 via bed with SWOT RN and safety sitting with restraints in place. Pt moved to room bed, pt tolerated well. Restraints not in use at this time. Pt given CHG bath. Tele applied, CCMD notified. Will continue to monitor.  Amanda Cockayne, RN

## 2018-05-29 NOTE — Plan of Care (Signed)
  Problem: Clinical Measurements: Goal: Will remain free from infection Outcome: Progressing Goal: Cardiovascular complication will be avoided Outcome: Progressing   Problem: Nutrition: Goal: Adequate nutrition will be maintained Outcome: Progressing   Problem: Elimination: Goal: Will not experience complications related to bowel motility Outcome: Progressing Goal: Will not experience complications related to urinary retention Outcome: Progressing   Problem: Safety: Goal: Ability to remain free from injury will improve Outcome: Progressing   Problem: Skin Integrity: Goal: Risk for impaired skin integrity will decrease Outcome: Progressing   Problem: Safety: Goal: Ability to remain free from injury will improve Outcome: Progressing

## 2018-05-30 ENCOUNTER — Inpatient Hospital Stay (HOSPITAL_COMMUNITY): Payer: Medicare Other

## 2018-05-30 LAB — CBC WITH DIFFERENTIAL/PLATELET
Abs Immature Granulocytes: 0.04 10*3/uL (ref 0.00–0.07)
Basophils Absolute: 0.1 10*3/uL (ref 0.0–0.1)
Basophils Relative: 1 %
Eosinophils Absolute: 0.6 10*3/uL — ABNORMAL HIGH (ref 0.0–0.5)
Eosinophils Relative: 6 %
HCT: 34.4 % — ABNORMAL LOW (ref 39.0–52.0)
Hemoglobin: 11.4 g/dL — ABNORMAL LOW (ref 13.0–17.0)
Immature Granulocytes: 0 %
Lymphocytes Relative: 17 %
Lymphs Abs: 1.6 10*3/uL (ref 0.7–4.0)
MCH: 33.9 pg (ref 26.0–34.0)
MCHC: 33.1 g/dL (ref 30.0–36.0)
MCV: 102.4 fL — ABNORMAL HIGH (ref 80.0–100.0)
Monocytes Absolute: 0.8 10*3/uL (ref 0.1–1.0)
Monocytes Relative: 9 %
NEUTROS ABS: 6.3 10*3/uL (ref 1.7–7.7)
Neutrophils Relative %: 67 %
Platelets: 608 10*3/uL — ABNORMAL HIGH (ref 150–400)
RBC: 3.36 MIL/uL — ABNORMAL LOW (ref 4.22–5.81)
RDW: 12.4 % (ref 11.5–15.5)
WBC: 9.4 10*3/uL (ref 4.0–10.5)
nRBC: 0 % (ref 0.0–0.2)

## 2018-05-30 LAB — BASIC METABOLIC PANEL
Anion gap: 7 (ref 5–15)
BUN: 18 mg/dL (ref 8–23)
CO2: 20 mmol/L — ABNORMAL LOW (ref 22–32)
Calcium: 8.2 mg/dL — ABNORMAL LOW (ref 8.9–10.3)
Chloride: 109 mmol/L (ref 98–111)
Creatinine, Ser: 0.56 mg/dL — ABNORMAL LOW (ref 0.61–1.24)
GFR calc Af Amer: >60 mL/min (ref >60)
GFR calc non Af Amer: >60 mL/min (ref >60)
Glucose, Bld: 118 mg/dL — ABNORMAL HIGH (ref 70–99)
Potassium: 4.6 mmol/L (ref 3.5–5.1)
Sodium: 136 mmol/L (ref 135–145)

## 2018-05-30 LAB — GLUCOSE, CAPILLARY
GLUCOSE-CAPILLARY: 91 mg/dL (ref 70–99)
Glucose-Capillary: 104 mg/dL — ABNORMAL HIGH (ref 70–99)
Glucose-Capillary: 97 mg/dL (ref 70–99)
Glucose-Capillary: 108 mg/dL — ABNORMAL HIGH (ref 70–99)

## 2018-05-30 LAB — MAGNESIUM: Magnesium: 2.4 mg/dL (ref 1.7–2.4)

## 2018-05-30 MED ORDER — ASPIRIN 81 MG PO CHEW
81.0000 mg | CHEWABLE_TABLET | Freq: Every day | ORAL | Status: DC
  Administered 2018-05-30 – 2018-06-02 (×4): 81 mg
  Filled 2018-05-30 (×4): qty 1

## 2018-05-30 MED ORDER — DEXTROSE-NACL 5-0.9 % IV SOLN
INTRAVENOUS | Status: DC
  Administered 2018-05-30: 10:00:00 via INTRAVENOUS

## 2018-05-30 MED ORDER — CHLORHEXIDINE GLUCONATE 0.12 % MT SOLN
OROMUCOSAL | Status: AC
  Administered 2018-05-30: 15 mL via OROMUCOSAL
  Filled 2018-05-30: qty 15

## 2018-05-30 MED ORDER — ASPIRIN 81 MG PO CHEW: 81.0000 mg | CHEWABLE_TABLET | Freq: Every day | ORAL | Status: DC

## 2018-05-30 NOTE — Progress Notes (Signed)
PROGRESS NOTE    [name] Pace  HDQ:[id number] DOB: Aug 23, 1950 DOA: 05/13/2018 PCP: System, Provider Not In   Brief Narrative: Patient is a 67 year old smoker with past medical history of CVA, prostate cancer, anxiety, alcohol abuse, substance abuse, hyperlipidemia admitted on 12/8 after a fall with right-sided chest pain.  Work-up revealed multiple right-sided rib fractures and small pneumothorax which did not require a chest tube.  He was initially admitted under hospitalist service but required pulmonary evaluation and admiitted under their servicer due  to tachypnea and acute respiratory failure, alcohol withdrawal.  Patient was intubated on 12/13.  He required Precedex drip, Levophed when he was on pulmonary service.  Also underwent right-sided thoracentesis with removal of 450 of clear fluid on 12/18.  He self extubated on 12/18.  Continue to be agitated, needed sitter.  Finally off Precedex drips.  Also had to be started on tube feeding due to inability to take by mouth. TRH resumed  on 05/30/2018  Assessment & Plan:   Principal Problem:   Multiple rib fractures Active Problems:   Hyponatremia   Alcohol abuse   CAD (coronary artery disease)   History of stroke   Pneumothorax, closed, traumatic, initial encounter   Delirium tremens ([place])   Acute respiratory failure (HCC)   Shock circulatory (HCC)   Malnutrition of moderate degree   Encephalopathy acute  Agitation/ongoing delirium: Thought to be secondary to pain, alcohol withdrawal.  Also has history of CVA.  Might have underlying vascular dementia.  Continue PRN Haldol.  On Seroquel.  Continue pain management.  Sitter at the bedside. Encouraged bright room, family involvement  Chronic alcoholism/substance abuse: Alcohol withdrawal suspected .continue on daily thiamine, folic acid.  Continue to monitor CIWA. Confirmed with daughter that he had been using drugs including IV for last several years.  Dysphagia/inability to swallow:  Secondary to ongoing agitation/disorientation delirium .started on tube feeding.  Will request for speech therapy evaluation tomorrow if he can eat by mouth  Mechanical fall/rib fractures: Multiple rib fractures noticed on admission.  Continue pain management.  Acute hypoxic respiratory failure: In the setting of multiple rib fractures, atelectasis, pneumonia, pneumothorax.  Initially required intubation.  Self extubated  on 12/18.  Never required chest tube.  Hospital course was also complicated by volume overload.  Continue supplemental oxygen as needed.  Currently respiratory status looks stable.  Completed course of Levaquin.  Currently afebrile.  Layering effusion/hemothorax: On right side.  History of multiple rib fracture.  Status post 4 on 12/18 with removal of 40 cc of fluid.  Exudate.  Cytology showed reactive atypical mesothelial cells.  Again underwent right-sided thoracentesis on 05/27/2018 with removal of 180 cc of bloody tinged fluid.  Hypokalemia/hypomagnesemia: Supplemented.  Deconditioning/debility: Ordered PT/OT.  Not a candidate for LTAC.  Plan for skilled nursing facility.   Nutrition Problem: Moderate Malnutrition Etiology: social / environmental circumstances(EtOH and tobacco abuse)      DVT prophylaxis: Lovenox Code Status: Full Family Communication: Discussed extensively with the daughter Disposition Plan: Skilled nursing facility after resolution of agitation, starting of oral feeding   Consultants: PCCM  Procedures: Intubation  Antimicrobials: None  Subjective: Patient seen and examined at bedside this morning.  Remains hemodynamically stable.  Very agitated .  Alert but not oriented.  Objective: Vitals:   05/30/18 0800 05/30/18 0900 05/30/18 1100 05/30/18 1130  BP: 119/88   101/74  Pulse: 82 83 80 78  Resp: (!) 24 (!) 30 (!) 30 (!) 24  Temp: 97.7 F (36.5 C)  98.2 F (36.8 C)  TempSrc: Oral   Oral  SpO2: 100% 96% 97% 97%  Weight:      Height:         Intake/Output Summary (Last 24 hours) at 05/30/2018 1314 Last data filed at 05/30/2018 0730 Gross per 24 hour  Intake 410 ml  Output 625 ml  Net -215 ml   Filed Weights   05/27/18 0628 05/29/18 0500 05/30/18 0353  Weight: 67.2 kg 67.7 kg 66.4 kg    Examination:  General exam: Agitated/debilitated HEENT:PERRL,Oral mucosa moist, Ear/Nose normal on gross exam Respiratory system: Bilateral decreased air entry in the bases Cardiovascular system: S1 & S2 heard, RRR. No JVD, murmurs, rubs, gallops or clicks. No pedal edema. Gastrointestinal system: Abdomen is nondistended, soft and nontender. No organomegaly or masses felt. Normal bowel sounds heard. Central nervous system: Alert but not oriented. No focal neurological deficits. Extremities: No edema, no clubbing ,no cyanosis, distal peripheral pulses palpable. Skin: No rashes, lesions or ulcers,no icterus ,no pallor Psychiatry: Judgement and insight appear impaired   Data Reviewed: I have personally reviewed following labs and imaging studies  CBC: Recent Labs  Lab 05/25/18 0446 05/26/18 0512 05/27/18 0434 05/28/18 0518 05/29/18 0548  WBC 8.0 7.2 9.3 9.3 9.3  NEUTROABS 5.6 5.1 6.4 6.6 6.5  HGB 10.9* 11.1* 11.7* 11.8* 12.1*  HCT 33.2* 33.7* 35.6* 35.5* 36.9*  MCV 104.7* 105.0* 105.3* 106.3* 104.8*  PLT 455* 541* 596* 624* 767*   Basic Metabolic Panel: Recent Labs  Lab 05/25/18 0446  05/25/18 1036 05/25/18 2350 05/26/18 0512 05/26/18 0753 05/27/18 0434 05/28/18 0518 05/29/18 0451 05/30/18 0326  NA  --   --  141 141  --   --  144 144 138  --   K  --    < > 3.3* 2.7*  --  3.6 3.3* 3.4* 3.1*  --   CL  --   --  95* 96*  --   --  108 111 115*  --   CO2  --   --  29 33*  --   --  26 22 16*  --   GLUCOSE  --   --  99 166*  --   --  116* 122* 91  --   BUN  --   --  14 16  --   --  _0 --   CREATININE  --   --  0.90 0.60*  --   --  0.63 0.52* 0.39*  --   CALCIUM  --   --  8.4* 8.3*  --   --  8.5* 8.5* 6.6*   --   MG 1.8  --   --   --  2.4  --  2.2 2.0 1.5* 2.4  PHOS 3.7  --   --   --  2.9  --  2.9 3.3 3.1  --    < > = values in this interval not displayed.   GFR: Estimated Creatinine Clearance: 86.5 mL/min (A) (by C-G formula based on SCr of 0.39 mg/dL (L)). Liver Function Tests: Recent Labs  Lab 05/28/18 1357  PROT 6.1*   No results for input(s): LIPASE, AMYLASE in the last 168 hours. No results for input(s): AMMONIA in the last 168 hours. Coagulation Profile: No results for input(s): INR, PROTIME in the last 168 hours. Cardiac Enzymes: Recent Labs  Lab 05/25/18 1036 05/25/18 1540 05/25/18 2041 05/26/18 0512  TROPONINI <0.03 <0.03 <0.03 <0.03   BNP (last 3 results) No  results for input(s): PROBNP in the last 8760 hours. HbA1C: No results for input(s): HGBA1C in the last 72 hours. CBG: Recent Labs  Lab 05/29/18 2022 05/29/18 2327 05/30/18 0343 05/30/18 0737 05/30/18 1129  GLUCAP 88 97 104* 97 108*   Lipid Profile: No results for input(s): CHOL, HDL, LDLCALC, TRIG, CHOLHDL, LDLDIRECT in the last 72 hours. Thyroid Function Tests: No results for input(s): TSH, T4TOTAL, FREET4, T3FREE, THYROIDAB in the last 72 hours. Anemia Panel: No results for input(s): VITAMINB12, FOLATE, FERRITIN, TIBC, IRON, RETICCTPCT in the last 72 hours. Sepsis Labs: No results for input(s): PROCALCITON, LATICACIDVEN in the last 168 hours.  Recent Results (from the past 240 hour(s))  Acid Fast Smear (AFB)     Status: None   Collection Time: 05/23/18  9:01 AM  Result Value Ref Range Status   AFB Specimen Processing Concentration  Final   Acid Fast Smear Negative  Final    Comment: (NOTE) Performed At: [facility] [place], [place] [id number] Rush Farmer MD JH:[id number]    Source (AFB) PLEURAL  Final    Comment: RIGHT Performed at [facility], [place] [address], [place], [place] [address]   Culture, body fluid-bottle     Status: None   Collection Time:  05/23/18  9:01 AM  Result Value Ref Range Status   Specimen Description PLEURAL RIGHT  Final   Special Requests NONE  Final   Culture   Final    NO GROWTH 5 DAYS Performed at [facility] [address], [place], [place] [address]    Report Status 05/28/2018 FINAL  Final  Gram stain     Status: None   Collection Time: 05/23/18  9:01 AM  Result Value Ref Range Status   Specimen Description PLEURAL RIGHT  Final   Special Requests NONE  Final   Gram Stain   Final    FEW WBC PRESENT, PREDOMINANTLY MONONUCLEAR NO ORGANISMS SEEN Performed at [facility], [place] [address] [place], [place] [address]    Report Status 05/23/2018 FINAL  Final  Culture, body fluid-bottle     Status: None (Preliminary result)   Collection Time: 05/29/18 11:57 AM  Result Value Ref Range Status   Specimen Description PLEURAL RIGHT  Final   Special Requests NONE  Final   Culture NO GROWTH < 24 HOURS  Final   Report Status PENDING  Incomplete  Gram stain     Status: None   Collection Time: 05/29/18 11:57 AM  Result Value Ref Range Status   Specimen Description PLEURAL RIGHT  Final   Special Requests NONE  Final   Gram Stain   Final    RARE WBC PRESENT,BOTH PMN AND MONONUCLEAR NO ORGANISMS SEEN Performed at [facility], [place] [address] [place], [place] [address]    Report Status 05/29/2018 FINAL  Final         Radiology Studies: Ct Head Wo Contrast  Result Date: 05/30/2018 CLINICAL DATA:  Altered mental status. History of breast cancer, stroke and alcohol abuse. EXAM: CT HEAD WITHOUT CONTRAST TECHNIQUE: Contiguous axial images were obtained from the base of the skull through the vertex without intravenous contrast. COMPARISON:  CT HEAD July 12, 2005 FINDINGS: BRAIN: No intraparenchymal hemorrhage, mass effect nor midline shift. Moderate parenchymal brain volume loss. Severe vermian atrophy. No hydrocephalus. Old cystic RIGHT basal ganglia infarct with mild ex vacuo dilatation  subjacent ventricle. Patchy to confluent supratentorial white matter hypodensities. No acute large vascular territory  infarcts. No abnormal extra-axial fluid collections. Basal cisterns are patent. VASCULAR: Moderate calcific atherosclerosis of the carotid siphons. SKULL: No skull fracture. No significant scalp soft tissue swelling. SINUSES/ORBITS: Mild lobulated mucosal thickening. No paranasal sinus air-fluid levels. Mastoid air cells are well aerated.The included ocular globes and orbital contents are non-suspicious. OTHER: None. IMPRESSION: 1. No acute intracranial process. 2. Old RIGHT basal ganglia infarct. Moderate chronic small vessel ischemic changes. 3. Mild parenchymal brain volume loss, severe vermian atrophy. Electronically Signed   By: Elon Alas M.D.   On: 05/30/2018 00:40   Dg Chest Port 1 View  Result Date: 05/29/2018 CLINICAL DATA:  67 year old male status post ultrasound-guided right side thoracentesis this morning. Status post fall with right chest wall trauma earlier this month. EXAM: PORTABLE CHEST 1 VIEW COMPARISON:  Portable chest 05/28/2018 and earlier. FINDINGS: Portable AP semi upright view at 1219 hours. Decreased veiling opacity in the right lung compared to yesterday. No pneumothorax. Stable cardiac size and mediastinal contours. Stable right PICC line and visible enteric feeding tube. Prior CABG. Left common carotid artery region vascular stent. Stable and negative left lung ventilation. Numerous displaced right lateral rib fractures again noted. IMPRESSION: Decreased right pleural effusion and no pneumothorax following thoracentesis. Electronically Signed   By: Genevie Ann M.D.   On: 05/29/2018 12:34   US Thoracentesis Asp Pleural Space W/img Guide  Result Date: 05/29/2018 INDICATION: Symptomatic right sided pleural effusion EXAM: US THORACENTESIS ASP PLEURAL SPACE W/IMG GUIDE- PORTABLE COMPARISON:  None. MEDICATIONS: 10 cc 1% lidocaine COMPLICATIONS: None immediate.  TECHNIQUE: Informed written consent was obtained from the patient after a discussion of the risks, benefits and alternatives to treatment. A timeout was performed prior to the initiation of the procedure. Initial ultrasound scanning demonstrates a right pleural effusion. The lower chest was prepped and draped in the usual sterile fashion. 1% lidocaine was used for local anesthesia. Under direct ultrasound guidance, a 19 gauge, 7-cm, Yueh catheter was introduced. An ultrasound image was saved for documentation purposes. The thoracentesis was performed. The catheter was removed and a dressing was applied. The patient tolerated the procedure well without immediate post procedural complication. The patient was escorted to have an upright chest radiograph. FINDINGS: A total of approximately 180 cc of blood tinged fluid was removed. Requested samples were sent to the laboratory. IMPRESSION: Successful ultrasound-guided right sided thoracentesis yielding 180 cc of pleural fluid. Read by Lavonia Drafts [facility] Electronically Signed   By: Jerilynn Mages.  Shick M.D.   On: 05/29/2018 13:58        Scheduled Meds: . aspirin  81 mg Per Tube Daily  . chlorhexidine gluconate (MEDLINE KIT)  15 mL Mouth Rinse BID  . Chlorhexidine Gluconate Cloth  6 each Topical Daily  . enoxaparin (LOVENOX) injection  40 mg Subcutaneous Q24H  . feeding supplement (PRO-STAT SUGAR FREE 64)  30 mL Per Tube Daily  . folic acid  1 mg Per Tube Daily  . free water  100 mL Per Tube Q8H  . ibuprofen  400 mg Oral Q6H  . lidocaine  2 patch Transdermal Q24H  . mouth rinse  15 mL Mouth Rinse BID  . nicotine  14 mg Transdermal Daily  . polyethylene glycol  17 g Per Tube Daily  . potassium chloride  40 mEq Per Tube BID  . QUEtiapine  100 mg Per Tube BID  . sodium chloride flush  10-40 mL Intracatheter Q12H   Continuous Infusions: . sodium chloride 10 mL/hr (05/26/18 0522)  . feeding supplement (  OSMOLITE 1.5 CAL) 1,000 mL (05/30/18 1058)     LOS: 16  days    Time spent:35 mins. More than 50% of that time was spent in counseling and/or coordination of care.      Shelly Coss, MD [facility] Pager [phone]  If 7PM-7AM, please contact night-coverage [email or web address] Password [email or web address] 05/30/2018, 1:14 PM

## 2018-05-30 NOTE — Plan of Care (Signed)

## 2018-05-30 NOTE — Plan of Care (Signed)
  Problem: Elimination: Goal: Will not experience complications related to bowel motility Outcome: Progressing Goal: Will not experience complications related to urinary retention Outcome: Progressing   Problem: Health Behavior/Discharge Planning: Goal: Ability to manage health-related needs will improve Outcome: Not Progressing   Problem: Clinical Measurements: Goal: Ability to maintain clinical measurements within normal limits will improve Outcome: Not Progressing   Problem: Activity: Goal: Risk for activity intolerance will decrease Outcome: Not Progressing   Problem: Nutrition: Goal: Adequate nutrition will be maintained Outcome: Not Progressing   Problem: Coping: Goal: Level of anxiety will decrease Outcome: Not Progressing   Problem: Safety: Goal: Ability to remain free from injury will improve Outcome: Not Progressing   Problem: Education: Goal: Knowledge of disease or condition will improve Outcome: Not Progressing Goal: Understanding of discharge needs will improve Outcome: Not Progressing   Problem: Health Behavior/Discharge Planning: Goal: Ability to identify changes in lifestyle to reduce recurrence of condition will improve Outcome: Not Progressing Goal: Identification of resources available to assist in meeting health care needs will improve Outcome: Not Progressing

## 2018-05-30 NOTE — Progress Notes (Signed)
eLink Physician-Brief Progress Note Patient Name: JACOREY DONAWAY DOB: 1950/10/16 MRN: [id number]   Date of Service  05/30/2018  HPI/Events of Note    eICU Interventions  Hand off: CT head: no acute process, old basal ganglia infarct.  Continue care.      Intervention Category Intermediate Interventions: Diagnostic test evaluation  Elmer Sow 05/30/2018, 3:30 AM

## 2018-05-31 DIAGNOSIS — R410 Disorientation, unspecified: Secondary | ICD-10-CM

## 2018-05-31 LAB — GLUCOSE, CAPILLARY
Glucose-Capillary: 83 mg/dL (ref 70–99)
Glucose-Capillary: 96 mg/dL (ref 70–99)
Glucose-Capillary: 80 mg/dL (ref 70–99)
Glucose-Capillary: 126 mg/dL — ABNORMAL HIGH (ref 70–99)
Glucose-Capillary: 109 mg/dL — ABNORMAL HIGH (ref 70–99)
Glucose-Capillary: 105 mg/dL — ABNORMAL HIGH (ref 70–99)
Glucose-Capillary: 115 mg/dL — ABNORMAL HIGH (ref 70–99)
Glucose-Capillary: 99 mg/dL (ref 70–99)

## 2018-05-31 LAB — PATHOLOGIST SMEAR REVIEW

## 2018-05-31 LAB — CALCIUM, IONIZED: Calcium, Ionized, Serum: 4.9 mg/dL (ref 4.5–5.6)

## 2018-05-31 LAB — MAGNESIUM: Magnesium: 1.9 mg/dL (ref 1.7–2.4)

## 2018-05-31 MED ORDER — OLANZAPINE 2.5 MG PO TABS
2.5000 mg | ORAL_TABLET | Freq: Every day | ORAL | Status: DC
  Administered 2018-05-31 – 2018-06-08 (×8): 2.5 mg via ORAL
  Filled 2018-05-31 (×10): qty 1

## 2018-05-31 MED ORDER — OLANZAPINE 2.5 MG PO TABS
2.5000 mg | ORAL_TABLET | Freq: Every day | ORAL | Status: DC | PRN
  Administered 2018-06-01: 2.5 mg via ORAL
  Filled 2018-05-31 (×2): qty 1

## 2018-05-31 NOTE — Care Management Important Message (Signed)
Important Message  Patient Details  Name: [name] Pace MRN: [id number] Date of Birth: 01/08/1951   Medicare Important Message Given:  Yes Due to illness patient unable to sign. Unsigned copy left at bedside.   Ermelinda Eckert P Cardarius Senat 05/31/2018, 2:03 PM

## 2018-05-31 NOTE — Progress Notes (Signed)
Nutrition Follow-up  DOCUMENTATION CODES:   Non-severe (moderate) malnutrition in context of social or environmental circumstances  INTERVENTION:    Continue Osmolite 1.5 at goal rate of 60 ml/hr  Prostat liquid protein 30 ml daily via tube   Provides 2260 kcals, 107 gm protein, 1094 ml free water daily  NUTRITION DIAGNOSIS:   Moderate Malnutrition related to social / environmental circumstances(EtOH and tobacco abuse) as evidenced by mild fat depletion, mild muscle depletion, ongoing  GOAL:   Patient will meet greater than or equal to 90% of their needs, met  MONITOR:   TF tolerance, Diet advancement, Labs, Skin, Weight trends  ASSESSMENT:   67 yo male admitted with right-sided rib fractures s/p fall, also wit small pneumothorax on 12/9. On 12/13, pt with decline requiring intubation. PMH includes CAD, stroke, EtOH and tobacco abuse, hyponatremia   12/09 Admit post fall with rib fractures, small PTX 12/09 CIWA protocol with EtOH withdrawal 12/13 Rapid response, transferred to ICU Intubated, TF initiated 12/18 Self-Extubated, TF d/c, NPO 12/20 NPO, Cortrak placed post-pyloric   RD visited pt's room today; RN present. Osmolite 1.5 infusing at goal rate of 60 ml/hr via Cortrak tube. SLP attempted to assess pt today, however, he was too lethargic.  Psychiatry note 12/26 reviewed Pt does not meet criteria for inpatient psych admission. Labs & medications reviewed. CO2 20 (L). CBG's [id number].  Diet Order:   Diet Order            Diet NPO time specified Except for: Other (See Comments)  Diet effective ____             EDUCATION NEEDS:   Not appropriate for education at this time  Skin:  Skin Assessment: Skin Integrity Issues:(no pressure injury noted) Skin Integrity Issues:: Other (Comment) Other: burn marks on feet  Last BM:  12/26  Height:   Ht Readings from Last 1 Encounters:  05/18/18 6' 2"  (1.88 m)   Weight:   Wt Readings from Last 1  Encounters:  05/31/18 66.8 kg   BMI:  Body mass index is 18.91 kg/m.  Estimated Nutritional Needs:   Kcal:  2100-2300  Protein:  105-120 gm  Fluid:  2.1-2.3 L  Arthur Holms, RD, LDN Pager #: [phone] After-Hours Pager #: [phone]

## 2018-05-31 NOTE — Progress Notes (Signed)
SLP Cancellation Note  Patient Details Name: [name] Pace MRN: [id number] DOB: Aug 16, 1950   Cancelled treatment:       Reason Eval/Treat Not Completed: Fatigue/lethargy limiting ability to participate; nursing stated pt had been given sedation overnight and is extremely lethargic; ST will continue efforts.   Elvina Sidle, M.S., CCC-SLP 05/31/2018, 12:03 PM

## 2018-05-31 NOTE — Progress Notes (Signed)
Physical Therapy Treatment Patient Details Name: [name] Pace MRN: [id number] DOB: 02-Aug-1950 Today's Date: 05/31/2018    History of Present Illness Patient is a 67 year old male with history of alcohol abuse, CAD, history of CVA presented to ED with severe right-sided chest wall pain after a fall on the night of 12/7. Pt with R rib fxs 5-11. Intubated 12/13, self extubated 12/18.     PT Comments    Pt lethargic and difficult to arouse after agitation and meds last night. Pt with difficulty coughing and with thick mucus suctioned from mouth. Continue to recommend SNF.    Follow Up Recommendations  SNF;Supervision/Assistance - 24 hour     Equipment Recommendations  Other (comment)(TBD)    Recommendations for Other Services       Precautions / Restrictions Precautions Precautions: Fall    Mobility  Bed Mobility Overal bed mobility: Needs Assistance Bed Mobility: Supine to Sit;Sit to Supine     Supine to sit: +2 for safety/equipment;Total assist Sit to supine: +2 for safety/equipment;Total assist   General bed mobility comments: assist for all aspects  Transfers                 General transfer comment: unsafe to perform today  Ambulation/Gait                 Stairs             Wheelchair Mobility    Modified Rankin (Stroke Patients Only)       Balance Overall balance assessment: Needs assistance;History of Falls Sitting-balance support: Feet supported Sitting balance-Leahy Scale: Zero Sitting balance - Comments: Sat EOB x 10-12 minutes with max to total assist due to heavy posterior lean. Postural control: Posterior lean                                  Cognition Arousal/Alertness: Lethargic;Suspect due to medications Behavior During Therapy: Flat affect   Area of Impairment: Safety/judgement;Following commands                       Following Commands: (pt not following commands) Safety/Judgement:  Decreased awareness of safety;Decreased awareness of deficits     General Comments: pt given medication due to agitation last night, limited ability to participate, pt with garbled speech, eyes closed most of session      Exercises      General Comments        Pertinent Vitals/Pain Pain Assessment: Faces Faces Pain Scale: No hurt    Home Living                      Prior Function            PT Goals (current goals can now be found in the care plan section) Acute Rehab PT Goals Patient Stated Goal: unable to state Progress towards PT goals: Not progressing toward goals - comment    Frequency    Min 2X/week      PT Plan Current plan remains appropriate    Co-evaluation PT/OT/SLP Co-Evaluation/Treatment: Yes Reason for Co-Treatment: Necessary to address cognition/behavior during functional activity;For patient/therapist safety PT goals addressed during session: Balance;Mobility/safety with mobility OT goals addressed during session: Strengthening/ROM      AM-PAC PT "6 Clicks" Mobility   Outcome Measure  Help needed turning from your back to your side while in a flat bed without using bedrails?:  Total Help needed moving from lying on your back to sitting on the side of a flat bed without using bedrails?: Total Help needed moving to and from a bed to a chair (including a wheelchair)?: Total Help needed standing up from a chair using your arms (e.g., wheelchair or bedside chair)?: Total Help needed to walk in hospital room?: Total Help needed climbing 3-5 steps with a railing? : Total 6 Click Score: 6    End of Session   Activity Tolerance: Patient limited by lethargy Patient left: in bed;with call bell/phone within reach;with restraints reapplied;with nursing/sitter in room(sittter) Nurse Communication: Mobility status PT Visit Diagnosis: Unsteadiness on feet (R26.81);Repeated falls (R29.6);Difficulty in walking, not elsewhere classified  (R26.2);Muscle weakness (generalized) (M62.81)     Time: 2449-7530 PT Time Calculation (min) (ACUTE ONLY): 26 min  Charges:  $Therapeutic Activity: 8-22 mins                     [place] Pager [phone] Office [place] 05/31/2018, 11:34 AM

## 2018-05-31 NOTE — Progress Notes (Signed)
PROGRESS NOTE    [name] Pace  BLT:[id number] DOB: 10/12/1950 DOA: 05/13/2018 PCP: System, Provider Not In   Brief Narrative: Patient is a 67 year old smoker with past medical history of CVA, prostate cancer, anxiety, alcohol abuse, substance abuse, hyperlipidemia admitted on 12/8 after a fall with right-sided chest pain.  Work-up revealed multiple right-sided rib fractures and small pneumothorax which did not require a chest tube.  He was initially admitted under hospitalist service but required pulmonary evaluation and admiitted under their service due  to tachypnea and acute respiratory failure, alcohol withdrawal.  Patient was intubated on 12/13.  He required Precedex drip, Levophed when he was on pulmonary service.  Also underwent right-sided thoracentesis with removal of 450 of clear fluid on 12/18.  He self extubated on 12/18.  Continue to be agitated, needed sitter.  Finally off Precedex drips.  Also had to be started on tube feeding due to inability to take by mouth. TRH resumed  on 05/30/2018. Patient continues to remain agitated, altered.  I have requested for psychiatric consultation today regarding  his persistent delirium.  Assessment & Plan:   Principal Problem:   Multiple rib fractures Active Problems:   Hyponatremia   Alcohol abuse   CAD (coronary artery disease)   History of stroke   Pneumothorax, closed, traumatic, initial encounter   Delirium tremens ([place])   Acute respiratory failure (HCC)   Shock circulatory (HCC)   Malnutrition of moderate degree   Encephalopathy acute  Agitation/ongoing delirium: Thought to be secondary to pain, alcohol/opiod  withdrawal,ICU delirium  Also has history of CVA.  Might have underlying vascular dementia.  Continue PRN Haldol.  On Seroquel.  Continue pain management.  Sitter at the bedside. Encouraged bright room, family involvement. Consulted pscyhiatry today.  He is on Seroquel. Last CT imaging of the head on 05/27/2018 did not show  any acute intracranial abnormalities. Continue  PRN Haldol for agitation but hold benzodiazepines.  Chronic alcoholism/substance abuse: Alcohol withdrawal suspected .continue on daily thiamine, folic acid.  Continue to monitor CIWA. Confirmed with daughter that he had been using drugs including IV for last several years.  Dysphagia/inability to swallow: Secondary to ongoing agitation/disorientation delirium .started on tube feeding.  Being followed by speech.  Current recommendation is n.p.o.  Mechanical fall/rib fractures: Multiple rib fractures noticed on admission.  Continue pain management.  Acute hypoxic respiratory failure: In the setting of multiple rib fractures, atelectasis, pneumonia, pneumothorax.  Initially required intubation.  Self extubated  on 12/18.  Never required chest tube.  Hospital course was also complicated by volume overload.  Continue supplemental oxygen as needed.  Currently respiratory status looks stable.  Completed course of Levaquin.  Currently afebrile.  Layering effusion/hemothorax: On right side.  History of multiple rib fracture.  Status post 4 on 12/18 with removal of 40 cc of fluid.  Exudate.  Cytology showed reactive atypical mesothelial cells.  Again underwent right-sided thoracentesis on 05/27/2018 with removal of 180 cc of bloody tinged fluid.  Hypokalemia/hypomagnesemia: Supplemented.  Deconditioning/debility: Ordered PT/OT.  Not a candidate for LTAC.  Plan for skilled nursing facility.Will request for social worker consultation   Nutrition Problem: Moderate Malnutrition Etiology: social / environmental circumstances(EtOH and tobacco abuse)      DVT prophylaxis: Lovenox Code Status: Full Family Communication: Discussed extensively with the daughter on 05/30/18 Disposition Plan: Skilled nursing facility after resolution of agitation, starting of oral feeding   Consultants: PCCM  Procedures: Intubation  Antimicrobials:  None  Subjective: Patient seen and examined at bedside  this morning.  Remains hemodynamically stable.  Looked drowsy this morning.    Objective: Vitals:   05/31/18 0700 05/31/18 0900 05/31/18 1000 05/31/18 1100  BP:   101/71   Pulse: 81 90 87 86  Resp: (!) 23 17 (!) 30 (!) 25  Temp:   98.1 F (36.7 C)   TempSrc:   Oral   SpO2: 100% 97% 98% 99%  Weight:      Height:        Intake/Output Summary (Last 24 hours) at 05/31/2018 1305 Last data filed at 05/31/2018 1230 Gross per 24 hour  Intake 411.24 ml  Output 1725 ml  Net -1313.76 ml   Filed Weights   05/29/18 0500 05/30/18 0353 05/31/18 0427  Weight: 67.7 kg 66.4 kg 66.8 kg    Examination:  General exam: Drowsy,sedated /debilitated HEENT:PERRL,Oral mucosa moist, Ear/Nose normal on gross exam Respiratory system: Bilateral decreased air entry in the bases Cardiovascular system: S1 & S2 heard, RRR. No JVD, murmurs, rubs, gallops or clicks. No pedal edema. Gastrointestinal system: Abdomen is nondistended, soft and nontender. No organomegaly or masses felt. Normal bowel sounds heard. Central nervous system:Not alert, not oriented. No focal neurological deficits. Extremities: No edema, no clubbing ,no cyanosis, distal peripheral pulses palpable. Skin: No rashes, lesions or ulcers,no icterus ,no pallor Psychiatry: Judgement and insight appear impaired   Data Reviewed: I have personally reviewed following labs and imaging studies  CBC: Recent Labs  Lab 05/26/18 0512 05/27/18 0434 05/28/18 0518 05/29/18 0548 05/30/18 1302  WBC 7.2 9.3 9.3 9.3 9.4  NEUTROABS 5.1 6.4 6.6 6.5 6.3  HGB 11.1* 11.7* 11.8* 12.1* 11.4*  HCT 33.7* 35.6* 35.5* 36.9* 34.4*  MCV 105.0* 105.3* 106.3* 104.8* 102.4*  PLT 541* 596* 624* 621* 891*   Basic Metabolic Panel: Recent Labs  Lab 05/25/18 0446  05/25/18 2350 05/26/18 0512 05/26/18 0753 05/27/18 0434 05/28/18 0518 05/29/18 0451 05/30/18 0326 05/30/18 1302 05/31/18 0423  NA  --     < > 141  --   --  144 144 138  --  136  --   K  --    < > 2.7*  --  3.6 3.3* 3.4* 3.1*  --  4.6  --   CL  --    < > 96*  --   --  108 111 115*  --  109  --   CO2  --    < > 33*  --   --  26 22 16*  --  20*  --   GLUCOSE  --    < > 166*  --   --  116* 122* 91  --  118*  --   BUN  --    < > 16  --   --  _0 --  18  --   CREATININE  --    < > 0.60*  --   --  0.63 0.52* 0.39*  --  0.56*  --   CALCIUM  --    < > 8.3*  --   --  8.5* 8.5* 6.6*  --  8.2*  --   MG 1.8  --   --  2.4  --  2.2 2.0 1.5* 2.4  --  1.9  PHOS 3.7  --   --  2.9  --  2.9 3.3 3.1  --   --   --    < > = values in this interval not displayed.   GFR: Estimated Creatinine Clearance:  87 mL/min (A) (by C-G formula based on SCr of 0.56 mg/dL (L)). Liver Function Tests: Recent Labs  Lab 05/28/18 1357  PROT 6.1*   No results for input(s): LIPASE, AMYLASE in the last 168 hours. No results for input(s): AMMONIA in the last 168 hours. Coagulation Profile: No results for input(s): INR, PROTIME in the last 168 hours. Cardiac Enzymes: Recent Labs  Lab 05/25/18 1036 05/25/18 1540 05/25/18 2041 05/26/18 0512  TROPONINI <0.03 <0.03 <0.03 <0.03   BNP (last 3 results) No results for input(s): PROBNP in the last 8760 hours. HbA1C: No results for input(s): HGBA1C in the last 72 hours. CBG: Recent Labs  Lab 05/30/18 1958 05/30/18 2352 05/31/18 0424 05/31/18 0753 05/31/18 1156  GLUCAP 83 96 80 126* 109*   Lipid Profile: No results for input(s): CHOL, HDL, LDLCALC, TRIG, CHOLHDL, LDLDIRECT in the last 72 hours. Thyroid Function Tests: No results for input(s): TSH, T4TOTAL, FREET4, T3FREE, THYROIDAB in the last 72 hours. Anemia Panel: No results for input(s): VITAMINB12, FOLATE, FERRITIN, TIBC, IRON, RETICCTPCT in the last 72 hours. Sepsis Labs: No results for input(s): PROCALCITON, LATICACIDVEN in the last 168 hours.  Recent Results (from the past 240 hour(s))  Acid Fast Smear (AFB)     Status: None   Collection  Time: 05/23/18  9:01 AM  Result Value Ref Range Status   AFB Specimen Processing Concentration  Final   Acid Fast Smear Negative  Final    Comment: (NOTE) Performed At: [facility] [place], [place] [id number] Rush Farmer MD WO:[id number]    Source (AFB) PLEURAL  Final    Comment: RIGHT Performed at [facility], [place] [address] [place], [place] [address]   Culture, body fluid-bottle     Status: None   Collection Time: 05/23/18  9:01 AM  Result Value Ref Range Status   Specimen Description PLEURAL RIGHT  Final   Special Requests NONE  Final   Culture   Final    NO GROWTH 5 DAYS Performed at [place] [address], [place], [place] [address]    Report Status 05/28/2018 FINAL  Final  Gram stain     Status: None   Collection Time: 05/23/18  9:01 AM  Result Value Ref Range Status   Specimen Description PLEURAL RIGHT  Final   Special Requests NONE  Final   Gram Stain   Final    FEW WBC PRESENT, PREDOMINANTLY MONONUCLEAR NO ORGANISMS SEEN Performed at [facility], [place] [address], [place], [place] [address]    Report Status 05/23/2018 FINAL  Final  Culture, body fluid-bottle     Status: None (Preliminary result)   Collection Time: 05/29/18 11:57 AM  Result Value Ref Range Status   Specimen Description PLEURAL RIGHT  Final   Special Requests NONE  Final   Culture   Final    NO GROWTH 2 DAYS Performed at [facility], [place] [address], [place], [place] [address]    Report Status PENDING  Incomplete  Gram stain     Status: None   Collection Time: 05/29/18 11:57 AM  Result Value Ref Range Status   Specimen Description PLEURAL RIGHT  Final   Special Requests NONE  Final   Gram Stain   Final    RARE WBC PRESENT,BOTH PMN AND MONONUCLEAR NO ORGANISMS SEEN Performed at [facility], [place] [address] [place], [place] [address]    Report Status 05/29/2018 FINAL  Final  Radiology Studies: Ct Head Wo  Contrast  Result Date: 05/30/2018 CLINICAL DATA:  Altered mental status. History of breast cancer, stroke and alcohol abuse. EXAM: CT HEAD WITHOUT CONTRAST TECHNIQUE: Contiguous axial images were obtained from the base of the skull through the vertex without intravenous contrast. COMPARISON:  CT HEAD July 12, 2005 FINDINGS: BRAIN: No intraparenchymal hemorrhage, mass effect nor midline shift. Moderate parenchymal brain volume loss. Severe vermian atrophy. No hydrocephalus. Old cystic RIGHT basal ganglia infarct with mild ex vacuo dilatation subjacent ventricle. Patchy to confluent supratentorial white matter hypodensities. No acute large vascular territory infarcts. No abnormal extra-axial fluid collections. Basal cisterns are patent. VASCULAR: Moderate calcific atherosclerosis of the carotid siphons. SKULL: No skull fracture. No significant scalp soft tissue swelling. SINUSES/ORBITS: Mild lobulated mucosal thickening. No paranasal sinus air-fluid levels. Mastoid air cells are well aerated.The included ocular globes and orbital contents are non-suspicious. OTHER: None. IMPRESSION: 1. No acute intracranial process. 2. Old RIGHT basal ganglia infarct. Moderate chronic small vessel ischemic changes. 3. Mild parenchymal brain volume loss, severe vermian atrophy. Electronically Signed   By: Elon Alas M.D.   On: 05/30/2018 00:40        Scheduled Meds: . aspirin  81 mg Per Tube Daily  . chlorhexidine gluconate (MEDLINE KIT)  15 mL Mouth Rinse BID  . Chlorhexidine Gluconate Cloth  6 each Topical Daily  . enoxaparin (LOVENOX) injection  40 mg Subcutaneous Q24H  . feeding supplement (PRO-STAT SUGAR FREE 64)  30 mL Per Tube Daily  . folic acid  1 mg Per Tube Daily  . free water  100 mL Per Tube Q8H  . ibuprofen  400 mg Oral Q6H  . lidocaine  2 patch Transdermal Q24H  . mouth rinse  15 mL Mouth Rinse BID  . nicotine  14 mg Transdermal Daily  . polyethylene glycol  17 g Per Tube Daily  .  QUEtiapine  100 mg Per Tube BID  . sodium chloride flush  10-40 mL Intracatheter Q12H   Continuous Infusions: . sodium chloride 10 mL/hr (05/26/18 0522)  . feeding supplement (OSMOLITE 1.5 CAL) 1,000 mL (05/30/18 1058)     LOS: 17 days    Time spent:35 mins. More than 50% of that time was spent in counseling and/or coordination of care.      Shelly Coss, MD [facility] Pager [phone]  If 7PM-7AM, please contact night-coverage [email or web address] Password [email or web address] 05/31/2018, 1:05 PM

## 2018-05-31 NOTE — Progress Notes (Signed)
Occupational Therapy Treatment Patient Details Name: [name] Pace MRN: [id number] DOB: May 24, 1951 Today's Date: 05/31/2018    History of present illness Patient is a 67 year old male with history of alcohol abuse, CAD, history of CVA presented to ED with severe right-sided chest wall pain after a fall on the night of 12/7. Pt with R rib fxs 5-11. Intubated 12/13, self extubated 12/18.    OT comments  Pt with lethargy this session, likely due to medications as pt was agitated last night. Pt requiring +2 total assist for bed mobility. Sat EOB with max assist with heavy posterior lean. Requiring total assist for oral care and to wash face. Continue to recommend SNF.  Follow Up Recommendations  SNF;Supervision/Assistance - 24 hour    Equipment Recommendations  3 in 1 bedside commode(RW)    Recommendations for Other Services      Precautions / Restrictions Precautions Precautions: Fall       Mobility Bed Mobility Overal bed mobility: Needs Assistance Bed Mobility: Supine to Sit;Sit to Supine     Supine to sit: +2 for safety/equipment;Total assist Sit to supine: +2 for safety/equipment;Total assist   General bed mobility comments: assist for all aspects  Transfers                 General transfer comment: unsafe to perform today    Balance Overall balance assessment: Needs assistance;History of Falls Sitting-balance support: Feet supported Sitting balance-Leahy Scale: Zero Sitting balance - Comments: heavy posterior lean Postural control: Posterior lean                                 ADL either performed or assessed with clinical judgement   ADL Overall ADL's : Needs assistance/impaired     Grooming: Oral care;Bed level;Total assistance;Wash/dry Biomedical engineer      Cognition Arousal/Alertness: Lethargic;Suspect due to medications Behavior During Therapy:  Flat affect   Area of Impairment: Safety/judgement;Following commands                       Following Commands: (pt not following commands) Safety/Judgement: Decreased awareness of safety;Decreased awareness of deficits     General Comments: pt given medication due to agitation last night, limited ability to participate, pt with garbled speech, eyes closed most of session        Exercises     Shoulder Instructions       General Comments      Pertinent Vitals/ Pain       Pain Assessment: Faces Faces Pain Scale: No hurt  Home Living                                          Prior Functioning/Environment              Frequency  Min 2X/week        Progress Toward Goals  OT Goals(current goals can now be found in the care plan section)  Progress towards OT goals: Not progressing toward goals - comment(lethargy)  Acute Rehab OT  Goals Patient Stated Goal: unable to state OT Goal Formulation: Patient unable to participate in goal setting Time For Goal Achievement: 06/12/18 Potential to Achieve Goals: [place] Discharge plan remains appropriate    Co-evaluation    PT/OT/SLP Co-Evaluation/Treatment: Yes Reason for Co-Treatment: Necessary to address cognition/behavior during functional activity;For patient/therapist safety   OT goals addressed during session: Strengthening/ROM      AM-PAC OT "6 Clicks" Daily Activity     Outcome Measure   Help from another person eating meals?: Total Help from another person taking care of personal grooming?: Total Help from another person toileting, which includes using toliet, bedpan, or urinal?: Total Help from another person bathing (including washing, rinsing, drying)?: Total Help from another person to put on and taking off regular upper body clothing?: Total Help from another person to put on and taking off regular lower body clothing?: Total 6 Click Score: 6    End of Session    OT  Visit Diagnosis: Unsteadiness on feet (R26.81);Muscle weakness (generalized) (M62.81)   Activity Tolerance Patient limited by lethargy   Patient Left in bed;with call bell/phone within reach;with restraints reapplied;with nursing/sitter in room   Nurse Communication          Time: 1655-3748 OT Time Calculation (min): 28 min  Charges: OT General Charges $OT Visit: 1 Visit OT Treatments $Therapeutic Activity: 8-22 mins  Nestor Lewandowsky, OTR/L Acute Rehabilitation Services Pager: [phone] Office: [phone]   Malka So 05/31/2018, 10:16 AM

## 2018-05-31 NOTE — Consult Note (Signed)
[facility] Face-to-Face Psychiatry Consult   Reason for Consult:  "Persistent agitation. History of substance abuse, alcohol abuse. Severe alcohol withdrawal. Delirium. Prolonged hospitalization." Referring Physician:  Dr. Tawanna Solo Patient Identification: [name] Pace MRN:  [id number] Principal Diagnosis: Delirium Diagnosis:  Principal Problem:   Multiple rib fractures Active Problems:   Hyponatremia   Alcohol abuse   CAD (coronary artery disease)   History of stroke   Pneumothorax, closed, traumatic, initial encounter   Delirium tremens ([place])   Acute respiratory failure ([place])   Shock circulatory ([place])   Malnutrition of moderate degree   Encephalopathy acute   Total Time spent with patient: 1 hour  Subjective:   [name] Pace is a 67 y.o. male patient admitted with multiple fib fractures after a fall.  HPI:   Per chart review, patient was admitted on 12/9 with multiple right sided rib fractures after a fall. He was intubated on 12/13 for acute respiratory failure. He was found to have a small pneumothorax that did not require a chest tube. He underwent right sided thoracentesis on 12/18. He self-extubated on 12/18. He has been agitated and required Precedex drip as well as physical restraints. He has been receiving Haldol 2 mg q 6 hours PRN (x 1 dose over the past 24 hours) and received 8 mg of Ativan over the past 24 hours. He also received treatment for alcohol withdrawal. He is recommended for SNF placement due to physical deconditioning.   On interview, [name] Pace is oriented to person, month and day. He is calm and appropriate in interview. His speech is slurred so it is difficult to understand him at times. He does not know the reason he is in the hospital. He mentions getting "cut 5 times" but it is unclear if he is referring to recently or in the past. He endorses a history of depression and anxiety but reports that his mood has been stable. He denies SI, HI or AVH. He minimizes  his alcohol use. He reports drinking 3-4 beers daily. He denies a history of seizures or DTs.   Past Psychiatric History: Alcohol abuse and anxiety.   Risk to Self:  None. Denies SI. Risk to Others:  None. Denies HI.  Prior Inpatient Therapy:  Denies  Prior Outpatient Therapy:  Denies   Past Medical History:  Past Medical History:  Diagnosis Date  . Alcohol abuse   . Anxiety   . Breast cancer ([place]) Left breast   Per patient diagnosed in early 40's.   . Coronary artery disease   . High cholesterol   . Prostate cancer ([place])    per patient diagnosed in early 24's  . Stroke [facility])     Past Surgical History:  Procedure Laterality Date  . CAROTID STENT    . CORONARY ARTERY BYPASS GRAFT    . FEMUR IM NAIL Left 07/02/2014   Procedure: INTRAMEDULLARY (IM) NAIL FEMORAL;  Surgeon: Elie Goody, MD;  Location: [facility];  Service: Orthopedics;  Laterality: Left;  . I&D EXTREMITY Right 02/10/2016   Procedure: IRRIGATION AND DEBRIDEMENT EXTREMITY;  Surgeon: Dorna Leitz, MD;  Location: [place];  Service: Orthopedics;  Laterality: Right;  . TENDON REPAIR Right 02/10/2016   Procedure: Anterior TENDON REPAIR and wound exploration.;  Surgeon: Dorna Leitz, MD;  Location: [place];  Service: Orthopedics;  Laterality: Right;   Family History:  Family History  Problem Relation Age of Onset  . Dementia Mother    Family Psychiatric  History: Mother-dementia  Social History:  Social  History   Substance and Sexual Activity  Alcohol Use Yes   Comment: 4-5 beers a day     Social History   Substance and Sexual Activity  Drug Use No    Social History   Socioeconomic History  . Marital status: Married    Spouse name: Not on file  . Number of children: Not on file  . Years of education: Not on file  . Highest education level: Not on file  Occupational History  . Not on file  Social Needs  . Financial resource strain: Not on file  . Food insecurity:    Worry: Not on file    Inability: Not on  file  . Transportation needs:    Medical: Not on file    Non-medical: Not on file  Tobacco Use  . Smoking status: Current Every Day Smoker    Packs/day: 1.00    Years: 42.00    Pack years: 42.00    Types: Cigarettes  . Smokeless tobacco: Never Used  Substance and Sexual Activity  . Alcohol use: Yes    Comment: 4-5 beers a day  . Drug use: No  . Sexual activity: Not on file  Lifestyle  . Physical activity:    Days per week: Not on file    Minutes per session: Not on file  . Stress: Not on file  Relationships  . Social connections:    Talks on phone: Not on file    Gets together: Not on file    Attends religious service: Not on file    Active member of club or organization: Not on file    Attends meetings of clubs or organizations: Not on file    Relationship status: Not on file  Other Topics Concern  . Not on file  Social History Narrative  . Not on file   Additional Social History: He has a history of alcohol abuse.     Allergies:   Allergies  Allergen Reactions  . Penicillins Anaphylaxis    throat    Labs:  Results for orders placed or performed during the hospital encounter of 05/13/18 (from the past 48 hour(s))  Lactate dehydrogenase (pleural or peritoneal fluid)     Status: Abnormal   Collection Time: 05/29/18 11:57 AM  Result Value Ref Range   LD, Fluid 253 (H) 3 - 23 U/L    Comment: (NOTE) Results should be evaluated in conjunction with serum values    Fluid Type-FLDH Pleural R     Comment: Performed at [facility] [address] [place], [place] [address]  Body fluid cell count with differential     Status: Abnormal   Collection Time: 05/29/18 11:57 AM  Result Value Ref Range   Fluid Type-FCT Pleural R    Color, Fluid RED    Appearance, Fluid TURBID (A) CLEAR   WBC, Fluid 395 0 - 1,000 cu mm   Neutrophil Count, Fluid 1 0 - 25 %   Lymphs, Fluid 64 %   Monocyte-Macrophage-Serous Fluid 33 (L) 50 - 90 %   Eos, Fluid 2 %    Comment:  Performed at [facility] [address] [place], [place] [address]  Protein, pleural or peritoneal fluid     Status: None   Collection Time: 05/29/18 11:57 AM  Result Value Ref Range   Total protein, fluid 3.4 g/dL    Comment: (NOTE) No normal range established for this test Results should be evaluated in conjunction with serum values  Fluid Type-FTP Pleural R     Comment: Performed at [facility], [place] [address], [place], [place] [address]  Culture, body fluid-bottle     Status: None (Preliminary result)   Collection Time: 05/29/18 11:57 AM  Result Value Ref Range   Specimen Description PLEURAL RIGHT    Special Requests NONE    Culture      NO GROWTH < 24 HOURS Performed at [facility] [address], [place], [place] [address]    Report Status PENDING   Gram stain     Status: None   Collection Time: 05/29/18 11:57 AM  Result Value Ref Range   Specimen Description PLEURAL RIGHT    Special Requests NONE    Gram Stain      RARE WBC PRESENT,BOTH PMN AND MONONUCLEAR NO ORGANISMS SEEN Performed at [facility], [place] [address] [place], [place] [address]    Report Status 05/29/2018 FINAL   Pathologist smear review     Status: None   Collection Time: 05/29/18 11:57 AM  Result Value Ref Range   Path Review Likely peripheral blood contamination     Comment: Reviewed by Lennox Solders. Lyndon Code, M.D. 05/31/18 Performed at [facility], [place] [address], [place], [place] [address]   Glucose, capillary     Status: None   Collection Time: 05/29/18 12:05 PM  Result Value Ref Range   Glucose-Capillary 92 70 - 99 mg/dL  Glucose, capillary     Status: Abnormal   Collection Time: 05/29/18  4:19 PM  Result Value Ref Range   Glucose-Capillary 115 (H) 70 - 99 mg/dL  Glucose, capillary     Status: None   Collection Time: 05/29/18  8:22 PM  Result Value Ref Range   Glucose-Capillary 88 70 - 99 mg/dL  Glucose, capillary     Status: None   Collection Time:  05/29/18 11:27 PM  Result Value Ref Range   Glucose-Capillary 97 70 - 99 mg/dL   Comment 1 Notify RN   Magnesium     Status: None   Collection Time: 05/30/18  3:26 AM  Result Value Ref Range   Magnesium 2.4 1.7 - 2.4 mg/dL    Comment: Performed at [facility], [place] [address] [place], [place] [address]  Glucose, capillary     Status: Abnormal   Collection Time: 05/30/18  3:43 AM  Result Value Ref Range   Glucose-Capillary 104 (H) 70 - 99 mg/dL   Comment 1 Notify RN   Glucose, capillary     Status: None   Collection Time: 05/30/18  7:37 AM  Result Value Ref Range   Glucose-Capillary 97 70 - 99 mg/dL  Glucose, capillary     Status: Abnormal   Collection Time: 05/30/18 11:29 AM  Result Value Ref Range   Glucose-Capillary 108 (H) 70 - 99 mg/dL  Basic metabolic panel     Status: Abnormal   Collection Time: 05/30/18  1:02 PM  Result Value Ref Range   Sodium 136 135 - 145 mmol/L   Potassium 4.6 3.5 - 5.1 mmol/L    Comment: NO VISIBLE HEMOLYSIS   Chloride 109 98 - 111 mmol/L   CO2 20 (L) 22 - 32 mmol/L   Glucose, Bld 118 (H) 70 - 99 mg/dL   BUN 18 8 - 23 mg/dL   Creatinine, Ser 0.56 (L) 0.61 - 1.24 mg/dL   Calcium 8.2 (L) 8.9 - 10.3 mg/dL   GFR calc non Af Amer >60 >  60 mL/min   GFR calc Af Amer >60 >60 mL/min   Anion gap 7 5 - 15    Comment: Performed at [facility] [address] [place], [place] [address]  CBC with Differential/Platelet     Status: Abnormal   Collection Time: 05/30/18  1:02 PM  Result Value Ref Range   WBC 9.4 4.0 - 10.5 K/uL   RBC 3.36 (L) 4.22 - 5.81 MIL/uL   Hemoglobin 11.4 (L) 13.0 - 17.0 g/dL   HCT 34.4 (L) 39.0 - 52.0 %   MCV 102.4 (H) 80.0 - 100.0 fL   MCH 33.9 26.0 - 34.0 pg   MCHC 33.1 30.0 - 36.0 g/dL   RDW 12.4 11.5 - 15.5 %   Platelets 608 (H) 150 - 400 K/uL   nRBC 0.0 0.0 - 0.2 %   Neutrophils Relative % 67 %   Neutro Abs 6.3 1.7 - 7.7 K/uL   Lymphocytes Relative 17 %   Lymphs Abs 1.6 0.7 - 4.0 K/uL   Monocytes Relative 9  %   Monocytes Absolute 0.8 0.1 - 1.0 K/uL   Eosinophils Relative 6 %   Eosinophils Absolute 0.6 (H) 0.0 - 0.5 K/uL   Basophils Relative 1 %   Basophils Absolute 0.1 0.0 - 0.1 K/uL   Immature Granulocytes 0 %   Abs Immature Granulocytes 0.04 0.00 - 0.07 K/uL    Comment: Performed at [facility], [place] [address] [place], [place] [address]  Glucose, capillary     Status: None   Collection Time: 05/30/18  4:10 PM  Result Value Ref Range   Glucose-Capillary 91 70 - 99 mg/dL  Glucose, capillary     Status: None   Collection Time: 05/30/18  7:58 PM  Result Value Ref Range   Glucose-Capillary 83 70 - 99 mg/dL  Glucose, capillary     Status: None   Collection Time: 05/30/18 11:52 PM  Result Value Ref Range   Glucose-Capillary 96 70 - 99 mg/dL  Magnesium     Status: None   Collection Time: 05/31/18  4:23 AM  Result Value Ref Range   Magnesium 1.9 1.7 - 2.4 mg/dL    Comment: Performed at [facility], [place] [address] [place], [place] [address]  Glucose, capillary     Status: None   Collection Time: 05/31/18  4:24 AM  Result Value Ref Range   Glucose-Capillary 80 70 - 99 mg/dL  Glucose, capillary     Status: Abnormal   Collection Time: 05/31/18  7:53 AM  Result Value Ref Range   Glucose-Capillary 126 (H) 70 - 99 mg/dL   Comment 1 Notify RN     Current Facility-Administered Medications  Medication Dose Route Frequency Provider Last Rate Last Dose  . 0.9 %  sodium chloride infusion   Intravenous PRN Brand Males, MD 10 mL/hr at 05/26/18 0522 10 mL/hr at 05/26/18 0522  . aspirin chewable tablet 81 mg  81 mg Per Tube Daily Skeet Simmer, RPH   81 mg at 05/30/18 1239  . chlorhexidine gluconate (MEDLINE KIT) (PERIDEX) 0.12 % solution 15 mL  15 mL Mouth Rinse BID Rush Farmer, MD   15 mL at 05/30/18 1113  . Chlorhexidine Gluconate Cloth 2 % PADS 6 each  6 each Topical Daily Rush Farmer, MD   6 each at 05/28/18 2235  . enoxaparin (LOVENOX) injection 40 mg  40 mg  Subcutaneous Q24H Corey Harold, NP   40 mg at  05/30/18 1024  . feeding supplement (OSMOLITE 1.5 CAL) liquid 1,000 mL  1,000 mL Per Tube Continuous Brand Males, MD 60 mL/hr at 05/30/18 1058 1,000 mL at 05/30/18 1058  . feeding supplement (PRO-STAT SUGAR FREE 64) liquid 30 mL  30 mL Per Tube Daily Brand Males, MD   30 mL at 05/30/18 1025  . fentaNYL (SUBLIMAZE) injection 25-100 mcg  25-100 mcg Intravenous Q2H PRN Brand Males, MD   50 mcg at 05/29/18 2257  . folic acid (FOLVITE) tablet 1 mg  1 mg Per Tube Daily Brand Males, MD   1 mg at 05/30/18 1025  . free water 100 mL  100 mL Per Tube Q8H Corey Harold, NP   100 mL at 05/31/18 6808  . haloperidol lactate (HALDOL) injection 2 mg  2 mg Intravenous Q6H PRN Corey Harold, NP   2 mg at 05/31/18 0152  . ibuprofen (ADVIL,MOTRIN) 100 MG/5ML suspension 400 mg  400 mg Oral Q6H Corey Harold, NP   400 mg at 05/31/18 8110  . ipratropium-albuterol (DUONEB) 0.5-2.5 (3) MG/3ML nebulizer solution 3 mL  3 mL Nebulization Q6H PRN Rai, Ripudeep K, MD   3 mL at 05/31/18 0437  . lidocaine (LIDODERM) 5 % 2 patch  2 patch Transdermal Q24H Kipp Brood, MD   2 patch at 05/30/18 1238  . LORazepam (ATIVAN) injection 1-4 mg  1-4 mg Intravenous Q2H PRN Brand Males, MD   2 mg at 05/31/18 3159  . MEDLINE mouth rinse  15 mL Mouth Rinse BID Brand Males, MD   15 mL at 05/30/18 2100  . nicotine (NICODERM CQ - dosed in mg/24 hours) patch 14 mg  14 mg Transdermal Daily Brand Males, MD   14 mg at 05/30/18 1023  . ondansetron (ZOFRAN) tablet 4 mg  4 mg Oral Q6H PRN Rai, Ripudeep K, MD       Or  . ondansetron (ZOFRAN) injection 4 mg  4 mg Intravenous Q6H PRN Rai, Ripudeep K, MD   4 mg at 05/24/18 1042  . polyethylene glycol (MIRALAX / GLYCOLAX) packet 17 g  17 g Per Tube Daily Brand Males, MD   17 g at 05/30/18 1025  . QUEtiapine (SEROQUEL) tablet 100 mg  100 mg Per Tube BID Corey Harold, NP   100 mg at 05/30/18 2059  .  sodium chloride flush (NS) 0.9 % injection 10-40 mL  10-40 mL Intracatheter PRN Rai, Ripudeep K, MD   10 mL at 05/18/18 2200  . sodium chloride flush (NS) 0.9 % injection 10-40 mL  10-40 mL Intracatheter PRN Rush Farmer, MD      . sodium chloride flush (NS) 0.9 % injection 10-40 mL  10-40 mL Intracatheter Q12H Rush Farmer, MD   10 mL at 05/30/18 2101    Musculoskeletal: Strength & Muscle Tone: Generalized weakness. Gait & Station: UTA since patient is lying in bed. Patient leans: N/A  Psychiatric Specialty Exam: Physical Exam  Nursing note and vitals reviewed. Constitutional: He is oriented to person, place, and time. He appears well-developed and well-nourished.  HENT:  Head: Normocephalic and atraumatic.  Neck: Normal range of motion.  Respiratory: Effort normal.  Musculoskeletal: Normal range of motion.  Neurological: He is alert and oriented to person, place, and time.  Oriented to person, month and day.  Psychiatric: He has a normal mood and affect. His behavior is normal. Thought content normal. His speech is slurred. Cognition and memory are impaired. He expresses impulsivity.  Review of Systems  Cardiovascular: Negative for chest pain.  Gastrointestinal: Positive for abdominal pain and nausea. Negative for constipation, diarrhea and vomiting.  Psychiatric/Behavioral: Positive for substance abuse. Negative for depression, hallucinations and suicidal ideas. The patient is not nervous/anxious.   All other systems reviewed and are negative.   Blood pressure 93/79, pulse 88, temperature 98.7 F (37.1 C), temperature source Oral, resp. rate (!) 29, height _0  (1.88 m), weight 66.8 kg, SpO2 100 %.Body mass index is 18.91 kg/m.  General Appearance: Disheveled, elderly, Caucasian male, wearing a hospital gown with unshaved face who is lying in bed. NAD.   Eye Contact:  Good  Speech:  Slow and Slurred  Volume:  Decreased  Mood:  Euthymic  Affect:  Constricted  Thought  Process:  Linear and Descriptions of Associations: Intact  Orientation:  Other:  Oriented to person, month and day.  Thought Content:  Logical  Suicidal Thoughts:  No  Homicidal Thoughts:  No  Memory:  Immediate;   Poor Recent;   Fair Remote;   Fair  Judgement:  Poor  Insight:  Shallow  Psychomotor Activity:  Normal  Concentration:  Concentration: Fair and Attention Span: Fair  Recall:  [employer] of Knowledge:  Fair  Language:  Fair  Akathisia:  No  Handed:  Right  AIMS (if indicated):   N/A  Assets:  Communication Skills Desire for Improvement Housing  ADL's:  Impaired  Cognition: Impaired due to short term memory deficits.   Sleep:   N/A   Assessment:  [name] Pace is a 67 y.o. male who was admitted with multiple fib fractures after a fall. He is oriented to person, month and day. His mental status waxes and wanes throughout interview and at times it is unclear if he is responding to internal stimuli. He is calm today but he has been intermittently agitated. Recommend Zyprexa for agitation and possible hallucinations secondary to medical condition. He denies SI or HI. He does not warrant inpatient psychiatric hospitalization at this time.    Treatment Plan Summary: -Recommend Zyprexa 2.5 mg qhs for agitation/possible hallucinations. Can increase up to 5 mg qhs for symptom management. Can also use 2.5 daily PRN for worsening agitation.  -EKG reviewed and QTc 445 on 12/19. Please closely monitor when starting or increasing QTc prolonging agents.  -Psychiatry will sign off on patient at this time. Please consult psychiatry again as needed.   Disposition: Patient does not meet criteria for psychiatric inpatient admission.  Faythe Dingwall, DO 05/31/2018 11:38 AM

## 2018-06-01 LAB — GLUCOSE, CAPILLARY
GLUCOSE-CAPILLARY: 97 mg/dL (ref 70–99)
Glucose-Capillary: 114 mg/dL — ABNORMAL HIGH (ref 70–99)
Glucose-Capillary: 115 mg/dL — ABNORMAL HIGH (ref 70–99)
Glucose-Capillary: 122 mg/dL — ABNORMAL HIGH (ref 70–99)
Glucose-Capillary: 90 mg/dL (ref 70–99)
Glucose-Capillary: 92 mg/dL (ref 70–99)

## 2018-06-01 LAB — MAGNESIUM: Magnesium: 2 mg/dL (ref 1.7–2.4)

## 2018-06-01 MED ORDER — ORAL CARE MOUTH RINSE
15.0000 mL | Freq: Two times a day (BID) | OROMUCOSAL | Status: DC
  Administered 2018-06-01 – 2018-06-09 (×10): 15 mL via OROMUCOSAL

## 2018-06-01 MED ORDER — IBUPROFEN 100 MG/5ML PO SUSP
400.0000 mg | Freq: Four times a day (QID) | ORAL | Status: DC | PRN
  Administered 2018-06-06: 400 mg via ORAL
  Filled 2018-06-01 (×2): qty 20

## 2018-06-01 MED ORDER — CHLORHEXIDINE GLUCONATE 0.12 % MT SOLN
15.0000 mL | Freq: Two times a day (BID) | OROMUCOSAL | Status: DC
  Administered 2018-06-01 – 2018-06-09 (×13): 15 mL via OROMUCOSAL
  Filled 2018-06-01 (×12): qty 15

## 2018-06-01 NOTE — Progress Notes (Signed)
CSW spoke with the daughter about possible SNF placement. She stated that she would okay with either [facility] or [facility]. She also mentioned that palliative would be speaking with her. CSW stated that we will continue to follow the case.   CSW will continue to monitor.   Domenic Schwab, MSW, [place]

## 2018-06-01 NOTE — Progress Notes (Addendum)
PROGRESS NOTE    [name]  PJK:[id number] DOB: August 13, 1950 DOA: 05/13/2018 PCP: System, Provider Not In   Brief Narrative: Patient is a 67 year old smoker with past medical history of CVA, prostate cancer, anxiety, alcohol abuse, substance abuse, hyperlipidemia admitted on 12/8 after a fall with right-sided chest pain.  Work-up revealed multiple right-sided rib fractures and small pneumothorax which did not require a chest tube.  He was initially admitted under hospitalist service but required pulmonary evaluation and admiitted under their service due  to tachypnea and acute respiratory failure, alcohol withdrawal.  Patient was intubated on 12/13.  He required Precedex drip, Levophed when he was on pulmonary service.  Also underwent right-sided thoracentesis with removal of 450 of clear fluid on 12/18.  He self extubated on 12/18.  Continued to be agitated, needed sitter.  Finally off Precedex drips.  Also had to be started on tube feeding due to persistent inability to take by mouth. TRH resumed care on 05/30/2018. Patient continues to remain agitated, altered.  Requested for psychiatric consultation for persistent delirium.  Awaiting improvement in the mental status.  Assessment & Plan:   Principal Problem:   Delirium Active Problems:   Hyponatremia   Alcohol abuse   CAD (coronary artery disease)   History of stroke   Multiple rib fractures   Pneumothorax, closed, traumatic, initial encounter   Delirium tremens ([place])   Acute respiratory failure ([place])   Shock circulatory ([facility])   Malnutrition of moderate degree   Encephalopathy acute  Agitation/ongoing delirium: Thought to be secondary to pain, alcohol/opiod  withdrawal,ICU delirium.  Also has history of CVA.  Might have underlying vascular dementia.   Sitter at the bedside. Encouraged bright room, family involvement. Consulted pscyhiatry on 05/31/18.  He was on Seroquel and it has been discontinued.  Started on Zyprexa Last CT  imaging of the head on 05/27/2018 did not show any acute intracranial abnormalities. Continue  PRN Haldol for agitation only if necessary and not controlled with PRN zyprexa but hold benzodiazepines.  Chronic alcoholism/substance abuse: Alcohol withdrawal suspected .continue on daily thiamine, folic acid.  Continue to monitor CIWA. Confirmed with daughter that he had been using drugs including IV for last several years.  Dysphagia/inability to swallow: Secondary to ongoing agitation/disorientation/ delirium .Started on tube feeding.  Being followed by speech.  Current recommendation is n.p.o.  Mechanical fall/rib fractures: Multiple rib fractures noticed on admission.  Continue pain management.  Acute hypoxic respiratory failure: In the setting of multiple rib fractures, atelectasis, pneumonia, pneumothorax.  Initially required intubation.  Self extubated  on 12/18.  Never required chest tube.  Hospital course was also complicated by volume overload.  Continue supplemental oxygen as needed.  Currently respiratory status looks stable.  Completed course of Levaquin.  Currently afebrile.  Layering effusion/hemothorax: On right side.  History of multiple rib fracture.  Status post 4 on 12/18 with removal of 40 cc of fluid.  Exudate.  Cytology showed reactive atypical mesothelial cells.  Again underwent right-sided thoracentesis on 05/27/2018 with removal of 180 cc of bloody tinged fluid.  Hypokalemia/hypomagnesemia: Supplemented.  Deconditioning/debility: Ordered PT/OT.Marland Kitchen  Plan for skilled nursing facility but he cant go to SNF with tube feeds .Will request for social worker consultation  Persistent delirium/possible vascular dementia/debility: I have discussed with daughter extensively on phone again today.  Will request for palliative care evaluation for discussion of goals of care.   Nutrition Problem: Moderate Malnutrition Etiology: social / environmental circumstances(EtOH and tobacco  abuse)  DVT prophylaxis: Lovenox Code Status: Full Family Communication: Discussed extensively with the daughter on 05/30/18 Disposition Plan: Hoping for Skilled nursing facility after resolution of agitation, starting of oral feeding.  Do not know how many days will take.  We can try LTACH but he may not be accepted.He is young for consideration of hospice but if  is mental status doesnot improve that we might need to consider in near future. I will put consult request for social Development worker, community.  Also will update daughter today.If she agrees will put palliative care consult.   Consultants: PCCM  Procedures: Intubation  Antimicrobials: None  Subjective: Patient seen and examined at bedside this morning.  Remains hemodynamically stable.  Status might have slightly improved since yesterday.  Less agitated with Zyprexa.  Sitter at the bedside  Objective: Vitals:   05/31/18 1900 05/31/18 2002 06/01/18 0456 06/01/18 0559  BP:  100/67 113/65   Pulse: 95 92    Resp: (!) 24 (!) 24 20 (!) 24  Temp:  (!) 97.5 F (36.4 C) 98.1 F (36.7 C)   TempSrc:  Oral Oral   SpO2: 100% (!) 64% 98%   Weight:    66.8 kg  Height:        Intake/Output Summary (Last 24 hours) at 06/01/2018 1406 Last data filed at 06/01/2018 0900 Gross per 24 hour  Intake 0 ml  Output 1250 ml  Net -1250 ml   Filed Weights   05/30/18 0353 05/31/18 0427 06/01/18 0559  Weight: 66.4 kg 66.8 kg 66.8 kg    Examination:  General exam: Agitated/debilitated HEENT:PERRL,Oral mucosa moist, Ear/Nose normal on gross exam Respiratory system: Bilateral decreased air entry in the bases Cardiovascular system: S1 & S2 heard, RRR. No JVD, murmurs, rubs, gallops or clicks. No pedal edema. Gastrointestinal system: Abdomen is nondistended, soft and nontender. No organomegaly or masses felt. Normal bowel sounds heard. Central nervous system:Not alert, not oriented. No focal neurological deficits. Extremities: No edema,  no clubbing ,no cyanosis, distal peripheral pulses palpable. Skin: No rashes, lesions or ulcers,no icterus ,no pallor Psychiatry: Judgement and insight appear impaired   Data Reviewed: I have personally reviewed following labs and imaging studies  CBC: Recent Labs  Lab 05/26/18 0512 05/27/18 0434 05/28/18 0518 05/29/18 0548 05/30/18 1302  WBC 7.2 9.3 9.3 9.3 9.4  NEUTROABS 5.1 6.4 6.6 6.5 6.3  HGB 11.1* 11.7* 11.8* 12.1* 11.4*  HCT 33.7* 35.6* 35.5* 36.9* 34.4*  MCV 105.0* 105.3* 106.3* 104.8* 102.4*  PLT 541* 596* 624* 621* 657*   Basic Metabolic Panel: Recent Labs  Lab 05/25/18 2350  05/26/18 0512 05/26/18 0753 05/27/18 0434 05/28/18 0518 05/29/18 0451 05/30/18 0326 05/30/18 1302 05/31/18 0423 06/01/18 0350  NA 141  --   --   --  144 144 138  --  136  --   --   K 2.7*  --   --  3.6 3.3* 3.4* 3.1*  --  4.6  --   --   CL 96*  --   --   --  108 111 115*  --  109  --   --   CO2 33*  --   --   --  26 22 16*  --  20*  --   --   GLUCOSE 166*  --   --   --  116* 122* 91  --  118*  --   --   BUN 16  --   --   --  18 14 10   --  18  --   --   CREATININE 0.60*  --   --   --  0.63 0.52* 0.39*  --  0.56*  --   --   CALCIUM 8.3*  --   --   --  8.5* 8.5* 6.6*  --  8.2*  --   --   MG  --    < > 2.4  --  2.2 2.0 1.5* 2.4  --  1.9 2.0  PHOS  --   --  2.9  --  2.9 3.3 3.1  --   --   --   --    < > = values in this interval not displayed.   GFR: Estimated Creatinine Clearance: 87 mL/min (A) (by C-G formula based on SCr of 0.56 mg/dL (L)). Liver Function Tests: Recent Labs  Lab 05/28/18 1357  PROT 6.1*   No results for input(s): LIPASE, AMYLASE in the last 168 hours. No results for input(s): AMMONIA in the last 168 hours. Coagulation Profile: No results for input(s): INR, PROTIME in the last 168 hours. Cardiac Enzymes: Recent Labs  Lab 05/25/18 1540 05/25/18 2041 05/26/18 0512  TROPONINI <0.03 <0.03 <0.03   BNP (last 3 results) No results for input(s): PROBNP in the  last 8760 hours. HbA1C: No results for input(s): HGBA1C in the last 72 hours. CBG: Recent Labs  Lab 05/31/18 1636 05/31/18 2004 06/01/18 0001 06/01/18 0453 06/01/18 1136  GLUCAP 115* 99 92 97 114*   Lipid Profile: No results for input(s): CHOL, HDL, LDLCALC, TRIG, CHOLHDL, LDLDIRECT in the last 72 hours. Thyroid Function Tests: No results for input(s): TSH, T4TOTAL, FREET4, T3FREE, THYROIDAB in the last 72 hours. Anemia Panel: No results for input(s): VITAMINB12, FOLATE, FERRITIN, TIBC, IRON, RETICCTPCT in the last 72 hours. Sepsis Labs: No results for input(s): PROCALCITON, LATICACIDVEN in the last 168 hours.  Recent Results (from the past 240 hour(s))  Acid Fast Smear (AFB)     Status: None   Collection Time: 05/23/18  9:01 AM  Result Value Ref Range Status   AFB Specimen Processing Concentration  Final   Acid Fast Smear Negative  Final    Comment: (NOTE) Performed At: [facility] [place], [place] [id number] Rush Farmer MD HY:[id number]    Source (AFB) PLEURAL  Final    Comment: RIGHT Performed at [facility], [place] [address] [place], [place] [address]   Culture, body fluid-bottle     Status: None   Collection Time: 05/23/18  9:01 AM  Result Value Ref Range Status   Specimen Description PLEURAL RIGHT  Final   Special Requests NONE  Final   Culture   Final    NO GROWTH 5 DAYS Performed at [facility] [address] [place], [place] [address]    Report Status 05/28/2018 FINAL  Final  Gram stain     Status: None   Collection Time: 05/23/18  9:01 AM  Result Value Ref Range Status   Specimen Description PLEURAL RIGHT  Final   Special Requests NONE  Final   Gram Stain   Final    FEW WBC PRESENT, PREDOMINANTLY MONONUCLEAR NO ORGANISMS SEEN Performed at [facility], [place] [address], [place], [place] [address]    Report Status 05/23/2018 FINAL  Final  Culture, body fluid-bottle     Status: None (Preliminary result)    Collection Time: 05/29/18 11:57 AM  Result Value Ref Range Status   Specimen Description PLEURAL RIGHT  Final  Special Requests NONE  Final   Culture   Final    NO GROWTH 3 DAYS Performed at [facility], [place] [address], [place], [place] [address]    Report Status PENDING  Incomplete  Gram stain     Status: None   Collection Time: 05/29/18 11:57 AM  Result Value Ref Range Status   Specimen Description PLEURAL RIGHT  Final   Special Requests NONE  Final   Gram Stain   Final    RARE WBC PRESENT,BOTH PMN AND MONONUCLEAR NO ORGANISMS SEEN Performed at [facility], [place] [address], [place], [place] [address]    Report Status 05/29/2018 FINAL  Final         Radiology Studies: No results found.      Scheduled Meds: . aspirin  81 mg Per Tube Daily  . chlorhexidine  15 mL Mouth Rinse BID  . Chlorhexidine Gluconate Cloth  6 each Topical Daily  . enoxaparin (LOVENOX) injection  40 mg Subcutaneous Q24H  . feeding supplement (PRO-STAT SUGAR FREE 64)  30 mL Per Tube Daily  . folic acid  1 mg Per Tube Daily  . free water  100 mL Per Tube Q8H  . lidocaine  2 patch Transdermal Q24H  . mouth rinse  15 mL Mouth Rinse q12n4p  . nicotine  14 mg Transdermal Daily  . OLANZapine  2.5 mg Oral QHS  . polyethylene glycol  17 g Per Tube Daily  . sodium chloride flush  10-40 mL Intracatheter Q12H   Continuous Infusions: . sodium chloride 10 mL/hr (05/26/18 0522)  . feeding supplement (OSMOLITE 1.5 CAL) 1,000 mL (06/01/18 1304)     LOS: 18 days    Time spent:35 mins. More than 50% of that time was spent in counseling and/or coordination of care.      Shelly Coss, MD [facility] Pager [phone]  If 7PM-7AM, please contact night-coverage [email or web address] Password [email or web address] 06/01/2018, 2:06 PM

## 2018-06-01 NOTE — Progress Notes (Signed)
  Speech Language Pathology Treatment: Dysphagia  Patient Details Name: [name] Pace MRN: [id number] DOB: Sep 29, 1950 Today's Date: 06/01/2018 Time: 4917-9150 SLP Time Calculation (min) (ACUTE ONLY): 20 min  Assessment / Plan / Recommendation Clinical Impression  Pt was alert, calm and confused today; appeared to be hallucinating, followed commands. Oral care revealed lingual candidia, removed moist mucous. Decreased saliva management and unable to effectively mobilize secretions. Sustained attention to task is challenging for pt. Delayed cough following separate trials ice then cup sips water with suspicion of intermittent airway intrusion. He is making improvements and moving toward his goals but not quite ready for instrumental assessment presently. Recommend frequent oral care, NPO with Cortrak and ST will continue to follow. If continues to improve, he will be ready for instrumental assessment next week.    HPI HPI: Patient is a 67 year old male with history of alcohol abuse, CAD, history of CVA presented to ED with severe right-sided chest wall pain after a fall on the night of 12/7. Pt with R rib fxs 5-11. Intubated 12/13, self extubated 12/18.       SLP Plan  Continue with current plan of care       Recommendations  Diet recommendations: NPO Medication Administration: Via alternative means                Oral Care Recommendations: Oral care QID Follow up Recommendations: 24 hour supervision/assistance SLP Visit Diagnosis: Dysphagia, unspecified (R13.10) Plan: Continue with current plan of care                       [name] Pace 06/01/2018, 12:07 PM   [name] Pace.Ed Risk analyst [phone] Office [phone]

## 2018-06-02 DIAGNOSIS — J942 Hemothorax: Secondary | ICD-10-CM

## 2018-06-02 DIAGNOSIS — Z515 Encounter for palliative care: Secondary | ICD-10-CM

## 2018-06-02 LAB — CBC WITH DIFFERENTIAL/PLATELET
Abs Immature Granulocytes: 0.05 10*3/uL (ref 0.00–0.07)
Basophils Absolute: 0.1 10*3/uL (ref 0.0–0.1)
Basophils Relative: 1 %
Eosinophils Absolute: 0.6 10*3/uL — ABNORMAL HIGH (ref 0.0–0.5)
Eosinophils Relative: 6 %
HCT: 37.6 % — ABNORMAL LOW (ref 39.0–52.0)
Hemoglobin: 12.7 g/dL — ABNORMAL LOW (ref 13.0–17.0)
Immature Granulocytes: 1 %
Lymphocytes Relative: 16 %
Lymphs Abs: 1.5 10*3/uL (ref 0.7–4.0)
MCH: 34.9 pg — AB (ref 26.0–34.0)
MCHC: 33.8 g/dL (ref 30.0–36.0)
MCV: 103.3 fL — ABNORMAL HIGH (ref 80.0–100.0)
MONO ABS: 1.1 10*3/uL — AB (ref 0.1–1.0)
Monocytes Relative: 12 %
Neutro Abs: 6.1 10*3/uL (ref 1.7–7.7)
Neutrophils Relative %: 64 %
Platelets: 684 10*3/uL — ABNORMAL HIGH (ref 150–400)
RBC: 3.64 MIL/uL — AB (ref 4.22–5.81)
RDW: 12.5 % (ref 11.5–15.5)
WBC: 9.5 10*3/uL (ref 4.0–10.5)
nRBC: 0 % (ref 0.0–0.2)

## 2018-06-02 LAB — BASIC METABOLIC PANEL
Anion gap: 10 (ref 5–15)
BUN: 18 mg/dL (ref 8–23)
CO2: 22 mmol/L (ref 22–32)
Calcium: 8.7 mg/dL — ABNORMAL LOW (ref 8.9–10.3)
Chloride: 106 mmol/L (ref 98–111)
Creatinine, Ser: 0.63 mg/dL (ref 0.61–1.24)
GFR calc Af Amer: >60 mL/min (ref >60)
GFR calc non Af Amer: >60 mL/min (ref >60)
Glucose, Bld: 106 mg/dL — ABNORMAL HIGH (ref 70–99)
Potassium: 3.7 mmol/L (ref 3.5–5.1)
SODIUM: 138 mmol/L (ref 135–145)

## 2018-06-02 LAB — GLUCOSE, CAPILLARY
Glucose-Capillary: 106 mg/dL — ABNORMAL HIGH (ref 70–99)
Glucose-Capillary: 96 mg/dL (ref 70–99)
Glucose-Capillary: 126 mg/dL — ABNORMAL HIGH (ref 70–99)
Glucose-Capillary: 81 mg/dL (ref 70–99)
Glucose-Capillary: 96 mg/dL (ref 70–99)

## 2018-06-02 NOTE — Progress Notes (Signed)
Pt pulled out ng tube

## 2018-06-02 NOTE — Progress Notes (Signed)
[facility]  PROGRESS NOTE  [name] Pace:[id number] DOB: August 09, 1950 DOA: 05/13/2018 PCP: System, Provider Not In   Brief HPI:   67 year old male with a history of CVA, prostate cancer, alcohol abuse, substance abuse, hyperlipidemia was admitted on 05/13/2018 after a fall with right-sided chest pain.  Work-up revealed multiple right-sided rib fractures and small pneumothorax which did not require chest tube.  Patient developed acute respiratory failure and also alcohol withdrawal was intubated on 05/18/2018 started on Precedex drip, Levophed.  Patient underwent right-sided thoracentesis with removal of 450 mL of clear fluid on 05/23/2018.  Patient self extubated on 05/23/18.  Since that time patient continues to be agitated, confused.  Started on NG tube feeding due to persistent inability to take by mouth. TRH resumed care on 05/30/2018. Palliative care consulted.    Subjective   Patient seen and examined, continues to be confused.   Assessment/Plan:     1. Delirium-multifactorial, alcohol/opiate withdrawal, ICU delirium.  He also has history of CVA.  Psychiatry was consulted on 05/31/2018, started on Seroquel which has been discontinued.  Started on Zyprexa.  CT head on 05/27/2018 was negative for acute intracranial abnormality.  Continue PRN Haldol for agitation, use only if Zyprexa does not help.  2. Chronic alcoholism/substance abuse-continue thiamine, folic acid.  CIWA protocol.  3. Dysphagia/inability to swallow-secondary to ongoing agitation/disorientation.  Started on tube feeding via NG tube feeding tube.  Speech therapy following.  4. Mechanical fall/rib fractures-multiple rib fractures noticed on admission.  Continue pain management.  5. Acute hypoxic respiratory failure-resolved,In setting of multiple rib fractures/atelectasis/pneumothorax.  Initially required intubation, self extubated on 1218.  Did not require chest tube.  Continue supplemental oxygen as needed.   Completed course of Levaquin.  6. Right layering effusion/hemothorax-history of multiple rib fractures, status post right-sided thoracentesis on 05/27/2018 with removal of 180 cc of bloody tinged fluid.  7. Goals of care-palliative care has been consulted for goals of care for persistent delirium.     CBG: Recent Labs  Lab 06/01/18 1958 06/01/18 2344 06/02/18 0354 06/02/18 0734 06/02/18 1117  GLUCAP 115* 122* 106* 96 126*    CBC: Recent Labs  Lab 05/27/18 0434 05/28/18 0518 05/29/18 0548 05/30/18 1302 06/02/18 0417  WBC 9.3 9.3 9.3 9.4 9.5  NEUTROABS 6.4 6.6 6.5 6.3 6.1  HGB 11.7* 11.8* 12.1* 11.4* 12.7*  HCT 35.6* 35.5* 36.9* 34.4* 37.6*  MCV 105.3* 106.3* 104.8* 102.4* 103.3*  PLT 596* 624* 621* 608* 684*    Basic Metabolic Panel: Recent Labs  Lab 05/27/18 0434 05/28/18 0518 05/29/18 0451 05/30/18 0326 05/30/18 1302 05/31/18 0423 06/01/18 0350 06/02/18 0417  NA 144 144 138  --  136  --   --  138  K 3.3* 3.4* 3.1*  --  4.6  --   --  3.7  CL 108 111 115*  --  109  --   --  106  CO2 26 22 16*  --  20*  --   --  22  GLUCOSE 116* 122* 91  --  118*  --   --  106*  BUN 18 14 10   --  18  --   --  18  CREATININE 0.63 0.52* 0.39*  --  0.56*  --   --  0.63  CALCIUM 8.5* 8.5* 6.6*  --  8.2*  --   --  8.7*  MG 2.2 2.0 1.5* 2.4  --  1.9 2.0  --   PHOS 2.9 3.3 3.1  --   --   --   --   --  DVT prophylaxis: Lovenox  Code Status: Full code  Family Communication: No family at bedside  Disposition Plan: Pending based on palliative care evaluation.   Consultants:  PCCM  Procedures:  Intubation mechanical ventilation   Antibiotics:   Anti-infectives (From admission, onward)   Start     Dose/Rate Route Frequency Ordered Stop   05/24/18 1230  levofloxacin (LEVAQUIN) tablet 500 mg     500 mg Oral Daily 05/24/18 1100 05/26/18 0959   05/18/18 2000  levofloxacin (LEVAQUIN) IVPB 750 mg     750 mg 100 mL/hr over 90 Minutes Intravenous Every 24 hours  05/18/18 0440 05/22/18 2240   05/18/18 0600  metroNIDAZOLE (FLAGYL) IVPB 500 mg  Status:  Discontinued     500 mg 100 mL/hr over 60 Minutes Intravenous Every 8 hours 05/18/18 0459 05/18/18 1047   05/17/18 2030  levofloxacin (LEVAQUIN) IVPB 500 mg  Status:  Discontinued     500 mg 100 mL/hr over 60 Minutes Intravenous Every 24 hours 05/17/18 1957 05/18/18 0440       Objective   Vitals:   06/01/18 0456 06/01/18 0559 06/01/18 2000 06/02/18 0500  BP: 113/65  99/73   Pulse:   77   Resp: 20 (!) 24 18   Temp: 98.1 F (36.7 C)  98.5 F (36.9 C)   TempSrc: Oral  Oral   SpO2: 98%  98%   Weight:  66.8 kg  66.6 kg  Height:        Intake/Output Summary (Last 24 hours) at 06/02/2018 1400 Last data filed at 06/02/2018 3710 Gross per 24 hour  Intake 0 ml  Output 1125 ml  Net -1125 ml   Filed Weights   05/31/18 0427 06/01/18 0559 06/02/18 0500  Weight: 66.8 kg 66.8 kg 66.6 kg     Physical Examination:    General: Confused  Cardiovascular: S1-S2, regular, no murmur auscultated  Respiratory: Clear to auscultation bilaterally  Abdomen: Soft, nontender, no organomegaly  Extremities: Moving all extremities  Neurologic: Alert to self only, not oriented to place and time     Data Reviewed: I have personally reviewed following labs and imaging studies   Recent Results (from the past 240 hour(s))  Culture, body fluid-bottle     Status: None (Preliminary result)   Collection Time: 05/29/18 11:57 AM  Result Value Ref Range Status   Specimen Description PLEURAL RIGHT  Final   Special Requests NONE  Final   Culture   Final    NO GROWTH 4 DAYS Performed at [facility], [address] [place], [place] [address]    Report Status PENDING  Incomplete  Gram stain     Status: None   Collection Time: 05/29/18 11:57 AM  Result Value Ref Range Status   Specimen Description PLEURAL RIGHT  Final   Special Requests NONE  Final   Gram Stain   Final    RARE WBC PRESENT,BOTH  PMN AND MONONUCLEAR NO ORGANISMS SEEN Performed at [facility], [place] [address] [place], [place] [address]    Report Status 05/29/2018 FINAL  Final     Liver Function Tests: Recent Labs  Lab 05/28/18 1357  PROT 6.1*   No results for input(s): LIPASE, AMYLASE in the last 168 hours. No results for input(s): AMMONIA in the last 168 hours.  Cardiac Enzymes: No results for input(s): CKTOTAL, CKMB, CKMBINDEX, TROPONINI in the last 168 hours. BNP (last 3 results) No results for input(s): BNP in the last 8760 hours.  ProBNP (last  3 results) No results for input(s): PROBNP in the last 8760 hours.    Studies: No results found.  Scheduled Meds: . aspirin  81 mg Per Tube Daily  . chlorhexidine  15 mL Mouth Rinse BID  . Chlorhexidine Gluconate Cloth  6 each Topical Daily  . enoxaparin (LOVENOX) injection  40 mg Subcutaneous Q24H  . feeding supplement (PRO-STAT SUGAR FREE 64)  30 mL Per Tube Daily  . folic acid  1 mg Per Tube Daily  . free water  100 mL Per Tube Q8H  . lidocaine  2 patch Transdermal Q24H  . mouth rinse  15 mL Mouth Rinse q12n4p  . nicotine  14 mg Transdermal Daily  . OLANZapine  2.5 mg Oral QHS  . polyethylene glycol  17 g Per Tube Daily  . sodium chloride flush  10-40 mL Intracatheter Q12H    Admission status: Inpatient: Based on patients clinical presentation and evaluation of above clinical data, I have made determination that patient meets Inpatient criteria at this time.  Patient continues to be in delirium.  Will require skilled nursing facility once stable.  Time spent: 20 min  [facility] Pager [phone]. If 7PM-7AM, please contact night-coverage at [email or web address], Office  [phone]  password [email or web address]  06/02/2018, 2:00 PM  LOS: 19 days

## 2018-06-02 NOTE — Consult Note (Signed)
[place]  [phone] [phone]   Name: [name] Pace Date: 06/02/2018 MRN: [id number]  DOB: 1951/04/04  Patient Care Team: System, Provider Not In as PCP - General    REASON FOR CONSULTATION: Palliative Care consult requested for this 67 y.o. male with multiple medical problems including alcohol abuse, h/o seizures, CAD, history of CVA, who was admitted to the hospital on 05/14/2018 with right chest wall pain following a fall at home.  Patient was found to have multiple right rib fractures and a right-sided pneumothorax.  Unfortunately patient later developed acute respiratory failure and alcohol withdrawal requiring intubation on 12/13.  He self extubated on 12/18.  Hospitalization has been further complicated by persistent delirium, agitation, and dysphasia.  Patient has required use of a sitter.  Palliative care was consulted to help address goals.  SOCIAL HISTORY:    Patient was living in an apartment with a girlfriend. Patient formerly worked as a Dealer. However, daughter says that patient was mostly supported by his parents and then uncle. He has been disabled over 40 years.   ADVANCE DIRECTIVES:  Not on file  CODE STATUS: Full code  PAST MEDICAL HISTORY: Past Medical History:  Diagnosis Date  . Alcohol abuse   . Anxiety   . Breast cancer ([place]) Left breast   Per patient diagnosed in early 44's.   . Coronary artery disease   . High cholesterol   . Prostate cancer ([place])    per patient diagnosed in early 32's  . Stroke [facility])     PAST SURGICAL HISTORY:  Past Surgical History:  Procedure Laterality Date  . CAROTID STENT    . CORONARY ARTERY BYPASS GRAFT    . FEMUR IM NAIL Left 07/02/2014   Procedure: INTRAMEDULLARY (IM) NAIL FEMORAL;  Surgeon: Elie Goody, MD;  Location: [facility];  Service: Orthopedics;  Laterality: Left;  . I&D EXTREMITY Right 02/10/2016   Procedure: IRRIGATION AND  DEBRIDEMENT EXTREMITY;  Surgeon: Dorna Leitz, MD;  Location: [place];  Service: Orthopedics;  Laterality: Right;  . TENDON REPAIR Right 02/10/2016   Procedure: Anterior TENDON REPAIR and wound exploration.;  Surgeon: Dorna Leitz, MD;  Location: [place];  Service: Orthopedics;  Laterality: Right;    HEMATOLOGY/ONCOLOGY HISTORY:   No history exists.    ALLERGIES:  is allergic to penicillins.  MEDICATIONS:  Current Facility-Administered Medications  Medication Dose Route Frequency Provider Last Rate Last Dose  . 0.9 %  sodium chloride infusion   Intravenous PRN Brand Males, MD 10 mL/hr at 05/26/18 0522 10 mL/hr at 05/26/18 0522  . aspirin chewable tablet 81 mg  81 mg Per Tube Daily Skeet Simmer, RPH   81 mg at 06/01/18 6213  . chlorhexidine (PERIDEX) 0.12 % solution 15 mL  15 mL Mouth Rinse BID Shelly Coss, MD   15 mL at 06/01/18 2219  . Chlorhexidine Gluconate Cloth 2 % PADS 6 each  6 each Topical Daily Rush Farmer, MD   6 each at 06/01/18 2220  . enoxaparin (LOVENOX) injection 40 mg  40 mg Subcutaneous Q24H Corey Harold, NP   40 mg at 06/01/18 1238  . feeding supplement (OSMOLITE 1.5 CAL) liquid 1,000 mL  1,000 mL Per Tube Continuous Brand Males, MD 60 mL/hr at 06/02/18 0655 1,000 mL at 06/02/18 0655  . feeding supplement (PRO-STAT SUGAR FREE 64) liquid 30 mL  30 mL Per Tube Daily Brand Males, MD   30 mL at 06/01/18  3825  . fentaNYL (SUBLIMAZE) injection 25-100 mcg  25-100 mcg Intravenous Q2H PRN Brand Males, MD   50 mcg at 05/29/18 2257  . folic acid (FOLVITE) tablet 1 mg  1 mg Per Tube Daily Brand Males, MD   1 mg at 06/01/18 0927  . free water 100 mL  100 mL Per Tube Q8H Corey Harold, NP   100 mL at 06/02/18 0539  . haloperidol lactate (HALDOL) injection 2 mg  2 mg Intravenous Q6H PRN Corey Harold, NP   2 mg at 05/31/18 0152  . ibuprofen (ADVIL,MOTRIN) 100 MG/5ML suspension 400 mg  400 mg Oral Q6H PRN Adhikari, Amrit, MD      .  ipratropium-albuterol (DUONEB) 0.5-2.5 (3) MG/3ML nebulizer solution 3 mL  3 mL Nebulization Q6H PRN Rai, Ripudeep K, MD   3 mL at 05/31/18 0437  . lidocaine (LIDODERM) 5 % 2 patch  2 patch Transdermal Q24H Kipp Brood, MD   2 patch at 06/01/18 1234  . MEDLINE mouth rinse  15 mL Mouth Rinse q12n4p Adhikari, Amrit, MD   15 mL at 06/01/18 1600  . nicotine (NICODERM CQ - dosed in mg/24 hours) patch 14 mg  14 mg Transdermal Daily Brand Males, MD   14 mg at 06/01/18 0925  . OLANZapine (ZYPREXA) tablet 2.5 mg  2.5 mg Oral QHS Shelly Coss, MD   2.5 mg at 06/01/18 2220  . OLANZapine (ZYPREXA) tablet 2.5 mg  2.5 mg Oral Daily PRN Shelly Coss, MD   2.5 mg at 06/01/18 0111  . ondansetron (ZOFRAN) tablet 4 mg  4 mg Oral Q6H PRN Rai, Ripudeep K, MD       Or  . ondansetron (ZOFRAN) injection 4 mg  4 mg Intravenous Q6H PRN Rai, Ripudeep K, MD   4 mg at 05/24/18 1042  . polyethylene glycol (MIRALAX / GLYCOLAX) packet 17 g  17 g Per Tube Daily Brand Males, MD   17 g at 06/01/18 0927  . sodium chloride flush (NS) 0.9 % injection 10-40 mL  10-40 mL Intracatheter PRN Rai, Ripudeep K, MD   10 mL at 05/18/18 2200  . sodium chloride flush (NS) 0.9 % injection 10-40 mL  10-40 mL Intracatheter PRN Rush Farmer, MD      . sodium chloride flush (NS) 0.9 % injection 10-40 mL  10-40 mL Intracatheter Q12H Rush Farmer, MD   10 mL at 06/01/18 2220    VITAL SIGNS: BP 99/73 (BP Location: Right Arm)   Pulse 77   Temp 98.5 F (36.9 C) (Oral)   Resp 18   Ht 6\' 2"  (1.88 m)   Wt 66.6 kg   SpO2 98%   BMI 18.85 kg/m  Filed Weights   05/31/18 0427 06/01/18 0559 06/02/18 0500  Weight: 66.8 kg 66.8 kg 66.6 kg    Estimated body mass index is 18.85 kg/m as calculated from the following:   Height as of this encounter: 6\' 2"  (1.88 m).   Weight as of this encounter: 66.6 kg.  LABS: CBC:    Component Value Date/Time   WBC 9.5 06/02/2018 0417   HGB 12.7 (L) 06/02/2018 0417   HCT 37.6 (L)  06/02/2018 0417   PLT 684 (H) 06/02/2018 0417   MCV 103.3 (H) 06/02/2018 0417   NEUTROABS 6.1 06/02/2018 0417   LYMPHSABS 1.5 06/02/2018 0417   MONOABS 1.1 (H) 06/02/2018 0417   EOSABS 0.6 (H) 06/02/2018 0417   BASOSABS 0.1 06/02/2018 0417   Comprehensive Metabolic Panel:  Component Value Date/Time   NA 138 06/02/2018 0417   K 3.7 06/02/2018 0417   CL 106 06/02/2018 0417   CO2 22 06/02/2018 0417   BUN 18 06/02/2018 0417   CREATININE 0.63 06/02/2018 0417   GLUCOSE 106 (H) 06/02/2018 0417   CALCIUM 8.7 (L) 06/02/2018 0417   AST 36 05/22/2018 0502   ALT 15 05/22/2018 0502   ALKPHOS 45 05/22/2018 0502   BILITOT 1.0 05/22/2018 0502   PROT 6.1 (L) 05/28/2018 1357   ALBUMIN 1.6 (L) 05/23/2018 0340    RADIOGRAPHIC STUDIES: Dg Chest 1 View  Result Date: 05/23/2018 CLINICAL DATA:  Status post right-sided thoracentesis with removal of 450 cc of fluid. EXAM: CHEST  1 VIEW COMPARISON:  Portable chest x-ray of May 22, 2018 FINDINGS: The volume of pleural fluid on the right has decreased somewhat. There is no postprocedure pneumothorax. There is no mediastinal shift. The left lung exhibits medial basal density compatible with atelectasis. The heart and pulmonary vascularity are normal. There are post CABG changes. The endotracheal tube tip projects approximately 3.4 cm above the carina. The esophagogastric tube tip projects below the inferior margin of the image. The right internal jugular venous catheter tip projects over the midportion of the SVC. IMPRESSION: No postprocedure complication following right-sided thoracentesis. Persistent bibasilar atelectasis. Small right pleural effusion. The support tubes are in reasonable position. Electronically Signed   By: David  Martinique M.D.   On: 05/23/2018 09:31   Dg Abd 1 View  Result Date: 05/27/2018 CLINICAL DATA:  Nasogastric tube repositioning. EXAM: ABDOMEN - 1 VIEW COMPARISON:  Abdominal radiograph performed earlier today at 6:39 p.m.  FINDINGS: The patient's enteric tube is noted coiled at the antrum and body of the stomach. The visualized bowel gas pattern is grossly unremarkable. No acute osseous abnormalities are seen. The patient is status post median sternotomy. A small right pleural effusion is again noted, with overlying displaced rib fractures. IMPRESSION: Enteric tube noted coiled at the antrum and body of the stomach. Electronically Signed   By: Garald Balding M.D.   On: 05/27/2018 22:18   Dg Abd 1 View  Result Date: 05/27/2018 CLINICAL DATA:  NG tube placement EXAM: ABDOMEN - 1 VIEW COMPARISON:  None. FINDINGS: Esophageal tube is folded back upon itself over the stomach. There may be a kink in the distal tube. Elevated right diaphragm with pleural and parenchymal disease. Nonobstructed bowel-gas pattern. IMPRESSION: Esophageal tube is folded back upon itself distally with possible kink in the distal tube. The tip is positioned over the expected location of gastric body Electronically Signed   By: Donavan Foil M.D.   On: 05/27/2018 19:52   Ct Head Wo Contrast  Result Date: 05/30/2018 CLINICAL DATA:  Altered mental status. History of breast cancer, stroke and alcohol abuse. EXAM: CT HEAD WITHOUT CONTRAST TECHNIQUE: Contiguous axial images were obtained from the base of the skull through the vertex without intravenous contrast. COMPARISON:  CT HEAD July 12, 2005 FINDINGS: BRAIN: No intraparenchymal hemorrhage, mass effect nor midline shift. Moderate parenchymal brain volume loss. Severe vermian atrophy. No hydrocephalus. Old cystic RIGHT basal ganglia infarct with mild ex vacuo dilatation subjacent ventricle. Patchy to confluent supratentorial white matter hypodensities. No acute large vascular territory infarcts. No abnormal extra-axial fluid collections. Basal cisterns are patent. VASCULAR: Moderate calcific atherosclerosis of the carotid siphons. SKULL: No skull fracture. No significant scalp soft tissue swelling.  SINUSES/ORBITS: Mild lobulated mucosal thickening. No paranasal sinus air-fluid levels. Mastoid air cells are well aerated.The included ocular globes  and orbital contents are non-suspicious. OTHER: None. IMPRESSION: 1. No acute intracranial process. 2. Old RIGHT basal ganglia infarct. Moderate chronic small vessel ischemic changes. 3. Mild parenchymal brain volume loss, severe vermian atrophy. Electronically Signed   By: Elon Alas M.D.   On: 05/30/2018 00:40   Ct Chest Wo Contrast  Result Date: 05/19/2018 CLINICAL DATA:  Pneumothorax. EXAM: CT CHEST WITHOUT CONTRAST TECHNIQUE: Multidetector CT imaging of the chest was performed following the standard protocol without IV contrast. COMPARISON:  Radiographs of same day.  CT scan of May 13, 2018. FINDINGS: Cardiovascular: Atherosclerosis of thoracic aorta is noted. Coronary artery calcifications are noted. Normal cardiac size. No pericardial effusion. 2.4 x 1.9 cm rounded abnormality is seen to the left of ascending thoracic aorta concerning for aneurysm. Mediastinum/Nodes: Endotracheal and nasogastric tubes are in grossly good position. No significant adenopathy is noted. Thyroid gland is unremarkable. Lungs/Pleura: No definite pneumothorax is noted. Mild bilateral pleural effusions are noted with adjacent atelectasis or infiltrates of the lower lobes and right upper lobe. Upper Abdomen: No acute abnormality. Musculoskeletal: Multiple moderately displaced fractures are seen involving the right ribs. Subcutaneous emphysema is seen overlying the right chest wall. IMPRESSION: No definite pneumothorax is noted. Moderate bilateral pleural effusions are noted with adjacent atelectasis or infiltrate seen in both lower lobes and right upper lobe, right greater than left. Multiple moderately displaced right rib fractures are noted with overlying subcutaneous emphysema of right chest wall. Endotracheal and nasogastric tubes are unchanged in position. Coronary  artery calcifications are noted. 2.4 cm rounded abnormality is seen to the left of ascending thoracic aorta which is concerning for possible aneurysm. CTA of the chest is recommended for further evaluation. Aortic Atherosclerosis (ICD10-I70.0). Electronically Signed   By: Marijo Conception, M.D.   On: 05/19/2018 12:56   Ct Chest Wo Contrast  Result Date: 05/14/2018 CLINICAL DATA:  67 year old male with fall and trauma to the right chest wall. EXAM: CT CHEST WITHOUT CONTRAST TECHNIQUE: Multidetector CT imaging of the chest was performed following the standard protocol without IV contrast. COMPARISON:  Chest radiograph dated 07/02/2014 FINDINGS: Evaluation of this exam is limited in the absence of intravenous contrast. Cardiovascular: Top-normal cardiac size. No pericardial effusion. Multi vessel coronary vascular calcification and postsurgical changes of CABG. A 2.3 x 2.1 cm rounded structure in the anterior mediastinum anterior to the pulmonary trunk (series 3, image 70) is not characterized on this noncontrast CT but most likely represents a saccular aneurysm. This can be further evaluated with contrast enhanced CT. There is mild atherosclerotic calcification of the thoracic aorta. The central pulmonary arteries are grossly unremarkable. A left subclavian stent is not well evaluated on this noncontrast CT. Mediastinum/Nodes: No hilar or mediastinal adenopathy. Small hiatal hernia. No mediastinal fluid collection. Lungs/Pleura: There is a background of emphysema. Minimal bibasilar atelectatic changes. There is a trace right pleural effusion. Minimal pneumothorax in the right apex measures 3 mm in thickness. No focal consolidation. Small amount of mucus material noted in the central airway. The airways are otherwise patent. Upper Abdomen: Cholecystectomy. The visualized upper abdomen is otherwise unremarkable. Musculoskeletal: Multiple mildly displaced right rib fractures involving the right 5th-11th ribs. There is  a small amount of chest wall emphysema. Right scapular fixation screws. IMPRESSION: 1. Multiple mildly displaced right rib fractures involving the right 5th-11th ribs. 2. Minimal right apical pneumothorax. 3. Rounded structure in the anterior mediastinum, likely a saccular aneurysm. This can be further evaluated with contrast enhanced CT on a nonemergent basis. These results  were called by telephone at the time of interpretation on 05/14/2018 at 12:18 am to Dr. Addison Lank , who verbally acknowledged these results. Electronically Signed   By: Anner Crete M.D.   On: 05/14/2018 00:20   Dg Chest Port 1 View  Result Date: 05/29/2018 CLINICAL DATA:  67 year old male status post ultrasound-guided right side thoracentesis this morning. Status post fall with right chest wall trauma earlier this month. EXAM: PORTABLE CHEST 1 VIEW COMPARISON:  Portable chest 05/28/2018 and earlier. FINDINGS: Portable AP semi upright view at 1219 hours. Decreased veiling opacity in the right lung compared to yesterday. No pneumothorax. Stable cardiac size and mediastinal contours. Stable right PICC line and visible enteric feeding tube. Prior CABG. Left common carotid artery region vascular stent. Stable and negative left lung ventilation. Numerous displaced right lateral rib fractures again noted. IMPRESSION: Decreased right pleural effusion and no pneumothorax following thoracentesis. Electronically Signed   By: Genevie Ann M.D.   On: 05/29/2018 12:34   Dg Chest Port 1 View  Result Date: 05/28/2018 CLINICAL DATA:  Respiratory failure EXAM: PORTABLE CHEST 1 VIEW COMPARISON:  May 27, 2018 FINDINGS: Feeding tube tip is in the stomach. Central catheter tip is in the superior vena cava. No pneumothorax a. There is a moderate pleural effusion on the right with consolidation throughout the right mid and lower lung zones. There is mild upper lobe atelectatic change bilaterally. No consolidation on the left. There is cardiomegaly  with pulmonary venous hypertension. Patient is status post coronary artery bypass grafting. A stent is noted in the left subclavian region medially, stable. IMPRESSION: Tube and catheter positions as described without pneumothorax. Right pleural effusion with consolidation in the right mid and lower lung zones. Some of this consolidation is due to atelectasis. Pneumonia on the right is suspected, however. There is mild upper lobe atelectasis bilaterally. There is pulmonary vascular congestion. Postoperative changes are noted. Electronically Signed   By: Lowella Grip III M.D.   On: 05/28/2018 07:10   Dg Chest Port 1 View  Result Date: 05/27/2018 CLINICAL DATA:  Increased shortness of breath, history breast cancer, coronary artery disease, prostate cancer EXAM: PORTABLE CHEST 1 VIEW COMPARISON:  Portable exam 1316 hours compared to 05/26/2018 FINDINGS: RIGHT arm PICC line tip projects over SVC. Feeding tube extends into stomach. Borderline enlargement of cardiac silhouette post CABG. Mediastinal contours and pulmonary vascularity normal. Persistent RIGHT pleural effusion and basilar atelectasis versus consolidation. LEFT lung clear. No pneumothorax. Bones demineralized. IMPRESSION: Persistent RIGHT pleural effusion and basilar atelectasis versus consolidation. Borderline enlargement of cardiac silhouette post CABG. Electronically Signed   By: Lavonia Dana M.D.   On: 05/27/2018 13:41   Dg Chest Port 1 View  Result Date: 05/26/2018 CLINICAL DATA:  Acute onset of respiratory distress. Agitation. EXAM: PORTABLE CHEST 1 VIEW COMPARISON:  Chest radiograph performed 05/23/2018 FINDINGS: A small right pleural effusion is noted. Hazy right-sided and left basilar airspace opacification raises concern for pneumonia, though asymmetric pulmonary edema might have a similar appearance. No pneumothorax is seen. The cardiomediastinal silhouette is mildly enlarged. The patient is status post median sternotomy, with  evidence of prior CABG. An enteric tube is noted extending overlying the body of the stomach. A right PICC is noted ending about the mid SVC. Postoperative change is noted at the right glenoid, with chronic loss of the right glenohumeral joint space. No acute osseous abnormalities are identified. IMPRESSION: 1. Small right pleural effusion noted. Hazy right-sided and left basilar airspace opacification raises concern for pneumonia,  though asymmetric pulmonary edema might have a similar appearance. 2. Mild cardiomegaly. Electronically Signed   By: Garald Balding M.D.   On: 05/26/2018 23:20   Dg Chest Port 1 View  Result Date: 05/23/2018 CLINICAL DATA:  Recent fall with chest pain and cough, initial encounter EXAM: PORTABLE CHEST 1 VIEW COMPARISON:  05/23/18 FINDINGS: Endotracheal tube and nasogastric catheter have been removed in the interval. Right jugular central line remains in place. Postsurgical changes are seen. Cardiac shadow is stable. Left subclavian arterial stent is seen. Multiple right-sided rib fractures are noted with slight increase in the degree of right-sided pleural effusion and underlying atelectatic changes. No pneumothorax is seen. IMPRESSION: Increasing right-sided effusion and atelectasis. Multiple right rib fractures. Electronically Signed   By: Inez Catalina M.D.   On: 05/23/2018 12:24   Dg Chest Port 1 View  Result Date: 05/22/2018 CLINICAL DATA:  Respiratory failure EXAM: PORTABLE CHEST 1 VIEW COMPARISON:  05/21/2018 FINDINGS: Support devices are stable. Cardiomegaly. Bilateral airspace disease and layering effusions, right greater than left. This could reflect asymmetric edema or infection. No significant change since prior study. IMPRESSION: Bilateral airspace disease and layering effusions, right greater than left. This could reflect asymmetric edema or infection. No change. Electronically Signed   By: Rolm Baptise M.D.   On: 05/22/2018 08:42   Dg Chest Port 1 View  Result  Date: 05/21/2018 CLINICAL DATA:  Ventilator support EXAM: PORTABLE CHEST 1 VIEW COMPARISON:  05/19/2018 FINDINGS: Endotracheal tube tip is 3 cm above the carina. Nasogastric tube enters the abdomen. Right internal jugular central line tip is in the SVC 2 cm above the right atrium. Bilateral lower lobe volume loss persists with pleural fluid on the right. Findings are slightly worsened since 2 days ago. IMPRESSION: Worsening lower lobe volume loss and effusions right larger than left. Electronically Signed   By: Nelson Chimes M.D.   On: 05/21/2018 07:26   Dg Chest Port 1 View  Result Date: 05/19/2018 CLINICAL DATA:  Hemothorax EXAM: PORTABLE CHEST 1 VIEW COMPARISON:  05/19/2018 FINDINGS: Support devices are stable. Multiple right lateral rib fractures are again noted. Bilateral pleural effusions, right greater than left. Bibasilar atelectasis or infiltrates. No visible pneumothorax. IMPRESSION: No significant change since prior study. Electronically Signed   By: Rolm Baptise M.D.   On: 05/19/2018 16:09   Dg Chest Port 1 View  Result Date: 05/19/2018 CLINICAL DATA:  History of ETT EXAM: PORTABLE CHEST 1 VIEW COMPARISON:  May 18, 2018 FINDINGS: The ETT terminates in good position. The right central line is stable and unremarkable. The enteric tube terminates below today's film. No pneumothorax. Numerous right-sided rib fractures again noted. Right-sided pleural effusion with underlying opacity is similar in the interval. Possible small layering effusion on the left, less prominent. The cardiomediastinal silhouette is stable. No other acute abnormalities. IMPRESSION: 1. Support apparatus as above. 2. Numerous right-sided rib fractures, stable.  No pneumothorax. 3. Right greater than left pleural effusions with underlying opacities, stable on the right and improved on the left. Electronically Signed   By: Dorise Bullion III M.D   On: 05/19/2018 10:04   Dg Chest Port 1 View  Result Date:  05/18/2018 CLINICAL DATA:  Encounter for central line placement. EXAM: PORTABLE CHEST 1 VIEW COMPARISON:  05/18/2018 FINDINGS: There are postoperative changes from median sternotomy and CABG procedure. Right IJ catheter tip projects over the distal SVC. There is an ET tube with tip above the carina. Enteric tube tip is below the level of the  GE junction. Normal heart size. Persistent bilateral pleural effusions are identified. Mild interstitial edema noted. There is a air fluid level identified overlying the right mid and lower lung which may reflect a component of loculated pneumothorax. Multiple right lateral rib fracture deformities are identified. Similar to previous exam. Subcutaneous gas within the right lateral chest wall noted. IMPRESSION: 1. Persistent bilateral pleural effusions. There is a fluid level projecting over the right mid and lower lung which is indeterminate but could represent a component of loculated hydropneumothorax. This could be better assessed with CT of the chest. 2. Right rib fractures with overlying subcutaneous emphysema. Electronically Signed   By: Kerby Moors M.D.   On: 05/18/2018 10:46   Dg Chest Port 1 View  Result Date: 05/18/2018 CLINICAL DATA:  ET tube adjustment EXAM: PORTABLE CHEST 1 VIEW COMPARISON:  May 18, 2018 FINDINGS: An ET tube terminates in the mid trachea, in good position. An enteric tube terminates below today's film. No pneumothorax. Air in the right chest wall remains, stable. Stable cardiomegaly. The hila and mediastinum are unchanged. Layering effusion on the right with underlying opacity, similar. Mild patchy opacity in left base, slightly more prominent. No other interval changes. IMPRESSION: 1. Support apparatus as above. 2. Stable effusion and underlying opacity in the right base. 3. Mild opacity in the left base is slightly more prominent in the interval. Recommend attention on follow-up. Electronically Signed   By: Dorise Bullion III M.D    On: 05/18/2018 09:05   Dg Chest Port 1 View  Result Date: 05/18/2018 CLINICAL DATA:  Respiratory difficulty EXAM: PORTABLE CHEST 1 VIEW COMPARISON:  05/18/2018 418 hours FINDINGS: NG tube has been placed. The tip is in the antrum of the stomach. Endotracheal tube is not clearly visualized. Normal heart size. Low lung volumes. Bibasilar opacity likely combination of atelectasis and airspace disease is stable. No evidence of pneumothorax. Right-sided rib fractures are again noted. IMPRESSION: NG tube placed into the stomach. Endotracheal tube is not visualized. Stable bibasilar pulmonary opacities as described No sign of pneumothorax. Multiple right-sided rib fractures are unchanged. Electronically Signed   By: Marybelle Killings M.D.   On: 05/18/2018 08:24   Dg Chest Port 1 View  Result Date: 05/18/2018 CLINICAL DATA:  67 y/o  M; increased shortness of breath. EXAM: PORTABLE CHEST 1 VIEW COMPARISON:  05/17/2018 chest radiograph. FINDINGS: Multiple displaced right lateral rib fractures are stable. Subcutaneous emphysema within the right chest wall again noted. Linear structure at the right lung base is stable possibly representing subpulmonic pneumothorax or atelectasis as described on prior study. Stable bibasilar atelectasis. Stable right effusion. Stable cardiac silhouette given projection and technique. Post median sternotomy and CABG with wires in alignment. Stable break in the sternotomy wire second from bottom. Postsurgical changes of the right shoulder joint. IMPRESSION: 1. Stable linear structure at the right lung base possibly representing subpulmonic pneumothorax or atelectasis as described on prior study. 2. Stable displaced right lateral rib fractures and subcutaneous emphysema of the right chest wall. 3. Stable bibasilar atelectasis and right-sided effusion. Electronically Signed   By: Kristine Garbe M.D.   On: 05/18/2018 05:35   Dg Chest Port 1 View  Result Date: 05/17/2018 CLINICAL  DATA:  Severe right-sided chest pain after falling on 05/12/2018 EXAM: PORTABLE CHEST 1 VIEW COMPARISON:  05/14/2018.  05/13/2018. FINDINGS: Artifact overlies the chest. Previous median sternotomy and CABG with cardiomegaly. Increased density of the left lung base medially most consistent with atelectasis and or pneumonia. On the right,  I think there is a pneumothorax loculated at the right base. This appears to have enlarged. There is worsened atelectasis at the right base. Subcutaneous air veins evident on the right. Multiple right-sided rib fractures as seen previously IMPRESSION: Newly seen lucency of the right lung base that looks like an enlarging basilar pneumothorax. This is no more than about 15-20%. Alternatively, this could relate to a pattern of atelectasis, merely simulating a basilar pneumothorax. Worsened atelectasis at both lung bases. Persistent and increased subcutaneous emphysema on the right. These results will be called to the ordering clinician or representative by the Radiologist Assistant, and communication documented in the PACS or zVision Dashboard. Electronically Signed   By: Nelson Chimes M.D.   On: 05/17/2018 20:06   Dg Chest Port 1 View  Result Date: 05/14/2018 CLINICAL DATA:  67 year old male with right rib fractures. EXAM: PORTABLE CHEST 1 VIEW COMPARISON:  Chest CT dated 05/13/2018 FINDINGS: No visible pneumothorax. Right rib fractures with associated soft tissue emphysema of the right lateral chest wall. Bibasilar atelectasis. Cardiomegaly, median sternotomy wires, and CABG vascular clips. Left subclavian artery stent. IMPRESSION: 1. No visible pneumothorax. 2. Right rib fractures with associated soft tissue emphysema of the right lateral chest wall. Electronically Signed   By: Anner Crete M.D.   On: 05/14/2018 05:56   [place] Ekg Site Rite  Result Date: 05/25/2018 If Site Rite image not attached, placement could not be confirmed due to current cardiac rhythm.  [place] Ekg Site  Rite  Result Date: 05/15/2018 If Site Rite image not attached, placement could not be confirmed due to current cardiac rhythm.  US Thoracentesis Asp Pleural Space W/img Guide  Result Date: 05/29/2018 INDICATION: Symptomatic right sided pleural effusion EXAM: US THORACENTESIS ASP PLEURAL SPACE W/IMG GUIDE- PORTABLE COMPARISON:  None. MEDICATIONS: 10 cc 1% lidocaine COMPLICATIONS: None immediate. TECHNIQUE: Informed written consent was obtained from the patient after a discussion of the risks, benefits and alternatives to treatment. A timeout was performed prior to the initiation of the procedure. Initial ultrasound scanning demonstrates a right pleural effusion. The lower chest was prepped and draped in the usual sterile fashion. 1% lidocaine was used for local anesthesia. Under direct ultrasound guidance, a 19 gauge, 7-cm, Yueh catheter was introduced. An ultrasound image was saved for documentation purposes. The thoracentesis was performed. The catheter was removed and a dressing was applied. The patient tolerated the procedure well without immediate post procedural complication. The patient was escorted to have an upright chest radiograph. FINDINGS: A total of approximately 180 cc of blood tinged fluid was removed. Requested samples were sent to the laboratory. IMPRESSION: Successful ultrasound-guided right sided thoracentesis yielding 180 cc of pleural fluid. Read by Lavonia Drafts [facility] Electronically Signed   By: Jerilynn Mages.  Shick M.D.   On: 05/29/2018 13:58   US Thoracentesis Asp Pleural Space W/img Guide  Result Date: 05/23/2018 INDICATION: Recent fall with multiple right rib fractures. Right pleural effusion. Failure to wean from mechanical ventilator. Request for diagnostic and therapeutic thoracentesis. EXAM: ULTRASOUND GUIDED RIGHT THORACENTESIS MEDICATIONS: 1% lidocaine 10 mL COMPLICATIONS: None immediate. PROCEDURE: An ultrasound guided thoracentesis was thoroughly discussed with the patient and  questions answered. The benefits, risks, alternatives and complications were also discussed. The patient understands and wishes to proceed with the procedure. Written consent was obtained. Ultrasound was performed to localize and mark an adequate pocket of fluid in the right chest. The area was then prepped and draped in the normal sterile fashion. 1% Lidocaine was used for local anesthesia.  Under ultrasound guidance a 6 Fr Safe-T-Centesis catheter was introduced. Thoracentesis was performed. The catheter was removed and a dressing applied. FINDINGS: A total of approximately 450 mL of clear red fluid was removed. Samples were sent to the laboratory as requested by the clinical team. IMPRESSION: Successful ultrasound guided right thoracentesis yielding 450 mL of pleural fluid. No pneumothorax on post-procedure chest x-ray. Read by: Gareth Eagle, PA-C Electronically Signed   By: Jacqulynn Cadet M.D.   On: 05/23/2018 10:18    PERFORMANCE STATUS (ECOG) : 4 - Bedbound  Review of Systems As noted above. Otherwise, a complete review of systems is negative.  Physical Exam General: NAD, frail appearing, thin Pulmonary: Unlabored Abdomen: soft, nontender GU: no suprapubic tenderness Extremities: no edema, wrists in soft restraints Skin: no rashes Neurological: Weakness, confusion, oriented to person only  IMPRESSION: Patient is confused and is requiring a sitter due to agitation.  No family present.  Patient remains persistently delirious.  Psych is following and has started patient on olanzapine. Patient felt to have underlying dementia likely with multifactorial etiology from previous stroke and chronic etoh/drug abuse.   I called and spoke with patient's daughter by phone.  Daughter says she historically has a complicated relationship with patient due to his drug and alcohol abuse.  She has not seen him in over a year but talks to him occasionally by phone.  Patient's brother, [name] Pace, is more  involved.  He financially supports patient and sees him regularly.  Family have noticed some cognitive decline over the past year.  At baseline, patient lives at home with a girlfriend.  The girlfriend also abuses alcohol and drugs and daughter feels she is enabling patient's behavior.  Daughter says patient has a long history of cocaine and heroin use but is not sure when the last time he used these substances.  She says he was drinking 1/5 of liquor every day.  Note plan for probable eventual discharge to skilled nursing facility.  Social worker is working on disposition.  Understandably, daughter does not feel that patient however be able to return home.  We talked about long-term care placement and I recommended that she apply for Medicaid.  She asked about establishing POA but patient does not currently have capacity to complete the paperwork.  Instead, she could pursue guardianship.  Daughter is a paramedic and understands reasonably well patient's current medical problems.  She recognizes that patient's future health care might be complicated by his confusion and agitation.  She asked about a feeding tube and we discussed that at length.  She is reasonably concerned that patient would quickly pull the tube and would require frequent and recurrent hospitalization.  We talked about possible decisions such as less aggressive care, CODE STATUS, and completing a MOST Form.  She verbalized feeling that patient should not be reintubated in the future nor resuscitated.  However, she says that she does not want to make decisions alone and wants to first consult her uncle (patient's brother [name] Pace).  We discussed option of palliative care following at SNF or hospice in the setting of decline.  She would be agreeable to either option.  PLAN: Continue current scope of care Social worker working on placement Family will benefit from follow-up palliative care meeting to clarify goals and decisions  Time  Total: 60 minutes  Visit consisted of counseling and education dealing with the complex and emotionally intense issues of symptom management and palliative care in the setting of serious and potentially life-threatening illness.Greater  than 50%  of this time was spent counseling and coordinating care related to the above assessment and plan.  Signed by: Parth Mccormac, PhD, NP-C [phone] (Work Cell) 

## 2018-06-03 LAB — CULTURE, BODY FLUID W GRAM STAIN -BOTTLE: Culture: NO GROWTH

## 2018-06-03 LAB — GLUCOSE, CAPILLARY
GLUCOSE-CAPILLARY: 79 mg/dL (ref 70–99)
Glucose-Capillary: 101 mg/dL — ABNORMAL HIGH (ref 70–99)
Glucose-Capillary: 105 mg/dL — ABNORMAL HIGH (ref 70–99)
Glucose-Capillary: 88 mg/dL (ref 70–99)
Glucose-Capillary: 89 mg/dL (ref 70–99)
Glucose-Capillary: 89 mg/dL (ref 70–99)
Glucose-Capillary: 92 mg/dL (ref 70–99)

## 2018-06-03 MED ORDER — ASPIRIN 81 MG PO CHEW
81.0000 mg | CHEWABLE_TABLET | Freq: Every day | ORAL | Status: DC
  Administered 2018-06-03 – 2018-06-09 (×7): 81 mg via ORAL
  Filled 2018-06-03 (×7): qty 1

## 2018-06-03 MED ORDER — FOLIC ACID 1 MG PO TABS
1.0000 mg | ORAL_TABLET | Freq: Every day | ORAL | Status: DC
  Administered 2018-06-03 – 2018-06-09 (×7): 1 mg via ORAL
  Filled 2018-06-03 (×7): qty 1

## 2018-06-03 MED ORDER — POLYETHYLENE GLYCOL 3350 17 G PO PACK
17.0000 g | PACK | Freq: Every day | ORAL | Status: DC
  Administered 2018-06-07 – 2018-06-09 (×3): 17 g via ORAL
  Filled 2018-06-03 (×3): qty 1

## 2018-06-03 NOTE — Progress Notes (Signed)
[facility]  PROGRESS NOTE  [name] Pace YWV:[id number] DOB: 06-Jun-1951 DOA: 05/13/2018 PCP: System, Provider Not In   Brief HPI:   67 year old male with a history of CVA, prostate cancer, alcohol abuse, substance abuse, hyperlipidemia was admitted on 05/13/2018 after a fall with right-sided chest pain.  Work-up revealed multiple right-sided rib fractures and small pneumothorax which did not require chest tube.  Patient developed acute respiratory failure and also alcohol withdrawal was intubated on 05/18/2018 started on Precedex drip, Levophed.  Patient underwent right-sided thoracentesis with removal of 450 mL of clear fluid on 05/23/2018.  Patient self extubated on 05/23/18.  Since that time patient continues to be agitated, confused.  Started on NG tube feeding due to persistent inability to take by mouth. [facility] resumed care on 05/30/2018. Palliative care consulted.    Subjective   Patient seen and examined, continues to be pleasantly confused.   Assessment/Plan:     1. Delirium-multifactorial, alcohol/opiate withdrawal, ICU delirium.  He also has history of CVA.  Psychiatry was consulted on 05/31/2018, started on Seroquel which has been discontinued.  Started on Zyprexa. CT head on 05/27/2018 was negative for acute intracranial abnormality.  Continue PRN Haldol for agitation, use only if Zyprexa does not help.  2. Chronic alcoholism/substance abuse-continue thiamine, folic acid. CIWA protocol.  3. Dysphagia/inability to swallow-secondary to ongoing agitation/disorientation.  Pulled out NG tube last night, swallow eval done today, started on full liquid diet.  4. Mechanical fall/rib fractures-multiple rib fractures noticed on admission.  Continue pain management.  5. Acute hypoxic respiratory failure-resolved,In setting of multiple rib fractures /atelectasis/pneumothorax.  Initially required intubation, self extubated on 1218.  Did not require chest tube.  Continue supplemental  oxygen as needed.  Completed course of Levaquin.  6. Right layering effusion/hemothorax-history of multiple rib fractures, status post right-sided thoracentesis on 05/27/2018 with removal of 180 cc of bloody tinged fluid.  7. Goals of care-palliative care has been consulted for goals of care for persistent delirium.     CBG: Recent Labs  Lab 06/02/18 1957 06/03/18 0001 06/03/18 0404 06/03/18 0849 06/03/18 1136  GLUCAP 96 88 105* 89 89    CBC: Recent Labs  Lab 05/28/18 0518 05/29/18 0548 05/30/18 1302 06/02/18 0417  WBC 9.3 9.3 9.4 9.5  NEUTROABS 6.6 6.5 6.3 6.1  HGB 11.8* 12.1* 11.4* 12.7*  HCT 35.5* 36.9* 34.4* 37.6*  MCV 106.3* 104.8* 102.4* 103.3*  PLT 624* 621* 608* 684*    Basic Metabolic Panel: Recent Labs  Lab 05/28/18 0518 05/29/18 0451 05/30/18 0326 05/30/18 1302 05/31/18 0423 06/01/18 0350 06/02/18 0417  NA 144 138  --  136  --   --  138  K 3.4* 3.1*  --  4.6  --   --  3.7  CL 111 115*  --  109  --   --  106  CO2 22 16*  --  20*  --   --  22  GLUCOSE 122* 91  --  118*  --   --  106*  BUN 14 10  --  18  --   --  18  CREATININE 0.52* 0.39*  --  0.56*  --   --  0.63  CALCIUM 8.5* 6.6*  --  8.2*  --   --  8.7*  MG 2.0 1.5* 2.4  --  1.9 2.0  --   PHOS 3.3 3.1  --   --   --   --   --      DVT prophylaxis: Lovenox  Code Status: Full code  Family Communication: No family at bedside  Disposition Plan: Pending based on palliative care evaluation.   Consultants:  PCCM  Procedures:  Intubation mechanical ventilation   Antibiotics:   Anti-infectives (From admission, onward)   Start     Dose/Rate Route Frequency Ordered Stop   05/24/18 1230  levofloxacin (LEVAQUIN) tablet 500 mg     500 mg Oral Daily 05/24/18 1100 05/26/18 0959   05/18/18 2000  levofloxacin (LEVAQUIN) IVPB 750 mg     750 mg 100 mL/hr over 90 Minutes Intravenous Every 24 hours 05/18/18 0440 05/22/18 2240   05/18/18 0600  metroNIDAZOLE (FLAGYL) IVPB 500 mg  Status:   Discontinued     500 mg 100 mL/hr over 60 Minutes Intravenous Every 8 hours 05/18/18 0459 05/18/18 1047   05/17/18 2030  levofloxacin (LEVAQUIN) IVPB 500 mg  Status:  Discontinued     500 mg 100 mL/hr over 60 Minutes Intravenous Every 24 hours 05/17/18 1957 05/18/18 0440       Objective   Vitals:   06/02/18 1725 06/02/18 1955 06/03/18 0320 06/03/18 0625  BP: 117/88 102/76 106/82   Pulse: 70 83 99   Resp: (!) 22 19 (!) 23 (!) 23  Temp: 98 F (36.7 C) 98.7 F (37.1 C) 98.4 F (36.9 C)   TempSrc: Oral Oral Oral   SpO2: 91% 99% 99%   Weight:    64.2 kg  Height:        Intake/Output Summary (Last 24 hours) at 06/03/2018 1333 Last data filed at 06/03/2018 2951 Gross per 24 hour  Intake 20 ml  Output 375 ml  Net -355 ml   Filed Weights   06/01/18 0559 06/02/18 0500 06/03/18 0625  Weight: 66.8 kg 66.6 kg 64.2 kg     Physical Examination:   General: Appears calm Cardiovascular: S1-S2, regular, no murmur auscultated Respiratory: Clear to auscultation bilaterally, no wheezing or crackles Abdomen: Soft, nontender, no organomegaly Musculoskeletal: No edema of the lower extremities Psychiatric-confused, lacks insight and judgment, oriented to self only      Data Reviewed: I have personally reviewed following labs and imaging studies   Recent Results (from the past 240 hour(s))  Culture, body fluid-bottle     Status: None   Collection Time: 05/29/18 11:57 AM  Result Value Ref Range Status   Specimen Description PLEURAL RIGHT  Final   Special Requests NONE  Final   Culture   Final    NO GROWTH 5 DAYS Performed at [facility], [address] [place], [place] [address]    Report Status 06/03/2018 FINAL  Final  Gram stain     Status: None   Collection Time: 05/29/18 11:57 AM  Result Value Ref Range Status   Specimen Description PLEURAL RIGHT  Final   Special Requests NONE  Final   Gram Stain   Final    RARE WBC PRESENT,BOTH PMN AND MONONUCLEAR NO  ORGANISMS SEEN Performed at [facility], [place] [address], [place], [place] [address]    Report Status 05/29/2018 FINAL  Final     Liver Function Tests: Recent Labs  Lab 05/28/18 1357  PROT 6.1*   No results for input(s): LIPASE, AMYLASE in the last 168 hours. No results for input(s): AMMONIA in the last 168 hours.  Cardiac Enzymes: No results for input(s): CKTOTAL, CKMB, CKMBINDEX, TROPONINI in the last 168 hours. BNP (last 3 results) No results for input(s): BNP in the last 8760 hours.  ProBNP (last  3 results) No results for input(s): PROBNP in the last 8760 hours.    Studies: No results found.  Scheduled Meds: . aspirin  81 mg Per Tube Daily  . chlorhexidine  15 mL Mouth Rinse BID  . enoxaparin (LOVENOX) injection  40 mg Subcutaneous Q24H  . feeding supplement (PRO-STAT SUGAR FREE 64)  30 mL Per Tube Daily  . folic acid  1 mg Per Tube Daily  . free water  100 mL Per Tube Q8H  . lidocaine  2 patch Transdermal Q24H  . mouth rinse  15 mL Mouth Rinse q12n4p  . nicotine  14 mg Transdermal Daily  . OLANZapine  2.5 mg Oral QHS  . polyethylene glycol  17 g Per Tube Daily    Admission status: Inpatient: Based on patients clinical presentation and evaluation of above clinical data, I have made determination that patient meets Inpatient criteria at this time.  Patient continues to be in delirium.  Will require skilled nursing facility once stable.  Time spent: 20 min  [facility] Pager [phone]. If 7PM-7AM, please contact night-coverage at [email or web address], Office  [phone]  password [email or web address]  06/03/2018, 1:33 PM  LOS: 20 days

## 2018-06-03 NOTE — Progress Notes (Signed)
[facility]  [phone] [phone]   Name: OSWALD POTT Date: 06/03/2018 MRN: [id number]  DOB: August 21, 1950  Patient Care Team: System, Provider Not In as PCP - General    REASON FOR CONSULTATION: Palliative Care consult requested for this 67 y.o. male with multiple medical problems including alcohol abuse, h/o seizures, CAD, history of CVA, who was admitted to the hospital on 05/14/2018 with right chest wall pain following a fall at home.  Patient was found to have multiple right rib fractures and a right-sided pneumothorax.  Unfortunately patient later developed acute respiratory failure and alcohol withdrawal requiring intubation on 12/13.  He self extubated on 12/18.  Hospitalization has been further complicated by persistent delirium, agitation, and dysphasia.  Patient has required use of a sitter.  Palliative care was consulted to help address goals.   CODE STATUS: Full code  PAST MEDICAL HISTORY: Past Medical History:  Diagnosis Date  . Alcohol abuse   . Anxiety   . Breast cancer ([place]) Left breast   Per patient diagnosed in early 29's.   . Coronary artery disease   . High cholesterol   . Prostate cancer ([place])    per patient diagnosed in early 21's  . Stroke [facility])     PAST SURGICAL HISTORY:  Past Surgical History:  Procedure Laterality Date  . CAROTID STENT    . CORONARY ARTERY BYPASS GRAFT    . FEMUR IM NAIL Left 07/02/2014   Procedure: INTRAMEDULLARY (IM) NAIL FEMORAL;  Surgeon: Elie Goody, MD;  Location: [facility];  Service: Orthopedics;  Laterality: Left;  . I&D EXTREMITY Right 02/10/2016   Procedure: IRRIGATION AND DEBRIDEMENT EXTREMITY;  Surgeon: Dorna Leitz, MD;  Location: [facility];  Service: Orthopedics;  Laterality: Right;  . TENDON REPAIR Right 02/10/2016   Procedure: Anterior TENDON REPAIR and wound exploration.;  Surgeon: Dorna Leitz, MD;  Location: [facility];  Service: Orthopedics;  Laterality:  Right;    HEMATOLOGY/ONCOLOGY HISTORY:   No history exists.    ALLERGIES:  is allergic to penicillins.  MEDICATIONS:  Current Facility-Administered Medications  Medication Dose Route Frequency Provider Last Rate Last Dose  . 0.9 %  sodium chloride infusion   Intravenous PRN Brand Males, MD 10 mL/hr at 05/26/18 0522 10 mL/hr at 05/26/18 0522  . aspirin chewable tablet 81 mg  81 mg Per Tube Daily Skeet Simmer, RPH   81 mg at 06/02/18 1030  . chlorhexidine (PERIDEX) 0.12 % solution 15 mL  15 mL Mouth Rinse BID Shelly Coss, MD   15 mL at 06/02/18 2252  . enoxaparin (LOVENOX) injection 40 mg  40 mg Subcutaneous Q24H Corey Harold, NP   40 mg at 06/02/18 1031  . feeding supplement (OSMOLITE 1.5 CAL) liquid 1,000 mL  1,000 mL Per Tube Continuous Brand Males, MD 60 mL/hr at 06/02/18 0655 1,000 mL at 06/02/18 0655  . feeding supplement (PRO-STAT SUGAR FREE 64) liquid 30 mL  30 mL Per Tube Daily Brand Males, MD   30 mL at 06/02/18 1032  . fentaNYL (SUBLIMAZE) injection 25-100 mcg  25-100 mcg Intravenous Q2H PRN Brand Males, MD   50 mcg at 06/03/18 0344  . folic acid (FOLVITE) tablet 1 mg  1 mg Per Tube Daily Brand Males, MD   1 mg at 06/02/18 1030  . free water 100 mL  100 mL Per Tube Q8H Corey Harold, NP   100 mL at 06/02/18 1426  . haloperidol lactate (HALDOL)  injection 2 mg  2 mg Intravenous Q6H PRN Corey Harold, NP   2 mg at 06/02/18 1334  . ibuprofen (ADVIL,MOTRIN) 100 MG/5ML suspension 400 mg  400 mg Oral Q6H PRN Adhikari, Amrit, MD      . ipratropium-albuterol (DUONEB) 0.5-2.5 (3) MG/3ML nebulizer solution 3 mL  3 mL Nebulization Q6H PRN Rai, Ripudeep K, MD   3 mL at 05/31/18 0437  . lidocaine (LIDODERM) 5 % 2 patch  2 patch Transdermal Q24H Kipp Brood, MD   2 patch at 06/02/18 1321  . MEDLINE mouth rinse  15 mL Mouth Rinse q12n4p Adhikari, Amrit, MD   15 mL at 06/02/18 1626  . nicotine (NICODERM CQ - dosed in mg/24 hours) patch 14 mg  14 mg  Transdermal Daily Brand Males, MD   14 mg at 06/02/18 1031  . OLANZapine (ZYPREXA) tablet 2.5 mg  2.5 mg Oral QHS Shelly Coss, MD   2.5 mg at 06/01/18 2220  . OLANZapine (ZYPREXA) tablet 2.5 mg  2.5 mg Oral Daily PRN Shelly Coss, MD   2.5 mg at 06/01/18 0111  . ondansetron (ZOFRAN) tablet 4 mg  4 mg Oral Q6H PRN Rai, Ripudeep K, MD       Or  . ondansetron (ZOFRAN) injection 4 mg  4 mg Intravenous Q6H PRN Rai, Ripudeep K, MD   4 mg at 05/24/18 1042  . polyethylene glycol (MIRALAX / GLYCOLAX) packet 17 g  17 g Per Tube Daily Brand Males, MD   17 g at 06/01/18 0927  . sodium chloride flush (NS) 0.9 % injection 10-40 mL  10-40 mL Intracatheter PRN Rush Farmer, MD   10 mL at 06/02/18 2253    VITAL SIGNS: BP 106/82 (BP Location: Left Arm)   Pulse 99   Temp 98.4 F (36.9 C) (Oral)   Resp (!) 23   Ht 6\' 2"  (1.88 m)   Wt 64.2 kg   SpO2 99%   BMI 18.17 kg/m  Filed Weights   06/01/18 0559 06/02/18 0500 06/03/18 0625  Weight: 66.8 kg 66.6 kg 64.2 kg    Estimated body mass index is 18.17 kg/m as calculated from the following:   Height as of this encounter: 6\' 2"  (1.88 m).   Weight as of this encounter: 64.2 kg.  LABS: CBC:    Component Value Date/Time   WBC 9.5 06/02/2018 0417   HGB 12.7 (L) 06/02/2018 0417   HCT 37.6 (L) 06/02/2018 0417   PLT 684 (H) 06/02/2018 0417   MCV 103.3 (H) 06/02/2018 0417   NEUTROABS 6.1 06/02/2018 0417   LYMPHSABS 1.5 06/02/2018 0417   MONOABS 1.1 (H) 06/02/2018 0417   EOSABS 0.6 (H) 06/02/2018 0417   BASOSABS 0.1 06/02/2018 0417   Comprehensive Metabolic Panel:    Component Value Date/Time   NA 138 06/02/2018 0417   K 3.7 06/02/2018 0417   CL 106 06/02/2018 0417   CO2 22 06/02/2018 0417   BUN 18 06/02/2018 0417   CREATININE 0.63 06/02/2018 0417   GLUCOSE 106 (H) 06/02/2018 0417   CALCIUM 8.7 (L) 06/02/2018 0417   AST 36 05/22/2018 0502   ALT 15 05/22/2018 0502   ALKPHOS 45 05/22/2018 0502   BILITOT 1.0 05/22/2018 0502     PROT 6.1 (L) 05/28/2018 1357   ALBUMIN 1.6 (L) 05/23/2018 0340    RADIOGRAPHIC STUDIES: Dg Chest 1 View  Result Date: 05/23/2018 CLINICAL DATA:  Status post right-sided thoracentesis with removal of 450 cc of fluid. EXAM: CHEST  1 VIEW  COMPARISON:  Portable chest x-ray of May 22, 2018 FINDINGS: The volume of pleural fluid on the right has decreased somewhat. There is no postprocedure pneumothorax. There is no mediastinal shift. The left lung exhibits medial basal density compatible with atelectasis. The heart and pulmonary vascularity are normal. There are post CABG changes. The endotracheal tube tip projects approximately 3.4 cm above the carina. The esophagogastric tube tip projects below the inferior margin of the image. The right internal jugular venous catheter tip projects over the midportion of the SVC. IMPRESSION: No postprocedure complication following right-sided thoracentesis. Persistent bibasilar atelectasis. Small right pleural effusion. The support tubes are in reasonable position. Electronically Signed   By: David  Martinique M.D.   On: 05/23/2018 09:31   Dg Abd 1 View  Result Date: 05/27/2018 CLINICAL DATA:  Nasogastric tube repositioning. EXAM: ABDOMEN - 1 VIEW COMPARISON:  Abdominal radiograph performed earlier today at 6:39 p.m. FINDINGS: The patient's enteric tube is noted coiled at the antrum and body of the stomach. The visualized bowel gas pattern is grossly unremarkable. No acute osseous abnormalities are seen. The patient is status post median sternotomy. A small right pleural effusion is again noted, with overlying displaced rib fractures. IMPRESSION: Enteric tube noted coiled at the antrum and body of the stomach. Electronically Signed   By: Garald Balding M.D.   On: 05/27/2018 22:18   Dg Abd 1 View  Result Date: 05/27/2018 CLINICAL DATA:  NG tube placement EXAM: ABDOMEN - 1 VIEW COMPARISON:  None. FINDINGS: Esophageal tube is folded back upon itself over the stomach.  There may be a kink in the distal tube. Elevated right diaphragm with pleural and parenchymal disease. Nonobstructed bowel-gas pattern. IMPRESSION: Esophageal tube is folded back upon itself distally with possible kink in the distal tube. The tip is positioned over the expected location of gastric body Electronically Signed   By: Donavan Foil M.D.   On: 05/27/2018 19:52   Ct Head Wo Contrast  Result Date: 05/30/2018 CLINICAL DATA:  Altered mental status. History of breast cancer, stroke and alcohol abuse. EXAM: CT HEAD WITHOUT CONTRAST TECHNIQUE: Contiguous axial images were obtained from the base of the skull through the vertex without intravenous contrast. COMPARISON:  CT HEAD July 12, 2005 FINDINGS: BRAIN: No intraparenchymal hemorrhage, mass effect nor midline shift. Moderate parenchymal brain volume loss. Severe vermian atrophy. No hydrocephalus. Old cystic RIGHT basal ganglia infarct with mild ex vacuo dilatation subjacent ventricle. Patchy to confluent supratentorial white matter hypodensities. No acute large vascular territory infarcts. No abnormal extra-axial fluid collections. Basal cisterns are patent. VASCULAR: Moderate calcific atherosclerosis of the carotid siphons. SKULL: No skull fracture. No significant scalp soft tissue swelling. SINUSES/ORBITS: Mild lobulated mucosal thickening. No paranasal sinus air-fluid levels. Mastoid air cells are well aerated.The included ocular globes and orbital contents are non-suspicious. OTHER: None. IMPRESSION: 1. No acute intracranial process. 2. Old RIGHT basal ganglia infarct. Moderate chronic small vessel ischemic changes. 3. Mild parenchymal brain volume loss, severe vermian atrophy. Electronically Signed   By: Elon Alas M.D.   On: 05/30/2018 00:40   Ct Chest Wo Contrast  Result Date: 05/19/2018 CLINICAL DATA:  Pneumothorax. EXAM: CT CHEST WITHOUT CONTRAST TECHNIQUE: Multidetector CT imaging of the chest was performed following the standard  protocol without IV contrast. COMPARISON:  Radiographs of same day.  CT scan of May 13, 2018. FINDINGS: Cardiovascular: Atherosclerosis of thoracic aorta is noted. Coronary artery calcifications are noted. Normal cardiac size. No pericardial effusion. 2.4 x 1.9 cm rounded abnormality is seen  to the left of ascending thoracic aorta concerning for aneurysm. Mediastinum/Nodes: Endotracheal and nasogastric tubes are in grossly good position. No significant adenopathy is noted. Thyroid gland is unremarkable. Lungs/Pleura: No definite pneumothorax is noted. Mild bilateral pleural effusions are noted with adjacent atelectasis or infiltrates of the lower lobes and right upper lobe. Upper Abdomen: No acute abnormality. Musculoskeletal: Multiple moderately displaced fractures are seen involving the right ribs. Subcutaneous emphysema is seen overlying the right chest wall. IMPRESSION: No definite pneumothorax is noted. Moderate bilateral pleural effusions are noted with adjacent atelectasis or infiltrate seen in both lower lobes and right upper lobe, right greater than left. Multiple moderately displaced right rib fractures are noted with overlying subcutaneous emphysema of right chest wall. Endotracheal and nasogastric tubes are unchanged in position. Coronary artery calcifications are noted. 2.4 cm rounded abnormality is seen to the left of ascending thoracic aorta which is concerning for possible aneurysm. CTA of the chest is recommended for further evaluation. Aortic Atherosclerosis (ICD10-I70.0). Electronically Signed   By: Marijo Conception, M.D.   On: 05/19/2018 12:56   Ct Chest Wo Contrast  Result Date: 05/14/2018 CLINICAL DATA:  66 year old male with fall and trauma to the right chest wall. EXAM: CT CHEST WITHOUT CONTRAST TECHNIQUE: Multidetector CT imaging of the chest was performed following the standard protocol without IV contrast. COMPARISON:  Chest radiograph dated 07/02/2014 FINDINGS: Evaluation of this  exam is limited in the absence of intravenous contrast. Cardiovascular: Top-normal cardiac size. No pericardial effusion. Multi vessel coronary vascular calcification and postsurgical changes of CABG. A 2.3 x 2.1 cm rounded structure in the anterior mediastinum anterior to the pulmonary trunk (series 3, image 70) is not characterized on this noncontrast CT but most likely represents a saccular aneurysm. This can be further evaluated with contrast enhanced CT. There is mild atherosclerotic calcification of the thoracic aorta. The central pulmonary arteries are grossly unremarkable. A left subclavian stent is not well evaluated on this noncontrast CT. Mediastinum/Nodes: No hilar or mediastinal adenopathy. Small hiatal hernia. No mediastinal fluid collection. Lungs/Pleura: There is a background of emphysema. Minimal bibasilar atelectatic changes. There is a trace right pleural effusion. Minimal pneumothorax in the right apex measures 3 mm in thickness. No focal consolidation. Small amount of mucus material noted in the central airway. The airways are otherwise patent. Upper Abdomen: Cholecystectomy. The visualized upper abdomen is otherwise unremarkable. Musculoskeletal: Multiple mildly displaced right rib fractures involving the right 5th-11th ribs. There is a small amount of chest wall emphysema. Right scapular fixation screws. IMPRESSION: 1. Multiple mildly displaced right rib fractures involving the right 5th-11th ribs. 2. Minimal right apical pneumothorax. 3. Rounded structure in the anterior mediastinum, likely a saccular aneurysm. This can be further evaluated with contrast enhanced CT on a nonemergent basis. These results were called by telephone at the time of interpretation on 05/14/2018 at 12:18 am to Dr. Addison Lank , who verbally acknowledged these results. Electronically Signed   By: Anner Crete M.D.   On: 05/14/2018 00:20   Dg Chest Port 1 View  Result Date: 05/29/2018 CLINICAL DATA:   67 year old male status post ultrasound-guided right side thoracentesis this morning. Status post fall with right chest wall trauma earlier this month. EXAM: PORTABLE CHEST 1 VIEW COMPARISON:  Portable chest 05/28/2018 and earlier. FINDINGS: Portable AP semi upright view at 1219 hours. Decreased veiling opacity in the right lung compared to yesterday. No pneumothorax. Stable cardiac size and mediastinal contours. Stable right PICC line and visible enteric feeding tube. Prior CABG.  Left common carotid artery region vascular stent. Stable and negative left lung ventilation. Numerous displaced right lateral rib fractures again noted. IMPRESSION: Decreased right pleural effusion and no pneumothorax following thoracentesis. Electronically Signed   By: Genevie Ann M.D.   On: 05/29/2018 12:34   Dg Chest Port 1 View  Result Date: 05/28/2018 CLINICAL DATA:  Respiratory failure EXAM: PORTABLE CHEST 1 VIEW COMPARISON:  May 27, 2018 FINDINGS: Feeding tube tip is in the stomach. Central catheter tip is in the superior vena cava. No pneumothorax a. There is a moderate pleural effusion on the right with consolidation throughout the right mid and lower lung zones. There is mild upper lobe atelectatic change bilaterally. No consolidation on the left. There is cardiomegaly with pulmonary venous hypertension. Patient is status post coronary artery bypass grafting. A stent is noted in the left subclavian region medially, stable. IMPRESSION: Tube and catheter positions as described without pneumothorax. Right pleural effusion with consolidation in the right mid and lower lung zones. Some of this consolidation is due to atelectasis. Pneumonia on the right is suspected, however. There is mild upper lobe atelectasis bilaterally. There is pulmonary vascular congestion. Postoperative changes are noted. Electronically Signed   By: Lowella Grip III M.D.   On: 05/28/2018 07:10   Dg Chest Port 1 View  Result Date:  05/27/2018 CLINICAL DATA:  Increased shortness of breath, history breast cancer, coronary artery disease, prostate cancer EXAM: PORTABLE CHEST 1 VIEW COMPARISON:  Portable exam 1316 hours compared to 05/26/2018 FINDINGS: RIGHT arm PICC line tip projects over SVC. Feeding tube extends into stomach. Borderline enlargement of cardiac silhouette post CABG. Mediastinal contours and pulmonary vascularity normal. Persistent RIGHT pleural effusion and basilar atelectasis versus consolidation. LEFT lung clear. No pneumothorax. Bones demineralized. IMPRESSION: Persistent RIGHT pleural effusion and basilar atelectasis versus consolidation. Borderline enlargement of cardiac silhouette post CABG. Electronically Signed   By: Lavonia Dana M.D.   On: 05/27/2018 13:41   Dg Chest Port 1 View  Result Date: 05/26/2018 CLINICAL DATA:  Acute onset of respiratory distress. Agitation. EXAM: PORTABLE CHEST 1 VIEW COMPARISON:  Chest radiograph performed 05/23/2018 FINDINGS: A small right pleural effusion is noted. Hazy right-sided and left basilar airspace opacification raises concern for pneumonia, though asymmetric pulmonary edema might have a similar appearance. No pneumothorax is seen. The cardiomediastinal silhouette is mildly enlarged. The patient is status post median sternotomy, with evidence of prior CABG. An enteric tube is noted extending overlying the body of the stomach. A right PICC is noted ending about the mid SVC. Postoperative change is noted at the right glenoid, with chronic loss of the right glenohumeral joint space. No acute osseous abnormalities are identified. IMPRESSION: 1. Small right pleural effusion noted. Hazy right-sided and left basilar airspace opacification raises concern for pneumonia, though asymmetric pulmonary edema might have a similar appearance. 2. Mild cardiomegaly. Electronically Signed   By: Garald Balding M.D.   On: 05/26/2018 23:20   Dg Chest Port 1 View  Result Date: 05/23/2018 CLINICAL  DATA:  Recent fall with chest pain and cough, initial encounter EXAM: PORTABLE CHEST 1 VIEW COMPARISON:  05/23/18 FINDINGS: Endotracheal tube and nasogastric catheter have been removed in the interval. Right jugular central line remains in place. Postsurgical changes are seen. Cardiac shadow is stable. Left subclavian arterial stent is seen. Multiple right-sided rib fractures are noted with slight increase in the degree of right-sided pleural effusion and underlying atelectatic changes. No pneumothorax is seen. IMPRESSION: Increasing right-sided effusion and atelectasis. Multiple right rib  fractures. Electronically Signed   By: Inez Catalina M.D.   On: 05/23/2018 12:24   Dg Chest Port 1 View  Result Date: 05/22/2018 CLINICAL DATA:  Respiratory failure EXAM: PORTABLE CHEST 1 VIEW COMPARISON:  05/21/2018 FINDINGS: Support devices are stable. Cardiomegaly. Bilateral airspace disease and layering effusions, right greater than left. This could reflect asymmetric edema or infection. No significant change since prior study. IMPRESSION: Bilateral airspace disease and layering effusions, right greater than left. This could reflect asymmetric edema or infection. No change. Electronically Signed   By: Rolm Baptise M.D.   On: 05/22/2018 08:42   Dg Chest Port 1 View  Result Date: 05/21/2018 CLINICAL DATA:  Ventilator support EXAM: PORTABLE CHEST 1 VIEW COMPARISON:  05/19/2018 FINDINGS: Endotracheal tube tip is 3 cm above the carina. Nasogastric tube enters the abdomen. Right internal jugular central line tip is in the SVC 2 cm above the right atrium. Bilateral lower lobe volume loss persists with pleural fluid on the right. Findings are slightly worsened since 2 days ago. IMPRESSION: Worsening lower lobe volume loss and effusions right larger than left. Electronically Signed   By: Nelson Chimes M.D.   On: 05/21/2018 07:26   Dg Chest Port 1 View  Result Date: 05/19/2018 CLINICAL DATA:  Hemothorax EXAM: PORTABLE CHEST  1 VIEW COMPARISON:  05/19/2018 FINDINGS: Support devices are stable. Multiple right lateral rib fractures are again noted. Bilateral pleural effusions, right greater than left. Bibasilar atelectasis or infiltrates. No visible pneumothorax. IMPRESSION: No significant change since prior study. Electronically Signed   By: Rolm Baptise M.D.   On: 05/19/2018 16:09   Dg Chest Port 1 View  Result Date: 05/19/2018 CLINICAL DATA:  History of ETT EXAM: PORTABLE CHEST 1 VIEW COMPARISON:  May 18, 2018 FINDINGS: The ETT terminates in good position. The right central line is stable and unremarkable. The enteric tube terminates below today's film. No pneumothorax. Numerous right-sided rib fractures again noted. Right-sided pleural effusion with underlying opacity is similar in the interval. Possible small layering effusion on the left, less prominent. The cardiomediastinal silhouette is stable. No other acute abnormalities. IMPRESSION: 1. Support apparatus as above. 2. Numerous right-sided rib fractures, stable.  No pneumothorax. 3. Right greater than left pleural effusions with underlying opacities, stable on the right and improved on the left. Electronically Signed   By: Dorise Bullion III M.D   On: 05/19/2018 10:04   Dg Chest Port 1 View  Result Date: 05/18/2018 CLINICAL DATA:  Encounter for central line placement. EXAM: PORTABLE CHEST 1 VIEW COMPARISON:  05/18/2018 FINDINGS: There are postoperative changes from median sternotomy and CABG procedure. Right IJ catheter tip projects over the distal SVC. There is an ET tube with tip above the carina. Enteric tube tip is below the level of the GE junction. Normal heart size. Persistent bilateral pleural effusions are identified. Mild interstitial edema noted. There is a air fluid level identified overlying the right mid and lower lung which may reflect a component of loculated pneumothorax. Multiple right lateral rib fracture deformities are identified. Similar to  previous exam. Subcutaneous gas within the right lateral chest wall noted. IMPRESSION: 1. Persistent bilateral pleural effusions. There is a fluid level projecting over the right mid and lower lung which is indeterminate but could represent a component of loculated hydropneumothorax. This could be better assessed with CT of the chest. 2. Right rib fractures with overlying subcutaneous emphysema. Electronically Signed   By: Kerby Moors M.D.   On: 05/18/2018 10:46   Dg  Chest Port 1 View  Result Date: 05/18/2018 CLINICAL DATA:  ET tube adjustment EXAM: PORTABLE CHEST 1 VIEW COMPARISON:  May 18, 2018 FINDINGS: An ET tube terminates in the mid trachea, in good position. An enteric tube terminates below today's film. No pneumothorax. Air in the right chest wall remains, stable. Stable cardiomegaly. The hila and mediastinum are unchanged. Layering effusion on the right with underlying opacity, similar. Mild patchy opacity in left base, slightly more prominent. No other interval changes. IMPRESSION: 1. Support apparatus as above. 2. Stable effusion and underlying opacity in the right base. 3. Mild opacity in the left base is slightly more prominent in the interval. Recommend attention on follow-up. Electronically Signed   By: Dorise Bullion III M.D   On: 05/18/2018 09:05   Dg Chest Port 1 View  Result Date: 05/18/2018 CLINICAL DATA:  Respiratory difficulty EXAM: PORTABLE CHEST 1 VIEW COMPARISON:  05/18/2018 418 hours FINDINGS: NG tube has been placed. The tip is in the antrum of the stomach. Endotracheal tube is not clearly visualized. Normal heart size. Low lung volumes. Bibasilar opacity likely combination of atelectasis and airspace disease is stable. No evidence of pneumothorax. Right-sided rib fractures are again noted. IMPRESSION: NG tube placed into the stomach. Endotracheal tube is not visualized. Stable bibasilar pulmonary opacities as described No sign of pneumothorax. Multiple right-sided rib  fractures are unchanged. Electronically Signed   By: Marybelle Killings M.D.   On: 05/18/2018 08:24   Dg Chest Port 1 View  Result Date: 05/18/2018 CLINICAL DATA:  66 y/o  M; increased shortness of breath. EXAM: PORTABLE CHEST 1 VIEW COMPARISON:  05/17/2018 chest radiograph. FINDINGS: Multiple displaced right lateral rib fractures are stable. Subcutaneous emphysema within the right chest wall again noted. Linear structure at the right lung base is stable possibly representing subpulmonic pneumothorax or atelectasis as described on prior study. Stable bibasilar atelectasis. Stable right effusion. Stable cardiac silhouette given projection and technique. Post median sternotomy and CABG with wires in alignment. Stable break in the sternotomy wire second from bottom. Postsurgical changes of the right shoulder joint. IMPRESSION: 1. Stable linear structure at the right lung base possibly representing subpulmonic pneumothorax or atelectasis as described on prior study. 2. Stable displaced right lateral rib fractures and subcutaneous emphysema of the right chest wall. 3. Stable bibasilar atelectasis and right-sided effusion. Electronically Signed   By: Kristine Garbe M.D.   On: 05/18/2018 05:35   Dg Chest Port 1 View  Result Date: 05/17/2018 CLINICAL DATA:  Severe right-sided chest pain after falling on 05/12/2018 EXAM: PORTABLE CHEST 1 VIEW COMPARISON:  05/14/2018.  05/13/2018. FINDINGS: Artifact overlies the chest. Previous median sternotomy and CABG with cardiomegaly. Increased density of the left lung base medially most consistent with atelectasis and or pneumonia. On the right, I think there is a pneumothorax loculated at the right base. This appears to have enlarged. There is worsened atelectasis at the right base. Subcutaneous air veins evident on the right. Multiple right-sided rib fractures as seen previously IMPRESSION: Newly seen lucency of the right lung base that looks like an enlarging basilar  pneumothorax. This is no more than about 15-20%. Alternatively, this could relate to a pattern of atelectasis, merely simulating a basilar pneumothorax. Worsened atelectasis at both lung bases. Persistent and increased subcutaneous emphysema on the right. These results will be called to the ordering clinician or representative by the Radiologist Assistant, and communication documented in the PACS or zVision Dashboard. Electronically Signed   By: Jan Fireman.D.  On: 05/17/2018 20:06   Dg Chest Port 1 View  Result Date: 05/14/2018 CLINICAL DATA:  67 year old male with right rib fractures. EXAM: PORTABLE CHEST 1 VIEW COMPARISON:  Chest CT dated 05/13/2018 FINDINGS: No visible pneumothorax. Right rib fractures with associated soft tissue emphysema of the right lateral chest wall. Bibasilar atelectasis. Cardiomegaly, median sternotomy wires, and CABG vascular clips. Left subclavian artery stent. IMPRESSION: 1. No visible pneumothorax. 2. Right rib fractures with associated soft tissue emphysema of the right lateral chest wall. Electronically Signed   By: Anner Crete M.D.   On: 05/14/2018 05:56   [place] Ekg Site Rite  Result Date: 05/25/2018 If Site Rite image not attached, placement could not be confirmed due to current cardiac rhythm.  [place] Ekg Site Rite  Result Date: 05/15/2018 If Site Rite image not attached, placement could not be confirmed due to current cardiac rhythm.  US Thoracentesis Asp Pleural Space W/img Guide  Result Date: 05/29/2018 INDICATION: Symptomatic right sided pleural effusion EXAM: US THORACENTESIS ASP PLEURAL SPACE W/IMG GUIDE- PORTABLE COMPARISON:  None. MEDICATIONS: 10 cc 1% lidocaine COMPLICATIONS: None immediate. TECHNIQUE: Informed written consent was obtained from the patient after a discussion of the risks, benefits and alternatives to treatment. A timeout was performed prior to the initiation of the procedure. Initial ultrasound scanning demonstrates a right pleural  effusion. The lower chest was prepped and draped in the usual sterile fashion. 1% lidocaine was used for local anesthesia. Under direct ultrasound guidance, a 19 gauge, 7-cm, Yueh catheter was introduced. An ultrasound image was saved for documentation purposes. The thoracentesis was performed. The catheter was removed and a dressing was applied. The patient tolerated the procedure well without immediate post procedural complication. The patient was escorted to have an upright chest radiograph. FINDINGS: A total of approximately 180 cc of blood tinged fluid was removed. Requested samples were sent to the laboratory. IMPRESSION: Successful ultrasound-guided right sided thoracentesis yielding 180 cc of pleural fluid. Read by Lavonia Drafts [facility] Electronically Signed   By: Jerilynn Mages.  Shick M.D.   On: 05/29/2018 13:58   US Thoracentesis Asp Pleural Space W/img Guide  Result Date: 05/23/2018 INDICATION: Recent fall with multiple right rib fractures. Right pleural effusion. Failure to wean from mechanical ventilator. Request for diagnostic and therapeutic thoracentesis. EXAM: ULTRASOUND GUIDED RIGHT THORACENTESIS MEDICATIONS: 1% lidocaine 10 mL COMPLICATIONS: None immediate. PROCEDURE: An ultrasound guided thoracentesis was thoroughly discussed with the patient and questions answered. The benefits, risks, alternatives and complications were also discussed. The patient understands and wishes to proceed with the procedure. Written consent was obtained. Ultrasound was performed to localize and mark an adequate pocket of fluid in the right chest. The area was then prepped and draped in the normal sterile fashion. 1% Lidocaine was used for local anesthesia. Under ultrasound guidance a 6 Fr Safe-T-Centesis catheter was introduced. Thoracentesis was performed. The catheter was removed and a dressing applied. FINDINGS: A total of approximately 450 mL of clear red fluid was removed. Samples were sent to the laboratory as requested  by the clinical team. IMPRESSION: Successful ultrasound guided right thoracentesis yielding 450 mL of pleural fluid. No pneumothorax on post-procedure chest x-ray. Read by: Gareth Eagle, PA-C Electronically Signed   By: Jacqulynn Cadet M.D.   On: 05/23/2018 10:18    PERFORMANCE STATUS (ECOG) : 4 - Bedbound  Review of Systems As noted above. Otherwise, a complete review of systems is negative.  Physical Exam General: frail appearing, in bed, restrained Cardiovascular: regular rate and rhythm Pulmonary: clear  ant fields Abdomen: soft, nontender, + bowel sounds GU: condom cath Extremities: no edema Skin: no rashes Neurological: Weakness, alert but confused, oriented to person only  IMPRESSION: Patient is comfortable appearing. He is alert and pleasantly confused. He has soft wrist restraints applied. No family present in room.   I note that patient pulled his NGT overnight. He has been evaluted by ST and started on a liquid diet.   I called and spoke again with patient's daughter. I updated her on patient's status today. She has talked with her uncle (patient's brother) who will be coming to the hospital at some point but she was not sure when. She wants him involved in decision making. She talked with him about completing a MOST form and code status. Again, she sounds like she would make him a DNR. However, she is not prepared to make that decision until her uncle agrees.   We talked about the need for possible guardianship in the future. She would benefit from speaking with SW about resources.   PLAN: Continue current scope of treatment Family would benefit from SW referral to help guide them about guardianship process as it seems unlikely that patient will be able to complete POA/HCPOA documents in the future   Time Total: 30 minutes  Visit consisted of counseling and education dealing with the complex and emotionally intense issues of symptom management and palliative care in the  setting of serious and potentially life-threatening illness.Greater than 50%  of this time was spent counseling and coordinating care related to the above assessment and plan.  Signed by: Altha Harm, PhD, NP-C [phone] (Work Cell)

## 2018-06-03 NOTE — Progress Notes (Signed)
  Speech Language Pathology Treatment: Dysphagia  Patient Details Name: [name] Pace MRN: [id number] DOB: 1950/08/10 Today's Date: 06/03/2018 Time: 5366-4403 SLP Time Calculation (min) (ACUTE ONLY): 20 min  Assessment / Plan / Recommendation Clinical Impression  Called to reassess pt after he removed NG last night.  Today, he remains confused, hallucinatory, but responds to questions and engages in social conversation pleasantly.  Vocal quality improved, beginning to follow commands intermittently.  Pt consumed three oz water with no s/s of aspiration.  Self-fed, with assistance, pudding with intervals of drinking additional water from a straw.  He finished 4 oz pudding and 6 oz of water with no s/s of aspiration.  D/W nurse, MD.  Start full liquid diet (pt began to c/o stomach hurting and he didn't want to continue eating); meds whole in puree.  Doubt instrumental swallow study will be necessary now.  SLP will follow briefly for safety/toleration, diet advancement in context of MS changes.     HPI HPI: Patient is a 67 year old male with history of alcohol abuse, CAD, history of CVA presented to ED with severe right-sided chest wall pain after a fall on the night of 12/7. Pt with R rib fxs 5-11. Intubated 12/13, self extubated 12/18.  NPO, pulled cortrak 12/28.       SLP Plan  Continue with current plan of care       Recommendations  Diet recommendations: Other(comment)(full liquid diet) Liquids provided via: Cup;Straw Medication Administration: Whole meds with puree Supervision: Staff to assist with self feeding Compensations: Minimize environmental distractions                Oral Care Recommendations: Oral care BID Follow up Recommendations: 24 hour supervision/assistance SLP Visit Diagnosis: Dysphagia, unspecified (R13.10) Plan: Continue with current plan of care       GO                Juan Quam Laurice 06/03/2018, 9:29 AM  Estill Bamberg L. Tivis Ringer, [place] Office number [phone] Pager [phone]

## 2018-06-04 ENCOUNTER — Inpatient Hospital Stay (HOSPITAL_COMMUNITY): Payer: Medicare Other

## 2018-06-04 LAB — AMMONIA: AMMONIA: 34 umol/L (ref 9–35)

## 2018-06-04 LAB — GLUCOSE, CAPILLARY
Glucose-Capillary: 85 mg/dL (ref 70–99)
Glucose-Capillary: 95 mg/dL (ref 70–99)
Glucose-Capillary: 89 mg/dL (ref 70–99)
Glucose-Capillary: 86 mg/dL (ref 70–99)
Glucose-Capillary: 123 mg/dL — ABNORMAL HIGH (ref 70–99)

## 2018-06-04 LAB — TSH: TSH: 1.26 u[IU]/mL (ref 0.350–4.500)

## 2018-06-04 MED ORDER — LORAZEPAM 2 MG/ML IJ SOLN
1.0000 mg | INTRAMUSCULAR | Status: DC | PRN
  Administered 2018-06-04 – 2018-06-08 (×12): 1 mg via INTRAVENOUS
  Filled 2018-06-04 (×13): qty 1

## 2018-06-04 MED ORDER — THIAMINE HCL 100 MG/ML IJ SOLN
500.0000 mg | Freq: Three times a day (TID) | INTRAVENOUS | Status: AC
  Administered 2018-06-04 – 2018-06-07 (×9): 500 mg via INTRAVENOUS
  Filled 2018-06-04 (×9): qty 5

## 2018-06-04 MED ORDER — LORAZEPAM 2 MG/ML IJ SOLN
2.0000 mg | Freq: Once | INTRAMUSCULAR | Status: AC
  Administered 2018-06-04: 2 mg via INTRAVENOUS

## 2018-06-04 MED ORDER — VITAMIN B-1 100 MG PO TABS: 100.0000 mg | ORAL_TABLET | Freq: Every day | ORAL | Status: DC

## 2018-06-04 MED ORDER — LORAZEPAM 2 MG/ML IJ SOLN
INTRAMUSCULAR | Status: AC
  Filled 2018-06-04: qty 1

## 2018-06-04 NOTE — Progress Notes (Signed)
[facility]  PROGRESS NOTE  [name] Pace WFU:[id number] DOB: 26-Jun-1950 DOA: 05/13/2018 PCP: System, Provider Not In   Brief HPI:   67 year old male with a history of CVA, prostate cancer, alcohol abuse, substance abuse, hyperlipidemia was admitted on 05/13/2018 after a fall with right-sided chest pain.  Work-up revealed multiple right-sided rib fractures and small pneumothorax which did not require chest tube.  Patient developed acute respiratory failure and also alcohol withdrawal was intubated on 05/18/2018 started on Precedex drip, Levophed.  Patient underwent right-sided thoracentesis with removal of 450 mL of clear fluid on 05/23/2018.  Patient self extubated on 05/23/18.  Since that time patient continues to be agitated, confused.  Started on NG tube feeding due to persistent inability to take by mouth. TRH resumed care on 05/30/2018. Palliative care consulted.    Subjective   Patient seen and examined, earlier was pleasantly confused but later nursing staff called that patient became agitated and was kicking the staff.   Assessment/Plan:     1. Delirium-multifactorial, alcohol/opiate withdrawal, ICU delirium.  He also has history of CVA.  Psychiatry was consulted on 05/31/2018, started on Seroquel which has been discontinued.  Started on Zyprexa. CT head on 05/27/2018 was negative for acute intracranial abnormality.  Continue PRN Haldol for agitation, use only if Zyprexa does not help. Ativan 2 mg IV x1 was given and patient calmed down, start Ativan 1 mg IV every 4 hours as needed for anxiety/agitation.  2. Chronic alcoholism/substance abuse-continue thiamine, folic acid. CIWA protocol.  3. Dysphagia/inability to swallow-secondary to ongoing agitation/disorientation.  Pulled out NG tube last night, swallow eval done today, started on full liquid diet.  4. Mechanical fall/rib fractures-multiple rib fractures noticed on admission.  Continue pain management.  5. Acute hypoxic  respiratory failure-resolved,In setting of multiple rib fractures /atelectasis/pneumothorax.  Initially required intubation, self extubated on 1218.  Did not require chest tube.  Continue supplemental oxygen as needed.  Completed course of Levaquin.  6. UTI-patient had abnormal UA on 05/17/2018, was started on Levaquin completed 7 days course of Levaquin.  7. Right layering effusion/hemothorax-history of multiple rib fractures, status post right-sided thoracentesis on 05/27/2018 with removal of 180 cc of bloody tinged fluid.  8. Goals of care-palliative care has been consulted for goals of care for persistent delirium.  Daughter to have discussion with palliative care along with her uncle, patient's brother.     CBG: Recent Labs  Lab 06/03/18 1940 06/03/18 2335 06/04/18 0416 06/04/18 0811 06/04/18 1107  GLUCAP 92 101* 85 95 89    CBC: Recent Labs  Lab 05/29/18 0548 05/30/18 1302 06/02/18 0417  WBC 9.3 9.4 9.5  NEUTROABS 6.5 6.3 6.1  HGB 12.1* 11.4* 12.7*  HCT 36.9* 34.4* 37.6*  MCV 104.8* 102.4* 103.3*  PLT 621* 608* 684*    Basic Metabolic Panel: Recent Labs  Lab 05/29/18 0451 05/30/18 0326 05/30/18 1302 05/31/18 0423 06/01/18 0350 06/02/18 0417  NA 138  --  136  --   --  138  K 3.1*  --  4.6  --   --  3.7  CL 115*  --  109  --   --  106  CO2 16*  --  20*  --   --  22  GLUCOSE 91  --  118*  --   --  106*  BUN 10  --  18  --   --  18  CREATININE 0.39*  --  0.56*  --   --  0.63  CALCIUM 6.6*  --  8.2*  --   --  8.7*  MG 1.5* 2.4  --  1.9 2.0  --   PHOS 3.1  --   --   --   --   --      DVT prophylaxis: Lovenox  Code Status: Full code  Family Communication: No family at bedside  Disposition Plan: Skilled nursing facility   Consultants:  PCCM  Procedures:  Intubation mechanical ventilation   Antibiotics:   Anti-infectives (From admission, onward)   Start     Dose/Rate Route Frequency Ordered Stop   05/24/18 1230  levofloxacin (LEVAQUIN)  tablet 500 mg     500 mg Oral Daily 05/24/18 1100 05/26/18 0959   05/18/18 2000  levofloxacin (LEVAQUIN) IVPB 750 mg     750 mg 100 mL/hr over 90 Minutes Intravenous Every 24 hours 05/18/18 0440 05/22/18 2240   05/18/18 0600  metroNIDAZOLE (FLAGYL) IVPB 500 mg  Status:  Discontinued     500 mg 100 mL/hr over 60 Minutes Intravenous Every 8 hours 05/18/18 0459 05/18/18 1047   05/17/18 2030  levofloxacin (LEVAQUIN) IVPB 500 mg  Status:  Discontinued     500 mg 100 mL/hr over 60 Minutes Intravenous Every 24 hours 05/17/18 1957 05/18/18 0440       Objective   Vitals:   06/03/18 1811 06/03/18 1924 06/04/18 0403 06/04/18 0408  BP: (!) 150/91 (!) 168/79 138/77   Pulse: 87 89 90 88  Resp: 18 18 (!) 21 (!) 22  Temp: 99.6 F (37.6 C) 99.2 F (37.3 C) 99.7 F (37.6 C)   TempSrc: Oral Oral Oral   SpO2: 97% 100% 99% 98%  Weight:    64.5 kg  Height:        Intake/Output Summary (Last 24 hours) at 06/04/2018 1138 Last data filed at 06/04/2018 0755 Gross per 24 hour  Intake 700 ml  Output 400 ml  Net 300 ml   Filed Weights   06/02/18 0500 06/03/18 0625 06/04/18 0408  Weight: 66.6 kg 64.2 kg 64.5 kg     Physical Examination:      General: Appears in no acute distress  Cardiovascular: S1-S2, regular  Respiratory: Clear to auscultation bilaterally  Abdomen: Soft, nontender, no organomegaly  Musculoskeletal: No edema in the lower extremities  Psychiatric-alert, not oriented x2, oriented to self only, lacks insight and judgment.       Data Reviewed: I have personally reviewed following labs and imaging studies   Recent Results (from the past 240 hour(s))  Culture, body fluid-bottle     Status: None   Collection Time: 05/29/18 11:57 AM  Result Value Ref Range Status   Specimen Description PLEURAL RIGHT  Final   Special Requests NONE  Final   Culture   Final    NO GROWTH 5 DAYS Performed at [facility], [address], [place], [place] [address]     Report Status 06/03/2018 FINAL  Final  Gram stain     Status: None   Collection Time: 05/29/18 11:57 AM  Result Value Ref Range Status   Specimen Description PLEURAL RIGHT  Final   Special Requests NONE  Final   Gram Stain   Final    RARE WBC PRESENT,BOTH PMN AND MONONUCLEAR NO ORGANISMS SEEN Performed at [facility], [place] [address] [place], [place] [address]    Report Status 05/29/2018 FINAL  Final     Liver Function Tests: Recent Labs  Lab 05/28/18 1357  PROT 6.1*   No results  for input(s): LIPASE, AMYLASE in the last 168 hours. No results for input(s): AMMONIA in the last 168 hours.  Cardiac Enzymes: No results for input(s): CKTOTAL, CKMB, CKMBINDEX, TROPONINI in the last 168 hours. BNP (last 3 results) No results for input(s): BNP in the last 8760 hours.  ProBNP (last 3 results) No results for input(s): PROBNP in the last 8760 hours.    Studies: No results found.  Scheduled Meds: . aspirin  81 mg Oral Daily  . chlorhexidine  15 mL Mouth Rinse BID  . enoxaparin (LOVENOX) injection  40 mg Subcutaneous Q24H  . feeding supplement (PRO-STAT SUGAR FREE 64)  30 mL Per Tube Daily  . folic acid  1 mg Oral Daily  . free water  100 mL Per Tube Q8H  . lidocaine  2 patch Transdermal Q24H  . mouth rinse  15 mL Mouth Rinse q12n4p  . nicotine  14 mg Transdermal Daily  . OLANZapine  2.5 mg Oral QHS  . polyethylene glycol  17 g Oral Daily    Admission status: Inpatient: Based on patients clinical presentation and evaluation of above clinical data, I have made determination that patient meets Inpatient criteria at this time.  Patient continues to be in delirium.  Will require skilled nursing facility once stable.  Time spent: 20 min  [facility] Pager [phone]. If 7PM-7AM, please contact night-coverage at [email or web address], Office  [phone]  password [email or web address]  06/04/2018, 11:38 AM  LOS: 21 days

## 2018-06-04 NOTE — Progress Notes (Signed)
Pt becoming increasingly agitated. IV Haldol administered with no change in pt's behavior. Pt hitting and kicking staff members. MD made aware. Verbal order received for one time dose of IV ativan. Will administer and continue to monitor.   Ara Kussmaul BSN, RN

## 2018-06-04 NOTE — Consult Note (Addendum)
Neurology Consultation  Reason for Consult: Altered mental status Referring Physician: [place]  CC: Altered mental status  History is obtained from: From chart and baseline history from daughter  HPI: [name] Pace is a 67 y.o. male with history of stroke, prostate cancer, hypercholesterolemia, coronary artery disease, breast cancer, alcoholism.  Patient was initially admitted secondary to having a fall on 05/13/2018 causing right-sided chest pain.  Work-up revealed multiple right-sided rib fractures and a small pneumothorax however he did not require chest tube.  Patient was subsequently placed on the CIWA scale secondary to alcohol withdrawals.  While in the hospital he developed respiratory failure was intubated on 05/18/2018 and started on a Precedex drip.  Since 05/23/2018 1 patient self extubated himself he has been continuously agitated, and confused.  Currently, and unfortunately patient is under the influence of Haldol and Ativan secondary to becoming severely agitated.  I did call his daughter to find out what his baseline was like.  She is slightly estranged from him secondary to his lifestyle of alcoholism and drug abuse.  She states that he uses narcotics and can drink at least 1/5 of alcohol a day and oftentimes drinks until he passes out daily.  She is also noted that he has started to make things up and confabulate.  Other times he seems lucid.   ROS:  Unable to obtain due to altered mental status.   Past Medical History:  Diagnosis Date  . Alcohol abuse   . Anxiety   . Breast cancer ([place]) Left breast   Per patient diagnosed in early 66's.   . Coronary artery disease   . High cholesterol   . Prostate cancer ([place])    per patient diagnosed in early 49's  . Stroke [facility])      Family History  Problem Relation Age of Onset  . Dementia Mother    Social History:   reports that he has been smoking cigarettes. He has a 42.00 pack-year smoking history. He has never used smokeless  tobacco. He reports current alcohol use. He reports that he does not use drugs.  Medications  Current Facility-Administered Medications:  .  0.9 %  sodium chloride infusion, , Intravenous, PRN, Brand Males, MD, Last Rate: 10 mL/hr at 05/26/18 0522, 10 mL/hr at 05/26/18 0522 .  aspirin chewable tablet 81 mg, 81 mg, Oral, Daily, Darrick Meigs, Marge Duncans, MD, 81 mg at 06/04/18 1020 .  chlorhexidine (PERIDEX) 0.12 % solution 15 mL, 15 mL, Mouth Rinse, BID, Adhikari, Amrit, MD, 15 mL at 06/03/18 2140 .  enoxaparin (LOVENOX) injection 40 mg, 40 mg, Subcutaneous, Q24H, Corey Harold, NP, 40 mg at 06/03/18 1000 .  feeding supplement (OSMOLITE 1.5 CAL) liquid 1,000 mL, 1,000 mL, Per Tube, Continuous, Ramaswamy, Murali, MD, Last Rate: 60 mL/hr at 06/02/18 0655, 1,000 mL at 06/02/18 0655 .  feeding supplement (PRO-STAT SUGAR FREE 64) liquid 30 mL, 30 mL, Per Tube, Daily, Chase Caller, Murali, MD, 30 mL at 06/03/18 1530 .  fentaNYL (SUBLIMAZE) injection 25-100 mcg, 25-100 mcg, Intravenous, Q2H PRN, Brand Males, MD, 50 mcg at 06/03/18 1757 .  folic acid (FOLVITE) tablet 1 mg, 1 mg, Oral, Daily, Darrick Meigs, Gagan S, MD, 1 mg at 06/04/18 1020 .  haloperidol lactate (HALDOL) injection 2 mg, 2 mg, Intravenous, Q6H PRN, Corey Harold, NP, 2 mg at 06/04/18 1037 .  ibuprofen (ADVIL,MOTRIN) 100 MG/5ML suspension 400 mg, 400 mg, Oral, Q6H PRN, Adhikari, Amrit, MD .  ipratropium-albuterol (DUONEB) 0.5-2.5 (3) MG/3ML nebulizer solution 3 mL, 3  mL, Nebulization, Q6H PRN, Rai, Ripudeep K, MD, 3 mL at 05/31/18 0437 .  lidocaine (LIDODERM) 5 % 2 patch, 2 patch, Transdermal, Q24H, Agarwala, Ravi, MD, 2 patch at 06/03/18 1300 .  MEDLINE mouth rinse, 15 mL, Mouth Rinse, q12n4p, Adhikari, Amrit, MD, 15 mL at 06/03/18 1600 .  nicotine (NICODERM CQ - dosed in mg/24 hours) patch 14 mg, 14 mg, Transdermal, Daily, Ramaswamy, Murali, MD, 14 mg at 06/04/18 1022 .  OLANZapine (ZYPREXA) tablet 2.5 mg, 2.5 mg, Oral, QHS, Adhikari, Amrit, MD,  2.5 mg at 06/03/18 2140 .  OLANZapine (ZYPREXA) tablet 2.5 mg, 2.5 mg, Oral, Daily PRN, Shelly Coss, MD, 2.5 mg at 06/01/18 0111 .  ondansetron (ZOFRAN) tablet 4 mg, 4 mg, Oral, Q6H PRN **OR** ondansetron (ZOFRAN) injection 4 mg, 4 mg, Intravenous, Q6H PRN, Rai, Ripudeep K, MD, 4 mg at 06/03/18 1752 .  polyethylene glycol (MIRALAX / GLYCOLAX) packet 17 g, 17 g, Oral, Daily, [place], Gagan S, MD .  sodium chloride flush (NS) 0.9 % injection 10-40 mL, 10-40 mL, Intracatheter, PRN, Rush Farmer, MD, 10 mL at 06/02/18 2253 .  thiamine 500mg  in normal saline (20ml) IVPB, 500 mg, Intravenous, TID, Marliss Coots, PA-C   Exam: Current vital signs: BP 138/77 (BP Location: Left Leg)   Pulse 88   Temp 99.7 F (37.6 C) (Oral)   Resp (!) 22   Ht 6\' 2"  (1.88 m)   Wt 64.5 kg   SpO2 98%   BMI 18.26 kg/m  Vital signs in last 24 hours: Temp:  [98 F (36.7 C)-99.7 F (37.6 C)] 99.7 F (37.6 C) (12/30 0403) Pulse Rate:  [87-97] 88 (12/30 0408) Resp:  [18-22] 22 (12/30 0408) BP: (95-168)/(74-91) 138/77 (12/30 0403) SpO2:  [97 %-100 %] 98 % (12/30 0408) Weight:  [64.5 kg] 64.5 kg (12/30 0408)  Physical Exam  Constitutional: Appears well-developed and well-nourished.  Psych: Currently severely sedated secondary to agitation Eyes: No scleral injection HENT: No OP obstrucion Head: Normocephalic.  Cardiovascular: Normal rate and regular rhythm.  Respiratory: Effort normal, non-labored breathing GI: Soft.  No distension. There is no tenderness.  Skin: WDI  Neuro: Mental Status: As patient is sedated I could not get significant amount of exam.  He was able to state his name.  He did not follow many commands for me however when my attending Dr. Leonel Ramsay entered the room apparently he was able to follow some simple commands. Cranial Nerves: II: Patient did blink to threat III,IV, VI: EOMI without ptosis or diploplia.  Pupils are equal round react light and accommodating V: Winces to noxious  stimuli VII: Facial movement is symmetric.  VIII: hearing is intact to voice .  Motor: Withdrawing from pain all extremities antigravity Sensory: Withdrew from noxious stimuli Deep Tendon Reflexes: 1+ ankle jerk, 2+ knee jerk with cross adduction, 2+ bicep and brachioradialis reflexes Plantars: Going on the right downgoing on the left Cerebellar: Unable to test     Labs I have reviewed labs in epic and the results pertinent to this consultation are:   CBC    Component Value Date/Time   WBC 9.5 06/02/2018 0417   RBC 3.64 (L) 06/02/2018 0417   HGB 12.7 (L) 06/02/2018 0417   HCT 37.6 (L) 06/02/2018 0417   PLT 684 (H) 06/02/2018 0417   MCV 103.3 (H) 06/02/2018 0417   MCH 34.9 (H) 06/02/2018 0417   MCHC 33.8 06/02/2018 0417   RDW 12.5 06/02/2018 0417   LYMPHSABS 1.5 06/02/2018 0417   MONOABS  1.1 (H) 06/02/2018 0417   EOSABS 0.6 (H) 06/02/2018 0417   BASOSABS 0.1 06/02/2018 0417    CMP     Component Value Date/Time   NA 138 06/02/2018 0417   K 3.7 06/02/2018 0417   CL 106 06/02/2018 0417   CO2 22 06/02/2018 0417   GLUCOSE 106 (H) 06/02/2018 0417   BUN 18 06/02/2018 0417   CREATININE 0.63 06/02/2018 0417   CALCIUM 8.7 (L) 06/02/2018 0417   PROT 6.1 (L) 05/28/2018 1357   ALBUMIN 1.6 (L) 05/23/2018 0340   AST 36 05/22/2018 0502   ALT 15 05/22/2018 0502   ALKPHOS 45 05/22/2018 0502   BILITOT 1.0 05/22/2018 0502   GFRNONAA >60 06/02/2018 0417   GFRAA >60 06/02/2018 0417   Thiamine level pending   Lipid Panel  No results found for: CHOL, TRIG, HDL, CHOLHDL, VLDL, LDLCALC, LDLDIRECT   Imaging I have reviewed the images obtained:  CT-scan of the brain-no acute intracranial process.  Old right basal ganglia infarct.  Moderate chronic small vessel disease.  Mild parenchymal brain volume loss, severe vermian atrophy  MRI examination of the brain--pending  Etta Quill PA-C [facility] [phone]  M-F  (9:00 am- 5:00 PM)  06/04/2018, 1:30 PM    I have seen the patient reviewed the above note.  On my exam, he does follow commands, but is still sedated from earlier.  The description that his caregivers provide is most consistent with confabulation..  Assessment:  67 year old gentleman with alcohol abuse who presented to the hospital secondary to fall.  Given the description of confabulation, the possibility of Korsakoff syndrome is significant.  Delirium tremens can be prolonged in some patients, but I would favor high-dose thiamine following checking thiamine.   Recommendations: -Continue CIWA scale - High-dose thiamine 500 mg 3 times daily for 3 days then 100 mg daily -MRI brain when possible -TSH, B12, B1 level  Roland Rack, MD [facility] [phone]  If 7pm- 7am, please page neurology on call as listed in [place].

## 2018-06-04 NOTE — Progress Notes (Addendum)
CSW spoke with patient's daughter, she agreed that we could send her father to [facility]. CSW updated her that [facility] was unable to take him at this time. She did have some concerns and wanted to speak with the doctor about her fathers medical condition. CSW told the patients daughter that she would message the doctor and have him contact her as soon as possible.   CSW messaged MD asking if he could contact the patients daughter and provided contact information.   CSW verified with [facility] that they are able to accept the patient and has started insurance authorization.   Will continue to follow.   Domenic Schwab, MSW, [place]

## 2018-06-04 NOTE — Care Management Important Message (Signed)
Important Message  Patient Details  Name: [name] Pace MRN: [id number] Date of Birth: 1950-07-20   Medicare Important Message Given:  Yes    Jacqualine Weichel P Betha Shadix 06/04/2018, 3:25 PM

## 2018-06-05 LAB — GLUCOSE, CAPILLARY
GLUCOSE-CAPILLARY: 105 mg/dL — AB (ref 70–99)
Glucose-Capillary: 109 mg/dL — ABNORMAL HIGH (ref 70–99)
Glucose-Capillary: 111 mg/dL — ABNORMAL HIGH (ref 70–99)
Glucose-Capillary: 118 mg/dL — ABNORMAL HIGH (ref 70–99)
Glucose-Capillary: 70 mg/dL (ref 70–99)
Glucose-Capillary: 89 mg/dL (ref 70–99)
Glucose-Capillary: 90 mg/dL (ref 70–99)

## 2018-06-05 LAB — VITAMIN B12: Vitamin B-12: 377 pg/mL (ref 180–914)

## 2018-06-05 MED ORDER — SODIUM CHLORIDE 0.9 % IV BOLUS
500.0000 mL | Freq: Once | INTRAVENOUS | Status: AC
  Administered 2018-06-05: 500 mL via INTRAVENOUS

## 2018-06-05 NOTE — Progress Notes (Signed)
Patient is still receiving ativan and has a Actuary. Once sitter has been dismissed and has received insurance authorization then the patient can be discharged to [place].   CSW will continue to follow.   Domenic Schwab, MSW, [place]

## 2018-06-05 NOTE — Progress Notes (Signed)
Physical Therapy Treatment Patient Details Name: [name] Pace MRN: [id number] DOB: [birth year] Today's Date: 06/05/2018    History of Present Illness Patient is a 67 year old male with history of alcohol abuse, CAD, history of CVA presented to ED with severe right-sided chest wall pain after a fall on the night of 12/7. Pt with R rib fxs 5-11. Intubated 12/13, self extubated 12/18.     PT Comments    Pt much more alert and cooperative today. If can maintain this he should begin to show more progress with mobility.    Follow Up Recommendations  SNF;Supervision/Assistance - 24 hour     Equipment Recommendations  Other (comment)(TBD)    Recommendations for Other Services       Precautions / Restrictions Precautions Precautions: Fall    Mobility  Bed Mobility Overal bed mobility: Needs Assistance Bed Mobility: Supine to Sit;Sit to Supine     Supine to sit: Min assist Sit to supine: Min assist   General bed mobility comments: Assist to elevate trunk into sitting. Assist to bring legs back up into bed returning to supine.  Transfers Overall transfer level: Needs assistance Equipment used: 2 person hand held assist Transfers: Sit to/from Stand Sit to Stand: +2 physical assistance;Mod assist         General transfer comment: Assist to bring hips up and for balance. Pt grasping forearms or PT and tech with support under his bil elbows. Verbal cues to extend hips and trunk.   Ambulation/Gait Ambulation/Gait assistance: +2 physical assistance;Mod assist Gait Distance (Feet): 2 Feet Assistive device: 2 person hand held assist Gait Pattern/deviations: Ataxic;Step-to pattern;Decreased step length - right;Decreased step length - left;Shuffle;Trunk flexed Gait velocity: decr Gait velocity interpretation: <1.8 ft/sec, indicate of risk for recurrent falls General Gait Details: Pt with short shuffling steps up side of bed. Pt very ataxic and required assist for balance and  support.    Stairs             Wheelchair Mobility    Modified Rankin (Stroke Patients Only)       Balance Overall balance assessment: Needs assistance;History of Falls Sitting-balance support: Feet supported;No upper extremity supported Sitting balance-Leahy Scale: Good Sitting balance - Comments: Sat EOB x 15-20 minutes with supervision for safety   Standing balance support: Bilateral upper extremity supported Standing balance-Leahy Scale: Poor Standing balance comment: +2 min to mod assist for static standing                            Cognition Arousal/Alertness: Awake/alert Behavior During Therapy: WFL for tasks assessed/performed Overall Cognitive Status: Impaired/Different from baseline Area of Impairment: Safety/judgement;Following commands;Attention;Memory;Problem solving                 Orientation Level: Disoriented to;Place;Time;Situation Current Attention Level: Sustained Memory: Decreased short-term memory Following Commands: Follows one step commands consistently(pt not following commands) Safety/Judgement: Decreased awareness of safety;Decreased awareness of deficits   Problem Solving: Requires verbal cues;Requires tactile cues General Comments: Pt much more alert today. Cooperative throughout sesssion and required redirection to stay on task      Exercises      General Comments General comments (skin integrity, edema, etc.): VSS      Pertinent Vitals/Pain Pain Assessment: No/denies pain    Home Living                      Prior Function  PT Goals (current goals can now be found in the care plan section) Acute Rehab PT Goals PT Goal Formulation: Patient unable to participate in goal setting Time For Goal Achievement: 06/19/18 Potential to Achieve Goals: Fair Progress towards PT goals: Goals downgraded-see care plan    Frequency    Min 2X/week      PT Plan Current plan remains appropriate     Co-evaluation PT/OT/SLP Co-Evaluation/Treatment: Yes            AM-PAC PT "6 Clicks" Mobility   Outcome Measure  Help needed turning from your back to your side while in a flat bed without using bedrails?: A Little Help needed moving from lying on your back to sitting on the side of a flat bed without using bedrails?: A Little Help needed moving to and from a bed to a chair (including a wheelchair)?: A Lot Help needed standing up from a chair using your arms (e.g., wheelchair or bedside chair)?: A Lot Help needed to walk in hospital room?: Total Help needed climbing 3-5 steps with a railing? : Total 6 Click Score: 12    End of Session Equipment Utilized During Treatment: Gait belt Activity Tolerance: Patient tolerated treatment well Patient left: in bed;with call bell/phone within reach;with nursing/sitter in room;with bed alarm set(sittter) Nurse Communication: Mobility status PT Visit Diagnosis: Unsteadiness on feet (R26.81);Repeated falls (R29.6);Difficulty in walking, not elsewhere classified (R26.2);Muscle weakness (generalized) (M62.81)     Time: 7672-0947 PT Time Calculation (min) (ACUTE ONLY): 25 min  Charges:  $Therapeutic Activity: 23-37 mins                     [place] Pager [phone] Office [place] 06/05/2018, 11:38 AM

## 2018-06-05 NOTE — Progress Notes (Signed)
  Speech Language Pathology Treatment: Dysphagia  Patient Details Name: NIAM NEPOMUCENO MRN: [id number] DOB: Jun 06, 1951 Today's Date: 06/05/2018 Time: 9937-1696 SLP Time Calculation (min) (ACUTE ONLY): 20 min  Assessment / Plan / Recommendation Clinical Impression  Pt's mentation is continuing to improve; not oriented to place/time/situation however calm, attending to speaker, responding and following directions. Daughter is at bedside and is encouraged with his progress. Apparently pt has not had dentures for awhile and gums are hard and able to masticate graham cracker, saltine with peanut butter without difficulty. Multiple trials thin water via straw with one delayed cough. He has difficulty not verbalizing while masticating or immediately after swallow. Recommend full supervision with Dys 3 texture (mech soft), thin liquids, straws allowed and pills whole in applesauce and continue to treat.    HPI HPI: Patient is a 67 year old male with history of alcohol abuse, CAD, history of CVA presented to ED with severe right-sided chest wall pain after a fall on the night of 12/7. Pt with R rib fxs 5-11. Intubated 12/13, self extubated 12/18.  NPO, pulled cortrak 12/28.       SLP Plan  Continue with current plan of care       Recommendations  Diet recommendations: Dysphagia 3 (mechanical soft);Thin liquid Liquids provided via: Cup;Straw Medication Administration: Whole meds with puree Supervision: Patient able to self feed;Full supervision/cueing for compensatory strategies Compensations: Minimize environmental distractions;Slow rate;Small sips/bites Postural Changes and/or Swallow Maneuvers: Seated upright 90 degrees                Oral Care Recommendations: Oral care BID Follow up Recommendations: 24 hour supervision/assistance;Skilled Nursing facility SLP Visit Diagnosis: Dysphagia, unspecified (R13.10) Plan: Continue with current plan of care       GO                 Houston Siren 06/05/2018, 3:02 PM  Orbie Pyo Colvin Caroli.Ed Risk analyst [phone] Office [phone]

## 2018-06-05 NOTE — Progress Notes (Signed)
[facility]  PROGRESS NOTE  TAY WHITWELL IWL:[id number] DOB: 1950/11/03 DOA: 05/13/2018 PCP: System, Provider Not In   Brief HPI:   67 year old male with a history of CVA, prostate cancer, alcohol abuse, substance abuse, hyperlipidemia was admitted on 05/13/2018 after a fall with right-sided chest pain.  Work-up revealed multiple right-sided rib fractures and small pneumothorax which did not require chest tube.  Patient developed acute respiratory failure and also alcohol withdrawal was intubated on 05/18/2018 started on Precedex drip, Levophed.  Patient underwent right-sided thoracentesis with removal of 450 mL of clear fluid on 05/23/2018.  Patient self extubated on 05/23/18.  Since that time patient continues to be agitated, confused.  Started on NG tube feeding due to persistent inability to take by mouth. TRH resumed care on 05/30/2018. Palliative care consulted.    Subjective   Patient seen and examined, seen by neurology yesterday.  Appreciate neurology consult.  This morning patient is somnolent but arousable.  Responding to questions.  Still pleasantly confused.   Assessment/Plan:     1. Delirium-multifactorial, alcohol/opiate withdrawal, ICU delirium.  He also has history of CVA.  Psychiatry was consulted on 05/31/2018, started on Seroquel which has been discontinued.  Started on Zyprexa. CT head on 05/27/2018 was negative for acute intracranial abnormality.  Continue PRN Haldol for agitation, use only if Zyprexa does not help. Ativan 2 mg IV x1 was given and patient calmed down, start Ativan 1 mg IV every 4 hours as needed for anxiety/agitation.  MRI brain done yesterday showed no acute infarct, no other intracranial abnormality.  2. Chronic alcoholism/substance abuse-continue thiamine, folic acid. CIWA protocol.  High-dose thiamine ordered per neurology 500 mg IV 3 times daily for 3 days then 100 mg daily.  B1 level has been sent.  3. Dysphagia/inability to swallow-secondary  to ongoing agitation/disorientation.  Pulled out NG tube last night, swallow eval done today, started on full liquid diet.  4. Mechanical fall/rib fractures-multiple rib fractures noticed on admission.  Continue pain management.  5. Acute hypoxic respiratory failure-resolved,In setting of multiple rib fractures /atelectasis/pneumothorax.  Initially required intubation, self extubated on 1218.  Did not require chest tube.  Continue supplemental oxygen as needed.  Completed course of Levaquin.  6. UTI-patient had abnormal UA on 05/17/2018, was started on Levaquin completed 7 days course of Levaquin.  7. Right layering effusion/hemothorax-history of multiple rib fractures, status post right-sided thoracentesis on 05/27/2018 with removal of 180 cc of bloody tinged fluid.  8. Goals of care-palliative care has been consulted for goals of care for persistent delirium.  Daughter to have discussion with palliative care along with her uncle, patient's brother.     CBG: Recent Labs  Lab 06/04/18 2006 06/05/18 0016 06/05/18 0515 06/05/18 0813 06/05/18 1135  GLUCAP 123* 89 70 111* 90    CBC: Recent Labs  Lab 05/30/18 1302 06/02/18 0417  WBC 9.4 9.5  NEUTROABS 6.3 6.1  HGB 11.4* 12.7*  HCT 34.4* 37.6*  MCV 102.4* 103.3*  PLT 608* 684*    Basic Metabolic Panel: Recent Labs  Lab 05/30/18 0326 05/30/18 1302 05/31/18 0423 06/01/18 0350 06/02/18 0417  NA  --  136  --   --  138  K  --  4.6  --   --  3.7  CL  --  109  --   --  106  CO2  --  20*  --   --  22  GLUCOSE  --  118*  --   --  106*  BUN  --  18  --   --  18  CREATININE  --  0.56*  --   --  0.63  CALCIUM  --  8.2*  --   --  8.7*  MG 2.4  --  1.9 2.0  --      DVT prophylaxis: Lovenox  Code Status: Full code  Family Communication: No family at bedside  Disposition Plan: Skilled nursing facility   Consultants:  PCCM  Procedures:  Intubation mechanical ventilation   Antibiotics:   Anti-infectives (From  admission, onward)   Start     Dose/Rate Route Frequency Ordered Stop   05/24/18 1230  levofloxacin (LEVAQUIN) tablet 500 mg     500 mg Oral Daily 05/24/18 1100 05/26/18 0959   05/18/18 2000  levofloxacin (LEVAQUIN) IVPB 750 mg     750 mg 100 mL/hr over 90 Minutes Intravenous Every 24 hours 05/18/18 0440 05/22/18 2240   05/18/18 0600  metroNIDAZOLE (FLAGYL) IVPB 500 mg  Status:  Discontinued     500 mg 100 mL/hr over 60 Minutes Intravenous Every 8 hours 05/18/18 0459 05/18/18 1047   05/17/18 2030  levofloxacin (LEVAQUIN) IVPB 500 mg  Status:  Discontinued     500 mg 100 mL/hr over 60 Minutes Intravenous Every 24 hours 05/17/18 1957 05/18/18 0440       Objective   Vitals:   06/05/18 0455 06/05/18 0500 06/05/18 1033 06/05/18 1500  BP: 101/89   91/69  Pulse: 79   77  Resp: (!) 21   17  Temp: 97.8 F (36.6 C)  98.1 F (36.7 C)   TempSrc: Oral  Oral   SpO2: 98%   95%  Weight:  64.6 kg    Height:        Intake/Output Summary (Last 24 hours) at 06/05/2018 1536 Last data filed at 06/05/2018 1500 Gross per 24 hour  Intake 820 ml  Output 525 ml  Net 295 ml   Filed Weights   06/03/18 0625 06/04/18 0408 06/05/18 0500  Weight: 64.2 kg 64.5 kg 64.6 kg     Physical Examination:     General: Somnolent but arousable  Cardiovascular: S1-S2, regular, no murmur auscultated  Respiratory: Clear to auscultation bilaterally, no wheezing or crackles.  Abdomen: Soft, nontender, no organomegaly  Musculoskeletal: No edema noted in the lower extremities.  Psychiatric-somnolent, arousable, oriented to self only.        Data Reviewed: I have personally reviewed following labs and imaging studies   Recent Results (from the past 240 hour(s))  Culture, body fluid-bottle     Status: None   Collection Time: 05/29/18 11:57 AM  Result Value Ref Range Status   Specimen Description PLEURAL RIGHT  Final   Special Requests NONE  Final   Culture   Final    NO GROWTH 5  DAYS Performed at [facility], [address] [place], [place] [address]    Report Status 06/03/2018 FINAL  Final  Gram stain     Status: None   Collection Time: 05/29/18 11:57 AM  Result Value Ref Range Status   Specimen Description PLEURAL RIGHT  Final   Special Requests NONE  Final   Gram Stain   Final    RARE WBC PRESENT,BOTH PMN AND MONONUCLEAR NO ORGANISMS SEEN Performed at [facility], [place] [address], [place], [place] [address]    Report Status 05/29/2018 FINAL  Final     Liver Function Tests: No results for input(s): AST, ALT, ALKPHOS, BILITOT, PROT, ALBUMIN in the  last 168 hours. No results for input(s): LIPASE, AMYLASE in the last 168 hours. Recent Labs  Lab 06/04/18 1224  AMMONIA 34       Studies: Mr Brain Wo Contrast  Result Date: 06/04/2018 CLINICAL DATA:  67 year old male with altered mental status, combative. EXAM: MRI HEAD WITHOUT CONTRAST TECHNIQUE: Multiplanar, multiecho pulse sequences of the brain and surrounding structures were obtained without intravenous contrast. COMPARISON:  Head CT 05/30/2018 and earlier. FINDINGS: Study is intermittently degraded by motion artifact despite repeated imaging attempts, and the examination had to be discontinued prior to completion due to patient agitation. Four sets of diffusion-weighted imaging were obtained, but otherwise only motion degraded axial T2 and FLAIR imaging could be obtained. Brain: No restricted diffusion or evidence of acute infarction. Chronic lacunar infarcts of the right basal ganglia and thalamus. Moderate nonspecific periventricular white matter T2 and FLAIR hyperintensity appears confluent. No midline shift, mass effect, evidence of mass lesion, ventriculomegaly, extra-axial collection or acute intracranial hemorrhage. Vascular: Major intracranial vascular flow voids are preserved at the skull base. Skull and upper cervical spine: Not evaluated. Sinuses/Orbits: Negative. IMPRESSION: 1.  Motion degraded study which had to be discontinued prior to completion due to patient agitation. 2. Negative for acute infarct. No acute intracranial abnormality is evident. 3. Chronic small vessel ischemia in the right deep gray matter nuclei. Electronically Signed   By: Genevie Ann M.D.   On: 06/04/2018 15:37    Scheduled Meds: . aspirin  81 mg Oral Daily  . chlorhexidine  15 mL Mouth Rinse BID  . enoxaparin (LOVENOX) injection  40 mg Subcutaneous Q24H  . feeding supplement (PRO-STAT SUGAR FREE 64)  30 mL Per Tube Daily  . folic acid  1 mg Oral Daily  . lidocaine  2 patch Transdermal Q24H  . mouth rinse  15 mL Mouth Rinse q12n4p  . nicotine  14 mg Transdermal Daily  . OLANZapine  2.5 mg Oral QHS  . polyethylene glycol  17 g Oral Daily    Admission status: Inpatient: Based on patients clinical presentation and evaluation of above clinical data, I have made determination that patient meets Inpatient criteria at this time.  Patient continues to be in delirium.  Will require skilled nursing facility once stable.  Time spent: 20 min  [facility] Pager [phone]. If 7PM-7AM, please contact night-coverage at [email or web address], Office  [phone]  password [email or web address]  06/05/2018, 3:36 PM  LOS: 22 days

## 2018-06-06 LAB — GLUCOSE, CAPILLARY
Glucose-Capillary: 100 mg/dL — ABNORMAL HIGH (ref 70–99)
Glucose-Capillary: 83 mg/dL (ref 70–99)
Glucose-Capillary: 90 mg/dL (ref 70–99)
Glucose-Capillary: 97 mg/dL (ref 70–99)
Glucose-Capillary: 91 mg/dL (ref 70–99)

## 2018-06-06 LAB — VITAMIN B1: VITAMIN B1 (THIAMINE): 165 nmol/L (ref 66.5–200.0)

## 2018-06-06 MED ORDER — SENNOSIDES-DOCUSATE SODIUM 8.6-50 MG PO TABS: 2.0000 | ORAL_TABLET | Freq: Every evening | ORAL | Status: DC | PRN

## 2018-06-06 MED ORDER — LIDOCAINE 5 % EX PTCH
2.0000 | MEDICATED_PATCH | CUTANEOUS | Status: DC
  Administered 2018-06-06: 1 via TRANSDERMAL
  Administered 2018-06-07: 2 via TRANSDERMAL
  Filled 2018-06-06 (×2): qty 2

## 2018-06-06 MED ORDER — VITAMIN B-12 1000 MCG PO TABS
1000.0000 ug | ORAL_TABLET | Freq: Every day | ORAL | Status: DC
  Administered 2018-06-06 – 2018-06-09 (×4): 1000 ug via ORAL
  Filled 2018-06-06 (×4): qty 1

## 2018-06-06 MED ORDER — OXYCODONE HCL 5 MG PO TABS
5.0000 mg | ORAL_TABLET | Freq: Four times a day (QID) | ORAL | Status: DC | PRN
  Administered 2018-06-07 – 2018-06-09 (×6): 5 mg via ORAL
  Filled 2018-06-06 (×6): qty 1

## 2018-06-06 MED ORDER — POLYETHYLENE GLYCOL 3350 17 G PO PACK: 17.0000 g | PACK | Freq: Every day | ORAL | Status: DC | PRN

## 2018-06-06 NOTE — Progress Notes (Signed)
Neurology Progress Note   S:// Seen and examined.  According to his RN, he has been more conversant and coherent over the past 24 to 48 hours than prior. Complains of tenderness around his hips and chest more on the right.  O:// Current vital signs: BP 100/78 (BP Location: Left Arm)   Pulse 80   Temp 98.4 F (36.9 C) (Oral)   Resp (!) 22   Ht 6\' 2"  (1.88 m)   Wt 65.6 kg   SpO2 94%   BMI 18.57 kg/m  Vital signs in last 24 hours: Temp:  [98.1 F (36.7 C)-98.5 F (36.9 C)] 98.4 F (36.9 C) (01/01 0300) Pulse Rate:  [77-92] 80 (01/01 0300) Resp:  [17-27] 22 (01/01 0300) BP: (78-100)/(61-78) 100/78 (01/01 0300) SpO2:  [94 %-99 %] 94 % (01/01 0300) Weight:  [65.6 kg] 65.6 kg (01/01 0300) General: Appears well-developed, mildly malnourished. HEENT: Normocephalic, atraumatic CVs: S1-S2 heard, regular rate rhythm Chest:: Clear to auscultation, some tenderness on palpation on both sides of the chest. GI: Soft nondistended Skin: Warm dry and intact Extremities: Palpable tenderness on hips Neurological exam Mental status: Patient is awake, alert, oriented to the fact that he is in the hospital in [place], he was able to give me the correct date 06/06/2018, and his name age and date of birth. He was able to follow commands without difficulty.  Naming objects was intact.  Repetition was intact. Cranial nerves: Pupils are equal round reactive to light, extraocular movements intact, face is symmetric, facial sensations intact, auditory acuity is intact, palate elevates symmetrically, shoulder shrug intact, tongue midline. Motor exam: Symmetric 5/5 strength in all 4 Sensory: Intact light touch all over with no extinction Coordination: Coarse tremor present in all 4 extremities.  No asterixis.  Intact finger-nose-finger. Gait testing was deferred at this time. Deep tendon reflexes 1+ at the ankles, 2+ to almost 3+ at the knees bilaterally.  2+ biceps and  brachioradialis.  Medications  Current Facility-Administered Medications:  .  0.9 %  sodium chloride infusion, , Intravenous, PRN, Brand Males, MD, Last Rate: 10 mL/hr at 05/26/18 0522, 10 mL/hr at 05/26/18 0522 .  aspirin chewable tablet 81 mg, 81 mg, Oral, Daily, Oswald Hillock, MD, 81 mg at 06/05/18 1021 .  chlorhexidine (PERIDEX) 0.12 % solution 15 mL, 15 mL, Mouth Rinse, BID, Adhikari, Amrit, MD, 15 mL at 06/05/18 2112 .  enoxaparin (LOVENOX) injection 40 mg, 40 mg, Subcutaneous, Q24H, Corey Harold, NP, 40 mg at 06/05/18 1025 .  feeding supplement (PRO-STAT SUGAR FREE 64) liquid 30 mL, 30 mL, Per Tube, Daily, Ramaswamy, Murali, MD, 30 mL at 06/05/18 1024 .  fentaNYL (SUBLIMAZE) injection 25-100 mcg, 25-100 mcg, Intravenous, Q2H PRN, Brand Males, MD, 50 mcg at 06/06/18 0258 .  folic acid (FOLVITE) tablet 1 mg, 1 mg, Oral, Daily, Darrick Meigs, Gagan S, MD, 1 mg at 06/05/18 1021 .  haloperidol lactate (HALDOL) injection 2 mg, 2 mg, Intravenous, Q6H PRN, Corey Harold, NP, 2 mg at 06/04/18 1037 .  ibuprofen (ADVIL,MOTRIN) 100 MG/5ML suspension 400 mg, 400 mg, Oral, Q6H PRN, Adhikari, Amrit, MD .  ipratropium-albuterol (DUONEB) 0.5-2.5 (3) MG/3ML nebulizer solution 3 mL, 3 mL, Nebulization, Q6H PRN, Rai, Ripudeep K, MD, 3 mL at 05/31/18 0437 .  lidocaine (LIDODERM) 5 % 2 patch, 2 patch, Transdermal, Q24H, Agarwala, Ravi, MD, 1 patch at 06/05/18 1355 .  LORazepam (ATIVAN) injection 1 mg, 1 mg, Intravenous, Q4H PRN, Oswald Hillock, MD, 1 mg at 06/05/18 1131 .  MEDLINE mouth rinse, 15 mL, Mouth Rinse, q12n4p, Adhikari, Amrit, MD, 15 mL at 06/05/18 1621 .  nicotine (NICODERM CQ - dosed in mg/24 hours) patch 14 mg, 14 mg, Transdermal, Daily, Ramaswamy, Murali, MD, 14 mg at 06/05/18 1027 .  OLANZapine (ZYPREXA) tablet 2.5 mg, 2.5 mg, Oral, QHS, Adhikari, Amrit, MD, 2.5 mg at 06/05/18 2112 .  OLANZapine (ZYPREXA) tablet 2.5 mg, 2.5 mg, Oral, Daily PRN, Shelly Coss, MD, 2.5 mg at 06/01/18  0111 .  ondansetron (ZOFRAN) tablet 4 mg, 4 mg, Oral, Q6H PRN **OR** ondansetron (ZOFRAN) injection 4 mg, 4 mg, Intravenous, Q6H PRN, Rai, Ripudeep K, MD, 4 mg at 06/03/18 1752 .  polyethylene glycol (MIRALAX / GLYCOLAX) packet 17 g, 17 g, Oral, Daily, [place], Gagan S, MD .  sodium chloride flush (NS) 0.9 % injection 10-40 mL, 10-40 mL, Intracatheter, PRN, Rush Farmer, MD, 10 mL at 06/02/18 2253 .  thiamine 500mg  in normal saline (50ml) IVPB, 500 mg, Intravenous, TID, Marliss Coots, PA-C, Last Rate: 100 mL/hr at 06/05/18 2113, 500 mg at 06/05/18 2113  Labs CBC    Component Value Date/Time   WBC 9.5 06/02/2018 0417   RBC 3.64 (L) 06/02/2018 0417   HGB 12.7 (L) 06/02/2018 0417   HCT 37.6 (L) 06/02/2018 0417   PLT 684 (H) 06/02/2018 0417   MCV 103.3 (H) 06/02/2018 0417   MCH 34.9 (H) 06/02/2018 0417   MCHC 33.8 06/02/2018 0417   RDW 12.5 06/02/2018 0417   LYMPHSABS 1.5 06/02/2018 0417   MONOABS 1.1 (H) 06/02/2018 0417   EOSABS 0.6 (H) 06/02/2018 0417   BASOSABS 0.1 06/02/2018 0417   CMP     Component Value Date/Time   NA 138 06/02/2018 0417   K 3.7 06/02/2018 0417   CL 106 06/02/2018 0417   CO2 22 06/02/2018 0417   GLUCOSE 106 (H) 06/02/2018 0417   BUN 18 06/02/2018 0417   CREATININE 0.63 06/02/2018 0417   CALCIUM 8.7 (L) 06/02/2018 0417   PROT 6.1 (L) 05/28/2018 1357   ALBUMIN 1.6 (L) 05/23/2018 0340   AST 36 05/22/2018 0502   ALT 15 05/22/2018 0502   ALKPHOS 45 05/22/2018 0502   BILITOT 1.0 05/22/2018 0502   GFRNONAA >60 06/02/2018 0417   GFRAA >60 06/02/2018 0417  Thiamine 165, B12 377, TSH 1.260   Imaging I have reviewed images in epic and the results pertinent to this consultation are: MRI examination of the brain -no acute infarct.  Study had to be stopped due to patient cooperation and was incomplete.  Assessment:  68 year old man with history of extensive alcohol abuse presented to the hospital 3 weeks ago secondary to a fall.  He started confabulating and  neurology was consulted for continuing altered mental status.  He is probably at this time improved after time and was supplemented 2 days ago and continues to make improvements. Likely Wernicke's encephalopathy versus prolonged delirium tremens.  Due to his current exam and mental status, I do not think this is Korsakoff psychosis.  Impression: Wernicke's encephalopathy Question underlying possible cognitive impairment-needs outpatient work-up  Recommendations: -Continue to replace thiamine high-dose as recommended before. -Although his B12 level is 377-normal range, it is low normal  -I would recommend aggressive replacement. -Check RPR -Management of pain per primary team.  I would recommend avoiding standing doses of sedating narcotics but PRN medications will be used diligently. -Outpatient neurology follow-up for possible cognitive impairment/dementia -Neurology service will be available as needed.  -- Amie Portland, MD [facility] Pager:  [phone] If 7pm to 7am, please call on call as listed on AMION.

## 2018-06-06 NOTE — Progress Notes (Signed)
Pt BP 79/54 verified with manual bp paged md who ordered bolus 500 cc, will continue to monitor

## 2018-06-06 NOTE — Progress Notes (Signed)
PROGRESS NOTE    [name] Pace  DGU:[id number] DOB: May 02, 1951 DOA: 05/13/2018 PCP: System, Provider Not In   Brief Narrative:  68 year old with history of CVA, prostate cancer, alcohol use, substance use, hyperlipidemia came to the hospital after sustaining a fall with right-sided chest pain.  Work-up revealed multiple right-sided rib fracture along with small pneumothorax.  He also developed acute respiratory distress with significant alcohol withdrawal requiring intubation.  He was placed on Precedex drip.  He was also briefly on Levophed.  Patient underwent right-sided thoracentesis with removal of 4 and 50 mL of clear fluid.  He eventually self extubated 05/23/2018.  Since then patient has shown off-and-on confusion.  Also required NG tube feeding.  Seen by speech and swallow therapist who recommended dysphagia 3 diet   Assessment & Plan:   Principal Problem:   Delirium Active Problems:   Hyponatremia   Alcohol abuse   CAD (coronary artery disease)   History of stroke   Multiple rib fractures   Pneumothorax, closed, traumatic, initial encounter   Delirium tremens ([place])   Acute respiratory failure (HCC)   Shock circulatory (HCC)   Malnutrition of moderate degree   Encephalopathy acute   Palliative care encounter  Acute metabolic encephalopathy/delirium, improved Wernicke's encephalopathy - Currently patient is on aggressive repletion of thiamine, last day of IV.  Switch to oral tomorrow - Avoid any sedative medication. -Continue supportive care. - Neurology evaluation performed -Borderline low B12, supplements added -On Zyprexa at bedtime  Chronic alcohol use and substance abuse - Continue thiamine, folic acid.  Currently on alcohol withdrawal protocol  Dysphagia -Possibly from recent intubation.  Speech and swallow therapist recommended dysphagia 3 diet  Mechanical fall leading to right-sided rib fracture - Pain management.  Bowel regimen  Previously goals of care  discussion took place-palliative care met with the family it was decided to continue current care.  Physical therapy-recommend skilled nursing facility, 24-hour supervision required  DVT prophylaxis: Lovenox Code Status: Full code Family Communication: None at bedside Disposition Plan: Maintain inpatient stay for better pain control, hopefully we can discharge him in next 24-48 hours  Consultants:   Neurology  Procedures:   None  Antimicrobials:   Completed course of antibiotic-Levaquin   Subjective: Patient feels a little better this morning besides chest pain along the rib fracture site.  Review of Systems Otherwise negative except as per HPI, including: General: Denies fever, chills, night sweats or unintended weight loss. Resp: Denies cough, wheezing, shortness of breath. Cardiac: Denies  palpitations, orthopnea, paroxysmal nocturnal dyspnea. GI: Denies abdominal pain, nausea, vomiting, diarrhea or constipation GU: Denies dysuria, frequency, hesitancy or incontinence MS: Denies muscle aches, joint pain or swelling Neuro: Denies headache, neurologic deficits (focal weakness, numbness, tingling), abnormal gait Psych: Denies anxiety, depression, SI/HI/AVH Skin: Denies new rashes or lesions ID: Denies sick contacts, exotic exposures, travel  Objective: Vitals:   06/05/18 2045 06/05/18 2330 06/06/18 0300 06/06/18 1000  BP: (!) 78/61 100/73 100/78   Pulse:  77 80 77  Resp:  (!) 24 (!) 22   Temp:   98.4 F (36.9 C) 98.2 F (36.8 C)  TempSrc:   Oral Oral  SpO2:  96% 94% 96%  Weight:   65.6 kg   Height:        Intake/Output Summary (Last 24 hours) at 06/06/2018 1225 Last data filed at 06/06/2018 0300 Gross per 24 hour  Intake 240 ml  Output 400 ml  Net -160 ml   Filed Weights   06/04/18 0408 06/05/18  0500 06/06/18 0300  Weight: 64.5 kg 64.6 kg 65.6 kg    Examination:  General exam: Appears calm and comfortable  Respiratory system: Diminished breath sounds at  the bases Cardiovascular system: S1 & S2 heard, RRR. No JVD, murmurs, rubs, gallops or clicks. No pedal edema. Gastrointestinal system: Abdomen is nondistended, soft and nontender. No organomegaly or masses felt. Normal bowel sounds heard. Central nervous system: Alert and oriented. No focal neurological deficits. Extremities: Symmetric 4 x 5 power. Skin: No rashes, lesions or ulcers Psychiatry: Judgement and insight appear normal. Mood & affect appropriate.     Data Reviewed:   CBC: Recent Labs  Lab 05/30/18 1302 06/02/18 0417  WBC 9.4 9.5  NEUTROABS 6.3 6.1  HGB 11.4* 12.7*  HCT 34.4* 37.6*  MCV 102.4* 103.3*  PLT 608* 921*   Basic Metabolic Panel: Recent Labs  Lab 05/30/18 1302 05/31/18 0423 06/01/18 0350 06/02/18 0417  NA 136  --   --  138  K 4.6  --   --  3.7  CL 109  --   --  106  CO2 20*  --   --  22  GLUCOSE 118*  --   --  106*  BUN 18  --   --  18  CREATININE 0.56*  --   --  0.63  CALCIUM 8.2*  --   --  8.7*  MG  --  1.9 2.0  --    GFR: Estimated Creatinine Clearance: 85.4 mL/min (by C-G formula based on SCr of 0.63 mg/dL). Liver Function Tests: No results for input(s): AST, ALT, ALKPHOS, BILITOT, PROT, ALBUMIN in the last 168 hours. No results for input(s): LIPASE, AMYLASE in the last 168 hours. Recent Labs  Lab 06/04/18 1224  AMMONIA 34   Coagulation Profile: No results for input(s): INR, PROTIME in the last 168 hours. Cardiac Enzymes: No results for input(s): CKTOTAL, CKMB, CKMBINDEX, TROPONINI in the last 168 hours. BNP (last 3 results) No results for input(s): PROBNP in the last 8760 hours. HbA1C: No results for input(s): HGBA1C in the last 72 hours. CBG: Recent Labs  Lab 06/05/18 2009 06/05/18 2355 06/06/18 0441 06/06/18 0750 06/06/18 1138  GLUCAP 118* 105* 100* 83 90   Lipid Profile: No results for input(s): CHOL, HDL, LDLCALC, TRIG, CHOLHDL, LDLDIRECT in the last 72 hours. Thyroid Function Tests: Recent Labs    06/04/18 2223    TSH 1.260   Anemia Panel: Recent Labs    06/04/18 2223  VITAMINB12 377   Sepsis Labs: No results for input(s): PROCALCITON, LATICACIDVEN in the last 168 hours.  Recent Results (from the past 240 hour(s))  Culture, body fluid-bottle     Status: None   Collection Time: 05/29/18 11:57 AM  Result Value Ref Range Status   Specimen Description PLEURAL RIGHT  Final   Special Requests NONE  Final   Culture   Final    NO GROWTH 5 DAYS Performed at [facility], [address], [place], [place] [address]    Report Status 06/03/2018 FINAL  Final  Gram stain     Status: None   Collection Time: 05/29/18 11:57 AM  Result Value Ref Range Status   Specimen Description PLEURAL RIGHT  Final   Special Requests NONE  Final   Gram Stain   Final    RARE WBC PRESENT,BOTH PMN AND MONONUCLEAR NO ORGANISMS SEEN Performed at [facility], [place] [address], [place], [place] [address]    Report Status 05/29/2018 FINAL  Final  Radiology Studies: Mr Brain 16 Contrast  Result Date: 06/04/2018 CLINICAL DATA:  68 year old male with altered mental status, combative. EXAM: MRI HEAD WITHOUT CONTRAST TECHNIQUE: Multiplanar, multiecho pulse sequences of the brain and surrounding structures were obtained without intravenous contrast. COMPARISON:  Head CT 05/30/2018 and earlier. FINDINGS: Study is intermittently degraded by motion artifact despite repeated imaging attempts, and the examination had to be discontinued prior to completion due to patient agitation. Four sets of diffusion-weighted imaging were obtained, but otherwise only motion degraded axial T2 and FLAIR imaging could be obtained. Brain: No restricted diffusion or evidence of acute infarction. Chronic lacunar infarcts of the right basal ganglia and thalamus. Moderate nonspecific periventricular white matter T2 and FLAIR hyperintensity appears confluent. No midline shift, mass effect, evidence of mass lesion, ventriculomegaly,  extra-axial collection or acute intracranial hemorrhage. Vascular: Major intracranial vascular flow voids are preserved at the skull base. Skull and upper cervical spine: Not evaluated. Sinuses/Orbits: Negative. IMPRESSION: 1. Motion degraded study which had to be discontinued prior to completion due to patient agitation. 2. Negative for acute infarct. No acute intracranial abnormality is evident. 3. Chronic small vessel ischemia in the right deep gray matter nuclei. Electronically Signed   By: Genevie Ann M.D.   On: 06/04/2018 15:37        Scheduled Meds: . aspirin  81 mg Oral Daily  . chlorhexidine  15 mL Mouth Rinse BID  . enoxaparin (LOVENOX) injection  40 mg Subcutaneous Q24H  . feeding supplement (PRO-STAT SUGAR FREE 64)  30 mL Per Tube Daily  . folic acid  1 mg Oral Daily  . lidocaine  2 patch Transdermal Q24H  . mouth rinse  15 mL Mouth Rinse q12n4p  . nicotine  14 mg Transdermal Daily  . OLANZapine  2.5 mg Oral QHS  . polyethylene glycol  17 g Oral Daily   Continuous Infusions: . sodium chloride 10 mL/hr (05/26/18 0522)  . thiamine injection 500 mg (06/06/18 0948)     LOS: 23 days   Time spent= 35 mins     Arsenio Loader, MD [facility] Pager [phone]   If 7PM-7AM, please contact night-coverage [email or web address] Password [facility] 06/06/2018, 12:25 PM

## 2018-06-07 LAB — GLUCOSE, CAPILLARY
Glucose-Capillary: 100 mg/dL — ABNORMAL HIGH (ref 70–99)
Glucose-Capillary: 112 mg/dL — ABNORMAL HIGH (ref 70–99)
Glucose-Capillary: 131 mg/dL — ABNORMAL HIGH (ref 70–99)
Glucose-Capillary: 85 mg/dL (ref 70–99)
Glucose-Capillary: 94 mg/dL (ref 70–99)
Glucose-Capillary: 95 mg/dL (ref 70–99)

## 2018-06-07 LAB — BASIC METABOLIC PANEL
Anion gap: 12 (ref 5–15)
BUN: 10 mg/dL (ref 8–23)
CO2: 14 mmol/L — ABNORMAL LOW (ref 22–32)
Calcium: 8.6 mg/dL — ABNORMAL LOW (ref 8.9–10.3)
Chloride: 111 mmol/L (ref 98–111)
Creatinine, Ser: 0.7 mg/dL (ref 0.61–1.24)
GFR calc Af Amer: >60 mL/min (ref >60)
GFR calc non Af Amer: >60 mL/min (ref >60)
GLUCOSE: 82 mg/dL (ref 70–99)
Potassium: 4.9 mmol/L (ref 3.5–5.1)
Sodium: 137 mmol/L (ref 135–145)

## 2018-06-07 LAB — MAGNESIUM: Magnesium: 1.8 mg/dL (ref 1.7–2.4)

## 2018-06-07 MED ORDER — VITAMIN B-1 100 MG PO TABS
100.0000 mg | ORAL_TABLET | Freq: Every day | ORAL | Status: DC
  Administered 2018-06-08 – 2018-06-09 (×2): 100 mg via ORAL
  Filled 2018-06-07 (×2): qty 1

## 2018-06-07 MED ORDER — OLANZAPINE 5 MG PO TABS
5.0000 mg | ORAL_TABLET | Freq: Every day | ORAL | Status: DC | PRN
  Administered 2018-06-08: 5 mg via ORAL
  Filled 2018-06-07 (×3): qty 1

## 2018-06-07 MED ORDER — ENSURE ENLIVE PO LIQD
237.0000 mL | Freq: Two times a day (BID) | ORAL | Status: DC
  Administered 2018-06-07 – 2018-06-09 (×5): 237 mL via ORAL

## 2018-06-07 NOTE — Care Management Important Message (Signed)
Important Message  Patient Details  Name: [name] Pace MRN: [id number] Date of Birth: 1950/07/20   Medicare Important Message Given:  Yes    [name] P [name] 06/07/2018, 4:21 PM

## 2018-06-07 NOTE — Progress Notes (Signed)
Nutrition Follow-up  DOCUMENTATION CODES:   Non-severe (moderate) malnutrition in context of social or environmental circumstances  INTERVENTION:    Ensure Enlive po BID, each supplement provides 350 kcal and 20 grams of protein  NUTRITION DIAGNOSIS:   Moderate Malnutrition related to social / environmental circumstances(EtOH and tobacco abuse) as evidenced by mild fat depletion, mild muscle depletion, ongoing  GOAL:   Patient will meet greater than or equal to 90% of their needs, unmet  MONITOR:   PO intake, Supplement acceptance, Labs, Skin, Weight trends, I & O's  ASSESSMENT:   68 yo male admitted with right-sided rib fractures s/p fall, also wit small pneumothorax on 12/9. On 12/13, pt with decline requiring intubation. PMH includes CAD, stroke, EtOH and tobacco abuse, hyponatremia   12/09 Admit post fall with rib fractures, small PTX 12/09 CIWA protocol with EtOH withdrawal 12/13 Rapid response, transferred to ICU Intubated, TF initiated 12/18 Self-Extubated, TF d/c, NPO 12/20 NPO, Cortrak placed post-pyloric   Pt pulled out his Cortrak tube 12/28;TF discontinued. S/p dysphagia treatment 12/29; advanced to Full Liquids. S/p dysphagia treatment 12/31; advanced to Dys 3-thin liquids.  PO intake is variable at 0-50% per flowsheet records. Would benefit from nutrition supplements. Labs & medications reviewed. CBG's [id number].  Diet Order:   Diet Order            Diet regular Room service appropriate? Yes; Fluid consistency: Thin  Diet effective now             EDUCATION NEEDS:   Not appropriate for education at this time  Skin:  Skin Assessment: Skin Integrity Issues:(no pressure injury noted) Skin Integrity Issues:: Other (Comment) Other: burn marks on feet  Last BM:  12/31  Height:   Ht Readings from Last 1 Encounters:  05/18/18 6\' 2"  (1.88 m)   Weight:   Wt Readings from Last 1 Encounters:  06/06/18 65.6 kg   BMI:  Body mass index is 18.57  kg/m.  Estimated Nutritional Needs:   Kcal:  2100-2300  Protein:  105-120 gm  Fluid:  2.1-2.3 L  Arthur Holms, RD, LDN Pager #: [phone] After-Hours Pager #: [phone]

## 2018-06-07 NOTE — Plan of Care (Signed)

## 2018-06-07 NOTE — Progress Notes (Signed)
Pt refused to get CBG check at 04:00 am. He was no sign of any distress at this time. Will monitor.

## 2018-06-07 NOTE — Progress Notes (Signed)
PROGRESS NOTE    [name] Pace  JJK:[id number] DOB: 05-11-1951 DOA: 05/13/2018 PCP: System, Provider Not In   Brief Narrative:  68 year old with history of CVA, prostate cancer, alcohol use, substance use, hyperlipidemia came to the hospital after sustaining a fall with right-sided chest pain.  Work-up revealed multiple right-sided rib fracture along with small pneumothorax.  He also developed acute respiratory distress with significant alcohol withdrawal requiring intubation.  He was placed on Precedex drip.  He was also briefly on Levophed.  Patient underwent right-sided thoracentesis with removal of 4 and 50 mL of clear fluid.  He eventually self extubated 05/23/2018.  Since then patient has shown off-and-on confusion.  Also required NG tube feeding.  Seen by speech and swallow therapist who recommended dysphagia 3 diet.   Assessment & Plan:   Principal Problem:   Delirium Active Problems:   Hyponatremia   Alcohol abuse   CAD (coronary artery disease)   History of stroke   Multiple rib fractures   Pneumothorax, closed, traumatic, initial encounter   Delirium tremens ([place])   Acute respiratory failure (HCC)   Shock circulatory (HCC)   Malnutrition of moderate degree   Encephalopathy acute   Palliative care encounter  Acute metabolic encephalopathy/delirium, improved Wernicke's encephalopathy - Status post repletion with IV thiamine, continue oral now. - Avoid any sedative medication. -Continue supportive care. - Neurology evaluation performed -Borderline low B12, B12 supplements have been added -We will obtain EKG today, increase Zyprexa to 5 mg at bedtime.  QTC from 12/19 was 445 -MRI of the brain is negative for any acute pathology, shows chronic ischemic changes.  It was motion degraded.  Advised to use delirium protocol.  Patient should get out of bed to chair, open up his rooms blind, advised family to give him magazine or a book to read.  Avoid disturbing the patient  sleep.  Of advised patient's daughter to try and keep his brain as much as stimulated as possible and keep him active.  She understands that this point prolonged hospitalization is worsening his delirium. She shows concerns for her alcohol abuse and also " his roommate".  I have advised.  To obtain his medical power of attorney if possible and also legal guardianship.  Once patient gets discharged, social worker should follow the patient outpatient.  Chronic alcohol use and substance abuse - Continue thiamine, folic acid.  Currently on alcohol withdrawal protocol  Dysphagia -Possibly from recent intubation.  Speech and swallow therapist recommended dysphagia 3 diet  Mechanical fall leading to right-sided rib fracture - Pain management.  Bowel regimen  Previously goals of care discussion took place-palliative care met with the family it was decided to continue current care.  Physical therapy-recommend skilled nursing facility, 24-hour supervision required  DVT prophylaxis: Lovenox Code Status: Full code Family Communication: Had discussion with the patient's daughter [name] over the phone regarding his overall condition Disposition Plan: Once his mentation is better, discharge him to skilled nursing facility.  He needs to be without sitter for at least 24 hours which will start today morning.  Consultants:   Neurology  Procedures:   None  Antimicrobials:   Completed course of antibiotic-Levaquin   Subjective: Patient has some episodes of agitation and confusion overnight.  Review of Systems Otherwise negative except as per HPI, including: General = no fevers, chills, dizziness, malaise, fatigue HEENT/EYES = negative for pain, redness, loss of vision, double vision, blurred vision, loss of hearing, sore throat, hoarseness, dysphagia Cardiovascular= negative for chest pain, palpitation,  murmurs, lower extremity swelling Respiratory/lungs= negative for shortness of breath, cough,  hemoptysis, wheezing, mucus production Gastrointestinal= negative for nausea, vomiting,, abdominal pain, melena, hematemesis Genitourinary= negative for Dysuria, Hematuria, Change in Urinary Frequency MSK = Negative for arthralgia, myalgias, Back Pain, Joint swelling  Neurology= Negative for headache, seizures, numbness, tingling  Psychiatry= patient is slightly agitated and confused Allergy/Immunology= Medication/Food allergy as listed  Skin= Negative for Rash, lesions, ulcers, itching   Objective: Vitals:   06/06/18 1000 06/06/18 2011 06/07/18 0342 06/07/18 0500  BP:  102/61 97/71   Pulse: 77 79 88 81  Resp:  19 18 19   Temp: 98.2 F (36.8 C) 97.9 F (36.6 C) 97.9 F (36.6 C)   TempSrc: Oral Oral Oral   SpO2: 96% 96% 98% 97%  Weight:      Height:        Intake/Output Summary (Last 24 hours) at 06/07/2018 1028 Last data filed at 06/06/2018 2132 Gross per 24 hour  Intake 350 ml  Output 600 ml  Net -250 ml   Filed Weights   06/04/18 0408 06/05/18 0500 06/06/18 0300  Weight: 64.5 kg 64.6 kg 65.6 kg    Examination:  Constitutional: Elderly frail-appearing Eyes: PERRL, lids and conjunctivae normal ENMT: Mucous membranes are moist. Posterior pharynx clear of any exudate or lesions.Normal dentition.  Neck: normal, supple, no masses, no thyromegaly Respiratory: clear to auscultation bilaterally, no wheezing, no crackles. Normal respiratory effort. No accessory muscle use.  Cardiovascular: Regular rate and rhythm, no murmurs / rubs / gallops. No extremity edema. 2+ pedal pulses. No carotid bruits.  Abdomen: no tenderness, no masses palpated. No hepatosplenomegaly. Bowel sounds positive.  Musculoskeletal: Appears to have good range of motion of the extremities.  No obvious evidence of contracture  Neurologic: CN 2-12 grossly intact. Sensation intact, DTR normal. Strength 4/5 in all 4.  Psychiatric: Patient is slightly agitated but overall easily directable.  He is awake alert  oriented X2.  Off-and-on talks about random things but overall follows commands   Data Reviewed:   CBC: Recent Labs  Lab 06/02/18 0417  WBC 9.5  NEUTROABS 6.1  HGB 12.7*  HCT 37.6*  MCV 103.3*  PLT 778*   Basic Metabolic Panel: Recent Labs  Lab 06/01/18 0350 06/02/18 0417 06/07/18 0546 06/07/18 0900  NA  --  138 137  --   K  --  3.7 4.9  --   CL  --  106 111  --   CO2  --  22 14*  --   GLUCOSE  --  106* 82  --   BUN  --  18 10  --   CREATININE  --  0.63 0.70  --   CALCIUM  --  8.7* 8.6*  --   MG 2.0  --   --  1.8   GFR: Estimated Creatinine Clearance: 85.4 mL/min (by C-G formula based on SCr of 0.7 mg/dL). Liver Function Tests: No results for input(s): AST, ALT, ALKPHOS, BILITOT, PROT, ALBUMIN in the last 168 hours. No results for input(s): LIPASE, AMYLASE in the last 168 hours. Recent Labs  Lab 06/04/18 1224  AMMONIA 34   Coagulation Profile: No results for input(s): INR, PROTIME in the last 168 hours. Cardiac Enzymes: No results for input(s): CKTOTAL, CKMB, CKMBINDEX, TROPONINI in the last 168 hours. BNP (last 3 results) No results for input(s): PROBNP in the last 8760 hours. HbA1C: No results for input(s): HGBA1C in the last 72 hours. CBG: Recent Labs  Lab 06/06/18 1621 06/06/18 2003  06/07/18 0004 06/07/18 0756 06/07/18 0912  GLUCAP 97 91 100* 85 94   Lipid Profile: No results for input(s): CHOL, HDL, LDLCALC, TRIG, CHOLHDL, LDLDIRECT in the last 72 hours. Thyroid Function Tests: Recent Labs    06/04/18 2223  TSH 1.260   Anemia Panel: Recent Labs    06/04/18 2223  VITAMINB12 377   Sepsis Labs: No results for input(s): PROCALCITON, LATICACIDVEN in the last 168 hours.  Recent Results (from the past 240 hour(s))  Culture, body fluid-bottle     Status: None   Collection Time: 05/29/18 11:57 AM  Result Value Ref Range Status   Specimen Description PLEURAL RIGHT  Final   Special Requests NONE  Final   Culture   Final    NO GROWTH 5  DAYS Performed at [facility], [address] [place], [place] [address]    Report Status 06/03/2018 FINAL  Final  Gram stain     Status: None   Collection Time: 05/29/18 11:57 AM  Result Value Ref Range Status   Specimen Description PLEURAL RIGHT  Final   Special Requests NONE  Final   Gram Stain   Final    RARE WBC PRESENT,BOTH PMN AND MONONUCLEAR NO ORGANISMS SEEN Performed at [facility], [place] [address] [place], [place] [address]    Report Status 05/29/2018 FINAL  Final         Radiology Studies: No results found.      Scheduled Meds: . aspirin  81 mg Oral Daily  . chlorhexidine  15 mL Mouth Rinse BID  . enoxaparin (LOVENOX) injection  40 mg Subcutaneous Q24H  . feeding supplement (PRO-STAT SUGAR FREE 64)  30 mL Per Tube Daily  . folic acid  1 mg Oral Daily  . lidocaine  2 patch Transdermal Q24H  . lidocaine  2 patch Transdermal Q24H  . mouth rinse  15 mL Mouth Rinse q12n4p  . nicotine  14 mg Transdermal Daily  . OLANZapine  2.5 mg Oral QHS  . polyethylene glycol  17 g Oral Daily  . vitamin B-12  1,000 mcg Oral Daily   Continuous Infusions: . sodium chloride 10 mL/hr (05/26/18 0522)  . thiamine injection 500 mg (06/06/18 2132)     LOS: 24 days   Time spent= 35 mins     Arsenio Loader, MD [facility] Pager [phone]   If 7PM-7AM, please contact night-coverage [email or web address] Password [facility] 06/07/2018, 10:28 AM

## 2018-06-07 NOTE — Progress Notes (Signed)
Pt's daughter [name]  requesting that Social Work contact her about putting a hold in [employer]. [phone]

## 2018-06-07 NOTE — Progress Notes (Signed)
  Speech Language Pathology Treatment: Dysphagia  Patient Details Name: [name] Pace MRN: [id number] DOB: June 02, 1951 Today's Date: 06/07/2018 Time: 2595-6387 SLP Time Calculation (min) (ACUTE ONLY): 12 min  Assessment / Plan / Recommendation Clinical Impression  Saw pt at bedside for diet tolerance check.  Pt was advanced to mechanical soft diet on 12/31 by SLP.  Those orders were discontinued 12/31 and orders for regular texture diet were entered on 06/06/18.  Pt has been tolerating regular texture diet without reports of difficulty per RN.  Observed pt with trials of puree, regular, and mechanical soft solids. Pt required prolonged mastication with regular and soft solids. There was mild-moderate oral residue with soft fruit but trace to no residue with regular solids.  Pt reports regular diet at baseline, and has not had any adverse events with upgraded diet texture.  Pt states he would prefer to continue regular texture diet. Pt did not exhibit any clinical s/s of aspiration with any consistencies trialed.    Recommend continuing regular texture diet with thin liquid.   HPI HPI: Patient is a 68 year old male with history of alcohol abuse, CAD, history of CVA presented to ED with severe right-sided chest wall pain after a fall on the night of 12/7. Pt with R rib fxs 5-11. Intubated 12/13, self extubated 12/18. NPO, pulled cortrak 12/28. Pt advanced to mechanical soft diet 12/31, with those orders d/c'd and regular texture diet orders placed on 1/1.  Pt appears to be tolerating this diet at present.      SLP Plan  Continue with current plan of care       Recommendations  Diet recommendations: Regular;Thin liquid Supervision: Staff to assist with self feeding Compensations: Slow rate;Small sips/bites Postural Changes and/or Swallow Maneuvers: Seated upright 90 degrees                SLP Visit Diagnosis: Dysphagia, oropharyngeal phase (R13.12) Plan: Continue with current plan of  care       [place], [place], [place] Office: [phone]; Pager (1/2): [phone] 06/07/2018, 4:01 PM

## 2018-06-08 DIAGNOSIS — E512 Wernicke's encephalopathy: Secondary | ICD-10-CM

## 2018-06-08 LAB — BASIC METABOLIC PANEL
Anion gap: 8 (ref 5–15)
BUN: 6 mg/dL — ABNORMAL LOW (ref 8–23)
CALCIUM: 9.3 mg/dL (ref 8.9–10.3)
CO2: 23 mmol/L (ref 22–32)
Chloride: 107 mmol/L (ref 98–111)
Creatinine, Ser: 0.71 mg/dL (ref 0.61–1.24)
GFR calc Af Amer: >60 mL/min (ref >60)
GFR calc non Af Amer: >60 mL/min (ref >60)
Glucose, Bld: 97 mg/dL (ref 70–99)
Potassium: 3.6 mmol/L (ref 3.5–5.1)
Sodium: 138 mmol/L (ref 135–145)

## 2018-06-08 LAB — MAGNESIUM: Magnesium: 1.8 mg/dL (ref 1.7–2.4)

## 2018-06-08 LAB — GLUCOSE, CAPILLARY: Glucose-Capillary: 97 mg/dL (ref 70–99)

## 2018-06-08 MED ORDER — LORAZEPAM 0.5 MG PO TABS
0.5000 mg | ORAL_TABLET | Freq: Four times a day (QID) | ORAL | Status: DC | PRN
  Administered 2018-06-08 – 2018-06-09 (×2): 0.5 mg via ORAL
  Filled 2018-06-08 (×3): qty 1

## 2018-06-08 NOTE — Progress Notes (Addendum)
PROGRESS NOTE  [name] Pace KGM:[id number] DOB: [birth year] DOA: 05/13/2018 PCP: System, Provider Not In  Brief History   68 year old man with history of CVA, prostate cancer, alcohol use, substance use, hyperlipidemia came to the hospital after sustaining a fall with right-sided chest pain.  Work-up revealed multiple right-sided rib fracture along with small pneumothorax.  He also developed acute respiratory distress with significant alcohol withdrawal requiring intubation.  He was placed on Precedex drip.  He was also briefly on Levophed.  Patient underwent right-sided thoracentesis with removal of clear fluid.  He eventually self extubated 05/23/2018.  Since then patient has shown off-and-on confusion.  Also required NG tube feeding.  Seen by speech and swallow therapist who recommended dysphagia 3 diet.  A & P  Acute metabolic encephalopathy, delirium, Wernicke's encephalopathy.  Status post repletion with IV thiamine, continues on oral. --Oral thiamine --Minimize sedatives --Replace B12 --Continue Zyprexa --Fall precautions  Alcohol use, substance abuse --Continue thiamine, folic acid  Dysphagia. --Speech therapy recommending regular diet, thin liquids.  No clinical signs or symptoms of aspiration per note from 1/2.  Mechanical fall resulting in right-sided rib fracture --Supportive care  Moderate malnutrition --Ensure twice daily   Patient has not required mitts or a sitter today.  IV Ativan changed to p.o.  Received IV Ativan today.  Can transfer to skilled nursing facility as long as not requiring sitter, meds or IV medications for 24 hours.  DVT prophylaxis: Enoxaparin Code Status: Full cod Family Communication:  Disposition Plan: SNF    Murray Hodgkins, MD  [facility] Direct contact: [phone] --[place] [place]  --[email or web address]; password [email or web address]  7PM-7AM contact night coverage as above 06/08/2018, 7:38 PM  LOS: 25 days   Consultants   . Neurology  Procedures  .   Antibiotics  .   Interval History/Subjective  Per documentation was agitated overnight, pulled out IV, refused telemetry   Feels well, complains of some pain right lower abdomen.  Afebrile, vital signs stable  Objective   Vitals:  Vitals:   06/08/18 0900 06/08/18 1400  BP:  110/82  Pulse: 88 82  Resp:  18  Temp:    SpO2: 94% 93%    Exam:  Constitutional:  . Appears calm and comfortable Eyes:  . pupils and irises appear normal ENMT:  . grossly normal hearing  Respiratory:  . CTA bilaterally, no w/r/r.  . Respiratory effort normal.  Cardiovascular:  . RRR, no m/r/g . No LE extremity edema   Abdomen:  . Soft, nondistended, mild tenderness right lower quadrant Musculoskeletal:  .  RUE, LUE, RLE, LLE   . Grossly normal tone and strength Psychiatric:  . Mental status o Mood, affect appropriate o Oriented to self, month, year . judgment and insight appear difficult to gauge.   I have personally reviewed the following:   Today's Data  . Basic metabolic panel unremarkable.  Magnesium within normal limits.  CBG stable.  Scheduled Meds: . aspirin  81 mg Oral Daily  . chlorhexidine  15 mL Mouth Rinse BID  . enoxaparin (LOVENOX) injection  40 mg Subcutaneous Q24H  . feeding supplement (ENSURE ENLIVE)  237 mL Oral BID BM  . folic acid  1 mg Oral Daily  . lidocaine  2 patch Transdermal Q24H  . lidocaine  2 patch Transdermal Q24H  . mouth rinse  15 mL Mouth Rinse q12n4p  . nicotine  14 mg Transdermal Daily  . OLANZapine  2.5 mg Oral QHS  . polyethylene glycol  17 g  Oral Daily  . thiamine  100 mg Oral Daily  . vitamin B-12  1,000 mcg Oral Daily   Continuous Infusions: . sodium chloride 10 mL/hr (05/26/18 0522)    Principal Problem:   Delirium Active Problems:   Hyponatremia   Alcohol abuse   CAD (coronary artery disease)   History of stroke   Multiple rib fractures   Pneumothorax, closed, traumatic, initial encounter    Delirium tremens ([place])   Acute respiratory failure ([place])   Shock circulatory ([facility])   Malnutrition of moderate degree   Encephalopathy acute   Palliative care encounter   LOS: 25 days

## 2018-06-08 NOTE — Progress Notes (Signed)
Pt was agitated, having combative and aggressive behavior toward staff, refused cardiac telemonitor, pulled EKG out, disoriented x 4, tried to get out of the bed. Sedative medication PRN given followed by MD order. Bed alarm activated.He has gait instability and high fall risk and many times he was not compliant with care. Vital sign remains stable. Will continue to monitor.  Kennyth Lose, BSN, RN,PCCN-CMC

## 2018-06-08 NOTE — Clinical Social Work Note (Addendum)
Patient will have to be non-combative with no IV chemical/physical restraints or sitter for 24 hours before he can discharge to SNF.  [name] Pace, [place]

## 2018-06-08 NOTE — Progress Notes (Signed)
Physical Therapy Treatment Patient Details Name: [name] Pace MRN: [id number] DOB: 07-27-1950 Today's Date: 06/08/2018    History of Present Illness Patient is a 68 year old male with history of alcohol abuse, CAD, history of CVA presented to ED with severe right-sided chest wall pain after a fall on the night of 12/7. Pt with R rib fxs 5-11. Intubated 12/13, self extubated 12/18.     PT Comments    Pt making steady progress. Pt participatory and cooperative. Continue to recommend SNF.     Follow Up Recommendations  SNF;Supervision/Assistance - 24 hour     Equipment Recommendations  Other (comment)(TBD)    Recommendations for Other Services       Precautions / Restrictions Precautions Precautions: Fall Restrictions Weight Bearing Restrictions: No Other Position/Activity Restrictions: rib fractures    Mobility  Bed Mobility Overal bed mobility: Needs Assistance Bed Mobility: Supine to Sit     Supine to sit: Min assist Sit to supine: Min assist   General bed mobility comments: Assist to elevate trunk into sitting  Transfers Overall transfer level: Needs assistance Equipment used: Rolling walker (2 wheeled) Transfers: Sit to/from Stand Sit to Stand: +2 physical assistance;Min assist Stand pivot transfers: Mod assist       General transfer comment: Assist to bring hips up and for walker  Ambulation/Gait Ambulation/Gait assistance: +2 physical assistance;Min assist Gait Distance (Feet): 6 Feet Assistive device: Rolling walker (2 wheeled) Gait Pattern/deviations: Ataxic;Step-to pattern;Decreased step length - right;Decreased step length - left;Shuffle;Trunk flexed Gait velocity: decr Gait velocity interpretation: <1.31 ft/sec, indicative of household ambulator General Gait Details: Assist for balance and support. Verbal cues to stay closer to walker and stand more erect   Stairs             Wheelchair Mobility    Modified Rankin (Stroke Patients  Only)       Balance Overall balance assessment: Needs assistance;History of Falls Sitting-balance support: Feet supported;No upper extremity supported Sitting balance-Leahy Scale: Fair Sitting balance - Comments: able to complete bilateral table top grooming tasks while maintaining sitting balance   Standing balance support: Bilateral upper extremity supported Standing balance-Leahy Scale: Poor Standing balance comment: min assist for static standing with walker                            Cognition Arousal/Alertness: Awake/alert Behavior During Therapy: WFL for tasks assessed/performed Overall Cognitive Status: Impaired/Different from baseline Area of Impairment: Safety/judgement;Following commands;Attention;Memory;Problem solving;Orientation                 Orientation Level: Disoriented to;Place;Time;Situation Current Attention Level: Sustained Memory: Decreased short-term memory Following Commands: Follows one step commands consistently Safety/Judgement: Decreased awareness of safety;Decreased awareness of deficits Awareness: Emergent Problem Solving: Requires verbal cues;Requires tactile cues General Comments: Calm and cooperative during treatment      Exercises      General Comments General comments (skin integrity, edema, etc.): VSS      Pertinent Vitals/Pain Pain Assessment: No/denies pain Faces Pain Scale: Hurts little more Pain Location: ribs/abdomen Pain Descriptors / Indicators: Grimacing;Aching Pain Intervention(s): Limited activity within patient's tolerance;Monitored during session;Repositioned    Home Living                      Prior Function            PT Goals (current goals can now be found in the care plan section) Acute Rehab PT Goals Patient Stated Goal: "to  call a taxi and get out of here" Progress towards PT goals: Progressing toward goals    Frequency    Min 2X/week      PT Plan Current plan remains  appropriate    Co-evaluation              AM-PAC PT "6 Clicks" Mobility   Outcome Measure  Help needed turning from your back to your side while in a flat bed without using bedrails?: A Little Help needed moving from lying on your back to sitting on the side of a flat bed without using bedrails?: A Little Help needed moving to and from a bed to a chair (including a wheelchair)?: A Lot Help needed standing up from a chair using your arms (e.g., wheelchair or bedside chair)?: A Little Help needed to walk in hospital room?: A Little Help needed climbing 3-5 steps with a railing? : Total 6 Click Score: 15    End of Session Equipment Utilized During Treatment: Gait belt Activity Tolerance: Patient tolerated treatment well Patient left: in chair;Other (comment)(with OT present) Nurse Communication: Mobility status PT Visit Diagnosis: Unsteadiness on feet (R26.81);Repeated falls (R29.6);Difficulty in walking, not elsewhere classified (R26.2);Muscle weakness (generalized) (M62.81)     Time: 6195-0932 PT Time Calculation (min) (ACUTE ONLY): 10 min  Charges:  $Therapeutic Activity: 8-22 mins                     [place] Pager [phone] Office [place] 06/08/2018, 5:38 PM

## 2018-06-08 NOTE — Progress Notes (Signed)
Occupational Therapy Treatment Patient Details Name: [name] Pace MRN: [id number] DOB: 06-Sep-1950 Today's Date: 06/08/2018    History of present illness Patient is a 68 year old male with history of alcohol abuse, CAD, history of CVA presented to ED with severe right-sided chest wall pain after a fall on the night of 12/7. Pt with R rib fxs 5-11. Intubated 12/13, self extubated 12/18.    OT comments  Pt alert and cooperative, able to participate in BADL this date. Bed mobility completed at min A. Once EOB, pt needing total A to don socks. With min A +2 functional mobility with RW to sink, pt able to sit in chair to brush teeth and apply Deoderant. Pt fixated on calling taxi and offering OT money to drive him home from the hospital. With redirection, pt is pleasant this date. Mod A pivot back to bed for steadying A. D/c plans remain appropriate for SNF level of care. Will continue to follow pt acutely to maximize BADL independence.    Follow Up Recommendations  SNF;Supervision/Assistance - 24 hour    Equipment Recommendations  3 in 1 bedside commode    Recommendations for Other Services      Precautions / Restrictions Precautions Precautions: Fall Restrictions Weight Bearing Restrictions: No Other Position/Activity Restrictions: rib fractures       Mobility Bed Mobility Overal bed mobility: Needs Assistance Bed Mobility: Supine to Sit;Sit to Supine     Supine to sit: Min assist Sit to supine: Min assist      Transfers Overall transfer level: Needs assistance Equipment used: Rolling walker (2 wheeled) Transfers: Sit to/from Stand Sit to Stand: +2 physical assistance;Min assist Stand pivot transfers: Mod assist       General transfer comment: close steadying assist, cues for hand placement and RW management in BADL environment    Balance Overall balance assessment: Needs assistance;History of Falls Sitting-balance support: Feet supported;No upper extremity  supported Sitting balance-Leahy Scale: Good Sitting balance - Comments: able to complete bilateral table top grooming tasks while maintaining sitting balance   Standing balance support: Bilateral upper extremity supported;During functional activity Standing balance-Leahy Scale: Poor Standing balance comment: needs min A +2 to maintain static standing, reliant on external support                           ADL either performed or assessed with clinical judgement   ADL Overall ADL's : Needs assistance/impaired     Grooming: Set up;Sitting;Oral care;Wash/dry face;Brushing hair Grooming Details (indicate cue type and reason): EOB and in chair at sink; unable to toerlate standing activity at this time             Lower Body Dressing: Total assistance;Sit to/from stand Lower Body Dressing Details (indicate cue type and reason): sitting EOB< unable to reach to don socks or cross legs Toilet Transfer: +2 for physical assistance;Minimal assistance Toilet Transfer Details (indicate cue type and reason): simulated with chair           General ADL Comments: pt confused and disoriented this date, fixated on calling taxi drivers. Does well with distraction, random conversation to decrease fixations. Pt needing min A+2 to sink, then needing to sit in chair to complete BADL.  Mod A +1 stand pivot back to bed     Vision       Perception     Praxis      Cognition Arousal/Alertness: Awake/alert Behavior During Therapy: WFL for tasks assessed/performed Overall Cognitive  Status: Impaired/Different from baseline Area of Impairment: Safety/judgement;Following commands;Attention;Memory;Problem solving;Orientation                 Orientation Level: Disoriented to;Place;Time;Situation Current Attention Level: Sustained Memory: Decreased short-term memory Following Commands: Follows one step commands consistently Safety/Judgement: Decreased awareness of safety;Decreased  awareness of deficits Awareness: Emergent Problem Solving: Requires verbal cues;Requires tactile cues General Comments: Pt alert and cooperative, needing one step cues and redirection to stay on task        Exercises     Shoulder Instructions       General Comments      Pertinent Vitals/ Pain       Pain Assessment: Faces Faces Pain Scale: Hurts little more Pain Location: ribs/abdomen Pain Descriptors / Indicators: Grimacing;Aching Pain Intervention(s): Limited activity within patient's tolerance;Monitored during session;Repositioned  Home Living                                          Prior Functioning/Environment              Frequency  Min 2X/week        Progress Toward Goals  OT Goals(current goals can now be found in the care plan section)  Progress towards OT goals: Progressing toward goals  Acute Rehab OT Goals Patient Stated Goal: "to call a taxi and get out of here" OT Goal Formulation: With patient Time For Goal Achievement: 06/12/18 Potential to Achieve Goals: [place] Discharge plan remains appropriate;Frequency remains appropriate    Co-evaluation                 AM-PAC OT "6 Clicks" Daily Activity     Outcome Measure   Help from another person eating meals?: A Little Help from another person taking care of personal grooming?: A Little(sitting) Help from another person toileting, which includes using toliet, bedpan, or urinal?: Total Help from another person bathing (including washing, rinsing, drying)?: A Lot Help from another person to put on and taking off regular upper body clothing?: A Little Help from another person to put on and taking off regular lower body clothing?: A Lot 6 Click Score: 14    End of Session Equipment Utilized During Treatment: Gait belt  OT Visit Diagnosis: Unsteadiness on feet (R26.81);Muscle weakness (generalized) (M62.81)   Activity Tolerance Patient tolerated treatment well    Patient Left in bed;with call bell/phone within reach;with nursing/sitter in room   Nurse Communication Mobility status        Time: 6761-9509 OT Time Calculation (min): 12 min  Charges: OT General Charges $OT Visit: 1 Visit OT Treatments $Self Care/Home Management : 8-22 mins  Zenovia Jarred, MSOT, OTR/L [place] [facility] Office: [place] 06/08/2018, 4:09 PM

## 2018-06-09 DIAGNOSIS — R579 Shock, unspecified: Secondary | ICD-10-CM

## 2018-06-09 DIAGNOSIS — J939 Pneumothorax, unspecified: Secondary | ICD-10-CM

## 2018-06-09 DIAGNOSIS — E440 Moderate protein-calorie malnutrition: Secondary | ICD-10-CM

## 2018-06-09 LAB — BASIC METABOLIC PANEL
Anion gap: 7 (ref 5–15)
BUN: 9 mg/dL (ref 8–23)
CO2: 24 mmol/L (ref 22–32)
Calcium: 8.6 mg/dL — ABNORMAL LOW (ref 8.9–10.3)
Chloride: 108 mmol/L (ref 98–111)
Creatinine, Ser: 0.62 mg/dL (ref 0.61–1.24)
GFR calc Af Amer: >60 mL/min (ref >60)
GFR calc non Af Amer: >60 mL/min (ref >60)
Glucose, Bld: 111 mg/dL — ABNORMAL HIGH (ref 70–99)
Potassium: 3.2 mmol/L — ABNORMAL LOW (ref 3.5–5.1)
Sodium: 139 mmol/L (ref 135–145)

## 2018-06-09 LAB — MAGNESIUM: Magnesium: 1.7 mg/dL (ref 1.7–2.4)

## 2018-06-09 LAB — GLUCOSE, CAPILLARY: Glucose-Capillary: 109 mg/dL — ABNORMAL HIGH (ref 70–99)

## 2018-06-09 MED ORDER — POLYETHYLENE GLYCOL 3350 17 G PO PACK: 17.0000 g | PACK | Freq: Every day | ORAL | 0 refills | Status: DC

## 2018-06-09 MED ORDER — LIDOCAINE 5 % EX PTCH: 2.0000 | MEDICATED_PATCH | CUTANEOUS | 0 refills | Status: DC

## 2018-06-09 MED ORDER — OLANZAPINE 5 MG PO TABS: 2.5000 mg | ORAL_TABLET | Freq: Every day | ORAL | Status: DC | PRN

## 2018-06-09 MED ORDER — ENSURE ENLIVE PO LIQD: 237.0000 mL | Freq: Two times a day (BID) | ORAL | 12 refills | Status: DC

## 2018-06-09 MED ORDER — THIAMINE HCL 100 MG PO TABS: 100.0000 mg | ORAL_TABLET | Freq: Every day | ORAL | Status: DC

## 2018-06-09 MED ORDER — FOLIC ACID 1 MG PO TABS: 1.0000 mg | ORAL_TABLET | Freq: Every day | ORAL | Status: DC

## 2018-06-09 MED ORDER — OLANZAPINE 2.5 MG PO TABS: 5.0000 mg | ORAL_TABLET | Freq: Every day | ORAL | Status: DC

## 2018-06-09 MED ORDER — CYANOCOBALAMIN 1000 MCG PO TABS: 1000.0000 ug | ORAL_TABLET | Freq: Every day | ORAL | Status: DC

## 2018-06-09 MED ORDER — LORAZEPAM 0.5 MG PO TABS: 0.5000 mg | ORAL_TABLET | Freq: Three times a day (TID) | ORAL | 0 refills | Status: DC | PRN

## 2018-06-09 MED ORDER — POLYETHYLENE GLYCOL 3350 17 G PO PACK: 17.0000 g | PACK | Freq: Every day | ORAL | 0 refills | Status: DC | PRN

## 2018-06-09 NOTE — Discharge Summary (Signed)
Physician Discharge Summary  [name] Pace DOB: 09/18/1950 DOA: 05/13/2018  PCP: System, Provider Not In  Admit date: 05/13/2018 Discharge date: 06/09/2018  Admitted From: home Disposition:  SNF   Recommendations for Outpatient Follow-up:  1. Currently on PRN Ativan, goal would be to wean him off completely 2. F/u mental status- currently does not appear to be able to live alone     Discharge Condition:  stable   CODE STATUS:  Full code   Diet recommendation:  Regular diet Consultations:  neurology   Discharge Diagnoses:  Principal Problem:   Encephalopathy acute Active Problems:   Alcohol abuse and withdrawl   CAD (coronary artery disease)   History of stroke   Multiple rib fractures   Pneumothorax, closed, traumatic, initial encounter   Delirium tremens ([place])   Acute respiratory failure (HCC)   Rib fracture   Shock circulatory (HCC)   Malnutrition of moderate degree   Palliative care encounter     Brief Narrative:  [name] Pace 68 year old man with history of CVA, prostate cancer, alcohol use, substance use, hyperlipidemia came to the hospital after sustaining a fall with right-sided chest pain. Work-up revealed multiple right-sided rib fracture along with small pneumothorax. He also developed acute respiratory distress with significant alcohol withdrawal requiring intubation. He was placed on Precedex drip. He was also briefly on Levophed. Patient underwent right-sided thoracentesis with removal of clear fluid. He eventually self extubated 05/23/2018. Since then patient has shown off-and-on confusion. Also required NG tube feeding. Seen by speech and swallow therapist who recommended dysphagia 3 diet.   Subjective: Confused as to why he is in the hospital and does not know the month. He has no complaints other than wanting to leave the hospital.     Assessment & Plan:  Acute metabolic encephalopathy, delirium, Wernicke's encephalopathy?    Status post repletion with IV thiamine  - steadily improving- ? Of underlying dementia- will need outpt Neurology follow up if he remains impair --Oral thiamine continued --Replacing B12 which was 337 --Continue Zyprexa and PRN oral Ativan- wean off of Ativan first and then Zyprexa if able   Alcohol use, substance abuse --Continue thiamine, folic acid  Dysphagia. --Speech therapy recommending regular diet, thin liquids.  No clinical signs or symptoms of aspiration per note from 1/2.  Mechanical fall resulting in right-sided rib fracture and small pneumothorax --Supportive care  Moderate malnutrition --Ensure twice daily    Discharge Exam: Vitals:   06/08/18 2125 06/09/18 0430  BP: 109/67 92/72  Pulse: (!) 133   Resp:    Temp:  (!) 97.3 F (36.3 C)  SpO2: 100% 100%   Vitals:   06/08/18 0900 06/08/18 1400 06/08/18 2125 06/09/18 0430  BP:  110/82 109/67 92/72  Pulse: 88 82 (!) 133   Resp:  18    Temp:    (!) 97.3 F (36.3 C)  TempSrc:    Oral  SpO2: 94% 93% 100% 100%  Weight:      Height:        General: Pt is alert, awake, not in acute distress Cardiovascular: RRR, S1/S2 +, no rubs, no gallops Respiratory: CTA bilaterally, no wheezing, no rhonchi Abdominal: Soft, NT, ND, bowel sounds + Extremities: no edema, no cyanosis   Discharge Instructions  Discharge Instructions    Diet - low sodium heart healthy   Complete by:  As directed    Increase activity slowly   Complete by:  As directed      Allergies as of  06/09/2018      Reactions   Penicillins Anaphylaxis   throat      Medication List    TAKE these medications   aspirin 325 MG EC tablet Take 1 tablet (325 mg total) by mouth 2 (two) times daily with a meal. What changed:    how much to take  when to take this   cyanocobalamin 1000 MCG tablet Take 1 tablet (1,000 mcg total) by mouth daily.   feeding supplement (ENSURE ENLIVE) Liqd Take 237 mLs by mouth 2 (two) times daily between  meals.   folic acid 1 MG tablet Commonly known as:  FOLVITE Take 1 tablet (1 mg total) by mouth daily.   lidocaine 5 % Commonly known as:  LIDODERM Place 2 patches onto the skin daily. Remove & Discard patch within 12 hours or as directed by MD   lidocaine 5 % Commonly known as:  LIDODERM Place 2 patches onto the skin daily. Remove & Discard patch within 12 hours or as directed by MD   LORazepam 0.5 MG tablet Commonly known as:  ATIVAN Take 1 tablet (0.5 mg total) by mouth every 8 (eight) hours as needed for anxiety.   OLANZapine 2.5 MG tablet Commonly known as:  ZYPREXA Take 2 tablets (5 mg total) by mouth at bedtime.   OLANZapine 5 MG tablet Commonly known as:  ZYPREXA Take 0.5 tablets (2.5 mg total) by mouth daily as needed (agitation).   polyethylene glycol packet Commonly known as:  MIRALAX / GLYCOLAX Take 17 g by mouth daily.   polyethylene glycol packet Commonly known as:  MIRALAX / GLYCOLAX Take 17 g by mouth daily as needed for moderate constipation or severe constipation.   thiamine 100 MG tablet Take 1 tablet (100 mg total) by mouth daily.       Allergies  Allergen Reactions  . Penicillins Anaphylaxis    throat     Procedures/Studies:    Dg Chest 1 View  Result Date: 05/23/2018 CLINICAL DATA:  Status post right-sided thoracentesis with removal of 450 cc of fluid. EXAM: CHEST  1 VIEW COMPARISON:  Portable chest x-ray of May 22, 2018 FINDINGS: The volume of pleural fluid on the right has decreased somewhat. There is no postprocedure pneumothorax. There is no mediastinal shift. The left lung exhibits medial basal density compatible with atelectasis. The heart and pulmonary vascularity are normal. There are post CABG changes. The endotracheal tube tip projects approximately 3.4 cm above the carina. The esophagogastric tube tip projects below the inferior margin of the image. The right internal jugular venous catheter tip projects over the midportion of  the SVC. IMPRESSION: No postprocedure complication following right-sided thoracentesis. Persistent bibasilar atelectasis. Small right pleural effusion. The support tubes are in reasonable position. Electronically Signed   By: David  Martinique M.D.   On: 05/23/2018 09:31   Dg Abd 1 View  Result Date: 05/27/2018 CLINICAL DATA:  Nasogastric tube repositioning. EXAM: ABDOMEN - 1 VIEW COMPARISON:  Abdominal radiograph performed earlier today at 6:39 p.m. FINDINGS: The patient's enteric tube is noted coiled at the antrum and body of the stomach. The visualized bowel gas pattern is grossly unremarkable. No acute osseous abnormalities are seen. The patient is status post median sternotomy. A small right pleural effusion is again noted, with overlying displaced rib fractures. IMPRESSION: Enteric tube noted coiled at the antrum and body of the stomach. Electronically Signed   By: Garald Balding M.D.   On: 05/27/2018 22:18   Dg Abd 1 View  Result Date: 05/27/2018 CLINICAL DATA:  NG tube placement EXAM: ABDOMEN - 1 VIEW COMPARISON:  None. FINDINGS: Esophageal tube is folded back upon itself over the stomach. There may be a kink in the distal tube. Elevated right diaphragm with pleural and parenchymal disease. Nonobstructed bowel-gas pattern. IMPRESSION: Esophageal tube is folded back upon itself distally with possible kink in the distal tube. The tip is positioned over the expected location of gastric body Electronically Signed   By: Donavan Foil M.D.   On: 05/27/2018 19:52   Ct Head Wo Contrast  Result Date: 05/30/2018 CLINICAL DATA:  Altered mental status. History of breast cancer, stroke and alcohol abuse. EXAM: CT HEAD WITHOUT CONTRAST TECHNIQUE: Contiguous axial images were obtained from the base of the skull through the vertex without intravenous contrast. COMPARISON:  CT HEAD July 12, 2005 FINDINGS: BRAIN: No intraparenchymal hemorrhage, mass effect nor midline shift. Moderate parenchymal brain volume  loss. Severe vermian atrophy. No hydrocephalus. Old cystic RIGHT basal ganglia infarct with mild ex vacuo dilatation subjacent ventricle. Patchy to confluent supratentorial white matter hypodensities. No acute large vascular territory infarcts. No abnormal extra-axial fluid collections. Basal cisterns are patent. VASCULAR: Moderate calcific atherosclerosis of the carotid siphons. SKULL: No skull fracture. No significant scalp soft tissue swelling. SINUSES/ORBITS: Mild lobulated mucosal thickening. No paranasal sinus air-fluid levels. Mastoid air cells are well aerated.The included ocular globes and orbital contents are non-suspicious. OTHER: None. IMPRESSION: 1. No acute intracranial process. 2. Old RIGHT basal ganglia infarct. Moderate chronic small vessel ischemic changes. 3. Mild parenchymal brain volume loss, severe vermian atrophy. Electronically Signed   By: Elon Alas M.D.   On: 05/30/2018 00:40   Ct Chest Wo Contrast  Result Date: 05/19/2018 CLINICAL DATA:  Pneumothorax. EXAM: CT CHEST WITHOUT CONTRAST TECHNIQUE: Multidetector CT imaging of the chest was performed following the standard protocol without IV contrast. COMPARISON:  Radiographs of same day.  CT scan of May 13, 2018. FINDINGS: Cardiovascular: Atherosclerosis of thoracic aorta is noted. Coronary artery calcifications are noted. Normal cardiac size. No pericardial effusion. 2.4 x 1.9 cm rounded abnormality is seen to the left of ascending thoracic aorta concerning for aneurysm. Mediastinum/Nodes: Endotracheal and nasogastric tubes are in grossly good position. No significant adenopathy is noted. Thyroid gland is unremarkable. Lungs/Pleura: No definite pneumothorax is noted. Mild bilateral pleural effusions are noted with adjacent atelectasis or infiltrates of the lower lobes and right upper lobe. Upper Abdomen: No acute abnormality. Musculoskeletal: Multiple moderately displaced fractures are seen involving the right ribs.  Subcutaneous emphysema is seen overlying the right chest wall. IMPRESSION: No definite pneumothorax is noted. Moderate bilateral pleural effusions are noted with adjacent atelectasis or infiltrate seen in both lower lobes and right upper lobe, right greater than left. Multiple moderately displaced right rib fractures are noted with overlying subcutaneous emphysema of right chest wall. Endotracheal and nasogastric tubes are unchanged in position. Coronary artery calcifications are noted. 2.4 cm rounded abnormality is seen to the left of ascending thoracic aorta which is concerning for possible aneurysm. CTA of the chest is recommended for further evaluation. Aortic Atherosclerosis (ICD10-I70.0). Electronically Signed   By: Marijo Conception, M.D.   On: 05/19/2018 12:56   Ct Chest Wo Contrast  Result Date: 05/14/2018 CLINICAL DATA:  68 year old male with fall and trauma to the right chest wall. EXAM: CT CHEST WITHOUT CONTRAST TECHNIQUE: Multidetector CT imaging of the chest was performed following the standard protocol without IV contrast. COMPARISON:  Chest radiograph dated 07/02/2014 FINDINGS: Evaluation of this exam  is limited in the absence of intravenous contrast. Cardiovascular: Top-normal cardiac size. No pericardial effusion. Multi vessel coronary vascular calcification and postsurgical changes of CABG. A 2.3 x 2.1 cm rounded structure in the anterior mediastinum anterior to the pulmonary trunk (series 3, image 70) is not characterized on this noncontrast CT but most likely represents a saccular aneurysm. This can be further evaluated with contrast enhanced CT. There is mild atherosclerotic calcification of the thoracic aorta. The central pulmonary arteries are grossly unremarkable. A left subclavian stent is not well evaluated on this noncontrast CT. Mediastinum/Nodes: No hilar or mediastinal adenopathy. Small hiatal hernia. No mediastinal fluid collection. Lungs/Pleura: There is a background of emphysema.  Minimal bibasilar atelectatic changes. There is a trace right pleural effusion. Minimal pneumothorax in the right apex measures 3 mm in thickness. No focal consolidation. Small amount of mucus material noted in the central airway. The airways are otherwise patent. Upper Abdomen: Cholecystectomy. The visualized upper abdomen is otherwise unremarkable. Musculoskeletal: Multiple mildly displaced right rib fractures involving the right 5th-11th ribs. There is a small amount of chest wall emphysema. Right scapular fixation screws. IMPRESSION: 1. Multiple mildly displaced right rib fractures involving the right 5th-11th ribs. 2. Minimal right apical pneumothorax. 3. Rounded structure in the anterior mediastinum, likely a saccular aneurysm. This can be further evaluated with contrast enhanced CT on a nonemergent basis. These results were called by telephone at the time of interpretation on 05/14/2018 at 12:18 am to Dr. Addison Lank , who verbally acknowledged these results. Electronically Signed   By: Anner Crete M.D.   On: 05/14/2018 00:20   Mr Brain Wo Contrast  Result Date: 06/04/2018 CLINICAL DATA:  68 year old male with altered mental status, combative. EXAM: MRI HEAD WITHOUT CONTRAST TECHNIQUE: Multiplanar, multiecho pulse sequences of the brain and surrounding structures were obtained without intravenous contrast. COMPARISON:  Head CT 05/30/2018 and earlier. FINDINGS: Study is intermittently degraded by motion artifact despite repeated imaging attempts, and the examination had to be discontinued prior to completion due to patient agitation. Four sets of diffusion-weighted imaging were obtained, but otherwise only motion degraded axial T2 and FLAIR imaging could be obtained. Brain: No restricted diffusion or evidence of acute infarction. Chronic lacunar infarcts of the right basal ganglia and thalamus. Moderate nonspecific periventricular white matter T2 and FLAIR hyperintensity appears confluent. No  midline shift, mass effect, evidence of mass lesion, ventriculomegaly, extra-axial collection or acute intracranial hemorrhage. Vascular: Major intracranial vascular flow voids are preserved at the skull base. Skull and upper cervical spine: Not evaluated. Sinuses/Orbits: Negative. IMPRESSION: 1. Motion degraded study which had to be discontinued prior to completion due to patient agitation. 2. Negative for acute infarct. No acute intracranial abnormality is evident. 3. Chronic small vessel ischemia in the right deep gray matter nuclei. Electronically Signed   By: Genevie Ann M.D.   On: 06/04/2018 15:37   Dg Chest Port 1 View  Result Date: 05/29/2018 CLINICAL DATA:  68 year old male status post ultrasound-guided right side thoracentesis this morning. Status post fall with right chest wall trauma earlier this month. EXAM: PORTABLE CHEST 1 VIEW COMPARISON:  Portable chest 05/28/2018 and earlier. FINDINGS: Portable AP semi upright view at 1219 hours. Decreased veiling opacity in the right lung compared to yesterday. No pneumothorax. Stable cardiac size and mediastinal contours. Stable right PICC line and visible enteric feeding tube. Prior CABG. Left common carotid artery region vascular stent. Stable and negative left lung ventilation. Numerous displaced right lateral rib fractures again noted. IMPRESSION: Decreased right pleural  effusion and no pneumothorax following thoracentesis. Electronically Signed   By: Genevie Ann M.D.   On: 05/29/2018 12:34   Dg Chest Port 1 View  Result Date: 05/28/2018 CLINICAL DATA:  Respiratory failure EXAM: PORTABLE CHEST 1 VIEW COMPARISON:  May 27, 2018 FINDINGS: Feeding tube tip is in the stomach. Central catheter tip is in the superior vena cava. No pneumothorax a. There is a moderate pleural effusion on the right with consolidation throughout the right mid and lower lung zones. There is mild upper lobe atelectatic change bilaterally. No consolidation on the left. There is  cardiomegaly with pulmonary venous hypertension. Patient is status post coronary artery bypass grafting. A stent is noted in the left subclavian region medially, stable. IMPRESSION: Tube and catheter positions as described without pneumothorax. Right pleural effusion with consolidation in the right mid and lower lung zones. Some of this consolidation is due to atelectasis. Pneumonia on the right is suspected, however. There is mild upper lobe atelectasis bilaterally. There is pulmonary vascular congestion. Postoperative changes are noted. Electronically Signed   By: Lowella Grip III M.D.   On: 05/28/2018 07:10   Dg Chest Port 1 View  Result Date: 05/27/2018 CLINICAL DATA:  Increased shortness of breath, history breast cancer, coronary artery disease, prostate cancer EXAM: PORTABLE CHEST 1 VIEW COMPARISON:  Portable exam 1316 hours compared to 05/26/2018 FINDINGS: RIGHT arm PICC line tip projects over SVC. Feeding tube extends into stomach. Borderline enlargement of cardiac silhouette post CABG. Mediastinal contours and pulmonary vascularity normal. Persistent RIGHT pleural effusion and basilar atelectasis versus consolidation. LEFT lung clear. No pneumothorax. Bones demineralized. IMPRESSION: Persistent RIGHT pleural effusion and basilar atelectasis versus consolidation. Borderline enlargement of cardiac silhouette post CABG. Electronically Signed   By: Lavonia Dana M.D.   On: 05/27/2018 13:41   Dg Chest Port 1 View  Result Date: 05/26/2018 CLINICAL DATA:  Acute onset of respiratory distress. Agitation. EXAM: PORTABLE CHEST 1 VIEW COMPARISON:  Chest radiograph performed 05/23/2018 FINDINGS: A small right pleural effusion is noted. Hazy right-sided and left basilar airspace opacification raises concern for pneumonia, though asymmetric pulmonary edema might have a similar appearance. No pneumothorax is seen. The cardiomediastinal silhouette is mildly enlarged. The patient is status post median  sternotomy, with evidence of prior CABG. An enteric tube is noted extending overlying the body of the stomach. A right PICC is noted ending about the mid SVC. Postoperative change is noted at the right glenoid, with chronic loss of the right glenohumeral joint space. No acute osseous abnormalities are identified. IMPRESSION: 1. Small right pleural effusion noted. Hazy right-sided and left basilar airspace opacification raises concern for pneumonia, though asymmetric pulmonary edema might have a similar appearance. 2. Mild cardiomegaly. Electronically Signed   By: Garald Balding M.D.   On: 05/26/2018 23:20   Dg Chest Port 1 View  Result Date: 05/23/2018 CLINICAL DATA:  Recent fall with chest pain and cough, initial encounter EXAM: PORTABLE CHEST 1 VIEW COMPARISON:  05/23/18 FINDINGS: Endotracheal tube and nasogastric catheter have been removed in the interval. Right jugular central line remains in place. Postsurgical changes are seen. Cardiac shadow is stable. Left subclavian arterial stent is seen. Multiple right-sided rib fractures are noted with slight increase in the degree of right-sided pleural effusion and underlying atelectatic changes. No pneumothorax is seen. IMPRESSION: Increasing right-sided effusion and atelectasis. Multiple right rib fractures. Electronically Signed   By: Inez Catalina M.D.   On: 05/23/2018 12:24   Dg Chest Port 1 View  Result Date:  05/22/2018 CLINICAL DATA:  Respiratory failure EXAM: PORTABLE CHEST 1 VIEW COMPARISON:  05/21/2018 FINDINGS: Support devices are stable. Cardiomegaly. Bilateral airspace disease and layering effusions, right greater than left. This could reflect asymmetric edema or infection. No significant change since prior study. IMPRESSION: Bilateral airspace disease and layering effusions, right greater than left. This could reflect asymmetric edema or infection. No change. Electronically Signed   By: Rolm Baptise M.D.   On: 05/22/2018 08:42   Dg Chest Port 1  View  Result Date: 05/21/2018 CLINICAL DATA:  Ventilator support EXAM: PORTABLE CHEST 1 VIEW COMPARISON:  05/19/2018 FINDINGS: Endotracheal tube tip is 3 cm above the carina. Nasogastric tube enters the abdomen. Right internal jugular central line tip is in the SVC 2 cm above the right atrium. Bilateral lower lobe volume loss persists with pleural fluid on the right. Findings are slightly worsened since 2 days ago. IMPRESSION: Worsening lower lobe volume loss and effusions right larger than left. Electronically Signed   By: Nelson Chimes M.D.   On: 05/21/2018 07:26   Dg Chest Port 1 View  Result Date: 05/19/2018 CLINICAL DATA:  Hemothorax EXAM: PORTABLE CHEST 1 VIEW COMPARISON:  05/19/2018 FINDINGS: Support devices are stable. Multiple right lateral rib fractures are again noted. Bilateral pleural effusions, right greater than left. Bibasilar atelectasis or infiltrates. No visible pneumothorax. IMPRESSION: No significant change since prior study. Electronically Signed   By: Rolm Baptise M.D.   On: 05/19/2018 16:09   Dg Chest Port 1 View  Result Date: 05/19/2018 CLINICAL DATA:  History of ETT EXAM: PORTABLE CHEST 1 VIEW COMPARISON:  May 18, 2018 FINDINGS: The ETT terminates in good position. The right central line is stable and unremarkable. The enteric tube terminates below today's film. No pneumothorax. Numerous right-sided rib fractures again noted. Right-sided pleural effusion with underlying opacity is similar in the interval. Possible small layering effusion on the left, less prominent. The cardiomediastinal silhouette is stable. No other acute abnormalities. IMPRESSION: 1. Support apparatus as above. 2. Numerous right-sided rib fractures, stable.  No pneumothorax. 3. Right greater than left pleural effusions with underlying opacities, stable on the right and improved on the left. Electronically Signed   By: Dorise Bullion III M.D   On: 05/19/2018 10:04   Dg Chest Port 1 View  Result Date:  05/18/2018 CLINICAL DATA:  Encounter for central line placement. EXAM: PORTABLE CHEST 1 VIEW COMPARISON:  05/18/2018 FINDINGS: There are postoperative changes from median sternotomy and CABG procedure. Right IJ catheter tip projects over the distal SVC. There is an ET tube with tip above the carina. Enteric tube tip is below the level of the GE junction. Normal heart size. Persistent bilateral pleural effusions are identified. Mild interstitial edema noted. There is a air fluid level identified overlying the right mid and lower lung which may reflect a component of loculated pneumothorax. Multiple right lateral rib fracture deformities are identified. Similar to previous exam. Subcutaneous gas within the right lateral chest wall noted. IMPRESSION: 1. Persistent bilateral pleural effusions. There is a fluid level projecting over the right mid and lower lung which is indeterminate but could represent a component of loculated hydropneumothorax. This could be better assessed with CT of the chest. 2. Right rib fractures with overlying subcutaneous emphysema. Electronically Signed   By: Kerby Moors M.D.   On: 05/18/2018 10:46   Dg Chest Port 1 View  Result Date: 05/18/2018 CLINICAL DATA:  ET tube adjustment EXAM: PORTABLE CHEST 1 VIEW COMPARISON:  May 18, 2018 FINDINGS:  An ET tube terminates in the mid trachea, in good position. An enteric tube terminates below today's film. No pneumothorax. Air in the right chest wall remains, stable. Stable cardiomegaly. The hila and mediastinum are unchanged. Layering effusion on the right with underlying opacity, similar. Mild patchy opacity in left base, slightly more prominent. No other interval changes. IMPRESSION: 1. Support apparatus as above. 2. Stable effusion and underlying opacity in the right base. 3. Mild opacity in the left base is slightly more prominent in the interval. Recommend attention on follow-up. Electronically Signed   By: Dorise Bullion III M.D    On: 05/18/2018 09:05   Dg Chest Port 1 View  Result Date: 05/18/2018 CLINICAL DATA:  Respiratory difficulty EXAM: PORTABLE CHEST 1 VIEW COMPARISON:  05/18/2018 418 hours FINDINGS: NG tube has been placed. The tip is in the antrum of the stomach. Endotracheal tube is not clearly visualized. Normal heart size. Low lung volumes. Bibasilar opacity likely combination of atelectasis and airspace disease is stable. No evidence of pneumothorax. Right-sided rib fractures are again noted. IMPRESSION: NG tube placed into the stomach. Endotracheal tube is not visualized. Stable bibasilar pulmonary opacities as described No sign of pneumothorax. Multiple right-sided rib fractures are unchanged. Electronically Signed   By: Marybelle Killings M.D.   On: 05/18/2018 08:24   Dg Chest Port 1 View  Result Date: 05/18/2018 CLINICAL DATA:  67 y/o  M; increased shortness of breath. EXAM: PORTABLE CHEST 1 VIEW COMPARISON:  05/17/2018 chest radiograph. FINDINGS: Multiple displaced right lateral rib fractures are stable. Subcutaneous emphysema within the right chest wall again noted. Linear structure at the right lung base is stable possibly representing subpulmonic pneumothorax or atelectasis as described on prior study. Stable bibasilar atelectasis. Stable right effusion. Stable cardiac silhouette given projection and technique. Post median sternotomy and CABG with wires in alignment. Stable break in the sternotomy wire second from bottom. Postsurgical changes of the right shoulder joint. IMPRESSION: 1. Stable linear structure at the right lung base possibly representing subpulmonic pneumothorax or atelectasis as described on prior study. 2. Stable displaced right lateral rib fractures and subcutaneous emphysema of the right chest wall. 3. Stable bibasilar atelectasis and right-sided effusion. Electronically Signed   By: Kristine Garbe M.D.   On: 05/18/2018 05:35   Dg Chest Port 1 View  Result Date: 05/17/2018 CLINICAL  DATA:  Severe right-sided chest pain after falling on 05/12/2018 EXAM: PORTABLE CHEST 1 VIEW COMPARISON:  05/14/2018.  05/13/2018. FINDINGS: Artifact overlies the chest. Previous median sternotomy and CABG with cardiomegaly. Increased density of the left lung base medially most consistent with atelectasis and or pneumonia. On the right, I think there is a pneumothorax loculated at the right base. This appears to have enlarged. There is worsened atelectasis at the right base. Subcutaneous air veins evident on the right. Multiple right-sided rib fractures as seen previously IMPRESSION: Newly seen lucency of the right lung base that looks like an enlarging basilar pneumothorax. This is no more than about 15-20%. Alternatively, this could relate to a pattern of atelectasis, merely simulating a basilar pneumothorax. Worsened atelectasis at both lung bases. Persistent and increased subcutaneous emphysema on the right. These results will be called to the ordering clinician or representative by the Radiologist Assistant, and communication documented in the PACS or zVision Dashboard. Electronically Signed   By: Nelson Chimes M.D.   On: 05/17/2018 20:06   Dg Chest Port 1 View  Result Date: 05/14/2018 CLINICAL DATA:  67 year old male with right rib fractures. EXAM:  PORTABLE CHEST 1 VIEW COMPARISON:  Chest CT dated 05/13/2018 FINDINGS: No visible pneumothorax. Right rib fractures with associated soft tissue emphysema of the right lateral chest wall. Bibasilar atelectasis. Cardiomegaly, median sternotomy wires, and CABG vascular clips. Left subclavian artery stent. IMPRESSION: 1. No visible pneumothorax. 2. Right rib fractures with associated soft tissue emphysema of the right lateral chest wall. Electronically Signed   By: Anner Crete M.D.   On: 05/14/2018 05:56   [place] Ekg Site Rite  Result Date: 05/25/2018 If Site Rite image not attached, placement could not be confirmed due to current cardiac rhythm.  [place] Ekg Site  Rite  Result Date: 05/15/2018 If Site Rite image not attached, placement could not be confirmed due to current cardiac rhythm.  US Thoracentesis Asp Pleural Space W/img Guide  Result Date: 05/29/2018 INDICATION: Symptomatic right sided pleural effusion EXAM: US THORACENTESIS ASP PLEURAL SPACE W/IMG GUIDE- PORTABLE COMPARISON:  None. MEDICATIONS: 10 cc 1% lidocaine COMPLICATIONS: None immediate. TECHNIQUE: Informed written consent was obtained from the patient after a discussion of the risks, benefits and alternatives to treatment. A timeout was performed prior to the initiation of the procedure. Initial ultrasound scanning demonstrates a right pleural effusion. The lower chest was prepped and draped in the usual sterile fashion. 1% lidocaine was used for local anesthesia. Under direct ultrasound guidance, a 19 gauge, 7-cm, Yueh catheter was introduced. An ultrasound image was saved for documentation purposes. The thoracentesis was performed. The catheter was removed and a dressing was applied. The patient tolerated the procedure well without immediate post procedural complication. The patient was escorted to have an upright chest radiograph. FINDINGS: A total of approximately 180 cc of blood tinged fluid was removed. Requested samples were sent to the laboratory. IMPRESSION: Successful ultrasound-guided right sided thoracentesis yielding 180 cc of pleural fluid. Read by Lavonia Drafts [facility] Electronically Signed   By: Jerilynn Mages.  Shick M.D.   On: 05/29/2018 13:58   US Thoracentesis Asp Pleural Space W/img Guide  Result Date: 05/23/2018 INDICATION: Recent fall with multiple right rib fractures. Right pleural effusion. Failure to wean from mechanical ventilator. Request for diagnostic and therapeutic thoracentesis. EXAM: ULTRASOUND GUIDED RIGHT THORACENTESIS MEDICATIONS: 1% lidocaine 10 mL COMPLICATIONS: None immediate. PROCEDURE: An ultrasound guided thoracentesis was thoroughly discussed with the patient and  questions answered. The benefits, risks, alternatives and complications were also discussed. The patient understands and wishes to proceed with the procedure. Written consent was obtained. Ultrasound was performed to localize and mark an adequate pocket of fluid in the right chest. The area was then prepped and draped in the normal sterile fashion. 1% Lidocaine was used for local anesthesia. Under ultrasound guidance a 6 Fr Safe-T-Centesis catheter was introduced. Thoracentesis was performed. The catheter was removed and a dressing applied. FINDINGS: A total of approximately 450 mL of clear red fluid was removed. Samples were sent to the laboratory as requested by the clinical team. IMPRESSION: Successful ultrasound guided right thoracentesis yielding 450 mL of pleural fluid. No pneumothorax on post-procedure chest x-ray. Read by: Gareth Eagle, PA-C Electronically Signed   By: Jacqulynn Cadet M.D.   On: 05/23/2018 10:18     The results of significant diagnostics from this hospitalization (including imaging, microbiology, ancillary and laboratory) are listed below for reference.     Microbiology: No results found for this or any previous visit (from the past 240 hour(s)).   Labs: BNP (last 3 results) No results for input(s): BNP in the last 8760 hours. Basic Metabolic Panel: Recent Labs  Lab 06/07/18 0546 06/07/18 0900 06/08/18 0926 06/09/18 0430  NA 137  --  138 139  K 4.9  --  3.6 3.2*  CL 111  --  107 108  CO2 14*  --  23 24  GLUCOSE 82  --  97 111*  BUN 10  --  6* 9  CREATININE 0.70  --  0.71 0.62  CALCIUM 8.6*  --  9.3 8.6*  MG  --  1.8 1.8 1.7   Liver Function Tests: No results for input(s): AST, ALT, ALKPHOS, BILITOT, PROT, ALBUMIN in the last 168 hours. No results for input(s): LIPASE, AMYLASE in the last 168 hours. Recent Labs  Lab 06/04/18 1224  AMMONIA 34   CBC: No results for input(s): WBC, NEUTROABS, HGB, HCT, MCV, PLT in the last 168 hours. Cardiac Enzymes: No  results for input(s): CKTOTAL, CKMB, CKMBINDEX, TROPONINI in the last 168 hours. BNP: Invalid input(s): POCBNP CBG: Recent Labs  Lab 06/07/18 0912 06/07/18 1127 06/07/18 1633 06/07/18 2227 06/08/18 0635  GLUCAP 94 95 131* 112* 97   D-Dimer No results for input(s): DDIMER in the last 72 hours. Hgb A1c No results for input(s): HGBA1C in the last 72 hours. Lipid Profile No results for input(s): CHOL, HDL, LDLCALC, TRIG, CHOLHDL, LDLDIRECT in the last 72 hours. Thyroid function studies No results for input(s): TSH, T4TOTAL, T3FREE, THYROIDAB in the last 72 hours.  Invalid input(s): FREET3 Anemia work up No results for input(s): VITAMINB12, FOLATE, FERRITIN, TIBC, IRON, RETICCTPCT in the last 72 hours. Urinalysis    Component Value Date/Time   COLORURINE AMBER (A) 05/17/2018 1641   APPEARANCEUR HAZY (A) 05/17/2018 1641   LABSPEC 1.019 05/17/2018 1641   PHURINE 6.0 05/17/2018 1641   GLUCOSEU NEGATIVE 05/17/2018 1641   HGBUR NEGATIVE 05/17/2018 1641   BILIRUBINUR NEGATIVE 05/17/2018 1641   KETONESUR 80 (A) 05/17/2018 1641   PROTEINUR NEGATIVE 05/17/2018 1641   UROBILINOGEN 0.2 06/14/2014 2135   NITRITE POSITIVE (A) 05/17/2018 1641   LEUKOCYTESUR SMALL (A) 05/17/2018 1641   Sepsis Labs Invalid input(s): PROCALCITONIN,  WBC,  LACTICIDVEN Microbiology No results found for this or any previous visit (from the past 240 hour(s)).   Time coordinating discharge in minutes: 65  SIGNED:   Debbe Odea, MD  [facility] 06/09/2018, 12:03 PM Pager   If 7PM-7AM, please contact night-coverage [email or web address] Password [email or web address]

## 2018-06-09 NOTE — Clinical Social Work Placement (Signed)
   CLINICAL SOCIAL WORK PLACEMENT  NOTE  Date:  06/09/2018  Patient Details  Name: [name] HOLLIBAUGH MRN: [id number] Date of Birth: 03-29-1951  Clinical Social Work is seeking post-discharge placement for this patient at the [place] level of care (*CSW will initial, date and re-position this form in  chart as items are completed):  Yes   Patient/family provided with [place] Work Department's list of facilities offering this level of care within the geographic area requested by the patient (or if unable, by the patient's family).  Yes   Patient/family informed of their freedom to choose among providers that offer the needed level of care, that participate in Medicare, Medicaid or managed care program needed by the patient, have an available bed and are willing to accept the patient.  Yes   Patient/family informed of [facility]'s ownership interest in [facility] and [facility], as well as of the fact that they are under no obligation to receive care at these facilities.  PASRR submitted to EDS on 06/07/18     PASRR number received on       Existing PASRR number confirmed on 06/07/18     FL2 transmitted to all facilities in geographic area requested by pt/family on 06/07/18     FL2 transmitted to all facilities within larger geographic area on       Patient informed that his/her managed care company has contracts with or will negotiate with certain facilities, including the following:        Yes   Patient/family informed of bed offers received.  Patient chooses bed at [facility] recommends and patient chooses bed at      Patient to be transferred to [facility] on 06/09/18.  Patient to be transferred to facility by [facility]     Patient family notified on 06/09/18 of transfer.  Name of family member notified:  Patient's daughter [name]     PHYSICIAN Please sign FL2, Please prepare priority  discharge summary, including medications     Additional Comment:    _______________________________________________ Ross Ludwig, LCSWA 06/09/2018, 3:51 PM

## 2018-06-09 NOTE — Progress Notes (Signed)
Report called to receiving nurse, [place], at [facility]. All questions answered.
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
## 2018-06-12 ENCOUNTER — Non-Acute Institutional Stay (SKILLED_NURSING_FACILITY): Payer: Medicare Other | Admitting: Internal Medicine

## 2018-06-12 ENCOUNTER — Encounter: Payer: Self-pay | Admitting: Internal Medicine

## 2018-06-12 DIAGNOSIS — Z9189 Other specified personal risk factors, not elsewhere classified: Secondary | ICD-10-CM | POA: Insufficient documentation

## 2018-06-12 DIAGNOSIS — S2241XG Multiple fractures of ribs, right side, subsequent encounter for fracture with delayed healing: Secondary | ICD-10-CM | POA: Diagnosis not present

## 2018-06-12 DIAGNOSIS — G9340 Encephalopathy, unspecified: Secondary | ICD-10-CM

## 2018-06-12 DIAGNOSIS — S270XXD Traumatic pneumothorax, subsequent encounter: Secondary | ICD-10-CM

## 2018-06-12 NOTE — Patient Instructions (Signed)
See assessment and plan under each diagnosis in the problem list and acutely for this visit 

## 2018-06-12 NOTE — Progress Notes (Signed)
NURSING HOME LOCATION:  [facility] ROOM NUMBER:  107-A  CODE STATUS:  Full Code  PCP:  System, Provider Not In   This is a comprehensive admission note to [facility] performed on this date less than 30 days from date of admission. Included are preadmission medical/surgical history; reconciled medication list; family history; social history and comprehensive review of systems.  Corrections and additions to the records were documented. Comprehensive physical exam was also performed. Additionally a clinical summary was entered for each active diagnosis pertinent to this admission in the Problem List to enhance continuity of care.  HPI: The patient had a prolonged hospitalization 05/13/2018-06/09/2018.  He was admitted with right-sided chest pain sustained in a mechanical fall.  Work-up revealed multiple right-sided rib fractures along with a small pneumothorax.  This was complicated by acute respiratory distress in the context of alcohol withdrawal requiring intubation.  Precedex drip was initiated and he was also briefly on Levophed.  Right-sided thoracentesis revealed transudate.  He self extubated himself 05/23/2018.  Following the self extubation he had alternating confusion.   NG tube feeding was required.  Dysphagia 3 diet was recommended by speech therapy.  Ensure twice daily was prescribed for moderate malnutrition. He received IV thiamine for the encephalopathy with some improvement.  This was transitioned to oral thiamine.  Questionable underlying dementia was suspected.  B12 level was low normal at 337.  Zyprexa and as needed Ativan were continued.  At discharge he was on Ativan as needed; ultimate goal was to wean him off completely if possible.  Competency was questionable; clinically it was felt that he could not live alone.  Past medical and surgical history: Includes alcohol abuse, possible substance abuse, coronary artery disease, history of stroke, dyslipidemia, and  history of prostate cancer.  There is also a history of left breast cancer which the patient denies. He has had a coronary artery bypass grafting and carotid stenting.  Social history: Alcohol  abuse; pack-a-day smoker for over 4 decades.  His daughter who is a Audiological scientist states that he has predominately abused alcohol but also other substances.  Illicit substances abuse include benzodiazepines, opiates and crack cocaine.  She stated that he would snort Percocet.  She states he lives with a girlfriend drug addict.  Family history: Limited history reviewed.  Mother had several strokes and congestive heart failure.  Father had diabetes.  His brother is a [place] veteran.   Review of systems: His responses are questionable.  He gave the date as June 24, 2018.  He could not name the president.  He stated that alcohol is "starting to be a problem".  He states he plans to go to AA as well as NA.  When I ask if he had used drugs he said "that is not my thing".  He planned to go to NA "to help my girlfriend".  When I asked why he had been in the hospital he stated that he had "bruised his ribs".  He denies active review of systems except for occasional cough and anxiety.    Constitutional: No fever, significant weight change, fatigue  Eyes: No redness, discharge, pain, vision change ENT/mouth: No nasal congestion, purulent discharge, earache, change in hearing, sore throat  Cardiovascular: No chest pain, palpitations, paroxysmal nocturnal dyspnea, claudication, edema  Respiratory: No sputum production, hemoptysis, DOE, significant snoring, apnea Gastrointestinal: No heartburn, dysphagia, abdominal pain, nausea /vomiting, rectal bleeding, melena, change in bowels Genitourinary: No dysuria, hematuria, pyuria, incontinence, nocturia Musculoskeletal: No joint stiffness,  joint swelling, weakness, pain Dermatologic: No rash, pruritus, change in appearance of skin Neurologic: No dizziness, headache, syncope,  seizures, numbness, tingling Endocrine: No change in hair/skin/nails, excessive thirst, excessive hunger, excessive urination  Hematologic/lymphatic: No significant bruising, lymphadenopathy, abnormal bleeding Allergy/immunology: No itchy/watery eyes, significant sneezing, urticaria, angioedema  Physical exam:  Pertinent or positive findings: He appears disheveled and chronically ill.  Pattern alopecia is present, mainly over the crown.  He is Geneticist, molecular.  He is completely edentulous.  Intermittently there is slight exotropia of the right eye.  There is a uriniferous odor to his person.  Breath sounds are decreased except for low-grade rhonchi and rales at the bases.  This is more noticeable in the right lower lobe posteriorly.  Pedal pulses are decreased.  Atrophy of the limbs present, but he is surprisingly strong to opposition.  Clubbing of the nailbeds is suggested.  General appearance:  no acute distress, increased work of breathing is present.   Lymphatic: No lymphadenopathy about the head, neck, axilla. Eyes: No conjunctival inflammation or lid edema is present. There is no scleral icterus. Ears:  External ear exam shows no significant lesions or deformities.   Nose:  External nasal examination shows no deformity or inflammation. Nasal mucosa are pink and moist without lesions, exudates Oral exam: Lips and gums are healthy appearing.There is no oropharyngeal erythema or exudate. Neck:  No thyromegaly, masses, tenderness noted.    Heart:  Normal rate and regular rhythm. S1 and S2 normal without gallop, murmur, click, rub.  Abdomen: Bowel sounds are normal.  Abdomen is soft and nontender with no organomegaly, hernias, masses. GU: Deferred  Extremities:  No cyanosis,edema. Neurologic exam: Balance, Rhomberg, finger to nose testing could not be completed due to clinical state Skin: Warm & dry w/o tenting. No significant lesions or rash.  See clinical summary under each active problem in the  Problem List with associated updated therapeutic plan

## 2018-06-12 NOTE — Assessment & Plan Note (Signed)
Treatment focused on the alcohol abuse with prophylactic therapies Allegedly patient has a AA sponsor

## 2018-06-12 NOTE — Assessment & Plan Note (Addendum)
Discussed with his daughter [name] Pace [phone] AA and NA are appropriate; compliance would be the greatest issue

## 2018-06-12 NOTE — Assessment & Plan Note (Deleted)
Treatment focused on the alcohol abuse with prophylactic therapies

## 2018-06-14 NOTE — Assessment & Plan Note (Signed)
PT/OT at SNF He does not believe that he can return to independent living

## 2018-07-05 LAB — ACID FAST CULTURE WITH REFLEXED SENSITIVITIES: ACID FAST CULTURE - AFSCU3: NEGATIVE

## 2018-07-05 LAB — ACID FAST CULTURE WITH REFLEXED SENSITIVITIES (MYCOBACTERIA)

## 2018-08-18 ENCOUNTER — Inpatient Hospital Stay (HOSPITAL_COMMUNITY)
Admission: EM | Admit: 2018-08-18 | Discharge: 2018-08-21 | DRG: 896 | Disposition: A | Discharge status: Home / Self Care | Payer: Medicare Other | Attending: Internal Medicine | Admitting: Internal Medicine

## 2018-08-18 ENCOUNTER — Emergency Department (HOSPITAL_COMMUNITY): Payer: Medicare Other

## 2018-08-18 DIAGNOSIS — Z880 Allergy status to penicillin: Secondary | ICD-10-CM

## 2018-08-18 DIAGNOSIS — S2242XA Multiple fractures of ribs, left side, initial encounter for closed fracture: Secondary | ICD-10-CM

## 2018-08-18 DIAGNOSIS — F1010 Alcohol abuse, uncomplicated: Secondary | ICD-10-CM | POA: Diagnosis present

## 2018-08-18 DIAGNOSIS — E876 Hypokalemia: Secondary | ICD-10-CM | POA: Diagnosis present

## 2018-08-18 DIAGNOSIS — F17210 Nicotine dependence, cigarettes, uncomplicated: Secondary | ICD-10-CM | POA: Diagnosis present

## 2018-08-18 DIAGNOSIS — R0789 Other chest pain: Secondary | ICD-10-CM

## 2018-08-18 DIAGNOSIS — Z853 Personal history of malignant neoplasm of breast: Secondary | ICD-10-CM

## 2018-08-18 DIAGNOSIS — Z79899 Other long term (current) drug therapy: Secondary | ICD-10-CM

## 2018-08-18 DIAGNOSIS — E7800 Pure hypercholesterolemia, unspecified: Secondary | ICD-10-CM | POA: Diagnosis present

## 2018-08-18 DIAGNOSIS — R079 Chest pain, unspecified: Secondary | ICD-10-CM | POA: Diagnosis present

## 2018-08-18 DIAGNOSIS — Z79810 Long term (current) use of selective estrogen receptor modulators (SERMs): Secondary | ICD-10-CM

## 2018-08-18 DIAGNOSIS — G9341 Metabolic encephalopathy: Secondary | ICD-10-CM | POA: Diagnosis present

## 2018-08-18 DIAGNOSIS — F10931 Alcohol use, unspecified with withdrawal delirium: Secondary | ICD-10-CM | POA: Diagnosis present

## 2018-08-18 DIAGNOSIS — I2510 Atherosclerotic heart disease of native coronary artery without angina pectoris: Secondary | ICD-10-CM | POA: Diagnosis present

## 2018-08-18 DIAGNOSIS — Z7982 Long term (current) use of aspirin: Secondary | ICD-10-CM

## 2018-08-18 DIAGNOSIS — Z955 Presence of coronary angioplasty implant and graft: Secondary | ICD-10-CM

## 2018-08-18 DIAGNOSIS — Z8673 Personal history of transient ischemic attack (TIA), and cerebral infarction without residual deficits: Secondary | ICD-10-CM

## 2018-08-18 DIAGNOSIS — W19XXXA Unspecified fall, initial encounter: Secondary | ICD-10-CM | POA: Diagnosis present

## 2018-08-18 DIAGNOSIS — F1410 Cocaine abuse, uncomplicated: Secondary | ICD-10-CM | POA: Diagnosis present

## 2018-08-18 DIAGNOSIS — F10231 Alcohol dependence with withdrawal delirium: Secondary | ICD-10-CM | POA: Diagnosis not present

## 2018-08-18 DIAGNOSIS — S2243XA Multiple fractures of ribs, bilateral, initial encounter for closed fracture: Secondary | ICD-10-CM | POA: Diagnosis present

## 2018-08-18 DIAGNOSIS — G9340 Encephalopathy, unspecified: Secondary | ICD-10-CM | POA: Diagnosis present

## 2018-08-18 DIAGNOSIS — S2232XA Fracture of one rib, left side, initial encounter for closed fracture: Secondary | ICD-10-CM | POA: Diagnosis present

## 2018-08-18 DIAGNOSIS — J9811 Atelectasis: Secondary | ICD-10-CM | POA: Diagnosis present

## 2018-08-18 DIAGNOSIS — R296 Repeated falls: Secondary | ICD-10-CM | POA: Diagnosis present

## 2018-08-18 DIAGNOSIS — Z720 Tobacco use: Secondary | ICD-10-CM | POA: Diagnosis present

## 2018-08-18 DIAGNOSIS — Z66 Do not resuscitate: Secondary | ICD-10-CM | POA: Diagnosis present

## 2018-08-18 DIAGNOSIS — S2249XA Multiple fractures of ribs, unspecified side, initial encounter for closed fracture: Secondary | ICD-10-CM | POA: Diagnosis present

## 2018-08-18 DIAGNOSIS — F10129 Alcohol abuse with intoxication, unspecified: Secondary | ICD-10-CM

## 2018-08-18 DIAGNOSIS — Z951 Presence of aortocoronary bypass graft: Secondary | ICD-10-CM

## 2018-08-18 DIAGNOSIS — Z8546 Personal history of malignant neoplasm of prostate: Secondary | ICD-10-CM

## 2018-08-18 DIAGNOSIS — F1910 Other psychoactive substance abuse, uncomplicated: Secondary | ICD-10-CM | POA: Diagnosis present

## 2018-08-18 DIAGNOSIS — E785 Hyperlipidemia, unspecified: Secondary | ICD-10-CM | POA: Diagnosis present

## 2018-08-18 DIAGNOSIS — E871 Hypo-osmolality and hyponatremia: Secondary | ICD-10-CM | POA: Diagnosis present

## 2018-08-18 DIAGNOSIS — F419 Anxiety disorder, unspecified: Secondary | ICD-10-CM | POA: Diagnosis present

## 2018-08-18 DIAGNOSIS — J9600 Acute respiratory failure, unspecified whether with hypoxia or hypercapnia: Secondary | ICD-10-CM | POA: Diagnosis present

## 2018-08-18 HISTORY — DX: Fracture of one rib, left side, initial encounter for closed fracture: S22.32XA

## 2018-08-18 HISTORY — DX: Cocaine abuse, uncomplicated: F14.10

## 2018-08-18 NOTE — ED Provider Notes (Signed)
[place] EMERGENCY DEPARTMENT Provider Note   CSN: [id number] Arrival date & time: 08/18/18  2313    History   Chief Complaint Chief Complaint  Patient presents with   Shortness of Breath   Chest Pain    HPI [name] Pace is a 68 y.o. male.     Patient with history of alcohol abuse, CAD status post stents, previous stroke, prostate cancer presenting with left-sided chest pain, abdominal pain and shortness of breath.  Patient states this pain is been ongoing for the past 3 days after he is had multiple falls in the past several days.  Describes pain to the left side of his chest and his abdomen that is constant.  He also is complaining of some shortness of breath with some yellow mucus.  EMS was told by family members he is been having chest pain or shortness of breath for the past 8 days.  He is had several falls in the past several days.  Admits to drinking 3 beers today with poor p.o. intake.  Patient states his significant other called EMS today due to shortness of breath and chest pain.  He has pain on the left side of his abdomen but no vomiting or diarrhea.  No pain with urination or blood in the urine.  No known fevers at home.  The history is provided by the patient, the EMS personnel and a relative. The history is limited by the condition of the patient.  Shortness of Breath  Associated symptoms: abdominal pain, chest pain, cough, fever and headaches   Associated symptoms: no rash and no vomiting   Chest Pain  Associated symptoms: abdominal pain, cough, fatigue, fever, headache, shortness of breath and weakness   Associated symptoms: no nausea and no vomiting     Past Medical History:  Diagnosis Date   Alcohol abuse    Anxiety    Breast cancer ([place]) Left breast   Per patient diagnosed in early 62's.    Coronary artery disease    High cholesterol    Prostate cancer ([place])    per patient diagnosed in early 50's   Stroke [facility])      Patient Active Problem List   Diagnosis Date Noted   At risk for adverse drug event 06/12/2018   Palliative care encounter    Encephalopathy acute    Delirium tremens ([place]) 05/18/2018   Malnutrition of moderate degree 05/18/2018   Acute respiratory failure ([place])    Shock circulatory ([place])    Multiple rib fractures 05/14/2018   Pneumothorax, closed, traumatic, initial encounter 05/14/2018   Traumatic fracture of ribs with pneumothorax    Laceration of muscle(s) and tendon(s) of anterior muscle group at lower leg level, right leg, initial encounter 02/10/2016   Traumatic rupture of right anterior tibial tendon 02/10/2016   Hip fracture, left ([place]) 07/02/2014   Closed left hip fracture ([place]) 07/02/2014   Hip fracture ([place]) 07/02/2014   Intertrochanteric fracture of left femur ([place]) 07/02/2014   History of stroke    Preop examination    Subclavian artery stenosis, left ([place]) 06/15/2014   Chest pain 06/14/2014   CAP (community acquired pneumonia) 06/14/2014   Sepsis ([place]) 06/14/2014   Alcohol abuse 06/14/2014   CAD (coronary artery disease) 06/14/2014   Tobacco abuse 06/14/2014    Past Surgical History:  Procedure Laterality Date   CAROTID STENT     CORONARY ARTERY BYPASS GRAFT     FEMUR IM NAIL Left 07/02/2014   Procedure: INTRAMEDULLARY (  IM) NAIL FEMORAL;  Surgeon: Elie Goody, MD;  Location: [facility];  Service: Orthopedics;  Laterality: Left;   I&D EXTREMITY Right 02/10/2016   Procedure: IRRIGATION AND DEBRIDEMENT EXTREMITY;  Surgeon: Dorna Leitz, MD;  Location: [place] [place];  Service: Orthopedics;  Laterality: Right;   TENDON REPAIR Right 02/10/2016   Procedure: Anterior TENDON REPAIR and wound exploration.;  Surgeon: Dorna Leitz, MD;  Location: [place];  Service: Orthopedics;  Laterality: Right;        Home Medications    Prior to Admission medications   Medication Sig Start Date End Date Taking? Authorizing Provider  aspirin EC 325 MG EC  tablet Take 1 tablet (325 mg total) by mouth 2 (two) times daily with a meal. 07/04/14   Debbe Odea, MD  folic acid (FOLVITE) 1 MG tablet Take 1 tablet (1 mg total) by mouth daily. 06/09/18   Debbe Odea, MD  lidocaine (LIDODERM) 5 % Place 2 patches onto the skin daily. Remove & Discard patch within 12 hours or as directed by MD 06/09/18   Debbe Odea, MD  LORazepam (ATIVAN) 0.5 MG tablet Take 1 tablet (0.5 mg total) by mouth every 8 (eight) hours as needed for anxiety. 06/09/18   Debbe Odea, MD  NUTRITIONAL SUPPLEMENT LIQD Take 120 mLs by mouth 2 (two) times daily. [employer]    [provider]  OLANZapine (ZYPREXA) 2.5 MG tablet Take 2 tablets (5 mg total) by mouth at bedtime. 06/09/18   Debbe Odea, MD  OLANZapine (ZYPREXA) 5 MG tablet Take 0.5 tablets (2.5 mg total) by mouth daily as needed (agitation). 06/09/18   Debbe Odea, MD  polyethylene glycol (MIRALAX / GLYCOLAX) packet Take 17 g by mouth daily as needed for moderate constipation or severe constipation. 06/09/18   Debbe Odea, MD  thiamine 100 MG tablet Take 1 tablet (100 mg total) by mouth daily. 06/09/18   Debbe Odea, MD  vitamin B-12 1000 MCG tablet Take 1 tablet (1,000 mcg total) by mouth daily. 06/09/18   Debbe Odea, MD    Family History Family History  Problem Relation Age of Onset   Dementia Mother     Social History Social History   Tobacco Use   Smoking status: Current Every Day Smoker    Packs/day: 1.00    Years: 42.00    Pack years: 42.00    Types: Cigarettes   Smokeless tobacco: Never Used  Substance Use Topics   Alcohol use: Yes    Comment: 4-5 beers a day   Drug use: No     Allergies   Penicillins   Review of Systems Review of Systems  Constitutional: Positive for activity change, appetite change, fatigue and fever.  HENT: Negative for congestion and rhinorrhea.   Eyes: Negative for visual disturbance.  Respiratory: Positive for cough, chest tightness and shortness of breath.    Cardiovascular: Positive for chest pain.  Gastrointestinal: Positive for abdominal pain. Negative for nausea and vomiting.  Genitourinary: Negative for dysuria and hematuria.  Musculoskeletal: Positive for arthralgias and myalgias.  Skin: Negative for rash.  Neurological: Positive for weakness and headaches.    all other systems are negative except as noted in the HPI and PMH.    Physical Exam Updated Vital Signs BP (!) 155/82 (BP Location: Right Arm)    Pulse 91    Temp 100.1 F (37.8 C) (Rectal)    Resp 18    SpO2 96%   Physical Exam Vitals signs and nursing note reviewed.  Constitutional:  General: He is not in acute distress.    Appearance: He is well-developed. He is ill-appearing.     Comments: Disheveled, chronically ill-appearing  HENT:     Head: Normocephalic and atraumatic.     Mouth/Throat:     Pharynx: No oropharyngeal exudate.  Eyes:     Conjunctiva/sclera: Conjunctivae normal.     Pupils: Pupils are equal, round, and reactive to light.  Neck:     Musculoskeletal: Normal range of motion and neck supple.     Comments: No meningismus. Cardiovascular:     Rate and Rhythm: Normal rate and regular rhythm.     Heart sounds: Normal heart sounds. No murmur.  Pulmonary:     Effort: Pulmonary effort is normal. No respiratory distress.     Breath sounds: Normal breath sounds.     Comments: Diminished breath sounds at the bases more diminished on the right.  Tenderness to palpation of left anterior lateral ribs without crepitance or ecchymosis Chest:     Chest wall: Tenderness present.  Abdominal:     Palpations: Abdomen is soft.     Tenderness: There is abdominal tenderness. There is no guarding or rebound.     Comments: Tenderness to left abdomen without ecchymosis or bruising  Musculoskeletal: Normal range of motion.        General: No tenderness.     Comments: No midline T or L-spine tenderness  Skin:    General: Skin is warm.     Capillary Refill:  Capillary refill takes less than 2 seconds.  Neurological:     General: No focal deficit present.     Mental Status: He is alert and oriented to person, place, and time.     Cranial Nerves: No cranial nerve deficit.     Motor: No abnormal muscle tone.     Coordination: Coordination normal.     Comments: 5/5 strength throughout, cranial 2-12 intact, mildly tremulous  Psychiatric:        Behavior: Behavior normal.      ED Treatments / Results  Labs (all labs ordered are listed, but only abnormal results are displayed) Labs Reviewed  CBC WITH DIFFERENTIAL/PLATELET - Abnormal; Notable for the following components:      Result Value   WBC 15.4 (*)    Neutro Abs 12.2 (*)    Monocytes Absolute 1.7 (*)    Abs Immature Granulocytes 0.16 (*)    All other components within normal limits  COMPREHENSIVE METABOLIC PANEL - Abnormal; Notable for the following components:   Sodium 120 (*)    Potassium 3.3 (*)    Chloride 84 (*)    CO2 17 (*)    BUN 6 (*)    Calcium 7.9 (*)    Total Protein 6.3 (*)    Albumin 2.6 (*)    Anion gap 19 (*)    All other components within normal limits  ETHANOL - Abnormal; Notable for the following components:   Alcohol, Ethyl (B) 54 (*)    All other components within normal limits  URINALYSIS, ROUTINE W REFLEX MICROSCOPIC - Abnormal; Notable for the following components:   Specific Gravity, Urine 1.003 (*)    Hgb urine dipstick MODERATE (*)    Leukocytes,Ua MODERATE (*)    Bacteria, UA RARE (*)    All other components within normal limits  RAPID URINE DRUG SCREEN, HOSP PERFORMED - Abnormal; Notable for the following components:   Cocaine POSITIVE (*)    All other components within normal limits  LACTIC  ACID, PLASMA - Abnormal; Notable for the following components:   Lactic Acid, Venous 2.4 (*)    All other components within normal limits  TROPONIN I - Abnormal; Notable for the following components:   Troponin I 0.04 (*)    All other components within  normal limits  COMPREHENSIVE METABOLIC PANEL - Abnormal; Notable for the following components:   Sodium 126 (*)    Potassium 3.0 (*)    Chloride 87 (*)    BUN 5 (*)    Calcium 7.9 (*)    Total Protein 5.9 (*)    Albumin 2.5 (*)    All other components within normal limits  CBC - Abnormal; Notable for the following components:   WBC 15.6 (*)    All other components within normal limits  BRAIN NATRIURETIC PEPTIDE - Abnormal; Notable for the following components:   B Natriuretic Peptide 172.4 (*)    All other components within normal limits  CULTURE, BLOOD (ROUTINE X 2)  CULTURE, BLOOD (ROUTINE X 2)  TROPONIN I  LIPASE, BLOOD  INFLUENZA PANEL BY PCR (TYPE A & B)  CK  LACTIC ACID, PLASMA  MAGNESIUM  PHOSPHORUS  COMPREHENSIVE METABOLIC PANEL  MAGNESIUM  PHOSPHORUS  CBC WITH DIFFERENTIAL/PLATELET  LACTIC ACID, PLASMA    EKG EKG Interpretation  Date/Time:  Saturday August 18 2018 23:22:57 EDT Ventricular Rate:  90 PR Interval:    QRS Duration: 114 QT Interval:  354 QTC Calculation: 434 R Axis:   81 Text Interpretation:  Sinus rhythm Ventricular premature complex Probable left atrial enlargement Borderline intraventricular conduction delay Repol abnrm suggests ischemia, diffuse leads Minimal ST elevation, anterior leads No significant change was found Confirmed by Ezequiel Essex [phone]) on 08/18/2018 11:32:00 PM   Radiology Ct Head Wo Contrast  Result Date: 08/19/2018 CLINICAL DATA:  History of multiple falls EXAM: CT HEAD WITHOUT CONTRAST TECHNIQUE: Contiguous axial images were obtained from the base of the skull through the vertex without intravenous contrast. COMPARISON:  MRI 06/04/2018, CT brain 05/30/2018 FINDINGS: Brain: No acute territorial infarction, hemorrhage, or intracranial mass. Mild to moderate atrophy. Moderate small vessel ischemic changes of the white matter. Chronic infarcts in the right thalamus and basal ganglia. Stable ventricle size Vascular: No  hyperdense vessels.  Carotid vascular calcification Skull: Normal. Negative for fracture or focal lesion. Sinuses/Orbits: No acute finding. Mucosal thickening in the sphenoid sinuses Other: None IMPRESSION: 1. No CT evidence for acute intracranial abnormality. 2. Atrophy and small vessel ischemic changes of the white matter Electronically Signed   By: Donavan Foil M.D.   On: 08/19/2018 02:55   Ct Chest W Contrast  Result Date: 08/19/2018 CLINICAL DATA:  Fall. Blunt chest trauma. EXAM: CT CHEST, ABDOMEN, AND PELVIS WITH CONTRAST TECHNIQUE: Multidetector CT imaging of the chest, abdomen and pelvis was performed following the standard protocol during bolus administration of intravenous contrast. CONTRAST:  153mL OMNIPAQUE IOHEXOL 300 MG/ML  SOLN COMPARISON:  CT abdomen pelvis 11/16/2010 FINDINGS: CT CHEST FINDINGS Cardiovascular: There are coronary artery and aortic atherosclerotic calcifications. There is a stent within the proximal left subclavian artery, but visualization of the stent lumen is limited. The subclavian artery distal to the stent is patent. Mediastinum/Nodes: No mediastinal hematoma. No mediastinal, hilar or axillary lymphadenopathy. Normal thyroid Lungs/Pleura: There is atelectasis at the right lung base. No pneumothorax or pulmonary contusion. Musculoskeletal: There are acute fractures of the left seventh, eighth and ninth ribs. There are multiple old right-sided rib fractures. Many of these are displaced and nonunited. No acute rib  fracture. CT ABDOMEN PELVIS FINDINGS Hepatobiliary: No hepatic hematoma or laceration. No biliary dilatation. Status post cholecystectomy. Pancreas: Normal contours without ductal dilatation. No peripancreatic fluid collection. Spleen: No splenic laceration or hematoma. Adrenals/Urinary Tract: --Adrenal glands: No adrenal hemorrhage. --Right kidney/ureter: No hydronephrosis or perinephric hematoma. --Left kidney/ureter: No hydronephrosis or perinephric hematoma.  --Urinary bladder: Unremarkable. Stomach/Bowel: --Stomach/Duodenum: No hiatal hernia or other gastric abnormality. Normal duodenal course and caliber. --Small bowel: No dilatation or inflammation. --Colon: No focal abnormality. --Appendix: Not visualized. No right lower quadrant inflammation or free fluid. Vascular/Lymphatic: Atherosclerotic calcification is present within the non-aneurysmal abdominal aorta, without hemodynamically significant stenosis. No abdominal or pelvic lymphadenopathy. Reproductive: Unremarkable Musculoskeletal. Multilevel degenerative disc disease and facet arthrosis. No bony spinal canal stenosis. Other: None. IMPRESSION: 1. Acute fractures of the left seventh, eighth and ninth ribs. No pneumothorax or pulmonary contusion. 2. Multiple old right rib fractures, some which remain displaced and non-united. 3. Coronary artery and Aortic Atherosclerosis (ICD10-I70.0). Electronically Signed   By: Ulyses Jarred M.D.   On: 08/19/2018 02:58   Ct Abdomen Pelvis W Contrast  Result Date: 08/19/2018 CLINICAL DATA:  Fall. Blunt chest trauma. EXAM: CT CHEST, ABDOMEN, AND PELVIS WITH CONTRAST TECHNIQUE: Multidetector CT imaging of the chest, abdomen and pelvis was performed following the standard protocol during bolus administration of intravenous contrast. CONTRAST:  153mL OMNIPAQUE IOHEXOL 300 MG/ML  SOLN COMPARISON:  CT abdomen pelvis 11/16/2010 FINDINGS: CT CHEST FINDINGS Cardiovascular: There are coronary artery and aortic atherosclerotic calcifications. There is a stent within the proximal left subclavian artery, but visualization of the stent lumen is limited. The subclavian artery distal to the stent is patent. Mediastinum/Nodes: No mediastinal hematoma. No mediastinal, hilar or axillary lymphadenopathy. Normal thyroid Lungs/Pleura: There is atelectasis at the right lung base. No pneumothorax or pulmonary contusion. Musculoskeletal: There are acute fractures of the left seventh, eighth and ninth  ribs. There are multiple old right-sided rib fractures. Many of these are displaced and nonunited. No acute rib fracture. CT ABDOMEN PELVIS FINDINGS Hepatobiliary: No hepatic hematoma or laceration. No biliary dilatation. Status post cholecystectomy. Pancreas: Normal contours without ductal dilatation. No peripancreatic fluid collection. Spleen: No splenic laceration or hematoma. Adrenals/Urinary Tract: --Adrenal glands: No adrenal hemorrhage. --Right kidney/ureter: No hydronephrosis or perinephric hematoma. --Left kidney/ureter: No hydronephrosis or perinephric hematoma. --Urinary bladder: Unremarkable. Stomach/Bowel: --Stomach/Duodenum: No hiatal hernia or other gastric abnormality. Normal duodenal course and caliber. --Small bowel: No dilatation or inflammation. --Colon: No focal abnormality. --Appendix: Not visualized. No right lower quadrant inflammation or free fluid. Vascular/Lymphatic: Atherosclerotic calcification is present within the non-aneurysmal abdominal aorta, without hemodynamically significant stenosis. No abdominal or pelvic lymphadenopathy. Reproductive: Unremarkable Musculoskeletal. Multilevel degenerative disc disease and facet arthrosis. No bony spinal canal stenosis. Other: None. IMPRESSION: 1. Acute fractures of the left seventh, eighth and ninth ribs. No pneumothorax or pulmonary contusion. 2. Multiple old right rib fractures, some which remain displaced and non-united. 3. Coronary artery and Aortic Atherosclerosis (ICD10-I70.0). Electronically Signed   By: Ulyses Jarred M.D.   On: 08/19/2018 02:58   Dg Chest Portable 1 View  Result Date: 08/18/2018 CLINICAL DATA:  Left chest pain for the past 8 days and shortness of breath for the past 10 days. [place] 3 times in the last 2 days. Possible alcohol and drug use today. EXAM: PORTABLE CHEST 1 VIEW COMPARISON:  05/29/2018. FINDINGS: Normal sized heart. Previously demonstrated multiple right rib fractures without interval healing. There is a  healing right posterior 5th rib fracture. Mild right basilar atelectasis or scarring. Stable  post CABG changes. Moderate right shoulder degenerative changes and fixation screw. Left proximal common carotid artery stent. IMPRESSION: 1. Healing and non healed right rib fractures. 2. Mild right basilar atelectasis or scarring. Electronically Signed   By: Claudie Revering M.D.   On: 08/18/2018 23:54    Procedures Procedures (including critical care time)  Medications Ordered in ED Medications - No data to display   Initial Impression / Assessment and Plan / ED Course  I have reviewed the triage vital signs and the nursing notes.  Pertinent labs & imaging results that were available during my care of the patient were reviewed by me and considered in my medical decision making (see chart for details).       3 days of constant left-sided chest pain and shortness of breath.  EKG is unchanged.  Diminished breath sounds on the right with a temperature of 100.1 rectally.  X-ray shows no pneumonia or pneumothorax.  Rectal temp 100.1.  Patient with mild tachypnea.  EKG is sinus rhythm without acute ST changes.  Work-up is remarkable for leukocytosis but no infiltrate seen on chest x-ray.  CT scans were obtained given his history of repeated trauma with multiple falls.  Hyponatremia noted with negative troponin.  CT shows multiple left-sided rib fractures without pneumothorax and no intra-abdominal pathology.  CIWA protocol started.  Patient developed significant alcohol withdrawal during previous hospitalization in January and required Precedex.  Patient sleeping on reassessment.  Mildly tachypneic but not hypoxic. No clear indication for antibiotics as urinalysis is negative and chest x-ray is negative.  Flu swab negative.  Plan admission for hydration in setting of hyponatremia and beer potomania. Chest pain appears atypical for ACS.  Second troponin pending.  Admission discussed with Dr.  Sherral Hammers.  CRITICAL CARE Performed by: Ezequiel Essex Total critical care time: 45 minutes Critical care time was exclusive of separately billable procedures and treating other patients. Critical care was necessary to treat or prevent imminent or life-threatening deterioration. Critical care was time spent personally by me on the following activities: development of treatment plan with patient and/or surrogate as well as nursing, discussions with consultants, evaluation of patient's response to treatment, examination of patient, obtaining history from patient or surrogate, ordering and performing treatments and interventions, ordering and review of laboratory studies, ordering and review of radiographic studies, pulse oximetry and re-evaluation of patient's condition.   Final Clinical Impressions(s) / ED Diagnoses   Final diagnoses:  Closed fracture of multiple ribs of left side, initial encounter  Hyponatremia  Alcohol abuse  Atypical chest pain    ED Discharge Orders    None       Bakary Bramer, Annie Main, MD 08/19/18 [phone]

## 2018-08-18 NOTE — ED Notes (Signed)
Attempted blood draw unsuccessful. RN informed

## 2018-08-18 NOTE — ED Triage Notes (Addendum)
Pt. BIB EMS c/o left side chest pain past 8 days and SOB for past 10 dayas. Pt. States he has falling x3 in last couple of days. Possible ETOH and drug use today. Pt. States he drank 3 beers.   HX of recent falls and pneumonia. Per EMS, pt. Has not eaten past few days.

## 2018-08-19 ENCOUNTER — Emergency Department (HOSPITAL_COMMUNITY): Payer: Medicare Other

## 2018-08-19 ENCOUNTER — Other Ambulatory Visit: Payer: Self-pay

## 2018-08-19 ENCOUNTER — Encounter (HOSPITAL_COMMUNITY): Payer: Self-pay | Admitting: Internal Medicine

## 2018-08-19 DIAGNOSIS — S2242XA Multiple fractures of ribs, left side, initial encounter for closed fracture: Secondary | ICD-10-CM

## 2018-08-19 DIAGNOSIS — G9340 Encephalopathy, unspecified: Secondary | ICD-10-CM

## 2018-08-19 DIAGNOSIS — Z7982 Long term (current) use of aspirin: Secondary | ICD-10-CM | POA: Diagnosis not present

## 2018-08-19 DIAGNOSIS — F10129 Alcohol abuse with intoxication, unspecified: Secondary | ICD-10-CM

## 2018-08-19 DIAGNOSIS — Z515 Encounter for palliative care: Secondary | ICD-10-CM | POA: Diagnosis not present

## 2018-08-19 DIAGNOSIS — Z66 Do not resuscitate: Secondary | ICD-10-CM | POA: Diagnosis present

## 2018-08-19 DIAGNOSIS — Z720 Tobacco use: Secondary | ICD-10-CM

## 2018-08-19 DIAGNOSIS — E876 Hypokalemia: Secondary | ICD-10-CM | POA: Diagnosis present

## 2018-08-19 DIAGNOSIS — W19XXXA Unspecified fall, initial encounter: Secondary | ICD-10-CM | POA: Diagnosis present

## 2018-08-19 DIAGNOSIS — F10231 Alcohol dependence with withdrawal delirium: Principal | ICD-10-CM

## 2018-08-19 DIAGNOSIS — R296 Repeated falls: Secondary | ICD-10-CM | POA: Diagnosis present

## 2018-08-19 DIAGNOSIS — Z8546 Personal history of malignant neoplasm of prostate: Secondary | ICD-10-CM | POA: Diagnosis not present

## 2018-08-19 DIAGNOSIS — F1010 Alcohol abuse, uncomplicated: Secondary | ICD-10-CM

## 2018-08-19 DIAGNOSIS — S2232XA Fracture of one rib, left side, initial encounter for closed fracture: Secondary | ICD-10-CM | POA: Diagnosis not present

## 2018-08-19 DIAGNOSIS — F17210 Nicotine dependence, cigarettes, uncomplicated: Secondary | ICD-10-CM | POA: Diagnosis present

## 2018-08-19 DIAGNOSIS — S2243XA Multiple fractures of ribs, bilateral, initial encounter for closed fracture: Secondary | ICD-10-CM | POA: Diagnosis present

## 2018-08-19 DIAGNOSIS — E785 Hyperlipidemia, unspecified: Secondary | ICD-10-CM | POA: Diagnosis present

## 2018-08-19 DIAGNOSIS — Z853 Personal history of malignant neoplasm of breast: Secondary | ICD-10-CM | POA: Diagnosis not present

## 2018-08-19 DIAGNOSIS — G92 Toxic encephalopathy: Secondary | ICD-10-CM | POA: Diagnosis not present

## 2018-08-19 DIAGNOSIS — G9341 Metabolic encephalopathy: Secondary | ICD-10-CM | POA: Diagnosis present

## 2018-08-19 DIAGNOSIS — E7800 Pure hypercholesterolemia, unspecified: Secondary | ICD-10-CM | POA: Diagnosis present

## 2018-08-19 DIAGNOSIS — R0789 Other chest pain: Secondary | ICD-10-CM

## 2018-08-19 DIAGNOSIS — Z8673 Personal history of transient ischemic attack (TIA), and cerebral infarction without residual deficits: Secondary | ICD-10-CM

## 2018-08-19 DIAGNOSIS — E871 Hypo-osmolality and hyponatremia: Secondary | ICD-10-CM | POA: Diagnosis not present

## 2018-08-19 DIAGNOSIS — F1910 Other psychoactive substance abuse, uncomplicated: Secondary | ICD-10-CM

## 2018-08-19 DIAGNOSIS — Z79810 Long term (current) use of selective estrogen receptor modulators (SERMs): Secondary | ICD-10-CM | POA: Diagnosis not present

## 2018-08-19 DIAGNOSIS — F419 Anxiety disorder, unspecified: Secondary | ICD-10-CM | POA: Diagnosis present

## 2018-08-19 DIAGNOSIS — Z955 Presence of coronary angioplasty implant and graft: Secondary | ICD-10-CM | POA: Diagnosis not present

## 2018-08-19 DIAGNOSIS — J9811 Atelectasis: Secondary | ICD-10-CM | POA: Diagnosis present

## 2018-08-19 DIAGNOSIS — I2510 Atherosclerotic heart disease of native coronary artery without angina pectoris: Secondary | ICD-10-CM

## 2018-08-19 DIAGNOSIS — Z951 Presence of aortocoronary bypass graft: Secondary | ICD-10-CM | POA: Diagnosis not present

## 2018-08-19 DIAGNOSIS — F1410 Cocaine abuse, uncomplicated: Secondary | ICD-10-CM | POA: Diagnosis not present

## 2018-08-19 DIAGNOSIS — Z79899 Other long term (current) drug therapy: Secondary | ICD-10-CM | POA: Diagnosis not present

## 2018-08-19 DIAGNOSIS — Z880 Allergy status to penicillin: Secondary | ICD-10-CM | POA: Diagnosis not present

## 2018-08-19 DIAGNOSIS — S2241XA Multiple fractures of ribs, right side, initial encounter for closed fracture: Secondary | ICD-10-CM | POA: Diagnosis not present

## 2018-08-19 HISTORY — DX: Cocaine abuse, uncomplicated: F14.10

## 2018-08-19 HISTORY — DX: Fracture of one rib, left side, initial encounter for closed fracture: S22.32XA

## 2018-08-19 LAB — COMPREHENSIVE METABOLIC PANEL
ALT: 16 U/L (ref 0–44)
ALT: 16 U/L (ref 0–44)
AST: 23 U/L (ref 15–41)
AST: 18 U/L (ref 15–41)
Albumin: 2.6 g/dL — ABNORMAL LOW (ref 3.5–5.0)
Albumin: 2.5 g/dL — ABNORMAL LOW (ref 3.5–5.0)
Alkaline Phosphatase: 106 U/L (ref 38–126)
Alkaline Phosphatase: 101 U/L (ref 38–126)
Anion gap: 19 — ABNORMAL HIGH (ref 5–15)
Anion gap: 14 (ref 5–15)
BUN: 6 mg/dL — ABNORMAL LOW (ref 8–23)
BUN: 5 mg/dL — ABNORMAL LOW (ref 8–23)
CO2: 17 mmol/L — ABNORMAL LOW (ref 22–32)
CO2: 25 mmol/L (ref 22–32)
Calcium: 7.9 mg/dL — ABNORMAL LOW (ref 8.9–10.3)
Calcium: 7.9 mg/dL — ABNORMAL LOW (ref 8.9–10.3)
Chloride: 84 mmol/L — ABNORMAL LOW (ref 98–111)
Chloride: 87 mmol/L — ABNORMAL LOW (ref 98–111)
Creatinine, Ser: 0.62 mg/dL (ref 0.61–1.24)
Creatinine, Ser: 0.8 mg/dL (ref 0.61–1.24)
GFR calc Af Amer: >60 mL/min (ref >60)
GFR calc Af Amer: >60 mL/min (ref >60)
GFR calc non Af Amer: >60 mL/min (ref >60)
GFR calc non Af Amer: >60 mL/min (ref >60)
Glucose, Bld: 88 mg/dL (ref 70–99)
Glucose, Bld: 98 mg/dL (ref 70–99)
POTASSIUM: 3.3 mmol/L — AB (ref 3.5–5.1)
Potassium: 3 mmol/L — ABNORMAL LOW (ref 3.5–5.1)
Sodium: 120 mmol/L — ABNORMAL LOW (ref 135–145)
Sodium: 126 mmol/L — ABNORMAL LOW (ref 135–145)
Total Bilirubin: 0.5 mg/dL (ref 0.3–1.2)
Total Bilirubin: 0.7 mg/dL (ref 0.3–1.2)
Total Protein: 6.3 g/dL — ABNORMAL LOW (ref 6.5–8.1)
Total Protein: 5.9 g/dL — ABNORMAL LOW (ref 6.5–8.1)

## 2018-08-19 LAB — CBC WITH DIFFERENTIAL/PLATELET
Abs Immature Granulocytes: 0.16 10*3/uL — ABNORMAL HIGH (ref 0.00–0.07)
Abs Immature Granulocytes: 0.18 10*3/uL — ABNORMAL HIGH (ref 0.00–0.07)
BASOS ABS: 0.1 10*3/uL (ref 0.0–0.1)
Basophils Absolute: 0.1 10*3/uL (ref 0.0–0.1)
Basophils Relative: 0 %
Basophils Relative: 0 %
EOS PCT: 1 %
Eosinophils Absolute: 0 10*3/uL (ref 0.0–0.5)
Eosinophils Absolute: 0.1 10*3/uL (ref 0.0–0.5)
Eosinophils Relative: 0 %
HCT: 44.1 % (ref 39.0–52.0)
HCT: 45.5 % (ref 39.0–52.0)
Hemoglobin: 15.6 g/dL (ref 13.0–17.0)
Hemoglobin: 15.9 g/dL (ref 13.0–17.0)
IMMATURE GRANULOCYTES: 1 %
Immature Granulocytes: 1 %
Lymphocytes Relative: 7 %
Lymphocytes Relative: 8 %
Lymphs Abs: 1 10*3/uL (ref 0.7–4.0)
Lymphs Abs: 1.2 10*3/uL (ref 0.7–4.0)
MCH: 32.8 pg (ref 26.0–34.0)
MCH: 32.8 pg (ref 26.0–34.0)
MCHC: 34.9 g/dL (ref 30.0–36.0)
MCHC: 35.4 g/dL (ref 30.0–36.0)
MCV: 92.6 fL (ref 80.0–100.0)
MCV: 93.8 fL (ref 80.0–100.0)
MONOS PCT: 11 %
Monocytes Absolute: 1.7 10*3/uL — ABNORMAL HIGH (ref 0.1–1.0)
Monocytes Absolute: 2 10*3/uL — ABNORMAL HIGH (ref 0.1–1.0)
Monocytes Relative: 14 %
Neutro Abs: 11.5 10*3/uL — ABNORMAL HIGH (ref 1.7–7.7)
Neutro Abs: 12.2 10*3/uL — ABNORMAL HIGH (ref 1.7–7.7)
Neutrophils Relative %: 78 %
Neutrophils Relative %: 79 %
PLATELETS: 257 10*3/uL (ref 150–400)
Platelets: 267 10*3/uL (ref 150–400)
RBC: 4.76 MIL/uL (ref 4.22–5.81)
RBC: 4.85 MIL/uL (ref 4.22–5.81)
RDW: 13.3 % (ref 11.5–15.5)
RDW: 13.5 % (ref 11.5–15.5)
WBC: 14.8 10*3/uL — ABNORMAL HIGH (ref 4.0–10.5)
WBC: 15.4 10*3/uL — ABNORMAL HIGH (ref 4.0–10.5)
nRBC: 0 % (ref 0.0–0.2)
nRBC: 0 % (ref 0.0–0.2)

## 2018-08-19 LAB — CBC
HEMATOCRIT: 44.5 % (ref 39.0–52.0)
Hemoglobin: 16 g/dL (ref 13.0–17.0)
MCH: 33.6 pg (ref 26.0–34.0)
MCHC: 36 g/dL (ref 30.0–36.0)
MCV: 93.5 fL (ref 80.0–100.0)
Platelets: 277 10*3/uL (ref 150–400)
RBC: 4.76 MIL/uL (ref 4.22–5.81)
RDW: 13.5 % (ref 11.5–15.5)
WBC: 15.6 10*3/uL — ABNORMAL HIGH (ref 4.0–10.5)
nRBC: 0 % (ref 0.0–0.2)

## 2018-08-19 LAB — MAGNESIUM: Magnesium: 2 mg/dL (ref 1.7–2.4)

## 2018-08-19 LAB — TROPONIN I
Troponin I: <0.03 ng/mL (ref <0.03)
Troponin I: 0.04 ng/mL (ref <0.03)

## 2018-08-19 LAB — URINALYSIS, ROUTINE W REFLEX MICROSCOPIC
Bilirubin Urine: NEGATIVE
Glucose, UA: NEGATIVE mg/dL
Ketones, ur: NEGATIVE mg/dL
Nitrite: NEGATIVE
PH: 6 (ref 5.0–8.0)
Protein, ur: NEGATIVE mg/dL
Specific Gravity, Urine: 1.003 — ABNORMAL LOW (ref 1.005–1.030)

## 2018-08-19 LAB — BASIC METABOLIC PANEL
Anion gap: 11 (ref 5–15)
BUN: 7 mg/dL — ABNORMAL LOW (ref 8–23)
CO2: 15 mmol/L — ABNORMAL LOW (ref 22–32)
Calcium: 7.3 mg/dL — ABNORMAL LOW (ref 8.9–10.3)
Chloride: 96 mmol/L — ABNORMAL LOW (ref 98–111)
Creatinine, Ser: 0.74 mg/dL (ref 0.61–1.24)
GFR calc Af Amer: >60 mL/min (ref >60)
GFR calc non Af Amer: >60 mL/min (ref >60)
Glucose, Bld: 91 mg/dL (ref 70–99)
Potassium: 4.1 mmol/L (ref 3.5–5.1)
Sodium: 122 mmol/L — ABNORMAL LOW (ref 135–145)

## 2018-08-19 LAB — LIPASE, BLOOD: LIPASE: 31 U/L (ref 11–51)

## 2018-08-19 LAB — RAPID URINE DRUG SCREEN, HOSP PERFORMED
Amphetamines: NOT DETECTED
Barbiturates: NOT DETECTED
Benzodiazepines: NOT DETECTED
Cocaine: POSITIVE — AB
Opiates: NOT DETECTED
Tetrahydrocannabinol: NOT DETECTED

## 2018-08-19 LAB — PHOSPHORUS: Phosphorus: 3.8 mg/dL (ref 2.5–4.6)

## 2018-08-19 LAB — ETHANOL: ALCOHOL ETHYL (B): 54 mg/dL — AB (ref <10)

## 2018-08-19 LAB — INFLUENZA PANEL BY PCR (TYPE A & B)
Influenza A By PCR: NEGATIVE
Influenza B By PCR: NEGATIVE

## 2018-08-19 LAB — LACTIC ACID, PLASMA
Lactic Acid, Venous: 1.9 mmol/L (ref 0.5–1.9)
Lactic Acid, Venous: 2.4 mmol/L (ref 0.5–1.9)

## 2018-08-19 LAB — CK: Total CK: 52 U/L (ref 49–397)

## 2018-08-19 LAB — BRAIN NATRIURETIC PEPTIDE: B Natriuretic Peptide: 172.4 pg/mL — ABNORMAL HIGH (ref 0.0–100.0)

## 2018-08-19 MED ORDER — FOLIC ACID 1 MG PO TABS: 1.0000 mg | ORAL_TABLET | Freq: Every day | ORAL | Status: DC

## 2018-08-19 MED ORDER — TRAMADOL HCL 50 MG PO TABS: 50.0000 mg | ORAL_TABLET | Freq: Four times a day (QID) | ORAL | Status: DC | PRN

## 2018-08-19 MED ORDER — ACETAMINOPHEN 325 MG PO TABS: 650.0000 mg | ORAL_TABLET | Freq: Four times a day (QID) | ORAL | Status: DC | PRN

## 2018-08-19 MED ORDER — ASPIRIN EC 81 MG PO TBEC
81.0000 mg | DELAYED_RELEASE_TABLET | Freq: Every day | ORAL | Status: DC
  Administered 2018-08-20 – 2018-08-21 (×2): 81 mg via ORAL
  Filled 2018-08-19 (×2): qty 1

## 2018-08-19 MED ORDER — SODIUM CHLORIDE 0.9 % IV SOLN
INTRAVENOUS | Status: DC
  Administered 2018-08-19: 19:00:00 via INTRAVENOUS

## 2018-08-19 MED ORDER — ASPIRIN EC 325 MG PO TBEC
325.0000 mg | DELAYED_RELEASE_TABLET | Freq: Two times a day (BID) | ORAL | Status: DC
  Administered 2018-08-20 – 2018-08-21 (×3): 325 mg via ORAL
  Filled 2018-08-19 (×3): qty 1

## 2018-08-19 MED ORDER — OLANZAPINE 5 MG PO TABS
5.0000 mg | ORAL_TABLET | Freq: Every day | ORAL | Status: DC
  Administered 2018-08-19 – 2018-08-20 (×2): 5 mg via ORAL
  Filled 2018-08-19 (×2): qty 1

## 2018-08-19 MED ORDER — THIAMINE HCL 100 MG/ML IJ SOLN: 100.0000 mg | Freq: Every day | INTRAMUSCULAR | Status: DC

## 2018-08-19 MED ORDER — LORAZEPAM 2 MG/ML IJ SOLN: 1.0000 mg | Freq: Four times a day (QID) | INTRAMUSCULAR | Status: DC | PRN

## 2018-08-19 MED ORDER — LORAZEPAM 2 MG/ML IJ SOLN: 0.0000 mg | Freq: Two times a day (BID) | INTRAMUSCULAR | Status: DC

## 2018-08-19 MED ORDER — ACETAMINOPHEN 650 MG RE SUPP: 650.0000 mg | Freq: Four times a day (QID) | RECTAL | Status: DC | PRN

## 2018-08-19 MED ORDER — FOLIC ACID 5 MG/ML IJ SOLN
1.0000 mg | Freq: Every day | INTRAMUSCULAR | Status: DC
  Filled 2018-08-19 (×2): qty 0.2

## 2018-08-19 MED ORDER — LORAZEPAM 2 MG/ML IJ SOLN
0.0000 mg | Freq: Four times a day (QID) | INTRAMUSCULAR | Status: DC
  Administered 2018-08-19: 2 mg via INTRAVENOUS
  Filled 2018-08-19: qty 1

## 2018-08-19 MED ORDER — POTASSIUM CHLORIDE 10 MEQ/100ML IV SOLN
10.0000 meq | INTRAVENOUS | Status: DC
  Administered 2018-08-19: 10 meq via INTRAVENOUS
  Filled 2018-08-19 (×2): qty 100

## 2018-08-19 MED ORDER — ADULT MULTIVITAMIN W/MINERALS CH
1.0000 | ORAL_TABLET | Freq: Every day | ORAL | Status: DC
  Administered 2018-08-20 – 2018-08-21 (×2): 1 via ORAL
  Filled 2018-08-19 (×2): qty 1

## 2018-08-19 MED ORDER — THIAMINE HCL 100 MG/ML IJ SOLN
Freq: Once | INTRAVENOUS | Status: AC
  Administered 2018-08-19: 06:00:00 via INTRAVENOUS
  Filled 2018-08-19: qty 1000

## 2018-08-19 MED ORDER — LORAZEPAM 0.5 MG PO TABS
0.5000 mg | ORAL_TABLET | Freq: Three times a day (TID) | ORAL | Status: DC | PRN
  Administered 2018-08-20: 0.5 mg via ORAL
  Filled 2018-08-19: qty 1

## 2018-08-19 MED ORDER — POTASSIUM CHLORIDE CRYS ER 20 MEQ PO TBCR
40.0000 meq | EXTENDED_RELEASE_TABLET | Freq: Two times a day (BID) | ORAL | Status: AC
  Administered 2018-08-19: 40 meq via ORAL
  Filled 2018-08-19: qty 2

## 2018-08-19 MED ORDER — VITAMIN B-1 100 MG PO TABS
100.0000 mg | ORAL_TABLET | Freq: Every day | ORAL | Status: DC
  Administered 2018-08-20 – 2018-08-21 (×2): 100 mg via ORAL
  Filled 2018-08-19 (×2): qty 1

## 2018-08-19 MED ORDER — LORAZEPAM 2 MG/ML IJ SOLN: 2.0000 mg | INTRAMUSCULAR | Status: DC | PRN

## 2018-08-19 MED ORDER — LORAZEPAM 1 MG PO TABS: 1.0000 mg | ORAL_TABLET | Freq: Four times a day (QID) | ORAL | Status: DC | PRN

## 2018-08-19 MED ORDER — IOHEXOL 300 MG/ML  SOLN
100.0000 mL | Freq: Once | INTRAMUSCULAR | Status: AC | PRN
  Administered 2018-08-19: 100 mL via INTRAVENOUS

## 2018-08-19 MED ORDER — ENOXAPARIN SODIUM 40 MG/0.4ML ~~LOC~~ SOLN
40.0000 mg | Freq: Every day | SUBCUTANEOUS | Status: DC
  Administered 2018-08-19 – 2018-08-21 (×3): 40 mg via SUBCUTANEOUS
  Filled 2018-08-19 (×3): qty 0.4

## 2018-08-19 MED ORDER — SODIUM CHLORIDE 0.9 % IV BOLUS
1000.0000 mL | Freq: Once | INTRAVENOUS | Status: AC
  Administered 2018-08-19: 1000 mL via INTRAVENOUS

## 2018-08-19 MED ORDER — OLANZAPINE 2.5 MG PO TABS
2.5000 mg | ORAL_TABLET | Freq: Every day | ORAL | Status: DC | PRN
  Filled 2018-08-19: qty 1

## 2018-08-19 MED ORDER — POLYETHYLENE GLYCOL 3350 17 G PO PACK
17.0000 g | PACK | Freq: Every day | ORAL | Status: DC
  Administered 2018-08-19 – 2018-08-20 (×2): 17 g via ORAL
  Filled 2018-08-19 (×2): qty 1

## 2018-08-19 NOTE — Consult Note (Addendum)
Palliative Medicine   Name: [name] Pace Date: 08/19/2018 MRN: [id number]  DOB: 14-Mar-1951  Patient Care Team: System, Provider Not In as PCP - General    REASON FOR CONSULTATION: Palliative Care consult requested for this 68 y.o. male with multiple medical problems including alcohol and cocaine abuse,  history of recurrent falls, history of withdrawal seizures, CAD, history of CVA, who was hospitalized 05/14/2018 to 06/09/2018 with right chest wall pain following a fall at home.  Patient was found to have multiple right rib fractures and a right-sided pneumothorax.    He later developed acute respiratory failure and alcohol withdrawal requiring intubation on 12/13.  He self extubated on 12/18.  Hospitalization was further complicated by persistent delirium, agitation, and dysphasia.  Patient was ultimately discharged to rehab but left there AMA.  He is now readmitted 08/19/2018 with chest pain and shortness of breath.  CT of the chest reveals multiple left-sided acute rib fractures and multiple chronic displaced rib fractures without evidence of pneumothorax.  He was noted to  have UDS positive for cocaine and serum toxicology positive for alcohol.  Palliative care was consulted to help address goals.  SOCIAL HISTORY:    Patient was living in an apartment with a girlfriend. Patient formerly worked as a Dealer. However, daughter says that patient was mostly supported by his parents and then uncle. He has been disabled over 13 years.   ADVANCE DIRECTIVES:  Not on file  CODE STATUS: DNR  PAST MEDICAL HISTORY: Past Medical History:  Diagnosis Date   Alcohol abuse    Anxiety    Breast cancer ([place]) Left breast   Per patient diagnosed in early 64's.    Cocaine abuse ([place]) 08/19/2018   Coronary artery disease    High cholesterol    Left rib fracture 08/19/2018   Prostate cancer ([place])    per patient diagnosed in early 50's   Stroke [facility])     PAST SURGICAL HISTORY:  Past  Surgical History:  Procedure Laterality Date   CAROTID STENT     CORONARY ARTERY BYPASS GRAFT     FEMUR IM NAIL Left 07/02/2014   Procedure: INTRAMEDULLARY (IM) NAIL FEMORAL;  Surgeon: Elie Goody, MD;  Location: [facility];  Service: Orthopedics;  Laterality: Left;   I&D EXTREMITY Right 02/10/2016   Procedure: IRRIGATION AND DEBRIDEMENT EXTREMITY;  Surgeon: Dorna Leitz, MD;  Location: [place];  Service: Orthopedics;  Laterality: Right;   TENDON REPAIR Right 02/10/2016   Procedure: Anterior TENDON REPAIR and wound exploration.;  Surgeon: Dorna Leitz, MD;  Location: [place];  Service: Orthopedics;  Laterality: Right;    HEMATOLOGY/ONCOLOGY HISTORY:   No history exists.    ALLERGIES:  is allergic to penicillins.  MEDICATIONS:  Current Facility-Administered Medications  Medication Dose Route Frequency Provider Last Rate Last Dose   0.9 %  sodium chloride infusion   Intravenous Continuous Charlynne Cousins, MD       acetaminophen (TYLENOL) tablet 650 mg  650 mg Oral Q6H PRN Allie Bossier, MD       Or   acetaminophen (TYLENOL) suppository 650 mg  650 mg Rectal Q6H PRN Allie Bossier, MD       aspirin EC tablet 81 mg  81 mg Oral Daily Allie Bossier, MD       enoxaparin (LOVENOX) injection 40 mg  40 mg Subcutaneous Daily Allie Bossier, MD       folic acid injection 1 mg  1 mg Intravenous  Daily Allie Bossier, MD       LORazepam (ATIVAN) injection 2-3 mg  2-3 mg Intravenous Q1H PRN Allie Bossier, MD       multivitamin with minerals tablet 1 tablet  1 tablet Oral Daily Rancour, Stephen, MD       potassium chloride SA (K-DUR,KLOR-CON) CR tablet 40 mEq  40 mEq Oral BID Charlynne Cousins, MD       thiamine (VITAMIN B-1) tablet 100 mg  100 mg Oral Daily Rancour, Stephen, MD       Or   thiamine (B-1) injection 100 mg  100 mg Intravenous Daily Rancour, Stephen, MD       traMADol Veatrice Bourbon) tablet 50 mg  50 mg Oral Q6H PRN Allie Bossier, MD        VITAL SIGNS: BP  118/81 (BP Location: Right Arm)    Pulse 92    Temp 97.9 F (36.6 C) (Oral)    Resp (!) 25    Ht 6\' 4"  (1.93 m)    Wt 66.3 kg    SpO2 96%    BMI 17.79 kg/m  Filed Weights   08/19/18 0500  Weight: 66.3 kg    Estimated body mass index is 17.79 kg/m as calculated from the following:   Height as of this encounter: 6\' 4"  (1.93 m).   Weight as of this encounter: 66.3 kg.  LABS: CBC:    Component Value Date/Time   WBC 14.8 (H) 08/19/2018 0833   HGB 15.6 08/19/2018 0833   HCT 44.1 08/19/2018 0833   PLT 257 08/19/2018 0833   MCV 92.6 08/19/2018 0833   NEUTROABS 11.5 (H) 08/19/2018 0833   LYMPHSABS 1.0 08/19/2018 0833   MONOABS 2.0 (H) 08/19/2018 0833   EOSABS 0.0 08/19/2018 0833   BASOSABS 0.1 08/19/2018 0833   Comprehensive Metabolic Panel:    Component Value Date/Time   NA 126 (L) 08/19/2018 0415   K 3.0 (L) 08/19/2018 0415   CL 87 (L) 08/19/2018 0415   CO2 25 08/19/2018 0415   BUN 5 (L) 08/19/2018 0415   CREATININE 0.80 08/19/2018 0415   GLUCOSE 98 08/19/2018 0415   CALCIUM 7.9 (L) 08/19/2018 0415   AST 18 08/19/2018 0415   ALT 16 08/19/2018 0415   ALKPHOS 101 08/19/2018 0415   BILITOT 0.7 08/19/2018 0415   PROT 5.9 (L) 08/19/2018 0415   ALBUMIN 2.5 (L) 08/19/2018 0415    RADIOGRAPHIC STUDIES: Ct Head Wo Contrast  Result Date: 08/19/2018 CLINICAL DATA:  History of multiple falls EXAM: CT HEAD WITHOUT CONTRAST TECHNIQUE: Contiguous axial images were obtained from the base of the skull through the vertex without intravenous contrast. COMPARISON:  MRI 06/04/2018, CT brain 05/30/2018 FINDINGS: Brain: No acute territorial infarction, hemorrhage, or intracranial mass. Mild to moderate atrophy. Moderate small vessel ischemic changes of the white matter. Chronic infarcts in the right thalamus and basal ganglia. Stable ventricle size Vascular: No hyperdense vessels.  Carotid vascular calcification Skull: Normal. Negative for fracture or focal lesion. Sinuses/Orbits: No acute  finding. Mucosal thickening in the sphenoid sinuses Other: None IMPRESSION: 1. No CT evidence for acute intracranial abnormality. 2. Atrophy and small vessel ischemic changes of the white matter Electronically Signed   By: Donavan Foil M.D.   On: 08/19/2018 02:55   Ct Chest W Contrast  Result Date: 08/19/2018 CLINICAL DATA:  Fall. Blunt chest trauma. EXAM: CT CHEST, ABDOMEN, AND PELVIS WITH CONTRAST TECHNIQUE: Multidetector CT imaging of the chest, abdomen and pelvis was performed following the  standard protocol during bolus administration of intravenous contrast. CONTRAST:  131mL OMNIPAQUE IOHEXOL 300 MG/ML  SOLN COMPARISON:  CT abdomen pelvis 11/16/2010 FINDINGS: CT CHEST FINDINGS Cardiovascular: There are coronary artery and aortic atherosclerotic calcifications. There is a stent within the proximal left subclavian artery, but visualization of the stent lumen is limited. The subclavian artery distal to the stent is patent. Mediastinum/Nodes: No mediastinal hematoma. No mediastinal, hilar or axillary lymphadenopathy. Normal thyroid Lungs/Pleura: There is atelectasis at the right lung base. No pneumothorax or pulmonary contusion. Musculoskeletal: There are acute fractures of the left seventh, eighth and ninth ribs. There are multiple old right-sided rib fractures. Many of these are displaced and nonunited. No acute rib fracture. CT ABDOMEN PELVIS FINDINGS Hepatobiliary: No hepatic hematoma or laceration. No biliary dilatation. Status post cholecystectomy. Pancreas: Normal contours without ductal dilatation. No peripancreatic fluid collection. Spleen: No splenic laceration or hematoma. Adrenals/Urinary Tract: --Adrenal glands: No adrenal hemorrhage. --Right kidney/ureter: No hydronephrosis or perinephric hematoma. --Left kidney/ureter: No hydronephrosis or perinephric hematoma. --Urinary bladder: Unremarkable. Stomach/Bowel: --Stomach/Duodenum: No hiatal hernia or other gastric abnormality. Normal duodenal  course and caliber. --Small bowel: No dilatation or inflammation. --Colon: No focal abnormality. --Appendix: Not visualized. No right lower quadrant inflammation or free fluid. Vascular/Lymphatic: Atherosclerotic calcification is present within the non-aneurysmal abdominal aorta, without hemodynamically significant stenosis. No abdominal or pelvic lymphadenopathy. Reproductive: Unremarkable Musculoskeletal. Multilevel degenerative disc disease and facet arthrosis. No bony spinal canal stenosis. Other: None. IMPRESSION: 1. Acute fractures of the left seventh, eighth and ninth ribs. No pneumothorax or pulmonary contusion. 2. Multiple old right rib fractures, some which remain displaced and non-united. 3. Coronary artery and Aortic Atherosclerosis (ICD10-I70.0). Electronically Signed   By: Ulyses Jarred M.D.   On: 08/19/2018 02:58   Ct Abdomen Pelvis W Contrast  Result Date: 08/19/2018 CLINICAL DATA:  Fall. Blunt chest trauma. EXAM: CT CHEST, ABDOMEN, AND PELVIS WITH CONTRAST TECHNIQUE: Multidetector CT imaging of the chest, abdomen and pelvis was performed following the standard protocol during bolus administration of intravenous contrast. CONTRAST:  132mL OMNIPAQUE IOHEXOL 300 MG/ML  SOLN COMPARISON:  CT abdomen pelvis 11/16/2010 FINDINGS: CT CHEST FINDINGS Cardiovascular: There are coronary artery and aortic atherosclerotic calcifications. There is a stent within the proximal left subclavian artery, but visualization of the stent lumen is limited. The subclavian artery distal to the stent is patent. Mediastinum/Nodes: No mediastinal hematoma. No mediastinal, hilar or axillary lymphadenopathy. Normal thyroid Lungs/Pleura: There is atelectasis at the right lung base. No pneumothorax or pulmonary contusion. Musculoskeletal: There are acute fractures of the left seventh, eighth and ninth ribs. There are multiple old right-sided rib fractures. Many of these are displaced and nonunited. No acute rib fracture. CT  ABDOMEN PELVIS FINDINGS Hepatobiliary: No hepatic hematoma or laceration. No biliary dilatation. Status post cholecystectomy. Pancreas: Normal contours without ductal dilatation. No peripancreatic fluid collection. Spleen: No splenic laceration or hematoma. Adrenals/Urinary Tract: --Adrenal glands: No adrenal hemorrhage. --Right kidney/ureter: No hydronephrosis or perinephric hematoma. --Left kidney/ureter: No hydronephrosis or perinephric hematoma. --Urinary bladder: Unremarkable. Stomach/Bowel: --Stomach/Duodenum: No hiatal hernia or other gastric abnormality. Normal duodenal course and caliber. --Small bowel: No dilatation or inflammation. --Colon: No focal abnormality. --Appendix: Not visualized. No right lower quadrant inflammation or free fluid. Vascular/Lymphatic: Atherosclerotic calcification is present within the non-aneurysmal abdominal aorta, without hemodynamically significant stenosis. No abdominal or pelvic lymphadenopathy. Reproductive: Unremarkable Musculoskeletal. Multilevel degenerative disc disease and facet arthrosis. No bony spinal canal stenosis. Other: None. IMPRESSION: 1. Acute fractures of the left seventh, eighth and ninth ribs. No pneumothorax  or pulmonary contusion. 2. Multiple old right rib fractures, some which remain displaced and non-united. 3. Coronary artery and Aortic Atherosclerosis (ICD10-I70.0). Electronically Signed   By: Ulyses Jarred M.D.   On: 08/19/2018 02:58   Dg Chest Portable 1 View  Result Date: 08/18/2018 CLINICAL DATA:  Left chest pain for the past 8 days and shortness of breath for the past 10 days. [place] 3 times in the last 2 days. Possible alcohol and drug use today. EXAM: PORTABLE CHEST 1 VIEW COMPARISON:  05/29/2018. FINDINGS: Normal sized heart. Previously demonstrated multiple right rib fractures without interval healing. There is a healing right posterior 5th rib fracture. Mild right basilar atelectasis or scarring. Stable post CABG changes. Moderate right  shoulder degenerative changes and fixation screw. Left proximal common carotid artery stent. IMPRESSION: 1. Healing and non healed right rib fractures. 2. Mild right basilar atelectasis or scarring. Electronically Signed   By: Claudie Revering M.D.   On: 08/18/2018 23:54    PERFORMANCE STATUS (ECOG) : 1 - Symptomatic but completely ambulatory  REVIEW OF SYSTEMS: As noted above. Otherwise, a complete review of systems is negative.  PHYSICAL EXAM: General: Frail-appearing, thin, lying in bed Cardiovascular: regular rate and rhythm Pulmonary: clear ant fields Abdomen: soft, nontender, + bowel sounds GU: no suprapubic tenderness Extremities: no edema, no joint deformities Skin: no rashes Neurological: Wakes with stimulation, confused, oriented to person, follows simple commands  IMPRESSION: Patient confused.  Inebriated upon admission.  No family present.  Patient known to me from his previous hospitalization in December 2019.  Patient presented with similar complaints at that time.  I had talked with daughter (see note 12/28 and 12/29) who was quite frustrated regarding patient's poor decision making.  I again called daughter today.  She is a paramedic and has a reasonable understanding of patient's current medical issues.  She says that following the last hospitalization, patient was discharged to [facility] for rehab.  He was determined to be a risk for wandering and had a locator bracelet applied.  However, despite having that bracelet in place, patient ultimately left the facility.  He was found at his home and was felt to have capacity for signing the [facility] paperwork.  His daughter has been trying to get patient to agree to move into an assisted living facility but patient has resisted this idea.  Daughter seems understandably frustrated with patient's current decision making.  Family has been told in the past that he likely has Wernicke's encephalopathy given the substantial amount of  alcohol that he drinks regularly.  Questionable underlying dementia was suspected during the previous hospitalization. However, daughter feels that patient can easily manipulate his way through a capacity evaluation.  Psych consult pending.  We will also consult social work.  Would patient benefit from an APS referral given the question of his capacity and his risk for continued self-harm with ongoing substance abuse?  I discussed CODE STATUS with patient's daughter.  She requested that CODE STATUS be changed to DNR.  PLAN: -Continue current scope of treatment -Psych consult pending -Consult social work -DNR   Time Total: 60 minutes  Visit consisted of counseling and education dealing with the complex and emotionally intense issues of symptom management and palliative care in the setting of serious and potentially life-threatening illness.Greater than 50%  of this time was spent counseling and coordinating care related to the above assessment and plan.  Signed by: Altha Harm, PhD, NP-C [phone] (Work Cell)

## 2018-08-19 NOTE — Consult Note (Signed)
Patient seen in his room but he is lethargic, disoriented and unable to participate in Capacity evaluation. Re-consult psych service when patient is alert and awake.  Corena Pilgrim, MD Attending Psychiatrist

## 2018-08-19 NOTE — Evaluation (Signed)
Physical Therapy Evaluation Patient Details Name: [name] Pace MRN: [id number] DOB: 23-Jun-1950 Today's Date: 08/19/2018   History of Present Illness  Pt is a 68 yo male admitted s/p multilple recent falls presenting with L chest pain. pt found to have broken L 7-9 ribs, no pneumothorax.  Was recently hospitalized this past december to january. PMH: long history of ETOH and cocain abuse, pt inebriated upon arrival. Pt left [facility]. PMH: anxiety, breast and prostate ca, stroke PSH: L IM nail to femur 06/2014, Anterior tendon repair 02/2016.  Clinical Impression  Pt admitted with above. Pt with history of non-compliance and leaving AMA from hospital and [facility]. Aware pt may be IVC upon d/c. Pt participated in PT today and was minA for transfer to chair, pt deferred ambulation despite max encouragement. Pt presenting with impaired processing, disoriented, impaired sequencing, and decreased safety awareness. Acute PT to cont to follow.    Follow Up Recommendations SNF;Supervision/Assistance - 24 hour(however per MD notes pt may be IVC)    Equipment Recommendations  None recommended by PT(TBD)    Recommendations for Other Services       Precautions / Restrictions Precautions Precautions: Fall Precaution Comments: CIWA protocol Restrictions Weight Bearing Restrictions: No      Mobility  Bed Mobility Overal bed mobility: Needs Assistance Bed Mobility: Supine to Sit     Supine to sit: Min assist     General bed mobility comments: max directional verbal and tactile cues, HOB elevated, increased time, assist for trunk elevation  Transfers Overall transfer level: Needs assistance Equipment used: 1 person hand held assist Transfers: Sit to/from [facility] Sit to Stand: Min assist Stand pivot transfers: Min assist       General transfer comment: maximal multimodal cues to complete std pvt to chair, pt able to advance LEs without assist, pt reaching for IV  pole, pt unsteady.   Ambulation/Gait             General Gait Details: pt deferred ambulation at this time becauase "I haven't had any sleep"  Stairs            Wheelchair Mobility    Modified Rankin (Stroke Patients Only)       Balance Overall balance assessment: Needs assistance Sitting-balance support: Feet supported;No upper extremity supported Sitting balance-Leahy Scale: Fair Sitting balance - Comments: attempted to don socks in sitting, pt with difficulty and LOB requiring min/modA to complete task   Standing balance support: Single extremity supported Standing balance-Leahy Scale: Fair Standing balance comment: requires physical assist to prevent fall, pt stood and performed pericare with unilateral UE support and minA                             Pertinent Vitals/Pain Pain Assessment: No/denies pain    Home Living Family/patient expects to be discharged to:: Unsure                 Additional Comments: pt was recently hospitalized and d/c'd to [facility] SNF in which he left AMA and went back to his apartment with his girlfriend and fell multiple times. Per chart pt may be IVC upon d/c.     Prior Function Level of Independence: Needs assistance   Gait / Transfers Assistance Needed: pt with history of freq falls, appears he wasn't using AD, usure how he was doing at [place] [facility] [place] Needed: unsure  Comments: pt is a poor  historian and not oriented     Hand Dominance   Dominant Hand: Right    Extremity/Trunk Assessment   Upper Extremity Assessment Upper Extremity Assessment: Generalized weakness    Lower Extremity Assessment Lower Extremity Assessment: Generalized weakness    Cervical / Trunk Assessment Cervical / Trunk Assessment: Kyphotic  Communication   Communication: No difficulties  Cognition Arousal/Alertness: Awake/alert(woke up easily) Behavior During Therapy: Flat affect Overall Cognitive  Status: Impaired/Different from baseline Area of Impairment: Orientation;Problem solving;Memory                 Orientation Level: Disoriented to;Time;Situation(stated [facility] then [facility])   Memory: Decreased short-term memory       Problem Solving: Slow processing;Decreased initiation;Difficulty sequencing;Requires verbal cues;Requires tactile cues General Comments: pt requiring max multimodal cues to complete tasks. pt's condom cath came off and pt was soiled in urine in the bed and he had no idea he was wet      General Comments General comments (skin integrity, edema, etc.): VSS    Exercises     Assessment/Plan    PT Assessment Patient needs continued PT services  PT Problem List Decreased strength;Decreased activity tolerance;Decreased balance;Decreased mobility;Decreased coordination;Decreased cognition;Decreased knowledge of use of DME;Decreased safety awareness       PT Treatment Interventions DME instruction;Gait training;Stair training;Functional mobility training;Therapeutic activities;Therapeutic exercise    PT Goals (Current goals can be found in the Care Plan section)  Acute Rehab PT Goals Patient Stated Goal: didn't state PT Goal Formulation: Patient unable to participate in goal setting Time For Goal Achievement: 09/02/18 Potential to Achieve Goals: Good    Frequency Min 3X/week   Barriers to discharge Decreased caregiver support unsure if patient has 24/7 that is dependable    Co-evaluation               AM-PAC PT "6 Clicks" Mobility  Outcome Measure Help needed turning from your back to your side while in a flat bed without using bedrails?: A Little Help needed moving from lying on your back to sitting on the side of a flat bed without using bedrails?: A Lot Help needed moving to and from a bed to a chair (including a wheelchair)?: A Little Help needed standing up from a chair using your arms (e.g., wheelchair or bedside chair)?: A Little Help  needed to walk in hospital room?: A Little Help needed climbing 3-5 steps with a railing? : A Lot 6 Click Score: 16    End of Session Equipment Utilized During Treatment: Gait belt Activity Tolerance: Patient tolerated treatment well Patient left: in chair;with call bell/phone within reach;with nursing/sitter in room(chair alarm pad under pt, staff looking for alarm box) Nurse Communication: Mobility status(need for alarm box) PT Visit Diagnosis: Unsteadiness on feet (R26.81);Difficulty in walking, not elsewhere classified (R26.2)    Time: 7062-3762 PT Time Calculation (min) (ACUTE ONLY): 26 min   Charges:   PT Evaluation $PT Eval Moderate Complexity: 1 Mod PT Treatments $Therapeutic Activity: 8-22 mins        Kittie Plater, PT, DPT Acute Rehabilitation Services Pager #: [phone] Office #: [phone]   Berline Lopes 08/19/2018, 2:34 PM

## 2018-08-19 NOTE — ED Notes (Addendum)
ED TO INPATIENT HANDOFF REPORT  ED Nurse Name and Phone #:  Dellie Catholic [phone]  S Name/Age/Gender [name] Pace 68 y.o. male Room/Bed: 027C/027C  Code Status   Code Status: Full Code  Home/SNF/Other Home Patient oriented to: self, place, time and situation Is this baseline? Yes   Triage Complete: Triage complete  Chief Complaint sob, chest pain  Triage Note Pt. BIB EMS c/o left side chest pain past 8 days and SOB for past 10 dayas. Pt. States he has falling x3 in last couple of days. Possible ETOH and drug use today. Pt. States he drank 3 beers.   HX of recent falls and pneumonia. Per EMS, pt. Has not eaten past few days.   Allergies Allergies  Allergen Reactions  . Penicillins Anaphylaxis    throat    Level of Care/Admitting Diagnosis ED Disposition    ED Disposition Condition [facility] Area: [place] [[id number]]  Level of Care: Progressive [102]  Diagnosis: Polysubstance abuse [facility]) [[id number]]  Admitting Physician: Allie Bossier [[id number]]  Attending Physician: Allie Bossier [[id number]]  Estimated length of stay: 5 - 7 days  Certification:: I certify this patient will need inpatient services for at least 2 midnights  PT Class (Do Not Modify): Inpatient [101]  PT Acc Code (Do Not Modify): Private [1]       B Medical/Surgery History Past Medical History:  Diagnosis Date  . Alcohol abuse   . Anxiety   . Breast cancer ([place]) Left breast   Per patient diagnosed in early 106's.   . Cocaine abuse ([place]) 08/19/2018  . Coronary artery disease   . High cholesterol   . Left rib fracture 08/19/2018  . Prostate cancer ([place])    per patient diagnosed in early 49's  . Stroke [facility])    Past Surgical History:  Procedure Laterality Date  . CAROTID STENT    . CORONARY ARTERY BYPASS GRAFT    . FEMUR IM NAIL Left 07/02/2014   Procedure: INTRAMEDULLARY (IM) NAIL FEMORAL;  Surgeon: Elie Goody, MD;  Location: [facility];   Service: Orthopedics;  Laterality: Left;  . I&D EXTREMITY Right 02/10/2016   Procedure: IRRIGATION AND DEBRIDEMENT EXTREMITY;  Surgeon: Dorna Leitz, MD;  Location: [place];  Service: Orthopedics;  Laterality: Right;  . TENDON REPAIR Right 02/10/2016   Procedure: Anterior TENDON REPAIR and wound exploration.;  Surgeon: Dorna Leitz, MD;  Location: [place];  Service: Orthopedics;  Laterality: Right;     A IV Location/Drains/Wounds Patient Lines/Drains/Airways Status   Active Line/Drains/Airways    Name:   Placement date:   Placement time:   Site:   Days:   Peripheral IV 08/19/18 Right Forearm   08/19/18    0025    Forearm   less than 1   External Urinary Catheter   05/28/18    1219    -   83   Wound / Incision (Open or Dehisced) 05/29/18 Puncture Back Right   05/29/18    1200    Back   82          Intake/Output Last 24 hours No intake or output data in the 24 hours ending 08/19/18 0404  Labs/Imaging Results for orders placed or performed during the hospital encounter of 08/18/18 (from the past 48 hour(s))  Influenza panel by PCR (type A & B)     Status: None   Collection Time: 08/19/18 12:01 AM  Result Value Ref Range   Influenza  A By PCR NEGATIVE NEGATIVE   Influenza B By PCR NEGATIVE NEGATIVE    Comment: (NOTE) The Xpert Xpress Flu assay is intended as an aid in the diagnosis of  influenza and should not be used as a sole basis for treatment.  This  assay is FDA approved for nasopharyngeal swab specimens only. Nasal  washings and aspirates are unacceptable for Xpert Xpress Flu testing. Performed at [facility], [place] [address], [place], [place] [address]   CBC with Differential/Platelet     Status: Abnormal   Collection Time: 08/19/18 12:27 AM  Result Value Ref Range   WBC 15.4 (H) 4.0 - 10.5 K/uL   RBC 4.85 4.22 - 5.81 MIL/uL   Hemoglobin 15.9 13.0 - 17.0 g/dL   HCT 45.5 39.0 - 52.0 %   MCV 93.8 80.0 - 100.0 fL   MCH 32.8 26.0 - 34.0 pg   MCHC 34.9 30.0 - 36.0 g/dL   RDW  13.5 11.5 - 15.5 %   Platelets 267 150 - 400 K/uL   nRBC 0.0 0.0 - 0.2 %   Neutrophils Relative % 79 %   Neutro Abs 12.2 (H) 1.7 - 7.7 K/uL   Lymphocytes Relative 8 %   Lymphs Abs 1.2 0.7 - 4.0 K/uL   Monocytes Relative 11 %   Monocytes Absolute 1.7 (H) 0.1 - 1.0 K/uL   Eosinophils Relative 1 %   Eosinophils Absolute 0.1 0.0 - 0.5 K/uL   Basophils Relative 0 %   Basophils Absolute 0.1 0.0 - 0.1 K/uL   Immature Granulocytes 1 %   Abs Immature Granulocytes 0.16 (H) 0.00 - 0.07 K/uL    Comment: Performed at [facility] [address], [place], [place] [address]  Comprehensive metabolic panel     Status: Abnormal   Collection Time: 08/19/18 12:27 AM  Result Value Ref Range   Sodium 120 (L) 135 - 145 mmol/L   Potassium 3.3 (L) 3.5 - 5.1 mmol/L   Chloride 84 (L) 98 - 111 mmol/L   CO2 17 (L) 22 - 32 mmol/L   Glucose, Bld 88 70 - 99 mg/dL   BUN 6 (L) 8 - 23 mg/dL   Creatinine, Ser 0.62 0.61 - 1.24 mg/dL   Calcium 7.9 (L) 8.9 - 10.3 mg/dL   Total Protein 6.3 (L) 6.5 - 8.1 g/dL   Albumin 2.6 (L) 3.5 - 5.0 g/dL   AST 23 15 - 41 U/L   ALT 16 0 - 44 U/L   Alkaline Phosphatase 106 38 - 126 U/L   Total Bilirubin 0.5 0.3 - 1.2 mg/dL   GFR calc non Af Amer >60 >60 mL/min   GFR calc Af Amer >60 >60 mL/min   Anion gap 19 (H) 5 - 15    Comment: Performed at [facility], [place] [address] [place], [place] [address]  Troponin I - ONCE - STAT     Status: None   Collection Time: 08/19/18 12:27 AM  Result Value Ref Range   Troponin I <0.03 <0.03 ng/mL    Comment: Performed at [facility] [address] [place], [place] [address]  Lipase, blood     Status: None   Collection Time: 08/19/18 12:27 AM  Result Value Ref Range   Lipase 31 11 - 51 U/L    Comment: Performed at [facility] [address], [place],  [address]  CK     Status: None   Collection Time: 08/19/18 12:27  AM  Result Value Ref Range   Total CK 52 49 - 397 U/L    Comment: Performed at [facility], [place] [address], [place], [place] [address]  Lactic acid, plasma     Status: None   Collection Time: 08/19/18 12:28 AM  Result Value Ref Range   Lactic Acid, Venous 1.9 0.5 - 1.9 mmol/L    Comment: Performed at [facility] [address], [place], [place] [address]  Ethanol     Status: Abnormal   Collection Time: 08/19/18 12:29 AM  Result Value Ref Range   Alcohol, Ethyl (B) 54 (H) <10 mg/dL    Comment: (NOTE) Lowest detectable limit for serum alcohol is 10 mg/dL. For medical purposes only. Performed at [facility], [place] [address], [place], [place] [address]   Urinalysis, Routine w reflex microscopic     Status: Abnormal   Collection Time: 08/19/18  1:10 AM  Result Value Ref Range   Color, Urine YELLOW YELLOW   APPearance CLEAR CLEAR   Specific Gravity, Urine 1.003 (L) 1.005 - 1.030   pH 6.0 5.0 - 8.0   Glucose, UA NEGATIVE NEGATIVE mg/dL   Hgb urine dipstick MODERATE (A) NEGATIVE   Bilirubin Urine NEGATIVE NEGATIVE   Ketones, ur NEGATIVE NEGATIVE mg/dL   Protein, ur NEGATIVE NEGATIVE mg/dL   Nitrite NEGATIVE NEGATIVE   Leukocytes,Ua MODERATE (A) NEGATIVE   RBC / HPF 0-5 0 - 5 RBC/hpf   WBC, UA 11-20 0 - 5 WBC/hpf   Bacteria, UA RARE (A) NONE SEEN    Comment: Performed at [facility], [place] [address], [place], [place] [address]  Rapid urine drug screen (hospital performed)     Status: Abnormal   Collection Time: 08/19/18  1:11 AM  Result Value Ref Range   Opiates NONE DETECTED NONE DETECTED   Cocaine POSITIVE (A) NONE DETECTED   Benzodiazepines NONE DETECTED NONE DETECTED   Amphetamines NONE DETECTED NONE DETECTED   Tetrahydrocannabinol NONE DETECTED NONE DETECTED   Barbiturates NONE DETECTED NONE DETECTED    Comment: (NOTE) DRUG SCREEN FOR MEDICAL PURPOSES ONLY.  IF CONFIRMATION IS NEEDED FOR ANY PURPOSE, NOTIFY LAB WITHIN 5 DAYS. LOWEST DETECTABLE LIMITS FOR URINE DRUG SCREEN Drug Class                     Cutoff  (ng/mL) Amphetamine and metabolites    1000 Barbiturate and metabolites    200 Benzodiazepine                 700 Tricyclics and metabolites     300 Opiates and metabolites        300 Cocaine and metabolites        300 THC                            50 Performed at [facility], [place] [address] [place], [place] [address]    Ct Head Wo Contrast  Result Date: 08/19/2018 CLINICAL DATA:  History of multiple falls EXAM: CT HEAD WITHOUT CONTRAST TECHNIQUE: Contiguous axial images were obtained from the base of the skull through the vertex without intravenous contrast. COMPARISON:  MRI 06/04/2018, CT brain 05/30/2018 FINDINGS: Brain: No acute territorial infarction, hemorrhage, or intracranial mass. Mild to moderate atrophy. Moderate small vessel ischemic changes of the white matter. Chronic infarcts in the right thalamus and basal ganglia. Stable ventricle size Vascular: No hyperdense  vessels.  Carotid vascular calcification Skull: Normal. Negative for fracture or focal lesion. Sinuses/Orbits: No acute finding. Mucosal thickening in the sphenoid sinuses Other: None IMPRESSION: 1. No CT evidence for acute intracranial abnormality. 2. Atrophy and small vessel ischemic changes of the white matter Electronically Signed   By: Donavan Foil M.D.   On: 08/19/2018 02:55   Ct Chest W Contrast  Result Date: 08/19/2018 CLINICAL DATA:  Fall. Blunt chest trauma. EXAM: CT CHEST, ABDOMEN, AND PELVIS WITH CONTRAST TECHNIQUE: Multidetector CT imaging of the chest, abdomen and pelvis was performed following the standard protocol during bolus administration of intravenous contrast. CONTRAST:  111mL OMNIPAQUE IOHEXOL 300 MG/ML  SOLN COMPARISON:  CT abdomen pelvis 11/16/2010 FINDINGS: CT CHEST FINDINGS Cardiovascular: There are coronary artery and aortic atherosclerotic calcifications. There is a stent within the proximal left subclavian artery, but visualization of the stent lumen is limited. The subclavian artery  distal to the stent is patent. Mediastinum/Nodes: No mediastinal hematoma. No mediastinal, hilar or axillary lymphadenopathy. Normal thyroid Lungs/Pleura: There is atelectasis at the right lung base. No pneumothorax or pulmonary contusion. Musculoskeletal: There are acute fractures of the left seventh, eighth and ninth ribs. There are multiple old right-sided rib fractures. Many of these are displaced and nonunited. No acute rib fracture. CT ABDOMEN PELVIS FINDINGS Hepatobiliary: No hepatic hematoma or laceration. No biliary dilatation. Status post cholecystectomy. Pancreas: Normal contours without ductal dilatation. No peripancreatic fluid collection. Spleen: No splenic laceration or hematoma. Adrenals/Urinary Tract: --Adrenal glands: No adrenal hemorrhage. --Right kidney/ureter: No hydronephrosis or perinephric hematoma. --Left kidney/ureter: No hydronephrosis or perinephric hematoma. --Urinary bladder: Unremarkable. Stomach/Bowel: --Stomach/Duodenum: No hiatal hernia or other gastric abnormality. Normal duodenal course and caliber. --Small bowel: No dilatation or inflammation. --Colon: No focal abnormality. --Appendix: Not visualized. No right lower quadrant inflammation or free fluid. Vascular/Lymphatic: Atherosclerotic calcification is present within the non-aneurysmal abdominal aorta, without hemodynamically significant stenosis. No abdominal or pelvic lymphadenopathy. Reproductive: Unremarkable Musculoskeletal. Multilevel degenerative disc disease and facet arthrosis. No bony spinal canal stenosis. Other: None. IMPRESSION: 1. Acute fractures of the left seventh, eighth and ninth ribs. No pneumothorax or pulmonary contusion. 2. Multiple old right rib fractures, some which remain displaced and non-united. 3. Coronary artery and Aortic Atherosclerosis (ICD10-I70.0). Electronically Signed   By: Ulyses Jarred M.D.   On: 08/19/2018 02:58   Ct Abdomen Pelvis W Contrast  Result Date: 08/19/2018 CLINICAL DATA:   Fall. Blunt chest trauma. EXAM: CT CHEST, ABDOMEN, AND PELVIS WITH CONTRAST TECHNIQUE: Multidetector CT imaging of the chest, abdomen and pelvis was performed following the standard protocol during bolus administration of intravenous contrast. CONTRAST:  159mL OMNIPAQUE IOHEXOL 300 MG/ML  SOLN COMPARISON:  CT abdomen pelvis 11/16/2010 FINDINGS: CT CHEST FINDINGS Cardiovascular: There are coronary artery and aortic atherosclerotic calcifications. There is a stent within the proximal left subclavian artery, but visualization of the stent lumen is limited. The subclavian artery distal to the stent is patent. Mediastinum/Nodes: No mediastinal hematoma. No mediastinal, hilar or axillary lymphadenopathy. Normal thyroid Lungs/Pleura: There is atelectasis at the right lung base. No pneumothorax or pulmonary contusion. Musculoskeletal: There are acute fractures of the left seventh, eighth and ninth ribs. There are multiple old right-sided rib fractures. Many of these are displaced and nonunited. No acute rib fracture. CT ABDOMEN PELVIS FINDINGS Hepatobiliary: No hepatic hematoma or laceration. No biliary dilatation. Status post cholecystectomy. Pancreas: Normal contours without ductal dilatation. No peripancreatic fluid collection. Spleen: No splenic laceration or hematoma. Adrenals/Urinary Tract: --Adrenal glands: No adrenal hemorrhage. --Right kidney/ureter: No hydronephrosis  or perinephric hematoma. --Left kidney/ureter: No hydronephrosis or perinephric hematoma. --Urinary bladder: Unremarkable. Stomach/Bowel: --Stomach/Duodenum: No hiatal hernia or other gastric abnormality. Normal duodenal course and caliber. --Small bowel: No dilatation or inflammation. --Colon: No focal abnormality. --Appendix: Not visualized. No right lower quadrant inflammation or free fluid. Vascular/Lymphatic: Atherosclerotic calcification is present within the non-aneurysmal abdominal aorta, without hemodynamically significant stenosis. No  abdominal or pelvic lymphadenopathy. Reproductive: Unremarkable Musculoskeletal. Multilevel degenerative disc disease and facet arthrosis. No bony spinal canal stenosis. Other: None. IMPRESSION: 1. Acute fractures of the left seventh, eighth and ninth ribs. No pneumothorax or pulmonary contusion. 2. Multiple old right rib fractures, some which remain displaced and non-united. 3. Coronary artery and Aortic Atherosclerosis (ICD10-I70.0). Electronically Signed   By: Ulyses Jarred M.D.   On: 08/19/2018 02:58   Dg Chest Portable 1 View  Result Date: 08/18/2018 CLINICAL DATA:  Left chest pain for the past 8 days and shortness of breath for the past 10 days. [place] 3 times in the last 2 days. Possible alcohol and drug use today. EXAM: PORTABLE CHEST 1 VIEW COMPARISON:  05/29/2018. FINDINGS: Normal sized heart. Previously demonstrated multiple right rib fractures without interval healing. There is a healing right posterior 5th rib fracture. Mild right basilar atelectasis or scarring. Stable post CABG changes. Moderate right shoulder degenerative changes and fixation screw. Left proximal common carotid artery stent. IMPRESSION: 1. Healing and non healed right rib fractures. 2. Mild right basilar atelectasis or scarring. Electronically Signed   By: Claudie Revering M.D.   On: 08/18/2018 23:54    Pending Labs Unresulted Labs (From admission, onward)    Start     Ordered   08/26/18 0500  Creatinine, serum  (enoxaparin (LOVENOX)    CrCl >/= 30 ml/min)  Weekly,   R    Comments:  while on enoxaparin therapy    08/19/18 0355   08/19/18 0355  Brain natriuretic peptide  Once,   R     08/19/18 0355   08/19/18 0351  CBC  (enoxaparin (LOVENOX)    CrCl >/= 30 ml/min)  Once,   R    Comments:  Baseline for enoxaparin therapy IF NOT ALREADY DRAWN.  Notify MD if PLT < 100 K.    08/19/18 0355   08/19/18 0347  Comprehensive metabolic panel  Once,   R     08/19/18 0348   08/19/18 0347  Magnesium  Once,   R     08/19/18 0348    08/19/18 0347  Phosphorus  Once,   R     08/19/18 0348   08/19/18 0312  Troponin I - ONCE - STAT  ONCE - STAT,   STAT     08/19/18 0311   08/19/18 0038  Lactic acid, plasma  Now then every 2 hours,   STAT     08/19/18 0037   08/18/18 2330  Blood culture (routine x 2)  BLOOD CULTURE X 2,   STAT     08/18/18 2330          Vitals/Pain Today's Vitals   08/19/18 0245 08/19/18 0300 08/19/18 0342 08/19/18 0350  BP:   (!) 168/79   Pulse:   (!) 104   Resp: (!) 25 20    Temp:      TempSrc:      SpO2:      Height:      PainSc:    Asleep    Isolation Precautions Droplet precaution  Medications Medications  thiamine (VITAMIN B-1) tablet 100 mg (  has no administration in time range)    Or  thiamine (B-1) injection 100 mg (has no administration in time range)  multivitamin with minerals tablet 1 tablet (has no administration in time range)  sodium chloride 0.9 % 1,000 mL with thiamine 751 mg, folic acid 1 mg, multivitamins adult 10 mL infusion (has no administration in time range)  LORazepam (ATIVAN) injection 2-3 mg (has no administration in time range)  folic acid injection 1 mg (has no administration in time range)  enoxaparin (LOVENOX) injection 40 mg (has no administration in time range)  acetaminophen (TYLENOL) tablet 650 mg (has no administration in time range)    Or  acetaminophen (TYLENOL) suppository 650 mg (has no administration in time range)  traMADol (ULTRAM) tablet 50 mg (has no administration in time range)  aspirin EC tablet 81 mg (has no administration in time range)  sodium chloride 0.9 % bolus 1,000 mL (0 mLs Intravenous Stopped 08/19/18 0159)  iohexol (OMNIPAQUE) 300 MG/ML solution 100 mL (100 mLs Intravenous Contrast Given 08/19/18 0238)    Mobility walks with person assist High fall risk   Focused Assessments Neuro Assessment Handoff:  Swallow screen pass? Yes          Neuro Assessment:   Neuro Checks:      Last Documented NIHSS Modified Score:    Has TPA been given? No If patient is a Neuro Trauma and patient is going to OR before floor call report to Agency nurse: [phone] or [phone]     R Recommendations: See Admitting Provider Note  Report given to:   Additional Notes:  Pt. Admitted from home due to SOB and left side chest pain. Workout: pt. Positive for cocaine use and ETOH. Current CIWA 9.

## 2018-08-19 NOTE — Progress Notes (Signed)
[facility] PROGRESS NOTE    Progress Note  [name] Pace  UXN:[id number] DOB: 06-Sep-1950 DOA: 08/18/2018 PCP: System, Provider Not In     Brief Narrative:   [name] Pace is an 68 y.o. male past medical history of prostate cancer TIA alcohol abuse history of delirium tremens, cocaine abuse who comes in for chest pain multiple rib fractures secondary to multiple falls, he relates his chest pain started after his multiple falls accompanied by shortness of breath started 8 days prior to admission.  Assessment/Plan:   Acute encephalopathy: Patient cannot provide history as he is still inebriated. His encephalopathy is likely due to alcohol intoxication CT head negative for any acute findings.  Alcohol abuse: Has had a long history of alcohol and cocaine. Continue monitor with CIWA protocol, he was given thiamine and folate. He has not received any Ativan this morning.  Chest pain/SOB and left rib fractures: CT of the chest on 08/19/2018 showed multiple acute rib fractures 7 through 9 no pneumothorax.  Multiple old fractures.  Likely due to his inebriation leading to mechanical falls. UDS was positive for cocaine. We will use tramadol for pain. Consult physical therapy for evaluation. Discontinue 2D echo stop checking cardiac biomarkers, patient relate he has not had chest pain or shortness of breath right now.  But he did accept he had chest pain and shortness of breath 8 days prior to admission.  Hyponatremia/[place]ia: Sodium is improving slowly continue normal saline check basic metabolic panel every 12.  Hypokalemia: Replete orally recheck in the morning.  Leukocytosis: Likely due to new fractures.  Cocaine abuse  Tobacco abuse   DVT prophylaxis: lovenox Family Communication:None Disposition Plan/Barrier to D/C: SNF in 2-3 days Code Status:     Code Status Orders  (From admission, onward)         Start     Ordered   08/19/18 0352  Full code  Continuous      08/19/18 0355        Code Status History    Date Active Date Inactive Code Status Order ID Comments User Context   05/14/2018 0217 06/09/2018 2319 Full Code [id number]  [name], MD ED   07/02/2014 2126 07/04/2014 1617 Full Code [id number]  [name], [name], MD Inpatient   07/02/2014 0722 07/02/2014 2126 Full Code [id number]  [name], MD ED   06/14/2014 2332 06/18/2014 2007 Full Code [id number]  [name], MD Inpatient        IV Access:    Peripheral IV   Procedures and diagnostic studies:   Ct Head Wo Contrast  Result Date: 08/19/2018 CLINICAL DATA:  History of multiple falls EXAM: CT HEAD WITHOUT CONTRAST TECHNIQUE: Contiguous axial images were obtained from the base of the skull through the vertex without intravenous contrast. COMPARISON:  MRI 06/04/2018, CT brain 05/30/2018 FINDINGS: Brain: No acute territorial infarction, hemorrhage, or intracranial mass. Mild to moderate atrophy. Moderate small vessel ischemic changes of the white matter. Chronic infarcts in the right thalamus and basal ganglia. Stable ventricle size Vascular: No hyperdense vessels.  Carotid vascular calcification Skull: Normal. Negative for fracture or focal lesion. Sinuses/Orbits: No acute finding. Mucosal thickening in the sphenoid sinuses Other: None IMPRESSION: 1. No CT evidence for acute intracranial abnormality. 2. Atrophy and small vessel ischemic changes of the white matter Electronically Signed   By: Donavan Foil M.D.   On: 08/19/2018 02:55   Ct Chest W Contrast  Result Date: 08/19/2018 CLINICAL DATA:  Fall. Blunt chest trauma. EXAM:  CT CHEST, ABDOMEN, AND PELVIS WITH CONTRAST TECHNIQUE: Multidetector CT imaging of the chest, abdomen and pelvis was performed following the standard protocol during bolus administration of intravenous contrast. CONTRAST:  154mL OMNIPAQUE IOHEXOL 300 MG/ML  SOLN COMPARISON:  CT abdomen pelvis 11/16/2010 FINDINGS: CT CHEST FINDINGS Cardiovascular: There are  coronary artery and aortic atherosclerotic calcifications. There is a stent within the proximal left subclavian artery, but visualization of the stent lumen is limited. The subclavian artery distal to the stent is patent. Mediastinum/Nodes: No mediastinal hematoma. No mediastinal, hilar or axillary lymphadenopathy. Normal thyroid Lungs/Pleura: There is atelectasis at the right lung base. No pneumothorax or pulmonary contusion. Musculoskeletal: There are acute fractures of the left seventh, eighth and ninth ribs. There are multiple old right-sided rib fractures. Many of these are displaced and nonunited. No acute rib fracture. CT ABDOMEN PELVIS FINDINGS Hepatobiliary: No hepatic hematoma or laceration. No biliary dilatation. Status post cholecystectomy. Pancreas: Normal contours without ductal dilatation. No peripancreatic fluid collection. Spleen: No splenic laceration or hematoma. Adrenals/Urinary Tract: --Adrenal glands: No adrenal hemorrhage. --Right kidney/ureter: No hydronephrosis or perinephric hematoma. --Left kidney/ureter: No hydronephrosis or perinephric hematoma. --Urinary bladder: Unremarkable. Stomach/Bowel: --Stomach/Duodenum: No hiatal hernia or other gastric abnormality. Normal duodenal course and caliber. --Small bowel: No dilatation or inflammation. --Colon: No focal abnormality. --Appendix: Not visualized. No right lower quadrant inflammation or free fluid. Vascular/Lymphatic: Atherosclerotic calcification is present within the non-aneurysmal abdominal aorta, without hemodynamically significant stenosis. No abdominal or pelvic lymphadenopathy. Reproductive: Unremarkable Musculoskeletal. Multilevel degenerative disc disease and facet arthrosis. No bony spinal canal stenosis. Other: None. IMPRESSION: 1. Acute fractures of the left seventh, eighth and ninth ribs. No pneumothorax or pulmonary contusion. 2. Multiple old right rib fractures, some which remain displaced and non-united. 3. Coronary artery  and Aortic Atherosclerosis (ICD10-I70.0). Electronically Signed   By: Ulyses Jarred M.D.   On: 08/19/2018 02:58   Ct Abdomen Pelvis W Contrast  Result Date: 08/19/2018 CLINICAL DATA:  Fall. Blunt chest trauma. EXAM: CT CHEST, ABDOMEN, AND PELVIS WITH CONTRAST TECHNIQUE: Multidetector CT imaging of the chest, abdomen and pelvis was performed following the standard protocol during bolus administration of intravenous contrast. CONTRAST:  154mL OMNIPAQUE IOHEXOL 300 MG/ML  SOLN COMPARISON:  CT abdomen pelvis 11/16/2010 FINDINGS: CT CHEST FINDINGS Cardiovascular: There are coronary artery and aortic atherosclerotic calcifications. There is a stent within the proximal left subclavian artery, but visualization of the stent lumen is limited. The subclavian artery distal to the stent is patent. Mediastinum/Nodes: No mediastinal hematoma. No mediastinal, hilar or axillary lymphadenopathy. Normal thyroid Lungs/Pleura: There is atelectasis at the right lung base. No pneumothorax or pulmonary contusion. Musculoskeletal: There are acute fractures of the left seventh, eighth and ninth ribs. There are multiple old right-sided rib fractures. Many of these are displaced and nonunited. No acute rib fracture. CT ABDOMEN PELVIS FINDINGS Hepatobiliary: No hepatic hematoma or laceration. No biliary dilatation. Status post cholecystectomy. Pancreas: Normal contours without ductal dilatation. No peripancreatic fluid collection. Spleen: No splenic laceration or hematoma. Adrenals/Urinary Tract: --Adrenal glands: No adrenal hemorrhage. --Right kidney/ureter: No hydronephrosis or perinephric hematoma. --Left kidney/ureter: No hydronephrosis or perinephric hematoma. --Urinary bladder: Unremarkable. Stomach/Bowel: --Stomach/Duodenum: No hiatal hernia or other gastric abnormality. Normal duodenal course and caliber. --Small bowel: No dilatation or inflammation. --Colon: No focal abnormality. --Appendix: Not visualized. No right lower quadrant  inflammation or free fluid. Vascular/Lymphatic: Atherosclerotic calcification is present within the non-aneurysmal abdominal aorta, without hemodynamically significant stenosis. No abdominal or pelvic lymphadenopathy. Reproductive: Unremarkable Musculoskeletal. Multilevel degenerative disc disease and facet  arthrosis. No bony spinal canal stenosis. Other: None. IMPRESSION: 1. Acute fractures of the left seventh, eighth and ninth ribs. No pneumothorax or pulmonary contusion. 2. Multiple old right rib fractures, some which remain displaced and non-united. 3. Coronary artery and Aortic Atherosclerosis (ICD10-I70.0). Electronically Signed   By: Ulyses Jarred M.D.   On: 08/19/2018 02:58   Dg Chest Portable 1 View  Result Date: 08/18/2018 CLINICAL DATA:  Left chest pain for the past 8 days and shortness of breath for the past 10 days. [place] 3 times in the last 2 days. Possible alcohol and drug use today. EXAM: PORTABLE CHEST 1 VIEW COMPARISON:  05/29/2018. FINDINGS: Normal sized heart. Previously demonstrated multiple right rib fractures without interval healing. There is a healing right posterior 5th rib fracture. Mild right basilar atelectasis or scarring. Stable post CABG changes. Moderate right shoulder degenerative changes and fixation screw. Left proximal common carotid artery stent. IMPRESSION: 1. Healing and non healed right rib fractures. 2. Mild right basilar atelectasis or scarring. Electronically Signed   By: Claudie Revering M.D.   On: 08/18/2018 23:54     Medical Consultants:    None.  Anti-Infectives:   None  Subjective:    [name] Pace is inebriated in a better mood does not allow interview.  Objective:    Vitals:   08/19/18 0400 08/19/18 0415 08/19/18 0500 08/19/18 0800  BP: (!) 141/65 (!) 145/60 97/67 118/81  Pulse:   97 92  Resp: (!) 21 (!) 25 (!) 22 (!) 25  Temp:   98.1 F (36.7 C) 97.9 F (36.6 C)  TempSrc:   Oral Oral  SpO2:   96%   Weight:   66.3 kg   Height:        No intake or output data in the 24 hours ending 08/19/18 0828 Filed Weights   08/19/18 0500  Weight: 66.3 kg    Exam: General exam: In no acute distress. Respiratory system: Good air movement and clear to auscultation. Cardiovascular system: S1 & S2 heard, RRR. Marland Kitchen  Gastrointestinal system: Abdomen is nondistended, soft and nontender.  Central nervous system: Moving all 4 extremities without any difficulties. Extremities: No pedal edema. Skin: No rashes, lesions or ulcers Psychiatry: Judgment and insight are poor, he still inebriated.   Data Reviewed:    Labs: Basic Metabolic Panel: Recent Labs  Lab 08/19/18 0027 08/19/18 0415  NA 120* 126*  K 3.3* 3.0*  CL 84* 87*  CO2 17* 25  GLUCOSE 88 98  BUN 6* 5*  CREATININE 0.62 0.80  CALCIUM 7.9* 7.9*  MG  --  2.0  PHOS  --  3.8   GFR Estimated Creatinine Clearance: 86.3 mL/min (by C-G formula based on SCr of 0.8 mg/dL). Liver Function Tests: Recent Labs  Lab 08/19/18 0027 08/19/18 0415  AST 23 18  ALT 16 16  ALKPHOS 106 101  BILITOT 0.5 0.7  PROT 6.3* 5.9*  ALBUMIN 2.6* 2.5*   Recent Labs  Lab 08/19/18 0027  LIPASE 31   No results for input(s): AMMONIA in the last 168 hours. Coagulation profile No results for input(s): INR, PROTIME in the last 168 hours.  CBC: Recent Labs  Lab 08/19/18 0027 08/19/18 0415  WBC 15.4* 15.6*  NEUTROABS 12.2*  --   HGB 15.9 16.0  HCT 45.5 44.5  MCV 93.8 93.5  PLT 267 277   Cardiac Enzymes: Recent Labs  Lab 08/19/18 0027 08/19/18 0334  CKTOTAL 52  --   TROPONINI <0.03 0.04*   BNP (  last 3 results) No results for input(s): PROBNP in the last 8760 hours. CBG: No results for input(s): GLUCAP in the last 168 hours. D-Dimer: No results for input(s): DDIMER in the last 72 hours. Hgb A1c: No results for input(s): HGBA1C in the last 72 hours. Lipid Profile: No results for input(s): CHOL, HDL, LDLCALC, TRIG, CHOLHDL, LDLDIRECT in the last 72 hours. Thyroid function  studies: No results for input(s): TSH, T4TOTAL, T3FREE, THYROIDAB in the last 72 hours.  Invalid input(s): FREET3 Anemia work up: No results for input(s): VITAMINB12, FOLATE, FERRITIN, TIBC, IRON, RETICCTPCT in the last 72 hours. Sepsis Labs: Recent Labs  Lab 08/19/18 0027 08/19/18 0028 08/19/18 0415 08/19/18 0604  WBC 15.4*  --  15.6*  --   LATICACIDVEN  --  1.9  --  2.4*   Microbiology No results found for this or any previous visit (from the past 240 hour(s)).   Medications:    aspirin EC  81 mg Oral Daily   enoxaparin (LOVENOX) injection  40 mg Subcutaneous Daily   folic acid  1 mg Intravenous Daily   multivitamin with minerals  1 tablet Oral Daily   thiamine  100 mg Oral Daily   Or   thiamine  100 mg Intravenous Daily   Continuous Infusions:  potassium chloride 10 mEq (08/19/18 0528)      LOS: 0 days   Charlynne Cousins  [facility]  08/19/2018, 8:28 AM

## 2018-08-19 NOTE — H&P (Signed)
[facility] History and Physical  [name] Pace BZJ:[id number] DOB: 10-Dec-1950 DOA: 08/18/2018  Referring physician:  PCP: System, Provider Not In   Chief Complaint: Chest pain: Multiple rib fractures, secondary to multiple falls  HPI: [name] Pace is a 68 y.o. male PMHx Breast cancer, Prostate cancer, TIA/CVA, HLD, tobacco abuse, CAD, EtOH abuse, Hx Delirium Tremens cocaine abuse, traumatic fractured ribs with pneumothorax, traumatic rupture right anterior tibial tendon intertrochanteric fracture LEFT femur  presenting with left-sided chest pain, abdominal pain and shortness of breath. Patient states this pain is been ongoing for the past 3 days after he is had multiple falls in the past several days. Describes pain to the left side of his chest and his abdomen that is constant. He also is complaining of some shortness of breath with some yellow mucus. EMS was told by family members he is been having chest pain or shortness of breath for the past 8 days. He is had several falls in the past several days. Admits to drinking 3 beers today with poor p.o. intake. Patient states his significant other called EMS today due to shortness of breath and chest pain. He has pain on the left side of his abdomen but no vomiting or diarrhea. No pain with urination or blood in the urine. No known fevers at home.      Review of Systems:  Constitutional:  No weight loss, night sweats, Fevers, chills, fatigue.  HEENT:  No headaches, Difficulty swallowing,Tooth/dental problems,Sore throat,  No sneezing, itching, ear ache, nasal congestion, post nasal drip,  Cardio-vascular:  No chest pain, Orthopnea, PND, swelling in lower extremities, anasarca, dizziness, palpitations  GI:  No heartburn, indigestion, abdominal pain, nausea, vomiting, diarrhea, change in bowel habits, loss of appetite  Resp:  No shortness of breath with exertion or at rest. No excess mucus, no productive cough, No non-productive cough,  No coughing up of blood.No change in color of mucus.No wheezing.No chest wall deformity  Skin:  no rash or lesions.  GU:  no dysuria, change in color of urine, no urgency or frequency. No flank pain.  Musculoskeletal:  No joint pain or swelling. No decreased range of motion. No back pain.  Psych:  No change in mood or affect. No depression or anxiety. No memory loss.   Past Medical History:  Diagnosis Date   Alcohol abuse    Anxiety    Breast cancer ([place]) Left breast   Per patient diagnosed in early 81's.    Cocaine abuse ([place]) 08/19/2018   Coronary artery disease    High cholesterol    Left rib fracture 08/19/2018   Prostate cancer ([place])    per patient diagnosed in early 50's   Stroke [facility])    Past Surgical History:  Procedure Laterality Date   CAROTID STENT     CORONARY ARTERY BYPASS GRAFT     FEMUR IM NAIL Left 07/02/2014   Procedure: INTRAMEDULLARY (IM) NAIL FEMORAL;  Surgeon: Elie Goody, MD;  Location: [facility];  Service: Orthopedics;  Laterality: Left;   I&D EXTREMITY Right 02/10/2016   Procedure: IRRIGATION AND DEBRIDEMENT EXTREMITY;  Surgeon: Dorna Leitz, MD;  Location: [place];  Service: Orthopedics;  Laterality: Right;   TENDON REPAIR Right 02/10/2016   Procedure: Anterior TENDON REPAIR and wound exploration.;  Surgeon: Dorna Leitz, MD;  Location: [place];  Service: Orthopedics;  Laterality: Right;   Social History:  reports that he has been smoking cigarettes. He has a 42.00 pack-year smoking history. He has never used  smokeless tobacco. He reports current alcohol use. He reports that he does not use drugs.  Allergies  Allergen Reactions   Penicillins Anaphylaxis    throat    Family History  Problem Relation Age of Onset   Dementia Mother   Patient unable to answer questions concerning his family history.    Prior to Admission medications   Medication Sig Start Date End Date Taking? Authorizing Provider  aspirin EC 325 MG EC tablet Take 1  tablet (325 mg total) by mouth 2 (two) times daily with a meal. 07/04/14   Debbe Odea, MD  folic acid (FOLVITE) 1 MG tablet Take 1 tablet (1 mg total) by mouth daily. 06/09/18   Debbe Odea, MD  lidocaine (LIDODERM) 5 % Place 2 patches onto the skin daily. Remove & Discard patch within 12 hours or as directed by MD 06/09/18   Debbe Odea, MD  LORazepam (ATIVAN) 0.5 MG tablet Take 1 tablet (0.5 mg total) by mouth every 8 (eight) hours as needed for anxiety. 06/09/18   Debbe Odea, MD  NUTRITIONAL SUPPLEMENT LIQD Take 120 mLs by mouth 2 (two) times daily. [employer]    [provider]  OLANZapine (ZYPREXA) 2.5 MG tablet Take 2 tablets (5 mg total) by mouth at bedtime. 06/09/18   Debbe Odea, MD  OLANZapine (ZYPREXA) 5 MG tablet Take 0.5 tablets (2.5 mg total) by mouth daily as needed (agitation). 06/09/18   Debbe Odea, MD  polyethylene glycol (MIRALAX / GLYCOLAX) packet Take 17 g by mouth daily as needed for moderate constipation or severe constipation. 06/09/18   Debbe Odea, MD  thiamine 100 MG tablet Take 1 tablet (100 mg total) by mouth daily. 06/09/18   Debbe Odea, MD  vitamin B-12 1000 MCG tablet Take 1 tablet (1,000 mcg total) by mouth daily. 06/09/18   Debbe Odea, MD     Consultants:  None  Procedures/Significant Events:  3/14 PCXR:-Healing and non healed right rib fractures. -Mild right basilar atelectasis or scarring. 3/14 CT head:-No CT evidence for acute intracranial abnormality. -Atrophy and small vessel ischemic changes of the white matter 3/15 CT chest:-Acute fractures of the LEFT seventh, eighth and ninth ribs. No pneumothorax or pulmonary contusion. -Multiple old right rib fractures, some which remain displaced and non-united. 3/15 CT abdomen and pelvis: Unremarkable Echocardiogram pending    I have personally reviewed and interpreted all radiology studies and my findings are as above.   VENTILATOR SETTINGS:    Cultures 3/15 influenza A/B  negative   Antimicrobials: None   Devices Negative  LINES / TUBES:  Negative    Continuous Infusions:  potassium chloride     banana bag IV 1000 mL      Physical Exam: Vitals:   08/19/18 0345 08/19/18 0400 08/19/18 0415 08/19/18 0500  BP: (!) 153/72 (!) 141/65 (!) 145/60 97/67  Pulse:    97  Resp: (!) 27 (!) 21 (!) 25 (!) 22  Temp:    98.1 F (36.7 C)  TempSrc:    Oral  SpO2:    96%  Weight:    66.3 kg  Height:        Wt Readings from Last 3 Encounters:  08/19/18 66.3 kg  06/12/18 64.2 kg  06/06/18 65.6 kg    General: A/O x1 (does not know where, when, why).  Does not follow commands.  No acute respiratory distress, cachectic, appears much older than stated age Eyes: negative scleral hemorrhage, negative anisocoria, negative icterus ENT: Negative Runny nose, negative gingival bleeding, Neck:  Negative scars, masses, torticollis, lymphadenopathy, JVD Lungs: Tachypneic, decreased breath sounds bibasilar, negative wheezes or crackles Cardiovascular: Tachycardic, without murmur gallop or rub normal S1 and S2 Abdomen: negative abdominal pain, nondistended, positive soft, bowel sounds, no rebound, no ascites, no appreciable mass Extremities: No significant cyanosis, clubbing, or edema bilateral lower extremities Skin: Multiple bruises, lacerations, scabs in various stages of healing.   Psychiatric: Unable to assess  Central nervous system: Patient withdraws to painful stimuli, A/O x1 (does not know where, when, why) does not follow commands.  Unable to complete exam         Labs on Admission:  Basic Metabolic Panel: Recent Labs  Lab 08/19/18 0027  NA 120*  K 3.3*  CL 84*  CO2 17*  GLUCOSE 88  BUN 6*  CREATININE 0.62  CALCIUM 7.9*   Liver Function Tests: Recent Labs  Lab 08/19/18 0027  AST 23  ALT 16  ALKPHOS 106  BILITOT 0.5  PROT 6.3*  ALBUMIN 2.6*   Recent Labs  Lab 08/19/18 0027  LIPASE 31   No results for input(s): AMMONIA in the  last 168 hours. CBC: Recent Labs  Lab 08/19/18 0027 08/19/18 0415  WBC 15.4* 15.6*  NEUTROABS 12.2*  --   HGB 15.9 16.0  HCT 45.5 44.5  MCV 93.8 93.5  PLT 267 277   Cardiac Enzymes: Recent Labs  Lab 08/19/18 0027 08/19/18 0334  CKTOTAL 52  --   TROPONINI <0.03 0.04*    BNP (last 3 results) No results for input(s): BNP in the last 8760 hours.  ProBNP (last 3 results) No results for input(s): PROBNP in the last 8760 hours.  CBG: No results for input(s): GLUCAP in the last 168 hours.  Radiological Exams on Admission: Ct Head Wo Contrast  Result Date: 08/19/2018 CLINICAL DATA:  History of multiple falls EXAM: CT HEAD WITHOUT CONTRAST TECHNIQUE: Contiguous axial images were obtained from the base of the skull through the vertex without intravenous contrast. COMPARISON:  MRI 06/04/2018, CT brain 05/30/2018 FINDINGS: Brain: No acute territorial infarction, hemorrhage, or intracranial mass. Mild to moderate atrophy. Moderate small vessel ischemic changes of the white matter. Chronic infarcts in the right thalamus and basal ganglia. Stable ventricle size Vascular: No hyperdense vessels.  Carotid vascular calcification Skull: Normal. Negative for fracture or focal lesion. Sinuses/Orbits: No acute finding. Mucosal thickening in the sphenoid sinuses Other: None IMPRESSION: 1. No CT evidence for acute intracranial abnormality. 2. Atrophy and small vessel ischemic changes of the white matter Electronically Signed   By: Donavan Foil M.D.   On: 08/19/2018 02:55   Ct Chest W Contrast  Result Date: 08/19/2018 CLINICAL DATA:  Fall. Blunt chest trauma. EXAM: CT CHEST, ABDOMEN, AND PELVIS WITH CONTRAST TECHNIQUE: Multidetector CT imaging of the chest, abdomen and pelvis was performed following the standard protocol during bolus administration of intravenous contrast. CONTRAST:  19mL OMNIPAQUE IOHEXOL 300 MG/ML  SOLN COMPARISON:  CT abdomen pelvis 11/16/2010 FINDINGS: CT CHEST FINDINGS  Cardiovascular: There are coronary artery and aortic atherosclerotic calcifications. There is a stent within the proximal left subclavian artery, but visualization of the stent lumen is limited. The subclavian artery distal to the stent is patent. Mediastinum/Nodes: No mediastinal hematoma. No mediastinal, hilar or axillary lymphadenopathy. Normal thyroid Lungs/Pleura: There is atelectasis at the right lung base. No pneumothorax or pulmonary contusion. Musculoskeletal: There are acute fractures of the left seventh, eighth and ninth ribs. There are multiple old right-sided rib fractures. Many of these are displaced and nonunited. No acute rib  fracture. CT ABDOMEN PELVIS FINDINGS Hepatobiliary: No hepatic hematoma or laceration. No biliary dilatation. Status post cholecystectomy. Pancreas: Normal contours without ductal dilatation. No peripancreatic fluid collection. Spleen: No splenic laceration or hematoma. Adrenals/Urinary Tract: --Adrenal glands: No adrenal hemorrhage. --Right kidney/ureter: No hydronephrosis or perinephric hematoma. --Left kidney/ureter: No hydronephrosis or perinephric hematoma. --Urinary bladder: Unremarkable. Stomach/Bowel: --Stomach/Duodenum: No hiatal hernia or other gastric abnormality. Normal duodenal course and caliber. --Small bowel: No dilatation or inflammation. --Colon: No focal abnormality. --Appendix: Not visualized. No right lower quadrant inflammation or free fluid. Vascular/Lymphatic: Atherosclerotic calcification is present within the non-aneurysmal abdominal aorta, without hemodynamically significant stenosis. No abdominal or pelvic lymphadenopathy. Reproductive: Unremarkable Musculoskeletal. Multilevel degenerative disc disease and facet arthrosis. No bony spinal canal stenosis. Other: None. IMPRESSION: 1. Acute fractures of the left seventh, eighth and ninth ribs. No pneumothorax or pulmonary contusion. 2. Multiple old right rib fractures, some which remain displaced and  non-united. 3. Coronary artery and Aortic Atherosclerosis (ICD10-I70.0). Electronically Signed   By: Ulyses Jarred M.D.   On: 08/19/2018 02:58   Ct Abdomen Pelvis W Contrast  Result Date: 08/19/2018 CLINICAL DATA:  Fall. Blunt chest trauma. EXAM: CT CHEST, ABDOMEN, AND PELVIS WITH CONTRAST TECHNIQUE: Multidetector CT imaging of the chest, abdomen and pelvis was performed following the standard protocol during bolus administration of intravenous contrast. CONTRAST:  1103mL OMNIPAQUE IOHEXOL 300 MG/ML  SOLN COMPARISON:  CT abdomen pelvis 11/16/2010 FINDINGS: CT CHEST FINDINGS Cardiovascular: There are coronary artery and aortic atherosclerotic calcifications. There is a stent within the proximal left subclavian artery, but visualization of the stent lumen is limited. The subclavian artery distal to the stent is patent. Mediastinum/Nodes: No mediastinal hematoma. No mediastinal, hilar or axillary lymphadenopathy. Normal thyroid Lungs/Pleura: There is atelectasis at the right lung base. No pneumothorax or pulmonary contusion. Musculoskeletal: There are acute fractures of the left seventh, eighth and ninth ribs. There are multiple old right-sided rib fractures. Many of these are displaced and nonunited. No acute rib fracture. CT ABDOMEN PELVIS FINDINGS Hepatobiliary: No hepatic hematoma or laceration. No biliary dilatation. Status post cholecystectomy. Pancreas: Normal contours without ductal dilatation. No peripancreatic fluid collection. Spleen: No splenic laceration or hematoma. Adrenals/Urinary Tract: --Adrenal glands: No adrenal hemorrhage. --Right kidney/ureter: No hydronephrosis or perinephric hematoma. --Left kidney/ureter: No hydronephrosis or perinephric hematoma. --Urinary bladder: Unremarkable. Stomach/Bowel: --Stomach/Duodenum: No hiatal hernia or other gastric abnormality. Normal duodenal course and caliber. --Small bowel: No dilatation or inflammation. --Colon: No focal abnormality. --Appendix: Not  visualized. No right lower quadrant inflammation or free fluid. Vascular/Lymphatic: Atherosclerotic calcification is present within the non-aneurysmal abdominal aorta, without hemodynamically significant stenosis. No abdominal or pelvic lymphadenopathy. Reproductive: Unremarkable Musculoskeletal. Multilevel degenerative disc disease and facet arthrosis. No bony spinal canal stenosis. Other: None. IMPRESSION: 1. Acute fractures of the left seventh, eighth and ninth ribs. No pneumothorax or pulmonary contusion. 2. Multiple old right rib fractures, some which remain displaced and non-united. 3. Coronary artery and Aortic Atherosclerosis (ICD10-I70.0). Electronically Signed   By: Ulyses Jarred M.D.   On: 08/19/2018 02:58   Dg Chest Portable 1 View  Result Date: 08/18/2018 CLINICAL DATA:  Left chest pain for the past 8 days and shortness of breath for the past 10 days. [place] 3 times in the last 2 days. Possible alcohol and drug use today. EXAM: PORTABLE CHEST 1 VIEW COMPARISON:  05/29/2018. FINDINGS: Normal sized heart. Previously demonstrated multiple right rib fractures without interval healing. There is a healing right posterior 5th rib fracture. Mild right basilar atelectasis or scarring. Stable post  CABG changes. Moderate right shoulder degenerative changes and fixation screw. Left proximal common carotid artery stent. IMPRESSION: 1. Healing and non healed right rib fractures. 2. Mild right basilar atelectasis or scarring. Electronically Signed   By: Claudie Revering M.D.   On: 08/18/2018 23:54    EKG: Sinus rhythm with PVC.  Non-specific ST T wave abnormalities  Assessment/Plan Active Problems:   Chest pain   Alcohol abuse   CAD (coronary artery disease)   Tobacco abuse   History of stroke   Multiple rib fractures   Delirium tremens ([place])   Acute respiratory failure (H[facility])   Encephalopathy acute   Cocaine abuse ([place])   Polysubstance abuse ([place])   Left rib fracture   [place]ia  Acute  encephalopathy/Hx CVA - Multiple causes, EtOH withdrawal, cocaine withdrawal, hyponatremia, and iatrogenic (CIWA protocol) -Head CT negative for acute CVA - Monitor closely for delirium tremens as on previous admission patient required Precedex - Currently not competent A/O x1 answers to his name.  Unsure of baseline given his long history of polysubstance abuse, and falls. - Psych consult placed for competency to make medical decisions: IVC upon discharge?  ETOH  [place]ia/Hyponatremia - Gentle hydration normal saline 44ml/hr -Avoid hyperosmolar saline as rapid correction would be detrimental.   Hypokalemia - Potassium goal> 4 - Potassium IV 50 mEq - Repeat K/Mg completion of administration of potassium  EtOH abuse - Continue CIWA protocol - Given patient's long history of alcohol and cocaine abuse obtain echocardiogram. -Monitor closely for delirium tremens as patient has previous history of requiring Precedex while hospitalized.  Cocaine abuse -See EtOH abuse  LEFT rib fracture - Pain control.  Given patient's current mental status would be very judicious with any pain medication.  CAD - Echocardiogram pending    Palliative care consult: Palliative care consult placed discuss changing patient's CODE STATUS to DNR/hospice given polysubstance abuse and multiple falls and multiple hospitalizations.   Code Status: Full (DVT Prophylaxis: Lovenox Family Communication: None Disposition Plan: TBD   Data Reviewed: Care during the described time interval was provided by me .  I have reviewed this patient's available data, including medical history, events of note, physical examination, and all test results as part of my evaluation.   Time spent: 60 min  [place] Pager [phone]

## 2018-08-19 NOTE — Progress Notes (Signed)
CRITICAL VALUE ALERT  Critical Value:  Troponin 0.04  Date & Time Notied:  05:00  Provider Notified: On-call [place]  Orders Received/Actions taken: pending

## 2018-08-20 DIAGNOSIS — G92 Toxic encephalopathy: Secondary | ICD-10-CM

## 2018-08-20 LAB — BASIC METABOLIC PANEL
Anion gap: 9 (ref 5–15)
Anion gap: 11 (ref 5–15)
BUN: 6 mg/dL — AB (ref 8–23)
BUN: 7 mg/dL — ABNORMAL LOW (ref 8–23)
CO2: 22 mmol/L (ref 22–32)
CO2: 23 mmol/L (ref 22–32)
Calcium: 7.4 mg/dL — ABNORMAL LOW (ref 8.9–10.3)
Calcium: 8.1 mg/dL — ABNORMAL LOW (ref 8.9–10.3)
Chloride: 95 mmol/L — ABNORMAL LOW (ref 98–111)
Chloride: 100 mmol/L (ref 98–111)
Creatinine, Ser: 0.69 mg/dL (ref 0.61–1.24)
Creatinine, Ser: 0.84 mg/dL (ref 0.61–1.24)
GFR calc Af Amer: >60 mL/min (ref >60)
GFR calc non Af Amer: >60 mL/min (ref >60)
Glucose, Bld: 118 mg/dL — ABNORMAL HIGH (ref 70–99)
Glucose, Bld: 92 mg/dL (ref 70–99)
Potassium: 3.6 mmol/L (ref 3.5–5.1)
Potassium: 4 mmol/L (ref 3.5–5.1)
Sodium: 126 mmol/L — ABNORMAL LOW (ref 135–145)
Sodium: 134 mmol/L — ABNORMAL LOW (ref 135–145)

## 2018-08-20 MED ORDER — ONDANSETRON HCL 4 MG/2ML IJ SOLN
4.0000 mg | Freq: Four times a day (QID) | INTRAMUSCULAR | Status: DC | PRN
  Administered 2018-08-20: 4 mg via INTRAVENOUS
  Filled 2018-08-20: qty 2

## 2018-08-20 MED ORDER — SODIUM CHLORIDE 0.9 % IV SOLN
INTRAVENOUS | Status: AC
  Administered 2018-08-20 – 2018-08-21 (×3): via INTRAVENOUS

## 2018-08-20 MED ORDER — FOLIC ACID 1 MG PO TABS
1.0000 mg | ORAL_TABLET | Freq: Every day | ORAL | Status: DC
  Administered 2018-08-20 – 2018-08-21 (×2): 1 mg via ORAL
  Filled 2018-08-20: qty 1

## 2018-08-20 NOTE — TOC Initial Note (Signed)
Transition of Care [facility]) - Initial/Assessment Note    Patient Details  Name: [name] Pace MRN: [id number] Date of Birth: [birth year]  [facility] CM/SW Contact:    Vinie Sill, [place] Phone Number: 08/20/2018, 2:29 PM  Clinical Narrative:                 CSW visit with the patient at bedside. He was alert and oriented. Patient states he lives in a single level home with his girlfriend. Patient was discharged to SNF [facility]) on 06/2018, he left AMA on 06/26/2018( he used 18 days). Patient  refused SNF placement  and states he wants to go home.  Patient gave CSW permission to contact his daughter,Katie to provide update. CSW updated RN and paged MD to advise-awaiting call back.  Patient states he drinks 6 pack of beer every other day and sometimes he snorts cocaine. Patient states he is involved with AA but he needs to go to meetings more often. CSW provided the patient with inpatient and outpatient substance abuse treatment facilities listings.   Expected Discharge Plan: Home/Self Care(patient refusing SNF) Barriers to Discharge: Continued Medical Work up, Active Substance Use - Placement   Patient Goals and CMS Choice Patient states their goals for this hospitalization and ongoing recovery are:: patient wants to go home and to [place] meetings       Expected Discharge Plan and Services Expected Discharge Plan: Home/Self Care(patient refusing SNF)     Living arrangements for the past 2 months: Single Family Home                          Prior Living Arrangements/Services Living arrangements for the past 2 months: Single Family Home Lives with:: Self, Significant Other Patient language and need for interpreter reviewed:: No Do you feel safe going back to the place where you live?: Yes      Need for Family Participation in Patient Care: Yes (Comment) Care giver support system in place?: Yes (comment)   Criminal Activity/Legal Involvement Pertinent to Current  Situation/Hospitalization: No - Comment as needed  Activities of Daily Living      Permission Sought/Granted Permission sought to share information with : Family Supports Permission granted to share information with : Yes, Verbal Permission Granted  Share Information with NAME: Ellwood Steidle      Permission granted to share info w Relationship: daughter   Permission granted to share info w Contact Information: [phone]  Emotional Assessment Appearance:: Appears stated age Attitude/Demeanor/Rapport: Engaged Affect (typically observed): Accepting, Appropriate, Pleasant Orientation: : Oriented to Self, Oriented to Place, Oriented to  Time, Oriented to Situation Alcohol / Substance Use: Tobacco Use, Alcohol Use, Illicit Drugs Psych Involvement: Yes (comment)(psyc was consulted)  Admission diagnosis:  Alcohol abuse [F10.10] Hyponatremia [E87.1] Atypical chest pain [R07.89] Closed fracture of multiple ribs of left side, initial encounter [S22.42XA] Patient Active Problem List   Diagnosis Date Noted  . Cocaine abuse ([place]) 08/19/2018  . Polysubstance abuse ([place]) 08/19/2018  . Left rib fracture 08/19/2018  . Potomania 08/19/2018  . Alcohol abuse with intoxication ([place]) 08/19/2018  . At risk for adverse drug event 06/12/2018  . Palliative care encounter   . Encephalopathy acute   . Delirium tremens ([place]) 05/18/2018  . Malnutrition of moderate degree 05/18/2018  . Acute respiratory failure ([place])   . Shock circulatory ([place])   . Multiple rib fractures 05/14/2018  . Pneumothorax, closed, traumatic, initial encounter 05/14/2018  . Traumatic fracture  of ribs with pneumothorax   . Laceration of muscle(s) and tendon(s) of anterior muscle group at lower leg level, right leg, initial encounter 02/10/2016  . Traumatic rupture of right anterior tibial tendon 02/10/2016  . Hip fracture, left ([place]) 07/02/2014  . Closed left hip fracture ([place]) 07/02/2014  . Hip fracture ([place]) 07/02/2014  .  Intertrochanteric fracture of left femur ([place]) 07/02/2014  . History of stroke   . Preop examination   . Subclavian artery stenosis, left ([place]) 06/15/2014  . Chest pain 06/14/2014  . CAP (community acquired pneumonia) 06/14/2014  . Sepsis ([place]) 06/14/2014  . Alcohol abuse 06/14/2014  . CAD (coronary artery disease) 06/14/2014  . Tobacco abuse 06/14/2014   PCP:  System, Provider Not In Pharmacy:   [facility] [place] [facility] [address] [place] [address] Phone: [phone] Fax: [phone]     Social Determinants of Health (SDOH) Interventions    Readmission Risk Interventions 30 Day Unplanned Readmission Risk Score     ED to Hosp-Admission (Current) from 08/18/2018 in [facility] 4E CV SURGICAL PROGRESSIVE CARE  30 Day Unplanned Readmission Risk Score (%)  29 Filed at 08/20/2018 1200     This score is the patient's risk of an unplanned readmission within 30 days of being discharged (0 -100%). The score is based on dignosis, age, lab data, medications, orders, and past utilization.   Low:  0-14.9   Medium: 15-21.9   High: 22-29.9   Extreme: 30 and above       No flowsheet data found.

## 2018-08-20 NOTE — Progress Notes (Signed)
[facility] PROGRESS NOTE    Progress Note  [name] Pace  NTI:[id number] DOB: 01-16-1951 DOA: 08/18/2018 PCP: System, Provider Not In     Brief Narrative:   [name] Pace is an 68 y.o. male past medical history of prostate cancer TIA alcohol abuse history of delirium tremens, cocaine abuse who comes in for chest pain multiple rib fractures secondary to multiple falls, he relates his chest pain started after his multiple falls accompanied by shortness of breath started 8 days prior to admission.  Assessment/Plan:   Acute encephalopathy: His encephalopathy is likely due to alcohol intoxication. Today he is more awake has no complaints he relates he drinks almost daily.  His last drink was prior to admission. Afebrile no leukocytosis culture has remained negative.  Alcohol abuse: Has had a long history of alcohol and cocaine. Continue monitor with CIWA score is low, continue oral thiamine and folate.  Chest pain/SOB and left rib fractures: CT of the chest on 08/19/2018 showed multiple acute rib fractures 7 through 9 no pneumothorax.  Multiple old fractures.  UDS was positive for cocaine. We will use tramadol for pain. Physical therapy evaluated the patient recommended skilled nursing facility. Insult Education officer, museum.  Hyponatremia/[place]ia: Continue normal saline sodium is improving nicely about 8 medical nights in a 24-hour period.  Hypokalemia: Replete orally recheck in the morning.  Leukocytosis: Likely due to new fractures.  Cocaine abuse  Tobacco abuse   DVT prophylaxis: lovenox Family Communication:None Disposition Plan/Barrier to D/C: SNF in 1 day Code Status:     Code Status Orders  (From admission, onward)         Start     Ordered   08/19/18 0352  Full code  Continuous     08/19/18 0355        Code Status History    Date Active Date Inactive Code Status Order ID Comments User Context   05/14/2018 0217 06/09/2018 2319 Full Code [id number]  [name], MD ED   07/02/2014 2126 07/04/2014 1617 Full Code [id number]  [name], [name], MD Inpatient   07/02/2014 0722 07/02/2014 2126 Full Code [id number]  [name], MD ED   06/14/2014 2332 06/18/2014 2007 Full Code [id number]  [name], MD Inpatient        IV Access:    Peripheral IV   Procedures and diagnostic studies:   Ct Head Wo Contrast  Result Date: 08/19/2018 CLINICAL DATA:  History of multiple falls EXAM: CT HEAD WITHOUT CONTRAST TECHNIQUE: Contiguous axial images were obtained from the base of the skull through the vertex without intravenous contrast. COMPARISON:  MRI 06/04/2018, CT brain 05/30/2018 FINDINGS: Brain: No acute territorial infarction, hemorrhage, or intracranial mass. Mild to moderate atrophy. Moderate small vessel ischemic changes of the white matter. Chronic infarcts in the right thalamus and basal ganglia. Stable ventricle size Vascular: No hyperdense vessels.  Carotid vascular calcification Skull: Normal. Negative for fracture or focal lesion. Sinuses/Orbits: No acute finding. Mucosal thickening in the sphenoid sinuses Other: None IMPRESSION: 1. No CT evidence for acute intracranial abnormality. 2. Atrophy and small vessel ischemic changes of the white matter Electronically Signed   By: Donavan Foil M.D.   On: 08/19/2018 02:55   Ct Chest W Contrast  Result Date: 08/19/2018 CLINICAL DATA:  Fall. Blunt chest trauma. EXAM: CT CHEST, ABDOMEN, AND PELVIS WITH CONTRAST TECHNIQUE: Multidetector CT imaging of the chest, abdomen and pelvis was performed following the standard protocol during bolus administration of intravenous contrast. CONTRAST:  179mL OMNIPAQUE  IOHEXOL 300 MG/ML  SOLN COMPARISON:  CT abdomen pelvis 11/16/2010 FINDINGS: CT CHEST FINDINGS Cardiovascular: There are coronary artery and aortic atherosclerotic calcifications. There is a stent within the proximal left subclavian artery, but visualization of the stent lumen is limited. The subclavian  artery distal to the stent is patent. Mediastinum/Nodes: No mediastinal hematoma. No mediastinal, hilar or axillary lymphadenopathy. Normal thyroid Lungs/Pleura: There is atelectasis at the right lung base. No pneumothorax or pulmonary contusion. Musculoskeletal: There are acute fractures of the left seventh, eighth and ninth ribs. There are multiple old right-sided rib fractures. Many of these are displaced and nonunited. No acute rib fracture. CT ABDOMEN PELVIS FINDINGS Hepatobiliary: No hepatic hematoma or laceration. No biliary dilatation. Status post cholecystectomy. Pancreas: Normal contours without ductal dilatation. No peripancreatic fluid collection. Spleen: No splenic laceration or hematoma. Adrenals/Urinary Tract: --Adrenal glands: No adrenal hemorrhage. --Right kidney/ureter: No hydronephrosis or perinephric hematoma. --Left kidney/ureter: No hydronephrosis or perinephric hematoma. --Urinary bladder: Unremarkable. Stomach/Bowel: --Stomach/Duodenum: No hiatal hernia or other gastric abnormality. Normal duodenal course and caliber. --Small bowel: No dilatation or inflammation. --Colon: No focal abnormality. --Appendix: Not visualized. No right lower quadrant inflammation or free fluid. Vascular/Lymphatic: Atherosclerotic calcification is present within the non-aneurysmal abdominal aorta, without hemodynamically significant stenosis. No abdominal or pelvic lymphadenopathy. Reproductive: Unremarkable Musculoskeletal. Multilevel degenerative disc disease and facet arthrosis. No bony spinal canal stenosis. Other: None. IMPRESSION: 1. Acute fractures of the left seventh, eighth and ninth ribs. No pneumothorax or pulmonary contusion. 2. Multiple old right rib fractures, some which remain displaced and non-united. 3. Coronary artery and Aortic Atherosclerosis (ICD10-I70.0). Electronically Signed   By: Ulyses Jarred M.D.   On: 08/19/2018 02:58   Ct Abdomen Pelvis W Contrast  Result Date: 08/19/2018 CLINICAL  DATA:  Fall. Blunt chest trauma. EXAM: CT CHEST, ABDOMEN, AND PELVIS WITH CONTRAST TECHNIQUE: Multidetector CT imaging of the chest, abdomen and pelvis was performed following the standard protocol during bolus administration of intravenous contrast. CONTRAST:  135mL OMNIPAQUE IOHEXOL 300 MG/ML  SOLN COMPARISON:  CT abdomen pelvis 11/16/2010 FINDINGS: CT CHEST FINDINGS Cardiovascular: There are coronary artery and aortic atherosclerotic calcifications. There is a stent within the proximal left subclavian artery, but visualization of the stent lumen is limited. The subclavian artery distal to the stent is patent. Mediastinum/Nodes: No mediastinal hematoma. No mediastinal, hilar or axillary lymphadenopathy. Normal thyroid Lungs/Pleura: There is atelectasis at the right lung base. No pneumothorax or pulmonary contusion. Musculoskeletal: There are acute fractures of the left seventh, eighth and ninth ribs. There are multiple old right-sided rib fractures. Many of these are displaced and nonunited. No acute rib fracture. CT ABDOMEN PELVIS FINDINGS Hepatobiliary: No hepatic hematoma or laceration. No biliary dilatation. Status post cholecystectomy. Pancreas: Normal contours without ductal dilatation. No peripancreatic fluid collection. Spleen: No splenic laceration or hematoma. Adrenals/Urinary Tract: --Adrenal glands: No adrenal hemorrhage. --Right kidney/ureter: No hydronephrosis or perinephric hematoma. --Left kidney/ureter: No hydronephrosis or perinephric hematoma. --Urinary bladder: Unremarkable. Stomach/Bowel: --Stomach/Duodenum: No hiatal hernia or other gastric abnormality. Normal duodenal course and caliber. --Small bowel: No dilatation or inflammation. --Colon: No focal abnormality. --Appendix: Not visualized. No right lower quadrant inflammation or free fluid. Vascular/Lymphatic: Atherosclerotic calcification is present within the non-aneurysmal abdominal aorta, without hemodynamically significant stenosis. No  abdominal or pelvic lymphadenopathy. Reproductive: Unremarkable Musculoskeletal. Multilevel degenerative disc disease and facet arthrosis. No bony spinal canal stenosis. Other: None. IMPRESSION: 1. Acute fractures of the left seventh, eighth and ninth ribs. No pneumothorax or pulmonary contusion. 2. Multiple old right rib fractures, some which  remain displaced and non-united. 3. Coronary artery and Aortic Atherosclerosis (ICD10-I70.0). Electronically Signed   By: Ulyses Jarred M.D.   On: 08/19/2018 02:58   Dg Chest Portable 1 View  Result Date: 08/18/2018 CLINICAL DATA:  Left chest pain for the past 8 days and shortness of breath for the past 10 days. [place] 3 times in the last 2 days. Possible alcohol and drug use today. EXAM: PORTABLE CHEST 1 VIEW COMPARISON:  05/29/2018. FINDINGS: Normal sized heart. Previously demonstrated multiple right rib fractures without interval healing. There is a healing right posterior 5th rib fracture. Mild right basilar atelectasis or scarring. Stable post CABG changes. Moderate right shoulder degenerative changes and fixation screw. Left proximal common carotid artery stent. IMPRESSION: 1. Healing and non healed right rib fractures. 2. Mild right basilar atelectasis or scarring. Electronically Signed   By: Claudie Revering M.D.   On: 08/18/2018 23:54     Medical Consultants:    None.  Anti-Infectives:   None  Subjective:    RAMESH MOAN relates he has no new complaints his pain is controlled tolerating his diet.  Objective:    Vitals:   08/19/18 2000 08/19/18 2023 08/20/18 0249 08/20/18 0500  BP:   (!) 154/80   Pulse:   80   Resp: 20 20 16    Temp:  98 F (36.7 C) 98.2 F (36.8 C)   TempSrc:  Oral Oral   SpO2:   90%   Weight:    66.3 kg  Height:        Intake/Output Summary (Last 24 hours) at 08/20/2018 0748 Last data filed at 08/20/2018 0645 Gross per 24 hour  Intake 1380 ml  Output 1076 ml  Net 304 ml   Filed Weights   08/19/18 0500 08/20/18  0500  Weight: 66.3 kg 66.3 kg    Exam: General exam: In no acute distress. Respiratory system: Good air movement and clear to auscultation.  Tender around his rib cage. Cardiovascular system: S1 & S2 heard, RRR. Marland Kitchen  Gastrointestinal system: Abdomen is nondistended, soft and nontender.  Central nervous system: Moving all 4 extremities without any difficulties. Extremities: No pedal edema. Skin: No rashes, lesions or ulcers Psychiatry: Judgment and insight are poor, he still inebriated.   Data Reviewed:    Labs: Basic Metabolic Panel: Recent Labs  Lab 08/19/18 0027 08/19/18 0415 08/19/18 1126 08/20/18 0019  NA 120* 126* 122* 126*  K 3.3* 3.0* 4.1 3.6  CL 84* 87* 96* 95*  CO2 17* 25 15* 22  GLUCOSE 88 98 91 118*  BUN 6* 5* 7* 7*  CREATININE 0.62 0.80 0.74 0.69  CALCIUM 7.9* 7.9* 7.3* 7.4*  MG  --  2.0  --   --   PHOS  --  3.8  --   --    GFR Estimated Creatinine Clearance: 86.3 mL/min (by C-G formula based on SCr of 0.69 mg/dL). Liver Function Tests: Recent Labs  Lab 08/19/18 0027 08/19/18 0415  AST 23 18  ALT 16 16  ALKPHOS 106 101  BILITOT 0.5 0.7  PROT 6.3* 5.9*  ALBUMIN 2.6* 2.5*   Recent Labs  Lab 08/19/18 0027  LIPASE 31   No results for input(s): AMMONIA in the last 168 hours. Coagulation profile No results for input(s): INR, PROTIME in the last 168 hours.  CBC: Recent Labs  Lab 08/19/18 0027 08/19/18 0415 08/19/18 0833  WBC 15.4* 15.6* 14.8*  NEUTROABS 12.2*  --  11.5*  HGB 15.9 16.0 15.6  HCT 45.5 44.5  44.1  MCV 93.8 93.5 92.6  PLT 267 277 257   Cardiac Enzymes: Recent Labs  Lab 08/19/18 0027 08/19/18 0334  CKTOTAL 52  --   TROPONINI <0.03 0.04*   BNP (last 3 results) No results for input(s): PROBNP in the last 8760 hours. CBG: No results for input(s): GLUCAP in the last 168 hours. D-Dimer: No results for input(s): DDIMER in the last 72 hours. Hgb A1c: No results for input(s): HGBA1C in the last 72 hours. Lipid Profile: No  results for input(s): CHOL, HDL, LDLCALC, TRIG, CHOLHDL, LDLDIRECT in the last 72 hours. Thyroid function studies: No results for input(s): TSH, T4TOTAL, T3FREE, THYROIDAB in the last 72 hours.  Invalid input(s): FREET3 Anemia work up: No results for input(s): VITAMINB12, FOLATE, FERRITIN, TIBC, IRON, RETICCTPCT in the last 72 hours. Sepsis Labs: Recent Labs  Lab 08/19/18 0027 08/19/18 0028 08/19/18 0415 08/19/18 0604 08/19/18 0833  WBC 15.4*  --  15.6*  --  14.8*  LATICACIDVEN  --  1.9  --  2.4*  --    Microbiology No results found for this or any previous visit (from the past 240 hour(s)).   Medications:    aspirin  325 mg Oral BID WC   aspirin EC  81 mg Oral Daily   enoxaparin (LOVENOX) injection  40 mg Subcutaneous Daily   folic acid  1 mg Intravenous Daily   multivitamin with minerals  1 tablet Oral Daily   OLANZapine  5 mg Oral QHS   polyethylene glycol  17 g Oral Daily   potassium chloride  40 mEq Oral BID   thiamine  100 mg Oral Daily   Or   thiamine  100 mg Intravenous Daily   Continuous Infusions:  sodium chloride 100 mL/hr at 08/20/18 0645      LOS: 1 day   Charlynne Cousins  [facility]  08/20/2018, 7:48 AM

## 2018-08-21 ENCOUNTER — Encounter (HOSPITAL_COMMUNITY): Payer: Self-pay

## 2018-08-21 DIAGNOSIS — S2241XA Multiple fractures of ribs, right side, initial encounter for closed fracture: Secondary | ICD-10-CM

## 2018-08-21 LAB — BASIC METABOLIC PANEL
Anion gap: 10 (ref 5–15)
BUN: 9 mg/dL (ref 8–23)
CO2: 20 mmol/L — ABNORMAL LOW (ref 22–32)
Calcium: 7.6 mg/dL — ABNORMAL LOW (ref 8.9–10.3)
Chloride: 101 mmol/L (ref 98–111)
Creatinine, Ser: 0.75 mg/dL (ref 0.61–1.24)
GFR calc Af Amer: >60 mL/min (ref >60)
GFR calc non Af Amer: >60 mL/min (ref >60)
Glucose, Bld: 113 mg/dL — ABNORMAL HIGH (ref 70–99)
Potassium: 4.4 mmol/L (ref 3.5–5.1)
Sodium: 131 mmol/L — ABNORMAL LOW (ref 135–145)

## 2018-08-21 NOTE — Care Management (Signed)
1314 08-21-18 Patient had discharged home by the time Case manager was able to see the patient. CM did call the patient on cell phone at [phone] and discussed home health care with the patient. Patient states he was open for discussion, however declined. Patient stated he needs PCP- CM did make him aware of how to do so via insurance. Once he has PCP he needs appointment and then if he is up for home health coming into the home the PCP can order and set up. No further needs from CM at this time. Bethena Roys, RN,BSN Case Manager [phone]

## 2018-08-21 NOTE — Discharge Summary (Signed)
Physician Discharge Summary  [name] Pace XLK:[id number] DOB: [birth year] DOA: 08/18/2018  PCP: System, Provider Not In  Admit date: 08/18/2018 Discharge date: 08/21/2018  Admitted From: Home Disposition:  Home  Recommendations for Outpatient Follow-up:  1. Follow up with PCP in 1-2 weeks 2. Your follow-up with [facility] as an outpatient. 3. Health follow-up patient for physical therapy at home as he refused skilled nursing facility.  Home Health: yes Equipment/Devices:None  Discharge Condition:Stable CODE STATUS:Full Diet recommendation: Heart Healthy   Brief/Interim Summary: 68 y.o. male past medical history of prostate cancer TIA alcohol abuse history of delirium tremens, cocaine abuse who comes in for chest pain multiple rib fractures secondary to multiple falls, he relates his chest pain started after his multiple falls accompanied by shortness of breath started 8 days prior to admission.  Discharge Diagnoses:  Active Problems:   Chest pain   Alcohol abuse   CAD (coronary artery disease)   Tobacco abuse   History of stroke   Multiple rib fractures   Delirium tremens ([place])   Acute respiratory failure (HCC)   Encephalopathy acute   Cocaine abuse ([place])   Polysubstance abuse ([place])   Left rib fracture   [place]omania   Alcohol abuse with intoxication ([place])  Acute metabolic encephalopathy due to alcohol intoxication: He was treated conservatively with IV fluids thiamine and folate. His sodium was mildly low which could be contributing to his encephalopathy and that has improved. He remained afebrile with no leukocytosis culture data remain negative.   He is monitor with CIWA protocol and his score remains low.  He will go home on thiamine and folate.  Chest pain/shortness of breath due to multiple left rib fractures in the setting of mechanical falls due to inebriation: CT of the chest on 08/19/2018 show multiple rib fractures 7 through 9 no pneumothorax and multiple old healed  fractures. His UDS was positive for cocaine. He was given tramadol for pain which control his symptoms. Physical therapy evaluated the patient and recommended skilled nursing facility, but the patient refused skilled. I did explain the risk and benefits of going home versus going to a skilled nursing and he seems to understand he can tell you the risk and consequences of his decision. He is awake alert and has a coherent and concrete idea of the consequences of going home.  Hyponatremia: Likely due to beer portal mania this resolved with IV fluid hydration.  Hypokalemia: Replete orally now resolved.  LeuCytosis: Likely reactive in the setting of fractures he remained afebrile.  Cocaine/tobacco abuse: Counseling was performed.  Discharge Instructions  Discharge Instructions    Diet - low sodium heart healthy   Complete by:  As directed    Increase activity slowly   Complete by:  As directed      Allergies as of 08/21/2018      Reactions   Penicillins Anaphylaxis   Did it involve swelling of the face/tongue/throat, SOB, or low BP? Yes Did it involve sudden or severe rash/hives, skin peeling, or any reaction on the inside of your mouth or nose? No Did you need to seek medical attention at a hospital or doctor's office? No When did it last happen?childhood If all above answers are "NO", may proceed with cephalosporin use.      Medication List    TAKE these medications   aspirin EC 81 MG tablet Take 81 mg by mouth daily. What changed:  Another medication with the same name was removed. Continue taking this medication, and follow  the directions you see here.   cyanocobalamin 1000 MCG tablet Take 1 tablet (1,000 mcg total) by mouth daily.   folic acid 1 MG tablet Commonly known as:  FOLVITE Take 1 tablet (1 mg total) by mouth daily.   lidocaine 5 % Commonly known as:  LIDODERM Place 2 patches onto the skin daily. Remove & Discard patch within 12 hours or as  directed by MD   LORazepam 0.5 MG tablet Commonly known as:  ATIVAN Take 1 tablet (0.5 mg total) by mouth every 8 (eight) hours as needed for anxiety.   OLANZapine 2.5 MG tablet Commonly known as:  ZYPREXA Take 2 tablets (5 mg total) by mouth at bedtime.   OLANZapine 5 MG tablet Commonly known as:  ZYPREXA Take 0.5 tablets (2.5 mg total) by mouth daily as needed (agitation).   polyethylene glycol packet Commonly known as:  MIRALAX / GLYCOLAX Take 17 g by mouth daily as needed for moderate constipation or severe constipation.   thiamine 100 MG tablet Take 1 tablet (100 mg total) by mouth daily.       Allergies  Allergen Reactions  . Penicillins Anaphylaxis    Did it involve swelling of the face/tongue/throat, SOB, or low BP? Yes Did it involve sudden or severe rash/hives, skin peeling, or any reaction on the inside of your mouth or nose? No Did you need to seek medical attention at a hospital or doctor's office? No When did it last happen?childhood If all above answers are "NO", may proceed with cephalosporin use.     Consultations:  None   Procedures/Studies: Ct Head Wo Contrast  Result Date: 08/19/2018 CLINICAL DATA:  History of multiple falls EXAM: CT HEAD WITHOUT CONTRAST TECHNIQUE: Contiguous axial images were obtained from the base of the skull through the vertex without intravenous contrast. COMPARISON:  MRI 06/04/2018, CT brain 05/30/2018 FINDINGS: Brain: No acute territorial infarction, hemorrhage, or intracranial mass. Mild to moderate atrophy. Moderate small vessel ischemic changes of the white matter. Chronic infarcts in the right thalamus and basal ganglia. Stable ventricle size Vascular: No hyperdense vessels.  Carotid vascular calcification Skull: Normal. Negative for fracture or focal lesion. Sinuses/Orbits: No acute finding. Mucosal thickening in the sphenoid sinuses Other: None IMPRESSION: 1. No CT evidence for acute intracranial abnormality. 2.  Atrophy and small vessel ischemic changes of the white matter Electronically Signed   By: Donavan Foil M.D.   On: 08/19/2018 02:55   Ct Chest W Contrast  Result Date: 08/19/2018 CLINICAL DATA:  Fall. Blunt chest trauma. EXAM: CT CHEST, ABDOMEN, AND PELVIS WITH CONTRAST TECHNIQUE: Multidetector CT imaging of the chest, abdomen and pelvis was performed following the standard protocol during bolus administration of intravenous contrast. CONTRAST:  139mL OMNIPAQUE IOHEXOL 300 MG/ML  SOLN COMPARISON:  CT abdomen pelvis 11/16/2010 FINDINGS: CT CHEST FINDINGS Cardiovascular: There are coronary artery and aortic atherosclerotic calcifications. There is a stent within the proximal left subclavian artery, but visualization of the stent lumen is limited. The subclavian artery distal to the stent is patent. Mediastinum/Nodes: No mediastinal hematoma. No mediastinal, hilar or axillary lymphadenopathy. Normal thyroid Lungs/Pleura: There is atelectasis at the right lung base. No pneumothorax or pulmonary contusion. Musculoskeletal: There are acute fractures of the left seventh, eighth and ninth ribs. There are multiple old right-sided rib fractures. Many of these are displaced and nonunited. No acute rib fracture. CT ABDOMEN PELVIS FINDINGS Hepatobiliary: No hepatic hematoma or laceration. No biliary dilatation. Status post cholecystectomy. Pancreas: Normal contours without ductal dilatation. No peripancreatic fluid  collection. Spleen: No splenic laceration or hematoma. Adrenals/Urinary Tract: --Adrenal glands: No adrenal hemorrhage. --Right kidney/ureter: No hydronephrosis or perinephric hematoma. --Left kidney/ureter: No hydronephrosis or perinephric hematoma. --Urinary bladder: Unremarkable. Stomach/Bowel: --Stomach/Duodenum: No hiatal hernia or other gastric abnormality. Normal duodenal course and caliber. --Small bowel: No dilatation or inflammation. --Colon: No focal abnormality. --Appendix: Not visualized. No right  lower quadrant inflammation or free fluid. Vascular/Lymphatic: Atherosclerotic calcification is present within the non-aneurysmal abdominal aorta, without hemodynamically significant stenosis. No abdominal or pelvic lymphadenopathy. Reproductive: Unremarkable Musculoskeletal. Multilevel degenerative disc disease and facet arthrosis. No bony spinal canal stenosis. Other: None. IMPRESSION: 1. Acute fractures of the left seventh, eighth and ninth ribs. No pneumothorax or pulmonary contusion. 2. Multiple old right rib fractures, some which remain displaced and non-united. 3. Coronary artery and Aortic Atherosclerosis (ICD10-I70.0). Electronically Signed   By: Ulyses Jarred M.D.   On: 08/19/2018 02:58   Ct Abdomen Pelvis W Contrast  Result Date: 08/19/2018 CLINICAL DATA:  Fall. Blunt chest trauma. EXAM: CT CHEST, ABDOMEN, AND PELVIS WITH CONTRAST TECHNIQUE: Multidetector CT imaging of the chest, abdomen and pelvis was performed following the standard protocol during bolus administration of intravenous contrast. CONTRAST:  113mL OMNIPAQUE IOHEXOL 300 MG/ML  SOLN COMPARISON:  CT abdomen pelvis 11/16/2010 FINDINGS: CT CHEST FINDINGS Cardiovascular: There are coronary artery and aortic atherosclerotic calcifications. There is a stent within the proximal left subclavian artery, but visualization of the stent lumen is limited. The subclavian artery distal to the stent is patent. Mediastinum/Nodes: No mediastinal hematoma. No mediastinal, hilar or axillary lymphadenopathy. Normal thyroid Lungs/Pleura: There is atelectasis at the right lung base. No pneumothorax or pulmonary contusion. Musculoskeletal: There are acute fractures of the left seventh, eighth and ninth ribs. There are multiple old right-sided rib fractures. Many of these are displaced and nonunited. No acute rib fracture. CT ABDOMEN PELVIS FINDINGS Hepatobiliary: No hepatic hematoma or laceration. No biliary dilatation. Status post cholecystectomy. Pancreas:  Normal contours without ductal dilatation. No peripancreatic fluid collection. Spleen: No splenic laceration or hematoma. Adrenals/Urinary Tract: --Adrenal glands: No adrenal hemorrhage. --Right kidney/ureter: No hydronephrosis or perinephric hematoma. --Left kidney/ureter: No hydronephrosis or perinephric hematoma. --Urinary bladder: Unremarkable. Stomach/Bowel: --Stomach/Duodenum: No hiatal hernia or other gastric abnormality. Normal duodenal course and caliber. --Small bowel: No dilatation or inflammation. --Colon: No focal abnormality. --Appendix: Not visualized. No right lower quadrant inflammation or free fluid. Vascular/Lymphatic: Atherosclerotic calcification is present within the non-aneurysmal abdominal aorta, without hemodynamically significant stenosis. No abdominal or pelvic lymphadenopathy. Reproductive: Unremarkable Musculoskeletal. Multilevel degenerative disc disease and facet arthrosis. No bony spinal canal stenosis. Other: None. IMPRESSION: 1. Acute fractures of the left seventh, eighth and ninth ribs. No pneumothorax or pulmonary contusion. 2. Multiple old right rib fractures, some which remain displaced and non-united. 3. Coronary artery and Aortic Atherosclerosis (ICD10-I70.0). Electronically Signed   By: Ulyses Jarred M.D.   On: 08/19/2018 02:58   Dg Chest Portable 1 View  Result Date: 08/18/2018 CLINICAL DATA:  Left chest pain for the past 8 days and shortness of breath for the past 10 days. [place] 3 times in the last 2 days. Possible alcohol and drug use today. EXAM: PORTABLE CHEST 1 VIEW COMPARISON:  05/29/2018. FINDINGS: Normal sized heart. Previously demonstrated multiple right rib fractures without interval healing. There is a healing right posterior 5th rib fracture. Mild right basilar atelectasis or scarring. Stable post CABG changes. Moderate right shoulder degenerative changes and fixation screw. Left proximal common carotid artery stent. IMPRESSION: 1. Healing and non healed right  rib fractures.  2. Mild right basilar atelectasis or scarring. Electronically Signed   By: Claudie Revering M.D.   On: 08/18/2018 23:54       Subjective: He has no new complaints today, he relates he does not want to be bothered anymore.  Discharge Exam: Vitals:   08/20/18 2011 08/21/18 0520  BP: (!) 153/90 (!) 161/76  Pulse: 78 77  Resp: 19 18  Temp: 97.6 F (36.4 C) 97.9 F (36.6 C)  SpO2: 97% 99%     General: Pt is alert, awake, not in acute distress Cardiovascular: RRR, S1/S2 +, no rubs, no gallops Respiratory: CTA bilaterally, no wheezing, no rhonchi Abdominal: Soft, NT, ND, bowel sounds + Extremities: no edema, no cyanosis    The results of significant diagnostics from this hospitalization (including imaging, microbiology, ancillary and laboratory) are listed below for reference.     Microbiology: Recent Results (from the past 240 hour(s))  Blood culture (routine x 2)     Status: None (Preliminary result)   Collection Time: 08/19/18 12:27 AM  Result Value Ref Range Status   Specimen Description BLOOD SITE NOT SPECIFIED  Final   Special Requests   Final    BOTTLES DRAWN AEROBIC AND ANAEROBIC Blood Culture adequate volume   Culture   Final    NO GROWTH 1 DAY Performed at [facility], [address], [place], [place] [address]    Report Status PENDING  Incomplete  Blood culture (routine x 2)     Status: None (Preliminary result)   Collection Time: 08/19/18  6:04 AM  Result Value Ref Range Status   Specimen Description BLOOD RIGHT HAND  Final   Special Requests AEROBIC BOTTLE ONLY Blood Culture adequate volume  Final   Culture   Final    NO GROWTH 1 DAY Performed at [facility], [place] [address], [place], [place] [address]    Report Status PENDING  Incomplete     Labs: BNP (last 3 results) Recent Labs    08/19/18 0415  BNP 989.2*   Basic Metabolic Panel: Recent Labs  Lab 08/19/18 0415 08/19/18 1126 08/20/18 0019 08/20/18 1128  08/20/18 2333  NA 126* 122* 126* 134* 131*  K 3.0* 4.1 3.6 4.0 4.4  CL 87* 96* 95* 100 101  CO2 25 15* 22 23 20*  GLUCOSE 98 91 118* 92 113*  BUN 5* 7* 7* 6* 9  CREATININE 0.80 0.74 0.69 0.84 0.75  CALCIUM 7.9* 7.3* 7.4* 8.1* 7.6*  MG 2.0  --   --   --   --   PHOS 3.8  --   --   --   --    Liver Function Tests: Recent Labs  Lab 08/19/18 0027 08/19/18 0415  AST 23 18  ALT 16 16  ALKPHOS 106 101  BILITOT 0.5 0.7  PROT 6.3* 5.9*  ALBUMIN 2.6* 2.5*   Recent Labs  Lab 08/19/18 0027  LIPASE 31   No results for input(s): AMMONIA in the last 168 hours. CBC: Recent Labs  Lab 08/19/18 0027 08/19/18 0415 08/19/18 0833  WBC 15.4* 15.6* 14.8*  NEUTROABS 12.2*  --  11.5*  HGB 15.9 16.0 15.6  HCT 45.5 44.5 44.1  MCV 93.8 93.5 92.6  PLT 267 277 257   Cardiac Enzymes: Recent Labs  Lab 08/19/18 0027 08/19/18 0334  CKTOTAL 52  --   TROPONINI <0.03 0.04*   BNP: Invalid input(s): POCBNP CBG: No results for input(s): GLUCAP in the last 168 hours. D-Dimer No results  for input(s): DDIMER in the last 72 hours. Hgb A1c No results for input(s): HGBA1C in the last 72 hours. Lipid Profile No results for input(s): CHOL, HDL, LDLCALC, TRIG, CHOLHDL, LDLDIRECT in the last 72 hours. Thyroid function studies No results for input(s): TSH, T4TOTAL, T3FREE, THYROIDAB in the last 72 hours.  Invalid input(s): FREET3 Anemia work up No results for input(s): VITAMINB12, FOLATE, FERRITIN, TIBC, IRON, RETICCTPCT in the last 72 hours. Urinalysis    Component Value Date/Time   COLORURINE YELLOW 08/19/2018 0110   APPEARANCEUR CLEAR 08/19/2018 0110   LABSPEC 1.003 (L) 08/19/2018 0110   PHURINE 6.0 08/19/2018 0110   GLUCOSEU NEGATIVE 08/19/2018 0110   HGBUR MODERATE (A) 08/19/2018 0110   BILIRUBINUR NEGATIVE 08/19/2018 0110   KETONESUR NEGATIVE 08/19/2018 0110   PROTEINUR NEGATIVE 08/19/2018 0110   UROBILINOGEN 0.2 06/14/2014 2135   NITRITE NEGATIVE 08/19/2018 0110   LEUKOCYTESUR  MODERATE (A) 08/19/2018 0110   Sepsis Labs Invalid input(s): PROCALCITONIN,  WBC,  LACTICIDVEN Microbiology Recent Results (from the past 240 hour(s))  Blood culture (routine x 2)     Status: None (Preliminary result)   Collection Time: 08/19/18 12:27 AM  Result Value Ref Range Status   Specimen Description BLOOD SITE NOT SPECIFIED  Final   Special Requests   Final    BOTTLES DRAWN AEROBIC AND ANAEROBIC Blood Culture adequate volume   Culture   Final    NO GROWTH 1 DAY Performed at [facility], [place] [address], [place], [place] [address]    Report Status PENDING  Incomplete  Blood culture (routine x 2)     Status: None (Preliminary result)   Collection Time: 08/19/18  6:04 AM  Result Value Ref Range Status   Specimen Description BLOOD RIGHT HAND  Final   Special Requests AEROBIC BOTTLE ONLY Blood Culture adequate volume  Final   Culture   Final    NO GROWTH 1 DAY Performed at [facility], [place] [address], [place], [place] [address]    Report Status PENDING  Incomplete     Time coordinating discharge: 40 minutes  SIGNED:   Charlynne Cousins, MD  [facility]

## 2018-08-24 LAB — CULTURE, BLOOD (ROUTINE X 2)
Culture: NO GROWTH
Culture: NO GROWTH
Special Requests: ADEQUATE
Special Requests: ADEQUATE

## 2018-11-23 ENCOUNTER — Emergency Department (HOSPITAL_COMMUNITY): Payer: Medicare Other

## 2018-11-23 ENCOUNTER — Encounter (HOSPITAL_COMMUNITY): Payer: Self-pay

## 2018-11-23 ENCOUNTER — Other Ambulatory Visit: Payer: Self-pay

## 2018-11-23 ENCOUNTER — Emergency Department (HOSPITAL_COMMUNITY)
Admission: EM | Admit: 2018-11-23 | Discharge: 2018-11-23 | Disposition: A | Discharge status: Home / Self Care | Payer: Medicare Other | Attending: Emergency Medicine | Admitting: Emergency Medicine

## 2018-11-23 DIAGNOSIS — S299XXA Unspecified injury of thorax, initial encounter: Secondary | ICD-10-CM | POA: Diagnosis present

## 2018-11-23 DIAGNOSIS — W541XXA Struck by dog, initial encounter: Secondary | ICD-10-CM | POA: Diagnosis not present

## 2018-11-23 DIAGNOSIS — Y92009 Unspecified place in unspecified non-institutional (private) residence as the place of occurrence of the external cause: Secondary | ICD-10-CM | POA: Diagnosis not present

## 2018-11-23 DIAGNOSIS — S22000A Wedge compression fracture of unspecified thoracic vertebra, initial encounter for closed fracture: Secondary | ICD-10-CM | POA: Diagnosis not present

## 2018-11-23 DIAGNOSIS — Z8673 Personal history of transient ischemic attack (TIA), and cerebral infarction without residual deficits: Secondary | ICD-10-CM | POA: Insufficient documentation

## 2018-11-23 DIAGNOSIS — Z7982 Long term (current) use of aspirin: Secondary | ICD-10-CM | POA: Diagnosis not present

## 2018-11-23 DIAGNOSIS — I2510 Atherosclerotic heart disease of native coronary artery without angina pectoris: Secondary | ICD-10-CM | POA: Diagnosis not present

## 2018-11-23 DIAGNOSIS — F17210 Nicotine dependence, cigarettes, uncomplicated: Secondary | ICD-10-CM | POA: Insufficient documentation

## 2018-11-23 DIAGNOSIS — W19XXXA Unspecified fall, initial encounter: Secondary | ICD-10-CM

## 2018-11-23 DIAGNOSIS — Y999 Unspecified external cause status: Secondary | ICD-10-CM | POA: Diagnosis not present

## 2018-11-23 DIAGNOSIS — Z79899 Other long term (current) drug therapy: Secondary | ICD-10-CM | POA: Diagnosis not present

## 2018-11-23 DIAGNOSIS — R531 Weakness: Secondary | ICD-10-CM | POA: Diagnosis not present

## 2018-11-23 DIAGNOSIS — Z853 Personal history of malignant neoplasm of breast: Secondary | ICD-10-CM | POA: Diagnosis not present

## 2018-11-23 DIAGNOSIS — Y939 Activity, unspecified: Secondary | ICD-10-CM | POA: Diagnosis not present

## 2018-11-23 DIAGNOSIS — Z8546 Personal history of malignant neoplasm of prostate: Secondary | ICD-10-CM | POA: Insufficient documentation

## 2018-11-23 DIAGNOSIS — Z880 Allergy status to penicillin: Secondary | ICD-10-CM | POA: Diagnosis not present

## 2018-11-23 LAB — COMPREHENSIVE METABOLIC PANEL
ALT: 31 U/L (ref 0–44)
AST: 41 U/L (ref 15–41)
Albumin: 3.6 g/dL (ref 3.5–5.0)
Alkaline Phosphatase: 77 U/L (ref 38–126)
Anion gap: 16 — ABNORMAL HIGH (ref 5–15)
BUN: 9 mg/dL (ref 8–23)
CO2: 20 mmol/L — ABNORMAL LOW (ref 22–32)
Calcium: 9 mg/dL (ref 8.9–10.3)
Chloride: 93 mmol/L — ABNORMAL LOW (ref 98–111)
Creatinine, Ser: 0.84 mg/dL (ref 0.61–1.24)
GFR calc Af Amer: >60 mL/min (ref >60)
GFR calc non Af Amer: >60 mL/min (ref >60)
Glucose, Bld: 97 mg/dL (ref 70–99)
Potassium: 3.5 mmol/L (ref 3.5–5.1)
Sodium: 129 mmol/L — ABNORMAL LOW (ref 135–145)
Total Bilirubin: 1.2 mg/dL (ref 0.3–1.2)
Total Protein: 7.2 g/dL (ref 6.5–8.1)

## 2018-11-23 LAB — CBC WITH DIFFERENTIAL/PLATELET
Abs Immature Granulocytes: 0.09 10*3/uL — ABNORMAL HIGH (ref 0.00–0.07)
Basophils Absolute: 0 10*3/uL (ref 0.0–0.1)
Basophils Relative: 0 %
Eosinophils Absolute: 0 10*3/uL (ref 0.0–0.5)
Eosinophils Relative: 0 %
HCT: 42 % (ref 39.0–52.0)
Hemoglobin: 14.7 g/dL (ref 13.0–17.0)
Immature Granulocytes: 1 %
Lymphocytes Relative: 13 %
Lymphs Abs: 1.4 10*3/uL (ref 0.7–4.0)
MCH: 35.9 pg — ABNORMAL HIGH (ref 26.0–34.0)
MCHC: 35 g/dL (ref 30.0–36.0)
MCV: 102.7 fL — ABNORMAL HIGH (ref 80.0–100.0)
Monocytes Absolute: 0.8 10*3/uL (ref 0.1–1.0)
Monocytes Relative: 7 %
Neutro Abs: 8.5 10*3/uL — ABNORMAL HIGH (ref 1.7–7.7)
Neutrophils Relative %: 79 %
Platelets: 214 10*3/uL (ref 150–400)
RBC: 4.09 MIL/uL — ABNORMAL LOW (ref 4.22–5.81)
RDW: 14 % (ref 11.5–15.5)
WBC: 10.8 10*3/uL — ABNORMAL HIGH (ref 4.0–10.5)
nRBC: 0 % (ref 0.0–0.2)

## 2018-11-23 LAB — URINALYSIS, ROUTINE W REFLEX MICROSCOPIC
Bilirubin Urine: NEGATIVE
Glucose, UA: NEGATIVE mg/dL
Ketones, ur: NEGATIVE mg/dL
Nitrite: POSITIVE — AB
Protein, ur: NEGATIVE mg/dL
Specific Gravity, Urine: 1.006 (ref 1.005–1.030)
pH: 6 (ref 5.0–8.0)

## 2018-11-23 LAB — TROPONIN I: Troponin I: <0.03 ng/mL (ref <0.03)

## 2018-11-23 LAB — MAGNESIUM: Magnesium: 1.8 mg/dL (ref 1.7–2.4)

## 2018-11-23 LAB — RAPID URINE DRUG SCREEN, HOSP PERFORMED
Amphetamines: NOT DETECTED
Barbiturates: NOT DETECTED
Benzodiazepines: NOT DETECTED
Cocaine: POSITIVE — AB
Opiates: NOT DETECTED
Tetrahydrocannabinol: NOT DETECTED

## 2018-11-23 LAB — ETHANOL: Alcohol, Ethyl (B): 163 mg/dL — ABNORMAL HIGH (ref <10)

## 2018-11-23 MED ORDER — CIPROFLOXACIN IN D5W 400 MG/200ML IV SOLN
400.0000 mg | Freq: Once | INTRAVENOUS | Status: AC
  Administered 2018-11-23: 400 mg via INTRAVENOUS
  Filled 2018-11-23: qty 200

## 2018-11-23 MED ORDER — OXYCODONE-ACETAMINOPHEN 5-325 MG PO TABS
1.0000 | ORAL_TABLET | Freq: Once | ORAL | Status: AC
  Administered 2018-11-23: 1 via ORAL
  Filled 2018-11-23: qty 1

## 2018-11-23 MED ORDER — LACTATED RINGERS IV BOLUS
1000.0000 mL | Freq: Once | INTRAVENOUS | Status: AC
  Administered 2018-11-23: 05:00:00 1000 mL via INTRAVENOUS

## 2018-11-23 MED ORDER — LORAZEPAM 2 MG/ML IJ SOLN
1.0000 mg | Freq: Once | INTRAMUSCULAR | Status: AC | PRN
  Administered 2018-11-23: 1 mg via INTRAVENOUS
  Filled 2018-11-23: qty 1

## 2018-11-23 MED ORDER — TRAMADOL HCL 50 MG PO TABS: 50.0000 mg | ORAL_TABLET | Freq: Four times a day (QID) | ORAL | 0 refills | Status: DC | PRN

## 2018-11-23 NOTE — ED Notes (Signed)
Pt had some what steady gait. Standby by assistance was needed.

## 2018-11-23 NOTE — ED Notes (Signed)
Patient transported to MRI 

## 2018-11-23 NOTE — ED Triage Notes (Signed)
Pt comes via [facility] from home, called out for a fall, pt reported weakness in bilateral legs and back pain for the past few weeks, pt was orthostatic, in afib with no hx, PTA received 324 ASA and one nitro, pt SOB, initial stat was 70% on RA now on NRB

## 2018-11-23 NOTE — ED Provider Notes (Signed)
Dg Chest 2 View  Result Date: 11/23/2018 CLINICAL DATA:  Fall. EXAM: CHEST - 2 VIEW COMPARISON:  CT of the chest 08/19/2018 FINDINGS: The heart size is normal. Healing right-sided rib fractures are noted. Right lateral airspace opacities are not noted. Left lung is clear. Superior endplate fractures at O1-Y0 are stable. The inferior endplate fracture at T9 is new. IMPRESSION: 1. New inferior endplate fracture at T9. 2. Healing right-sided rib fractures. 3. Peripheral airspace opacity at the right base is improving. Electronically Signed   By: San Morelle M.D.   On: 11/23/2018 05:17   Dg Thoracic Spine W/swimmers  Result Date: 11/23/2018 CLINICAL DATA:  Bilateral leg weakness and back pain over the last few weeks. Fall. EXAM: THORACIC SPINE - 3 VIEWS COMPARISON:  CT of the chest 08/19/2018 FINDINGS: Superior endplate fractures at T7 and T8 are stable. The inferior endplate fracture at T9 is new. Vertebral body heights are otherwise maintained. IMPRESSION: New inferior endplate fracture at T9. Electronically Signed   By: San Morelle M.D.   On: 11/23/2018 05:13   Dg Lumbar Spine Complete  Result Date: 11/23/2018 CLINICAL DATA:  Fall.  Back pain. EXAM: LUMBAR SPINE - COMPLETE 4+ VIEW COMPARISON:  CT of the abdomen and pelvis 08/19/2018 FINDINGS: 5 non rib-bearing lumbar type vertebral bodies are present. Sclerotic degenerative changes are noted at L4-5. Vertebral body heights are maintained. No acute fracture or subluxation is present. Atherosclerotic changes are noted in the aorta. There is focal ectasia just above the bifurcation. IMPRESSION: 1. No acute abnormality. 2. Stable degenerative changes, most evident at L4-5. 3. Focal ectasia of the abdominal aorta. Electronically Signed   By: San Morelle M.D.   On: 11/23/2018 05:16   Ct Head Wo Contrast  Result Date: 11/23/2018 CLINICAL DATA:  Fall.  Bilateral leg weakness. EXAM: CT HEAD WITHOUT CONTRAST TECHNIQUE: Contiguous axial  images were obtained from the base of the skull through the vertex without intravenous contrast. COMPARISON:  CT head without contrast 08/19/2018 FINDINGS: Brain: Remote lacunar infarct involving the right basal ganglia is stable. Remote lacunar infarct in the right thalamus is stable. Moderate atrophy and white matter disease is unchanged. No acute infarct, hemorrhage, or mass lesion is present. The ventricles are proportionate to the degree of atrophy. No significant extraaxial fluid collection is present. Vascular: Atherosclerotic calcifications are present within the cavernous internal carotid arteries. There is no hyperdense vessel. Skull: Calvarium is intact. No focal lytic or blastic lesions are present. No significant extracranial injury is evident. Sinuses/Orbits: Chronic circumferential mucosal thickening is present the left maxillary sinus. There is asymmetric wall thickening of the left maxillary sinus. Fluid is also present in the left maxillary sinus. Ethmoid air cells are clear. A polyp or mucous retention cyst is present laterally in the left sphenoid sinus. Mastoid air cells are clear. Globes and orbits are within normal limits. IMPRESSION: 1. No acute intracranial abnormality or significant interval change. 2. Stable atrophy and white matter disease. 3. Remote lacunar infarcts involving the right basal ganglia are stable. 4. Acute on chronic left maxillary sinus disease. 5. Atherosclerosis Electronically Signed   By: San Morelle M.D.   On: 11/23/2018 05:06   Mr Thoracic Spine Wo Contrast  Result Date: 11/23/2018 CLINICAL DATA:  Recent falls with worsening bilateral lower extremity weakness and sensation changes. Thoracic compression fractures. EXAM: MRI THORACIC AND LUMBAR SPINE WITHOUT CONTRAST TECHNIQUE: Multiplanar and multiecho pulse sequences of the thoracic and lumbar spine were obtained without intravenous contrast. COMPARISON:  Thoracic and  lumbar spine radiographs 11/23/2018.  CT chest, abdomen, and pelvis 08/19/2018. FINDINGS: MRI THORACIC SPINE FINDINGS The study is motion degraded throughout including severe motion on the sagittal STIR and axial spin echo T2 sequences. Alignment: Normal. Vertebrae: T6, T7, and T8 compression fractures all demonstrate progressive vertebral body height loss compared to the 08/19/2018 CT with height loss at each level being between 30 and 40%. Fracture lines remain visible at each level, and there is at least mild marrow edema at each level with assessment limited by motion artifact on the STIR sequence. There is no associated retropulsion. Multiple small Schmorl's nodes are noted. Subtle anterior wedging of the T11 and T12 vertebral bodies is unchanged from the prior CT and chronic in appearance. No suspicious marrow lesion is identified. Cord:  Normal cord signal and morphology. Paraspinal and other soft tissues: Unremarkable. Disc levels: Mild facet arthrosis and up to mild disc bulging in the mid and lower thoracic spine without evidence of significant stenosis. Multilevel disc degeneration and advanced facet arthrosis in the cervical spine are incompletely evaluated. MRI LUMBAR SPINE FINDINGS The study is motion degraded throughout with severe motion on axial sequences. Segmentation: Standard. Alignment: Mild lower lumbar dextroscoliosis with apex at L4. No significant listhesis. Vertebrae: No fracture or suspicious osseous lesion. Severe disc space narrowing asymmetric to the right at L4-5 with T2/STIR hyperintensity in the disc space which is likely degenerative given evidence of vacuum disc and prominent Modic type 3 degenerative endplate marrow changes. Moderate disc space narrowing and milder chronic degenerative endplate changes at E8-3 and L3-4. Disc desiccation throughout the lumbar spine with exception of L1-2. Mild right-sided facet edema from L3-S1. Conus medullaris and cauda equina: Conus extends to the T12 level. Conus and cauda equina  appear normal. Paraspinal and other soft tissues: Unremarkable. Disc levels: L1-2: Normal disc.  Mild facet hypertrophy without stenosis. L2-3: Circumferential disc bulging and mild facet hypertrophy result in mild spinal stenosis and mild-to-moderate left lateral recess stenosis without neural foraminal stenosis. L3-4: Circumferential disc bulging greater to the left, moderate facet and ligamentum flavum hypertrophy, a 2 cm synovial cyst medial to the right facet joint, congenitally short pedicles result in severe spinal stenosis and right greater than left lateral recess stenosis with bilateral neural impingement likely at this level, particularly of the right L4 nerve root. Patent neural foramina. L4-5: Circumferential disc bulging greater to the right, asymmetric right-sided disc space height loss, moderate facet and ligamentum flavum hypertrophy, and congenitally short pedicles result in moderate to severe spinal stenosis, moderate bilateral lateral recess stenosis, and severe right and mild left neural foraminal stenosis. Potential right L4 and bilateral L5 nerve root impingement. L5-S1: Mild disc bulging, a shallow right subarticular disc protrusion with annular fissure, a small right foraminal disc protrusion, and moderate right and mild left facet and ligamentum flavum hypertrophy result in mild right lateral recess and mild right neural foraminal stenosis without spinal stenosis. IMPRESSION: MR THORACIC SPINE IMPRESSION 1. Likely subacute T6, T7, and T8 compression fractures, all demonstrating mildly progressive vertebral body height loss since 08/19/2018 with mild mild associated marrow edema. 2. Mild thoracic spondylosis and facet arthrosis without stenosis. MR LUMBAR SPINE IMPRESSION 1. Multilevel lumbar disc and facet degeneration, most notable at L3-4 where there is severe spinal stenosis with a 2 cm synovial cyst contributing. 2. Moderate to severe spinal stenosis and severe right neural foraminal  stenosis at L4-5. 3. Mild spinal stenosis at L2-3. Electronically Signed   By: Logan Bores M.D.   On:  11/23/2018 11:58   Mr Lumbar Spine Wo Contrast  Result Date: 11/23/2018 CLINICAL DATA:  Recent falls with worsening bilateral lower extremity weakness and sensation changes. Thoracic compression fractures. EXAM: MRI THORACIC AND LUMBAR SPINE WITHOUT CONTRAST TECHNIQUE: Multiplanar and multiecho pulse sequences of the thoracic and lumbar spine were obtained without intravenous contrast. COMPARISON:  Thoracic and lumbar spine radiographs 11/23/2018. CT chest, abdomen, and pelvis 08/19/2018. FINDINGS: MRI THORACIC SPINE FINDINGS The study is motion degraded throughout including severe motion on the sagittal STIR and axial spin echo T2 sequences. Alignment: Normal. Vertebrae: T6, T7, and T8 compression fractures all demonstrate progressive vertebral body height loss compared to the 08/19/2018 CT with height loss at each level being between 30 and 40%. Fracture lines remain visible at each level, and there is at least mild marrow edema at each level with assessment limited by motion artifact on the STIR sequence. There is no associated retropulsion. Multiple small Schmorl's nodes are noted. Subtle anterior wedging of the T11 and T12 vertebral bodies is unchanged from the prior CT and chronic in appearance. No suspicious marrow lesion is identified. Cord:  Normal cord signal and morphology. Paraspinal and other soft tissues: Unremarkable. Disc levels: Mild facet arthrosis and up to mild disc bulging in the mid and lower thoracic spine without evidence of significant stenosis. Multilevel disc degeneration and advanced facet arthrosis in the cervical spine are incompletely evaluated. MRI LUMBAR SPINE FINDINGS The study is motion degraded throughout with severe motion on axial sequences. Segmentation: Standard. Alignment: Mild lower lumbar dextroscoliosis with apex at L4. No significant listhesis. Vertebrae: No fracture  or suspicious osseous lesion. Severe disc space narrowing asymmetric to the right at L4-5 with T2/STIR hyperintensity in the disc space which is likely degenerative given evidence of vacuum disc and prominent Modic type 3 degenerative endplate marrow changes. Moderate disc space narrowing and milder chronic degenerative endplate changes at W9-6 and L3-4. Disc desiccation throughout the lumbar spine with exception of L1-2. Mild right-sided facet edema from L3-S1. Conus medullaris and cauda equina: Conus extends to the T12 level. Conus and cauda equina appear normal. Paraspinal and other soft tissues: Unremarkable. Disc levels: L1-2: Normal disc.  Mild facet hypertrophy without stenosis. L2-3: Circumferential disc bulging and mild facet hypertrophy result in mild spinal stenosis and mild-to-moderate left lateral recess stenosis without neural foraminal stenosis. L3-4: Circumferential disc bulging greater to the left, moderate facet and ligamentum flavum hypertrophy, a 2 cm synovial cyst medial to the right facet joint, congenitally short pedicles result in severe spinal stenosis and right greater than left lateral recess stenosis with bilateral neural impingement likely at this level, particularly of the right L4 nerve root. Patent neural foramina. L4-5: Circumferential disc bulging greater to the right, asymmetric right-sided disc space height loss, moderate facet and ligamentum flavum hypertrophy, and congenitally short pedicles result in moderate to severe spinal stenosis, moderate bilateral lateral recess stenosis, and severe right and mild left neural foraminal stenosis. Potential right L4 and bilateral L5 nerve root impingement. L5-S1: Mild disc bulging, a shallow right subarticular disc protrusion with annular fissure, a small right foraminal disc protrusion, and moderate right and mild left facet and ligamentum flavum hypertrophy result in mild right lateral recess and mild right neural foraminal stenosis  without spinal stenosis. IMPRESSION: MR THORACIC SPINE IMPRESSION 1. Likely subacute T6, T7, and T8 compression fractures, all demonstrating mildly progressive vertebral body height loss since 08/19/2018 with mild mild associated marrow edema. 2. Mild thoracic spondylosis and facet arthrosis without stenosis. MR LUMBAR  SPINE IMPRESSION 1. Multilevel lumbar disc and facet degeneration, most notable at L3-4 where there is severe spinal stenosis with a 2 cm synovial cyst contributing. 2. Moderate to severe spinal stenosis and severe right neural foraminal stenosis at L4-5. 3. Mild spinal stenosis at L2-3. Electronically Signed   By: Logan Bores M.D.   On: 11/23/2018 11:58     Subacute T6, T7, T8 compression fractures without significant retropulsion.  Patient does have severe lumbar spinal stenosis.  He is able to ambulate here in the emergency department.  He uses a walker at home.  He is able to well up with his walker without difficulty here in the emergency department.  He states that over the past several days he has had difficulty getting around secondary to increasing pain in his back this is likely coming from the thoracic compression fractures.  Outpatient neurosurgical follow-up for compression fractures but more importantly for his severe lumbar spinal stenosis which may need to be addressed sooner than later.  I do not think he needs acute surgical decompression at this time.  He is stable for follow-up.  Primary care to be involved.  Prescription for tramadol written.  Patient prefers to be discharged home at this time.  He is requesting taxi voucher for discharge.  He is encouraged to return the emergency department for new or worsening symptoms    Jola Schmidt, MD 11/23/18 [id number]

## 2018-11-23 NOTE — ED Notes (Addendum)
Pt ambulated fairly with walker; stand by assist needed. Pt unable to ambulate on his own without assistive device.

## 2018-11-23 NOTE — ED Notes (Signed)
Patient verbalizes understanding of discharge instructions. Opportunity for questioning and answers were provided. Armband removed by staff, pt discharged from ED.  

## 2018-11-23 NOTE — ED Notes (Signed)
Spoke to pt's girlfriend Dawn with with pt update.

## 2018-11-23 NOTE — Discharge Instructions (Addendum)
Call the spine team for follow-up  Take the pain medication as prescribed  Please call your primary care physician for close follow-up

## 2018-11-23 NOTE — ED Notes (Signed)
Pt taken off NRB, pt stat 100 on RA, attempted IV start x 2

## 2018-11-23 NOTE — ED Notes (Signed)
Patient transported to X-ray 

## 2018-11-23 NOTE — ED Provider Notes (Signed)
[facility] EMERGENCY DEPARTMENT Provider Note   CSN: [id number] Arrival date & time: 11/23/18  4403    History   Chief Complaint Chief Complaint  Patient presents with   Shortness of Breath   Fall    HPI [name] Pace is a 68 y.o. male.     Was hit by a dog and fell twice a couple days ago. Has had progressively worsening BLE weakness and sensation changes since that time.    Fall Associated symptoms include shortness of breath.  Weakness Severity:  Moderate Onset quality:  Gradual Duration:  2 days Timing:  Constant Progression:  Worsening Chronicity:  New Context: not increased activity   Relieved by:  Nothing Worsened by:  Nothing Ineffective treatments:  None tried Associated symptoms: difficulty walking, falls and shortness of breath     Past Medical History:  Diagnosis Date   Alcohol abuse    Anxiety    Breast cancer ([place]) Left breast   Per patient diagnosed in early 61's.    Cocaine abuse ([place]) 08/19/2018   Coronary artery disease    High cholesterol    Left rib fracture 08/19/2018   Prostate cancer ([place])    per patient diagnosed in early 50's   Stroke [facility])     Patient Active Problem List   Diagnosis Date Noted   Cocaine abuse ([place]) 08/19/2018   Polysubstance abuse ([place]) 08/19/2018   Left rib fracture 08/19/2018   Potomania 08/19/2018   Alcohol abuse with intoxication ([place]) 08/19/2018   At risk for adverse drug event 06/12/2018   Palliative care encounter    Encephalopathy acute    Delirium tremens ([place]) 05/18/2018   Malnutrition of moderate degree 05/18/2018   Acute respiratory failure ([place])    Shock circulatory ([place])    Multiple rib fractures 05/14/2018   Pneumothorax, closed, traumatic, initial encounter 05/14/2018   Traumatic fracture of ribs with pneumothorax    Laceration of muscle(s) and tendon(s) of anterior muscle group at lower leg level, right leg, initial encounter 02/10/2016    Traumatic rupture of right anterior tibial tendon 02/10/2016   Hip fracture, left ([place]) 07/02/2014   Closed left hip fracture ([place]) 07/02/2014   Hip fracture ([place]) 07/02/2014   Intertrochanteric fracture of left femur ([place]) 07/02/2014   History of stroke    Preop examination    Subclavian artery stenosis, left ([place]) 06/15/2014   Chest pain 06/14/2014   CAP (community acquired pneumonia) 06/14/2014   Sepsis ([place]) 06/14/2014   Alcohol abuse 06/14/2014   CAD (coronary artery disease) 06/14/2014   Tobacco abuse 06/14/2014    Past Surgical History:  Procedure Laterality Date   CAROTID STENT     CORONARY ARTERY BYPASS GRAFT     FEMUR IM NAIL Left 07/02/2014   Procedure: INTRAMEDULLARY (IM) NAIL FEMORAL;  Surgeon: Elie Goody, MD;  Location: [facility];  Service: Orthopedics;  Laterality: Left;   I&D EXTREMITY Right 02/10/2016   Procedure: IRRIGATION AND DEBRIDEMENT EXTREMITY;  Surgeon: Dorna Leitz, MD;  Location: [place];  Service: Orthopedics;  Laterality: Right;   TENDON REPAIR Right 02/10/2016   Procedure: Anterior TENDON REPAIR and wound exploration.;  Surgeon: Dorna Leitz, MD;  Location: [place];  Service: Orthopedics;  Laterality: Right;        Home Medications    Prior to Admission medications   Medication Sig Start Date End Date Taking? Authorizing Provider  aspirin EC 81 MG tablet Take 81 mg by mouth daily.   Yes [provider]  folic acid (FOLVITE) 1 MG tablet Take 1 tablet (1 mg total) by mouth daily. Patient not taking: Reported on 08/20/2018 06/09/18   Debbe Odea, MD  lidocaine (LIDODERM) 5 % Place 2 patches onto the skin daily. Remove & Discard patch within 12 hours or as directed by MD Patient not taking: Reported on 08/20/2018 06/09/18   Debbe Odea, MD  LORazepam (ATIVAN) 0.5 MG tablet Take 1 tablet (0.5 mg total) by mouth every 8 (eight) hours as needed for anxiety. Patient not taking: Reported on 08/20/2018 06/09/18   Debbe Odea, MD    OLANZapine (ZYPREXA) 2.5 MG tablet Take 2 tablets (5 mg total) by mouth at bedtime. Patient not taking: Reported on 08/20/2018 06/09/18   Debbe Odea, MD  OLANZapine (ZYPREXA) 5 MG tablet Take 0.5 tablets (2.5 mg total) by mouth daily as needed (agitation). Patient not taking: Reported on 08/20/2018 06/09/18   Debbe Odea, MD  polyethylene glycol (MIRALAX / GLYCOLAX) packet Take 17 g by mouth daily as needed for moderate constipation or severe constipation. Patient not taking: Reported on 08/20/2018 06/09/18   Debbe Odea, MD  thiamine 100 MG tablet Take 1 tablet (100 mg total) by mouth daily. Patient not taking: Reported on 08/20/2018 06/09/18   Debbe Odea, MD  vitamin B-12 1000 MCG tablet Take 1 tablet (1,000 mcg total) by mouth daily. Patient not taking: Reported on 08/20/2018 06/09/18   Debbe Odea, MD    Family History Family History  Problem Relation Age of Onset   Dementia Mother     Social History Social History   Tobacco Use   Smoking status: Current Every Day Smoker    Packs/day: 1.00    Years: 42.00    Pack years: 42.00    Types: Cigarettes   Smokeless tobacco: Never Used  Substance Use Topics   Alcohol use: Yes    Comment: 4-5 beers a day   Drug use: No     Allergies   Penicillins   Review of Systems Review of Systems  Respiratory: Positive for shortness of breath.   Musculoskeletal: Positive for falls.  Neurological: Positive for weakness.     Physical Exam Updated Vital Signs BP 138/84    Pulse 85    Temp 98.3 F (36.8 C) (Oral)    Resp (!) 24    SpO2 100%   Physical Exam Vitals signs and nursing note reviewed.  Constitutional:      Appearance: He is well-developed.  HENT:     Head: Normocephalic and atraumatic.     Nose: No congestion or rhinorrhea.     Mouth/Throat:     Mouth: Mucous membranes are moist.  Eyes:     Extraocular Movements: Extraocular movements intact.     Conjunctiva/sclera: Conjunctivae normal.  Neck:      Musculoskeletal: Normal range of motion.  Cardiovascular:     Rate and Rhythm: Normal rate.  Pulmonary:     Effort: Pulmonary effort is normal. No respiratory distress.  Abdominal:     General: There is no distension.  Musculoskeletal: Normal range of motion.     Comments: Bilateral paraspinal ttp in lumbar and thoracic area  Neurological:     Mental Status: He is alert and oriented to person, place, and time.     GCS: GCS eye subscore is 4. GCS verbal subscore is 5. GCS motor subscore is 6.     Cranial Nerves: Cranial nerves are intact.     Sensory: Sensory deficit (diminished in BLE) present.  Motor: Weakness (in BLE (can't raise legs off bed, L worse than R)) present.      ED Treatments / Results  Labs (all labs ordered are listed, but only abnormal results are displayed) Labs Reviewed  COMPREHENSIVE METABOLIC PANEL - Abnormal; Notable for the following components:      Result Value   Sodium 129 (*)    Chloride 93 (*)    CO2 20 (*)    Anion gap 16 (*)    All other components within normal limits  URINALYSIS, ROUTINE W REFLEX MICROSCOPIC - Abnormal; Notable for the following components:   Hgb urine dipstick SMALL (*)    Nitrite POSITIVE (*)    Leukocytes,Ua MODERATE (*)    Bacteria, UA MANY (*)    All other components within normal limits  ETHANOL - Abnormal; Notable for the following components:   Alcohol, Ethyl (B) 163 (*)    All other components within normal limits  RAPID URINE DRUG SCREEN, HOSP PERFORMED - Abnormal; Notable for the following components:   Cocaine POSITIVE (*)    All other components within normal limits  CBC WITH DIFFERENTIAL/PLATELET - Abnormal; Notable for the following components:   WBC 10.8 (*)    RBC 4.09 (*)    MCV 102.7 (*)    MCH 35.9 (*)    Neutro Abs 8.5 (*)    Abs Immature Granulocytes 0.09 (*)    All other components within normal limits  URINE CULTURE  TROPONIN I  MAGNESIUM  CBC WITH DIFFERENTIAL/PLATELET    EKG EKG  Interpretation  Date/Time:  Friday November 23 2018 04:02:42 EDT Ventricular Rate:  90 PR Interval:    QRS Duration: 102 QT Interval:  376 QTC Calculation: 461 R Axis:   72 Text Interpretation:  Sinus rhythm Probable left atrial enlargement LVH with secondary repolarization abnormality Anterior Q waves, possibly due to LVH ST depression, consider ischemia, diffuse lds very similar to 08/2018 Confirmed by Merrily Pew [phone]) on 11/23/2018 4:09:02 AM   Radiology Dg Chest 2 View  Result Date: 11/23/2018 CLINICAL DATA:  Fall. EXAM: CHEST - 2 VIEW COMPARISON:  CT of the chest 08/19/2018 FINDINGS: The heart size is normal. Healing right-sided rib fractures are noted. Right lateral airspace opacities are not noted. Left lung is clear. Superior endplate fractures at B0-F7 are stable. The inferior endplate fracture at T9 is new. IMPRESSION: 1. New inferior endplate fracture at T9. 2. Healing right-sided rib fractures. 3. Peripheral airspace opacity at the right base is improving. Electronically Signed   By: San Morelle M.D.   On: 11/23/2018 05:17   Dg Thoracic Spine W/swimmers  Result Date: 11/23/2018 CLINICAL DATA:  Bilateral leg weakness and back pain over the last few weeks. Fall. EXAM: THORACIC SPINE - 3 VIEWS COMPARISON:  CT of the chest 08/19/2018 FINDINGS: Superior endplate fractures at T7 and T8 are stable. The inferior endplate fracture at T9 is new. Vertebral body heights are otherwise maintained. IMPRESSION: New inferior endplate fracture at T9. Electronically Signed   By: San Morelle M.D.   On: 11/23/2018 05:13   Dg Lumbar Spine Complete  Result Date: 11/23/2018 CLINICAL DATA:  Fall.  Back pain. EXAM: LUMBAR SPINE - COMPLETE 4+ VIEW COMPARISON:  CT of the abdomen and pelvis 08/19/2018 FINDINGS: 5 non rib-bearing lumbar type vertebral bodies are present. Sclerotic degenerative changes are noted at L4-5. Vertebral body heights are maintained. No acute fracture or subluxation is  present. Atherosclerotic changes are noted in the aorta. There is focal ectasia just  above the bifurcation. IMPRESSION: 1. No acute abnormality. 2. Stable degenerative changes, most evident at L4-5. 3. Focal ectasia of the abdominal aorta. Electronically Signed   By: San Morelle M.D.   On: 11/23/2018 05:16   Ct Head Wo Contrast  Result Date: 11/23/2018 CLINICAL DATA:  Fall.  Bilateral leg weakness. EXAM: CT HEAD WITHOUT CONTRAST TECHNIQUE: Contiguous axial images were obtained from the base of the skull through the vertex without intravenous contrast. COMPARISON:  CT head without contrast 08/19/2018 FINDINGS: Brain: Remote lacunar infarct involving the right basal ganglia is stable. Remote lacunar infarct in the right thalamus is stable. Moderate atrophy and white matter disease is unchanged. No acute infarct, hemorrhage, or mass lesion is present. The ventricles are proportionate to the degree of atrophy. No significant extraaxial fluid collection is present. Vascular: Atherosclerotic calcifications are present within the cavernous internal carotid arteries. There is no hyperdense vessel. Skull: Calvarium is intact. No focal lytic or blastic lesions are present. No significant extracranial injury is evident. Sinuses/Orbits: Chronic circumferential mucosal thickening is present the left maxillary sinus. There is asymmetric wall thickening of the left maxillary sinus. Fluid is also present in the left maxillary sinus. Ethmoid air cells are clear. A polyp or mucous retention cyst is present laterally in the left sphenoid sinus. Mastoid air cells are clear. Globes and orbits are within normal limits. IMPRESSION: 1. No acute intracranial abnormality or significant interval change. 2. Stable atrophy and white matter disease. 3. Remote lacunar infarcts involving the right basal ganglia are stable. 4. Acute on chronic left maxillary sinus disease. 5. Atherosclerosis Electronically Signed   By: San Morelle M.D.   On: 11/23/2018 05:06    Procedures Procedures (including critical care time)  Medications Ordered in ED Medications  LORazepam (ATIVAN) injection 1 mg (has no administration in time range)  ciprofloxacin (CIPRO) IVPB 400 mg (has no administration in time range)  lactated ringers bolus 1,000 mL (1,000 mLs Intravenous New Bag/Given 11/23/18 0509)     Initial Impression / Assessment and Plan / ED Course  I have reviewed the triage vital signs and the nursing notes.  Pertinent labs & imaging results that were available during my care of the patient were reviewed by me and considered in my medical decision making (see chart for details).   Patient with BLE weakness after a fall and back pain. xr with evidence of endplate fracture but doesn't explain symptoms. Has persistent weakness. Still pending UA. Will MRI to ensure no cauda equina.  UTI, started cipro as he has anaphylactic PCN allergy.  Care transferred pending MRI.  Final Clinical Impressions(s) / ED Diagnoses   Final diagnoses:  Fall    ED Discharge Orders    None       Lalana Wachter, Corene Cornea, MD 11/23/18 [phone]

## 2018-11-23 NOTE — ED Notes (Signed)
Upon assessment, pt is covered in urine and stool. IV is out and sitting in bed. Pt disoriented. Pt cleaned and condom catheter placed.

## 2018-11-23 NOTE — ED Notes (Signed)
Nurse Navigator communication: The pts GF spoke with the primary nurse for update. Pt should be coming up for discharge soon. We are attempting to get Case management involved.

## 2018-11-25 LAB — URINE CULTURE: Culture: >=100000 — AB

## 2018-11-26 ENCOUNTER — Telehealth: Payer: Self-pay | Admitting: Emergency Medicine

## 2018-11-26 NOTE — Progress Notes (Signed)
ED Antimicrobial Stewardship Positive Culture Follow Up   [name] Pace is an 68 y.o. male who presented to [facility] on 11/23/2018 with a chief complaint of  Chief Complaint  Patient presents with  . Shortness of Breath  . Fall    Recent Results (from the past 720 hour(s))  Urine culture     Status: Abnormal   Collection Time: 11/23/18  6:30 AM   Specimen: Urine, Random  Result Value Ref Range Status   Specimen Description URINE, RANDOM  Final   Special Requests   Final    NONE Performed at [facility], [address], [place], [place] [address]    Culture >=100,000 COLONIES/mL KLEBSIELLA PNEUMONIAE (A)  Final   Report Status 11/25/2018 FINAL  Final   Organism ID, Bacteria KLEBSIELLA PNEUMONIAE (A)  Final      Susceptibility   Klebsiella pneumoniae - MIC*    AMPICILLIN >=32 RESISTANT Resistant     CEFAZOLIN <=4 SENSITIVE Sensitive     CEFTRIAXONE <=1 SENSITIVE Sensitive     CIPROFLOXACIN <=0.25 SENSITIVE Sensitive     GENTAMICIN <=1 SENSITIVE Sensitive     IMIPENEM <=0.25 SENSITIVE Sensitive     NITROFURANTOIN 64 INTERMEDIATE Intermediate     TRIMETH/SULFA <=20 SENSITIVE Sensitive     AMPICILLIN/SULBACTAM >=32 RESISTANT Resistant     PIP/TAZO <=4 SENSITIVE Sensitive     Extended ESBL NEGATIVE Sensitive     * >=100,000 COLONIES/mL KLEBSIELLA PNEUMONIAE    []  Treated with N/A, organism resistant to prescribed antimicrobial [x]  Patient discharged originally without antimicrobial agent and treatment is now indicated  New antibiotic prescription: If pt with UTI symptoms, start cephalexin 500mg  PO BID x 7 days (*noted anaphylactic reaction to penicillins but pt has received ceftriaxone without any issue in the past)  ED Provider: Suella Broad, PA    [name], [name] 11/26/2018, 8:12 AM Clinical Pharmacist Monday - Friday phone -  [phone] Saturday - Sunday phone - [phone]

## 2018-11-26 NOTE — Telephone Encounter (Signed)
Post ED Visit - Positive Culture Follow-up: Successful Patient Follow-Up  Culture assessed and recommendations reviewed by:  []  Elenor Quinones, Pharm.D. []  Heide Guile, Pharm.D., BCPS AQ-ID []  Parks Neptune, Pharm.D., BCPS []  Alycia Rossetti, Pharm.D., BCPS []  [place], Pharm.D., BCPS, AAHIVP []  Legrand Como, Pharm.D., BCPS, AAHIVP [x]  Salome Arnt, PharmD, BCPS []  Johnnette Gourd, PharmD, BCPS []  Hughes Better, PharmD, BCPS []  Leeroy Cha, PharmD  Positive urine culture  []  Patient discharged without antimicrobial prescription and treatment is now indicated []  Organism is resistant to prescribed ED discharge antimicrobial []  Patient with positive blood cultures  Changes discussed with ED provider: Suella Broad PA New antibiotic prescription symptom check, if UTI symptoms, Keflex 500mg  po bid x 7 days  Attempting to contact patient    Hazle Nordmann 11/26/2018, 11:44 AM

## 2018-12-01 ENCOUNTER — Encounter (HOSPITAL_COMMUNITY): Payer: Self-pay | Admitting: Emergency Medicine

## 2018-12-01 ENCOUNTER — Observation Stay (HOSPITAL_COMMUNITY): Payer: Medicare Other

## 2018-12-01 ENCOUNTER — Observation Stay (HOSPITAL_COMMUNITY)
Admission: EM | Admit: 2018-12-01 | Discharge: 2018-12-02 | Disposition: A | Discharge status: Home / Self Care | Payer: Medicare Other | Attending: Internal Medicine | Admitting: Internal Medicine

## 2018-12-01 ENCOUNTER — Emergency Department (HOSPITAL_COMMUNITY): Payer: Medicare Other

## 2018-12-01 ENCOUNTER — Other Ambulatory Visit: Payer: Self-pay

## 2018-12-01 DIAGNOSIS — Z8673 Personal history of transient ischemic attack (TIA), and cerebral infarction without residual deficits: Secondary | ICD-10-CM

## 2018-12-01 DIAGNOSIS — R931 Abnormal findings on diagnostic imaging of heart and coronary circulation: Secondary | ICD-10-CM | POA: Diagnosis not present

## 2018-12-01 DIAGNOSIS — S22050A Wedge compression fracture of T5-T6 vertebra, initial encounter for closed fracture: Secondary | ICD-10-CM

## 2018-12-01 DIAGNOSIS — S22058D Other fracture of T5-T6 vertebra, subsequent encounter for fracture with routine healing: Secondary | ICD-10-CM | POA: Diagnosis not present

## 2018-12-01 DIAGNOSIS — Z880 Allergy status to penicillin: Secondary | ICD-10-CM | POA: Diagnosis not present

## 2018-12-01 DIAGNOSIS — Z1159 Encounter for screening for other viral diseases: Secondary | ICD-10-CM | POA: Insufficient documentation

## 2018-12-01 DIAGNOSIS — F1410 Cocaine abuse, uncomplicated: Secondary | ICD-10-CM | POA: Diagnosis not present

## 2018-12-01 DIAGNOSIS — R079 Chest pain, unspecified: Secondary | ICD-10-CM | POA: Diagnosis present

## 2018-12-01 DIAGNOSIS — S22069D Unspecified fracture of T7-T8 vertebra, subsequent encounter for fracture with routine healing: Secondary | ICD-10-CM

## 2018-12-01 DIAGNOSIS — M48061 Spinal stenosis, lumbar region without neurogenic claudication: Secondary | ICD-10-CM | POA: Insufficient documentation

## 2018-12-01 DIAGNOSIS — M4854XA Collapsed vertebra, not elsewhere classified, thoracic region, initial encounter for fracture: Secondary | ICD-10-CM | POA: Insufficient documentation

## 2018-12-01 DIAGNOSIS — Z9181 History of falling: Secondary | ICD-10-CM | POA: Diagnosis not present

## 2018-12-01 DIAGNOSIS — J441 Chronic obstructive pulmonary disease with (acute) exacerbation: Principal | ICD-10-CM | POA: Diagnosis present

## 2018-12-01 DIAGNOSIS — Z8546 Personal history of malignant neoplasm of prostate: Secondary | ICD-10-CM | POA: Insufficient documentation

## 2018-12-01 DIAGNOSIS — R0789 Other chest pain: Secondary | ICD-10-CM | POA: Diagnosis not present

## 2018-12-01 DIAGNOSIS — F17210 Nicotine dependence, cigarettes, uncomplicated: Secondary | ICD-10-CM | POA: Insufficient documentation

## 2018-12-01 DIAGNOSIS — Z951 Presence of aortocoronary bypass graft: Secondary | ICD-10-CM | POA: Insufficient documentation

## 2018-12-01 DIAGNOSIS — Z7951 Long term (current) use of inhaled steroids: Secondary | ICD-10-CM | POA: Diagnosis not present

## 2018-12-01 DIAGNOSIS — E7800 Pure hypercholesterolemia, unspecified: Secondary | ICD-10-CM | POA: Insufficient documentation

## 2018-12-01 DIAGNOSIS — E876 Hypokalemia: Secondary | ICD-10-CM | POA: Diagnosis not present

## 2018-12-01 DIAGNOSIS — F419 Anxiety disorder, unspecified: Secondary | ICD-10-CM | POA: Insufficient documentation

## 2018-12-01 DIAGNOSIS — S22059A Unspecified fracture of T5-T6 vertebra, initial encounter for closed fracture: Secondary | ICD-10-CM | POA: Diagnosis present

## 2018-12-01 DIAGNOSIS — M5136 Other intervertebral disc degeneration, lumbar region: Secondary | ICD-10-CM | POA: Diagnosis not present

## 2018-12-01 DIAGNOSIS — I2510 Atherosclerotic heart disease of native coronary artery without angina pectoris: Secondary | ICD-10-CM | POA: Diagnosis present

## 2018-12-01 DIAGNOSIS — Z79899 Other long term (current) drug therapy: Secondary | ICD-10-CM | POA: Insufficient documentation

## 2018-12-01 DIAGNOSIS — F1010 Alcohol abuse, uncomplicated: Secondary | ICD-10-CM | POA: Diagnosis present

## 2018-12-01 DIAGNOSIS — Z7982 Long term (current) use of aspirin: Secondary | ICD-10-CM | POA: Diagnosis not present

## 2018-12-01 DIAGNOSIS — R262 Difficulty in walking, not elsewhere classified: Secondary | ICD-10-CM | POA: Insufficient documentation

## 2018-12-01 DIAGNOSIS — S22060A Wedge compression fracture of T7-T8 vertebra, initial encounter for closed fracture: Secondary | ICD-10-CM | POA: Diagnosis present

## 2018-12-01 DIAGNOSIS — S22069A Unspecified fracture of T7-T8 vertebra, initial encounter for closed fracture: Secondary | ICD-10-CM | POA: Diagnosis present

## 2018-12-01 DIAGNOSIS — S22068D Other fracture of T7-T8 thoracic vertebra, subsequent encounter for fracture with routine healing: Secondary | ICD-10-CM

## 2018-12-01 LAB — BASIC METABOLIC PANEL
Anion gap: 14 (ref 5–15)
BUN: <5 mg/dL — ABNORMAL LOW (ref 8–23)
CO2: 22 mmol/L (ref 22–32)
Calcium: 8.3 mg/dL — ABNORMAL LOW (ref 8.9–10.3)
Chloride: 98 mmol/L (ref 98–111)
Creatinine, Ser: 0.57 mg/dL — ABNORMAL LOW (ref 0.61–1.24)
GFR calc Af Amer: >60 mL/min (ref >60)
GFR calc non Af Amer: >60 mL/min (ref >60)
Glucose, Bld: 88 mg/dL (ref 70–99)
Potassium: 3.5 mmol/L (ref 3.5–5.1)
Sodium: 134 mmol/L — ABNORMAL LOW (ref 135–145)

## 2018-12-01 LAB — SARS CORONAVIRUS 2 BY RT PCR (HOSPITAL ORDER, PERFORMED IN ~~LOC~~ HOSPITAL LAB): SARS Coronavirus 2: NEGATIVE

## 2018-12-01 LAB — BRAIN NATRIURETIC PEPTIDE: B Natriuretic Peptide: 286.1 pg/mL — ABNORMAL HIGH (ref 0.0–100.0)

## 2018-12-01 LAB — TROPONIN I (HIGH SENSITIVITY)
Troponin I (High Sensitivity): 9 ng/L (ref <18)
Troponin I (High Sensitivity): 9 ng/L (ref <18)
Troponin I (High Sensitivity): 6 ng/L (ref <18)

## 2018-12-01 LAB — CBC WITH DIFFERENTIAL/PLATELET
Abs Immature Granulocytes: 0.03 10*3/uL (ref 0.00–0.07)
Basophils Absolute: 0.1 10*3/uL (ref 0.0–0.1)
Basophils Relative: 1 %
Eosinophils Absolute: 0.1 10*3/uL (ref 0.0–0.5)
Eosinophils Relative: 1 %
HCT: 42.5 % (ref 39.0–52.0)
Hemoglobin: 14.4 g/dL (ref 13.0–17.0)
Immature Granulocytes: 0 %
Lymphocytes Relative: 28 %
Lymphs Abs: 2.2 10*3/uL (ref 0.7–4.0)
MCH: 36 pg — ABNORMAL HIGH (ref 26.0–34.0)
MCHC: 33.9 g/dL (ref 30.0–36.0)
MCV: 106.3 fL — ABNORMAL HIGH (ref 80.0–100.0)
Monocytes Absolute: 0.7 10*3/uL (ref 0.1–1.0)
Monocytes Relative: 9 %
Neutro Abs: 4.7 10*3/uL (ref 1.7–7.7)
Neutrophils Relative %: 61 %
Platelets: 335 10*3/uL (ref 150–400)
RBC: 4 MIL/uL — ABNORMAL LOW (ref 4.22–5.81)
RDW: 13 % (ref 11.5–15.5)
WBC: 7.7 10*3/uL (ref 4.0–10.5)
nRBC: 0 % (ref 0.0–0.2)

## 2018-12-01 LAB — LIPID PANEL
Cholesterol: 233 mg/dL — ABNORMAL HIGH (ref 0–200)
HDL: 98 mg/dL (ref >40)
LDL Cholesterol: 122 mg/dL — ABNORMAL HIGH (ref 0–99)
Total CHOL/HDL Ratio: 2.4 RATIO
Triglycerides: 67 mg/dL (ref <150)
VLDL: 13 mg/dL (ref 0–40)

## 2018-12-01 LAB — HEMOGLOBIN A1C
Hgb A1c MFr Bld: 4.5 % — ABNORMAL LOW (ref 4.8–5.6)
Mean Plasma Glucose: 82.45 mg/dL

## 2018-12-01 LAB — TSH: TSH: 0.343 u[IU]/mL — ABNORMAL LOW (ref 0.350–4.500)

## 2018-12-01 LAB — ETHANOL: Alcohol, Ethyl (B): 13 mg/dL — ABNORMAL HIGH (ref <10)

## 2018-12-01 LAB — MAGNESIUM: Magnesium: 2.1 mg/dL (ref 1.7–2.4)

## 2018-12-01 LAB — D-DIMER, QUANTITATIVE: D-Dimer, Quant: 0.78 ug/mL-FEU — ABNORMAL HIGH (ref 0.00–0.50)

## 2018-12-01 MED ORDER — AZITHROMYCIN 250 MG PO TABS
500.0000 mg | ORAL_TABLET | Freq: Every day | ORAL | Status: DC
  Administered 2018-12-02: 500 mg via ORAL
  Filled 2018-12-01: qty 2

## 2018-12-01 MED ORDER — IPRATROPIUM BROMIDE HFA 17 MCG/ACT IN AERS
4.0000 | INHALATION_SPRAY | Freq: Once | RESPIRATORY_TRACT | Status: DC
  Filled 2018-12-01: qty 12.9

## 2018-12-01 MED ORDER — ADULT MULTIVITAMIN W/MINERALS CH
1.0000 | ORAL_TABLET | Freq: Every day | ORAL | Status: DC
  Administered 2018-12-01 – 2018-12-02 (×2): 1 via ORAL
  Filled 2018-12-01 (×2): qty 1

## 2018-12-01 MED ORDER — THIAMINE HCL 100 MG/ML IJ SOLN: 100.0000 mg | Freq: Every day | INTRAMUSCULAR | Status: DC

## 2018-12-01 MED ORDER — ENOXAPARIN SODIUM 40 MG/0.4ML ~~LOC~~ SOLN
40.0000 mg | SUBCUTANEOUS | Status: DC
  Administered 2018-12-01: 40 mg via SUBCUTANEOUS
  Filled 2018-12-01: qty 0.4

## 2018-12-01 MED ORDER — LORAZEPAM 0.5 MG PO TABS
0.5000 mg | ORAL_TABLET | Freq: Three times a day (TID) | ORAL | Status: DC | PRN
  Administered 2018-12-01: 0.5 mg via ORAL
  Filled 2018-12-01 (×2): qty 1

## 2018-12-01 MED ORDER — ALBUTEROL SULFATE (2.5 MG/3ML) 0.083% IN NEBU: 5.0000 mg | INHALATION_SOLUTION | Freq: Once | RESPIRATORY_TRACT | Status: DC

## 2018-12-01 MED ORDER — FOLIC ACID 1 MG PO TABS
1.0000 mg | ORAL_TABLET | Freq: Every day | ORAL | Status: DC
  Filled 2018-12-01: qty 1

## 2018-12-01 MED ORDER — GUAIFENESIN 100 MG/5ML PO SOLN
5.0000 mL | ORAL | Status: DC | PRN
  Administered 2018-12-02 (×2): 100 mg via ORAL
  Filled 2018-12-01 (×2): qty 10

## 2018-12-01 MED ORDER — LACTULOSE 10 GM/15ML PO SOLN
20.0000 g | Freq: Once | ORAL | Status: AC
  Administered 2018-12-01: 20 g
  Filled 2018-12-01: qty 30

## 2018-12-01 MED ORDER — IPRATROPIUM-ALBUTEROL 0.5-2.5 (3) MG/3ML IN SOLN
3.0000 mL | Freq: Three times a day (TID) | RESPIRATORY_TRACT | Status: DC
  Administered 2018-12-02 (×2): 3 mL via RESPIRATORY_TRACT
  Filled 2018-12-01 (×2): qty 3

## 2018-12-01 MED ORDER — LIDOCAINE 5 % EX PTCH
3.0000 | MEDICATED_PATCH | CUTANEOUS | Status: DC
  Administered 2018-12-01: 1 via TRANSDERMAL
  Administered 2018-12-02: 3 via TRANSDERMAL
  Filled 2018-12-01 (×2): qty 3

## 2018-12-01 MED ORDER — IPRATROPIUM-ALBUTEROL 0.5-2.5 (3) MG/3ML IN SOLN
3.0000 mL | Freq: Four times a day (QID) | RESPIRATORY_TRACT | Status: DC
  Administered 2018-12-01: 3 mL via RESPIRATORY_TRACT
  Filled 2018-12-01: qty 3

## 2018-12-01 MED ORDER — FOLIC ACID 1 MG PO TABS
1.0000 mg | ORAL_TABLET | Freq: Every day | ORAL | Status: DC
  Administered 2018-12-01 – 2018-12-02 (×2): 1 mg via ORAL
  Filled 2018-12-01 (×2): qty 1

## 2018-12-01 MED ORDER — SODIUM CHLORIDE 0.9 % IV SOLN
500.0000 mg | INTRAVENOUS | Status: AC
  Administered 2018-12-01: 500 mg via INTRAVENOUS
  Filled 2018-12-01: qty 500

## 2018-12-01 MED ORDER — LORAZEPAM 2 MG/ML IJ SOLN
1.0000 mg | Freq: Four times a day (QID) | INTRAMUSCULAR | Status: DC | PRN
  Administered 2018-12-01 – 2018-12-02 (×2): 1 mg via INTRAVENOUS
  Filled 2018-12-01 (×2): qty 1

## 2018-12-01 MED ORDER — VITAMIN B-1 100 MG PO TABS
100.0000 mg | ORAL_TABLET | Freq: Every day | ORAL | Status: DC
  Administered 2018-12-01 – 2018-12-02 (×2): 100 mg via ORAL
  Filled 2018-12-01 (×2): qty 1

## 2018-12-01 MED ORDER — ALBUTEROL (5 MG/ML) CONTINUOUS INHALATION SOLN
10.0000 mg/h | INHALATION_SOLUTION | RESPIRATORY_TRACT | Status: DC
  Administered 2018-12-01: 10 mg/h via RESPIRATORY_TRACT
  Filled 2018-12-01: qty 20

## 2018-12-01 MED ORDER — SODIUM CHLORIDE (PF) 0.9 % IJ SOLN
INTRAMUSCULAR | Status: AC
  Filled 2018-12-01: qty 50

## 2018-12-01 MED ORDER — OLANZAPINE 5 MG PO TABS: 2.5000 mg | ORAL_TABLET | Freq: Every day | ORAL | Status: DC | PRN

## 2018-12-01 MED ORDER — LORAZEPAM 1 MG PO TABS
1.0000 mg | ORAL_TABLET | Freq: Four times a day (QID) | ORAL | Status: DC | PRN
  Administered 2018-12-02: 1 mg via ORAL
  Filled 2018-12-01: qty 1

## 2018-12-01 MED ORDER — ASPIRIN 81 MG PO CHEW
324.0000 mg | CHEWABLE_TABLET | Freq: Once | ORAL | Status: AC
  Administered 2018-12-01: 324 mg via ORAL
  Filled 2018-12-01: qty 4

## 2018-12-01 MED ORDER — ALBUTEROL SULFATE HFA 108 (90 BASE) MCG/ACT IN AERS
8.0000 | INHALATION_SPRAY | Freq: Once | RESPIRATORY_TRACT | Status: AC
  Administered 2018-12-01: 8 via RESPIRATORY_TRACT
  Filled 2018-12-01: qty 6.7

## 2018-12-01 MED ORDER — SODIUM CHLORIDE 0.9 % IV BOLUS
1000.0000 mL | Freq: Once | INTRAVENOUS | Status: AC
  Administered 2018-12-01: 1000 mL via INTRAVENOUS

## 2018-12-01 MED ORDER — ATORVASTATIN CALCIUM 40 MG PO TABS
40.0000 mg | ORAL_TABLET | Freq: Every day | ORAL | Status: DC
  Administered 2018-12-01: 40 mg via ORAL
  Filled 2018-12-01: qty 1

## 2018-12-01 MED ORDER — PREDNISONE 20 MG PO TABS
40.0000 mg | ORAL_TABLET | Freq: Every day | ORAL | Status: DC
  Administered 2018-12-02: 40 mg via ORAL
  Filled 2018-12-01: qty 2

## 2018-12-01 MED ORDER — VITAMIN B-12 1000 MCG PO TABS
1000.0000 ug | ORAL_TABLET | Freq: Every day | ORAL | Status: DC
  Administered 2018-12-02: 1000 ug via ORAL
  Filled 2018-12-01: qty 1

## 2018-12-01 MED ORDER — OLANZAPINE 5 MG PO TABS
5.0000 mg | ORAL_TABLET | Freq: Every day | ORAL | Status: DC
  Administered 2018-12-01: 5 mg via ORAL
  Filled 2018-12-01: qty 1

## 2018-12-01 MED ORDER — SODIUM CHLORIDE 0.9 % IV SOLN
INTRAVENOUS | Status: DC
  Administered 2018-12-01 – 2018-12-02 (×3): via INTRAVENOUS

## 2018-12-01 MED ORDER — DEXAMETHASONE SODIUM PHOSPHATE 10 MG/ML IJ SOLN
10.0000 mg | Freq: Once | INTRAMUSCULAR | Status: AC
  Administered 2018-12-01: 10 mg via INTRAVENOUS
  Filled 2018-12-01: qty 1

## 2018-12-01 MED ORDER — SODIUM CHLORIDE 0.9 % IV BOLUS
500.0000 mL | Freq: Once | INTRAVENOUS | Status: AC
  Administered 2018-12-01: 500 mL via INTRAVENOUS

## 2018-12-01 MED ORDER — VITAMIN B-1 100 MG PO TABS
100.0000 mg | ORAL_TABLET | Freq: Every day | ORAL | Status: DC
  Filled 2018-12-01: qty 1

## 2018-12-01 MED ORDER — MAGNESIUM SULFATE 2 GM/50ML IV SOLN
2.0000 g | Freq: Once | INTRAVENOUS | Status: AC
  Administered 2018-12-01: 2 g via INTRAVENOUS
  Filled 2018-12-01: qty 50

## 2018-12-01 MED ORDER — ASPIRIN EC 81 MG PO TBEC
81.0000 mg | DELAYED_RELEASE_TABLET | Freq: Every day | ORAL | Status: DC
  Administered 2018-12-02: 81 mg via ORAL
  Filled 2018-12-01: qty 1

## 2018-12-01 MED ORDER — NICOTINE 14 MG/24HR TD PT24
14.0000 mg | MEDICATED_PATCH | Freq: Every day | TRANSDERMAL | Status: DC
  Administered 2018-12-01 – 2018-12-02 (×2): 14 mg via TRANSDERMAL
  Filled 2018-12-01 (×2): qty 1

## 2018-12-01 MED ORDER — IOHEXOL 350 MG/ML SOLN
100.0000 mL | Freq: Once | INTRAVENOUS | Status: AC | PRN
  Administered 2018-12-01: 100 mL via INTRAVENOUS

## 2018-12-01 MED ORDER — POLYETHYLENE GLYCOL 3350 17 G PO PACK: 17.0000 g | PACK | Freq: Every day | ORAL | Status: DC | PRN

## 2018-12-01 NOTE — ED Notes (Signed)
Called CT to remind them that pt needs a CT. They are aware.

## 2018-12-01 NOTE — ED Provider Notes (Signed)
[facility] DEPT Provider Note   CSN: [id number] Arrival date & time: 12/01/18  0413     History   Chief Complaint Chief Complaint  Patient presents with  . Shortness of Breath    HPI [name] Pace is a 68 y.o. male.     The history is provided by the patient.  Shortness of Breath Severity:  Moderate Onset quality:  Sudden Timing:  Constant Progression:  Unchanged Chronicity:  Recurrent Context: smoke exposure   Context: not URI   Relieved by:  Nothing Worsened by:  Nothing Ineffective treatments:  None tried Associated symptoms: wheezing   Associated symptoms: no chest pain, no diaphoresis, no fever, no headaches, no hemoptysis, no neck pain, no sore throat and no sputum production   Risk factors: alcohol use and tobacco use   Patient with polysubstance abuse and COPD clin  Past Medical History:  Diagnosis Date  . Alcohol abuse   . Anxiety   . Breast cancer ([place]) Left breast   Per patient diagnosed in early 3's.   . Cocaine abuse ([place]) 08/19/2018  . Coronary artery disease   . High cholesterol   . Left rib fracture 08/19/2018  . Prostate cancer ([place])    per patient diagnosed in early 71's  . Stroke [facility])     Patient Active Problem List   Diagnosis Date Noted  . Cocaine abuse ([place]) 08/19/2018  . Polysubstance abuse ([place]) 08/19/2018  . Left rib fracture 08/19/2018  . Potomania 08/19/2018  . Alcohol abuse with intoxication ([place]) 08/19/2018  . At risk for adverse drug event 06/12/2018  . Palliative care encounter   . Encephalopathy acute   . Delirium tremens ([place]) 05/18/2018  . Malnutrition of moderate degree 05/18/2018  . Acute respiratory failure ([place])   . Shock circulatory ([place])   . Multiple rib fractures 05/14/2018  . Pneumothorax, closed, traumatic, initial encounter 05/14/2018  . Traumatic fracture of ribs with pneumothorax   . Laceration of muscle(s) and tendon(s) of anterior muscle group at lower leg level, right  leg, initial encounter 02/10/2016  . Traumatic rupture of right anterior tibial tendon 02/10/2016  . Hip fracture, left ([place]) 07/02/2014  . Closed left hip fracture ([place]) 07/02/2014  . Hip fracture ([place]) 07/02/2014  . Intertrochanteric fracture of left femur ([place]) 07/02/2014  . History of stroke   . Preop examination   . Subclavian artery stenosis, left ([place]) 06/15/2014  . Chest pain 06/14/2014  . CAP (community acquired pneumonia) 06/14/2014  . Sepsis ([place]) 06/14/2014  . Alcohol abuse 06/14/2014  . CAD (coronary artery disease) 06/14/2014  . Tobacco abuse 06/14/2014    Past Surgical History:  Procedure Laterality Date  . CAROTID STENT    . CORONARY ARTERY BYPASS GRAFT    . FEMUR IM NAIL Left 07/02/2014   Procedure: INTRAMEDULLARY (IM) NAIL FEMORAL;  Surgeon: Elie Goody, MD;  Location: [facility];  Service: Orthopedics;  Laterality: Left;  . I&D EXTREMITY Right 02/10/2016   Procedure: IRRIGATION AND DEBRIDEMENT EXTREMITY;  Surgeon: Dorna Leitz, MD;  Location: [facility];  Service: Orthopedics;  Laterality: Right;  . TENDON REPAIR Right 02/10/2016   Procedure: Anterior TENDON REPAIR and wound exploration.;  Surgeon: Dorna Leitz, MD;  Location: [facility];  Service: Orthopedics;  Laterality: Right;        Home Medications    Prior to Admission medications   Medication Sig Start Date End Date Taking? Authorizing Provider  acetaminophen (TYLENOL) 500 MG tablet Take 500-1,000 mg by mouth every  6 (six) hours as needed for moderate pain.   Yes [provider]  aspirin EC 81 MG tablet Take 81 mg by mouth daily.   Yes [provider]  cholecalciferol (VITAMIN D3) 25 MCG (1000 UT) tablet Take 1,000 Units by mouth daily.   Yes [provider]  vitamin C (ASCORBIC ACID) 500 MG tablet Take 500 mg by mouth daily.   Yes [provider]  folic acid (FOLVITE) 1 MG tablet Take 1 tablet (1 mg total) by mouth daily. Patient not taking: Reported on 08/20/2018 06/09/18    Debbe Odea, MD  lidocaine (LIDODERM) 5 % Place 2 patches onto the skin daily. Remove & Discard patch within 12 hours or as directed by MD Patient not taking: Reported on 08/20/2018 06/09/18   Debbe Odea, MD  LORazepam (ATIVAN) 0.5 MG tablet Take 1 tablet (0.5 mg total) by mouth every 8 (eight) hours as needed for anxiety. Patient not taking: Reported on 08/20/2018 06/09/18   Debbe Odea, MD  OLANZapine (ZYPREXA) 2.5 MG tablet Take 2 tablets (5 mg total) by mouth at bedtime. Patient not taking: Reported on 08/20/2018 06/09/18   Debbe Odea, MD  OLANZapine (ZYPREXA) 5 MG tablet Take 0.5 tablets (2.5 mg total) by mouth daily as needed (agitation). Patient not taking: Reported on 08/20/2018 06/09/18   Debbe Odea, MD  polyethylene glycol (MIRALAX / GLYCOLAX) packet Take 17 g by mouth daily as needed for moderate constipation or severe constipation. Patient not taking: Reported on 08/20/2018 06/09/18   Debbe Odea, MD  thiamine 100 MG tablet Take 1 tablet (100 mg total) by mouth daily. Patient not taking: Reported on 08/20/2018 06/09/18   Debbe Odea, MD  traMADol (ULTRAM) 50 MG tablet Take 1 tablet (50 mg total) by mouth every 6 (six) hours as needed. Patient not taking: Reported on 12/01/2018 11/23/18   Jola Schmidt, MD  vitamin B-12 1000 MCG tablet Take 1 tablet (1,000 mcg total) by mouth daily. Patient not taking: Reported on 08/20/2018 06/09/18   Debbe Odea, MD    Family History Family History  Problem Relation Age of Onset  . Dementia Mother     Social History Social History   Tobacco Use  . Smoking status: Current Every Day Smoker    Packs/day: 1.00    Years: 42.00    Pack years: 42.00    Types: Cigarettes  . Smokeless tobacco: Never Used  Substance Use Topics  . Alcohol use: Yes    Comment: 4-5 beers a day  . Drug use: No     Allergies   Penicillins   Review of Systems Review of Systems  Constitutional: Negative for diaphoresis and fever.  HENT: Negative for sore  throat.   Respiratory: Positive for shortness of breath and wheezing. Negative for hemoptysis and sputum production.   Cardiovascular: Negative for chest pain.  Musculoskeletal: Negative for neck pain.  Neurological: Negative for headaches.  All other systems reviewed and are negative.    Physical Exam Updated Vital Signs BP 95/74   Pulse 77   Temp 98.2 F (36.8 C) (Oral)   Resp (!) 22   Ht 6\' 2"  (1.88 m)   Wt 63.5 kg   SpO2 100%   BMI 17.97 kg/m   Physical Exam Vitals signs and nursing note reviewed.  Constitutional:      General: He is not in acute distress.    Appearance: He is normal weight.  HENT:     Head: Normocephalic and atraumatic.  Nose: Nose normal.  Eyes:     Conjunctiva/sclera: Conjunctivae normal.     Pupils: Pupils are equal, round, and reactive to light.  Neck:     Musculoskeletal: Normal range of motion and neck supple.  Cardiovascular:     Rate and Rhythm: Normal rate and regular rhythm.     Pulses: Normal pulses.     Heart sounds: Normal heart sounds.  Pulmonary:     Effort: Tachypnea and prolonged expiration present.     Breath sounds: Wheezing present.  Abdominal:     General: Abdomen is flat. Bowel sounds are normal.     Tenderness: There is no abdominal tenderness. There is no guarding or rebound.  Musculoskeletal: Normal range of motion.  Skin:    General: Skin is warm.     Capillary Refill: Capillary refill takes less than 2 seconds.  Neurological:     General: No focal deficit present.     Mental Status: He is alert and oriented to person, place, and time.  Psychiatric:        Mood and Affect: Mood normal.        Behavior: Behavior normal.      ED Treatments / Results  Labs (all labs ordered are listed, but only abnormal results are displayed) Labs Reviewed  SARS CORONAVIRUS 2 (HOSPITAL ORDER, [facility])  CBC WITH DIFFERENTIAL/PLATELET  BASIC METABOLIC PANEL  TROPONIN I (HIGH SENSITIVITY)    TROPONIN I (HIGH SENSITIVITY)  BRAIN NATRIURETIC PEPTIDE    EKG EKG Interpretation  Date/Time:  Saturday December 01 2018 04:30:40 EDT Ventricular Rate:  70 PR Interval:    QRS Duration: 110 QT Interval:  448 QTC Calculation: 484 R Axis:   52 Text Interpretation:  Sinus rhythm LVH with secondary repolarization abnormality Probable anterior infarct, age indeterminate Confirmed by Randal Buba, Chistina Roston ([id number]) on 12/01/2018 5:15:47 AM   Radiology No results found.  Procedures Procedures (including critical care time)  Medications Ordered in ED Medications  albuterol (PROVENTIL) (2.5 MG/3ML) 0.083% nebulizer solution 5 mg (has no administration in time range)  lidocaine (LIDODERM) 5 % 3 patch (1 patch Transdermal Patch Applied 12/01/18 0552)  ipratropium (ATROVENT HFA) inhaler 4 puff (has no administration in time range)  magnesium sulfate IVPB 2 g 50 mL (has no administration in time range)  dexamethasone (DECADRON) injection 10 mg (has no administration in time range)  albuterol (VENTOLIN HFA) 108 (90 Base) MCG/ACT inhaler 8 puff (8 puffs Inhalation Given 12/01/18 0551)       Final Clinical Impressions(s) / ED Diagnoses   Will need admission when labs are resulted.  Signed out to Dr. Gilford Raid pending labs   Randal Buba, Duquan Gillooly, MD 12/01/18 [phone]

## 2018-12-01 NOTE — H&P (Signed)
History and Physical    [name] Pace DOB: Dec 02, 1950 DOA: 12/01/2018  PCP: Patient, No Pcp Per  Patient coming from: Home  I have personally briefly reviewed patient's old medical records in [place]  Chief Complaint: Chest pain/shortness of breath  HPI: [name] Pace is a 68 y.o. male with medical history significant of alcohol abuse, anxiety, CAD status post CABG and stents in the past, nicotine abuse and stroke presented to ER with a complaint of chest pain and shortness of breath.  Apparently, patient initially came to the emergency department on 11/23/2018 with a complaint of shortness of breath, back pain secondary to 2 back-to-back falls 2 days prior to that presentation when he was hit by a dog.  Extended work-up was done in the emergency department and he was found to have subacute T6-7-8 compression fracture With mild spinal stenosis and multilevel lumbar disc degeneration with moderate to severe spinal stenosis at level L3 and L4 and mild spinal stenosis at L2 and L3. According to the note, patient was able to ambulate in the emergency department so he was discharged with instructions to follow-up with neurosurgeon and PCP.  Patient return to the emergency department today with a complaint of chest pain and shortness of breath.  Patient, he has been having constant chest pain since more than a week and has been having some shortness of breath since last couple of days.  He describes pain located at whole anterior chest, achy, radiating to the back, no aggravating or relieving factor and waxes and wanes in intensity between 4 and 8 out of 10 and currently his pain is only 4 out of 10.  He tells me that he feels a lot better than what he came in earlier in the emergency department with.  He also has chills and sweating associated with that along with some dry cough.  He denies any dizziness, headache, fever, any problem with bowel movements or urination.  No sick contact or  recent travel.  ED Course: Upon arrival to the emergency department, he was hypotensive with blood pressure 86/66 and slightly tachypneic.  He was afebrile.  CBC and BMP unremarkable.  Troponin x1-.  COVID-19 ruled out.  BNP was slightly elevated at 286.  Chest x-ray unremarkable.  EKG shows sinus rhythm with no acute ST-T wave changes.  He was wheezy on exam and was thought to be having acute COPD exacerbation and was given steroids and hospital service was consulted to admit the patient for further management.  Upon further questioning, he tells me that he has never been told that he has any COPD however he continues to smoke 1 pack of cigarette per day.  I see documentation that he also has alcohol abuse but when I asked him, he denied it.  Review of Systems: As per HPI otherwise 10 point review of systems negative.    Past Medical History:  Diagnosis Date   Alcohol abuse    Anxiety    Breast cancer ([place]) Left breast   Per patient diagnosed in early 65's.    Cocaine abuse ([place]) 08/19/2018   Coronary artery disease    High cholesterol    Left rib fracture 08/19/2018   Prostate cancer ([place])    per patient diagnosed in early 50's   Stroke [facility])     Past Surgical History:  Procedure Laterality Date   CAROTID STENT     CORONARY ARTERY BYPASS GRAFT     FEMUR IM NAIL Left  07/02/2014   Procedure: INTRAMEDULLARY (IM) NAIL FEMORAL;  Surgeon: Elie Goody, MD;  [facility];  Service: Orthopedics;  Laterality: Left;   I&D EXTREMITY Right 02/10/2016   Procedure: IRRIGATION AND DEBRIDEMENT EXTREMITY;  Surgeon: Dorna Leitz, MD;  Location: [facility];  Service: Orthopedics;  Laterality: Right;   TENDON REPAIR Right 02/10/2016   Procedure: Anterior TENDON REPAIR and wound exploration.;  Surgeon: Dorna Leitz, MD;  Location: [facility];  Service: Orthopedics;  Laterality: Right;     reports that he has been smoking cigarettes. He has a 42.00 pack-year smoking history. He has never used  smokeless tobacco. He reports current alcohol use. He reports that he does not use drugs.  Allergies  Allergen Reactions   Penicillins Anaphylaxis    Did it involve swelling of the face/tongue/throat, SOB, or low BP? Yes Did it involve sudden or severe rash/hives, skin peeling, or any reaction on the inside of your mouth or nose? No Did you need to seek medical attention at a hospital or doctor's office? No When did it last happen?childhood If all above answers are NO, may proceed with cephalosporin use.     Family History  Problem Relation Age of Onset   Dementia Mother     Prior to Admission medications   Medication Sig Start Date End Date Taking? Authorizing Provider  acetaminophen (TYLENOL) 500 MG tablet Take 500-1,000 mg by mouth every 6 (six) hours as needed for moderate pain.   Yes [provider]  aspirin EC 81 MG tablet Take 81 mg by mouth daily.   Yes [provider]  cholecalciferol (VITAMIN D3) 25 MCG (1000 UT) tablet Take 1,000 Units by mouth daily.   Yes [provider]  vitamin C (ASCORBIC ACID) 500 MG tablet Take 500 mg by mouth daily.   Yes [provider]  folic acid (FOLVITE) 1 MG tablet Take 1 tablet (1 mg total) by mouth daily. Patient not taking: Reported on 08/20/2018 06/09/18   Debbe Odea, MD  lidocaine (LIDODERM) 5 % Place 2 patches onto the skin daily. Remove & Discard patch within 12 hours or as directed by MD Patient not taking: Reported on 08/20/2018 06/09/18   Debbe Odea, MD  LORazepam (ATIVAN) 0.5 MG tablet Take 1 tablet (0.5 mg total) by mouth every 8 (eight) hours as needed for anxiety. Patient not taking: Reported on 08/20/2018 06/09/18   Debbe Odea, MD  OLANZapine (ZYPREXA) 2.5 MG tablet Take 2 tablets (5 mg total) by mouth at bedtime. Patient not taking: Reported on 08/20/2018 06/09/18   Debbe Odea, MD  OLANZapine (ZYPREXA) 5 MG tablet Take 0.5 tablets (2.5 mg total) by mouth daily as needed  (agitation). Patient not taking: Reported on 08/20/2018 06/09/18   Debbe Odea, MD  polyethylene glycol (MIRALAX / GLYCOLAX) packet Take 17 g by mouth daily as needed for moderate constipation or severe constipation. Patient not taking: Reported on 08/20/2018 06/09/18   Debbe Odea, MD  thiamine 100 MG tablet Take 1 tablet (100 mg total) by mouth daily. Patient not taking: Reported on 08/20/2018 06/09/18   Debbe Odea, MD  traMADol (ULTRAM) 50 MG tablet Take 1 tablet (50 mg total) by mouth every 6 (six) hours as needed. Patient not taking: Reported on 12/01/2018 11/23/18   Jola Schmidt, MD  vitamin B-12 1000 MCG tablet Take 1 tablet (1,000 mcg total) by mouth daily. Patient not taking: Reported on 08/20/2018 06/09/18   Debbe Odea, MD    Physical Exam: Vitals:   12/01/18 0800  12/01/18 0801 12/01/18 0802 12/01/18 0842  BP: 90/71     Pulse: 67 60 68   Resp: (!) 22     Temp:      TempSrc:      SpO2: 97% 93% 97% 98%  Weight:      Height:        Constitutional: NAD, calm, comfortable Vitals:   12/01/18 0800 12/01/18 0801 12/01/18 0802 12/01/18 0842  BP: 90/71     Pulse: 67 60 68   Resp: (!) 22     Temp:      TempSrc:      SpO2: 97% 93% 97% 98%  Weight:      Height:       Eyes: PERRL, lids and conjunctivae normal, slightly lethargic and sick looking ENMT: Mucous membranes are dry. Posterior pharynx clear of any exudate or lesions.Normal dentition.  Neck: normal, supple, no masses, no thyromegaly Respiratory: clear to auscultation bilaterally, pan expiratory wheezes bilaterally, no crackles. Normal respiratory effort. No accessory muscle use.  Cardiovascular: Regular rate and rhythm, no murmurs / rubs / gallops. No extremity edema. 2+ pedal pulses. No carotid bruits.  Abdomen: no tenderness, no masses palpated. No hepatosplenomegaly. Bowel sounds positive.  Musculoskeletal: no clubbing / cyanosis. No joint deformity upper and lower extremities. Good ROM, no contractures. Normal muscle  tone.  Skin: no rashes, lesions, ulcers. No induration Neurologic: CN 2-12 grossly intact. Sensation intact, DTR normal. Strength 5/5 in all 4.  Psychiatric: Normal judgment and insight.  Lethargic and oriented x 3.  Depressed mood   Labs on Admission: I have personally reviewed following labs and imaging studies  CBC: Recent Labs  Lab 12/01/18 0548  WBC 7.7  NEUTROABS 4.7  HGB 14.4  HCT 42.5  MCV 106.3*  PLT 323   Basic Metabolic Panel: Recent Labs  Lab 12/01/18 0548  NA 134*  K 3.5  CL 98  CO2 22  GLUCOSE 88  BUN <5*  CREATININE 0.57*  CALCIUM 8.3*   GFR: Estimated Creatinine Clearance: 81.6 mL/min (A) (by C-G formula based on SCr of 0.57 mg/dL (L)). Liver Function Tests: No results for input(s): AST, ALT, ALKPHOS, BILITOT, PROT, ALBUMIN in the last 168 hours. No results for input(s): LIPASE, AMYLASE in the last 168 hours. No results for input(s): AMMONIA in the last 168 hours. Coagulation Profile: No results for input(s): INR, PROTIME in the last 168 hours. Cardiac Enzymes: No results for input(s): CKTOTAL, CKMB, CKMBINDEX, TROPONINI in the last 168 hours. BNP (last 3 results) No results for input(s): PROBNP in the last 8760 hours. HbA1C: No results for input(s): HGBA1C in the last 72 hours. CBG: No results for input(s): GLUCAP in the last 168 hours. Lipid Profile: No results for input(s): CHOL, HDL, LDLCALC, TRIG, CHOLHDL, LDLDIRECT in the last 72 hours. Thyroid Function Tests: No results for input(s): TSH, T4TOTAL, FREET4, T3FREE, THYROIDAB in the last 72 hours. Anemia Panel: No results for input(s): VITAMINB12, FOLATE, FERRITIN, TIBC, IRON, RETICCTPCT in the last 72 hours. Urine analysis:    Component Value Date/Time   COLORURINE YELLOW 11/23/2018 [place] 11/23/2018 0637   LABSPEC 1.006 11/23/2018 0637   PHURINE 6.0 11/23/2018 0637   GLUCOSEU NEGATIVE 11/23/2018 0637   HGBUR SMALL (A) 11/23/2018 [place]  11/23/2018 [place] 11/23/2018 0637   PROTEINUR NEGATIVE 11/23/2018 0637   UROBILINOGEN 0.2 06/14/2014 2135   NITRITE POSITIVE (A) 11/23/2018 0637   LEUKOCYTESUR MODERATE (A) 11/23/2018 5573  Radiological Exams on Admission: Dg Chest 2 View  Result Date: 12/01/2018 CLINICAL DATA:  Shortness of breath, worsening over the past few days, wheezing and lower LEFT field. Rib fractures from fall last week. EXAM: CHEST - 2 VIEW COMPARISON:  Chest x-ray dated 08/18/2018. Thoracic spine x-ray dated 11/23/2018. FINDINGS: Heart size and mediastinal contours are within normal limits. Median sternotomy wires in place, for presumed CABG. Mild scarring/fibrosis at the RIGHT lung base, stable. Lungs otherwise clear. No pleural effusion or pneumothorax seen. Compression fracture deformities of 3 contiguous vertebral bodies in the midthoracic spine, compatible with MRI findings on 11/23/2018 demonstrating likely subacute T6, T7 and T8 compression fractures. No appreciable change in the short-term interval. No new/acute osseous abnormality. Old displaced rib fractures on the RIGHT. No acute appearing rib fracture or displacement appreciated. IMPRESSION: 1. No active cardiopulmonary disease. No evidence of pneumonia or pulmonary edema. 2. Compression fracture deformities of 3 contiguous vertebral bodies in the midthoracic spine, compatible with MRI findings on 11/23/2018 demonstrating likely subacute T6, T7 and T8 compression fractures. No acute appearing rib fracture or displacement seen. Electronically Signed   By: Franki Cabot M.D.   On: 12/01/2018 05:08    EKG: Independently reviewed.  Sinus rhythm with no acute ST-T wave changes  Assessment/Plan Active Problems:   Chest pain   CAD (coronary artery disease)   COPD exacerbation (HCC)   T6 vertebral fracture (HCC)   T7 vertebral fracture (HCC)   T8 vertebral fracture (HCC)    Acute COPD exacerbation: This is mild.  Patient is tachypneic  but not hypoxic at all.  I will start him on prednisone 40 mg p.o. daily for 5 days along with azithromycin.  Scheduled DuoNeb.  Will check respiratory viral panel.  COVID-19 negative.  Nicotine abuse: Counseled regarding quitting smoking.  Nicotine patch.  History of CAD with chest pain: EKG unremarkable.  Troponin x1-.  Follow serial cardiac enzymes.  Monitor on telemetry and order transthoracic echo.  Check lipid panel and hemoglobin A1c.  Interestingly, he is not on any statins.  We will start him on atorvastatin 40 mg p.o. daily.  Psychiatric disorder: I could not find any specific psychiatric disorder and patient is unable to tell me however he is on olanzapine and benzodiazepine at home and I will resume that.  Subacute T6-7-8 compression fracture: He has normal and equal strength in all 4 extremities.  He did not complain of any problem with walking.  His chest pain could very well be referred pain from the back.  Will consult PT OT to see how he does.  If needed, consider consulting neurosurgery while he is inpatient.  DVT prophylaxis: Lovenox Code Status: Full code Family Communication: None present at bedside.  Discussed in length with the patient. Disposition Plan: Likely home in 24 to 48 hours. Consults called: None Admission status: Observation   Darliss Cheney MD [facility] Pager [phone]  If 7PM-7AM, please contact night-coverage [email or web address] Password [email or web address]  12/01/2018, 8:59 AM

## 2018-12-01 NOTE — ED Triage Notes (Signed)
[facility] transported pt from home to [facility] ED and reports the following:  Pt called EMS because SOB has progressively gotten worse the past few days. Wheezing in lower left fields. Rib fractures from fall last week. 12-lead ecg LVH and possible lateral ischemia.  Daughter reports pt has a history of Wernicke's, stage IV alcoholism, unsteady gate, fall risk, heart history, heart failure, stints.   [name] Pace, daughter is up to date with his recent hospitalizations and would like a call [phone].  Gwynneth Macleod, roommate, might pick pt from hospital. His cell [phone].

## 2018-12-01 NOTE — ED Notes (Signed)
Report given to [facility]. Pt to go to CT prior to going to the floor.

## 2018-12-01 NOTE — ED Notes (Signed)
Patient transported to CT 

## 2018-12-01 NOTE — ED Notes (Signed)
Informed Ravi Pahwani of pt's VS.

## 2018-12-01 NOTE — ED Notes (Signed)
Transported to CT 

## 2018-12-01 NOTE — ED Notes (Signed)
Bed: WA20 Expected date:  Expected time:  Means of arrival:  Comments: EMS 68 yo male from home SOB/wheezing BP 150/102 CBG 88

## 2018-12-01 NOTE — ED Notes (Signed)
Informed Dr. Okey Regal that pt c/o chest pain, epigastric area. Feels that it improves by laying on side with covers over head.

## 2018-12-01 NOTE — ED Notes (Signed)
Patient transported to X-ray 

## 2018-12-01 NOTE — ED Provider Notes (Signed)
Pt signed out by Dr. Randal Buba pending labs.  Pt's labs reviewed.  CXR shows no pna, but does show his 3 compression fx.  Pt's covid is negative.  Pt is still very sob and wheezy.  The pt's bp is still a little low (90s).  Continuous neb will be given as well as 1L NS.  The pt was d/w Dr. Doristine Bosworth ([facility]) for admission.   Isla Pence, MD 12/01/18 [phone]

## 2018-12-02 ENCOUNTER — Observation Stay (HOSPITAL_BASED_OUTPATIENT_CLINIC_OR_DEPARTMENT_OTHER): Payer: Medicare Other

## 2018-12-02 DIAGNOSIS — S22060A Wedge compression fracture of T7-T8 vertebra, initial encounter for closed fracture: Secondary | ICD-10-CM

## 2018-12-02 DIAGNOSIS — I2510 Atherosclerotic heart disease of native coronary artery without angina pectoris: Secondary | ICD-10-CM

## 2018-12-02 DIAGNOSIS — F1010 Alcohol abuse, uncomplicated: Secondary | ICD-10-CM

## 2018-12-02 DIAGNOSIS — J441 Chronic obstructive pulmonary disease with (acute) exacerbation: Secondary | ICD-10-CM | POA: Diagnosis not present

## 2018-12-02 DIAGNOSIS — R0789 Other chest pain: Secondary | ICD-10-CM

## 2018-12-02 DIAGNOSIS — S22050A Wedge compression fracture of T5-T6 vertebra, initial encounter for closed fracture: Secondary | ICD-10-CM

## 2018-12-02 DIAGNOSIS — R079 Chest pain, unspecified: Secondary | ICD-10-CM

## 2018-12-02 DIAGNOSIS — R931 Abnormal findings on diagnostic imaging of heart and coronary circulation: Secondary | ICD-10-CM | POA: Diagnosis present

## 2018-12-02 LAB — RESPIRATORY PANEL BY PCR

## 2018-12-02 LAB — COMPREHENSIVE METABOLIC PANEL
ALT: 16 U/L (ref 0–44)
AST: 18 U/L (ref 15–41)
Albumin: 2.6 g/dL — ABNORMAL LOW (ref 3.5–5.0)
Alkaline Phosphatase: 107 U/L (ref 38–126)
Anion gap: 10 (ref 5–15)
BUN: 10 mg/dL (ref 8–23)
CO2: 20 mmol/L — ABNORMAL LOW (ref 22–32)
Calcium: 8.1 mg/dL — ABNORMAL LOW (ref 8.9–10.3)
Chloride: 108 mmol/L (ref 98–111)
Creatinine, Ser: 0.6 mg/dL — ABNORMAL LOW (ref 0.61–1.24)
GFR calc Af Amer: >60 mL/min (ref >60)
GFR calc non Af Amer: >60 mL/min (ref >60)
Glucose, Bld: 151 mg/dL — ABNORMAL HIGH (ref 70–99)
Potassium: 3.4 mmol/L — ABNORMAL LOW (ref 3.5–5.1)
Sodium: 138 mmol/L (ref 135–145)
Total Bilirubin: 0.3 mg/dL (ref 0.3–1.2)
Total Protein: 5.3 g/dL — ABNORMAL LOW (ref 6.5–8.1)

## 2018-12-02 LAB — HIV ANTIBODY (ROUTINE TESTING W REFLEX): HIV Screen 4th Generation wRfx: NONREACTIVE

## 2018-12-02 LAB — CBC
HCT: 36.3 % — ABNORMAL LOW (ref 39.0–52.0)
Hemoglobin: 11.9 g/dL — ABNORMAL LOW (ref 13.0–17.0)
MCH: 35.4 pg — ABNORMAL HIGH (ref 26.0–34.0)
MCHC: 32.8 g/dL (ref 30.0–36.0)
MCV: 108 fL — ABNORMAL HIGH (ref 80.0–100.0)
Platelets: 321 10*3/uL (ref 150–400)
RBC: 3.36 MIL/uL — ABNORMAL LOW (ref 4.22–5.81)
RDW: 13.3 % (ref 11.5–15.5)
WBC: 10.5 10*3/uL (ref 4.0–10.5)
nRBC: 0 % (ref 0.0–0.2)

## 2018-12-02 LAB — ECHOCARDIOGRAM COMPLETE
Height: 74 in
Weight: 2240 oz

## 2018-12-02 MED ORDER — PREDNISONE 20 MG PO TABS: 20.0000 mg | ORAL_TABLET | Freq: Every day | ORAL | 0 refills | Status: DC

## 2018-12-02 MED ORDER — CHLORDIAZEPOXIDE HCL 25 MG PO CAPS
25.0000 mg | ORAL_CAPSULE | Freq: Four times a day (QID) | ORAL | Status: DC
  Administered 2018-12-02: 25 mg via ORAL
  Filled 2018-12-02: qty 1

## 2018-12-02 MED ORDER — ALBUTEROL SULFATE HFA 108 (90 BASE) MCG/ACT IN AERS: 2.0000 | INHALATION_SPRAY | Freq: Four times a day (QID) | RESPIRATORY_TRACT | 0 refills | Status: DC | PRN

## 2018-12-02 MED ORDER — LORATADINE 10 MG PO TABS
10.0000 mg | ORAL_TABLET | Freq: Every day | ORAL | Status: DC
  Administered 2018-12-02: 10 mg via ORAL
  Filled 2018-12-02: qty 1

## 2018-12-02 MED ORDER — ENSURE ENLIVE PO LIQD
237.0000 mL | Freq: Two times a day (BID) | ORAL | Status: DC
  Administered 2018-12-02: 237 mL via ORAL

## 2018-12-02 MED ORDER — ADULT MULTIVITAMIN W/MINERALS CH: 1.0000 | ORAL_TABLET | Freq: Every day | ORAL | Status: DC

## 2018-12-02 MED ORDER — PANTOPRAZOLE SODIUM 40 MG PO TBEC: 40.0000 mg | DELAYED_RELEASE_TABLET | Freq: Every day | ORAL | 0 refills | Status: DC

## 2018-12-02 MED ORDER — BENZONATATE 200 MG PO CAPS: 200.0000 mg | ORAL_CAPSULE | Freq: Three times a day (TID) | ORAL | 0 refills | Status: DC

## 2018-12-02 MED ORDER — LORATADINE 10 MG PO TABS: 10.0000 mg | ORAL_TABLET | Freq: Every day | ORAL | 0 refills | Status: DC

## 2018-12-02 MED ORDER — CHLORDIAZEPOXIDE HCL 25 MG PO CAPS: 25.0000 mg | ORAL_CAPSULE | Freq: Every day | ORAL | Status: DC

## 2018-12-02 MED ORDER — FLUTICASONE PROPIONATE 50 MCG/ACT NA SUSP: 2.0000 | Freq: Every day | NASAL | 0 refills | Status: DC

## 2018-12-02 MED ORDER — BENZONATATE 100 MG PO CAPS
200.0000 mg | ORAL_CAPSULE | Freq: Three times a day (TID) | ORAL | Status: DC
  Administered 2018-12-02: 200 mg via ORAL
  Filled 2018-12-02: qty 2

## 2018-12-02 MED ORDER — HYDROXYZINE HCL 25 MG PO TABS: 25.0000 mg | ORAL_TABLET | Freq: Four times a day (QID) | ORAL | 0 refills | Status: DC | PRN

## 2018-12-02 MED ORDER — AZITHROMYCIN 250 MG PO TABS: 250.0000 mg | ORAL_TABLET | Freq: Every day | ORAL | 0 refills | Status: AC

## 2018-12-02 MED ORDER — CHLORDIAZEPOXIDE HCL 25 MG PO CAPS: 25.0000 mg | ORAL_CAPSULE | ORAL | Status: DC

## 2018-12-02 MED ORDER — PANTOPRAZOLE SODIUM 40 MG PO TBEC
40.0000 mg | DELAYED_RELEASE_TABLET | Freq: Every day | ORAL | Status: DC
  Administered 2018-12-02: 40 mg via ORAL
  Filled 2018-12-02: qty 1

## 2018-12-02 MED ORDER — CHLORDIAZEPOXIDE HCL 25 MG PO CAPS: 25.0000 mg | ORAL_CAPSULE | Freq: Four times a day (QID) | ORAL | Status: DC | PRN

## 2018-12-02 MED ORDER — TRAMADOL HCL 50 MG PO TABS
50.0000 mg | ORAL_TABLET | Freq: Four times a day (QID) | ORAL | Status: DC | PRN
  Administered 2018-12-02: 50 mg via ORAL
  Filled 2018-12-02: qty 1

## 2018-12-02 MED ORDER — BENZONATATE 200 MG PO CAPS: 200.0000 mg | ORAL_CAPSULE | Freq: Three times a day (TID) | ORAL | 0 refills | Status: AC

## 2018-12-02 MED ORDER — THIAMINE HCL 100 MG PO TABS: 100.0000 mg | ORAL_TABLET | Freq: Every day | ORAL | Status: DC

## 2018-12-02 MED ORDER — ATORVASTATIN CALCIUM 40 MG PO TABS: 40.0000 mg | ORAL_TABLET | Freq: Every day | ORAL | 0 refills | Status: DC

## 2018-12-02 MED ORDER — CHLORDIAZEPOXIDE HCL 25 MG PO CAPS: 25.0000 mg | ORAL_CAPSULE | Freq: Three times a day (TID) | ORAL | Status: DC

## 2018-12-02 MED ORDER — LOPERAMIDE HCL 2 MG PO CAPS: 2.0000 mg | ORAL_CAPSULE | ORAL | Status: DC | PRN

## 2018-12-02 MED ORDER — FLUTICASONE PROPIONATE 50 MCG/ACT NA SUSP
2.0000 | Freq: Every day | NASAL | Status: DC
  Administered 2018-12-02: 2 via NASAL
  Filled 2018-12-02: qty 16

## 2018-12-02 MED ORDER — HYDROXYZINE HCL 25 MG PO TABS: 25.0000 mg | ORAL_TABLET | Freq: Four times a day (QID) | ORAL | Status: DC | PRN

## 2018-12-02 MED ORDER — POTASSIUM CHLORIDE CRYS ER 20 MEQ PO TBCR
40.0000 meq | EXTENDED_RELEASE_TABLET | Freq: Once | ORAL | Status: AC
  Administered 2018-12-02: 40 meq via ORAL
  Filled 2018-12-02: qty 2

## 2018-12-02 MED ORDER — SPIRIVA HANDIHALER 18 MCG IN CAPS: 18.0000 ug | ORAL_CAPSULE | Freq: Every day | RESPIRATORY_TRACT | 0 refills | Status: DC

## 2018-12-02 MED ORDER — VITAMIN B-1 100 MG PO TABS: 100.0000 mg | ORAL_TABLET | Freq: Every day | ORAL | Status: DC

## 2018-12-02 MED ORDER — MOMETASONE FURO-FORMOTEROL FUM 100-5 MCG/ACT IN AERO
2.0000 | INHALATION_SPRAY | Freq: Two times a day (BID) | RESPIRATORY_TRACT | Status: DC
  Filled 2018-12-02: qty 8.8

## 2018-12-02 MED ORDER — THIAMINE HCL 100 MG/ML IJ SOLN: 100.0000 mg | Freq: Once | INTRAMUSCULAR | Status: DC

## 2018-12-02 MED ORDER — IPRATROPIUM-ALBUTEROL 0.5-2.5 (3) MG/3ML IN SOLN: 3.0000 mL | Freq: Two times a day (BID) | RESPIRATORY_TRACT | Status: DC

## 2018-12-02 MED ORDER — ONDANSETRON 4 MG PO TBDP: 4.0000 mg | ORAL_TABLET | Freq: Four times a day (QID) | ORAL | Status: DC | PRN

## 2018-12-02 MED ORDER — MOMETASONE FURO-FORMOTEROL FUM 100-5 MCG/ACT IN AERO: 2.0000 | INHALATION_SPRAY | Freq: Two times a day (BID) | RESPIRATORY_TRACT | 0 refills | Status: DC

## 2018-12-02 MED ORDER — NICOTINE 14 MG/24HR TD PT24: 14.0000 mg | MEDICATED_PATCH | Freq: Every day | TRANSDERMAL | 0 refills | Status: DC

## 2018-12-02 NOTE — Progress Notes (Signed)
  Home health agencies that serve [id number].        Home Health Agencies Search Results  Results List Table  Home Health Agency Information Quality of Patient Care Rating Patient Survey Summary Rating  [facility] [phone] 4 out of 5 stars 4 out of 5 stars  [facility] [phone] 3 out of 5 stars 5 out of 5 stars  [facility] [phone] 3 out of 5 stars 4 out of 5 stars  [facility] [phone] 4  out of 5 stars 3 out of 5 stars  [facility] [phone] 4 out of 5 stars 4 out of 5 stars  [facility] [phone] 4  out of 5 stars 4 out of 5 stars  [facility] [phone] 4 out of 5 stars 4 out of 5 stars  [facility] [phone] 4 out of 5 stars 4 out of 5 stars  [facility] [phone] 3  out of 5 stars 4 out of 5 stars  [facility] [phone] 3 out of 5 stars 4 out of 5 stars  [facility] [phone] 4 out of 5 stars Not Available12  [facility] [phone] 3  out of 5 stars 3 out of 5 stars  [facility] [phone] 3  out of 5 stars 4 out of 5 stars  [facility] [phone] 3  out of 5 stars 4 out of 5 stars  [facility] [phone] 3  out of 5 stars 3 out of 5 stars  [facility] [phone] 3  out of 5 stars Not Available11  [facility] [phone] 3  out of 5 stars 4 out of 5 stars  [facility] [phone] 4  out of 5 stars 3 out of 5 stars  [facility] [phone] 4  out of 5 stars 2 out of 5 stars   Home Health Footnotes  Footnote number Footnote as displayed on Home Health Compare  1 This agency provides services under a federal waiver program to non-traditional, chronic long term population.  2 This agency provides services to a  special needs population.  3 Not Available.  4 The number of patient episodes for this measure is too small to report.  5 This measure currently does not have data or provider has been certified/recertified for less than 6 months.  6 The national average for this measure is not provided because of state-to-state differences in data collection.  7 Medicare is not displaying rates for this measure for any home health agency, because of an issue with the data.  8 There were problems with the data and they are being corrected.  9 Zero, or very few, patients met the survey's rules for inclusion. The scores shown, if any, reflect a very small number of surveys and may not accurately tell how an agency is doing.  10 Survey results are based on less than 12 months of data.  11 Fewer than 70 patients completed the survey. Use the scores shown, if any, with caution as the number of surveys may be too low to accurately tell how an agency is doing.  12 No survey results are available for this period.  13 Data suppressed by CMS   for one or more quarters.

## 2018-12-02 NOTE — Care Management Obs Status (Signed)
[place] NOTIFICATION   Patient Details  Name: [name] Pace MRN: [id number] Date of Birth: 02/04/1951   Medicare Observation Status Notification Given:  Yes    Joaquin Courts, RN 12/02/2018, 11:50 AM

## 2018-12-02 NOTE — TOC Initial Note (Signed)
Transition of Care [facility]) - Initial/Assessment Note    Patient Details  Name: [name] Pace MRN: [id number] Date of Birth: May 29, 1951  [facility]) CM/SW Contact:    Joaquin Courts, RN Phone Number: 12/02/2018, 2:01 PM  Clinical Narrative: CM spoke with patient at bedside. [facility] PT/OT/aide/social work.  Patient reports he needs dme 3-in-1. Set-up pending physician orders.                   Expected Discharge Plan: [place] Barriers to Discharge: Continued Medical Work up   Patient Goals and CMS Choice Patient states their goals for this hospitalization and ongoing recovery are:: to go home CMS Medicare.gov Compare Post Acute Care list provided to:: Patient Choice offered to / list presented to : Patient  Expected Discharge Plan and Services Expected Discharge Plan: [place]   Discharge Planning Services: CM Consult Post Acute Care Choice: [place] arrangements for the past 2 months: Apartment                 DME Arranged: N/A DME Agency: NA       HH Arranged: Social Work, Nurse's Aide, OT, PT [place] Agency: [facility] [place] Date [place] Agency Contacted: 12/02/18 Time [place]: 1400 Representative spoke with at [place]: Glyn Ade  Prior Living Arrangements/Services Living arrangements for the past 2 months: Apartment Lives with:: Significant Other Patient language and need for interpreter reviewed:: Yes Do you feel safe going back to the place where you live?: Yes      Need for Family Participation in Patient Care: Yes (Comment) Care giver support system in place?: Yes (comment)   Criminal Activity/Legal Involvement Pertinent to Current Situation/Hospitalization: No - Comment as needed  Activities of Daily Living Home Assistive Devices/Equipment: None ADL Screening (condition at time of admission) Patient's cognitive ability adequate to safely  complete daily activities?: Yes Is the patient deaf or have difficulty hearing?: No Does the patient have difficulty seeing, even when wearing glasses/contacts?: Yes Does the patient have difficulty concentrating, remembering, or making decisions?: No Patient able to express need for assistance with ADLs?: Yes Does the patient have difficulty dressing or bathing?: No Independently performs ADLs?: Yes (appropriate for developmental age) Does the patient have difficulty walking or climbing stairs?: Yes Weakness of Legs: None Weakness of Arms/Hands: None  Permission Sought/Granted                  Emotional Assessment Appearance:: Appears stated age Attitude/Demeanor/Rapport: Engaged Affect (typically observed): Accepting Orientation: : Oriented to Self, Oriented to Place, Oriented to  Time, Oriented to Situation   Psych Involvement: No (comment)  Admission diagnosis:  COPD exacerbation ([place]) [J44.1] Compression fracture of T7 vertebra, initial encounter ([place]) [S22.060A] Compression fracture of T8 vertebra, initial encounter ([place]) [S22.060A] Compression fracture of T6 vertebra, initial encounter [facility]) [S22.050A] Patient Active Problem List   Diagnosis Date Noted  . COPD exacerbation ([place]) 12/01/2018  . T6 vertebral fracture ([place]) 12/01/2018  . T7 vertebral fracture ([place]) 12/01/2018  . T8 vertebral fracture ([place]) 12/01/2018  . Cocaine abuse ([place]) 08/19/2018  . Polysubstance abuse ([place]) 08/19/2018  . Left rib fracture 08/19/2018  . Potomania 08/19/2018  . Alcohol abuse with intoxication ([place]) 08/19/2018  . At risk for adverse drug event 06/12/2018  . Palliative care encounter   . Encephalopathy acute   . Delirium tremens ([place]) 05/18/2018  . Malnutrition of  moderate degree 05/18/2018  . Acute respiratory failure ([place])   . Shock circulatory ([place])   . Multiple rib fractures 05/14/2018  . Pneumothorax, closed, traumatic, initial encounter 05/14/2018  . Traumatic fracture of  ribs with pneumothorax   . Laceration of muscle(s) and tendon(s) of anterior muscle group at lower leg level, right leg, initial encounter 02/10/2016  . Traumatic rupture of right anterior tibial tendon 02/10/2016  . Hip fracture, left ([place]) 07/02/2014  . Closed left hip fracture ([place]) 07/02/2014  . Hip fracture ([place]) 07/02/2014  . Intertrochanteric fracture of left femur ([place]) 07/02/2014  . History of stroke   . Preop examination   . Subclavian artery stenosis, left ([place]) 06/15/2014  . Chest pain 06/14/2014  . CAP (community acquired pneumonia) 06/14/2014  . Sepsis ([place]) 06/14/2014  . Alcohol abuse 06/14/2014  . CAD (coronary artery disease) 06/14/2014  . Tobacco abuse 06/14/2014   PCP:  Patient, No Pcp Per Pharmacy:   [facility] [place] - [address] AT [address] [place] [address] Phone: [phone] Fax: [phone]     Social Determinants of Health (SDOH) Interventions    Readmission Risk Interventions No flowsheet data found.

## 2018-12-02 NOTE — TOC Progression Note (Signed)
Transition of Care [facility]) - Progression Note    Patient Details  Name: [name] Pace MRN: [id number] Date of Birth: May 08, 1951  Transition of Care [facility]) CM/SW Contact  Joaquin Courts, RN Phone Number: 12/02/2018, 2:09 PM  Clinical Narrative: [facility] set-up to deliver 3-in-1 to bedside for home use.    Expected Discharge Plan: [place] Barriers to Discharge: Continued Medical Work up  Expected Discharge Plan and Services Expected Discharge Plan: [place]   Discharge Planning Services: CM Consult Post Acute Care Choice: [place] arrangements for the past 2 months: Apartment                 DME Arranged: 3-N-1 DME Agency: [facility] Date DME Agency Contacted: 12/02/18 Time DME Agency Contacted: [phone] Representative spoke with at DME Agency: [place]: Social Work, Nurse's Aide, OT, PT [place] Agency: [facility] [place] Date [place]: 12/02/18 Time [place]: 1400 Representative spoke with at [place]: [place] (SDOH) Interventions    Readmission Risk Interventions No flowsheet data found.

## 2018-12-02 NOTE — Discharge Summary (Signed)
Physician Discharge Summary  [name] Pace ZOX:[id number] DOB: Dec 23, 1950 DOA: 12/01/2018  PCP: Patient, No Pcp Per  Admit date: 12/01/2018 Discharge date: 12/02/2018  Time spent: 50 minutes  Recommendations for Outpatient Follow-up:  1. Follow-up with PCP in 1 to 2 weeks.  Home health agency will set up patient with PCP. 2. Follow-up with neurosurgery as previously scheduled. 3. Follow-up with cardiology.  Office will call with appointment time for follow-up on abnormal 2D echo. 4. Patient be discharged home with home health services.   Discharge Diagnoses:  Principal Problem:   COPD exacerbation ([place]) Active Problems:   Chest pain   Alcohol abuse   CAD (coronary artery disease)   History of stroke   T6 vertebral fracture (HCC)   T7 vertebral fracture (HCC)   T8 vertebral fracture (HCC)   Abnormal echocardiogram   Compression fracture of T6 vertebra (HCC)   Compression fracture of T7 vertebra (HCC)   Compression fracture of T8 vertebra (HCC)   Discharge Condition: Stable and improved  Diet recommendation: Heart healthy  Filed Weights   12/01/18 0430 12/01/18 0435 12/02/18 0801  Weight: 68 kg 63.5 kg 63.8 kg    History of present illness:  Per Dr. Bettey Mare is a 68 y.o. male with medical history significant of alcohol abuse, anxiety, CAD status post CABG and stents in the past, nicotine abuse and stroke presented to ER with a complaint of chest pain and shortness of breath.  Apparently, patient initially came to the emergency department on 11/23/2018 with a complaint of shortness of breath, back pain secondary to 2 back-to-back falls 2 days prior to that presentation when he was hit by a dog.  Extended work-up was done in the emergency department and he was found to have subacute T6-7-8 compression fracture With mild spinal stenosis and multilevel lumbar disc degeneration with moderate to severe spinal stenosis at level L3 and L4 and mild spinal stenosis at L2 and  L3. According to the note, patient was able to ambulate in the emergency department so he was discharged with instructions to follow-up with neurosurgeon and PCP.  Patient return to the emergency department today with a complaint of chest pain and shortness of breath.  Patient, he has been having constant chest pain since more than a week and has been having some shortness of breath since last couple of days.  He describes pain located at whole anterior chest, achy, radiating to the back, no aggravating or relieving factor and waxes and wanes in intensity between 4 and 8 out of 10 and currently his pain is only 4 out of 10.  He tells me that he feels a lot better than what he came in earlier in the emergency department with.  He also has chills and sweating associated with that along with some dry cough.  He denies any dizziness, headache, fever, any problem with bowel movements or urination.  No sick contact or recent travel.  ED Course: Upon arrival to the emergency department, he was hypotensive with blood pressure 86/66 and slightly tachypneic.  He was afebrile.  CBC and BMP unremarkable.  Troponin x1-.  COVID-19 ruled out.  BNP was slightly elevated at 286.  Chest x-ray unremarkable.  EKG shows sinus rhythm with no acute ST-T wave changes.  He was wheezy on exam and was thought to be having acute COPD exacerbation and was given steroids and hospital service was consulted to admit the patient for further management.  Upon further questioning, he tells  me that he has never been told that he has any COPD however he continues to smoke 1 pack of cigarette per day.  I see documentation that he also has alcohol abuse but when I asked him, he denied it.  Hospital Course:  1 acute COPD exacerbation Patient presented with mild acute COPD exacerbation noted to be tachypneic but not hypoxic.  CT angiogram chest was negative for any acute abnormalities.  Patient was placed on prednisone 40 mg daily, as well as  azithromycin and scheduled duo nebs.  COVID-19 which was ordered was negative.  Respiratory viral panel which was ordered was negative.  Patient improved clinically.  Patient be discharged home on a steroid taper, azithromycin x4 days, Dulera, Spiriva, albuterol MDIs.  Tobacco cessation stressed to patient.  Outpatient follow-up with PCP.  2.  Nicotine abuse Tobacco cessation.  Patient placed on a nicotine patch.  3.  Subacute T6, T7, T8 compression fracture Patient noted to have compression fracture on CT which was similar to plain films which were done on his prior ED visit.  Patient had no focal neurological deficits.  Patient with normal and equal strength in all 4 extremities.  Patient had no complaints with walking.  Patient was seen by PT OT who recommended home health therapies.  Patient was placed on the Lidoderm patch he was on prior to admission.  Patient is supposed to follow-up with neurosurgery in the outpatient setting which he is encouraged to do.  4.  Chest pain/abnormal 2D echo/history of coronary artery disease status post CABG Patient had presented with some chest pain which may have likely been secondary to his compression fractures.  During the hospitalization patient did not have any chest pain.  CT angiogram chest which was done was negative for PE and negative for acute abnormalities.  Cardiac enzymes which were cycled were stable and flattened.  EKG with no ischemic changes noted.  Patient noted to have a prior cardiac history status post CABG.  2D echo was obtained 2D echo which was obtained with EF of 55%, basal inferior hypokinesis noted.  Case was discussed with cardiology, Dr. Harl Bowie who reviewed a 2D echo, EKG and lab work and felt with patient's prior cardiac history patient likely did not have an acute cardiac episode and patient may be followed in the outpatient setting.  5.  Hypokalemia Repleted.  6.  History of alcohol abuse Patient noted to have some mild tremors  on admission.  Patient maintained on the Ativan withdrawal protocol.  Patient did not have any significant DTs during the hospitalization.  Alcohol cessation was stressed to patient.  Outpatient follow-up with PCP.    #7 psychiatric disorder No specific psychiatric disorder was noted in the chart.  Patient unable to tell admitting physician.  Patient noted to have been on olanzapine and benzodiazepine at home.  Olanzapine was resumed during this hospitalization.  Outpatient follow-up.  Procedures:  2D echo 12/02/2018  Chest x-ray 12/01/2018  CT angiogram chest 12/01/2018  Consultations:  Curb sided cardiology: Dr. Harl Bowie  Discharge Exam: Vitals:   12/02/18 1324 12/02/18 1339  BP:  (!) 155/92  Pulse:  85  Resp:  16  Temp:  97.7 F (36.5 C)  SpO2: 97% 98%    General: NAD Cardiovascular: RRR Respiratory: CTAB.  Fair air movement.  Discharge Instructions   Discharge Instructions    Diet - low sodium heart healthy   Complete by: As directed    Increase activity slowly   Complete by: As directed  Allergies as of 12/02/2018      Reactions   Penicillins Anaphylaxis   Did it involve swelling of the face/tongue/throat, SOB, or low BP? Yes Did it involve sudden or severe rash/hives, skin peeling, or any reaction on the inside of your mouth or nose? No Did you need to seek medical attention at a hospital or doctor's office? No When did it last happen?childhood If all above answers are "NO", may proceed with cephalosporin use.      Medication List    STOP taking these medications   LORazepam 0.5 MG tablet Commonly known as: ATIVAN     TAKE these medications   acetaminophen 500 MG tablet Commonly known as: TYLENOL Take 500-1,000 mg by mouth every 6 (six) hours as needed for moderate pain.   albuterol 108 (90 Base) MCG/ACT inhaler Commonly known as: VENTOLIN HFA Inhale 2 puffs into the lungs every 6 (six) hours as needed for wheezing or shortness of breath.  Use 2 puffs 3 times daily x5 days, then every 6 hours as needed.   aspirin EC 81 MG tablet Take 81 mg by mouth daily.   atorvastatin 40 MG tablet Commonly known as: LIPITOR Take 1 tablet (40 mg total) by mouth daily at 6 PM.   azithromycin 250 MG tablet Commonly known as: ZITHROMAX Take 1 tablet (250 mg total) by mouth daily for 4 days.   benzonatate 200 MG capsule Commonly known as: TESSALON Take 1 capsule (200 mg total) by mouth 3 (three) times daily for 5 days.   cholecalciferol 25 MCG (1000 UT) tablet Commonly known as: VITAMIN D3 Take 1,000 Units by mouth daily.   cyanocobalamin 1000 MCG tablet Take 1 tablet (1,000 mcg total) by mouth daily.   fluticasone 50 MCG/ACT nasal spray Commonly known as: FLONASE Place 2 sprays into both nostrils daily. Start taking on: December 02, 1608   folic acid 1 MG tablet Commonly known as: FOLVITE Take 1 tablet (1 mg total) by mouth daily.   hydrOXYzine 25 MG tablet Commonly known as: ATARAX/VISTARIL Take 1 tablet (25 mg total) by mouth every 6 (six) hours as needed for anxiety (or CIWA score </= 10).   lidocaine 5 % Commonly known as: LIDODERM Place 2 patches onto the skin daily. Remove & Discard patch within 12 hours or as directed by MD   loratadine 10 MG tablet Commonly known as: CLARITIN Take 1 tablet (10 mg total) by mouth daily. Start taking on: December 03, 2018   mometasone-formoterol 100-5 MCG/ACT Aero Commonly known as: DULERA Inhale 2 puffs into the lungs 2 (two) times daily.   multivitamin with minerals Tabs tablet Take 1 tablet by mouth daily. Start taking on: December 03, 2018   nicotine 14 mg/24hr patch Commonly known as: NICODERM CQ - dosed in mg/24 hours Place 1 patch (14 mg total) onto the skin daily. Start taking on: December 03, 2018   OLANZapine 2.5 MG tablet Commonly known as: ZYPREXA Take 2 tablets (5 mg total) by mouth at bedtime.   OLANZapine 5 MG tablet Commonly known as: ZYPREXA Take 0.5 tablets (2.5 mg  total) by mouth daily as needed (agitation).   pantoprazole 40 MG tablet Commonly known as: PROTONIX Take 1 tablet (40 mg total) by mouth daily at 6 (six) AM. Start taking on: December 03, 2018   polyethylene glycol 17 g packet Commonly known as: MIRALAX / GLYCOLAX Take 17 g by mouth daily as needed for moderate constipation or severe constipation.   predniSONE 20 MG tablet  Commonly known as: DELTASONE Take 1-2 tablets (20-40 mg total) by mouth daily with breakfast. Take 2 tablets (40mg ) daily x3 days, then 1 tablet (20mg ) daily x3 days then stop Start taking on: December 03, 2018   Spiriva HandiHaler 18 MCG inhalation capsule Generic drug: tiotropium Place 1 capsule (18 mcg total) into inhaler and inhale daily for 30 days.   thiamine 100 MG tablet Take 1 tablet (100 mg total) by mouth daily. Start taking on: December 03, 2018   traMADol 50 MG tablet Commonly known as: ULTRAM Take 1 tablet (50 mg total) by mouth every 6 (six) hours as needed.   vitamin C 500 MG tablet Commonly known as: ASCORBIC ACID Take 500 mg by mouth daily.            Durable Medical Equipment  (From admission, onward)         Start     Ordered   12/02/18 1404  For home use only DME 3 n 1  Once     12/02/18 1405         Allergies  Allergen Reactions  . Penicillins Anaphylaxis    Did it involve swelling of the face/tongue/throat, SOB, or low BP? Yes Did it involve sudden or severe rash/hives, skin peeling, or any reaction on the inside of your mouth or nose? No Did you need to seek medical attention at a hospital or doctor's office? No When did it last happen?childhood If all above answers are "NO", may proceed with cephalosporin use.    Follow-up Information    [facility] Follow up.   Specialty: Home Health Services Why: agency will provide home health physical therapy, occupational therapy, aide, and social worker. agency will call you to set up initial visit.  Contact  information: [address] [place] [address] [phone]        PCP. Schedule an appointment as soon as possible for a visit in 1 week(s).   Why: F/U IN 1-2 WEEKS.       [place] Office. Schedule an appointment as soon as possible for a visit in 1 week(s).   Specialty: Cardiology Why: Office will call with appointment time for follow-up on abnormal 2D echo. Contact information: [address] [place] [phone]       Neurosurgery Follow up.   Why: Follow-up as previously scheduled.           The results of significant diagnostics from this hospitalization (including imaging, microbiology, ancillary and laboratory) are listed below for reference.    Significant Diagnostic Studies: Dg Chest 2 View  Result Date: 12/01/2018 CLINICAL DATA:  Shortness of breath, worsening over the past few days, wheezing and lower LEFT field. Rib fractures from fall last week. EXAM: CHEST - 2 VIEW COMPARISON:  Chest x-ray dated 08/18/2018. Thoracic spine x-ray dated 11/23/2018. FINDINGS: Heart size and mediastinal contours are within normal limits. Median sternotomy wires in place, for presumed CABG. Mild scarring/fibrosis at the RIGHT lung base, stable. Lungs otherwise clear. No pleural effusion or pneumothorax seen. Compression fracture deformities of 3 contiguous vertebral bodies in the midthoracic spine, compatible with MRI findings on 11/23/2018 demonstrating likely subacute T6, T7 and T8 compression fractures. No appreciable change in the short-term interval. No new/acute osseous abnormality. Old displaced rib fractures on the RIGHT. No acute appearing rib fracture or displacement appreciated. IMPRESSION: 1. No active cardiopulmonary disease. No evidence of pneumonia or pulmonary edema. 2. Compression  fracture deformities of 3 contiguous vertebral bodies in the midthoracic spine, compatible with MRI findings on 11/23/2018  demonstrating likely subacute T6, T7 and T8 compression fractures. No acute appearing rib fracture or displacement seen. Electronically Signed   By: Franki Cabot M.D.   On: 12/01/2018 05:08   Dg Chest 2 View  Result Date: 11/23/2018 CLINICAL DATA:  Fall. EXAM: CHEST - 2 VIEW COMPARISON:  CT of the chest 08/19/2018 FINDINGS: The heart size is normal. Healing right-sided rib fractures are noted. Right lateral airspace opacities are not noted. Left lung is clear. Superior endplate fractures at T7-D2 are stable. The inferior endplate fracture at T9 is new. IMPRESSION: 1. New inferior endplate fracture at T9. 2. Healing right-sided rib fractures. 3. Peripheral airspace opacity at the right base is improving. Electronically Signed   By: San Morelle M.D.   On: 11/23/2018 05:17   Dg Thoracic Spine W/swimmers  Result Date: 11/23/2018 CLINICAL DATA:  Bilateral leg weakness and back pain over the last few weeks. Fall. EXAM: THORACIC SPINE - 3 VIEWS COMPARISON:  CT of the chest 08/19/2018 FINDINGS: Superior endplate fractures at T7 and T8 are stable. The inferior endplate fracture at T9 is new. Vertebral body heights are otherwise maintained. IMPRESSION: New inferior endplate fracture at T9. Electronically Signed   By: San Morelle M.D.   On: 11/23/2018 05:13   Dg Lumbar Spine Complete  Result Date: 11/23/2018 CLINICAL DATA:  Fall.  Back pain. EXAM: LUMBAR SPINE - COMPLETE 4+ VIEW COMPARISON:  CT of the abdomen and pelvis 08/19/2018 FINDINGS: 5 non rib-bearing lumbar type vertebral bodies are present. Sclerotic degenerative changes are noted at L4-5. Vertebral body heights are maintained. No acute fracture or subluxation is present. Atherosclerotic changes are noted in the aorta. There is focal ectasia just above the bifurcation. IMPRESSION: 1. No acute abnormality. 2. Stable degenerative changes, most evident at L4-5. 3. Focal ectasia of the abdominal aorta. Electronically Signed   By: San Morelle M.D.   On: 11/23/2018 05:16   Ct Head Wo Contrast  Result Date: 11/23/2018 CLINICAL DATA:  Fall.  Bilateral leg weakness. EXAM: CT HEAD WITHOUT CONTRAST TECHNIQUE: Contiguous axial images were obtained from the base of the skull through the vertex without intravenous contrast. COMPARISON:  CT head without contrast 08/19/2018 FINDINGS: Brain: Remote lacunar infarct involving the right basal ganglia is stable. Remote lacunar infarct in the right thalamus is stable. Moderate atrophy and white matter disease is unchanged. No acute infarct, hemorrhage, or mass lesion is present. The ventricles are proportionate to the degree of atrophy. No significant extraaxial fluid collection is present. Vascular: Atherosclerotic calcifications are present within the cavernous internal carotid arteries. There is no hyperdense vessel. Skull: Calvarium is intact. No focal lytic or blastic lesions are present. No significant extracranial injury is evident. Sinuses/Orbits: Chronic circumferential mucosal thickening is present the left maxillary sinus. There is asymmetric wall thickening of the left maxillary sinus. Fluid is also present in the left maxillary sinus. Ethmoid air cells are clear. A polyp or mucous retention cyst is present laterally in the left sphenoid sinus. Mastoid air cells are clear. Globes and orbits are within normal limits. IMPRESSION: 1. No acute intracranial abnormality or significant interval change. 2. Stable atrophy and white matter disease. 3. Remote lacunar infarcts involving the right basal ganglia are stable. 4. Acute on chronic left maxillary sinus disease. 5. Atherosclerosis Electronically Signed   By: San Morelle M.D.   On: 11/23/2018 05:06   Ct Angio Chest Pe  W Or Wo Contrast  Result Date: 12/01/2018 CLINICAL DATA:  Chest pain, cough, shortness of breath, weight loss EXAM: CT ANGIOGRAPHY CHEST WITH CONTRAST TECHNIQUE: Multidetector CT imaging of the chest was performed using the  standard protocol during bolus administration of intravenous contrast. Multiplanar CT image reconstructions and MIPs were obtained to evaluate the vascular anatomy. CONTRAST:  188mL OMNIPAQUE IOHEXOL 350 MG/ML SOLN COMPARISON:  MRI thoracic spine, 11/23/2018 FINDINGS: Cardiovascular: Satisfactory opacification of the pulmonary arteries to the segmental level. No evidence of pulmonary embolism. Aortic atherosclerosis. Cardiomegaly and three-vessel coronary artery calcifications status post CABG. No pericardial effusion. Mediastinum/Nodes: No enlarged mediastinal, hilar, or axillary lymph nodes. Thyroid gland, trachea, and esophagus demonstrate no significant findings. Lungs/Pleura: Bibasilar atelectasis or scarring. No pleural effusion or pneumothorax. Upper Abdomen: No acute abnormality. Musculoskeletal: No chest wall abnormality. No acute or significant osseous findings. Redemonstrated wedge deformities of T6, T7, and T8. Multiple nonacute fracture deformities of the ribs. Review of the MIP images confirms the above findings. IMPRESSION: 1.  Negative examination for pulmonary embolism. 2.  Coronary artery disease and aortic atherosclerosis. Electronically Signed   By: Eddie Candle M.D.   On: 12/01/2018 16:11   Mr Thoracic Spine Wo Contrast  Result Date: 11/23/2018 CLINICAL DATA:  Recent falls with worsening bilateral lower extremity weakness and sensation changes. Thoracic compression fractures. EXAM: MRI THORACIC AND LUMBAR SPINE WITHOUT CONTRAST TECHNIQUE: Multiplanar and multiecho pulse sequences of the thoracic and lumbar spine were obtained without intravenous contrast. COMPARISON:  Thoracic and lumbar spine radiographs 11/23/2018. CT chest, abdomen, and pelvis 08/19/2018. FINDINGS: MRI THORACIC SPINE FINDINGS The study is motion degraded throughout including severe motion on the sagittal STIR and axial spin echo T2 sequences. Alignment: Normal. Vertebrae: T6, T7, and T8 compression fractures all  demonstrate progressive vertebral body height loss compared to the 08/19/2018 CT with height loss at each level being between 30 and 40%. Fracture lines remain visible at each level, and there is at least mild marrow edema at each level with assessment limited by motion artifact on the STIR sequence. There is no associated retropulsion. Multiple small Schmorl's nodes are noted. Subtle anterior wedging of the T11 and T12 vertebral bodies is unchanged from the prior CT and chronic in appearance. No suspicious marrow lesion is identified. Cord:  Normal cord signal and morphology. Paraspinal and other soft tissues: Unremarkable. Disc levels: Mild facet arthrosis and up to mild disc bulging in the mid and lower thoracic spine without evidence of significant stenosis. Multilevel disc degeneration and advanced facet arthrosis in the cervical spine are incompletely evaluated. MRI LUMBAR SPINE FINDINGS The study is motion degraded throughout with severe motion on axial sequences. Segmentation: Standard. Alignment: Mild lower lumbar dextroscoliosis with apex at L4. No significant listhesis. Vertebrae: No fracture or suspicious osseous lesion. Severe disc space narrowing asymmetric to the right at L4-5 with T2/STIR hyperintensity in the disc space which is likely degenerative given evidence of vacuum disc and prominent Modic type 3 degenerative endplate marrow changes. Moderate disc space narrowing and milder chronic degenerative endplate changes at Y3-0 and L3-4. Disc desiccation throughout the lumbar spine with exception of L1-2. Mild right-sided facet edema from L3-S1. Conus medullaris and cauda equina: Conus extends to the T12 level. Conus and cauda equina appear normal. Paraspinal and other soft tissues: Unremarkable. Disc levels: L1-2: Normal disc.  Mild facet hypertrophy without stenosis. L2-3: Circumferential disc bulging and mild facet hypertrophy result in mild spinal stenosis and mild-to-moderate left lateral recess  stenosis without neural foraminal stenosis.  L3-4: Circumferential disc bulging greater to the left, moderate facet and ligamentum flavum hypertrophy, a 2 cm synovial cyst medial to the right facet joint, congenitally short pedicles result in severe spinal stenosis and right greater than left lateral recess stenosis with bilateral neural impingement likely at this level, particularly of the right L4 nerve root. Patent neural foramina. L4-5: Circumferential disc bulging greater to the right, asymmetric right-sided disc space height loss, moderate facet and ligamentum flavum hypertrophy, and congenitally short pedicles result in moderate to severe spinal stenosis, moderate bilateral lateral recess stenosis, and severe right and mild left neural foraminal stenosis. Potential right L4 and bilateral L5 nerve root impingement. L5-S1: Mild disc bulging, a shallow right subarticular disc protrusion with annular fissure, a small right foraminal disc protrusion, and moderate right and mild left facet and ligamentum flavum hypertrophy result in mild right lateral recess and mild right neural foraminal stenosis without spinal stenosis. IMPRESSION: MR THORACIC SPINE IMPRESSION 1. Likely subacute T6, T7, and T8 compression fractures, all demonstrating mildly progressive vertebral body height loss since 08/19/2018 with mild mild associated marrow edema. 2. Mild thoracic spondylosis and facet arthrosis without stenosis. MR LUMBAR SPINE IMPRESSION 1. Multilevel lumbar disc and facet degeneration, most notable at L3-4 where there is severe spinal stenosis with a 2 cm synovial cyst contributing. 2. Moderate to severe spinal stenosis and severe right neural foraminal stenosis at L4-5. 3. Mild spinal stenosis at L2-3. Electronically Signed   By: Logan Bores M.D.   On: 11/23/2018 11:58   Mr Lumbar Spine Wo Contrast  Result Date: 11/23/2018 CLINICAL DATA:  Recent falls with worsening bilateral lower extremity weakness and sensation  changes. Thoracic compression fractures. EXAM: MRI THORACIC AND LUMBAR SPINE WITHOUT CONTRAST TECHNIQUE: Multiplanar and multiecho pulse sequences of the thoracic and lumbar spine were obtained without intravenous contrast. COMPARISON:  Thoracic and lumbar spine radiographs 11/23/2018. CT chest, abdomen, and pelvis 08/19/2018. FINDINGS: MRI THORACIC SPINE FINDINGS The study is motion degraded throughout including severe motion on the sagittal STIR and axial spin echo T2 sequences. Alignment: Normal. Vertebrae: T6, T7, and T8 compression fractures all demonstrate progressive vertebral body height loss compared to the 08/19/2018 CT with height loss at each level being between 30 and 40%. Fracture lines remain visible at each level, and there is at least mild marrow edema at each level with assessment limited by motion artifact on the STIR sequence. There is no associated retropulsion. Multiple small Schmorl's nodes are noted. Subtle anterior wedging of the T11 and T12 vertebral bodies is unchanged from the prior CT and chronic in appearance. No suspicious marrow lesion is identified. Cord:  Normal cord signal and morphology. Paraspinal and other soft tissues: Unremarkable. Disc levels: Mild facet arthrosis and up to mild disc bulging in the mid and lower thoracic spine without evidence of significant stenosis. Multilevel disc degeneration and advanced facet arthrosis in the cervical spine are incompletely evaluated. MRI LUMBAR SPINE FINDINGS The study is motion degraded throughout with severe motion on axial sequences. Segmentation: Standard. Alignment: Mild lower lumbar dextroscoliosis with apex at L4. No significant listhesis. Vertebrae: No fracture or suspicious osseous lesion. Severe disc space narrowing asymmetric to the right at L4-5 with T2/STIR hyperintensity in the disc space which is likely degenerative given evidence of vacuum disc and prominent Modic type 3 degenerative endplate marrow changes. Moderate  disc space narrowing and milder chronic degenerative endplate changes at Q0-0 and L3-4. Disc desiccation throughout the lumbar spine with exception of L1-2. Mild right-sided facet edema from  L3-S1. Conus medullaris and cauda equina: Conus extends to the T12 level. Conus and cauda equina appear normal. Paraspinal and other soft tissues: Unremarkable. Disc levels: L1-2: Normal disc.  Mild facet hypertrophy without stenosis. L2-3: Circumferential disc bulging and mild facet hypertrophy result in mild spinal stenosis and mild-to-moderate left lateral recess stenosis without neural foraminal stenosis. L3-4: Circumferential disc bulging greater to the left, moderate facet and ligamentum flavum hypertrophy, a 2 cm synovial cyst medial to the right facet joint, congenitally short pedicles result in severe spinal stenosis and right greater than left lateral recess stenosis with bilateral neural impingement likely at this level, particularly of the right L4 nerve root. Patent neural foramina. L4-5: Circumferential disc bulging greater to the right, asymmetric right-sided disc space height loss, moderate facet and ligamentum flavum hypertrophy, and congenitally short pedicles result in moderate to severe spinal stenosis, moderate bilateral lateral recess stenosis, and severe right and mild left neural foraminal stenosis. Potential right L4 and bilateral L5 nerve root impingement. L5-S1: Mild disc bulging, a shallow right subarticular disc protrusion with annular fissure, a small right foraminal disc protrusion, and moderate right and mild left facet and ligamentum flavum hypertrophy result in mild right lateral recess and mild right neural foraminal stenosis without spinal stenosis. IMPRESSION: MR THORACIC SPINE IMPRESSION 1. Likely subacute T6, T7, and T8 compression fractures, all demonstrating mildly progressive vertebral body height loss since 08/19/2018 with mild mild associated marrow edema. 2. Mild thoracic spondylosis  and facet arthrosis without stenosis. MR LUMBAR SPINE IMPRESSION 1. Multilevel lumbar disc and facet degeneration, most notable at L3-4 where there is severe spinal stenosis with a 2 cm synovial cyst contributing. 2. Moderate to severe spinal stenosis and severe right neural foraminal stenosis at L4-5. 3. Mild spinal stenosis at L2-3. Electronically Signed   By: Logan Bores M.D.   On: 11/23/2018 11:58    Microbiology: Recent Results (from the past 240 hour(s))  Urine culture     Status: Abnormal   Collection Time: 11/23/18  6:30 AM   Specimen: Urine, Random  Result Value Ref Range Status   Specimen Description URINE, RANDOM  Final   Special Requests   Final    NONE Performed at [facility], [address] [place], [place] [address]    Culture >=100,000 COLONIES/mL KLEBSIELLA PNEUMONIAE (A)  Final   Report Status 11/25/2018 FINAL  Final   Organism ID, Bacteria KLEBSIELLA PNEUMONIAE (A)  Final      Susceptibility   Klebsiella pneumoniae - MIC*    AMPICILLIN >=32 RESISTANT Resistant     CEFAZOLIN <=4 SENSITIVE Sensitive     CEFTRIAXONE <=1 SENSITIVE Sensitive     CIPROFLOXACIN <=0.25 SENSITIVE Sensitive     GENTAMICIN <=1 SENSITIVE Sensitive     IMIPENEM <=0.25 SENSITIVE Sensitive     NITROFURANTOIN 64 INTERMEDIATE Intermediate     TRIMETH/SULFA <=20 SENSITIVE Sensitive     AMPICILLIN/SULBACTAM >=32 RESISTANT Resistant     PIP/TAZO <=4 SENSITIVE Sensitive     Extended ESBL NEGATIVE Sensitive     * >=100,000 COLONIES/mL KLEBSIELLA PNEUMONIAE  SARS Coronavirus 2 (CEPHEID- Performed in [facility]), Hosp Order     Status: None   Collection Time: 12/01/18  5:48 AM   Specimen: Nasopharyngeal Swab  Result Value Ref Range Status   SARS Coronavirus 2 NEGATIVE NEGATIVE Final    Comment: (NOTE) If result is NEGATIVE SARS-CoV-2 target nucleic acids are NOT DETECTED. The SARS-CoV-2 RNA is generally detectable in upper and lower  respiratory  specimens during the acute  phase of infection. The lowest  concentration of SARS-CoV-2 viral copies this assay can detect is 250  copies / mL. A negative result does not preclude SARS-CoV-2 infection  and should not be used as the sole basis for treatment or other  patient management decisions.  A negative result may occur with  improper specimen collection / handling, submission of specimen other  than nasopharyngeal swab, presence of viral mutation(s) within the  areas targeted by this assay, and inadequate number of viral copies  (<250 copies / mL). A negative result must be combined with clinical  observations, patient history, and epidemiological information. If result is POSITIVE SARS-CoV-2 target nucleic acids are DETECTED. The SARS-CoV-2 RNA is generally detectable in upper and lower  respiratory specimens dur ing the acute phase of infection.  Positive  results are indicative of active infection with SARS-CoV-2.  Clinical  correlation with patient history and other diagnostic information is  necessary to determine patient infection status.  Positive results do  not rule out bacterial infection or co-infection with other viruses. If result is PRESUMPTIVE POSTIVE SARS-CoV-2 nucleic acids MAY BE PRESENT.   A presumptive positive result was obtained on the submitted specimen  and confirmed on repeat testing.  While 2019 novel coronavirus  (SARS-CoV-2) nucleic acids may be present in the submitted sample  additional confirmatory testing may be necessary for epidemiological  and / or clinical management purposes  to differentiate between  SARS-CoV-2 and other Sarbecovirus currently known to infect humans.  If clinically indicated additional testing with an alternate test  methodology [phone]) is advised. The SARS-CoV-2 RNA is generally  detectable in upper and lower respiratory sp ecimens during the acute  phase of infection. The expected result is Negative. Fact Sheet for Patients:   StrictlyIdeas.no Fact Sheet for Healthcare Providers: BankingDealers.co.za This test is not yet approved or cleared by the [place] FDA and has been authorized for detection and/or diagnosis of SARS-CoV-2 by FDA under an Emergency Use Authorization (EUA).  This EUA will remain in effect (meaning this test can be used) for the duration of the COVID-19 declaration under Section 564(b)(1) of the Act, 21 U.S.C. section 360bbb-3(b)(1), unless the authorization is terminated or revoked sooner. Performed at [facility], [place] [address], [place], [place] [address]   Respiratory Panel by PCR     Status: None   Collection Time: 12/01/18  8:50 AM   Specimen: Nasopharyngeal Swab; Respiratory  Result Value Ref Range Status   Adenovirus NOT DETECTED NOT DETECTED Final   Coronavirus 229E NOT DETECTED NOT DETECTED Final    Comment: (NOTE) The Coronavirus on the Respiratory Panel, DOES NOT test for the novel  Coronavirus (2019 nCoV)    Coronavirus HKU1 NOT DETECTED NOT DETECTED Final   Coronavirus NL63 NOT DETECTED NOT DETECTED Final   Coronavirus OC43 NOT DETECTED NOT DETECTED Final   Metapneumovirus NOT DETECTED NOT DETECTED Final   Rhinovirus / Enterovirus NOT DETECTED NOT DETECTED Final   Influenza A NOT DETECTED NOT DETECTED Final   Influenza B NOT DETECTED NOT DETECTED Final   Parainfluenza Virus 1 NOT DETECTED NOT DETECTED Final   Parainfluenza Virus 2 NOT DETECTED NOT DETECTED Final   Parainfluenza Virus 3 NOT DETECTED NOT DETECTED Final   Parainfluenza Virus 4 NOT DETECTED NOT DETECTED Final   Respiratory Syncytial Virus NOT DETECTED NOT DETECTED Final   Bordetella pertussis NOT DETECTED NOT DETECTED Final   Chlamydophila pneumoniae NOT DETECTED NOT DETECTED Final  Mycoplasma pneumoniae NOT DETECTED NOT DETECTED Final    Comment: Performed at [facility], [place] [address] [place], [place] [address]      Labs: Basic Metabolic Panel: Recent Labs  Lab 12/01/18 0548 12/01/18 1120 12/02/18 0525  NA 134*  --  138  K 3.5  --  3.4*  CL 98  --  108  CO2 22  --  20*  GLUCOSE 88  --  151*  BUN <5*  --  10  CREATININE 0.57*  --  0.60*  CALCIUM 8.3*  --  8.1*  MG  --  2.1  --    Liver Function Tests: Recent Labs  Lab 12/02/18 0525  AST 18  ALT 16  ALKPHOS 107  BILITOT 0.3  PROT 5.3*  ALBUMIN 2.6*   No results for input(s): LIPASE, AMYLASE in the last 168 hours. No results for input(s): AMMONIA in the last 168 hours. CBC: Recent Labs  Lab 12/01/18 0548 12/02/18 0525  WBC 7.7 10.5  NEUTROABS 4.7  --   HGB 14.4 11.9*  HCT 42.5 36.3*  MCV 106.3* 108.0*  PLT 335 321   Cardiac Enzymes: No results for input(s): CKTOTAL, CKMB, CKMBINDEX, TROPONINI in the last 168 hours. BNP: BNP (last 3 results) Recent Labs    08/19/18 0415 12/01/18 0548  BNP 172.4* 286.1*    ProBNP (last 3 results) No results for input(s): PROBNP in the last 8760 hours.  CBG: No results for input(s): GLUCAP in the last 168 hours.     Signed:  Irine Seal MD.  [facility] 12/02/2018, 2:59 PM

## 2018-12-02 NOTE — Progress Notes (Signed)
Pt to be discharged to home this afternoon. Pt's Daughter via phone Jair Lindblad  Advised of pending discharge. Discharge instructions including review of all Medications and schedules discussed with Pt's Daughter. Daughter verbalized understanding of all discharge teaching/instructions. Discharge Packet with Prescriptions with Pt at time of discharge.

## 2018-12-02 NOTE — Progress Notes (Signed)
Initial Nutrition Assessment  DOCUMENTATION CODES:   Underweight  INTERVENTION:   Provide Ensure Enlive po BID, each supplement provides 350 kcal and 20 grams of protein  NUTRITION DIAGNOSIS:   Increased nutrient needs related to chronic illness as evidenced by estimated needs.  GOAL:   Patient will meet greater than or equal to 90% of their needs  MONITOR:   PO intake, Supplement acceptance, Labs, Weight trends, I & O's  REASON FOR ASSESSMENT:   Consult COPD Protocol  ASSESSMENT:   68 y.o. male with medical history significant of alcohol abuse, anxiety, CAD status post CABG and stents in the past, nicotine abuse and stroke presented to ER with a complaint of chest pain and shortness of breath.  **RD working remotely**  Patient currently consuming 25-50% of meals. Pt with history of ETOH abuse, family reports pt continues to drink daily. Patient would benefit from protein supplements, will order Ensure supplements.   Per weight records, pt has lost 9 lbs since 3/17 (6% wt loss x 3.5 months, insignificant for time frame).   Medications: Folic acid tablet daily, Multivitamin with minerals daily, K-DUR tablet once, Thiamine tablet daily, Vitamin B-12 tablet daily Labs reviewed:  Low K  NUTRITION - FOCUSED PHYSICAL EXAM:  Unable to perform -working remotely.  Diet Order:   Diet Order            Diet Heart Room service appropriate? Yes; Fluid consistency: Thin  Diet effective now              EDUCATION NEEDS:   No education needs have been identified at this time  Skin:  Skin Assessment: Reviewed RN Assessment  Last BM:  6/27  Height:   Ht Readings from Last 1 Encounters:  12/01/18 6\' 2"  (1.88 m)    Weight:   Wt Readings from Last 1 Encounters:  12/02/18 63.8 kg    Ideal Body Weight:  86.3 kg  BMI:  Body mass index is 18.06 kg/m.  Estimated Nutritional Needs:   Kcal:  1900-2100  Protein:  95-105g  Fluid:  2L/day  Clayton Bibles, MS,  RD, LDN [place] Dietitian Pager: [phone] After Hours Pager: [phone]

## 2018-12-02 NOTE — Evaluation (Signed)
Occupational Therapy Evaluation Patient Details Name: [name] Pace MRN: [id number] DOB: 1950-10-11 Today's Date: 12/02/2018    History of Present Illness Pt admitted with SOB, chest pain and recent falls.  Pt with hx of CVA, CAD, CABG, cocaine and ETOH abuse, lumbar spinal stenosis, and sub-acute T6-7-8 compression fxs   Clinical Impression   PTA, pt was independent with RW for ADL and functional mobility. He currently is unstable and presents with decreased activity tolerance for ADL and functional mobility. He requires min guard assist for functional toilet transfers and LB ADL tasks. He is currently unsafe to complete step over into tub and thus recommending tub bench (discussed with case manager). Pt would benefit from continued OT services while admitted to maximize return to modified independent level. Recommend HHOT follow-up.     Follow Up Recommendations  Home health OT;Supervision/Assistance - 24 hour    Equipment Recommendations  3 in 1 bedside commode;Tub/shower bench(Requires tub bench; unable to step over tub)    Recommendations for Other Services       Precautions / Restrictions Precautions Precautions: Fall;Back Precaution Comments: subacute T6-7-8 compression fractior Restrictions Weight Bearing Restrictions: No      Mobility Bed Mobility Overal bed mobility: Modified Independent             General bed mobility comments: No physical assist required; pt with difficulty adhering to back precautions  Transfers Overall transfer level: Needs assistance Equipment used: Rolling walker (2 wheeled) Transfers: Sit to/from Stand Sit to Stand: Min guard;Supervision         General transfer comment: for safety 2* pt impulsive nature    Balance Overall balance assessment: Needs assistance Sitting-balance support: No upper extremity supported;Feet supported Sitting balance-Leahy Scale: Good     Standing balance support: No upper extremity  supported Standing balance-Leahy Scale: Fair                             ADL either performed or assessed with clinical judgement   ADL Overall ADL's : Needs assistance/impaired Eating/Feeding: Set up;Sitting   Grooming: Supervision/safety;Standing   Upper Body Bathing: Set up;Sitting   Lower Body Bathing: Sit to/from stand;Min guard   Upper Body Dressing : Set up;Sitting   Lower Body Dressing: Min guard;Sit to/from stand   Toilet Transfer: Min Marine scientist Details (indicate cue type and reason): decreased activity tolerance Toileting- Clothing Manipulation and Hygiene: Min guard;Sit to/from stand       Functional mobility during ADLs: Min guard;Rolling walker       Vision Baseline Vision/History: Wears glasses;Cataracts Wears Glasses: At all times Patient Visual Report: No change from baseline Vision Assessment?: No apparent visual deficits     Perception     Praxis      Pertinent Vitals/Pain Pain Assessment: 0-10 Pain Score: 6  Pain Location: R hip and groin Pain Descriptors / Indicators: Aching;Sore Pain Intervention(s): Limited activity within patient's tolerance;Monitored during session     Hand Dominance Right   Extremity/Trunk Assessment Upper Extremity Assessment Upper Extremity Assessment: Defer to OT evaluation   Lower Extremity Assessment Lower Extremity Assessment: Generalized weakness   Cervical / Trunk Assessment Cervical / Trunk Assessment: Kyphotic   Communication Communication Communication: No difficulties   Cognition Arousal/Alertness: Awake/alert Behavior During Therapy: WFL for tasks assessed/performed;Impulsive Overall Cognitive Status: Within Functional Limits for tasks assessed  General Comments  Pt with baseline decreased safety awareness; will need 24/7 assist at home    Exercises     Shoulder Instructions      Home Living  Family/patient expects to be discharged to:: Private residence Living Arrangements: Spouse/significant other Available Help at Discharge: Friend(s);Available 24 hours/day Type of Home: Apartment Home Access: Stairs to enter [employer] of Steps: 2 flights Entrance Stairs-Rails: Right;Left;Can reach both Home Layout: One level     Bathroom Shower/Tub: Teacher, early years/pre: Standard     Home Equipment: Environmental consultant - 2 wheels   Additional Comments: pt was recently hospitalized and d/c'd to [facility] in which he left AMA and went back to his apartment with his girlfriend and fell multiple times. Per chart pt may be IVC upon d/c.       Prior Functioning/Environment Level of Independence: Independent with assistive device(s)        Comments: Pt states using RW most of the time 2* hip and back pain        OT Problem List: Decreased strength;Decreased range of motion;Decreased activity tolerance;Impaired balance (sitting and/or standing);Decreased safety awareness;Decreased knowledge of use of DME or AE;Decreased knowledge of precautions;Pain      OT Treatment/Interventions: Self-care/ADL training;Therapeutic exercise;Energy conservation;DME and/or AE instruction;Therapeutic activities;Patient/family education;Balance training    OT Goals(Current goals can be found in the care plan section) Acute Rehab OT Goals Patient Stated Goal: to go home ASAP OT Goal Formulation: With patient Time For Goal Achievement: 12/16/18 Potential to Achieve Goals: Good ADL Goals Pt Will Perform Grooming: Independently;standing Pt Will Perform Lower Body Dressing: with modified independence;sit to/from stand Pt Will Transfer to Toilet: with modified independence;ambulating;regular height toilet;bedside commode(BSC over toilet) Pt Will Perform Toileting - Clothing Manipulation and hygiene: with modified independence;sit to/from stand Pt Will Perform Tub/Shower Transfer: with  modified independence;tub bench;rolling walker;Tub transfer Additional ADL Goal #1: Pt will verbalize 3/3 back precautions to improve safety and comfort during ADL.  OT Frequency: Min 2X/week   Barriers to D/C:            Co-evaluation              AM-PAC OT "6 Clicks" Daily Activity     Outcome Measure Help from another person eating meals?: None Help from another person taking care of personal grooming?: None Help from another person toileting, which includes using toliet, bedpan, or urinal?: A Little Help from another person bathing (including washing, rinsing, drying)?: A Little Help from another person to put on and taking off regular upper body clothing?: None Help from another person to put on and taking off regular lower body clothing?: A Little 6 Click Score: 21   End of Session Equipment Utilized During Treatment: Rolling walker Nurse Communication: Scientist, product/process development, equipment recommendations)  Activity Tolerance: Patient tolerated treatment well Patient left: in chair;with call bell/phone within reach  OT Visit Diagnosis: Other abnormalities of gait and mobility (R26.89);Pain Pain - Right/Left: Right(back) Pain - part of body: Hip(back)                Time: 5409-8119 OT Time Calculation (min): 18 min Charges:  OT General Charges $OT Visit: 1 Visit OT Evaluation $OT Eval Moderate Complexity: [place] A Peregrine Nolt 12/02/2018, 2:42 PM

## 2018-12-02 NOTE — Progress Notes (Signed)
After admission to this unit this RN asked the patient when his last alcohol drink was and this patient stated "Friday". This RN also notified the [name]) who is very helpful and very knowledgeable about her fathers history and daily consumption of alcohol, she is very concerned about him and would really like a neurosurgery consult. She states once he works the system to getting out of the hospital he never follows up with his doctors visits or any care for that matter. I notified the MD of this and CIWA protocol was placed. Patient is very cooperative and calm. Will continue to monitor patient.

## 2018-12-02 NOTE — Evaluation (Signed)
Physical Therapy Evaluation Patient Details Name: [name] Pace MRN: [id number] DOB: Feb 08, 1951 Today's Date: 12/02/2018   History of Present Illness  Pt admitted with SOB, chest pain and recent falls.  Pt with hx of CVA, CAD, CABG, cocaine and ETOH abuse, lumbar spinal stenosis, and sub-acute T6-7-8 compression fxs  Clinical Impression  Pt admitted as above and presenting with functional mobility limitations 2* generalized weakness, ambulatory balance deficits and ongoing back and hip pain.  Pt would benefit from follow up HHPT to maximize IND and safety at home.    Follow Up Recommendations Home health PT    Equipment Recommendations  None recommended by PT    Recommendations for Other Services OT consult     Precautions / Restrictions Precautions Precautions: Fall Restrictions Weight Bearing Restrictions: No      Mobility  Bed Mobility Overal bed mobility: Modified Independent             General bed mobility comments: No physical assist required  Transfers Overall transfer level: Needs assistance Equipment used: Rolling walker (2 wheeled) Transfers: Sit to/from Stand Sit to Stand: Min guard;Supervision         General transfer comment: for safety 2* pt impulsive nature  Ambulation/Gait Ambulation/Gait assistance: Min guard;Supervision Gait Distance (Feet): 140 Feet Assistive device: Rolling walker (2 wheeled) Gait Pattern/deviations: Step-to pattern;Step-through pattern;Decreased step length - right;Decreased step length - left;Shuffle;Wide base of support Gait velocity: decr   General Gait Details: cues for posture and position from RW; one extended standing rest break at 70' 2* SOB  Stairs            Wheelchair Mobility    Modified Rankin (Stroke Patients Only)       Balance Overall balance assessment: Needs assistance Sitting-balance support: No upper extremity supported;Feet supported Sitting balance-Leahy Scale: Good     Standing  balance support: No upper extremity supported Standing balance-Leahy Scale: Fair                               Pertinent Vitals/Pain Pain Assessment: 0-10 Pain Score: 6  Pain Location: R hip and groin Pain Descriptors / Indicators: Aching;Sore Pain Intervention(s): Limited activity within patient's tolerance;Monitored during session;Patient requesting pain meds-RN notified    Home Living Family/patient expects to be discharged to:: Private residence Living Arrangements: Spouse/significant other Available Help at Discharge: Friend(s);Available 24 hours/day Type of Home: Apartment Home Access: Stairs to enter Entrance Stairs-Rails: Right;Left;Can reach both Entrance Stairs-Number of Steps: 2 flights Home Layout: One level Home Equipment: Walker - 2 wheels Additional Comments: pt was recently hospitalized and d/c'd to [facility] in which he left AMA and went back to his apartment with his girlfriend and fell multiple times. Per chart pt may be IVC upon d/c.     Prior Function Level of Independence: Independent with assistive device(s)         Comments: Pt states using RW most of the time 2* hip and back pain     Hand Dominance   Dominant Hand: Right    Extremity/Trunk Assessment   Upper Extremity Assessment Upper Extremity Assessment: Defer to OT evaluation    Lower Extremity Assessment Lower Extremity Assessment: Generalized weakness    Cervical / Trunk Assessment Cervical / Trunk Assessment: Kyphotic  Communication   Communication: No difficulties  Cognition Arousal/Alertness: Awake/alert Behavior During Therapy: WFL for tasks assessed/performed;Impulsive Overall Cognitive Status: Within Functional Limits for tasks assessed  General Comments      Exercises     Assessment/Plan    PT Assessment Patient needs continued PT services  PT Problem List Decreased strength;Decreased activity  tolerance;Decreased balance;Decreased mobility;Pain;Decreased knowledge of use of DME       PT Treatment Interventions DME instruction;Gait training;Stair training;Functional mobility training;Therapeutic activities;Therapeutic exercise;Patient/family education    PT Goals (Current goals can be found in the Care Plan section)  Acute Rehab PT Goals Patient Stated Goal: HOME PT Goal Formulation: With patient Time For Goal Achievement: 12/15/18 Potential to Achieve Goals: Good    Frequency Min 3X/week   Barriers to discharge        Co-evaluation               AM-PAC PT "6 Clicks" Mobility  Outcome Measure Help needed turning from your back to your side while in a flat bed without using bedrails?: None Help needed moving from lying on your back to sitting on the side of a flat bed without using bedrails?: None Help needed moving to and from a bed to a chair (including a wheelchair)?: A Little Help needed standing up from a chair using your arms (e.g., wheelchair or bedside chair)?: A Little Help needed to walk in hospital room?: A Little Help needed climbing 3-5 steps with a railing? : A Little 6 Click Score: 20    End of Session Equipment Utilized During Treatment: Gait belt Activity Tolerance: Patient tolerated treatment well;Patient limited by fatigue Patient left: in chair;with call bell/phone within reach;with chair alarm set Nurse Communication: Mobility status PT Visit Diagnosis: Difficulty in walking, not elsewhere classified (R26.2);History of falling (Z91.81)    Time: 2409-7353 PT Time Calculation (min) (ACUTE ONLY): 20 min   Charges:   PT Evaluation $PT Eval Low Complexity: 1 Low          [place] Pager [phone] Office [phone]   Fredricka Kohrs 12/02/2018, 1:34 PM

## 2018-12-02 NOTE — Progress Notes (Signed)
  Echocardiogram 2D Echocardiogram has been performed.  [name] Pace 12/02/2018, 10:51 AM

## 2018-12-02 NOTE — Plan of Care (Signed)
Pt had a good appetite this shift. CIWA scores were mainly tremors. Pt did C/O a headache, but 1 Mg of Ativan IV alleviated that. Pt calm and cooperative.

## 2018-12-03 NOTE — Progress Notes (Signed)
Post discharge follow-up note: Patient [facility]encion [facility]12[facility]18 at 1000.

## 2018-12-04 ENCOUNTER — Telehealth: Payer: Self-pay | Admitting: *Deleted

## 2018-12-04 NOTE — Telephone Encounter (Signed)
Left message on mobile number for pt to call back to review details for virtual visit with Dr. Harrington Challenger on 12/06/18. Home number - unable to reach or leave a message - was fast busy signal only.

## 2018-12-05 ENCOUNTER — Telehealth: Payer: Self-pay | Admitting: Internal Medicine

## 2018-12-05 NOTE — Telephone Encounter (Signed)
New Message ° ° ° °Left message to confirm appt and get consent  °

## 2018-12-05 NOTE — Telephone Encounter (Signed)
Reports had triple bypass years ago after mowing grass, pain between shoulder blades.  He thinks he was 89. Thinks his heart doctor was Dr. Gwenlyn Found.  Doesn't remember surgeon's name.  Telephone visit only.  No smartphone. [phone], use this number. Has no bp cuff at home.   Verbal consent given today.     Virtual Visit Pre-Appointment Phone Call  "(Name), I am calling you today to discuss your upcoming appointment. We are currently trying to limit exposure to the virus that causes COVID-19 by seeing patients at home rather than in the office."  1. "What is the BEST phone number to call the day of the visit?" - include this in appointment notes  2. "Do you have or have access to (through a family member/friend) a smartphone with video capability that we can use for your visit?" a. If yes - list this number in appt notes as "cell" (if different from BEST phone #) and list the appointment type as a VIDEO visit in appointment notes b. If no - list the appointment type as a PHONE visit in appointment notes  3. Confirm consent - "In the setting of the current Covid19 crisis, you are scheduled for a (phone or video) visit with your provider on (date) at (time).  Just as we do with many in-office visits, in order for you to participate in this visit, we must obtain consent.  If you'd like, I can send this to your mychart (if signed up) or email for you to review.  Otherwise, I can obtain your verbal consent now.  All virtual visits are billed to your insurance company just like a normal visit would be.  By agreeing to a virtual visit, we'd like you to understand that the technology does not allow for your provider to perform an examination, and thus may limit your provider's ability to fully assess your condition. If your provider identifies any concerns that need to be evaluated in person, we will make arrangements to do so.  Finally, though the technology is pretty good, we cannot assure that it will  always work on either your or our end, and in the setting of a video visit, we may have to convert it to a phone-only visit.  In either situation, we cannot ensure that we have a secure connection.  Are you willing to proceed?" STAFF: Did the patient verbally acknowledge consent to telehealth visit? Document YES/NO here: YES.  4. Advise patient to be prepared - "Two hours prior to your appointment, go ahead and check your blood pressure, pulse, oxygen saturation, and your weight (if you have the equipment to check those) and write them all down. When your visit starts, your provider will ask you for this information. If you have an Apple Watch or Kardia device, please plan to have heart rate information ready on the day of your appointment. Please have a pen and paper handy nearby the day of the visit as well."  5. Give patient instructions for MyChart download to smartphone OR Doximity/Doxy.me as below if video visit (depending on what platform provider is using)  6. Inform patient they will receive a phone call 15 minutes prior to their appointment time (may be from unknown caller ID) so they should be prepared to answer    TELEPHONE CALL NOTE  [name] Pace has been deemed a candidate for a follow-up tele-health visit to limit community exposure during the Covid-19 pandemic. I spoke with the patient via phone to ensure availability of  phone/video source, confirm preferred email & phone number, and discuss instructions and expectations.  I reminded JAVONTAE MARLETTE to be prepared with any vital sign and/or heart rhythm information that could potentially be obtained via home monitoring, at the time of his visit. I reminded EILAN MCINERNY to expect a phone call prior to his visit.  Rodman Key, RN 12/05/2018 1:28 PM   INSTRUCTIONS FOR DOWNLOADING THE MYCHART APP TO SMARTPHONE  - The patient must first make sure to have activated MyChart and know their login information - If Apple, go to [employer] and type in MyChart in the search bar and download the app. If Android, ask patient to go to [employer] and type in [place] in the search bar and download the app. The app is free but as with any other app downloads, their phone may require them to verify saved payment information or Apple/Android password.  - The patient will need to then log into the app with their MyChart username and password, and select [facility] as their healthcare provider to link the account. When it is time for your visit, go to the MyChart app, find appointments, and click Begin Video Visit. Be sure to Select Allow for your device to access the Microphone and Camera for your visit. You will then be connected, and your provider will be with you shortly.  **If they have any issues connecting, or need assistance please contact MyChart service desk [phone])**  **If using a computer, in order to ensure the best quality for their visit they will need to use either of the following Internet Browsers: [employer], or Google Chrome**  IF USING DOXIMITY or DOXY.ME - The patient will receive a link just prior to their visit by text.     FULL LENGTH CONSENT FOR TELE-HEALTH VISIT   I hereby voluntarily request, consent and authorize [facility] and its employed or contracted physicians, physician assistants, nurse practitioners or other licensed health care professionals (the Practitioner), to provide me with telemedicine health care services (the "Services") as deemed necessary by the treating Practitioner. I acknowledge and consent to receive the Services by the Practitioner via telemedicine. I understand that the telemedicine visit will involve communicating with the Practitioner through live audiovisual communication technology and the disclosure of certain medical information by electronic transmission. I acknowledge that I have been given the opportunity to request an in-person assessment or  other available alternative prior to the telemedicine visit and am voluntarily participating in the telemedicine visit.  I understand that I have the right to withhold or withdraw my consent to the use of telemedicine in the course of my care at any time, without affecting my right to future care or treatment, and that the Practitioner or I may terminate the telemedicine visit at any time. I understand that I have the right to inspect all information obtained and/or recorded in the course of the telemedicine visit and may receive copies of available information for a reasonable fee.  I understand that some of the potential risks of receiving the Services via telemedicine include:  Marland Kitchen Delay or interruption in medical evaluation due to technological equipment failure or disruption; . Information transmitted may not be sufficient (e.g. poor resolution of images) to allow for appropriate medical decision making by the Practitioner; and/or  . In rare instances, security protocols could fail, causing a breach of personal health information.  Furthermore, I acknowledge that it is my responsibility to provide information about my  medical history, conditions and care that is complete and accurate to the best of my ability. I acknowledge that Practitioner's advice, recommendations, and/or decision may be based on factors not within their control, such as incomplete or inaccurate data provided by me or distortions of diagnostic images or specimens that may result from electronic transmissions. I understand that the practice of medicine is not an exact science and that Practitioner makes no warranties or guarantees regarding treatment outcomes. I acknowledge that I will receive a copy of this consent concurrently upon execution via email to the email address I last provided but may also request a printed copy by calling the office of [facility].    I understand that my insurance will be billed for this visit.   I  have read or had this consent read to me. . I understand the contents of this consent, which adequately explains the benefits and risks of the Services being provided via telemedicine.  . I have been provided ample opportunity to ask questions regarding this consent and the Services and have had my questions answered to my satisfaction. . I give my informed consent for the services to be provided through the use of telemedicine in my medical care  By participating in this telemedicine visit I agree to the above.

## 2018-12-06 ENCOUNTER — Other Ambulatory Visit: Payer: Self-pay

## 2018-12-06 ENCOUNTER — Telehealth: Payer: Medicare Other | Admitting: Internal Medicine

## 2018-12-12 ENCOUNTER — Inpatient Hospital Stay: Payer: Medicare Other | Admitting: Critical Care Medicine

## 2018-12-14 ENCOUNTER — Other Ambulatory Visit: Payer: Self-pay

## 2018-12-14 ENCOUNTER — Telehealth: Payer: Medicare Other | Admitting: Internal Medicine

## 2019-02-07 ENCOUNTER — Ambulatory Visit: Payer: Medicare Other | Admitting: Emergency Medicine

## 2019-02-19 ENCOUNTER — Ambulatory Visit: Payer: Medicare Other | Admitting: Family

## 2019-03-18 ENCOUNTER — Ambulatory Visit: Payer: Medicare Other | Admitting: Emergency Medicine

## 2019-03-22 ENCOUNTER — Encounter: Payer: Self-pay | Admitting: Emergency Medicine

## 2019-04-02 ENCOUNTER — Encounter (HOSPITAL_COMMUNITY): Payer: Self-pay

## 2019-04-02 ENCOUNTER — Emergency Department (HOSPITAL_COMMUNITY)
Admission: EM | Admit: 2019-04-02 | Discharge: 2019-04-03 | Disposition: A | Discharge status: Home / Self Care | Payer: Medicare Other | Source: Home / Self Care | Attending: Emergency Medicine | Admitting: Emergency Medicine

## 2019-04-02 ENCOUNTER — Other Ambulatory Visit: Payer: Self-pay

## 2019-04-02 DIAGNOSIS — I2510 Atherosclerotic heart disease of native coronary artery without angina pectoris: Secondary | ICD-10-CM | POA: Insufficient documentation

## 2019-04-02 DIAGNOSIS — Z9114 Patient's other noncompliance with medication regimen: Secondary | ICD-10-CM | POA: Diagnosis not present

## 2019-04-02 DIAGNOSIS — R52 Pain, unspecified: Secondary | ICD-10-CM | POA: Diagnosis not present

## 2019-04-02 DIAGNOSIS — R0902 Hypoxemia: Secondary | ICD-10-CM | POA: Diagnosis not present

## 2019-04-02 DIAGNOSIS — I499 Cardiac arrhythmia, unspecified: Secondary | ICD-10-CM | POA: Diagnosis not present

## 2019-04-02 DIAGNOSIS — F17210 Nicotine dependence, cigarettes, uncomplicated: Secondary | ICD-10-CM | POA: Insufficient documentation

## 2019-04-02 DIAGNOSIS — R0789 Other chest pain: Secondary | ICD-10-CM | POA: Diagnosis not present

## 2019-04-02 DIAGNOSIS — Z79899 Other long term (current) drug therapy: Secondary | ICD-10-CM | POA: Insufficient documentation

## 2019-04-02 DIAGNOSIS — R05 Cough: Secondary | ICD-10-CM | POA: Insufficient documentation

## 2019-04-02 DIAGNOSIS — F1020 Alcohol dependence, uncomplicated: Secondary | ICD-10-CM | POA: Diagnosis not present

## 2019-04-02 DIAGNOSIS — J439 Emphysema, unspecified: Secondary | ICD-10-CM | POA: Diagnosis not present

## 2019-04-02 DIAGNOSIS — Y908 Blood alcohol level of 240 mg/100 ml or more: Secondary | ICD-10-CM | POA: Insufficient documentation

## 2019-04-02 DIAGNOSIS — E876 Hypokalemia: Secondary | ICD-10-CM

## 2019-04-02 DIAGNOSIS — R079 Chest pain, unspecified: Secondary | ICD-10-CM | POA: Insufficient documentation

## 2019-04-02 DIAGNOSIS — I2583 Coronary atherosclerosis due to lipid rich plaque: Secondary | ICD-10-CM | POA: Diagnosis not present

## 2019-04-02 DIAGNOSIS — R0602 Shortness of breath: Secondary | ICD-10-CM | POA: Insufficient documentation

## 2019-04-02 DIAGNOSIS — Z20822 Contact with and (suspected) exposure to covid-19: Secondary | ICD-10-CM

## 2019-04-02 DIAGNOSIS — Z743 Need for continuous supervision: Secondary | ICD-10-CM | POA: Diagnosis not present

## 2019-04-02 DIAGNOSIS — J9811 Atelectasis: Secondary | ICD-10-CM | POA: Diagnosis not present

## 2019-04-02 DIAGNOSIS — I200 Unstable angina: Secondary | ICD-10-CM | POA: Diagnosis not present

## 2019-04-02 DIAGNOSIS — F1010 Alcohol abuse, uncomplicated: Secondary | ICD-10-CM | POA: Diagnosis not present

## 2019-04-02 DIAGNOSIS — Z853 Personal history of malignant neoplasm of breast: Secondary | ICD-10-CM | POA: Insufficient documentation

## 2019-04-02 DIAGNOSIS — Z7982 Long term (current) use of aspirin: Secondary | ICD-10-CM | POA: Insufficient documentation

## 2019-04-02 DIAGNOSIS — R112 Nausea with vomiting, unspecified: Secondary | ICD-10-CM | POA: Diagnosis not present

## 2019-04-02 DIAGNOSIS — Z8546 Personal history of malignant neoplasm of prostate: Secondary | ICD-10-CM | POA: Insufficient documentation

## 2019-04-02 DIAGNOSIS — F10239 Alcohol dependence with withdrawal, unspecified: Secondary | ICD-10-CM | POA: Diagnosis not present

## 2019-04-02 DIAGNOSIS — Z951 Presence of aortocoronary bypass graft: Secondary | ICD-10-CM | POA: Insufficient documentation

## 2019-04-02 DIAGNOSIS — I469 Cardiac arrest, cause unspecified: Secondary | ICD-10-CM | POA: Diagnosis not present

## 2019-04-02 DIAGNOSIS — F10920 Alcohol use, unspecified with intoxication, uncomplicated: Secondary | ICD-10-CM | POA: Insufficient documentation

## 2019-04-02 DIAGNOSIS — R4020 Unspecified coma: Secondary | ICD-10-CM | POA: Diagnosis not present

## 2019-04-02 DIAGNOSIS — J90 Pleural effusion, not elsewhere classified: Secondary | ICD-10-CM | POA: Diagnosis not present

## 2019-04-02 DIAGNOSIS — Z8673 Personal history of transient ischemic attack (TIA), and cerebral infarction without residual deficits: Secondary | ICD-10-CM | POA: Insufficient documentation

## 2019-04-02 DIAGNOSIS — Z20828 Contact with and (suspected) exposure to other viral communicable diseases: Secondary | ICD-10-CM | POA: Insufficient documentation

## 2019-04-02 DIAGNOSIS — R778 Other specified abnormalities of plasma proteins: Secondary | ICD-10-CM | POA: Diagnosis not present

## 2019-04-02 DIAGNOSIS — R279 Unspecified lack of coordination: Secondary | ICD-10-CM | POA: Diagnosis not present

## 2019-04-02 DIAGNOSIS — R7989 Other specified abnormal findings of blood chemistry: Secondary | ICD-10-CM | POA: Diagnosis not present

## 2019-04-02 DIAGNOSIS — Z880 Allergy status to penicillin: Secondary | ICD-10-CM | POA: Diagnosis not present

## 2019-04-02 DIAGNOSIS — M1712 Unilateral primary osteoarthritis, left knee: Secondary | ICD-10-CM | POA: Diagnosis not present

## 2019-04-02 DIAGNOSIS — Z7951 Long term (current) use of inhaled steroids: Secondary | ICD-10-CM | POA: Diagnosis not present

## 2019-04-02 DIAGNOSIS — I10 Essential (primary) hypertension: Secondary | ICD-10-CM | POA: Diagnosis not present

## 2019-04-02 DIAGNOSIS — N390 Urinary tract infection, site not specified: Secondary | ICD-10-CM | POA: Diagnosis not present

## 2019-04-02 DIAGNOSIS — R1110 Vomiting, unspecified: Secondary | ICD-10-CM | POA: Diagnosis not present

## 2019-04-02 DIAGNOSIS — R42 Dizziness and giddiness: Secondary | ICD-10-CM | POA: Diagnosis not present

## 2019-04-02 DIAGNOSIS — E7800 Pure hypercholesterolemia, unspecified: Secondary | ICD-10-CM | POA: Diagnosis not present

## 2019-04-02 NOTE — ED Triage Notes (Signed)
Patient coming from home with complaints of alcohol intoxication and generalized pain (chest, back, shoulder, knee). Patient endorses a fall but cannot state when fall occurred or provide details of fall.

## 2019-04-03 ENCOUNTER — Emergency Department (HOSPITAL_COMMUNITY): Payer: Medicare Other

## 2019-04-03 LAB — COMPREHENSIVE METABOLIC PANEL
ALT: 28 U/L (ref 0–44)
AST: 54 U/L — ABNORMAL HIGH (ref 15–41)
Albumin: 3.5 g/dL (ref 3.5–5.0)
Alkaline Phosphatase: 78 U/L (ref 38–126)
Anion gap: 15 (ref 5–15)
BUN: <5 mg/dL — ABNORMAL LOW (ref 8–23)
CO2: 19 mmol/L — ABNORMAL LOW (ref 22–32)
Calcium: 8 mg/dL — ABNORMAL LOW (ref 8.9–10.3)
Chloride: 100 mmol/L (ref 98–111)
Creatinine, Ser: 0.58 mg/dL — ABNORMAL LOW (ref 0.61–1.24)
GFR calc Af Amer: >60 mL/min (ref >60)
GFR calc non Af Amer: >60 mL/min (ref >60)
Glucose, Bld: 81 mg/dL (ref 70–99)
Potassium: 3 mmol/L — ABNORMAL LOW (ref 3.5–5.1)
Sodium: 134 mmol/L — ABNORMAL LOW (ref 135–145)
Total Bilirubin: 0.8 mg/dL (ref 0.3–1.2)
Total Protein: 6.5 g/dL (ref 6.5–8.1)

## 2019-04-03 LAB — RAPID URINE DRUG SCREEN, HOSP PERFORMED
Amphetamines: NOT DETECTED
Barbiturates: NOT DETECTED
Benzodiazepines: NOT DETECTED
Cocaine: NOT DETECTED
Opiates: NOT DETECTED
Tetrahydrocannabinol: NOT DETECTED

## 2019-04-03 LAB — ETHANOL: Alcohol, Ethyl (B): 308 mg/dL (ref <10)

## 2019-04-03 LAB — TROPONIN I (HIGH SENSITIVITY)
Troponin I (High Sensitivity): 11 ng/L (ref <18)
Troponin I (High Sensitivity): 8 ng/L (ref <18)

## 2019-04-03 LAB — SARS CORONAVIRUS 2 (TAT 6-24 HRS): SARS Coronavirus 2: NEGATIVE

## 2019-04-03 MED ORDER — SODIUM CHLORIDE 0.9 % IV BOLUS: 500.0000 mL | Freq: Once | INTRAVENOUS | Status: DC

## 2019-04-03 MED ORDER — ALBUTEROL SULFATE HFA 108 (90 BASE) MCG/ACT IN AERS
8.0000 | INHALATION_SPRAY | Freq: Once | RESPIRATORY_TRACT | Status: AC
  Administered 2019-04-03: 8 via RESPIRATORY_TRACT
  Filled 2019-04-03: qty 6.7

## 2019-04-03 MED ORDER — POTASSIUM CHLORIDE ER 10 MEQ PO TBCR: 10.0000 meq | EXTENDED_RELEASE_TABLET | Freq: Every day | ORAL | 0 refills | Status: DC

## 2019-04-03 MED ORDER — ACETAMINOPHEN 325 MG PO TABS
650.0000 mg | ORAL_TABLET | Freq: Once | ORAL | Status: AC
  Administered 2019-04-03: 650 mg via ORAL
  Filled 2019-04-03: qty 2

## 2019-04-03 MED ORDER — AEROCHAMBER Z-STAT PLUS/MEDIUM MISC
1.0000 | Freq: Once | Status: AC
  Administered 2019-04-03: 1
  Filled 2019-04-03: qty 1

## 2019-04-03 NOTE — ED Notes (Signed)
Pt discharged. Pt verbalizes understanding over instructions but voices concern related to not having a ride home since the ambulance brought him; pt has pending COVID test. Pt significant other does not drive and pt has no other persons that could take him home per verbal. This Probation officer spoke with charge RN and [facility] regarding plan to transport pt home. Social worker called by this Probation officer and verbalize will call back.

## 2019-04-03 NOTE — ED Notes (Signed)
Awaiting pt COVID results. Per provider, pt to eat/sober then attempt to walk.

## 2019-04-03 NOTE — ED Provider Notes (Signed)
[facility] DEPT Provider Note   CSN: [id number] Arrival date & time: 04/02/19  2329     History   Chief Complaint Chief Complaint  Patient presents with   Generalized Pain   Alcohol Intoxication    HPI [name] Pace is a 68 y.o. male with history of alcohol use disorder, CAD, multiple rib fractures, CVA, prostate cancer, breast cancer, cocaine use disorder, and multiple compression fractures of the thoracic spine who presents to the emergency department with a chief complaint of back pain.  The patient endorses pain in the middle of his upper back, between his shoulder blades.  He is unable to characterize how long the pain is been ongoing.  He is concerned that he is having another heart attack. He reports associated shortness of breath, cough, and chest pain.  He is unable to characterize the chest pain.  He denies fever, chills, numbness, weakness, diaphoresis, abdominal pain, nausea, vomiting, or diarrhea.  He reports that his significant other with whom he lives was diagnosed with COVID-19 on 10/23.   He reports that he "drank several thimbles full of liquor today."  Denies cocaine use.  He reports that he has had several falls recently due to needing a knee replacement on the left knee.  He denies any falls today.  He denies recently hitting his head.  He has no headache or visual changes.  He reports that he has a history of COPD and is supposed to have several inhalers at home, but he has been out of these medications for some time.  [name] Pace was evaluated in Emergency Department on 04/03/2019 for the symptoms described in the history of present illness. He was evaluated in the context of the global COVID-19 pandemic, which necessitated consideration that the patient might be at risk for infection with the SARS-CoV-2 virus that causes COVID-19. Institutional protocols and algorithms that pertain to the evaluation of patients at risk for  COVID-19 are in a state of rapid change based on information released by regulatory bodies including the CDC and federal and state organizations. These policies and algorithms were followed during the patient's care in the ED.  Level 5 caveat secondary to alcohol intoxication.     The history is provided by the patient. No language interpreter was used.    Past Medical History:  Diagnosis Date   Alcohol abuse    Anxiety    Breast cancer ([place]) Left breast   Per patient diagnosed in early 50's.    Cocaine abuse ([place]) 08/19/2018   Coronary artery disease    High cholesterol    Left rib fracture 08/19/2018   Prostate cancer ([place])    per patient diagnosed in early 50's   Stroke [facility])     Patient Active Problem List   Diagnosis Date Noted   Abnormal echocardiogram 12/02/2018   Compression fracture of T6 vertebra (HCC)    Compression fracture of T7 vertebra (HCC)    Compression fracture of T8 vertebra (HCC)    COPD exacerbation ([place]) 12/01/2018   T6 vertebral fracture ([place]) 12/01/2018   T7 vertebral fracture ([place]) 12/01/2018   T8 vertebral fracture ([place]) 12/01/2018   Cocaine abuse ([place]) 08/19/2018   Polysubstance abuse ([place]) 08/19/2018   Left rib fracture 08/19/2018   Potomania 08/19/2018   Alcohol abuse with intoxication ([place]) 08/19/2018   At risk for adverse drug event 06/12/2018   Palliative care encounter    Encephalopathy acute    Delirium tremens ([place]) 05/18/2018  Malnutrition of moderate degree 05/18/2018   Acute respiratory failure (HCC)    Shock circulatory ([place])    Multiple rib fractures 05/14/2018   Pneumothorax, closed, traumatic, initial encounter 05/14/2018   Traumatic fracture of ribs with pneumothorax    Laceration of muscle(s) and tendon(s) of anterior muscle group at lower leg level, right leg, initial encounter 02/10/2016   Traumatic rupture of right anterior tibial tendon 02/10/2016   Hip fracture, left ([place]) 07/02/2014    Closed left hip fracture ([place]) 07/02/2014   Hip fracture ([place]) 07/02/2014   Intertrochanteric fracture of left femur ([place]) 07/02/2014   History of stroke    Preop examination    Subclavian artery stenosis, left ([place]) 06/15/2014   Chest pain 06/14/2014   CAP (community acquired pneumonia) 06/14/2014   Sepsis ([place]) 06/14/2014   Alcohol abuse 06/14/2014   CAD (coronary artery disease) 06/14/2014   Tobacco abuse 06/14/2014    Past Surgical History:  Procedure Laterality Date   CAROTID STENT     CORONARY ARTERY BYPASS GRAFT     FEMUR IM NAIL Left 07/02/2014   Procedure: INTRAMEDULLARY (IM) NAIL FEMORAL;  Surgeon: Elie Goody, MD;  Location: [facility];  Service: Orthopedics;  Laterality: Left;   I&D EXTREMITY Right 02/10/2016   Procedure: IRRIGATION AND DEBRIDEMENT EXTREMITY;  Surgeon: Dorna Leitz, MD;  Location: [facility];  Service: Orthopedics;  Laterality: Right;   TENDON REPAIR Right 02/10/2016   Procedure: Anterior TENDON REPAIR and wound exploration.;  Surgeon: Dorna Leitz, MD;  Location: [facility];  Service: Orthopedics;  Laterality: Right;        Home Medications    Prior to Admission medications   Medication Sig Start Date End Date Taking? Authorizing Provider  acetaminophen (TYLENOL) 500 MG tablet Take 500-1,000 mg by mouth every 6 (six) hours as needed for moderate pain.    [provider]  albuterol (VENTOLIN HFA) 108 (90 Base) MCG/ACT inhaler Inhale 2 puffs into the lungs every 6 (six) hours as needed for wheezing or shortness of breath. Use 2 puffs 3 times daily x5 days, then every 6 hours as needed. 12/02/18   Eugenie Filler, MD  aspirin EC 81 MG tablet Take 81 mg by mouth daily.    [provider]  atorvastatin (LIPITOR) 40 MG tablet Take 1 tablet (40 mg total) by mouth daily at 6 PM. 12/02/18   Eugenie Filler, MD  cholecalciferol (VITAMIN D3) 25 MCG (1000 UT) tablet Take 1,000 Units by mouth daily.    [provider]    fluticasone (FLONASE) 50 MCG/ACT nasal spray Place 2 sprays into both nostrils daily. 12/03/18   Eugenie Filler, MD  folic acid (FOLVITE) 1 MG tablet Take 1 tablet (1 mg total) by mouth daily. Patient not taking: Reported on 08/20/2018 06/09/18   Debbe Odea, MD  hydrOXYzine (ATARAX/VISTARIL) 25 MG tablet Take 1 tablet (25 mg total) by mouth every 6 (six) hours as needed for anxiety (or CIWA score </= 10). 12/02/18   Eugenie Filler, MD  lidocaine (LIDODERM) 5 % Place 2 patches onto the skin daily. Remove & Discard patch within 12 hours or as directed by MD Patient not taking: Reported on 08/20/2018 06/09/18   Debbe Odea, MD  loratadine (CLARITIN) 10 MG tablet Take 1 tablet (10 mg total) by mouth daily. 12/03/18   Eugenie Filler, MD  mometasone-formoterol (DULERA) 100-5 MCG/ACT AERO Inhale 2 puffs into the lungs 2 (two) times daily. 12/02/18   Eugenie Filler, MD  Multiple  Vitamin (MULTIVITAMIN WITH MINERALS) TABS tablet Take 1 tablet by mouth daily. 12/03/18   Eugenie Filler, MD  nicotine (NICODERM CQ - DOSED IN MG/24 HOURS) 14 mg/24hr patch Place 1 patch (14 mg total) onto the skin daily. 12/03/18   Eugenie Filler, MD  OLANZapine (ZYPREXA) 2.5 MG tablet Take 2 tablets (5 mg total) by mouth at bedtime. Patient not taking: Reported on 08/20/2018 06/09/18   Debbe Odea, MD  OLANZapine (ZYPREXA) 5 MG tablet Take 0.5 tablets (2.5 mg total) by mouth daily as needed (agitation). Patient not taking: Reported on 08/20/2018 06/09/18   Debbe Odea, MD  pantoprazole (PROTONIX) 40 MG tablet Take 1 tablet (40 mg total) by mouth daily at 6 (six) AM. 12/03/18   Eugenie Filler, MD  polyethylene glycol [facility] / Floria Raveling) packet Take 17 g by mouth daily as needed for moderate constipation or severe constipation. Patient not taking: Reported on 08/20/2018 06/09/18   Debbe Odea, MD  potassium chloride (KLOR-CON) 10 MEQ tablet Take 1 tablet (10 mEq total) by mouth daily. 04/03/19   Tacy Learn,  PA-C  predniSONE (DELTASONE) 20 MG tablet Take 1-2 tablets (20-40 mg total) by mouth daily with breakfast. Take 2 tablets (40mg ) daily x3 days, then 1 tablet (20mg ) daily x3 days then stop 12/03/18   Eugenie Filler, MD  thiamine 100 MG tablet Take 1 tablet (100 mg total) by mouth daily. 12/03/18   Eugenie Filler, MD  tiotropium (SPIRIVA HANDIHALER) 18 MCG inhalation capsule Place 1 capsule (18 mcg total) into inhaler and inhale daily for 30 days. 12/02/18 01/01/19  Eugenie Filler, MD  traMADol (ULTRAM) 50 MG tablet Take 1 tablet (50 mg total) by mouth every 6 (six) hours as needed. Patient not taking: Reported on 12/01/2018 11/23/18   Jola Schmidt, MD  vitamin B-12 1000 MCG tablet Take 1 tablet (1,000 mcg total) by mouth daily. Patient not taking: Reported on 08/20/2018 06/09/18   Debbe Odea, MD  vitamin C (ASCORBIC ACID) 500 MG tablet Take 500 mg by mouth daily.    [provider]    Family History Family History  Problem Relation Age of Onset   Dementia Mother     Social History Social History   Tobacco Use   Smoking status: Current Every Day Smoker    Packs/day: 1.00    Years: 42.00    Pack years: 42.00    Types: Cigarettes   Smokeless tobacco: Never Used  Substance Use Topics   Alcohol use: Yes    Comment: 4-5 beers a day   Drug use: No     Allergies   Penicillins   Review of Systems Review of Systems  Constitutional: Negative for appetite change, chills and fever.  HENT: Negative for congestion, ear discharge, ear pain, sinus pressure, sinus pain, sore throat and trouble swallowing.   Eyes: Negative for visual disturbance.  Respiratory: Positive for cough and shortness of breath. Negative for wheezing.   Cardiovascular: Positive for chest pain. Negative for palpitations and leg swelling.  Gastrointestinal: Negative for abdominal pain, constipation, diarrhea, nausea and vomiting.  Genitourinary: Negative for dysuria.  Musculoskeletal: Positive  for back pain. Negative for arthralgias, myalgias, neck pain and neck stiffness.  Skin: Negative for color change and rash.  Allergic/Immunologic: Negative for immunocompromised state.  Neurological: Negative for dizziness, seizures, syncope, weakness and headaches.  Psychiatric/Behavioral: Negative for confusion.     Physical Exam Updated Vital Signs BP 115/61    Pulse 84    Temp  98.4 F (36.9 C) (Oral)    Resp (!) 22    SpO2 94%   Physical Exam Vitals signs and nursing note reviewed.  Constitutional:      Appearance: He is well-developed.     Comments: Appears intoxicated  HENT:     Head: Normocephalic.     Nose:     Comments: Rhinophyma Eyes:     Conjunctiva/sclera: Conjunctivae normal.  Neck:     Musculoskeletal: Normal range of motion and neck supple.  Cardiovascular:     Rate and Rhythm: Normal rate and regular rhythm.     Pulses: Normal pulses.     Heart sounds: Normal heart sounds. No murmur. No friction rub. No gallop.   Pulmonary:     Effort: Pulmonary effort is normal.     Comments: Lung sounds are diminished throughout.  No adventitious breath sounds.  No respiratory distress, accessory muscle use, retractions. Chest:     Chest wall: No tenderness.  Abdominal:     General: There is no distension.     Palpations: Abdomen is soft. There is no mass.     Tenderness: There is no abdominal tenderness. There is no right CVA tenderness, left CVA tenderness, guarding or rebound.     Hernia: No hernia is present.  Skin:    General: Skin is warm and dry.  Neurological:     Mental Status: He is alert.     Comments: Follows simple commands.  Alert.  Speech is slurred.  Answers questions appropriately.  Alert and oriented x3.  Psychiatric:        Behavior: Behavior normal.      ED Treatments / Results  Labs (all labs ordered are listed, but only abnormal results are displayed) Labs Reviewed  ETHANOL - Abnormal; Notable for the following components:      Result  Value   Alcohol, Ethyl (B) 308 (*)    All other components within normal limits  COMPREHENSIVE METABOLIC PANEL - Abnormal; Notable for the following components:   Sodium 134 (*)    Potassium 3.0 (*)    CO2 19 (*)    BUN <5 (*)    Creatinine, Ser 0.58 (*)    Calcium 8.0 (*)    AST 54 (*)    All other components within normal limits  SARS CORONAVIRUS 2 (TAT 6-24 HRS)  RAPID URINE DRUG SCREEN, HOSP PERFORMED  TROPONIN I (HIGH SENSITIVITY)  TROPONIN I (HIGH SENSITIVITY)    EKG EKG Interpretation  Date/Time:  Wednesday April 03 2019 01:05:07 EDT Ventricular Rate:  81 PR Interval:    QRS Duration: 118 QT Interval:  411 QTC Calculation: 478 R Axis:   83 Text Interpretation: Sinus rhythm Probable LVH with secondary repol abnrm Borderline prolonged QT interval motion artifact Confirmed by Addison Lank [phone]) on 04/03/2019 1:11:13 AM   Radiology Dg Chest Portable 1 View  Result Date: 04/03/2019 CLINICAL DATA:  68 year old male with chest and back pain. EXAM: PORTABLE CHEST 1 VIEW COMPARISON:  Chest radiograph dated 12/01/2018 and CT dated 12/01/2018 FINDINGS: There is background of emphysema. There is minimal bibasilar atelectasis and right costophrenic density similar to prior radiograph, likely chronic changes and scarring. No lobar consolidation, pleural effusion, or pneumothorax. Thickened appearance of the right lateral pleural surface, likely chronic and scarring secondary to right rib fractures. No pneumothorax identified. Lucent line in the right upper lobe most consistent with skin fold. Stable cardiac silhouette. Median sternotomy wires and CABG vascular clips and left brachiocephalic vascular stent. Multiple  old healed right rib fractures. Slightly displaced appearance of several right lateral ribs may be chronic although acute fractures not excluded on this single view radiograph. Correlation with clinical exam recommended. If there is clinical concern for an acute fracture  dedicated rib series recommended. IMPRESSION: 1. No acute cardiopulmonary process. 2. Slightly displaced appearance of several right lateral ribs may be chronic although acute fractures not excluded on this single view. Dedicated rib series may provide better evaluation. Electronically Signed   By: Anner Crete M.D.   On: 04/03/2019 01:27    Procedures Procedures (including critical care time)  Medications Ordered in ED Medications  sodium chloride 0.9 % bolus 500 mL (has no administration in time range)  acetaminophen (TYLENOL) tablet 650 mg (650 mg Oral Given 04/03/19 0210)  albuterol (VENTOLIN HFA) 108 (90 Base) MCG/ACT inhaler 8 puff (8 puffs Inhalation Given 04/03/19 0212)  aerochamber Z-Stat Plus/medium 1 each (1 each Other Given 04/03/19 0212)     Initial Impression / Assessment and Plan / ED Course  I have reviewed the triage vital signs and the nursing notes.  Pertinent labs & imaging results that were available during my care of the patient were reviewed by me and considered in my medical decision making (see chart for details).        68 year old male with history of alcohol use disorder, COPD, CAD, multiple rib fractures, CVA, prostate cancer, breast cancer, cocaine use disorder, and multiple compression fractures of the thoracic spine presenting by EMS.  He appears intoxicated.  His primary concern is back pain between his shoulder blades, but also reports chest pain, shortness of breath, and cough.  His significant other was diagnosed with COVID-19 within the last week.  Afebrile on arrival.  He is hemodynamically stable and in no acute distress.  EKG with borderline prolonged QTC.  No other acute changes and no evidence of acute ischemia.  Delta troponin is not elevated.  He is mildly hypokalemic at 3.0.  Bicarb is decreased at 19, likely respiratory etiology.  Creatinine and anion gap are normal.  Alcohol level is 308.  Chest x-ray with minimal bibasilar atelectasis,  emphysema as well as other chronic changes.  Previous rib fractures are again visualized.  Patient did not have any tenderness palpation to the chest wall on initial exam.   He was treated with an albuterol inhaler with a spacer.  He has had no hypoxia since arrival in the ER.  COVID-19 test has been ordered.  Low suspicion for ACS, PE, esophageal rupture, pneumonia.  Will evaluate the patient further until he metabolizes ethanol and is clinically sober and will discharge home with albuterol inhaler and COVID-19 precautions.  On reevaluation of his lungs, he did not have any overt wheezing.  The patient was discussed with Dr. Leonette Monarch, attending physician.  Patient care transferred to [place] at the end of my shift. Patient presentation, ED course, and plan of care discussed with review of all pertinent labs and imaging. Please see his/her note for further details regarding further ED course and disposition.  Final Clinical Impressions(s) / ED Diagnoses   Final diagnoses:  None    ED Discharge Orders         Ordered    potassium chloride (KLOR-CON) 10 MEQ tablet  Daily     04/03/19 0652           Joanne Gavel, PA-C 04/03/19 [id number]    Fatima Blank, MD 04/04/19 [id number]

## 2019-04-03 NOTE — ED Provider Notes (Signed)
Cc back pain, hx COPD, intoxicated. Exposed to [place]. CXR consistent with COPD. COVID send out, dc with precautions, metabolize/ambulate with pulse ox when ready. Physical Exam  BP (!) 118/57   Pulse 73   Temp 98.4 F (36.9 C) (Oral)   Resp 18   SpO2 92%   Physical Exam  ED Course/Procedures     Procedures  MDM  Patient is currently sleeping, easily arouses to verbal stimuli.  Patient has been ambulatory, maintains oxygen saturation greater than 92%, currently 97%.  Patient has had breakfast and is ready for discharge.  Patient is advised to quarantine awaiting his Covid results.  Also given prescription for short course of potassium p.o.       Tacy Learn, PA-C 04/03/19 [place], [place], DO 04/03/19 1242

## 2019-04-03 NOTE — ED Notes (Signed)
Attempting to obtain second troponin

## 2019-04-03 NOTE — Discharge Instructions (Addendum)
Take potassium daily as prescribed. Home to quarantine awaiting your test results.

## 2019-04-04 ENCOUNTER — Emergency Department (HOSPITAL_COMMUNITY): Payer: Medicare Other

## 2019-04-04 ENCOUNTER — Inpatient Hospital Stay (HOSPITAL_COMMUNITY)
Admission: EM | Admit: 2019-04-04 | Discharge: 2019-04-13 | DRG: 313 | Disposition: A | Discharge status: Home Health | Payer: Medicare Other | Attending: Internal Medicine | Admitting: Internal Medicine

## 2019-04-04 ENCOUNTER — Other Ambulatory Visit: Payer: Self-pay

## 2019-04-04 ENCOUNTER — Encounter (HOSPITAL_COMMUNITY): Payer: Self-pay | Admitting: Emergency Medicine

## 2019-04-04 DIAGNOSIS — R0789 Other chest pain: Principal | ICD-10-CM | POA: Diagnosis present

## 2019-04-04 DIAGNOSIS — Z951 Presence of aortocoronary bypass graft: Secondary | ICD-10-CM

## 2019-04-04 DIAGNOSIS — F1010 Alcohol abuse, uncomplicated: Secondary | ICD-10-CM | POA: Diagnosis present

## 2019-04-04 DIAGNOSIS — J439 Emphysema, unspecified: Secondary | ICD-10-CM | POA: Diagnosis present

## 2019-04-04 DIAGNOSIS — Z79899 Other long term (current) drug therapy: Secondary | ICD-10-CM

## 2019-04-04 DIAGNOSIS — R7989 Other specified abnormal findings of blood chemistry: Secondary | ICD-10-CM

## 2019-04-04 DIAGNOSIS — B964 Proteus (mirabilis) (morganii) as the cause of diseases classified elsewhere: Secondary | ICD-10-CM | POA: Diagnosis present

## 2019-04-04 DIAGNOSIS — Z880 Allergy status to penicillin: Secondary | ICD-10-CM

## 2019-04-04 DIAGNOSIS — I2510 Atherosclerotic heart disease of native coronary artery without angina pectoris: Secondary | ICD-10-CM | POA: Diagnosis present

## 2019-04-04 DIAGNOSIS — Z7982 Long term (current) use of aspirin: Secondary | ICD-10-CM

## 2019-04-04 DIAGNOSIS — Z8673 Personal history of transient ischemic attack (TIA), and cerebral infarction without residual deficits: Secondary | ICD-10-CM

## 2019-04-04 DIAGNOSIS — Z781 Physical restraint status: Secondary | ICD-10-CM

## 2019-04-04 DIAGNOSIS — J9811 Atelectasis: Secondary | ICD-10-CM | POA: Diagnosis not present

## 2019-04-04 DIAGNOSIS — R0682 Tachypnea, not elsewhere classified: Secondary | ICD-10-CM

## 2019-04-04 DIAGNOSIS — N390 Urinary tract infection, site not specified: Secondary | ICD-10-CM | POA: Diagnosis present

## 2019-04-04 DIAGNOSIS — Z853 Personal history of malignant neoplasm of breast: Secondary | ICD-10-CM

## 2019-04-04 DIAGNOSIS — Z9114 Patient's other noncompliance with medication regimen: Secondary | ICD-10-CM

## 2019-04-04 DIAGNOSIS — F10239 Alcohol dependence with withdrawal, unspecified: Secondary | ICD-10-CM | POA: Diagnosis present

## 2019-04-04 DIAGNOSIS — E876 Hypokalemia: Secondary | ICD-10-CM | POA: Diagnosis present

## 2019-04-04 DIAGNOSIS — G312 Degeneration of nervous system due to alcohol: Secondary | ICD-10-CM | POA: Diagnosis present

## 2019-04-04 DIAGNOSIS — F1020 Alcohol dependence, uncomplicated: Secondary | ICD-10-CM

## 2019-04-04 DIAGNOSIS — Z7951 Long term (current) use of inhaled steroids: Secondary | ICD-10-CM

## 2019-04-04 DIAGNOSIS — I10 Essential (primary) hypertension: Secondary | ICD-10-CM | POA: Diagnosis present

## 2019-04-04 DIAGNOSIS — R0602 Shortness of breath: Secondary | ICD-10-CM

## 2019-04-04 DIAGNOSIS — M1712 Unilateral primary osteoarthritis, left knee: Secondary | ICD-10-CM | POA: Diagnosis present

## 2019-04-04 DIAGNOSIS — F10229 Alcohol dependence with intoxication, unspecified: Secondary | ICD-10-CM | POA: Diagnosis present

## 2019-04-04 DIAGNOSIS — R778 Other specified abnormalities of plasma proteins: Secondary | ICD-10-CM

## 2019-04-04 DIAGNOSIS — Z8546 Personal history of malignant neoplasm of prostate: Secondary | ICD-10-CM

## 2019-04-04 DIAGNOSIS — J90 Pleural effusion, not elsewhere classified: Secondary | ICD-10-CM

## 2019-04-04 DIAGNOSIS — R296 Repeated falls: Secondary | ICD-10-CM | POA: Diagnosis present

## 2019-04-04 DIAGNOSIS — E7800 Pure hypercholesterolemia, unspecified: Secondary | ICD-10-CM | POA: Diagnosis present

## 2019-04-04 DIAGNOSIS — F17210 Nicotine dependence, cigarettes, uncomplicated: Secondary | ICD-10-CM | POA: Diagnosis present

## 2019-04-04 DIAGNOSIS — F419 Anxiety disorder, unspecified: Secondary | ICD-10-CM | POA: Diagnosis present

## 2019-04-04 DIAGNOSIS — Z20828 Contact with and (suspected) exposure to other viral communicable diseases: Secondary | ICD-10-CM | POA: Diagnosis present

## 2019-04-04 DIAGNOSIS — R079 Chest pain, unspecified: Secondary | ICD-10-CM

## 2019-04-04 LAB — COMPREHENSIVE METABOLIC PANEL
ALT: 31 U/L (ref 0–44)
AST: 49 U/L — ABNORMAL HIGH (ref 15–41)
Albumin: 4.1 g/dL (ref 3.5–5.0)
Alkaline Phosphatase: 104 U/L (ref 38–126)
Anion gap: 18 — ABNORMAL HIGH (ref 5–15)
BUN: 5 mg/dL — ABNORMAL LOW (ref 8–23)
CO2: 20 mmol/L — ABNORMAL LOW (ref 22–32)
Calcium: 9.2 mg/dL (ref 8.9–10.3)
Chloride: 99 mmol/L (ref 98–111)
Creatinine, Ser: 0.83 mg/dL (ref 0.61–1.24)
GFR calc Af Amer: >60 mL/min (ref >60)
GFR calc non Af Amer: >60 mL/min (ref >60)
Glucose, Bld: 124 mg/dL — ABNORMAL HIGH (ref 70–99)
Potassium: 4.1 mmol/L (ref 3.5–5.1)
Sodium: 137 mmol/L (ref 135–145)
Total Bilirubin: 2.2 mg/dL — ABNORMAL HIGH (ref 0.3–1.2)
Total Protein: 7.4 g/dL (ref 6.5–8.1)

## 2019-04-04 LAB — CBC
HCT: 47.7 % (ref 39.0–52.0)
Hemoglobin: 16.7 g/dL (ref 13.0–17.0)
MCH: 36.9 pg — ABNORMAL HIGH (ref 26.0–34.0)
MCHC: 35 g/dL (ref 30.0–36.0)
MCV: 105.5 fL — ABNORMAL HIGH (ref 80.0–100.0)
Platelets: 204 10*3/uL (ref 150–400)
RBC: 4.52 MIL/uL (ref 4.22–5.81)
RDW: 13.2 % (ref 11.5–15.5)
WBC: 12.1 10*3/uL — ABNORMAL HIGH (ref 4.0–10.5)
nRBC: 0 % (ref 0.0–0.2)

## 2019-04-04 LAB — PROTIME-INR
INR: 0.9 (ref 0.8–1.2)
Prothrombin Time: 12.3 seconds (ref 11.4–15.2)

## 2019-04-04 LAB — TROPONIN I (HIGH SENSITIVITY): Troponin I (High Sensitivity): 20 ng/L — ABNORMAL HIGH (ref <18)

## 2019-04-04 LAB — LIPASE, BLOOD: Lipase: 24 U/L (ref 11–51)

## 2019-04-04 LAB — AMMONIA: Ammonia: 24 umol/L (ref 9–35)

## 2019-04-04 LAB — LACTIC ACID, PLASMA: Lactic Acid, Venous: 2.3 mmol/L (ref 0.5–1.9)

## 2019-04-04 MED ORDER — IOHEXOL 300 MG/ML  SOLN
100.0000 mL | Freq: Once | INTRAMUSCULAR | Status: AC | PRN
  Administered 2019-04-04: 100 mL via INTRAVENOUS

## 2019-04-04 MED ORDER — VITAMIN B-1 100 MG PO TABS
100.0000 mg | ORAL_TABLET | Freq: Every day | ORAL | Status: DC
  Administered 2019-04-04: 100 mg via ORAL

## 2019-04-04 MED ORDER — LORAZEPAM 2 MG/ML IJ SOLN: 0.0000 mg | Freq: Two times a day (BID) | INTRAMUSCULAR | Status: DC

## 2019-04-04 MED ORDER — THIAMINE HCL 100 MG/ML IJ SOLN
100.0000 mg | Freq: Every day | INTRAMUSCULAR | Status: DC
  Filled 2019-04-04: qty 2

## 2019-04-04 MED ORDER — THIAMINE HCL 100 MG/ML IJ SOLN
100.0000 mg | Freq: Once | INTRAMUSCULAR | Status: AC
  Administered 2019-04-04: 100 mg via INTRAVENOUS

## 2019-04-04 MED ORDER — LORAZEPAM 2 MG/ML IJ SOLN
0.0000 mg | Freq: Four times a day (QID) | INTRAMUSCULAR | Status: DC
  Administered 2019-04-04: 1 mg via INTRAVENOUS
  Administered 2019-04-05: 0 mg via INTRAVENOUS
  Filled 2019-04-04 (×2): qty 1

## 2019-04-04 MED ORDER — SODIUM CHLORIDE 0.9 % IV BOLUS
1000.0000 mL | Freq: Once | INTRAVENOUS | Status: AC
  Administered 2019-04-04: 1000 mL via INTRAVENOUS

## 2019-04-04 MED ORDER — LORAZEPAM 1 MG PO TABS: 0.0000 mg | ORAL_TABLET | Freq: Four times a day (QID) | ORAL | Status: DC

## 2019-04-04 MED ORDER — LORAZEPAM 1 MG PO TABS: 0.0000 mg | ORAL_TABLET | Freq: Two times a day (BID) | ORAL | Status: DC

## 2019-04-04 MED ORDER — SODIUM CHLORIDE 0.9% FLUSH
3.0000 mL | Freq: Once | INTRAVENOUS | Status: AC
  Administered 2019-04-05: 3 mL via INTRAVENOUS

## 2019-04-04 NOTE — ED Notes (Signed)
Pt informed that we needed a urine sample. PT unable to provide said sample at this time.

## 2019-04-04 NOTE — ED Notes (Signed)
Patient transported to X-ray 

## 2019-04-04 NOTE — ED Notes (Signed)
Pt began coughing, c/o of stomach and chest pain before promptly falling asleep once again. Pt urinated on self for second time. Bed and pt redressed.

## 2019-04-04 NOTE — ED Provider Notes (Signed)
68 year old male received at sign out from [facility] pending labs. Per her HPI:   "[name] Pace is a 68 y.o. male with a past medical history of chronic alcohol abuse, breast cancer, cocaine abuse, chronic tobacco abuse, previous CVA who presents emergency department with chief complaint of chest pain, nausea and vomiting.  He is status post triple coronary artery bypass graft there is a level 5 caveat due to altered mental status patient complains of pressure-like and sharp chest pain in the right part of his chest.  He is unable to specify whether it radiates.  He states "I am just dying of thirst."  He also states that he has been having nausea and vomiting.  He is unable to tell me if he has abdominal pain.  The patient does not know the year or month."  On my evaluation, he denies cocaine use.  History is limited as he is not cooperative.  He is able to pull himself up and sit upright in the bed.  He states I do not feel good.  My chest is hurting.  When attempting to lay flat, the patient calls out in pain.  Physical Exam  BP (!) 152/87   Pulse 64   Temp 98.5 F (36.9 C) (Oral)   Resp 16   Ht 6\' 3"  (1.905 m)   Wt 63.5 kg   SpO2 94%   BMI 17.50 kg/m   Physical Exam Vitals signs and nursing note reviewed.  Constitutional:      Comments: Thin, chronically ill-appearing male.  HENT:     Head: Normocephalic.     Comments: Rhinophyma Eyes:     Conjunctiva/sclera: Conjunctivae normal.  Neck:     Musculoskeletal: Neck supple.  Cardiovascular:     Rate and Rhythm: Normal rate and regular rhythm.     Heart sounds: No murmur.  Pulmonary:     Effort: Pulmonary effort is normal. No respiratory distress.     Breath sounds: No stridor. No wheezing, rhonchi or rales.     Comments: Lungs are clear to auscultation bilaterally.  Midline sternal scar that is well-healed is noted to the chest wall.  Tenderness palpation to the sternum without crepitus or step-offs. Chest:     Chest wall:  Tenderness present.  Abdominal:     General: There is no distension.     Palpations: Abdomen is soft. There is no mass.     Tenderness: There is no abdominal tenderness. There is no right CVA tenderness, left CVA tenderness, guarding or rebound.     Hernia: No hernia is present.     Comments: Abdomen is soft, nontender, nondistended.  Musculoskeletal:     Right lower leg: No edema.     Left lower leg: No edema.  Skin:    General: Skin is warm and dry.  Neurological:     Mental Status: He is alert.     Comments: Oriented to person, city, state, and year.  Curled up in the bed in the fetal position on arrival, but will sit up, open his eyes, and answer questions appropriately with encouragement.   Psychiatric:        Behavior: Behavior normal.    ED Course/Procedures   Clinical Course as of Apr 04 812  Thu Apr 04, 2019  2127 Troponin I (High Sensitivity)(!): 20 [AH]    Clinical Course User Index [AH] Margarita Mail, PA-C    Procedures  MDM   68 year old male with a past medical history of chronic  alcohol abuse, breast cancer, cocaine abuse, chronic tobacco abuse, previous CVA received a signout from [facility] [place] pending ethanol level, repeat troponin, and urinalysis.  Please see her note for further work-up and medical decision making.  On my initial evaluation, the patient is sleeping.  He denies chest pain at this time.  Second troponin is pending.  Ethanol level is not elevated.  On my second evaluation, at 04:15, the patient is oriented to person, year, city, and state.  He knows he is at the hospital. His initial troponin was 20 and repeat troponin was 29.  Of note, these were collected more than 2 hours apart.  The patient is endorsing chest pain at this time.  He states it is worse when the bed is flat.  He does have multiple known thoracic spine compression fractures.  Will repeat thoracic spine x-ray which does not identify any acute abnormalities and mid and lower thoracic  vertebral body compression fractures are stable.   Chest x-ray with mild cardiomegaly, but otherwise unremarkable.  He adamantly denies cocaine use, but UDS is pending.  The patient reports that he has had a CABG x3 previously, but these records are not available in epic.  The patient was discussed with Dr. Leonette Monarch, attending physician.  Lactate was elevated on arrival, but improved with IV fluids.  Urinalysis is still pending.  No evidence of DTs as patient is alert and oriented x3.  Consulted the hospitalist team and Dr. Hal Hope will admit for chest pain. The patient appears reasonably stabilized for admission considering the current resources, flow, and capabilities available in the ED at this time, and I doubt any other [facility] requiring further screening and/or treatment in the ED prior to admission.      Joanne Gavel, PA-C 04/05/19 0813    Fatima Blank, MD 04/06/19 [id number]

## 2019-04-04 NOTE — ED Notes (Signed)
Condom catheter placed on pt before transfer of care.

## 2019-04-04 NOTE — ED Provider Notes (Signed)
[facility] EMERGENCY DEPARTMENT Provider Note   CSN: [id number] Arrival date & time: 04/04/19  1826     History   Chief Complaint Chief Complaint  Patient presents with  . Chest Pain    HPI [name] Pace is a 68 y.o. male with a past medical history of chronic alcohol abuse, breast cancer, cocaine abuse, chronic tobacco abuse, previous CVA who presents emergency department with chief complaint of chest pain, nausea and vomiting.  He is status post triple coronary artery bypass graft there is a level 5 caveat due to altered mental status patient complains of pressure-like and sharp chest pain in the right part of his chest.  He is unable to specify whether it radiates.  He states "I am just dying of thirst."  He also states that he has been having nausea and vomiting.  He is unable to tell me if he has abdominal pain.  The patient does not know the year or month.      HPI  Past Medical History:  Diagnosis Date  . Alcohol abuse   . Anxiety   . Breast cancer ([place]) Left breast   Per patient diagnosed in early 23's.   . Cocaine abuse ([place]) 08/19/2018  . Coronary artery disease   . High cholesterol   . Left rib fracture 08/19/2018  . Prostate cancer ([place])    per patient diagnosed in early 14's  . Stroke [facility])     Patient Active Problem List   Diagnosis Date Noted  . Abnormal echocardiogram 12/02/2018  . Compression fracture of T6 vertebra (HCC)   . Compression fracture of T7 vertebra (HCC)   . Compression fracture of T8 vertebra ([place])   . COPD exacerbation ([place]) 12/01/2018  . T6 vertebral fracture ([place]) 12/01/2018  . T7 vertebral fracture ([place]) 12/01/2018  . T8 vertebral fracture ([place]) 12/01/2018  . Cocaine abuse ([place]) 08/19/2018  . Polysubstance abuse ([place]) 08/19/2018  . Left rib fracture 08/19/2018  . Potomania 08/19/2018  . Alcohol abuse with intoxication ([place]) 08/19/2018  . At risk for adverse drug event 06/12/2018  . Palliative care encounter    . Encephalopathy acute   . Delirium tremens ([place]) 05/18/2018  . Malnutrition of moderate degree 05/18/2018  . Acute respiratory failure ([place])   . Shock circulatory ([place])   . Multiple rib fractures 05/14/2018  . Pneumothorax, closed, traumatic, initial encounter 05/14/2018  . Traumatic fracture of ribs with pneumothorax   . Laceration of muscle(s) and tendon(s) of anterior muscle group at lower leg level, right leg, initial encounter 02/10/2016  . Traumatic rupture of right anterior tibial tendon 02/10/2016  . Hip fracture, left ([place]) 07/02/2014  . Closed left hip fracture ([place]) 07/02/2014  . Hip fracture ([place]) 07/02/2014  . Intertrochanteric fracture of left femur ([place]) 07/02/2014  . History of stroke   . Preop examination   . Subclavian artery stenosis, left ([place]) 06/15/2014  . Chest pain 06/14/2014  . CAP (community acquired pneumonia) 06/14/2014  . Sepsis ([place]) 06/14/2014  . Alcohol abuse 06/14/2014  . CAD (coronary artery disease) 06/14/2014  . Tobacco abuse 06/14/2014    Past Surgical History:  Procedure Laterality Date  . CAROTID STENT    . CORONARY ARTERY BYPASS GRAFT    . FEMUR IM NAIL Left 07/02/2014   Procedure: INTRAMEDULLARY (IM) NAIL FEMORAL;  Surgeon: Elie Goody, MD;  Location: [facility];  Service: Orthopedics;  Laterality: Left;  . I&D EXTREMITY Right 02/10/2016   Procedure: IRRIGATION AND DEBRIDEMENT EXTREMITY;  Surgeon: Dorna Leitz, MD;  Location: [facility];  Service: Orthopedics;  Laterality: Right;  . TENDON REPAIR Right 02/10/2016   Procedure: Anterior TENDON REPAIR and wound exploration.;  Surgeon: Dorna Leitz, MD;  Location: [facility];  Service: Orthopedics;  Laterality: Right;        Home Medications    Prior to Admission medications   Medication Sig Start Date End Date Taking? Authorizing Provider  acetaminophen (TYLENOL) 500 MG tablet Take 500-1,000 mg by mouth every 6 (six) hours as needed for moderate pain.    [provider]  albuterol  (VENTOLIN HFA) 108 (90 Base) MCG/ACT inhaler Inhale 2 puffs into the lungs every 6 (six) hours as needed for wheezing or shortness of breath. Use 2 puffs 3 times daily x5 days, then every 6 hours as needed. 12/02/18   Eugenie Filler, MD  aspirin EC 81 MG tablet Take 81 mg by mouth daily.    [provider]  atorvastatin (LIPITOR) 40 MG tablet Take 1 tablet (40 mg total) by mouth daily at 6 PM. 12/02/18   Eugenie Filler, MD  cholecalciferol (VITAMIN D3) 25 MCG (1000 UT) tablet Take 1,000 Units by mouth daily.    [provider]  fluticasone (FLONASE) 50 MCG/ACT nasal spray Place 2 sprays into both nostrils daily. 12/03/18   Eugenie Filler, MD  folic acid (FOLVITE) 1 MG tablet Take 1 tablet (1 mg total) by mouth daily. Patient not taking: Reported on 08/20/2018 06/09/18   Debbe Odea, MD  hydrOXYzine (ATARAX/VISTARIL) 25 MG tablet Take 1 tablet (25 mg total) by mouth every 6 (six) hours as needed for anxiety (or CIWA score </= 10). 12/02/18   Eugenie Filler, MD  lidocaine (LIDODERM) 5 % Place 2 patches onto the skin daily. Remove & Discard patch within 12 hours or as directed by MD Patient not taking: Reported on 08/20/2018 06/09/18   Debbe Odea, MD  loratadine (CLARITIN) 10 MG tablet Take 1 tablet (10 mg total) by mouth daily. 12/03/18   Eugenie Filler, MD  mometasone-formoterol (DULERA) 100-5 MCG/ACT AERO Inhale 2 puffs into the lungs 2 (two) times daily. 12/02/18   Eugenie Filler, MD  Multiple Vitamin (MULTIVITAMIN WITH MINERALS) TABS tablet Take 1 tablet by mouth daily. 12/03/18   Eugenie Filler, MD  nicotine (NICODERM CQ - DOSED IN MG/24 HOURS) 14 mg/24hr patch Place 1 patch (14 mg total) onto the skin daily. 12/03/18   Eugenie Filler, MD  OLANZapine (ZYPREXA) 2.5 MG tablet Take 2 tablets (5 mg total) by mouth at bedtime. Patient not taking: Reported on 08/20/2018 06/09/18   Debbe Odea, MD  OLANZapine (ZYPREXA) 5 MG tablet Take 0.5 tablets (2.5 mg total) by  mouth daily as needed (agitation). Patient not taking: Reported on 08/20/2018 06/09/18   Debbe Odea, MD  pantoprazole (PROTONIX) 40 MG tablet Take 1 tablet (40 mg total) by mouth daily at 6 (six) AM. 12/03/18   Eugenie Filler, MD  polyethylene glycol [facility] / Floria Raveling) packet Take 17 g by mouth daily as needed for moderate constipation or severe constipation. Patient not taking: Reported on 08/20/2018 06/09/18   Debbe Odea, MD  potassium chloride (KLOR-CON) 10 MEQ tablet Take 1 tablet (10 mEq total) by mouth daily. 04/03/19   Tacy Learn, PA-C  predniSONE (DELTASONE) 20 MG tablet Take 1-2 tablets (20-40 mg total) by mouth daily with breakfast. Take 2 tablets (40mg ) daily x3 days, then 1 tablet (20mg ) daily x3 days then stop 12/03/18  Eugenie Filler, MD  thiamine 100 MG tablet Take 1 tablet (100 mg total) by mouth daily. 12/03/18   Eugenie Filler, MD  tiotropium (SPIRIVA HANDIHALER) 18 MCG inhalation capsule Place 1 capsule (18 mcg total) into inhaler and inhale daily for 30 days. 12/02/18 01/01/19  Eugenie Filler, MD  traMADol (ULTRAM) 50 MG tablet Take 1 tablet (50 mg total) by mouth every 6 (six) hours as needed. Patient not taking: Reported on 12/01/2018 11/23/18   Jola Schmidt, MD  vitamin B-12 1000 MCG tablet Take 1 tablet (1,000 mcg total) by mouth daily. Patient not taking: Reported on 08/20/2018 06/09/18   Debbe Odea, MD  vitamin C (ASCORBIC ACID) 500 MG tablet Take 500 mg by mouth daily.    [provider]    Family History Family History  Problem Relation Age of Onset  . Dementia Mother     Social History Social History   Tobacco Use  . Smoking status: Current Every Day Smoker    Packs/day: 1.00    Years: 42.00    Pack years: 42.00    Types: Cigarettes  . Smokeless tobacco: Never Used  Substance Use Topics  . Alcohol use: Yes    Comment: 4-5 beers a day  . Drug use: No     Allergies   Penicillins   Review of Systems Review of Systems   Unable to perform ROS: Mental status change      Physical Exam Updated Vital Signs BP (!) 161/85   Pulse 85   Temp 98.1 F (36.7 C) (Oral)   Resp (!) 24   SpO2 97%   Physical Exam Vitals signs and nursing note reviewed.  Constitutional:      General: He is not in acute distress.    Appearance: He is ill-appearing. He is not diaphoretic.     Comments: Appears unkempt, sallow skin color  HENT:     Head: Normocephalic and atraumatic.  Eyes:     General: No scleral icterus.    Conjunctiva/sclera: Conjunctivae normal.  Neck:     Musculoskeletal: Normal range of motion and neck supple.  Cardiovascular:     Rate and Rhythm: Normal rate and regular rhythm.     Heart sounds: Normal heart sounds.  Pulmonary:     Effort: Pulmonary effort is normal. No respiratory distress.     Breath sounds: Normal breath sounds.  Chest:    Abdominal:     Palpations: Abdomen is soft.     Tenderness: There is abdominal tenderness in the periumbilical area and suprapubic area.  Skin:    General: Skin is warm and dry.     Comments: Telangiectasia over the face and chest, multiple open comedones over the face and chest.  Neurological:     Mental Status: He is alert.  Psychiatric:        Behavior: Behavior normal.      ED Treatments / Results  Labs (all labs ordered are listed, but only abnormal results are displayed) Labs Reviewed  CBC - Abnormal; Notable for the following components:      Result Value   WBC 12.1 (*)    MCV 105.5 (*)    MCH 36.9 (*)    All other components within normal limits  COMPREHENSIVE METABOLIC PANEL - Abnormal; Notable for the following components:   CO2 20 (*)    Glucose, Bld 124 (*)    BUN 5 (*)    AST 49 (*)    Total Bilirubin 2.2 (*)  Anion gap 18 (*)    All other components within normal limits  CULTURE, BLOOD (ROUTINE X 2)  CULTURE, BLOOD (ROUTINE X 2)  URINE CULTURE  SARS CORONAVIRUS 2 (TAT 6-24 HRS)  PROTIME-INR  LIPASE, BLOOD  AMMONIA   LACTIC ACID, PLASMA  LACTIC ACID, PLASMA  APTT  URINALYSIS, ROUTINE W REFLEX MICROSCOPIC  TROPONIN I (HIGH SENSITIVITY)  TROPONIN I (HIGH SENSITIVITY)    EKG EKG Interpretation  Date/Time:  Thursday April 04 2019 18:33:21 EDT Ventricular Rate:  90 PR Interval:    QRS Duration: 109 QT Interval:  387 QTC Calculation: 474 R Axis:   99 Text Interpretation: Sinus rhythm Probable left atrial enlargement Right axis deviation Consider left ventricular hypertrophy Repol abnrm suggests ischemia, diffuse leads Confirmed by Veryl Speak [phone]) on 04/04/2019 6:50:01 PM   Radiology Dg Chest 2 View  Result Date: 04/04/2019 CLINICAL DATA:  68 year old male with chest pain. EXAM: CHEST - 2 VIEW COMPARISON:  Chest radiograph dated 04/03/2019 FINDINGS: Shallow inspiration with minimal bibasilar atelectasis. No focal consolidation, pleural effusion, pneumothorax. Right pleural thickening and density at the right costophrenic angle, likely scarring related to prior rib fractures. Mild cardiomegaly. Median sternotomy wires and CABG vascular clips. Atherosclerotic calcification of the aorta. IMPRESSION: 1. No active cardiopulmonary disease.  No interval change. 2. Mild cardiomegaly. Electronically Signed   By: Anner Crete M.D.   On: 04/04/2019 19:22   Dg Chest Portable 1 View  Result Date: 04/03/2019 CLINICAL DATA:  68 year old male with chest and back pain. EXAM: PORTABLE CHEST 1 VIEW COMPARISON:  Chest radiograph dated 12/01/2018 and CT dated 12/01/2018 FINDINGS: There is background of emphysema. There is minimal bibasilar atelectasis and right costophrenic density similar to prior radiograph, likely chronic changes and scarring. No lobar consolidation, pleural effusion, or pneumothorax. Thickened appearance of the right lateral pleural surface, likely chronic and scarring secondary to right rib fractures. No pneumothorax identified. Lucent line in the right upper lobe most consistent with skin  fold. Stable cardiac silhouette. Median sternotomy wires and CABG vascular clips and left brachiocephalic vascular stent. Multiple old healed right rib fractures. Slightly displaced appearance of several right lateral ribs may be chronic although acute fractures not excluded on this single view radiograph. Correlation with clinical exam recommended. If there is clinical concern for an acute fracture dedicated rib series recommended. IMPRESSION: 1. No acute cardiopulmonary process. 2. Slightly displaced appearance of several right lateral ribs may be chronic although acute fractures not excluded on this single view. Dedicated rib series may provide better evaluation. Electronically Signed   By: Anner Crete M.D.   On: 04/03/2019 01:27    Procedures Procedures (including critical care time)  Medications Ordered in ED Medications  sodium chloride flush (NS) 0.9 % injection 3 mL (has no administration in time range)  thiamine (B-1) injection 100 mg (has no administration in time range)  LORazepam (ATIVAN) injection 0-4 mg (has no administration in time range)    Or  LORazepam (ATIVAN) tablet 0-4 mg (has no administration in time range)  LORazepam (ATIVAN) injection 0-4 mg (has no administration in time range)    Or  LORazepam (ATIVAN) tablet 0-4 mg (has no administration in time range)  thiamine (VITAMIN B-1) tablet 100 mg (has no administration in time range)    Or  thiamine (B-1) injection 100 mg (has no administration in time range)     Initial Impression / Assessment and Plan / ED Course  I have reviewed the triage vital signs and the nursing notes.  Pertinent labs & imaging results that were available during my care of the patient were reviewed by me and considered in my medical decision making (see chart for details).  Clinical Course as of Apr 04 2131  Thu Apr 04, 2019  2127 Troponin I (High Sensitivity)(!): 20 [AH]    Clinical Course User Index [AH] Margarita Mail, PA-C        Patient here with altered mental status. The differential diagnosis for AMS is extensive and includes, but is not limited to: drug overdose - opioids, alcohol, sedatives, antipsychotics, drug withdrawal, others; Metabolic: hypoxia, hypoglycemia, hyperglycemia, hypercalcemia, hypernatremia, hyponatremia, uremia, hepatic encephalopathy, hypothyroidism, hyperthyroidism, vitamin B12 or thiamine deficiency, carbon monoxide poisoning, Wilson's disease, Lactic acidosis, DKA/HHOS; Infectious: meningitis, encephalitis, bacteremia/sepsis, urinary tract infection, pneumonia, neurosyphilis; Structural: Space-occupying lesion, (brain tumor, subdural hematoma, hydrocephalus,); Vascular: stroke, subarachnoid hemorrhage, coronary ischemia, hypertensive encephalopathy, CNS vasculitis, thrombotic thrombocytopenic purpura, disseminated intravascular coagulation, hyperviscosity; Psychiatric: Schizophrenia, depression; Other: Seizure, hypothermia, heat stroke, ICU psychosis, dementia -"sundowning." Patient complaining of chest pain.  His troponin is slightly above normal today and he does have a history of CABG x3.  Will need a repeat troponin.  His white blood cell count is slightly elevated.  He has a macrocytosis without anemia.  CMP shows slightly decreased bicarb level, elevated blood glucose, elevated AST and total bilirubin.  He has an anion gap acidosis likely from lactic acidosis and dehydration.  PT/INR within normal limits, normal lipase and ammonia level.  Ethanol level pending.  I personally reviewed the patient's CT abdomen and pelvis which shows no evidence of abnormality except for thickened bladder which may represent urinary tract infection on my interpretation.  Patient is having tenderness in the suprapubic region on examination.    I personally reviewed the patient's head CT which shows no acute abnormalities by my interpretation.  I also reviewed the patient's chest x-ray which shows no infiltrates or  edema on my interpretation.  I have given signout to Ramsey who will assume care of the patient.  Awaiting his UA and ethanol levels. Final Clinical Impressions(s) / ED Diagnoses   Final diagnoses:  None    ED Discharge Orders    None       Margarita Mail, PA-C 04/05/19 0008    Veryl Speak, MD 04/07/19 2320

## 2019-04-04 NOTE — ED Triage Notes (Signed)
Pt arrived via [facility] c/o CP onset last night associated w/vomiting/nausea, dizziness, weakness and thirsty  Denies Inj/trauma, SOB, fevers, cold sx  sts he drinks 2-3 beers daily and last drink was last night  A&O x4... no acute distress.

## 2019-04-04 NOTE — ED Notes (Signed)
Pt urinated on himself. EDP aware.

## 2019-04-05 ENCOUNTER — Observation Stay (HOSPITAL_COMMUNITY): Payer: Medicare Other

## 2019-04-05 ENCOUNTER — Encounter (HOSPITAL_COMMUNITY): Payer: Self-pay | Admitting: Internal Medicine

## 2019-04-05 DIAGNOSIS — N390 Urinary tract infection, site not specified: Secondary | ICD-10-CM | POA: Diagnosis not present

## 2019-04-05 DIAGNOSIS — J90 Pleural effusion, not elsewhere classified: Secondary | ICD-10-CM | POA: Diagnosis not present

## 2019-04-05 DIAGNOSIS — E7800 Pure hypercholesterolemia, unspecified: Secondary | ICD-10-CM | POA: Diagnosis present

## 2019-04-05 DIAGNOSIS — R778 Other specified abnormalities of plasma proteins: Secondary | ICD-10-CM | POA: Diagnosis not present

## 2019-04-05 DIAGNOSIS — E876 Hypokalemia: Secondary | ICD-10-CM | POA: Diagnosis present

## 2019-04-05 DIAGNOSIS — Z880 Allergy status to penicillin: Secondary | ICD-10-CM | POA: Diagnosis not present

## 2019-04-05 DIAGNOSIS — F1010 Alcohol abuse, uncomplicated: Secondary | ICD-10-CM | POA: Diagnosis not present

## 2019-04-05 DIAGNOSIS — I2510 Atherosclerotic heart disease of native coronary artery without angina pectoris: Secondary | ICD-10-CM

## 2019-04-05 DIAGNOSIS — F17210 Nicotine dependence, cigarettes, uncomplicated: Secondary | ICD-10-CM | POA: Diagnosis present

## 2019-04-05 DIAGNOSIS — I200 Unstable angina: Secondary | ICD-10-CM | POA: Diagnosis not present

## 2019-04-05 DIAGNOSIS — R279 Unspecified lack of coordination: Secondary | ICD-10-CM | POA: Diagnosis not present

## 2019-04-05 DIAGNOSIS — Z743 Need for continuous supervision: Secondary | ICD-10-CM | POA: Diagnosis not present

## 2019-04-05 DIAGNOSIS — Z79899 Other long term (current) drug therapy: Secondary | ICD-10-CM | POA: Diagnosis not present

## 2019-04-05 DIAGNOSIS — R58 Hemorrhage, not elsewhere classified: Secondary | ICD-10-CM | POA: Diagnosis not present

## 2019-04-05 DIAGNOSIS — M1712 Unilateral primary osteoarthritis, left knee: Secondary | ICD-10-CM | POA: Diagnosis present

## 2019-04-05 DIAGNOSIS — F1020 Alcohol dependence, uncomplicated: Secondary | ICD-10-CM | POA: Diagnosis not present

## 2019-04-05 DIAGNOSIS — I10 Essential (primary) hypertension: Secondary | ICD-10-CM | POA: Diagnosis present

## 2019-04-05 DIAGNOSIS — M546 Pain in thoracic spine: Secondary | ICD-10-CM | POA: Diagnosis not present

## 2019-04-05 DIAGNOSIS — J9811 Atelectasis: Secondary | ICD-10-CM | POA: Diagnosis not present

## 2019-04-05 DIAGNOSIS — Z9114 Patient's other noncompliance with medication regimen: Secondary | ICD-10-CM | POA: Diagnosis not present

## 2019-04-05 DIAGNOSIS — Z20828 Contact with and (suspected) exposure to other viral communicable diseases: Secondary | ICD-10-CM | POA: Diagnosis present

## 2019-04-05 DIAGNOSIS — F419 Anxiety disorder, unspecified: Secondary | ICD-10-CM | POA: Diagnosis present

## 2019-04-05 DIAGNOSIS — Z8673 Personal history of transient ischemic attack (TIA), and cerebral infarction without residual deficits: Secondary | ICD-10-CM | POA: Diagnosis not present

## 2019-04-05 DIAGNOSIS — R0902 Hypoxemia: Secondary | ICD-10-CM | POA: Diagnosis not present

## 2019-04-05 DIAGNOSIS — R918 Other nonspecific abnormal finding of lung field: Secondary | ICD-10-CM | POA: Diagnosis not present

## 2019-04-05 DIAGNOSIS — I2583 Coronary atherosclerosis due to lipid rich plaque: Secondary | ICD-10-CM | POA: Diagnosis not present

## 2019-04-05 DIAGNOSIS — Z853 Personal history of malignant neoplasm of breast: Secondary | ICD-10-CM | POA: Diagnosis not present

## 2019-04-05 DIAGNOSIS — F10239 Alcohol dependence with withdrawal, unspecified: Secondary | ICD-10-CM | POA: Diagnosis present

## 2019-04-05 DIAGNOSIS — R52 Pain, unspecified: Secondary | ICD-10-CM | POA: Diagnosis present

## 2019-04-05 DIAGNOSIS — R0789 Other chest pain: Secondary | ICD-10-CM | POA: Diagnosis not present

## 2019-04-05 DIAGNOSIS — R410 Disorientation, unspecified: Secondary | ICD-10-CM | POA: Diagnosis not present

## 2019-04-05 DIAGNOSIS — Z951 Presence of aortocoronary bypass graft: Secondary | ICD-10-CM | POA: Diagnosis not present

## 2019-04-05 DIAGNOSIS — J948 Other specified pleural conditions: Secondary | ICD-10-CM | POA: Diagnosis not present

## 2019-04-05 DIAGNOSIS — R079 Chest pain, unspecified: Secondary | ICD-10-CM | POA: Diagnosis not present

## 2019-04-05 DIAGNOSIS — Z8546 Personal history of malignant neoplasm of prostate: Secondary | ICD-10-CM | POA: Diagnosis not present

## 2019-04-05 DIAGNOSIS — Z7951 Long term (current) use of inhaled steroids: Secondary | ICD-10-CM | POA: Diagnosis not present

## 2019-04-05 DIAGNOSIS — Z7982 Long term (current) use of aspirin: Secondary | ICD-10-CM | POA: Diagnosis not present

## 2019-04-05 DIAGNOSIS — J439 Emphysema, unspecified: Secondary | ICD-10-CM | POA: Diagnosis present

## 2019-04-05 DIAGNOSIS — F10229 Alcohol dependence with intoxication, unspecified: Secondary | ICD-10-CM | POA: Diagnosis present

## 2019-04-05 LAB — COMPREHENSIVE METABOLIC PANEL
ALT: 23 U/L (ref 0–44)
AST: 34 U/L (ref 15–41)
Albumin: 3 g/dL — ABNORMAL LOW (ref 3.5–5.0)
Alkaline Phosphatase: 81 U/L (ref 38–126)
Anion gap: 17 — ABNORMAL HIGH (ref 5–15)
BUN: 7 mg/dL — ABNORMAL LOW (ref 8–23)
CO2: 18 mmol/L — ABNORMAL LOW (ref 22–32)
Calcium: 8.2 mg/dL — ABNORMAL LOW (ref 8.9–10.3)
Chloride: 102 mmol/L (ref 98–111)
Creatinine, Ser: 0.87 mg/dL (ref 0.61–1.24)
GFR calc Af Amer: >60 mL/min (ref >60)
GFR calc non Af Amer: >60 mL/min (ref >60)
Glucose, Bld: 72 mg/dL (ref 70–99)
Potassium: 4 mmol/L (ref 3.5–5.1)
Sodium: 137 mmol/L (ref 135–145)
Total Bilirubin: 1.5 mg/dL — ABNORMAL HIGH (ref 0.3–1.2)
Total Protein: 6.1 g/dL — ABNORMAL LOW (ref 6.5–8.1)

## 2019-04-05 LAB — URINALYSIS, ROUTINE W REFLEX MICROSCOPIC
Bilirubin Urine: NEGATIVE
Glucose, UA: NEGATIVE mg/dL
Hgb urine dipstick: NEGATIVE
Ketones, ur: 5 mg/dL — AB
Leukocytes,Ua: NEGATIVE
Nitrite: POSITIVE — AB
Protein, ur: NEGATIVE mg/dL
Specific Gravity, Urine: 1.014 (ref 1.005–1.030)
pH: 6 (ref 5.0–8.0)

## 2019-04-05 LAB — MAGNESIUM: Magnesium: 1.7 mg/dL (ref 1.7–2.4)

## 2019-04-05 LAB — CBC
HCT: 41.8 % (ref 39.0–52.0)
Hemoglobin: 14 g/dL (ref 13.0–17.0)
MCH: 37 pg — ABNORMAL HIGH (ref 26.0–34.0)
MCHC: 33.5 g/dL (ref 30.0–36.0)
MCV: 110.6 fL — ABNORMAL HIGH (ref 80.0–100.0)
Platelets: 208 10*3/uL (ref 150–400)
RBC: 3.78 MIL/uL — ABNORMAL LOW (ref 4.22–5.81)
RDW: 13.3 % (ref 11.5–15.5)
WBC: 9.3 10*3/uL (ref 4.0–10.5)
nRBC: 0 % (ref 0.0–0.2)

## 2019-04-05 LAB — RAPID URINE DRUG SCREEN, HOSP PERFORMED
Amphetamines: NOT DETECTED
Barbiturates: NOT DETECTED
Benzodiazepines: POSITIVE — AB
Cocaine: NOT DETECTED
Opiates: NOT DETECTED
Tetrahydrocannabinol: NOT DETECTED

## 2019-04-05 LAB — LACTIC ACID, PLASMA: Lactic Acid, Venous: 1 mmol/L (ref 0.5–1.9)

## 2019-04-05 LAB — PROTIME-INR
INR: 1 (ref 0.8–1.2)
Prothrombin Time: 12.9 seconds (ref 11.4–15.2)

## 2019-04-05 LAB — PHOSPHORUS: Phosphorus: 4 mg/dL (ref 2.5–4.6)

## 2019-04-05 LAB — APTT: aPTT: 23 seconds — ABNORMAL LOW (ref 24–36)

## 2019-04-05 LAB — TROPONIN I (HIGH SENSITIVITY): Troponin I (High Sensitivity): 29 ng/L — ABNORMAL HIGH (ref <18)

## 2019-04-05 LAB — SARS CORONAVIRUS 2 (TAT 6-24 HRS): SARS Coronavirus 2: NEGATIVE

## 2019-04-05 LAB — ETHANOL: Alcohol, Ethyl (B): <10 mg/dL (ref <10)

## 2019-04-05 MED ORDER — ASPIRIN 81 MG PO CHEW: 324.0000 mg | CHEWABLE_TABLET | Freq: Once | ORAL | Status: DC

## 2019-04-05 MED ORDER — ALBUTEROL SULFATE (2.5 MG/3ML) 0.083% IN NEBU: 3.0000 mL | INHALATION_SOLUTION | Freq: Four times a day (QID) | RESPIRATORY_TRACT | Status: DC | PRN

## 2019-04-05 MED ORDER — ENOXAPARIN SODIUM 40 MG/0.4ML ~~LOC~~ SOLN: 40.0000 mg | SUBCUTANEOUS | Status: DC

## 2019-04-05 MED ORDER — LORAZEPAM 1 MG PO TABS: 1.0000 mg | ORAL_TABLET | ORAL | Status: AC | PRN

## 2019-04-05 MED ORDER — ASPIRIN EC 81 MG PO TBEC
81.0000 mg | DELAYED_RELEASE_TABLET | Freq: Every day | ORAL | Status: DC
  Administered 2019-04-05 – 2019-04-13 (×7): 81 mg via ORAL
  Filled 2019-04-05 (×7): qty 1

## 2019-04-05 MED ORDER — UMECLIDINIUM BROMIDE 62.5 MCG/INH IN AEPB
1.0000 | INHALATION_SPRAY | Freq: Every day | RESPIRATORY_TRACT | Status: DC
  Administered 2019-04-06: 1 via RESPIRATORY_TRACT
  Filled 2019-04-05: qty 7

## 2019-04-05 MED ORDER — CIPROFLOXACIN IN D5W 400 MG/200ML IV SOLN
400.0000 mg | Freq: Once | INTRAVENOUS | Status: AC
  Administered 2019-04-08: 400 mg via INTRAVENOUS
  Filled 2019-04-05: qty 200

## 2019-04-05 MED ORDER — PANTOPRAZOLE SODIUM 40 MG PO TBEC
40.0000 mg | DELAYED_RELEASE_TABLET | Freq: Every day | ORAL | Status: DC
  Administered 2019-04-09: 40 mg via ORAL
  Filled 2019-04-05: qty 1

## 2019-04-05 MED ORDER — ONDANSETRON HCL 4 MG/2ML IJ SOLN: 4.0000 mg | Freq: Four times a day (QID) | INTRAMUSCULAR | Status: DC | PRN

## 2019-04-05 MED ORDER — ADULT MULTIVITAMIN W/MINERALS CH
1.0000 | ORAL_TABLET | Freq: Every day | ORAL | Status: DC
  Administered 2019-04-05 – 2019-04-13 (×4): 1 via ORAL
  Filled 2019-04-05 (×4): qty 1

## 2019-04-05 MED ORDER — ACETAMINOPHEN 325 MG PO TABS
650.0000 mg | ORAL_TABLET | ORAL | Status: DC | PRN
  Administered 2019-04-12 (×2): 650 mg via ORAL
  Filled 2019-04-05 (×2): qty 2

## 2019-04-05 MED ORDER — LISINOPRIL 10 MG PO TABS
10.0000 mg | ORAL_TABLET | Freq: Every day | ORAL | Status: DC
  Administered 2019-04-05 – 2019-04-09 (×3): 10 mg via ORAL
  Filled 2019-04-05 (×3): qty 1

## 2019-04-05 MED ORDER — THIAMINE HCL 100 MG/ML IJ SOLN
100.0000 mg | Freq: Every day | INTRAMUSCULAR | Status: DC
  Administered 2019-04-05 – 2019-04-07 (×3): 100 mg via INTRAVENOUS
  Filled 2019-04-05 (×3): qty 2

## 2019-04-05 MED ORDER — FOLIC ACID 1 MG PO TABS
1.0000 mg | ORAL_TABLET | Freq: Every day | ORAL | Status: DC
  Administered 2019-04-05 – 2019-04-13 (×4): 1 mg via ORAL
  Filled 2019-04-05 (×4): qty 1

## 2019-04-05 MED ORDER — CIPROFLOXACIN IN D5W 400 MG/200ML IV SOLN
400.0000 mg | Freq: Two times a day (BID) | INTRAVENOUS | Status: DC
  Administered 2019-04-05 – 2019-04-07 (×5): 400 mg via INTRAVENOUS
  Filled 2019-04-05 (×6): qty 200

## 2019-04-05 MED ORDER — VITAMIN B-1 100 MG PO TABS
100.0000 mg | ORAL_TABLET | Freq: Every day | ORAL | Status: DC
  Administered 2019-04-11 – 2019-04-13 (×3): 100 mg via ORAL
  Filled 2019-04-05 (×3): qty 1

## 2019-04-05 MED ORDER — ATORVASTATIN CALCIUM 40 MG PO TABS
40.0000 mg | ORAL_TABLET | Freq: Every day | ORAL | Status: DC
  Administered 2019-04-05 – 2019-04-13 (×8): 40 mg via ORAL
  Filled 2019-04-05 (×8): qty 1

## 2019-04-05 MED ORDER — MOMETASONE FURO-FORMOTEROL FUM 100-5 MCG/ACT IN AERO
2.0000 | INHALATION_SPRAY | Freq: Two times a day (BID) | RESPIRATORY_TRACT | Status: DC
  Administered 2019-04-06 – 2019-04-13 (×12): 2 via RESPIRATORY_TRACT
  Filled 2019-04-05: qty 8.8

## 2019-04-05 MED ORDER — LORAZEPAM 2 MG/ML IJ SOLN
1.0000 mg | INTRAMUSCULAR | Status: AC | PRN
  Administered 2019-04-05: 4 mg via INTRAVENOUS
  Administered 2019-04-05: 3 mg via INTRAVENOUS
  Administered 2019-04-05: 2 mg via INTRAVENOUS
  Administered 2019-04-06 (×3): 4 mg via INTRAVENOUS
  Administered 2019-04-06 (×2): 2 mg via INTRAVENOUS
  Administered 2019-04-06: 4 mg via INTRAVENOUS
  Administered 2019-04-06: 3 mg via INTRAVENOUS
  Administered 2019-04-06: 2 mg via INTRAVENOUS
  Administered 2019-04-06: 3 mg via INTRAVENOUS
  Administered 2019-04-06 (×2): 2 mg via INTRAVENOUS
  Administered 2019-04-06: 4 mg via INTRAVENOUS
  Administered 2019-04-07 (×2): 1 mg via INTRAVENOUS
  Administered 2019-04-07: 3 mg via INTRAVENOUS
  Administered 2019-04-07: 2 mg via INTRAVENOUS
  Administered 2019-04-07 (×4): 1 mg via INTRAVENOUS
  Administered 2019-04-07 – 2019-04-08 (×3): 2 mg via INTRAVENOUS
  Filled 2019-04-05: qty 2
  Filled 2019-04-05 (×4): qty 1
  Filled 2019-04-05: qty 2
  Filled 2019-04-05: qty 1
  Filled 2019-04-05 (×2): qty 2
  Filled 2019-04-05: qty 1
  Filled 2019-04-05: qty 2
  Filled 2019-04-05 (×6): qty 1
  Filled 2019-04-05: qty 2
  Filled 2019-04-05: qty 1
  Filled 2019-04-05 (×3): qty 2
  Filled 2019-04-05: qty 1
  Filled 2019-04-05: qty 2
  Filled 2019-04-05 (×2): qty 1

## 2019-04-05 NOTE — Progress Notes (Signed)
Pharmacy Antibiotic Note  [name] Pace is a 68 y.o. male admitted on 04/04/2019 with UTI.  Pharmacy has been consulted for Cipro dosing.  Plan: Ciprofloxacin 400mg  IV Q12H.  Height: 6\' 3"  (190.5 cm) Weight: 139 lb 15.9 oz (63.5 kg) IBW/kg (Calculated) : 84.5  Temp (24hrs), Avg:98.2 F (36.8 C), Min:97.9 F (36.6 C), Max:98.5 F (36.9 C)  Recent Labs  Lab 04/03/19 0302 04/04/19 1930 04/04/19 1939 04/05/19 0236 04/05/19 0525  WBC  --   --  12.1*  --  9.3  CREATININE 0.58*  --  0.83  --   --   LATICACIDVEN  --  2.3*  --  1.0  --     Estimated Creatinine Clearance: 78.6 mL/min (by C-G formula based on SCr of 0.83 mg/dL).    Allergies  Allergen Reactions  . Penicillins Anaphylaxis    Did it involve swelling of the face/tongue/throat, SOB, or low BP? Yes Did it involve sudden or severe rash/hives, skin peeling, or any reaction on the inside of your mouth or nose? No Did you need to seek medical attention at a hospital or doctor's office? No When did it last happen?childhood If all above answers are "NO", may proceed with cephalosporin use.      Thank you for allowing pharmacy to be a part of this patient's care.  Wynona Neat, PharmD, BCPS  04/05/2019 6:21 AM

## 2019-04-05 NOTE — ED Notes (Signed)
Need to recollect PT/INR

## 2019-04-05 NOTE — ED Notes (Signed)
ED TO INPATIENT HANDOFF REPORT  ED Nurse Name and Phone #: [phone]  S Name/Age/Gender [name] Pace 68 y.o. male Room/Bed: H019C/H019C  Code Status   Code Status: Full Code  Home/SNF/Other Home Patient oriented to: self and place  Is this baseline? No   Triage Complete: Triage complete  Chief Complaint N/V, CP  Triage Note Pt arrived via [facility] c/o CP onset last night associated w/vomiting/nausea, dizziness, weakness and thirsty  Denies Inj/trauma, SOB, fevers, cold sx  sts he drinks 2-3 beers daily and last drink was last night  A&O x4... no acute distress.    Allergies Allergies  Allergen Reactions  . Penicillins Anaphylaxis    Did it involve swelling of the face/tongue/throat, SOB, or low BP? Yes Did it involve sudden or severe rash/hives, skin peeling, or any reaction on the inside of your mouth or nose? No Did you need to seek medical attention at a hospital or doctor's office? No When did it last happen?childhood If all above answers are "NO", may proceed with cephalosporin use.     Level of Care/Admitting Diagnosis ED Disposition    ED Disposition Condition Comment   Admit  Hospital Area: [place] [[id number]]  Level of Care: Telemetry Medical [104]  I expect the patient will be discharged within 24 hours: No (not a candidate for 5C-Observation unit)  Covid Evaluation: Asymptomatic Screening Protocol (No Symptoms)  Diagnosis: Chest pain [id number]  Admitting Physician: Rise Patience [phone]  Attending Physician: Rise Patience [[id number]]  PT Class (Do Not Modify): Observation [104]  PT Acc Code (Do Not Modify): Observation [10022]       B Medical/Surgery History Past Medical History:  Diagnosis Date  . Alcohol abuse   . Anxiety   . Breast cancer ([place]) Left breast   Per patient diagnosed in early 54's.   . Cocaine abuse ([place]) 08/19/2018  . Coronary artery disease   . High cholesterol   . Left rib fracture  08/19/2018  . Prostate cancer ([place])    per patient diagnosed in early 45's  . Stroke [facility])    Past Surgical History:  Procedure Laterality Date  . CAROTID STENT    . CORONARY ARTERY BYPASS GRAFT    . FEMUR IM NAIL Left 07/02/2014   Procedure: INTRAMEDULLARY (IM) NAIL FEMORAL;  Surgeon: Elie Goody, MD;  Location: [facility];  Service: Orthopedics;  Laterality: Left;  . I&D EXTREMITY Right 02/10/2016   Procedure: IRRIGATION AND DEBRIDEMENT EXTREMITY;  Surgeon: Dorna Leitz, MD;  Location: [place];  Service: Orthopedics;  Laterality: Right;  . TENDON REPAIR Right 02/10/2016   Procedure: Anterior TENDON REPAIR and wound exploration.;  Surgeon: Dorna Leitz, MD;  Location: [facility];  Service: Orthopedics;  Laterality: Right;     A IV Location/Drains/Wounds Patient Lines/Drains/Airways Status   Active Line/Drains/Airways    Name:   Placement date:   Placement time:   Site:   Days:   Peripheral IV 04/04/19 Left;Medial;Upper Arm   04/04/19    2033    Arm   1   Wound / Incision (Open or Dehisced) 05/29/18 Puncture Back Right   05/29/18    1200    Back   311   Wound / Incision (Open or Dehisced) 08/19/18 Thigh Right;Medial small abrasion   08/19/18    0430    Thigh   229          Intake/Output Last 24 hours  Intake/Output Summary (Last 24 hours) at 04/05/2019 0529 Last  data filed at 04/04/2019 2244 Gross per 24 hour  Intake 1000 ml  Output -  Net 1000 ml    Labs/Imaging Results for orders placed or performed during the hospital encounter of 04/04/19 (from the past 48 hour(s))  Lactic acid, plasma     Status: Abnormal   Collection Time: 04/04/19  7:30 PM  Result Value Ref Range   Lactic Acid, Venous 2.3 (HH) 0.5 - 1.9 mmol/L    Comment: CRITICAL VALUE NOTED.  VALUE IS CONSISTENT WITH PREVIOUSLY REPORTED AND CALLED VALUE. Performed at [facility], [place] [address], [place], [place] [address]   CBC     Status: Abnormal   Collection Time: 04/04/19  7:39 PM  Result Value Ref Range    WBC 12.1 (H) 4.0 - 10.5 K/uL   RBC 4.52 4.22 - 5.81 MIL/uL   Hemoglobin 16.7 13.0 - 17.0 g/dL   HCT 47.7 39.0 - 52.0 %   MCV 105.5 (H) 80.0 - 100.0 fL   MCH 36.9 (H) 26.0 - 34.0 pg   MCHC 35.0 30.0 - 36.0 g/dL   RDW 13.2 11.5 - 15.5 %   Platelets 204 150 - 400 K/uL   nRBC 0.0 0.0 - 0.2 %    Comment: Performed at [facility], [place] [address], [place], [place] [address]  Troponin I (High Sensitivity)     Status: Abnormal   Collection Time: 04/04/19  7:39 PM  Result Value Ref Range   Troponin I (High Sensitivity) 20 (H) <18 ng/L    Comment: (NOTE) Elevated high sensitivity troponin I (hsTnI) values and significant  changes across serial measurements may suggest ACS but many other  chronic and acute conditions are known to elevate hsTnI results.  Refer to the "Links" section for chest pain algorithms and additional  guidance. Performed at [facility], [place] [address], [place], [place] [address]   Comprehensive metabolic panel     Status: Abnormal   Collection Time: 04/04/19  7:39 PM  Result Value Ref Range   Sodium 137 135 - 145 mmol/L   Potassium 4.1 3.5 - 5.1 mmol/L   Chloride 99 98 - 111 mmol/L   CO2 20 (L) 22 - 32 mmol/L   Glucose, Bld 124 (H) 70 - 99 mg/dL   BUN 5 (L) 8 - 23 mg/dL   Creatinine, Ser 0.83 0.61 - 1.24 mg/dL   Calcium 9.2 8.9 - 10.3 mg/dL   Total Protein 7.4 6.5 - 8.1 g/dL   Albumin 4.1 3.5 - 5.0 g/dL   AST 49 (H) 15 - 41 U/L   ALT 31 0 - 44 U/L   Alkaline Phosphatase 104 38 - 126 U/L   Total Bilirubin 2.2 (H) 0.3 - 1.2 mg/dL   GFR calc non Af Amer >60 >60 mL/min   GFR calc Af Amer >60 >60 mL/min   Anion gap 18 (H) 5 - 15    Comment: Performed at [place] [address] [place], [place] [address]  Protime-INR     Status: None   Collection Time: 04/04/19  7:39 PM  Result Value Ref Range   Prothrombin Time 12.3 11.4 - 15.2 seconds    Comment: QUESTIONABLE RESULTS, RECOMMEND RECOLLECT TO VERIFY SPECIMEN CLOTTED L.LEANARD RN 2134  04/04/2019 MCCORMICK K    INR 0.9 0.8 - 1.2    Comment: QUESTIONABLE RESULTS, RECOMMEND RECOLLECT TO VERIFY SPECIMEN CLOTTED L.LEANARD RN 2134 04/04/2019 MCCORMICK K (NOTE) INR goal varies based on device and disease  states. Performed at [facility], [place] [address] [place], [place] [address]   Lipase, blood     Status: None   Collection Time: 04/04/19  7:39 PM  Result Value Ref Range   Lipase 24 11 - 51 U/L    Comment: Performed at [facility], [place] [address], [place], [place] [address]  Ammonia     Status: None   Collection Time: 04/04/19  7:39 PM  Result Value Ref Range   Ammonia 24 9 - 35 umol/L    Comment: Performed at [facility], [place] [address], [place], [place] [address]  SARS CORONAVIRUS 2 (TAT 6-24 HRS) Nasopharyngeal Nasopharyngeal Swab     Status: None   Collection Time: 04/04/19 10:27 PM   Specimen: Nasopharyngeal Swab  Result Value Ref Range   SARS Coronavirus 2 NEGATIVE NEGATIVE    Comment: (NOTE) SARS-CoV-2 target nucleic acids are NOT DETECTED. The SARS-CoV-2 RNA is generally detectable in upper and lower respiratory specimens during the acute phase of infection. Negative results do not preclude SARS-CoV-2 infection, do not rule out co-infections with other pathogens, and should not be used as the sole basis for treatment or other patient management decisions. Negative results must be combined with clinical observations, patient history, and epidemiological information. The expected result is Negative. Fact Sheet for Patients: SugarRoll.be Fact Sheet for Healthcare Providers: https://www.woods-mathews.com/ This test is not yet approved or cleared by the [place] FDA and  has been authorized for detection and/or diagnosis of SARS-CoV-2 by FDA under an Emergency Use Authorization (EUA). This EUA will remain  in effect (meaning this test can be used) for the duration of the COVID-19 declaration  under Section 56 4(b)(1) of the Act, 21 U.S.C. section 360bbb-3(b)(1), unless the authorization is terminated or revoked sooner. Performed at [facility], [place] [address] [place], [place] [address]   Lactic acid, plasma     Status: None   Collection Time: 04/05/19  2:36 AM  Result Value Ref Range   Lactic Acid, Venous 1.0 0.5 - 1.9 mmol/L    Comment: Performed at [facility] [address], [place],  [address]  Urinalysis, Routine w reflex microscopic     Status: Abnormal   Collection Time: 04/05/19  2:36 AM  Result Value Ref Range   Color, Urine YELLOW YELLOW   APPearance CLEAR CLEAR   Specific Gravity, Urine 1.014 1.005 - 1.030   pH 6.0 5.0 - 8.0   Glucose, UA NEGATIVE NEGATIVE mg/dL   Hgb urine dipstick NEGATIVE NEGATIVE   Bilirubin Urine NEGATIVE NEGATIVE   Ketones, ur 5 (A) NEGATIVE mg/dL   Protein, ur NEGATIVE NEGATIVE mg/dL   Nitrite POSITIVE (A) NEGATIVE   Leukocytes,Ua NEGATIVE NEGATIVE   RBC / HPF 0-5 0 - 5 RBC/hpf   WBC, UA 6-10 0 - 5 WBC/hpf   Bacteria, UA RARE (A) NONE SEEN   Squamous Epithelial / LPF 0-5 0 - 5   Mucus PRESENT     Comment: Performed at [facility], [address] [place], [place] [address]  Troponin I (High Sensitivity)     Status: Abnormal   Collection Time: 04/05/19  2:36 AM  Result Value Ref Range   Troponin I (High Sensitivity) 29 (H) <18 ng/L    Comment: (NOTE) Elevated high sensitivity troponin I (hsTnI) values and significant  changes across serial measurements may suggest ACS but many other  chronic and acute conditions are known to elevate hsTnI  results.  Refer to the "Links" section for chest pain algorithms and additional  guidance. Performed at [facility], [place] [address], [place], [place] [address]   Ethanol     Status: None   Collection Time: 04/05/19  2:36 AM  Result Value Ref Range   Alcohol, Ethyl (B) <10 <10 mg/dL    Comment: (NOTE) Lowest detectable limit for serum alcohol is 10  mg/dL. For medical purposes only. Performed at [facility], [place] [address], [place], [place] [address]   Protime-INR     Status: None   Collection Time: 04/05/19  2:36 AM  Result Value Ref Range   Prothrombin Time 12.9 11.4 - 15.2 seconds   INR 1.0 0.8 - 1.2    Comment: (NOTE) INR goal varies based on device and disease states. Performed at [facility], [place] [address], [place], [place] [address]   APTT     Status: Abnormal   Collection Time: 04/05/19  2:36 AM  Result Value Ref Range   aPTT 23 (L) 24 - 36 seconds    Comment: Performed at [facility] [address] [place], [place] [address]   Dg Chest 2 View  Result Date: 04/04/2019 CLINICAL DATA:  68 year old male with chest pain. EXAM: CHEST - 2 VIEW COMPARISON:  Chest radiograph dated 04/03/2019 FINDINGS: Shallow inspiration with minimal bibasilar atelectasis. No focal consolidation, pleural effusion, pneumothorax. Right pleural thickening and density at the right costophrenic angle, likely scarring related to prior rib fractures. Mild cardiomegaly. Median sternotomy wires and CABG vascular clips. Atherosclerotic calcification of the aorta. IMPRESSION: 1. No active cardiopulmonary disease.  No interval change. 2. Mild cardiomegaly. Electronically Signed   By: Anner Crete M.D.   On: 04/04/2019 19:22   Ct Head Wo Contrast  Result Date: 04/04/2019 CLINICAL DATA:  Altered level of consciousness with nausea and dizziness EXAM: CT HEAD WITHOUT CONTRAST TECHNIQUE: Contiguous axial images were obtained from the base of the skull through the vertex without intravenous contrast. COMPARISON:  11/23/2018 FINDINGS: Brain: Right basal ganglia lacunar infarct is again seen. Mild atrophic changes and chronic ischemic changes are noted. Stable remote right thalamic infarct is noted. No findings to suggest acute hemorrhage, acute infarction or space-occupying mass lesion are noted. Vascular: No hyperdense vessel or unexpected  calcification. Skull: Normal. Negative for fracture or focal lesion. Sinuses/Orbits: No acute finding. Other: None IMPRESSION: Chronic lacunar infarcts as described. No acute abnormality is noted. Stable atrophy and white matter ischemic change. Electronically Signed   By: Inez Catalina M.D.   On: 04/04/2019 21:16   Ct Abdomen Pelvis W Contrast  Result Date: 04/04/2019 CLINICAL DATA:  Nausea and vomiting EXAM: CT ABDOMEN AND PELVIS WITH CONTRAST TECHNIQUE: Multidetector CT imaging of the abdomen and pelvis was performed using the standard protocol following bolus administration of intravenous contrast. CONTRAST:  135mL OMNIPAQUE IOHEXOL 300 MG/ML  SOLN COMPARISON:  CT 08/19/2018 FINDINGS: Lower chest: Lung bases demonstrate scarring or atelectasis at the posterior lung bases. Emphysema. No acute consolidation or pleural effusion. Borderline heart size. Coronary vascular calcification Hepatobiliary: No focal liver abnormality is seen. Status post cholecystectomy. No biliary dilatation. Pancreas: Unremarkable. No pancreatic ductal dilatation or surrounding inflammatory changes. Spleen: Normal in size without focal abnormality. Adrenals/Urinary Tract: Adrenal glands are unremarkable. Kidneys are normal, without renal calculi, focal lesion, or hydronephrosis. Bladder is slightly thick walled. Stomach/Bowel: The stomach is nonenlarged. No dilated small bowel. Negative for colon wall thickening. Negative appendix. Vascular/Lymphatic: Heavily calcified aorta.  No aneurysm. No significantly enlarged lymph nodes. Reproductive: No masses.  Status post prostatectomy. Other: Negative for free air or free fluid. Musculoskeletal: No acute or suspicious osseous abnormality. Intramedullary rodding left femur. Stable lucency at the tip of the femoral component without femoral head breech or collapse. IMPRESSION: 1. No CT evidence for acute intra-abdominal or pelvic abnormality. 2. Slightly thick-walled appearance of the urinary  bladder, nonspecific, questionable for cystitis 3. Emphysema Electronically Signed   By: Donavan Foil M.D.   On: 04/04/2019 21:21    Pending Labs Unresulted Labs (From admission, onward)    Start     Ordered   04/12/19 0500  Creatinine, serum  (enoxaparin (LOVENOX)    CrCl >/= 30 ml/min)  Weekly,   R    Comments: while on enoxaparin therapy    04/05/19 0503   04/05/19 0503  Comprehensive metabolic panel  Once,   STAT     04/05/19 0503   04/05/19 0503  Magnesium  Once,   STAT     04/05/19 0503   04/05/19 0503  Phosphorus  Once,   STAT     04/05/19 0503   04/05/19 0503  CBC  Once,   STAT     04/05/19 0503   04/04/19 1855  Blood Culture (routine x 2)  BLOOD CULTURE X 2,   STAT     04/04/19 1856   04/04/19 1855  Urine culture  ONCE - STAT,   STAT     04/04/19 1856          Vitals/Pain Today's Vitals   04/05/19 0030 04/05/19 0321 04/05/19 0413 04/05/19 0526  BP: 134/86   (!) 179/88  Pulse: 82   75  Resp:    16  Temp:    98.3 F (36.8 C)  TempSrc:    Rectal  SpO2:    100%  PainSc:  0-No pain 0-No pain     Isolation Precautions No active isolations  Medications Medications  aspirin chewable tablet 324 mg (has no administration in time range)  aspirin EC tablet 81 mg (has no administration in time range)  atorvastatin (LIPITOR) tablet 40 mg (has no administration in time range)  pantoprazole (PROTONIX) EC tablet 40 mg (has no administration in time range)  albuterol (PROVENTIL) (2.5 MG/3ML) 0.083% nebulizer solution 3 mL (has no administration in time range)  mometasone-formoterol (DULERA) 100-5 MCG/ACT inhaler 2 puff (has no administration in time range)  tiotropium (SPIRIVA) inhalation capsule ([facility] use ONLY) 18 mcg (has no administration in time range)  LORazepam (ATIVAN) tablet 1-4 mg (has no administration in time range)    Or  LORazepam (ATIVAN) injection 1-4 mg (has no administration in time range)  thiamine (VITAMIN B-1) tablet 100 mg (has no administration in  time range)    Or  thiamine (B-1) injection 100 mg (has no administration in time range)  folic acid (FOLVITE) tablet 1 mg (has no administration in time range)  multivitamin with minerals tablet 1 tablet (has no administration in time range)  acetaminophen (TYLENOL) tablet 650 mg (has no administration in time range)  ondansetron (ZOFRAN) injection 4 mg (has no administration in time range)  enoxaparin (LOVENOX) injection 40 mg (has no administration in time range)  sodium chloride flush (NS) 0.9 % injection 3 mL (3 mLs Intravenous Given 04/05/19 0008)  thiamine (B-1) injection 100 mg (100 mg Intravenous Given 04/04/19 2329)  iohexol (OMNIPAQUE) 300 MG/ML solution 100 mL (100 mLs Intravenous Contrast Given 04/04/19 2057)  sodium chloride 0.9 %  bolus 1,000 mL (0 mLs Intravenous Stopped 04/04/19 2244)    Mobility walks with person assist High fall risk   Focused Assessments Cardiac Assessment Handoff:  Cardiac Rhythm: Normal sinus rhythm Lab Results  Component Value Date   CKTOTAL 52 08/19/2018   CKMB 5.8 (H) 11/16/2010   TROPONINI <0.03 11/23/2018   Lab Results  Component Value Date   DDIMER 0.78 (H) 12/01/2018   Does the Patient currently have chest pain? No     R Recommendations: See Admitting Provider Note  Report given to:   Additional Notes: -

## 2019-04-05 NOTE — Progress Notes (Signed)
67 year old gentleman with a history of CAD status post CABG, polysubstance abuse, prostate cancer and COPD who was admitted early this morning (please review H&P for details) with chest pain.  He also has a history of alcohol dependence.  Chest x-ray unremarkable.  CT of the head unremarkable.  EKG with no acute ST-T wave changes.  Troponin XX followed by 107.  Lactic acidosis which has improved now.  UA shows positive nitrites.  Patient seen and examined.  He is very poor historian.  Although seems to be alert but wants to sleep and does not want to talk much.  Per nurses, they were informed that he lives in assisted living facility but patient tells [place] that he lives at home with his girlfriend.  He has no complaints at this point in time he denied any chest pain or shortness of breath.  Does not seem to have any alcohol withdrawal symptoms either.  On examination he is comfortable.  Lungs clear to auscultation.  Abdomen soft and nontender.  No focal deficit on neuro exam.  He was also seen by cardiology right before me and the plan on doing nuclear stress test in the morning for which she will be n.p.o. from midnight.  UDS positive for benzodiazepines.  His blood pressure is stable at this point in time.

## 2019-04-05 NOTE — Consult Note (Signed)
Cardiology Consultation:   Patient ID: [name] Pace MRN: [id number]; DOB: 09-23-1950  Admit date: 04/04/2019 Date of Consult: 04/05/2019  Primary Care Provider: Patient, No Pcp Per Primary Cardiologist: No primary care provider on file.  Primary Electrophysiologist:  None    Patient Profile:   [name] Pace is a 68 y.o. male with a hx of CAD s/p CABG, chronic alcohol abuse, COPD, cocaine abuse, chronic tobacco abuse, and previous CVA who is being seen today for the evaluation of chest pain at the request of Dr. Doristine Bosworth.  History of Present Illness:   [name] Pace has not see Heart care in the past. He is a poor historian. He has a reported history of CAD s/p CABG.  There are no records available in Epic or Care everywhere. The patient has multiple ER visits. The last admission was 12/01/18- 12/02/18 for a COPD exacerbation. During that admission echo was ordered which showed EF 55%, pseudonormalization, moderately dilated LA, mild dilation of aortic root 41 mm. He does not regularly see a doctor.   He presented to the ED on 04/04/19 for chest pain. On my interview the patient cannot say when the chest pain started or what he was doing. The pain is a sharp in nature and located in the sternal area, 10/10. Denies associated nausea, vomiting, or diaphoresis. No radiation. Pain is worse with coughing and taking a deep breath. Pain is not worse on exertion. He has never felt this pain before.  In the ED BP 152/87, pulse 64, temp 98.5, RR 16. Labs revealed sodium 137, potassium 4.1, glucose 124, creatinine 0.83, Magnesium 1.7. Albumin 3.0. WBC 12.1, Hgb 16.7. Hs troponin 20 > 29. CXR with mild cardiomegaly but otherwise unremarkable. Lactate was elevated 2.3 on arrival, but improved to 2.1 with fluids. He was noted to have AMS on arrival and CT head was unremarkable.  UA was concerning for infection. CT abdomen showed urinary bladder thickening. UDS was positive for Benzodiazepines. Thoracic spine ray  showed compression fractures with no acute abnormality. He was admitted for further work-up.   Patient smoke 1ppd and drinks 2-3 beers every other day. He is not very cooperative on exam.   Heart Pathway Score:     Past Medical History:  Diagnosis Date   Alcohol abuse    Anxiety    Breast cancer ([place]) Left breast   Per patient diagnosed in early 109's.    Cocaine abuse ([place]) 08/19/2018   Coronary artery disease    High cholesterol    Left rib fracture 08/19/2018   Prostate cancer ([place])    per patient diagnosed in early 50's   Stroke [facility])     Past Surgical History:  Procedure Laterality Date   CAROTID STENT     CORONARY ARTERY BYPASS GRAFT     FEMUR IM NAIL Left 07/02/2014   Procedure: INTRAMEDULLARY (IM) NAIL FEMORAL;  Surgeon: Elie Goody, MD;  Location: [facility];  Service: Orthopedics;  Laterality: Left;   I&D EXTREMITY Right 02/10/2016   Procedure: IRRIGATION AND DEBRIDEMENT EXTREMITY;  Surgeon: Dorna Leitz, MD;  Location: [place];  Service: Orthopedics;  Laterality: Right;   TENDON REPAIR Right 02/10/2016   Procedure: Anterior TENDON REPAIR and wound exploration.;  Surgeon: Dorna Leitz, MD;  Location: [place];  Service: Orthopedics;  Laterality: Right;     Home Medications:  Prior to Admission medications   Medication Sig Start Date End Date Taking? Authorizing Provider  hydrOXYzine (ATARAX/VISTARIL) 25 MG tablet Take 1 tablet (25 mg  total) by mouth every 6 (six) hours as needed for anxiety (or CIWA score </= 10). 12/02/18  Yes Eugenie Filler, MD  acetaminophen (TYLENOL) 500 MG tablet Take 500-1,000 mg by mouth every 6 (six) hours as needed for moderate pain.    [provider]  albuterol (VENTOLIN HFA) 108 (90 Base) MCG/ACT inhaler Inhale 2 puffs into the lungs every 6 (six) hours as needed for wheezing or shortness of breath. Use 2 puffs 3 times daily x5 days, then every 6 hours as needed. 12/02/18   Eugenie Filler, MD  aspirin EC 81 MG tablet Take  81 mg by mouth daily.    [provider]  atorvastatin (LIPITOR) 40 MG tablet Take 1 tablet (40 mg total) by mouth daily at 6 PM. 12/02/18   Eugenie Filler, MD  cholecalciferol (VITAMIN D3) 25 MCG (1000 UT) tablet Take 1,000 Units by mouth daily.    [provider]  fluticasone (FLONASE) 50 MCG/ACT nasal spray Place 2 sprays into both nostrils daily. 12/03/18   Eugenie Filler, MD  folic acid (FOLVITE) 1 MG tablet Take 1 tablet (1 mg total) by mouth daily. Patient not taking: Reported on 08/20/2018 06/09/18   Debbe Odea, MD  lidocaine (LIDODERM) 5 % Place 2 patches onto the skin daily. Remove & Discard patch within 12 hours or as directed by MD Patient not taking: Reported on 08/20/2018 06/09/18   Debbe Odea, MD  loratadine (CLARITIN) 10 MG tablet Take 1 tablet (10 mg total) by mouth daily. 12/03/18   Eugenie Filler, MD  mometasone-formoterol (DULERA) 100-5 MCG/ACT AERO Inhale 2 puffs into the lungs 2 (two) times daily. 12/02/18   Eugenie Filler, MD  Multiple Vitamin (MULTIVITAMIN WITH MINERALS) TABS tablet Take 1 tablet by mouth daily. 12/03/18   Eugenie Filler, MD  nicotine (NICODERM CQ - DOSED IN MG/24 HOURS) 14 mg/24hr patch Place 1 patch (14 mg total) onto the skin daily. 12/03/18   Eugenie Filler, MD  OLANZapine (ZYPREXA) 2.5 MG tablet Take 2 tablets (5 mg total) by mouth at bedtime. Patient not taking: Reported on 08/20/2018 06/09/18   Debbe Odea, MD  OLANZapine (ZYPREXA) 5 MG tablet Take 0.5 tablets (2.5 mg total) by mouth daily as needed (agitation). Patient not taking: Reported on 08/20/2018 06/09/18   Debbe Odea, MD  pantoprazole (PROTONIX) 40 MG tablet Take 1 tablet (40 mg total) by mouth daily at 6 (six) AM. 12/03/18   Eugenie Filler, MD  polyethylene glycol [facility] / Floria Raveling) packet Take 17 g by mouth daily as needed for moderate constipation or severe constipation. Patient not taking: Reported on 08/20/2018 06/09/18   Debbe Odea, MD  potassium  chloride (KLOR-CON) 10 MEQ tablet Take 1 tablet (10 mEq total) by mouth daily. 04/03/19   Tacy Learn, PA-C  predniSONE (DELTASONE) 20 MG tablet Take 1-2 tablets (20-40 mg total) by mouth daily with breakfast. Take 2 tablets (40mg ) daily x3 days, then 1 tablet (20mg ) daily x3 days then stop 12/03/18   Eugenie Filler, MD  thiamine 100 MG tablet Take 1 tablet (100 mg total) by mouth daily. 12/03/18   Eugenie Filler, MD  tiotropium (SPIRIVA HANDIHALER) 18 MCG inhalation capsule Place 1 capsule (18 mcg total) into inhaler and inhale daily for 30 days. 12/02/18 01/01/19  Eugenie Filler, MD  traMADol (ULTRAM) 50 MG tablet Take 1 tablet (50 mg total) by mouth every 6 (six) hours as needed. Patient not taking: Reported on 12/01/2018 11/23/18  Jola Schmidt, MD  vitamin B-12 1000 MCG tablet Take 1 tablet (1,000 mcg total) by mouth daily. Patient not taking: Reported on 08/20/2018 06/09/18   Debbe Odea, MD  vitamin C (ASCORBIC ACID) 500 MG tablet Take 500 mg by mouth daily.    [provider]    Inpatient Medications: Scheduled Meds:  aspirin  324 mg Oral Once   aspirin EC  81 mg Oral Daily   atorvastatin  40 mg Oral q1800   enoxaparin (LOVENOX) injection  40 mg Subcutaneous [id number]   folic acid  1 mg Oral Daily   mometasone-formoterol  2 puff Inhalation BID   multivitamin with minerals  1 tablet Oral Daily   pantoprazole  40 mg Oral Q0600   thiamine  100 mg Oral Daily   Or   thiamine  100 mg Intravenous Daily   umeclidinium bromide  1 puff Inhalation Daily   Continuous Infusions:  ciprofloxacin     ciprofloxacin     PRN Meds: acetaminophen, albuterol, LORazepam **OR** LORazepam, ondansetron (ZOFRAN) IV  Allergies:    Allergies  Allergen Reactions   Penicillins Anaphylaxis    Did it involve swelling of the face/tongue/throat, SOB, or low BP? Yes Did it involve sudden or severe rash/hives, skin peeling, or any reaction on the inside of your mouth or nose?  No Did you need to seek medical attention at a hospital or doctor's office? No When did it last happen?childhood If all above answers are NO, may proceed with cephalosporin use.     Social History:   Social History   Socioeconomic History   Marital status: Married    Spouse name: Not on file   Number of children: Not on file   Years of education: Not on file   Highest education level: Not on file  Occupational History   Not on file  Social Needs   Financial resource strain: Not on file   Food insecurity    Worry: Not on file    Inability: Not on file   Transportation needs    Medical: Not on file    Non-medical: Not on file  Tobacco Use   Smoking status: Current Every Day Smoker    Packs/day: 1.00    Years: 42.00    Pack years: 42.00    Types: Cigarettes   Smokeless tobacco: Never Used  Substance and Sexual Activity   Alcohol use: Yes    Comment: 4-5 beers a day   Drug use: No   Sexual activity: Not on file  Lifestyle   Physical activity    Days per week: Not on file    Minutes per session: Not on file   Stress: Not on file  Relationships   Social connections    Talks on phone: Not on file    Gets together: Not on file    Attends religious service: Not on file    Active member of club or organization: Not on file    Attends meetings of clubs or organizations: Not on file    Relationship status: Not on file   Intimate partner violence    Fear of current or ex partner: Not on file    Emotionally abused: Not on file    Physically abused: Not on file    Forced sexual activity: Not on file  Other Topics Concern   Not on file  Social History Narrative   Not on file    Family History:   Family History  Problem Relation Age  of Onset   Dementia Mother      ROS:  Please see the history of present illness.  All other ROS reviewed and negative.     Physical Exam/Data:   Vitals:   04/05/19 0526 04/05/19 0559 04/05/19 0600  04/05/19 0616  BP: (!) 179/88 (!) 152/87  (!) 152/87  Pulse: 75 64    Resp: 16     Temp: 98.3 F (36.8 C) 98.5 F (36.9 C)    TempSrc: Rectal Oral    SpO2: 100% 94%    Weight:   63.5 kg   Height:   6\' 3"  (1.905 m)     Intake/Output Summary (Last 24 hours) at 04/05/2019 1037 Last data filed at 04/04/2019 2244 Gross per 24 hour  Intake 1000 ml  Output --  Net 1000 ml   Last 3 Weights 04/05/2019 12/02/2018 12/01/2018  Weight (lbs) 139 lb 15.9 oz 140 lb 10.5 oz 140 lb  Weight (kg) 63.5 kg 63.8 kg 63.504 kg     Body mass index is 17.5 kg/m.  General: Cachectic white male in no acute distress HEENT: normal Lymph: no adenopathy Neck: no JVD Endocrine:  No thryomegaly Vascular: No carotid bruits; FA pulses 2+ bilaterally without bruits  Cardiac:  normal S1, S2; RRR; no murmur; TTP anterior chest wall Lungs:  + rhonchi  Abd: soft, nontender, no hepatomegaly  Ext: no edema Musculoskeletal:  No deformities, BUE and BLE strength normal and equal Skin: warm and dry  Neuro:  CNs 2-12 intact, no focal abnormalities noted Psych:  Normal affect   EKG:  The EKG was personally reviewed and demonstrates:  NSR, 90 bpm, LVH, old TWI V4-V6, RAD; q waves anterior leads Telemetry:  Telemetry was personally reviewed and demonstrates:  NSR, HR 60-70 with some lower rates in the 50s  Relevant CV Studies:  Echo 12/02/2018  1. The left ventricle has a visually estimated ejection fraction of 55%. The cavity size was mildly dilated. Left ventricular diastolic Doppler parameters are consistent with pseudonormalization. Basal inferior hypokinesis.  2. Left atrial size was moderately dilated.  3. No evidence of mitral valve stenosis. Trivial mitral regurgitation.  4. The aortic valve is tricuspid. Mild calcification of the aortic valve. No stenosis of the aortic valve.  5. There is mild dilatation of the aortic root measuring 41 mm.  6. Normal IVC size. No complete TR doppler jet so unable to estimate  PA systolic pressure.  7. The right ventricle has normal systolic function. The cavity was normal. There is no increase in right ventricular wall thickness.   Laboratory Data:  High Sensitivity Troponin:   Recent Labs  Lab 04/03/19 0302 04/03/19 0650 04/04/19 1939 04/05/19 0236  TROPONINIHS 11 8 20* 29*     Chemistry Recent Labs  Lab 04/03/19 0302 04/04/19 1939 04/05/19 0525  NA 134* 137 137  K 3.0* 4.1 4.0  CL 100 99 102  CO2 19* 20* 18*  GLUCOSE 81 124* 72  BUN <5* 5* 7*  CREATININE 0.58* 0.83 0.87  CALCIUM 8.0* 9.2 8.2*  GFRNONAA >60 >60 >60  GFRAA >60 >60 >60  ANIONGAP 15 18* 17*    Recent Labs  Lab 04/03/19 0302 04/04/19 1939 04/05/19 0525  PROT 6.5 7.4 6.1*  ALBUMIN 3.5 4.1 3.0*  AST 54* 49* 34  ALT 28 31 23   ALKPHOS 78 104 81  BILITOT 0.8 2.2* 1.5*   Hematology Recent Labs  Lab 04/04/19 1939 04/05/19 0525  WBC 12.1* 9.3  RBC 4.52 3.78*  HGB 16.7 14.0  HCT 47.7 41.8  MCV 105.5* 110.6*  MCH 36.9* 37.0*  MCHC 35.0 33.5  RDW 13.2 13.3  PLT 204 208   BNPNo results for input(s): BNP, PROBNP in the last 168 hours.  DDimer No results for input(s): DDIMER in the last 168 hours.   Radiology/Studies:  Dg Chest 2 View  Result Date: 04/04/2019 CLINICAL DATA:  68 year old male with chest pain. EXAM: CHEST - 2 VIEW COMPARISON:  Chest radiograph dated 04/03/2019 FINDINGS: Shallow inspiration with minimal bibasilar atelectasis. No focal consolidation, pleural effusion, pneumothorax. Right pleural thickening and density at the right costophrenic angle, likely scarring related to prior rib fractures. Mild cardiomegaly. Median sternotomy wires and CABG vascular clips. Atherosclerotic calcification of the aorta. IMPRESSION: 1. No active cardiopulmonary disease.  No interval change. 2. Mild cardiomegaly. Electronically Signed   By: Anner Crete M.D.   On: 04/04/2019 19:22   Dg Thoracic Spine 2 View  Result Date: 04/05/2019 CLINICAL DATA:  Back pain.  EXAM: THORACIC SPINE 2 VIEWS COMPARISON:  Chest x-ray 04/03/2019. MRI T-spine 11/23/2018. T-spine series 11/23/2018. FINDINGS: Diffuse osteopenia and degenerative change. Stable mid and lower thoracic vertebral body compression fractures are noted. No acute abnormality. Postsurgical changes in the chest. Vascular stent noted over the left upper chest. IMPRESSION: Diffuse osteopenia and degenerative change. Stable mid and lower thoracic vertebral body compression fractures are noted. No acute abnormality identified. Electronically Signed   By: Marcello Moores  Register   On: 04/05/2019 05:53   Ct Head Wo Contrast  Result Date: 04/04/2019 CLINICAL DATA:  Altered level of consciousness with nausea and dizziness EXAM: CT HEAD WITHOUT CONTRAST TECHNIQUE: Contiguous axial images were obtained from the base of the skull through the vertex without intravenous contrast. COMPARISON:  11/23/2018 FINDINGS: Brain: Right basal ganglia lacunar infarct is again seen. Mild atrophic changes and chronic ischemic changes are noted. Stable remote right thalamic infarct is noted. No findings to suggest acute hemorrhage, acute infarction or space-occupying mass lesion are noted. Vascular: No hyperdense vessel or unexpected calcification. Skull: Normal. Negative for fracture or focal lesion. Sinuses/Orbits: No acute finding. Other: None IMPRESSION: Chronic lacunar infarcts as described. No acute abnormality is noted. Stable atrophy and white matter ischemic change. Electronically Signed   By: Inez Catalina M.D.   On: 04/04/2019 21:16   Ct Abdomen Pelvis W Contrast  Result Date: 04/04/2019 CLINICAL DATA:  Nausea and vomiting EXAM: CT ABDOMEN AND PELVIS WITH CONTRAST TECHNIQUE: Multidetector CT imaging of the abdomen and pelvis was performed using the standard protocol following bolus administration of intravenous contrast. CONTRAST:  188mL OMNIPAQUE IOHEXOL 300 MG/ML  SOLN COMPARISON:  CT 08/19/2018 FINDINGS: Lower chest: Lung bases  demonstrate scarring or atelectasis at the posterior lung bases. Emphysema. No acute consolidation or pleural effusion. Borderline heart size. Coronary vascular calcification Hepatobiliary: No focal liver abnormality is seen. Status post cholecystectomy. No biliary dilatation. Pancreas: Unremarkable. No pancreatic ductal dilatation or surrounding inflammatory changes. Spleen: Normal in size without focal abnormality. Adrenals/Urinary Tract: Adrenal glands are unremarkable. Kidneys are normal, without renal calculi, focal lesion, or hydronephrosis. Bladder is slightly thick walled. Stomach/Bowel: The stomach is nonenlarged. No dilated small bowel. Negative for colon wall thickening. Negative appendix. Vascular/Lymphatic: Heavily calcified aorta. No aneurysm. No significantly enlarged lymph nodes. Reproductive: No masses.  Status post prostatectomy. Other: Negative for free air or free fluid. Musculoskeletal: No acute or suspicious osseous abnormality. Intramedullary rodding left femur. Stable lucency at the tip of the femoral component without femoral head breech or collapse. IMPRESSION:  1. No CT evidence for acute intra-abdominal or pelvic abnormality. 2. Slightly thick-walled appearance of the urinary bladder, nonspecific, questionable for cystitis 3. Emphysema Electronically Signed   By: Donavan Foil M.D.   On: 04/04/2019 21:21   Dg Chest Portable 1 View  Result Date: 04/03/2019 CLINICAL DATA:  68 year old male with chest and back pain. EXAM: PORTABLE CHEST 1 VIEW COMPARISON:  Chest radiograph dated 12/01/2018 and CT dated 12/01/2018 FINDINGS: There is background of emphysema. There is minimal bibasilar atelectasis and right costophrenic density similar to prior radiograph, likely chronic changes and scarring. No lobar consolidation, pleural effusion, or pneumothorax. Thickened appearance of the right lateral pleural surface, likely chronic and scarring secondary to right rib fractures. No pneumothorax  identified. Lucent line in the right upper lobe most consistent with skin fold. Stable cardiac silhouette. Median sternotomy wires and CABG vascular clips and left brachiocephalic vascular stent. Multiple old healed right rib fractures. Slightly displaced appearance of several right lateral ribs may be chronic although acute fractures not excluded on this single view radiograph. Correlation with clinical exam recommended. If there is clinical concern for an acute fracture dedicated rib series recommended. IMPRESSION: 1. No acute cardiopulmonary process. 2. Slightly displaced appearance of several right lateral ribs may be chronic although acute fractures not excluded on this single view. Dedicated rib series may provide better evaluation. Electronically Signed   By: Anner Crete M.D.   On: 04/03/2019 01:27    Assessment and Plan:   Atypical chest pain/CAD s/p CABG Patient presented with sharp anterior chest pain with no associated symptoms. Pain is worse with coughing and taking a deep breath. HS troponin was 20 > 29. EKG showed NSR, 90 bpm, LVH, with no acute changes. Echo 12/02/18 showed EF 55%, pseudonormalization, moderately dilated LA, mild dilation of aortic root 41 mm.  - On interview patient is a poor historian and it is difficult to obtain details about his chest pain  - On exam he is TTP on the anterior chest wall - He reportedly has history of CABG but is unsure what year. He does not regularly see a doctor.  - Home meds include ASA 81 mg and Lipitor 40 mg - LDL in June 2020 was 122. A1C was 4.5 - Will order a Nuclear stress test to evaluate for ischemia - Recommend lifestyle changes.  Hypertension - Pressures elevated since admission. He is not on any medications at home - No BB for h/o cocaine use - Will start Lisinopril 10 mg daily  Possible UTI - Cipro per IM  Chronic alcohol abuse - drinks 2-3 beers every other day - CIWA per IM  COPD/Current smoker - H/o of COPD - No  active wheezing. Denies SOB  History of CVA - on Aspirin and Lipitor  For questions or updates, please contact [place] Please consult [email or web address] for contact info under     Signed, Tanzie Rothschild Ninfa Meeker, PA-C  04/05/2019 10:37 AM

## 2019-04-05 NOTE — ED Notes (Signed)
Phlebotomy at bedside.

## 2019-04-05 NOTE — H&P (Addendum)
History and Physical    [name] Pace [id number] DOB: 06-11-1950 DOA: 04/04/2019  PCP: Patient, No Pcp Per  Patient coming from: Home.  Chief Complaint: Chest pain.  HPI: [name] Pace is a 68 y.o. male with history of CAD status post CABG, polysubstance abuse, prostate cancer breast cancer per the report in the chart COPD presents to the ER with complaint of chest pain.  Patient is a poor historian.  Patient states he has been having chest pain for last few days mostly in the left anterior chest wall denies any radiation or any associated shortness of breath.  ED Course: In the ER patient was afebrile chest x-ray does not show anything acute x-ray of the T-spine shows chronic changes.  Patient is lethargic on exam.  But answers questions appropriately.  CT of the head was unremarkable.  EKG shows normal sinus rhythm.  High-sensitivity troponins were 20 and 29.  Lactic acid initially was 2.3 improved with 2 1.  UA shows features concerning for UTI.  WBC count was 12.1 COVID-19 was negative.  Given history of CAD patient admitted for chest pain and further work-up.  On exam patient does have some tenderness in the costochondral junction ending also pain increased on movement.  ER provider on exam felt that patient had some abdominal discomfort for which CT abdomen was done which shows urinary bladder thickening.  I did discuss with patient's daughter [name] Pace who states that patient takes drugs and drinks alcohol every day and lives with a lady who also does the same thing.  Review of Systems: As per HPI, rest all negative.   Past Medical History:  Diagnosis Date   Alcohol abuse    Anxiety    Breast cancer ([place]) Left breast   Per patient diagnosed in early 44's.    Cocaine abuse ([place]) 08/19/2018   Coronary artery disease    High cholesterol    Left rib fracture 08/19/2018   Prostate cancer ([place])    per patient diagnosed in early 50's   Stroke [id number])     Past Surgical  History:  Procedure Laterality Date   CAROTID STENT     CORONARY ARTERY BYPASS GRAFT     FEMUR IM NAIL Left 07/02/2014   Procedure: INTRAMEDULLARY (IM) NAIL FEMORAL;  Surgeon: Elie Goody, MD;  Location: [facility];  Service: Orthopedics;  Laterality: Left;   I&D EXTREMITY Right 02/10/2016   Procedure: IRRIGATION AND DEBRIDEMENT EXTREMITY;  Surgeon: Dorna Leitz, MD;  Location: [facility];  Service: Orthopedics;  Laterality: Right;   TENDON REPAIR Right 02/10/2016   Procedure: Anterior TENDON REPAIR and wound exploration.;  Surgeon: Dorna Leitz, MD;  Location: [facility];  Service: Orthopedics;  Laterality: Right;     reports that he has been smoking cigarettes. He has a 42.00 pack-year smoking history. He has never used smokeless tobacco. He reports current alcohol use. He reports that he does not use drugs.  Allergies  Allergen Reactions   Penicillins Anaphylaxis    Did it involve swelling of the face/tongue/throat, SOB, or low BP? Yes Did it involve sudden or severe rash/hives, skin peeling, or any reaction on the inside of your mouth or nose? No Did you need to seek medical attention at a hospital or doctor's office? No When did it last happen?childhood If all above answers are NO, may proceed with cephalosporin use.     Family History  Problem Relation Age of Onset   Dementia Mother     Prior  to Admission medications   Medication Sig Start Date End Date Taking? Authorizing Provider  acetaminophen (TYLENOL) 500 MG tablet Take 500-1,000 mg by mouth every 6 (six) hours as needed for moderate pain.    [provider]  albuterol (VENTOLIN HFA) 108 (90 Base) MCG/ACT inhaler Inhale 2 puffs into the lungs every 6 (six) hours as needed for wheezing or shortness of breath. Use 2 puffs 3 times daily x5 days, then every 6 hours as needed. 12/02/18   Eugenie Filler, MD  aspirin EC 81 MG tablet Take 81 mg by mouth daily.    [provider]  atorvastatin (LIPITOR) 40  MG tablet Take 1 tablet (40 mg total) by mouth daily at 6 PM. 12/02/18   Eugenie Filler, MD  cholecalciferol (VITAMIN D3) 25 MCG (1000 UT) tablet Take 1,000 Units by mouth daily.    [provider]  fluticasone (FLONASE) 50 MCG/ACT nasal spray Place 2 sprays into both nostrils daily. 12/03/18   Eugenie Filler, MD  folic acid (FOLVITE) 1 MG tablet Take 1 tablet (1 mg total) by mouth daily. Patient not taking: Reported on 08/20/2018 06/09/18   Debbe Odea, MD  hydrOXYzine (ATARAX/VISTARIL) 25 MG tablet Take 1 tablet (25 mg total) by mouth every 6 (six) hours as needed for anxiety (or CIWA score </= 10). 12/02/18   Eugenie Filler, MD  lidocaine (LIDODERM) 5 % Place 2 patches onto the skin daily. Remove & Discard patch within 12 hours or as directed by MD Patient not taking: Reported on 08/20/2018 06/09/18   Debbe Odea, MD  loratadine (CLARITIN) 10 MG tablet Take 1 tablet (10 mg total) by mouth daily. 12/03/18   Eugenie Filler, MD  mometasone-formoterol (DULERA) 100-5 MCG/ACT AERO Inhale 2 puffs into the lungs 2 (two) times daily. 12/02/18   Eugenie Filler, MD  Multiple Vitamin (MULTIVITAMIN WITH MINERALS) TABS tablet Take 1 tablet by mouth daily. 12/03/18   Eugenie Filler, MD  nicotine (NICODERM CQ - DOSED IN MG/24 HOURS) 14 mg/24hr patch Place 1 patch (14 mg total) onto the skin daily. 12/03/18   Eugenie Filler, MD  OLANZapine (ZYPREXA) 2.5 MG tablet Take 2 tablets (5 mg total) by mouth at bedtime. Patient not taking: Reported on 08/20/2018 06/09/18   Debbe Odea, MD  OLANZapine (ZYPREXA) 5 MG tablet Take 0.5 tablets (2.5 mg total) by mouth daily as needed (agitation). Patient not taking: Reported on 08/20/2018 06/09/18   Debbe Odea, MD  pantoprazole (PROTONIX) 40 MG tablet Take 1 tablet (40 mg total) by mouth daily at 6 (six) AM. 12/03/18   Eugenie Filler, MD  polyethylene glycol [facility] / Floria Raveling) packet Take 17 g by mouth daily as needed for moderate constipation or  severe constipation. Patient not taking: Reported on 08/20/2018 06/09/18   Debbe Odea, MD  potassium chloride (KLOR-CON) 10 MEQ tablet Take 1 tablet (10 mEq total) by mouth daily. 04/03/19   Tacy Learn, PA-C  predniSONE (DELTASONE) 20 MG tablet Take 1-2 tablets (20-40 mg total) by mouth daily with breakfast. Take 2 tablets (40mg ) daily x3 days, then 1 tablet (20mg ) daily x3 days then stop 12/03/18   Eugenie Filler, MD  thiamine 100 MG tablet Take 1 tablet (100 mg total) by mouth daily. 12/03/18   Eugenie Filler, MD  tiotropium (SPIRIVA HANDIHALER) 18 MCG inhalation capsule Place 1 capsule (18 mcg total) into inhaler and inhale daily for 30 days. 12/02/18 01/01/19  Eugenie Filler, MD  traMADol (  ULTRAM) 50 MG tablet Take 1 tablet (50 mg total) by mouth every 6 (six) hours as needed. Patient not taking: Reported on 12/01/2018 11/23/18   Jola Schmidt, MD  vitamin B-12 1000 MCG tablet Take 1 tablet (1,000 mcg total) by mouth daily. Patient not taking: Reported on 08/20/2018 06/09/18   Debbe Odea, MD  vitamin C (ASCORBIC ACID) 500 MG tablet Take 500 mg by mouth daily.    [provider]    Physical Exam: Constitutional: Moderately built and nourished. Vitals:   04/04/19 2140 04/04/19 2200 04/05/19 0018 04/05/19 0030  BP: (!) 165/97 (!) 162/95 134/86 134/86  Pulse: 90  82 82  Resp:   16   Temp:   97.9 F (36.6 C)   TempSrc:   Oral   SpO2: 92%  94%    Eyes: Anicteric no pallor. ENMT: No discharge from the ears eyes nose or mouth. Neck: No mass felt.  No neck rigidity. Respiratory: No rhonchi or crepitations. Cardiovascular: S1-S2 heard. Abdomen: Soft nontender bowel sounds present. Musculoskeletal: No edema. Skin: Chronic skin changes. Neurologic: Patient is very lethargic but easily arousable and answers questions appropriately and moves all extremities. Psychiatric: No suicidal ideation.   Labs on Admission: I have personally reviewed following labs and imaging  studies  CBC: Recent Labs  Lab 04/04/19 1939  WBC 12.1*  HGB 16.7  HCT 47.7  MCV 105.5*  PLT 0000000   Basic Metabolic Panel: Recent Labs  Lab 04/03/19 0302 04/04/19 1939  NA 134* 137  K 3.0* 4.1  CL 100 99  CO2 19* 20*  GLUCOSE 81 124*  BUN <5* 5*  CREATININE 0.58* 0.83  CALCIUM 8.0* 9.2   GFR: CrCl cannot be calculated (Unknown ideal weight.). Liver Function Tests: Recent Labs  Lab 04/03/19 0302 04/04/19 1939  AST 54* 49*  ALT 28 31  ALKPHOS 78 104  BILITOT 0.8 2.2*  PROT 6.5 7.4  ALBUMIN 3.5 4.1   Recent Labs  Lab 04/04/19 1939  LIPASE 24   Recent Labs  Lab 04/04/19 1939  AMMONIA 24   Coagulation Profile: Recent Labs  Lab 04/04/19 1939 04/05/19 0236  INR 0.9 1.0   Cardiac Enzymes: No results for input(s): CKTOTAL, CKMB, CKMBINDEX, TROPONINI in the last 168 hours. BNP (last 3 results) No results for input(s): PROBNP in the last 8760 hours. HbA1C: No results for input(s): HGBA1C in the last 72 hours. CBG: No results for input(s): GLUCAP in the last 168 hours. Lipid Profile: No results for input(s): CHOL, HDL, LDLCALC, TRIG, CHOLHDL, LDLDIRECT in the last 72 hours. Thyroid Function Tests: No results for input(s): TSH, T4TOTAL, FREET4, T3FREE, THYROIDAB in the last 72 hours. Anemia Panel: No results for input(s): VITAMINB12, FOLATE, FERRITIN, TIBC, IRON, RETICCTPCT in the last 72 hours. Urine analysis:    Component Value Date/Time   COLORURINE YELLOW 04/05/2019 0236   APPEARANCEUR CLEAR 04/05/2019 0236   LABSPEC 1.014 04/05/2019 0236   PHURINE 6.0 04/05/2019 0236   GLUCOSEU NEGATIVE 04/05/2019 0236   HGBUR NEGATIVE 04/05/2019 0236   BILIRUBINUR NEGATIVE 04/05/2019 0236   KETONESUR 5 (A) 04/05/2019 0236   PROTEINUR NEGATIVE 04/05/2019 0236   UROBILINOGEN 0.2 06/14/2014 2135   NITRITE POSITIVE (A) 04/05/2019 0236   LEUKOCYTESUR NEGATIVE 04/05/2019 0236   Sepsis Labs: @LABRCNTIP (procalcitonin:4,lacticidven:4) ) Recent Results (from the  past 240 hour(s))  SARS CORONAVIRUS 2 (TAT 6-24 HRS) Nasopharyngeal Nasopharyngeal Swab     Status: None   Collection Time: 04/03/19  6:35 AM   Specimen: Nasopharyngeal Swab  Result Value Ref Range Status   SARS Coronavirus 2 NEGATIVE NEGATIVE Final    Comment: (NOTE) SARS-CoV-2 target nucleic acids are NOT DETECTED. The SARS-CoV-2 RNA is generally detectable in upper and lower respiratory specimens during the acute phase of infection. Negative results do not preclude SARS-CoV-2 infection, do not rule out co-infections with other pathogens, and should not be used as the sole basis for treatment or other patient management decisions. Negative results must be combined with clinical observations, patient history, and epidemiological information. The expected result is Negative. Fact Sheet for Patients: SugarRoll.be Fact Sheet for Healthcare Providers: https://www.woods-mathews.com/ This test is not yet approved or cleared by the [place] FDA and  has been authorized for detection and/or diagnosis of SARS-CoV-2 by FDA under an Emergency Use Authorization (EUA). This EUA will remain  in effect (meaning this test can be used) for the duration of the COVID-19 declaration under Section 56 4(b)(1) of the Act, 21 U.S.C. section 360bbb-3(b)(1), unless the authorization is terminated or revoked sooner. Performed at [facility], [place] [address] [place], [place] [address]   SARS CORONAVIRUS 2 (TAT 6-24 HRS) Nasopharyngeal Nasopharyngeal Swab     Status: None   Collection Time: 04/04/19 10:27 PM   Specimen: Nasopharyngeal Swab  Result Value Ref Range Status   SARS Coronavirus 2 NEGATIVE NEGATIVE Final    Comment: (NOTE) SARS-CoV-2 target nucleic acids are NOT DETECTED. The SARS-CoV-2 RNA is generally detectable in upper and lower respiratory specimens during the acute phase of infection. Negative results do not preclude SARS-CoV-2  infection, do not rule out co-infections with other pathogens, and should not be used as the sole basis for treatment or other patient management decisions. Negative results must be combined with clinical observations, patient history, and epidemiological information. The expected result is Negative. Fact Sheet for Patients: SugarRoll.be Fact Sheet for Healthcare Providers: https://www.woods-mathews.com/ This test is not yet approved or cleared by the [place] FDA and  has been authorized for detection and/or diagnosis of SARS-CoV-2 by FDA under an Emergency Use Authorization (EUA). This EUA will remain  in effect (meaning this test can be used) for the duration of the COVID-19 declaration under Section 56 4(b)(1) of the Act, 21 U.S.C. section 360bbb-3(b)(1), unless the authorization is terminated or revoked sooner. Performed at [facility], [place] [address], [place], [place] [address]      Radiological Exams on Admission: Dg Chest 2 View  Result Date: 04/04/2019 CLINICAL DATA:  68 year old male with chest pain. EXAM: CHEST - 2 VIEW COMPARISON:  Chest radiograph dated 04/03/2019 FINDINGS: Shallow inspiration with minimal bibasilar atelectasis. No focal consolidation, pleural effusion, pneumothorax. Right pleural thickening and density at the right costophrenic angle, likely scarring related to prior rib fractures. Mild cardiomegaly. Median sternotomy wires and CABG vascular clips. Atherosclerotic calcification of the aorta. IMPRESSION: 1. No active cardiopulmonary disease.  No interval change. 2. Mild cardiomegaly. Electronically Signed   By: Anner Crete M.D.   On: 04/04/2019 19:22   Ct Head Wo Contrast  Result Date: 04/04/2019 CLINICAL DATA:  Altered level of consciousness with nausea and dizziness EXAM: CT HEAD WITHOUT CONTRAST TECHNIQUE: Contiguous axial images were obtained from the base of the skull through the vertex without  intravenous contrast. COMPARISON:  11/23/2018 FINDINGS: Brain: Right basal ganglia lacunar infarct is again seen. Mild atrophic changes and chronic ischemic changes are noted. Stable remote right thalamic infarct is noted. No findings to suggest acute hemorrhage, acute infarction or space-occupying mass lesion are noted.  Vascular: No hyperdense vessel or unexpected calcification. Skull: Normal. Negative for fracture or focal lesion. Sinuses/Orbits: No acute finding. Other: None IMPRESSION: Chronic lacunar infarcts as described. No acute abnormality is noted. Stable atrophy and white matter ischemic change. Electronically Signed   By: Inez Catalina M.D.   On: 04/04/2019 21:16   Ct Abdomen Pelvis W Contrast  Result Date: 04/04/2019 CLINICAL DATA:  Nausea and vomiting EXAM: CT ABDOMEN AND PELVIS WITH CONTRAST TECHNIQUE: Multidetector CT imaging of the abdomen and pelvis was performed using the standard protocol following bolus administration of intravenous contrast. CONTRAST:  146mL OMNIPAQUE IOHEXOL 300 MG/ML  SOLN COMPARISON:  CT 08/19/2018 FINDINGS: Lower chest: Lung bases demonstrate scarring or atelectasis at the posterior lung bases. Emphysema. No acute consolidation or pleural effusion. Borderline heart size. Coronary vascular calcification Hepatobiliary: No focal liver abnormality is seen. Status post cholecystectomy. No biliary dilatation. Pancreas: Unremarkable. No pancreatic ductal dilatation or surrounding inflammatory changes. Spleen: Normal in size without focal abnormality. Adrenals/Urinary Tract: Adrenal glands are unremarkable. Kidneys are normal, without renal calculi, focal lesion, or hydronephrosis. Bladder is slightly thick walled. Stomach/Bowel: The stomach is nonenlarged. No dilated small bowel. Negative for colon wall thickening. Negative appendix. Vascular/Lymphatic: Heavily calcified aorta. No aneurysm. No significantly enlarged lymph nodes. Reproductive: No masses.  Status post  prostatectomy. Other: Negative for free air or free fluid. Musculoskeletal: No acute or suspicious osseous abnormality. Intramedullary rodding left femur. Stable lucency at the tip of the femoral component without femoral head breech or collapse. IMPRESSION: 1. No CT evidence for acute intra-abdominal or pelvic abnormality. 2. Slightly thick-walled appearance of the urinary bladder, nonspecific, questionable for cystitis 3. Emphysema Electronically Signed   By: Donavan Foil M.D.   On: 04/04/2019 21:21    EKG: Independently reviewed.  Normal sinus rhythm with LVH.  Assessment/Plan Principal Problem:   Chest pain Active Problems:   Alcohol abuse   CAD (coronary artery disease)    1. Chest pain with history of CAD status post CABG will admit for observation.  Aspirin.  Patient states he has not been taking any of his medications.  Mildly in the bradycardic side at this time so not adding any beta-blockers.  Cardiology notified. 2. History of alcohol abuse patient states he drinks every day but not able to exactly say what he drinks.  We will keep patient on CIWA. 3. History of COPD per the chart not actively wheezing. 4. Possible UTI for which patient is placed on Cipro.  Follow urine cultures. 5. Lethargy -patient appears mildly lethargic ammonia levels were normal CT head unremarkable.  Per patient's daughter patient takes drugs and alcohol every day.  Closely monitor.  Will need to get further history when patient is more alert awake.   DVT prophylaxis: Lovenox. Code Status: Full code. Family Communication: Patient's daughter. Disposition Plan: To be determined. Consults called: Cardiology. Admission status: Observation.   Rise Patience MD [facility] Pager [phone].  If 7PM-7AM, please contact night-coverage [email or web address] Password [email or web address]  04/05/2019, 5:03 AM

## 2019-04-05 NOTE — ED Notes (Signed)
Second Phlebotomist to attempt to collect blood

## 2019-04-05 NOTE — ED Notes (Signed)
Patient transported to X-ray 

## 2019-04-06 DIAGNOSIS — I2510 Atherosclerotic heart disease of native coronary artery without angina pectoris: Secondary | ICD-10-CM | POA: Diagnosis not present

## 2019-04-06 DIAGNOSIS — F1010 Alcohol abuse, uncomplicated: Secondary | ICD-10-CM | POA: Diagnosis not present

## 2019-04-06 DIAGNOSIS — I2583 Coronary atherosclerosis due to lipid rich plaque: Secondary | ICD-10-CM | POA: Diagnosis not present

## 2019-04-06 DIAGNOSIS — I200 Unstable angina: Secondary | ICD-10-CM | POA: Diagnosis not present

## 2019-04-06 LAB — LIPID PANEL
Cholesterol: 218 mg/dL — ABNORMAL HIGH (ref 0–200)
HDL: 109 mg/dL (ref >40)
LDL Cholesterol: 89 mg/dL (ref 0–99)
Total CHOL/HDL Ratio: 2 RATIO
Triglycerides: 100 mg/dL (ref <150)
VLDL: 20 mg/dL (ref 0–40)

## 2019-04-06 MED ORDER — KCL IN DEXTROSE-NACL 20-5-0.9 MEQ/L-%-% IV SOLN
INTRAVENOUS | Status: DC
  Administered 2019-04-06 – 2019-04-07 (×3): via INTRAVENOUS
  Filled 2019-04-06 (×3): qty 1000

## 2019-04-06 MED ORDER — SPIRITUS FRUMENTI
1.0000 | ORAL | Status: DC | PRN
  Administered 2019-04-07: 1 via ORAL
  Filled 2019-04-06 (×6): qty 1

## 2019-04-06 MED ORDER — POTASSIUM CHLORIDE 2 MEQ/ML IV SOLN
INTRAVENOUS | Status: DC
  Filled 2019-04-06 (×3): qty 1000

## 2019-04-06 MED ORDER — CHLORDIAZEPOXIDE HCL 25 MG PO CAPS
25.0000 mg | ORAL_CAPSULE | Freq: Three times a day (TID) | ORAL | Status: DC
  Administered 2019-04-06 – 2019-04-10 (×12): 25 mg via ORAL
  Filled 2019-04-06 (×12): qty 1

## 2019-04-06 NOTE — Progress Notes (Signed)
RN paged Cardiology about pt's being combative on restraint last night. Will wait for Card to decide whether to take pt to stress test or not.

## 2019-04-06 NOTE — Progress Notes (Signed)
On 10/30 @ 2000, patient started to become very agitated. Patient became violent and combative towards staff, Ativan given multiple times. Patient had to be restrained. Will continue to monitor. MD notified.  Elaina Hoops, RN

## 2019-04-06 NOTE — Progress Notes (Signed)
PROGRESS NOTE    [name] Pace  [id number] DOB: 12-Nov-1950 DOA: 04/04/2019 PCP: Patient, No Pcp Per   Brief Narrative:  [name] Pace is a 68 y.o. male with history of CAD status post CABG, polysubstance abuse, prostate cancer presented to ED with complaint of chest pain for last few days without any shortness of breath or any radiation.  Upon arrival to ER, patient was afebrile chest x-ray does not show anything acute x-ray of the T-spine shows chronic changes. CT of the head was unremarkable.  EKG shows normal sinus rhythm.  High-sensitivity troponins were 20 and 29.  Lactic acid initially was 2.3 improved with 2 1.  UA shows features concerning for UTI.  WBC count was 12.1 COVID-19 was negative.  Given history of CAD patient admitted for chest pain and further work-up. ER provider on exam felt that patient had some abdominal discomfort for which CT abdomen was done which shows urinary bladder thickening.  Cardiology was consulted.  Assessment & Plan:   Principal Problem:   Chest pain Active Problems:   Alcohol abuse   CAD (coronary artery disease)   Elevated troponin   Severe alcohol use disorder (HCC)  Chest pain: Chest pain has resolved.  No ST-T wave changes on EKG.  Troponin slightly elevated but flat.  Cardiology on board.  Initial plan was to do stress test today but patient has not slept well all night long and he has been writhing in the bed due to active alcohol withdrawal.  I did notice some ST depression on the monitor today.  Ordered stat EKG.  Will defer to cardiology on that.  Alcohol withdrawal/chronic alcohol dependence: Patient CIWA has been above 10 since yesterday.  He has received several doses of Ativan.  Continue CIWA protocol with as needed Ativan and will also start him on Librium 25 mg 3 times daily.  UTI: Nitrites positive on UA.  Patient poor historian.  Unable to tell us if he had any symptoms.  Due to him being male, I will caution towards treating.   Continue ciprofloxacin.  Urine culture still pending.  DVT prophylaxis: Lovenox Code Status: Full code Family Communication: None present at bedside. Disposition Plan: To be determined  Estimated body mass index is 17.5 kg/m as calculated from the following:   Height as of this encounter: 6\' 3"  (1.905 m).   Weight as of this encounter: 63.5 kg.      Nutritional status:               Consultants:   Cardiology  Procedures:   None  Antimicrobials:   Ciprofloxacin   Subjective: Patient seen and examined this morning.  He was laying on the bed with legs hanging and he was laying in a horizontal position.  He was completely confused.  Per sitter, he has been confused since last night.  Objective: Vitals:   04/06/19 0823 04/06/19 0825 04/06/19 0924 04/06/19 1155  BP: (!) 187/117 (!) 141/79  (!) 141/101  Pulse:  (!) 101 (!) 114   Resp:    (!) 25  Temp:      TempSrc:      SpO2:      Weight:      Height:       No intake or output data in the 24 hours ending 04/06/19 1159 Filed Weights   04/05/19 0600  Weight: 63.5 kg    Examination:  General exam: Appears completely confused and slightly lethargic Respiratory system: Clear to auscultation. Respiratory  effort normal. Cardiovascular system: S1 & S2 heard, RRR. No JVD, murmurs, rubs, gallops or clicks. No pedal edema. Gastrointestinal system: Abdomen is nondistended, soft and nontender. No organomegaly or masses felt. Normal bowel sounds heard. Central nervous system: Confused and lethargic Extremities: Symmetric 5 x 5 power. Skin: No rashes, lesions or ulcers Psychiatry: Unable to assess due to being confused   Data Reviewed: I have personally reviewed following labs and imaging studies  CBC: Recent Labs  Lab 04/04/19 1939 04/05/19 0525  WBC 12.1* 9.3  HGB 16.7 14.0  HCT 47.7 41.8  MCV 105.5* 110.6*  PLT 204 [id number]   Basic Metabolic Panel: Recent Labs  Lab 04/03/19 0302 04/04/19 1939  04/05/19 0525  NA 134* 137 137  K 3.0* 4.1 4.0  CL 100 99 102  CO2 19* 20* 18*  GLUCOSE 81 124* 72  BUN <5* 5* 7*  CREATININE 0.58* 0.83 0.87  CALCIUM 8.0* 9.2 8.2*  MG  --   --  1.7  PHOS  --   --  4.0   GFR: Estimated Creatinine Clearance: 75 mL/min (by C-G formula based on SCr of 0.87 mg/dL). Liver Function Tests: Recent Labs  Lab 04/03/19 0302 04/04/19 1939 04/05/19 0525  AST 54* 49* 34  ALT 28 31 23   ALKPHOS 78 104 81  BILITOT 0.8 2.2* 1.5*  PROT 6.5 7.4 6.1*  ALBUMIN 3.5 4.1 3.0*   Recent Labs  Lab 04/04/19 1939  LIPASE 24   Recent Labs  Lab 04/04/19 1939  AMMONIA 24   Coagulation Profile: Recent Labs  Lab 04/04/19 1939 04/05/19 0236  INR 0.9 1.0   Cardiac Enzymes: No results for input(s): CKTOTAL, CKMB, CKMBINDEX, TROPONINI in the last 168 hours. BNP (last 3 results) No results for input(s): PROBNP in the last 8760 hours. HbA1C: No results for input(s): HGBA1C in the last 72 hours. CBG: No results for input(s): GLUCAP in the last 168 hours. Lipid Profile: Recent Labs    04/06/19 0831  CHOL 218*  HDL 109  LDLCALC 89  TRIG 100  CHOLHDL 2.0   Thyroid Function Tests: No results for input(s): TSH, T4TOTAL, FREET4, T3FREE, THYROIDAB in the last 72 hours. Anemia Panel: No results for input(s): VITAMINB12, FOLATE, FERRITIN, TIBC, IRON, RETICCTPCT in the last 72 hours. Sepsis Labs: Recent Labs  Lab 04/04/19 1930 04/05/19 0236  LATICACIDVEN 2.3* 1.0    Recent Results (from the past 240 hour(s))  SARS CORONAVIRUS 2 (TAT 6-24 HRS) Nasopharyngeal Nasopharyngeal Swab     Status: None   Collection Time: 04/03/19  6:35 AM   Specimen: Nasopharyngeal Swab  Result Value Ref Range Status   SARS Coronavirus 2 NEGATIVE NEGATIVE Final    Comment: (NOTE) SARS-CoV-2 target nucleic acids are NOT DETECTED. The SARS-CoV-2 RNA is generally detectable in upper and lower respiratory specimens during the acute phase of infection. Negative results do not  preclude SARS-CoV-2 infection, do not rule out co-infections with other pathogens, and should not be used as the sole basis for treatment or other patient management decisions. Negative results must be combined with clinical observations, patient history, and epidemiological information. The expected result is Negative. Fact Sheet for Patients: SugarRoll.be Fact Sheet for Healthcare Providers: https://www.woods-mathews.com/ This test is not yet approved or cleared by the [place] FDA and  has been authorized for detection and/or diagnosis of SARS-CoV-2 by FDA under an Emergency Use Authorization (EUA). This EUA will remain  in effect (meaning this test can be used) for the duration of the COVID-19 declaration  under Section 56 4(b)(1) of the Act, 21 U.S.C. section 360bbb-3(b)(1), unless the authorization is terminated or revoked sooner. Performed at [facility], [place] [address], [place], [place] [address]   Blood Culture (routine x 2)     Status: None (Preliminary result)   Collection Time: 04/04/19  7:00 PM   Specimen: BLOOD  Result Value Ref Range Status   Specimen Description BLOOD BLOOD LEFT HAND  Final   Special Requests AEROBIC BOTTLE ONLY Blood Culture adequate volume  Final   Culture   Final    NO GROWTH < 24 HOURS Performed at [facility], [place] [address] [place], [place] [address]    Report Status PENDING  Incomplete  Blood Culture (routine x 2)     Status: None (Preliminary result)   Collection Time: 04/04/19  8:30 PM   Specimen: BLOOD LEFT ARM  Result Value Ref Range Status   Specimen Description BLOOD LEFT ARM  Final   Special Requests   Final    BOTTLES DRAWN AEROBIC AND ANAEROBIC Blood Culture results may not be optimal due to an inadequate volume of blood received in culture bottles   Culture   Final    NO GROWTH 2 DAYS Performed at [facility], [place] [address], [place], [place] [address]     Report Status PENDING  Incomplete  SARS CORONAVIRUS 2 (TAT 6-24 HRS) Nasopharyngeal Nasopharyngeal Swab     Status: None   Collection Time: 04/04/19 10:27 PM   Specimen: Nasopharyngeal Swab  Result Value Ref Range Status   SARS Coronavirus 2 NEGATIVE NEGATIVE Final    Comment: (NOTE) SARS-CoV-2 target nucleic acids are NOT DETECTED. The SARS-CoV-2 RNA is generally detectable in upper and lower respiratory specimens during the acute phase of infection. Negative results do not preclude SARS-CoV-2 infection, do not rule out co-infections with other pathogens, and should not be used as the sole basis for treatment or other patient management decisions. Negative results must be combined with clinical observations, patient history, and epidemiological information. The expected result is Negative. Fact Sheet for Patients: SugarRoll.be Fact Sheet for Healthcare Providers: https://www.woods-mathews.com/ This test is not yet approved or cleared by the [place] FDA and  has been authorized for detection and/or diagnosis of SARS-CoV-2 by FDA under an Emergency Use Authorization (EUA). This EUA will remain  in effect (meaning this test can be used) for the duration of the COVID-19 declaration under Section 56 4(b)(1) of the Act, 21 U.S.C. section 360bbb-3(b)(1), unless the authorization is terminated or revoked sooner. Performed at [facility], [place] [address], [place], [place] [address]       Radiology Studies: Dg Chest 2 View  Result Date: 04/04/2019 CLINICAL DATA:  68 year old male with chest pain. EXAM: CHEST - 2 VIEW COMPARISON:  Chest radiograph dated 04/03/2019 FINDINGS: Shallow inspiration with minimal bibasilar atelectasis. No focal consolidation, pleural effusion, pneumothorax. Right pleural thickening and density at the right costophrenic angle, likely scarring related to prior rib fractures. Mild cardiomegaly. Median sternotomy  wires and CABG vascular clips. Atherosclerotic calcification of the aorta. IMPRESSION: 1. No active cardiopulmonary disease.  No interval change. 2. Mild cardiomegaly. Electronically Signed   By: Anner Crete M.D.   On: 04/04/2019 19:22   Dg Thoracic Spine 2 View  Result Date: 04/05/2019 CLINICAL DATA:  Back pain. EXAM: THORACIC SPINE 2 VIEWS COMPARISON:  Chest x-ray 04/03/2019. MRI T-spine 11/23/2018. T-spine series 11/23/2018. FINDINGS: Diffuse osteopenia and degenerative change. Stable mid and lower thoracic  vertebral body compression fractures are noted. No acute abnormality. Postsurgical changes in the chest. Vascular stent noted over the left upper chest. IMPRESSION: Diffuse osteopenia and degenerative change. Stable mid and lower thoracic vertebral body compression fractures are noted. No acute abnormality identified. Electronically Signed   By: Marcello Moores  Register   On: 04/05/2019 05:53   Ct Head Wo Contrast  Result Date: 04/04/2019 CLINICAL DATA:  Altered level of consciousness with nausea and dizziness EXAM: CT HEAD WITHOUT CONTRAST TECHNIQUE: Contiguous axial images were obtained from the base of the skull through the vertex without intravenous contrast. COMPARISON:  11/23/2018 FINDINGS: Brain: Right basal ganglia lacunar infarct is again seen. Mild atrophic changes and chronic ischemic changes are noted. Stable remote right thalamic infarct is noted. No findings to suggest acute hemorrhage, acute infarction or space-occupying mass lesion are noted. Vascular: No hyperdense vessel or unexpected calcification. Skull: Normal. Negative for fracture or focal lesion. Sinuses/Orbits: No acute finding. Other: None IMPRESSION: Chronic lacunar infarcts as described. No acute abnormality is noted. Stable atrophy and white matter ischemic change. Electronically Signed   By: Inez Catalina M.D.   On: 04/04/2019 21:16   Ct Abdomen Pelvis W Contrast  Result Date: 04/04/2019 CLINICAL DATA:  Nausea and  vomiting EXAM: CT ABDOMEN AND PELVIS WITH CONTRAST TECHNIQUE: Multidetector CT imaging of the abdomen and pelvis was performed using the standard protocol following bolus administration of intravenous contrast. CONTRAST:  151mL OMNIPAQUE IOHEXOL 300 MG/ML  SOLN COMPARISON:  CT 08/19/2018 FINDINGS: Lower chest: Lung bases demonstrate scarring or atelectasis at the posterior lung bases. Emphysema. No acute consolidation or pleural effusion. Borderline heart size. Coronary vascular calcification Hepatobiliary: No focal liver abnormality is seen. Status post cholecystectomy. No biliary dilatation. Pancreas: Unremarkable. No pancreatic ductal dilatation or surrounding inflammatory changes. Spleen: Normal in size without focal abnormality. Adrenals/Urinary Tract: Adrenal glands are unremarkable. Kidneys are normal, without renal calculi, focal lesion, or hydronephrosis. Bladder is slightly thick walled. Stomach/Bowel: The stomach is nonenlarged. No dilated small bowel. Negative for colon wall thickening. Negative appendix. Vascular/Lymphatic: Heavily calcified aorta. No aneurysm. No significantly enlarged lymph nodes. Reproductive: No masses.  Status post prostatectomy. Other: Negative for free air or free fluid. Musculoskeletal: No acute or suspicious osseous abnormality. Intramedullary rodding left femur. Stable lucency at the tip of the femoral component without femoral head breech or collapse. IMPRESSION: 1. No CT evidence for acute intra-abdominal or pelvic abnormality. 2. Slightly thick-walled appearance of the urinary bladder, nonspecific, questionable for cystitis 3. Emphysema Electronically Signed   By: Donavan Foil M.D.   On: 04/04/2019 21:21    Scheduled Meds:  aspirin  324 mg Oral Once   aspirin EC  81 mg Oral Daily   atorvastatin  40 mg Oral q1800   chlordiazePOXIDE  25 mg Oral TID   folic acid  1 mg Oral Daily   lisinopril  10 mg Oral Daily   mometasone-formoterol  2 puff Inhalation BID    multivitamin with minerals  1 tablet Oral Daily   pantoprazole  40 mg Oral Q0600   thiamine  100 mg Oral Daily   Or   thiamine  100 mg Intravenous Daily   umeclidinium bromide  1 puff Inhalation Daily   Continuous Infusions:  ciprofloxacin     ciprofloxacin 400 mg (04/06/19 0651)     LOS: 1 day   Time spent: 30 minutes   Darliss Cheney, MD [facility]  04/06/2019, 11:59 AM   To contact the attending provider between 7A-7P or the covering  provider during after hours 7P-7A, please log into the web site [email or web address] and use password [email or web address].

## 2019-04-06 NOTE — Progress Notes (Signed)
Pt has poor PO intake due to alcohol withdrawal/ delirium. MD made aware.

## 2019-04-06 NOTE — Progress Notes (Signed)
Progress Note  Patient Name: [name] Pace Date of Encounter: 04/06/2019  Primary Cardiologist: Radford Pax   Subjective   68 year old gentleman with a history of coronary artery disease-status post CABG, chronic alcohol abuse, COPD, ongoing cigarette smoking.  He has had multiple ER visits for COPD exacerbations.  He also has had frequent falls. Been scheduled for a nuclear stress test today.  He is disoriented this morning.  It took 3 nurses to do an EKG on him.  He now is in restraints.  He has been ordered to have a stress test but this will have to be canceled until he is more cooperative.  Will be no way to get an accurate scan with him rolling around and thrashing around in bed. Inpatient Medications    Scheduled Meds:  aspirin  324 mg Oral Once   aspirin EC  81 mg Oral Daily   atorvastatin  40 mg Oral [id number]   folic acid  1 mg Oral Daily   lisinopril  10 mg Oral Daily   mometasone-formoterol  2 puff Inhalation BID   multivitamin with minerals  1 tablet Oral Daily   pantoprazole  40 mg Oral Q0600   thiamine  100 mg Oral Daily   Or   thiamine  100 mg Intravenous Daily   umeclidinium bromide  1 puff Inhalation Daily   Continuous Infusions:  ciprofloxacin     ciprofloxacin 400 mg (04/06/19 0651)   PRN Meds: acetaminophen, albuterol, LORazepam **OR** LORazepam, ondansetron (ZOFRAN) IV   Vital Signs    Vitals:   04/06/19 0757 04/06/19 0823 04/06/19 0825 04/06/19 0924  BP:  (!) 187/117 (!) 141/79   Pulse:   (!) 101 (!) 114  Resp:      Temp:      TempSrc:      SpO2: 94%     Weight:      Height:        Intake/Output Summary (Last 24 hours) at 04/06/2019 0935 Last data filed at 04/05/2019 1106 Gross per 24 hour  Intake 240 ml  Output 650 ml  Net -410 ml   Last 3 Weights 04/05/2019 12/02/2018 12/01/2018  Weight (lbs) 139 lb 15.9 oz 140 lb 10.5 oz 140 lb  Weight (kg) 63.5 kg 63.8 kg 63.504 kg      Telemetry    NSR at 97  - Personally  Reviewed  ECG     - Personally Reviewed  Physical Exam    GEN:  Extremely thin, cachectic gentleman.  He is somewhat disheveled.  He is completely disoriented. Neck: No JVD Cardiac: RRR, no murmurs, rubs, or gallops.  Respiratory: Clear to auscultation bilaterally. GI: Soft, nontender, non-distended  MS: No edema; No deformity. Neuro:  Nonfocal  Psych: Normal affect   Labs    High Sensitivity Troponin:   Recent Labs  Lab 04/03/19 0302 04/03/19 0650 04/04/19 1939 04/05/19 0236  TROPONINIHS 11 8 20* 29*      Chemistry Recent Labs  Lab 04/03/19 0302 04/04/19 1939 04/05/19 0525  NA 134* 137 137  K 3.0* 4.1 4.0  CL 100 99 102  CO2 19* 20* 18*  GLUCOSE 81 124* 72  BUN <5* 5* 7*  CREATININE 0.58* 0.83 0.87  CALCIUM 8.0* 9.2 8.2*  PROT 6.5 7.4 6.1*  ALBUMIN 3.5 4.1 3.0*  AST 54* 49* 34  ALT 28 31 23   ALKPHOS 78 104 81  BILITOT 0.8 2.2* 1.5*  GFRNONAA >60 >60 >60  GFRAA >60 >60 >60  ANIONGAP 15  18* 17*     Hematology Recent Labs  Lab 04/04/19 1939 04/05/19 0525  WBC 12.1* 9.3  RBC 4.52 3.78*  HGB 16.7 14.0  HCT 47.7 41.8  MCV 105.5* 110.6*  MCH 36.9* 37.0*  MCHC 35.0 33.5  RDW 13.2 13.3  PLT 204 208    BNPNo results for input(s): BNP, PROBNP in the last 168 hours.   DDimer No results for input(s): DDIMER in the last 168 hours.   Radiology    Dg Chest 2 View  Result Date: 04/04/2019 CLINICAL DATA:  68 year old male with chest pain. EXAM: CHEST - 2 VIEW COMPARISON:  Chest radiograph dated 04/03/2019 FINDINGS: Shallow inspiration with minimal bibasilar atelectasis. No focal consolidation, pleural effusion, pneumothorax. Right pleural thickening and density at the right costophrenic angle, likely scarring related to prior rib fractures. Mild cardiomegaly. Median sternotomy wires and CABG vascular clips. Atherosclerotic calcification of the aorta. IMPRESSION: 1. No active cardiopulmonary disease.  No interval change. 2. Mild cardiomegaly.  Electronically Signed   By: Anner Crete M.D.   On: 04/04/2019 19:22   Dg Thoracic Spine 2 View  Result Date: 04/05/2019 CLINICAL DATA:  Back pain. EXAM: THORACIC SPINE 2 VIEWS COMPARISON:  Chest x-ray 04/03/2019. MRI T-spine 11/23/2018. T-spine series 11/23/2018. FINDINGS: Diffuse osteopenia and degenerative change. Stable mid and lower thoracic vertebral body compression fractures are noted. No acute abnormality. Postsurgical changes in the chest. Vascular stent noted over the left upper chest. IMPRESSION: Diffuse osteopenia and degenerative change. Stable mid and lower thoracic vertebral body compression fractures are noted. No acute abnormality identified. Electronically Signed   By: Marcello Moores  Register   On: 04/05/2019 05:53   Ct Head Wo Contrast  Result Date: 04/04/2019 CLINICAL DATA:  Altered level of consciousness with nausea and dizziness EXAM: CT HEAD WITHOUT CONTRAST TECHNIQUE: Contiguous axial images were obtained from the base of the skull through the vertex without intravenous contrast. COMPARISON:  11/23/2018 FINDINGS: Brain: Right basal ganglia lacunar infarct is again seen. Mild atrophic changes and chronic ischemic changes are noted. Stable remote right thalamic infarct is noted. No findings to suggest acute hemorrhage, acute infarction or space-occupying mass lesion are noted. Vascular: No hyperdense vessel or unexpected calcification. Skull: Normal. Negative for fracture or focal lesion. Sinuses/Orbits: No acute finding. Other: None IMPRESSION: Chronic lacunar infarcts as described. No acute abnormality is noted. Stable atrophy and white matter ischemic change. Electronically Signed   By: Inez Catalina M.D.   On: 04/04/2019 21:16   Ct Abdomen Pelvis W Contrast  Result Date: 04/04/2019 CLINICAL DATA:  Nausea and vomiting EXAM: CT ABDOMEN AND PELVIS WITH CONTRAST TECHNIQUE: Multidetector CT imaging of the abdomen and pelvis was performed using the standard protocol following bolus  administration of intravenous contrast. CONTRAST:  131mL OMNIPAQUE IOHEXOL 300 MG/ML  SOLN COMPARISON:  CT 08/19/2018 FINDINGS: Lower chest: Lung bases demonstrate scarring or atelectasis at the posterior lung bases. Emphysema. No acute consolidation or pleural effusion. Borderline heart size. Coronary vascular calcification Hepatobiliary: No focal liver abnormality is seen. Status post cholecystectomy. No biliary dilatation. Pancreas: Unremarkable. No pancreatic ductal dilatation or surrounding inflammatory changes. Spleen: Normal in size without focal abnormality. Adrenals/Urinary Tract: Adrenal glands are unremarkable. Kidneys are normal, without renal calculi, focal lesion, or hydronephrosis. Bladder is slightly thick walled. Stomach/Bowel: The stomach is nonenlarged. No dilated small bowel. Negative for colon wall thickening. Negative appendix. Vascular/Lymphatic: Heavily calcified aorta. No aneurysm. No significantly enlarged lymph nodes. Reproductive: No masses.  Status post prostatectomy. Other: Negative for free air or free  fluid. Musculoskeletal: No acute or suspicious osseous abnormality. Intramedullary rodding left femur. Stable lucency at the tip of the femoral component without femoral head breech or collapse. IMPRESSION: 1. No CT evidence for acute intra-abdominal or pelvic abnormality. 2. Slightly thick-walled appearance of the urinary bladder, nonspecific, questionable for cystitis 3. Emphysema Electronically Signed   By: Donavan Foil M.D.   On: 04/04/2019 21:21    Cardiac Studies     Patient Profile     68 y.o. male   Assessment & Plan    1.  Coronary artery disease: The patient's symptoms are somewhat atypical but it is difficult to tell because he is a very poor historian and at present is very disoriented.  He did not know where he was or who I was despite multiple introductions. He had minimally elevated troponin levels but he is not a candidate for stress testing this morning  because of his inability to cooperate .  We will give him a heart healthy diet and will reschedule the Myoview once he is more stable and able to cooperate.  At present, he is not able to lie still in bed and this would completely ruin    the Myoview imaging   3.  Alcohol abuse: The patient may have some encephalopathy related to his chronic alcohol abuse.  This will be addressed by the medical team.       For questions or updates, please contact [place] Please consult [email or web address] for contact info under        Signed, Mertie Moores, MD  04/06/2019, 9:35 AM

## 2019-04-06 NOTE — Progress Notes (Signed)
Pt's very combative, agitated, threatened to hit staff.  Required 3 staff members to hold pt down for EKG. RN paged Doristine Bosworth, MD and received verbal order for soft restraints. MD to come assess pt.

## 2019-04-07 ENCOUNTER — Inpatient Hospital Stay (HOSPITAL_COMMUNITY): Payer: Medicare Other

## 2019-04-07 LAB — BASIC METABOLIC PANEL
Anion gap: 13 (ref 5–15)
BUN: 9 mg/dL (ref 8–23)
CO2: 18 mmol/L — ABNORMAL LOW (ref 22–32)
Calcium: 8.5 mg/dL — ABNORMAL LOW (ref 8.9–10.3)
Chloride: 107 mmol/L (ref 98–111)
Creatinine, Ser: 0.75 mg/dL (ref 0.61–1.24)
GFR calc Af Amer: >60 mL/min (ref >60)
GFR calc non Af Amer: >60 mL/min (ref >60)
Glucose, Bld: 118 mg/dL — ABNORMAL HIGH (ref 70–99)
Potassium: 4.1 mmol/L (ref 3.5–5.1)
Sodium: 138 mmol/L (ref 135–145)

## 2019-04-07 LAB — CBC WITH DIFFERENTIAL/PLATELET
Abs Immature Granulocytes: 0.04 10*3/uL (ref 0.00–0.07)
Basophils Absolute: 0 10*3/uL (ref 0.0–0.1)
Basophils Relative: 1 %
Eosinophils Absolute: 0.1 10*3/uL (ref 0.0–0.5)
Eosinophils Relative: 1 %
HCT: 43.9 % (ref 39.0–52.0)
Hemoglobin: 15.6 g/dL (ref 13.0–17.0)
Immature Granulocytes: 1 %
Lymphocytes Relative: 15 %
Lymphs Abs: 1.2 10*3/uL (ref 0.7–4.0)
MCH: 37.1 pg — ABNORMAL HIGH (ref 26.0–34.0)
MCHC: 35.5 g/dL (ref 30.0–36.0)
MCV: 104.5 fL — ABNORMAL HIGH (ref 80.0–100.0)
Monocytes Absolute: 0.7 10*3/uL (ref 0.1–1.0)
Monocytes Relative: 8 %
Neutro Abs: 6.3 10*3/uL (ref 1.7–7.7)
Neutrophils Relative %: 74 %
Platelets: 101 10*3/uL — ABNORMAL LOW (ref 150–400)
RBC: 4.2 MIL/uL — ABNORMAL LOW (ref 4.22–5.81)
RDW: 12.8 % (ref 11.5–15.5)
WBC: 8.3 10*3/uL (ref 4.0–10.5)
nRBC: 0 % (ref 0.0–0.2)

## 2019-04-07 LAB — MAGNESIUM: Magnesium: 1.6 mg/dL — ABNORMAL LOW (ref 1.7–2.4)

## 2019-04-07 MED ORDER — HYDRALAZINE HCL 20 MG/ML IJ SOLN
20.0000 mg | INTRAMUSCULAR | Status: DC | PRN
  Administered 2019-04-07: 20 mg via INTRAVENOUS
  Filled 2019-04-07: qty 1

## 2019-04-07 MED ORDER — MAGNESIUM SULFATE 2 GM/50ML IV SOLN
2.0000 g | Freq: Once | INTRAVENOUS | Status: AC
  Administered 2019-04-07: 2 g via INTRAVENOUS
  Filled 2019-04-07: qty 50

## 2019-04-07 MED ORDER — ORAL CARE MOUTH RINSE
15.0000 mL | Freq: Two times a day (BID) | OROMUCOSAL | Status: DC
  Administered 2019-04-07 – 2019-04-13 (×6): 15 mL via OROMUCOSAL

## 2019-04-07 MED ORDER — THIAMINE HCL 100 MG/ML IJ SOLN
INTRAVENOUS | Status: DC
  Administered 2019-04-07 – 2019-04-12 (×7): via INTRAVENOUS
  Filled 2019-04-07 (×13): qty 1000

## 2019-04-07 NOTE — Progress Notes (Signed)
Patient BP 177/102, not taking PO meds due to high  Risk for aspiration, Dr. Doristine Bosworth notified.  Order placed for IV hydralazine.

## 2019-04-07 NOTE — Progress Notes (Signed)
PROGRESS NOTE    [name] Pace  [id number] DOB: [birth year] DOA: 04/04/2019 PCP: Patient, No Pcp Per   Brief Narrative:  [name] Pace is a 68 y.o. male with history of CAD status post CABG, polysubstance abuse, prostate cancer presented to ED with complaint of chest pain for last few days without any shortness of breath or any radiation.  Upon arrival to ER, patient was afebrile chest x-ray does not show anything acute x-ray of the T-spine shows chronic changes. CT of the head was unremarkable.  EKG shows normal sinus rhythm.  High-sensitivity troponins were 20 and 29.  Lactic acid initially was 2.3 improved with 2 1.  UA shows features concerning for UTI.  WBC count was 12.1 COVID-19 was negative.  Given history of CAD patient admitted for chest pain and further work-up. ER provider on exam felt that patient had some abdominal discomfort for which CT abdomen was done which shows urinary bladder thickening.  Cardiology was consulted.  Assessment & Plan:   Principal Problem:   Chest pain Active Problems:   Alcohol abuse   CAD (coronary artery disease)   Elevated troponin   Severe alcohol use disorder (HCC)  Chest pain: Patient remains very lethargic, encephalopathic and actively withdrawing from alcohol.  Unknown whether he still has any chest pain however he did not have any ST-T wave changes on the EKG and cardiology is on board and plan is to do stress test once he is over the hump from his alcohol withdrawal.   Alcohol withdrawal/chronic alcohol dependence: Patient CIWA has remained over 10 since last 24 hours and he has received several doses of as needed IV Ativan.  Due to his lethargy and encephalopathy, he is unable to take anything p.o.  We will continue CIWA protocol with as needed Ativan.  Since he is unable to take p.o. thiamine and vitamin B12, will switch current fluids to banana bag.  UTI: Urine culture growing Proteus.  Continue ciprofloxacin and wait for final  susceptibilities.  Hypertension: Patient's blood pressure was significant elevated earlier, likely a component of alcohol withdrawal.  Placed on as needed hydralazine.  After 1 dose, his blood pressure dropped significantly.  I personally doubt accuracy of the readings.  We will monitor closely.  Atelectasis: Patient was tachypneic when I saw him.  A stat chest x-ray was done which shows decreased volumes in the bases.  This is likely due to him having active withdrawal causing shallow breathing.  We hope Ativan will calm him down.  DVT prophylaxis: Lovenox Code Status: Full code Family Communication: None present at bedside. Disposition Plan: To be determined  Estimated body mass index is 17.19 kg/m as calculated from the following:   Height as of this encounter: 6\' 3"  (1.905 m).   Weight as of this encounter: 62.4 kg.      Nutritional status:               Consultants:   Cardiology  Procedures:   None  Antimicrobials:   Ciprofloxacin   Subjective: Seen and examined when received page from nurse this morning that he had yellow mucus and he was tachypneic.  Patient was completely lethargic and tachypneic with shallow breathing.   Objective: Vitals:   04/07/19 1345 04/07/19 1400 04/07/19 1415 04/07/19 1430  BP: (!) 105/58 (!) 99/53 (!) 111/59 (!) 122/101  Pulse: 77 79 81 82  Resp: (!) 28 (!) 32 (!) 32 20  Temp:      TempSrc:  SpO2: 97% 96% 98% 96%  Weight:      Height:        Intake/Output Summary (Last 24 hours) at 04/07/2019 1447 Last data filed at 04/07/2019 1400 Gross per 24 hour  Intake 2841.51 ml  Output 975 ml  Net 1866.51 ml   Filed Weights   04/05/19 0600 04/07/19 [id number]  Weight: 63.5 kg 62.4 kg    Examination:  General exam: Appears encephalopathic and tachypneic Respiratory system: Tachypneic with shallow breathing and decreased breath sounds at the right base Cardiovascular system: S1 & S2 heard, tachycardia no JVD, murmurs, rubs,  gallops or clicks. No pedal edema. Gastrointestinal system: Abdomen is nondistended, soft and nontender. No organomegaly or masses felt. Normal bowel sounds heard. Central nervous system: Lethargic Extremities: Moving all extremities spontaneously Skin: No rashes, lesions or ulcers.  Psychiatry: Unable to assess due to lethargy   Data Reviewed: I have personally reviewed following labs and imaging studies  CBC: Recent Labs  Lab 04/04/19 1939 04/05/19 0525 04/07/19 0405  WBC 12.1* 9.3 8.3  NEUTROABS  --   --  6.3  HGB 16.7 14.0 15.6  HCT 47.7 41.8 43.9  MCV 105.5* 110.6* 104.5*  PLT 204 208 [id number]*   Basic Metabolic Panel: Recent Labs  Lab 04/03/19 0302 04/04/19 1939 04/05/19 0525 04/07/19 0405  NA 134* 137 137 138  K 3.0* 4.1 4.0 4.1  CL 100 99 102 107  CO2 19* 20* 18* 18*  GLUCOSE 81 124* 72 118*  BUN <5* 5* 7* 9  CREATININE 0.58* 0.83 0.87 0.75  CALCIUM 8.0* 9.2 8.2* 8.5*  MG  --   --  1.7 1.6*  PHOS  --   --  4.0  --    GFR: Estimated Creatinine Clearance: 80.2 mL/min (by C-G formula based on SCr of 0.75 mg/dL). Liver Function Tests: Recent Labs  Lab 04/03/19 0302 04/04/19 1939 04/05/19 0525  AST 54* 49* 34  ALT 28 31 23   ALKPHOS 78 104 81  BILITOT 0.8 2.2* 1.5*  PROT 6.5 7.4 6.1*  ALBUMIN 3.5 4.1 3.0*   Recent Labs  Lab 04/04/19 1939  LIPASE 24   Recent Labs  Lab 04/04/19 1939  AMMONIA 24   Coagulation Profile: Recent Labs  Lab 04/04/19 1939 04/05/19 0236  INR 0.9 1.0   Cardiac Enzymes: No results for input(s): CKTOTAL, CKMB, CKMBINDEX, TROPONINI in the last 168 hours. BNP (last 3 results) No results for input(s): PROBNP in the last 8760 hours. HbA1C: No results for input(s): HGBA1C in the last 72 hours. CBG: No results for input(s): GLUCAP in the last 168 hours. Lipid Profile: Recent Labs    04/06/19 0831  CHOL 218*  HDL 109  LDLCALC 89  TRIG 100  CHOLHDL 2.0   Thyroid Function Tests: No results for input(s): TSH, T4TOTAL,  FREET4, T3FREE, THYROIDAB in the last 72 hours. Anemia Panel: No results for input(s): VITAMINB12, FOLATE, FERRITIN, TIBC, IRON, RETICCTPCT in the last 72 hours. Sepsis Labs: Recent Labs  Lab 04/04/19 1930 04/05/19 0236  LATICACIDVEN 2.3* 1.0    Recent Results (from the past 240 hour(s))  SARS CORONAVIRUS 2 (TAT 6-24 HRS) Nasopharyngeal Nasopharyngeal Swab     Status: None   Collection Time: 04/03/19  6:35 AM   Specimen: Nasopharyngeal Swab  Result Value Ref Range Status   SARS Coronavirus 2 NEGATIVE NEGATIVE Final    Comment: (NOTE) SARS-CoV-2 target nucleic acids are NOT DETECTED. The SARS-CoV-2 RNA is generally detectable in upper and lower respiratory specimens during the  acute phase of infection. Negative results do not preclude SARS-CoV-2 infection, do not rule out co-infections with other pathogens, and should not be used as the sole basis for treatment or other patient management decisions. Negative results must be combined with clinical observations, patient history, and epidemiological information. The expected result is Negative. Fact Sheet for Patients: SugarRoll.be Fact Sheet for Healthcare Providers: https://www.woods-mathews.com/ This test is not yet approved or cleared by the [place] FDA and  has been authorized for detection and/or diagnosis of SARS-CoV-2 by FDA under an Emergency Use Authorization (EUA). This EUA will remain  in effect (meaning this test can be used) for the duration of the COVID-19 declaration under Section 56 4(b)(1) of the Act, 21 U.S.C. section 360bbb-3(b)(1), unless the authorization is terminated or revoked sooner. Performed at [facility], [place] [address], [place], [place] [address]   Blood Culture (routine x 2)     Status: None (Preliminary result)   Collection Time: 04/04/19  7:00 PM   Specimen: BLOOD  Result Value Ref Range Status   Specimen Description BLOOD BLOOD LEFT HAND   Final   Special Requests AEROBIC BOTTLE ONLY Blood Culture adequate volume  Final   Culture   Final    NO GROWTH 2 DAYS Performed at [facility], [place] [address], [place], [place] [address]    Report Status PENDING  Incomplete  Blood Culture (routine x 2)     Status: None (Preliminary result)   Collection Time: 04/04/19  8:30 PM   Specimen: BLOOD LEFT ARM  Result Value Ref Range Status   Specimen Description BLOOD LEFT ARM  Final   Special Requests   Final    BOTTLES DRAWN AEROBIC AND ANAEROBIC Blood Culture results may not be optimal due to an inadequate volume of blood received in culture bottles   Culture   Final    NO GROWTH 3 DAYS Performed at [facility], [place] [address], [place], [place] [address]    Report Status PENDING  Incomplete  SARS CORONAVIRUS 2 (TAT 6-24 HRS) Nasopharyngeal Nasopharyngeal Swab     Status: None   Collection Time: 04/04/19 10:27 PM   Specimen: Nasopharyngeal Swab  Result Value Ref Range Status   SARS Coronavirus 2 NEGATIVE NEGATIVE Final    Comment: (NOTE) SARS-CoV-2 target nucleic acids are NOT DETECTED. The SARS-CoV-2 RNA is generally detectable in upper and lower respiratory specimens during the acute phase of infection. Negative results do not preclude SARS-CoV-2 infection, do not rule out co-infections with other pathogens, and should not be used as the sole basis for treatment or other patient management decisions. Negative results must be combined with clinical observations, patient history, and epidemiological information. The expected result is Negative. Fact Sheet for Patients: SugarRoll.be Fact Sheet for Healthcare Providers: https://www.woods-mathews.com/ This test is not yet approved or cleared by the [place] FDA and  has been authorized for detection and/or diagnosis of SARS-CoV-2 by FDA under an Emergency Use Authorization (EUA). This EUA will remain  in effect (meaning  this test can be used) for the duration of the COVID-19 declaration under Section 56 4(b)(1) of the Act, 21 U.S.C. section 360bbb-3(b)(1), unless the authorization is terminated or revoked sooner. Performed at [facility], [place] [address], [place],  [address]   Urine culture     Status: Abnormal (Preliminary result)   Collection Time: 04/05/19  2:36 AM   Specimen: In/Out Cath Urine  Result Value Ref Range Status  Specimen Description IN/OUT CATH URINE  Final   Special Requests NONE  Final   Culture (A)  Final    >=100,000 COLONIES/mL KLEBSIELLA PNEUMONIAE SUSCEPTIBILITIES TO FOLLOW Performed at [facility], [address], [place], [place] [address]    Report Status PENDING  Incomplete      Radiology Studies: Dg Chest Port 1 View  Result Date: 04/07/2019 CLINICAL DATA:  Tachypnea.  Chest pain. EXAM: PORTABLE CHEST 1 VIEW COMPARISON:  One-view chest x-ray 04/04/2019 FINDINGS: Heart is enlarged. Aeration is slightly improved. Mild pulmonary vascular congestion persists. Lung volumes are low. No focal airspace disease is present. Healed right-sided rib fractures are noted. Asymmetric degenerative changes are again noted in the right shoulder. IMPRESSION: 1. Cardiomegaly without failure. 2. Low lung volumes. Electronically Signed   By: San Morelle M.D.   On: 04/07/2019 10:36    Scheduled Meds: . aspirin  324 mg Oral Once  . aspirin EC  81 mg Oral Daily  . atorvastatin  40 mg Oral q1800  . chlordiazePOXIDE  25 mg Oral TID  . folic acid  1 mg Oral Daily  . lisinopril  10 mg Oral Daily  . mouth rinse  15 mL Mouth Rinse BID  . mometasone-formoterol  2 puff Inhalation BID  . multivitamin with minerals  1 tablet Oral Daily  . pantoprazole  40 mg Oral Q0600  . thiamine  100 mg Oral Daily   Or  . thiamine  100 mg Intravenous Daily  . umeclidinium bromide  1 puff Inhalation Daily   Continuous Infusions: . ciprofloxacin    . ciprofloxacin 400 mg  (04/07/19 [id number])  . dextrose 5 % and 0.9 % NaCl with KCl 20 mEq/L 125 mL/hr at 04/07/19 1400     LOS: 2 days   Time spent: 29 minutes   Darliss Cheney, MD [facility]  04/07/2019, 2:47 PM   To contact the attending provider between 7A-7P or the covering provider during after hours 7P-7A, please log into the web site [email or web address] and use password [email or web address].

## 2019-04-07 NOTE — Progress Notes (Signed)
Progress Note  Patient Name: [name] Pace Date of Encounter: 04/07/2019  Primary Cardiologist: Radford Pax   Subjective   68 year old gentleman with a history of coronary artery disease-status post CABG, chronic alcohol abuse, COPD, ongoing cigarette smoking.  He has had multiple ER visits for COPD exacerbations.  He also has had frequent falls. Been scheduled for a nuclear stress test today.  He remains unresponsive to questions.  He is snoring.  He is moving spontaneously in bed but will not respond to any questions.  He clearly has encephalopathy.    Inpatient Medications    Scheduled Meds: . aspirin  324 mg Oral Once  . aspirin EC  81 mg Oral Daily  . atorvastatin  40 mg Oral q1800  . chlordiazePOXIDE  25 mg Oral TID  . folic acid  1 mg Oral Daily  . lisinopril  10 mg Oral Daily  . mometasone-formoterol  2 puff Inhalation BID  . multivitamin with minerals  1 tablet Oral Daily  . pantoprazole  40 mg Oral Q0600  . thiamine  100 mg Oral Daily   Or  . thiamine  100 mg Intravenous Daily  . umeclidinium bromide  1 puff Inhalation Daily   Continuous Infusions: . ciprofloxacin    . ciprofloxacin 400 mg (04/07/19 [id number])  . dextrose 5 % and 0.9 % NaCl with KCl 20 mEq/L 125 mL/hr at 04/07/19 0500   PRN Meds: acetaminophen, albuterol, LORazepam **OR** LORazepam, ondansetron (ZOFRAN) IV, spiritus frumenti   Vital Signs    Vitals:   04/06/19 2017 04/06/19 2226 04/07/19 0638 04/07/19 0750  BP:  134/90 (!) 151/95 (!) 169/105  Pulse:    99  Resp:    (!) 26  Temp: (!) 97.5 F (36.4 C)     TempSrc: Axillary     SpO2:      Weight:   62.4 kg   Height:        Intake/Output Summary (Last 24 hours) at 04/07/2019 0912 Last data filed at 04/07/2019 [id number] Gross per 24 hour  Intake 2171.31 ml  Output 775 ml  Net 1396.31 ml   Last 3 Weights 04/07/2019 04/05/2019 12/02/2018  Weight (lbs) 137 lb 9.1 oz 139 lb 15.9 oz 140 lb 10.5 oz  Weight (kg) 62.4 kg 63.5 kg 63.8 kg      Telemetry     NSR at 97  - Personally Reviewed  ECG     - Personally Reviewed  Physical Exam    Physical Exam: Blood pressure (!) 169/105, pulse 99, temperature (!) 97.5 F (36.4 C), temperature source Axillary, resp. rate (!) 26, height 6\' 3"  (1.905 m), weight 62.4 kg, SpO2 94 %.  GEN: Extremely thin, cachectic elderly gentleman.  He appears to be older than his age of 72. HEENT: Normal NECK: No JVD; No carotid bruits LYMPHATICS: No lymphadenopathy CARDIAC: Regular rate, no significant murmurs. RESPIRATORY:  Clear to auscultation without rales, wheezing or rhonchi  ABDOMEN: Soft, non-tender, non-distended MUSCULOSKELETAL:  No edema; No deformity  SKIN: Warm and dry NEUROLOGIC: Will not respond to questions.  He is breathing spontaneously.  He does respond to painful stimuli but then quickly goes back to sleep.  Unable to assess any psychiatric status.   Labs    High Sensitivity Troponin:   Recent Labs  Lab 04/03/19 0302 04/03/19 0650 04/04/19 1939 04/05/19 0236  TROPONINIHS 11 8 20* 29*      Chemistry Recent Labs  Lab 04/03/19 0302 04/04/19 1939 04/05/19 0525 04/07/19 0405  NA 134*  137 137 138  K 3.0* 4.1 4.0 4.1  CL 100 99 102 107  CO2 19* 20* 18* 18*  GLUCOSE 81 124* 72 118*  BUN <5* 5* 7* 9  CREATININE 0.58* 0.83 0.87 0.75  CALCIUM 8.0* 9.2 8.2* 8.5*  PROT 6.5 7.4 6.1*  --   ALBUMIN 3.5 4.1 3.0*  --   AST 54* 49* 34  --   ALT 28 31 23   --   ALKPHOS 78 104 81  --   BILITOT 0.8 2.2* 1.5*  --   GFRNONAA >60 >60 >60 >60  GFRAA >60 >60 >60 >60  ANIONGAP 15 18* 17* 13     Hematology Recent Labs  Lab 04/04/19 1939 04/05/19 0525 04/07/19 0405  WBC 12.1* 9.3 8.3  RBC 4.52 3.78* 4.20*  HGB 16.7 14.0 15.6  HCT 47.7 41.8 43.9  MCV 105.5* 110.6* 104.5*  MCH 36.9* 37.0* 37.1*  MCHC 35.0 33.5 35.5  RDW 13.2 13.3 12.8  PLT 204 208 101*    BNPNo results for input(s): BNP, PROBNP in the last 168 hours.   DDimer No results for input(s): DDIMER in the last 168  hours.   Radiology    No results found.  Cardiac Studies     Patient Profile     68 y.o. male   Assessment & Plan    1.  Coronary artery disease: The patient has minimally elevated troponin levels.  He has minimal cooperation.  At this point he is not a candidate for stress testing.  He needs to improve significantly before he is a candidate for any cardiac testing.   3.  Alcohol abuse: The patient is basically unresponsive.  Further evaluation per internal medicine team.       For questions or updates, please contact [place] Please consult [email or web address] for contact info under        Signed, Mertie Moores, MD  04/07/2019, 9:12 AM

## 2019-04-08 LAB — BASIC METABOLIC PANEL
Anion gap: 12 (ref 5–15)
BUN: <5 mg/dL — ABNORMAL LOW (ref 8–23)
CO2: 17 mmol/L — ABNORMAL LOW (ref 22–32)
Calcium: 8 mg/dL — ABNORMAL LOW (ref 8.9–10.3)
Chloride: 111 mmol/L (ref 98–111)
Creatinine, Ser: 0.74 mg/dL (ref 0.61–1.24)
GFR calc Af Amer: >60 mL/min (ref >60)
GFR calc non Af Amer: >60 mL/min (ref >60)
Glucose, Bld: 80 mg/dL (ref 70–99)
Potassium: 3.7 mmol/L (ref 3.5–5.1)
Sodium: 140 mmol/L (ref 135–145)

## 2019-04-08 LAB — CBC WITH DIFFERENTIAL/PLATELET
Abs Immature Granulocytes: 0.04 10*3/uL (ref 0.00–0.07)
Basophils Absolute: 0.1 10*3/uL (ref 0.0–0.1)
Basophils Relative: 1 %
Eosinophils Absolute: 0.2 10*3/uL (ref 0.0–0.5)
Eosinophils Relative: 2 %
HCT: 43.5 % (ref 39.0–52.0)
Hemoglobin: 15.6 g/dL (ref 13.0–17.0)
Immature Granulocytes: 0 %
Lymphocytes Relative: 13 %
Lymphs Abs: 1.3 10*3/uL (ref 0.7–4.0)
MCH: 37.1 pg — ABNORMAL HIGH (ref 26.0–34.0)
MCHC: 35.9 g/dL (ref 30.0–36.0)
MCV: 103.3 fL — ABNORMAL HIGH (ref 80.0–100.0)
Monocytes Absolute: 1.2 10*3/uL — ABNORMAL HIGH (ref 0.1–1.0)
Monocytes Relative: 12 %
Neutro Abs: 7.3 10*3/uL (ref 1.7–7.7)
Neutrophils Relative %: 72 %
Platelets: 191 10*3/uL (ref 150–400)
RBC: 4.21 MIL/uL — ABNORMAL LOW (ref 4.22–5.81)
RDW: 12.8 % (ref 11.5–15.5)
WBC: 10 10*3/uL (ref 4.0–10.5)
nRBC: 0 % (ref 0.0–0.2)

## 2019-04-08 LAB — URINE CULTURE: Culture: >=100000 — AB

## 2019-04-08 LAB — MAGNESIUM: Magnesium: 1.5 mg/dL — ABNORMAL LOW (ref 1.7–2.4)

## 2019-04-08 MED ORDER — UMECLIDINIUM BROMIDE 62.5 MCG/INH IN AEPB
1.0000 | INHALATION_SPRAY | Freq: Every day | RESPIRATORY_TRACT | Status: DC
  Administered 2019-04-08 – 2019-04-13 (×6): 1 via RESPIRATORY_TRACT

## 2019-04-08 MED ORDER — LORAZEPAM 1 MG PO TABS: 1.0000 mg | ORAL_TABLET | ORAL | Status: AC | PRN

## 2019-04-08 MED ORDER — CIPROFLOXACIN HCL 500 MG PO TABS
500.0000 mg | ORAL_TABLET | Freq: Two times a day (BID) | ORAL | Status: AC
  Administered 2019-04-08 – 2019-04-11 (×8): 500 mg via ORAL
  Filled 2019-04-08 (×8): qty 1

## 2019-04-08 MED ORDER — MAGNESIUM SULFATE 2 GM/50ML IV SOLN
2.0000 g | Freq: Once | INTRAVENOUS | Status: AC
  Administered 2019-04-08: 2 g via INTRAVENOUS
  Filled 2019-04-08: qty 50

## 2019-04-08 MED ORDER — LORAZEPAM 2 MG/ML IJ SOLN
1.0000 mg | INTRAMUSCULAR | Status: AC | PRN
  Administered 2019-04-08 – 2019-04-09 (×4): 2 mg via INTRAVENOUS
  Administered 2019-04-09: 4 mg via INTRAVENOUS
  Administered 2019-04-09: 1 mg via INTRAVENOUS
  Filled 2019-04-08 (×3): qty 1
  Filled 2019-04-08: qty 2
  Filled 2019-04-08 (×2): qty 1

## 2019-04-08 NOTE — Progress Notes (Signed)
Progress Note  Patient Name: [name] Pace Date of Encounter: 04/08/2019  Primary Cardiologist: Fransico Him, MD   Subjective   68 year old gentleman with a history of coronary artery disease-status post CABG, chronic alcohol abuse, COPD, ongoing cigarette smoking, ETOH & drug use.  He has had multiple ER visits for COPD exacerbations.  He also has had frequent falls.  Admitted 10/30 with chest pain. MV planned but canceled due to pt combative due to ETOH withdrawal, encephalopathy. +UTI  11/02: responds to verbal, answered to his name and said CABG was 1980. Then speech deteriorated, unable to understand   Inpatient Medications    Scheduled Meds:  aspirin  324 mg Oral Once   aspirin EC  81 mg Oral Daily   atorvastatin  40 mg Oral q1800   chlordiazePOXIDE  25 mg Oral TID   folic acid  1 mg Oral Daily   lisinopril  10 mg Oral Daily   mouth rinse  15 mL Mouth Rinse BID   mometasone-formoterol  2 puff Inhalation BID   multivitamin with minerals  1 tablet Oral Daily   pantoprazole  40 mg Oral Q0600   thiamine  100 mg Oral Daily   Or   thiamine  100 mg Intravenous Daily   umeclidinium bromide  1 puff Inhalation Daily   Continuous Infusions:  ciprofloxacin Stopped (04/07/19 1830)   banana bag IV 1000 mL 125 mL/hr at 04/08/19 0400   PRN Meds: acetaminophen, albuterol, hydrALAZINE, ondansetron (ZOFRAN) IV, spiritus frumenti   Vital Signs    Vitals:   04/08/19 0330 04/08/19 0402 04/08/19 0738 04/08/19 0750  BP: (!) 171/103 (!) 158/104 (!) 106/91   Pulse: 88  88   Resp:  (!) 25 (!) 22   Temp:      TempSrc:      SpO2:   99% 100%  Weight:      Height:        Intake/Output Summary (Last 24 hours) at 04/08/2019 1011 Last data filed at 04/08/2019 0700 Gross per 24 hour  Intake 2752.76 ml  Output 1900 ml  Net 852.76 ml   Last 3 Weights 04/08/2019 04/07/2019 04/05/2019  Weight (lbs) 142 lb 6.7 oz 137 lb 9.1 oz 139 lb 15.9 oz  Weight (kg) 64.6 kg 62.4 kg  63.5 kg      Telemetry    SR, PACs and PVCs  - Personally Reviewed  ECG    None today - Personally Reviewed  Physical Exam    Physical Exam: Blood pressure (!) 106/91, pulse 88, temperature 98.6 F (37 C), temperature source Axillary, resp. rate (!) 22, height 6\' 3"  (1.905 m), weight 64.6 kg, SpO2 100 %.  General: thin, frail, male in no acute distress Head: Eyes PERRLA, Head normocephalic and atraumatic Lungs: clear bilaterally to auscultation. Heart: HRRR S1 S2, without rub or gallop. No murmur. 4/4 extremity pulses are 2+ & equal. No JVD. Abdomen: Bowel sounds are present, abdomen soft and non-tender without masses or  hernias noted. Msk: Normal strength and tone for age. Extremities: No clubbing, cyanosis or edema.    Skin:  No rashes or lesions noted. Neuro: responded to a couple of questions, then became agitated and unable to understand him   Labs    High Sensitivity Troponin:   Recent Labs  Lab 04/03/19 0302 04/03/19 0650 04/04/19 1939 04/05/19 0236  TROPONINIHS 11 8 20* 29*      Chemistry Recent Labs  Lab 04/03/19 0302 04/04/19 1939 04/05/19 0525 04/07/19 0405 04/08/19  0409  NA 134* 137 137 138 140  K 3.0* 4.1 4.0 4.1 3.7  CL 100 99 102 107 111  CO2 19* 20* 18* 18* 17*  GLUCOSE 81 124* 72 118* 80  BUN <5* 5* 7* 9 <5*  CREATININE 0.58* 0.83 0.87 0.75 0.74  CALCIUM 8.0* 9.2 8.2* 8.5* 8.0*  PROT 6.5 7.4 6.1*  --   --   ALBUMIN 3.5 4.1 3.0*  --   --   AST 54* 49* 34  --   --   ALT 28 31 23   --   --   ALKPHOS 78 104 81  --   --   BILITOT 0.8 2.2* 1.5*  --   --   GFRNONAA >60 >60 >60 >60 >60  GFRAA >60 >60 >60 >60 >60  ANIONGAP 15 18* 17* 13 12     Hematology Recent Labs  Lab 04/05/19 0525 04/07/19 0405 04/08/19 0409  WBC 9.3 8.3 10.0  RBC 3.78* 4.20* 4.21*  HGB 14.0 15.6 15.6  HCT 41.8 43.9 43.5  MCV 110.6* 104.5* 103.3*  MCH 37.0* 37.1* 37.1*  MCHC 33.5 35.5 35.9  RDW 13.3 12.8 12.8  PLT 208 101* 191   Lab Results  Component  Value Date   CHOL 218 (H) 04/06/2019   HDL 109 04/06/2019   LDLCALC 89 04/06/2019   TRIG 100 04/06/2019   CHOLHDL 2.0 04/06/2019   Lab Results  Component Value Date   TSH 0.343 (L) 12/01/2018   Lab Results  Component Value Date   ALT 23 04/05/2019   AST 34 04/05/2019   ALKPHOS 81 04/05/2019   BILITOT 1.5 (H) 04/05/2019   Lab Results  Component Value Date   HGBA1C 4.5 (L) 12/01/2018   Drugs of Abuse ETOH level initially 308    Component Value Date/Time   LABOPIA NONE DETECTED 04/05/2019 0629   COCAINSCRNUR NONE DETECTED 04/05/2019 0629   LABBENZ POSITIVE (A) 04/05/2019 0629   AMPHETMU NONE DETECTED 04/05/2019 0629   THCU NONE DETECTED 04/05/2019 0629   LABBARB NONE DETECTED 04/05/2019 0629      Radiology    Dg Chest Port 1 View  Result Date: 04/07/2019 CLINICAL DATA:  Tachypnea.  Chest pain. EXAM: PORTABLE CHEST 1 VIEW COMPARISON:  One-view chest x-ray 04/04/2019 FINDINGS: Heart is enlarged. Aeration is slightly improved. Mild pulmonary vascular congestion persists. Lung volumes are low. No focal airspace disease is present. Healed right-sided rib fractures are noted. Asymmetric degenerative changes are again noted in the right shoulder. IMPRESSION: 1. Cardiomegaly without failure. 2. Low lung volumes. Electronically Signed   By: San Morelle M.D.   On: 04/07/2019 10:36    Cardiac Studies   MV when stable  Patient Profile     68 y.o. male w/ hx CABG (?1980), ETOH, drug abuse, was admitted 10/30 with chest pain, w/d sx and encephalopathy.  Assessment & Plan    1.  Coronary artery disease: - minimal elevation troponin - not a current candidate for stress testing - MD advise if MV should be done as inpatient or f/u as outpt  2.  Alcohol abuse: - on CIVA protocol - per IM   For questions or updates, please contact [place] Please consult [email or web address] for contact info under        Signed, Rosaria Ferries, PA-C  04/08/2019, 10:11 AM

## 2019-04-08 NOTE — Progress Notes (Signed)
RN notified by central monitoring that patient had 7 beat run of vtach at 1435. Patient asymptomatic and resting in bed. MD notified. Will continue to monitor.

## 2019-04-08 NOTE — Progress Notes (Signed)
PROGRESS NOTE    [name] Pace  [id number] DOB: 12-Oct-1950 DOA: 04/04/2019 PCP: Patient, No Pcp Per   Brief Narrative:  [name] Pace is a 68 y.o. male with history of CAD status post CABG, polysubstance abuse, prostate cancer presented to ED with complaint of chest pain for last few days without any shortness of breath or any radiation.  Upon arrival to ER, patient was afebrile chest x-ray does not show anything acute x-ray of the T-spine shows chronic changes. CT of the head was unremarkable.  EKG shows normal sinus rhythm.  High-sensitivity troponins were 20 and 29.  Lactic acid initially was 2.3 improved with 2 1.  UA shows features concerning for UTI.  WBC count was 12.1 COVID-19 was negative.  Given history of CAD patient admitted for chest pain and further work-up. ER provider on exam felt that patient had some abdominal discomfort for which CT abdomen was done which shows urinary bladder thickening.  Cardiology was consulted.  ACS ruled out.  Plan was for nuclear stress test on Saturday however overnight, patient started having full-blown alcohol withdrawal so this was canceled.  Assessment & Plan:   Principal Problem:   Chest pain Active Problems:   Alcohol abuse   CAD (coronary artery disease)   Elevated troponin   Severe alcohol use disorder (HCC)  Chest pain: Patient once again remains lethargic but looks very comfortable compared to yesterday.  Defer to cardiology for timing and decision of stress test.  Alcohol withdrawal/chronic alcohol dependence: Patient's CIWA has once again remained over 10 for last 24 hours but has improved compared to day before and he has required fewer doses of 80 when compared to the day before.  He also has been given oral Librium.  This morning, he is still obtunded but not tachypneic and looks comfortable.  Continue banana bag, CIWA protocol and as needed Ativan.   UTI: Urine culture growing Proteus.  Continue ciprofloxacin and wait for final  susceptibilities.  Hypertension: Blood pressure better today.  Continue as needed hydralazine.  Atelectasis: Not hypoxic anymore.  Hypomagnesemia: We will replace today.  Recheck in the morning.  DVT prophylaxis: Lovenox Code Status: Full code Family Communication: None present at bedside. Disposition Plan: To be determined  Estimated body mass index is 17.8 kg/m as calculated from the following:   Height as of this encounter: 6\' 3"  (1.905 m).   Weight as of this encounter: 64.6 kg.      Nutritional status:               Consultants:   Cardiology  Procedures:   None  Antimicrobials:   Ciprofloxacin   Subjective: Patient seen and examined.  He was obtunded once again however he was comfortable and not tachypneic like he was yesterday.  Objective: Vitals:   04/08/19 0330 04/08/19 0402 04/08/19 0738 04/08/19 0750  BP: (!) 171/103 (!) 158/104 (!) 106/91   Pulse: 88  88   Resp:  (!) 25 (!) 22   Temp:      TempSrc:      SpO2:   99% 100%  Weight:      Height:        Intake/Output Summary (Last 24 hours) at 04/08/2019 1157 Last data filed at 04/08/2019 0900 Gross per 24 hour  Intake 2852.76 ml  Output 1700 ml  Net 1152.76 ml   Filed Weights   04/05/19 0600 04/07/19 0638 04/08/19 0327  Weight: 63.5 kg 62.4 kg 64.6 kg    Examination:  General exam: Appears calm and comfortable but obtunded Respiratory system: Clear to auscultation. Respiratory effort normal. Cardiovascular system: S1 & S2 heard, RRR. No JVD, murmurs, rubs, gallops or clicks. No pedal edema. Gastrointestinal system: Abdomen is nondistended, soft and nontender. No organomegaly or masses felt. Normal bowel sounds heard. Central nervous system : obtunded. Extremities: Symmetric 5 x 5 power. Skin: No rashes, lesions or ulcers.  Psychiatry: Unable to assess due to reasons mentioned above.  Data Reviewed: I have personally reviewed following labs and imaging studies  CBC: Recent  Labs  Lab 04/04/19 1939 04/05/19 0525 04/07/19 0405 04/08/19 0409  WBC 12.1* 9.3 8.3 10.0  NEUTROABS  --   --  6.3 7.3  HGB 16.7 14.0 15.6 15.6  HCT 47.7 41.8 43.9 43.5  MCV 105.5* 110.6* 104.5* 103.3*  PLT 204 208 101* [id number]   Basic Metabolic Panel: Recent Labs  Lab 04/03/19 0302 04/04/19 1939 04/05/19 0525 04/07/19 0405 04/08/19 0409 04/08/19 0939  NA 134* 137 137 138 140  --   K 3.0* 4.1 4.0 4.1 3.7  --   CL 100 99 102 107 111  --   CO2 19* 20* 18* 18* 17*  --   GLUCOSE 81 124* 72 118* 80  --   BUN <5* 5* 7* 9 <5*  --   CREATININE 0.58* 0.83 0.87 0.75 0.74  --   CALCIUM 8.0* 9.2 8.2* 8.5* 8.0*  --   MG  --   --  1.7 1.6*  --  1.5*  PHOS  --   --  4.0  --   --   --    GFR: Estimated Creatinine Clearance: 83 mL/min (by C-G formula based on SCr of 0.74 mg/dL). Liver Function Tests: Recent Labs  Lab 04/03/19 0302 04/04/19 1939 04/05/19 0525  AST 54* 49* 34  ALT 28 31 23   ALKPHOS 78 104 81  BILITOT 0.8 2.2* 1.5*  PROT 6.5 7.4 6.1*  ALBUMIN 3.5 4.1 3.0*   Recent Labs  Lab 04/04/19 1939  LIPASE 24   Recent Labs  Lab 04/04/19 1939  AMMONIA 24   Coagulation Profile: Recent Labs  Lab 04/04/19 1939 04/05/19 0236  INR 0.9 1.0   Cardiac Enzymes: No results for input(s): CKTOTAL, CKMB, CKMBINDEX, TROPONINI in the last 168 hours. BNP (last 3 results) No results for input(s): PROBNP in the last 8760 hours. HbA1C: No results for input(s): HGBA1C in the last 72 hours. CBG: No results for input(s): GLUCAP in the last 168 hours. Lipid Profile: Recent Labs    04/06/19 0831  CHOL 218*  HDL 109  LDLCALC 89  TRIG 100  CHOLHDL 2.0   Thyroid Function Tests: No results for input(s): TSH, T4TOTAL, FREET4, T3FREE, THYROIDAB in the last 72 hours. Anemia Panel: No results for input(s): VITAMINB12, FOLATE, FERRITIN, TIBC, IRON, RETICCTPCT in the last 72 hours. Sepsis Labs: Recent Labs  Lab 04/04/19 1930 04/05/19 0236  LATICACIDVEN 2.3* 1.0    Recent  Results (from the past 240 hour(s))  SARS CORONAVIRUS 2 (TAT 6-24 HRS) Nasopharyngeal Nasopharyngeal Swab     Status: None   Collection Time: 04/03/19  6:35 AM   Specimen: Nasopharyngeal Swab  Result Value Ref Range Status   SARS Coronavirus 2 NEGATIVE NEGATIVE Final    Comment: (NOTE) SARS-CoV-2 target nucleic acids are NOT DETECTED. The SARS-CoV-2 RNA is generally detectable in upper and lower respiratory specimens during the acute phase of infection. Negative results do not preclude SARS-CoV-2 infection, do not rule out co-infections with other  pathogens, and should not be used as the sole basis for treatment or other patient management decisions. Negative results must be combined with clinical observations, patient history, and epidemiological information. The expected result is Negative. Fact Sheet for Patients: SugarRoll.be Fact Sheet for Healthcare Providers: https://www.woods-mathews.com/ This test is not yet approved or cleared by the [place] FDA and  has been authorized for detection and/or diagnosis of SARS-CoV-2 by FDA under an Emergency Use Authorization (EUA). This EUA will remain  in effect (meaning this test can be used) for the duration of the COVID-19 declaration under Section 56 4(b)(1) of the Act, 21 U.S.C. section 360bbb-3(b)(1), unless the authorization is terminated or revoked sooner. Performed at [facility], [place] [address], [place], [place] [address]   Blood Culture (routine x 2)     Status: None (Preliminary result)   Collection Time: 04/04/19  7:00 PM   Specimen: BLOOD  Result Value Ref Range Status   Specimen Description BLOOD BLOOD LEFT HAND  Final   Special Requests AEROBIC BOTTLE ONLY Blood Culture adequate volume  Final   Culture   Final    NO GROWTH 3 DAYS Performed at [facility], [place] [address], [place], [place] [address]    Report Status PENDING  Incomplete  Blood Culture (routine  x 2)     Status: None (Preliminary result)   Collection Time: 04/04/19  8:30 PM   Specimen: BLOOD LEFT ARM  Result Value Ref Range Status   Specimen Description BLOOD LEFT ARM  Final   Special Requests   Final    BOTTLES DRAWN AEROBIC AND ANAEROBIC Blood Culture results may not be optimal due to an inadequate volume of blood received in culture bottles   Culture   Final    NO GROWTH 4 DAYS Performed at [facility], [place] [address], [place], [place] [address]    Report Status PENDING  Incomplete  SARS CORONAVIRUS 2 (TAT 6-24 HRS) Nasopharyngeal Nasopharyngeal Swab     Status: None   Collection Time: 04/04/19 10:27 PM   Specimen: Nasopharyngeal Swab  Result Value Ref Range Status   SARS Coronavirus 2 NEGATIVE NEGATIVE Final    Comment: (NOTE) SARS-CoV-2 target nucleic acids are NOT DETECTED. The SARS-CoV-2 RNA is generally detectable in upper and lower respiratory specimens during the acute phase of infection. Negative results do not preclude SARS-CoV-2 infection, do not rule out co-infections with other pathogens, and should not be used as the sole basis for treatment or other patient management decisions. Negative results must be combined with clinical observations, patient history, and epidemiological information. The expected result is Negative. Fact Sheet for Patients: SugarRoll.be Fact Sheet for Healthcare Providers: https://www.woods-mathews.com/ This test is not yet approved or cleared by the [place] FDA and  has been authorized for detection and/or diagnosis of SARS-CoV-2 by FDA under an Emergency Use Authorization (EUA). This EUA will remain  in effect (meaning this test can be used) for the duration of the COVID-19 declaration under Section 56 4(b)(1) of the Act, 21 U.S.C. section 360bbb-3(b)(1), unless the authorization is terminated or revoked sooner. Performed at [facility], [place] [address],  [place], [place] [address]   Urine culture     Status: Abnormal   Collection Time: 04/05/19  2:36 AM   Specimen: In/Out Cath Urine  Result Value Ref Range Status   Specimen Description IN/OUT CATH URINE  Final   Special Requests   Final    NONE Performed at  [facility], [place] [address] [place], [place] [address]    Culture >=100,000 COLONIES/mL KLEBSIELLA PNEUMONIAE (A)  Final   Report Status 04/08/2019 FINAL  Final   Organism ID, Bacteria KLEBSIELLA PNEUMONIAE (A)  Final      Susceptibility   Klebsiella pneumoniae - MIC*    AMPICILLIN >=32 RESISTANT Resistant     CEFAZOLIN <=4 SENSITIVE Sensitive     CEFTRIAXONE <=1 SENSITIVE Sensitive     CIPROFLOXACIN <=0.25 SENSITIVE Sensitive     GENTAMICIN <=1 SENSITIVE Sensitive     IMIPENEM <=0.25 SENSITIVE Sensitive     NITROFURANTOIN 64 INTERMEDIATE Intermediate     TRIMETH/SULFA <=20 SENSITIVE Sensitive     AMPICILLIN/SULBACTAM 16 INTERMEDIATE Intermediate     PIP/TAZO <=4 SENSITIVE Sensitive     Extended ESBL NEGATIVE Sensitive     * >=100,000 COLONIES/mL KLEBSIELLA PNEUMONIAE      Radiology Studies: Dg Chest Port 1 View  Result Date: 04/07/2019 CLINICAL DATA:  Tachypnea.  Chest pain. EXAM: PORTABLE CHEST 1 VIEW COMPARISON:  One-view chest x-ray 04/04/2019 FINDINGS: Heart is enlarged. Aeration is slightly improved. Mild pulmonary vascular congestion persists. Lung volumes are low. No focal airspace disease is present. Healed right-sided rib fractures are noted. Asymmetric degenerative changes are again noted in the right shoulder. IMPRESSION: 1. Cardiomegaly without failure. 2. Low lung volumes. Electronically Signed   By: San Morelle M.D.   On: 04/07/2019 10:36    Scheduled Meds: . aspirin  324 mg Oral Once  . aspirin EC  81 mg Oral Daily  . atorvastatin  40 mg Oral q1800  . chlordiazePOXIDE  25 mg Oral TID  . ciprofloxacin  500 mg Oral BID  . folic acid  1 mg Oral Daily  . lisinopril  10 mg Oral Daily  . mouth  rinse  15 mL Mouth Rinse BID  . mometasone-formoterol  2 puff Inhalation BID  . multivitamin with minerals  1 tablet Oral Daily  . pantoprazole  40 mg Oral Q0600  . thiamine  100 mg Oral Daily   Or  . thiamine  100 mg Intravenous Daily  . umeclidinium bromide  1 puff Inhalation Daily   Continuous Infusions: . banana bag IV 1000 mL 125 mL/hr at 04/08/19 0400     LOS: 3 days   Time spent: 27 minutes   Darliss Cheney, MD [facility]  04/08/2019, 11:57 AM   To contact the attending provider between 7A-7P or the covering provider during after hours 7P-7A, please log into the web site [email or web address] and use password [email or web address].

## 2019-04-08 NOTE — Progress Notes (Signed)
Pharmacy Antibiotic Note  [name] Pace is a 68 y.o. male admitted on 04/04/2019 with UTI.  Pharmacy has been consulted for Cipro dosing (on day 3).  -urine cultures show klebsiella (sensitive to cipro) -tolerating po medications  Plan: -Change to po Ciprofloxacin 500mg  Q12H. -Will follow renal function, cultures and clinical progress    Height: 6\' 3"  (190.5 cm) Weight: 142 lb 6.7 oz (64.6 kg) IBW/kg (Calculated) : 84.5  Temp (24hrs), Avg:98.6 F (37 C), Min:98.6 F (37 C), Max:98.6 F (37 C)  Recent Labs  Lab 04/03/19 0302 04/04/19 1930 04/04/19 1939 04/05/19 0236 04/05/19 0525 04/07/19 0405 04/08/19 0409  WBC  --   --  12.1*  --  9.3 8.3 10.0  CREATININE 0.58*  --  0.83  --  0.87 0.75 0.74  LATICACIDVEN  --  2.3*  --  1.0  --   --   --     Estimated Creatinine Clearance: 83 mL/min (by C-G formula based on SCr of 0.74 mg/dL).    Allergies  Allergen Reactions  . Penicillins Anaphylaxis    Did it involve swelling of the face/tongue/throat, SOB, or low BP? Yes Did it involve sudden or severe rash/hives, skin peeling, or any reaction on the inside of your mouth or nose? No Did you need to seek medical attention at a hospital or doctor's office? No When did it last happen?childhood If all above answers are "NO", may proceed with cephalosporin use.      Thank you for allowing pharmacy to be a part of this patient's care.  Hildred Laser, PharmD Clinical Pharmacist **Pharmacist phone directory can now be found on [email or web address] (PW [email or web address]).  Listed under [place].

## 2019-04-09 LAB — CULTURE, BLOOD (ROUTINE X 2): Culture: NO GROWTH

## 2019-04-09 LAB — CBC WITH DIFFERENTIAL/PLATELET
Abs Immature Granulocytes: 0.02 10*3/uL (ref 0.00–0.07)
Basophils Absolute: 0.1 10*3/uL (ref 0.0–0.1)
Basophils Relative: 1 %
Eosinophils Absolute: 0.2 10*3/uL (ref 0.0–0.5)
Eosinophils Relative: 2 %
HCT: 38.3 % — ABNORMAL LOW (ref 39.0–52.0)
Hemoglobin: 13.1 g/dL (ref 13.0–17.0)
Immature Granulocytes: 0 %
Lymphocytes Relative: 16 %
Lymphs Abs: 1.2 10*3/uL (ref 0.7–4.0)
MCH: 36.5 pg — ABNORMAL HIGH (ref 26.0–34.0)
MCHC: 34.2 g/dL (ref 30.0–36.0)
MCV: 106.7 fL — ABNORMAL HIGH (ref 80.0–100.0)
Monocytes Absolute: 0.8 10*3/uL (ref 0.1–1.0)
Monocytes Relative: 11 %
Neutro Abs: 5.3 10*3/uL (ref 1.7–7.7)
Neutrophils Relative %: 70 %
Platelets: 210 10*3/uL (ref 150–400)
RBC: 3.59 MIL/uL — ABNORMAL LOW (ref 4.22–5.81)
RDW: 13 % (ref 11.5–15.5)
WBC: 7.5 10*3/uL (ref 4.0–10.5)
nRBC: 0 % (ref 0.0–0.2)

## 2019-04-09 LAB — BASIC METABOLIC PANEL
Anion gap: 10 (ref 5–15)
BUN: <5 mg/dL — ABNORMAL LOW (ref 8–23)
CO2: 23 mmol/L (ref 22–32)
Calcium: 8 mg/dL — ABNORMAL LOW (ref 8.9–10.3)
Chloride: 108 mmol/L (ref 98–111)
Creatinine, Ser: 0.76 mg/dL (ref 0.61–1.24)
GFR calc Af Amer: >60 mL/min (ref >60)
GFR calc non Af Amer: >60 mL/min (ref >60)
Glucose, Bld: 100 mg/dL — ABNORMAL HIGH (ref 70–99)
Potassium: 2.6 mmol/L — CL (ref 3.5–5.1)
Sodium: 141 mmol/L (ref 135–145)

## 2019-04-09 LAB — MAGNESIUM: Magnesium: 1.5 mg/dL — ABNORMAL LOW (ref 1.7–2.4)

## 2019-04-09 MED ORDER — POTASSIUM CHLORIDE CRYS ER 20 MEQ PO TBCR
40.0000 meq | EXTENDED_RELEASE_TABLET | ORAL | Status: AC
  Administered 2019-04-09 (×2): 40 meq via ORAL
  Filled 2019-04-09 (×2): qty 2

## 2019-04-09 MED ORDER — LISINOPRIL 20 MG PO TABS
20.0000 mg | ORAL_TABLET | Freq: Every day | ORAL | Status: DC
  Administered 2019-04-10 – 2019-04-11 (×2): 20 mg via ORAL
  Filled 2019-04-09 (×2): qty 1

## 2019-04-09 MED ORDER — SODIUM CHLORIDE 0.9% FLUSH
10.0000 mL | INTRAVENOUS | Status: DC | PRN
  Administered 2019-04-10 – 2019-04-12 (×2): 10 mL
  Filled 2019-04-09 (×2): qty 40

## 2019-04-09 MED ORDER — MAGNESIUM SULFATE 2 GM/50ML IV SOLN
2.0000 g | Freq: Once | INTRAVENOUS | Status: AC
  Administered 2019-04-09: 2 g via INTRAVENOUS
  Filled 2019-04-09: qty 50

## 2019-04-09 MED ORDER — HALOPERIDOL LACTATE 5 MG/ML IJ SOLN
2.0000 mg | Freq: Four times a day (QID) | INTRAMUSCULAR | Status: DC | PRN
  Administered 2019-04-09 – 2019-04-12 (×4): 2 mg via INTRAVENOUS
  Filled 2019-04-09 (×4): qty 1

## 2019-04-09 MED ORDER — PANTOPRAZOLE SODIUM 40 MG PO TBEC
40.0000 mg | DELAYED_RELEASE_TABLET | ORAL | Status: AC
  Administered 2019-04-09 (×2): 40 mg via ORAL
  Filled 2019-04-09 (×2): qty 1

## 2019-04-09 MED ORDER — NICOTINE 21 MG/24HR TD PT24
21.0000 mg | MEDICATED_PATCH | Freq: Every day | TRANSDERMAL | Status: DC
  Administered 2019-04-09 – 2019-04-11 (×3): 21 mg via TRANSDERMAL
  Filled 2019-04-09 (×4): qty 1

## 2019-04-09 NOTE — Plan of Care (Signed)
  Problem: Elimination: Goal: Will not experience complications related to urinary retention Outcome: Completed/Met  Pt with external Catheter in place-voiding without difficulty.

## 2019-04-09 NOTE — Evaluation (Signed)
Physical Therapy Evaluation Patient Details Name: [name] Pace MRN: [id number] DOB: 12/29/1950 Today's Date: 04/09/2019   History of Present Illness  Pt adm with chest pain and began having etoh withdrawal. PMH - CVA, CAD, CABG, cocaine and etoh abuse, lumbar spinal stenosis, T6-8 compression fx's  Clinical Impression  Pt presents to PT with unsteady gait and needs assist for mobility. Currently appears pt will need SNF. However during prior admits pt has refused this suggestion or left the SNF AMA after 1 day. Expect pt will likely follow a similar course and not be willing to go to SNF. If this happens recommend HHPT as a back up. Unsure of current home support of pt.     Follow Up Recommendations SNF(likely will refuse so likely HHPT)    Equipment Recommendations  None recommended by PT    Recommendations for Other Services       Precautions / Restrictions Precautions Precautions: Fall      Mobility  Bed Mobility Overal bed mobility: Needs Assistance Bed Mobility: Sit to Supine       Sit to supine: Supervision   General bed mobility comments: supervision for lines/tubes  Transfers Overall transfer level: Needs assistance Equipment used: Rolling walker (2 wheeled) Transfers: Sit to/from Stand Sit to Stand: Min assist         General transfer comment: Assist to bring hips up and for balance.  Ambulation/Gait Ambulation/Gait assistance: Min assist;+2 safety/equipment Gait Distance (Feet): 125 Feet Assistive device: Rolling walker (2 wheeled) Gait Pattern/deviations: Step-through pattern;Decreased step length - left;Decreased step length - right;Shuffle;Trunk flexed Gait velocity: decr Gait velocity interpretation: <1.8 ft/sec, indicate of risk for recurrent falls General Gait Details: Assist for balance and support  Stairs            Wheelchair Mobility    Modified Rankin (Stroke Patients Only)       Balance Overall balance assessment: Needs  assistance Sitting-balance support: No upper extremity supported;Feet supported Sitting balance-Leahy Scale: Fair     Standing balance support: Bilateral upper extremity supported Standing balance-Leahy Scale: Poor Standing balance comment: walker and min guard for static standing                             Pertinent Vitals/Pain Pain Assessment: No/denies pain    Home Living Family/patient expects to be discharged to:: Private residence Living Arrangements: Spouse/significant other   Type of Home: Apartment Home Access: Stairs to enter Entrance Stairs-Rails: Right;Left;Can reach both Entrance Stairs-Number of Steps: 2 flights Home Layout: One level Home Equipment: Environmental consultant - 2 wheels;Bedside commode Additional Comments: Information from prior encounter. Unsure if currently accurate.     Prior Function Level of Independence: Independent with assistive device(s)         Comments: Uses rolling walker     Hand Dominance   Dominant Hand: Right    Extremity/Trunk Assessment   Upper Extremity Assessment Upper Extremity Assessment: Generalized weakness    Lower Extremity Assessment Lower Extremity Assessment: Generalized weakness       Communication   Communication: No difficulties  Cognition Arousal/Alertness: Awake/alert Behavior During Therapy: Restless;Impulsive Overall Cognitive Status: No family/caregiver present to determine baseline cognitive functioning                                 General Comments: Pt currently with etoh withdrawal      General Comments  Exercises     Assessment/Plan    PT Assessment Patient needs continued PT services  PT Problem List Decreased strength;Decreased activity tolerance;Decreased balance;Decreased mobility;Decreased cognition;Decreased safety awareness       PT Treatment Interventions DME instruction;Gait training;Functional mobility training;Therapeutic activities;Therapeutic  exercise;Balance training;Cognitive remediation;Patient/family education;Stair training    PT Goals (Current goals can be found in the Care Plan section)  Acute Rehab PT Goals Patient Stated Goal: not stated PT Goal Formulation: Patient unable to participate in goal setting Time For Goal Achievement: 04/23/19 Potential to Achieve Goals: Fair    Frequency Min 3X/week   Barriers to discharge Decreased caregiver support Unknown support at DC    Co-evaluation               AM-PAC PT "6 Clicks" Mobility  Outcome Measure Help needed turning from your back to your side while in a flat bed without using bedrails?: A Little Help needed moving from lying on your back to sitting on the side of a flat bed without using bedrails?: A Little Help needed moving to and from a bed to a chair (including a wheelchair)?: A Little Help needed standing up from a chair using your arms (e.g., wheelchair or bedside chair)?: A Little Help needed to walk in hospital room?: A Little Help needed climbing 3-5 steps with a railing? : A Lot 6 Click Score: 17    End of Session Equipment Utilized During Treatment: Gait belt Activity Tolerance: Patient tolerated treatment well Patient left: in bed;with call bell/phone within reach;with nursing/sitter in room Nurse Communication: Mobility status PT Visit Diagnosis: Unsteadiness on feet (R26.81);Other abnormalities of gait and mobility (R26.89);Muscle weakness (generalized) (M62.81)    Time: [id number] PT Time Calculation (min) (ACUTE ONLY): 17 min   Charges:   PT Evaluation $PT Eval Moderate Complexity: [place] Pager [phone] Office [place] 04/09/2019, 4:08 PM

## 2019-04-09 NOTE — Progress Notes (Signed)
PROGRESS NOTE    [name] Pace  [id number] DOB: [birth year] DOA: 04/04/2019 PCP: Patient, No Pcp Per   Brief Narrative:  [name] Pace is a 68 y.o. male with history of CAD status post CABG, polysubstance abuse, prostate cancer presented to ED with complaint of chest pain for last few days without any shortness of breath or any radiation.  Upon arrival to ER, patient was afebrile chest x-ray does not show anything acute x-ray of the T-spine shows chronic changes. CT of the head was unremarkable.  EKG shows normal sinus rhythm.  High-sensitivity troponins were 20 and 29.  Lactic acid initially was 2.3 improved with 2 1.  UA shows features concerning for UTI.  WBC count was 12.1 COVID-19 was negative.  Given history of CAD patient admitted for chest pain and further work-up. ER provider on exam felt that patient had some abdominal discomfort for which CT abdomen was done which shows urinary bladder thickening.  Cardiology was consulted.  ACS ruled out.  Plan was for nuclear stress test on Saturday however overnight, patient started having full-blown alcohol withdrawal so this was canceled.  Assessment & Plan:   Principal Problem:   Chest pain Active Problems:   Alcohol abuse   CAD (coronary artery disease)   Elevated troponin   Severe alcohol use disorder (HCC)  Chest pain: He is awake today.  Denied any chest pain.  Looks comfortable.  Defer to cardiology for timing and decision of stress test.  Alcohol withdrawal/chronic alcohol dependence: CIWA remains elevated but improving and better than yesterday.  He is requiring fewer doses of Ativan now.  He is alert but confused however he is responsive to questions which he was not yesterday.  At this point in time, I will continue current dose of Librium 25 mg p.o. 3 times daily and as needed Ativan along with CIWA protocol multivitamins.    UTI: Urine culture growing Proteus.  Continue ciprofloxacin.  Hypokalemia: Replaced today.  Recheck in  morning.  Hypomagnesemia: Replenished.  Recheck in the morning.  Hypertension: Blood pressure fairly controlled with some diastolic elevation here and there.  Increase lisinopril to 20 mg and continue as needed hydralazine.  Atelectasis: Not hypoxic anymore.  DVT prophylaxis: Lovenox Code Status: Full code Family Communication: None present at bedside. Disposition Plan: To be determined  Estimated body mass index is 17.8 kg/m as calculated from the following:   Height as of this encounter: 6\' 3"  (1.905 m).   Weight as of this encounter: 64.6 kg.      Nutritional status:               Consultants:   Cardiology  Procedures:   None  Antimicrobials:   Ciprofloxacin   Subjective: Patient seen and examined.  He is alert and responsive to questions but he is confused.  Denied any complaint.  Objective: Vitals:   04/09/19 0853 04/09/19 1020 04/09/19 1158 04/09/19 1207  BP: (!) 157/71 (!) 136/103 (!) 149/86 (!) 151/75  Pulse: 82 75 79 78  Resp:      Temp: 97.8 F (36.6 C)  98 F (36.7 C)   TempSrc: Oral  Oral   SpO2: 98%  100%   Weight:      Height:        Intake/Output Summary (Last 24 hours) at 04/09/2019 1212 Last data filed at 04/09/2019 0855 Gross per 24 hour  Intake 0 ml  Output 1400 ml  Net -1400 ml   Filed Weights   04/05/19  0600 04/07/19 0638 04/08/19 0327  Weight: 63.5 kg 62.4 kg 64.6 kg    Examination:  General exam: Appears calm and comfortable but confused Respiratory system: Diminished breath sounds at the bases, mostly on the left base. Respiratory effort normal. Cardiovascular system: S1 & S2 heard, RRR. No JVD, murmurs, rubs, gallops or clicks. No pedal edema. Gastrointestinal system: Abdomen is nondistended, soft and nontender. No organomegaly or masses felt. Normal bowel sounds heard. Central nervous system: Alert and oriented. No focal neurological deficits. Extremities: Symmetric 5 x 5 power. Skin: No rashes, lesions or  ulcers.  Psychiatry: Judgement and insight appear poor. Mood & affect appropriate.   Data Reviewed: I have personally reviewed following labs and imaging studies  CBC: Recent Labs  Lab 04/04/19 1939 04/05/19 0525 04/07/19 0405 04/08/19 0409 04/09/19 0440  WBC 12.1* 9.3 8.3 10.0 7.5  NEUTROABS  --   --  6.3 7.3 5.3  HGB 16.7 14.0 15.6 15.6 13.1  HCT 47.7 41.8 43.9 43.5 38.3*  MCV 105.5* 110.6* 104.5* 103.3* 106.7*  PLT 204 208 101* 191 [id number]   Basic Metabolic Panel: Recent Labs  Lab 04/04/19 1939 04/05/19 0525 04/07/19 0405 04/08/19 0409 04/08/19 0939 04/09/19 0440  NA 137 137 138 140  --  141  K 4.1 4.0 4.1 3.7  --  2.6*  CL 99 102 107 111  --  108  CO2 20* 18* 18* 17*  --  23  GLUCOSE 124* 72 118* 80  --  100*  BUN 5* 7* 9 <5*  --  <5*  CREATININE 0.83 0.87 0.75 0.74  --  0.76  CALCIUM 9.2 8.2* 8.5* 8.0*  --  8.0*  MG  --  1.7 1.6*  --  1.5* 1.5*  PHOS  --  4.0  --   --   --   --    GFR: Estimated Creatinine Clearance: 83 mL/min (by C-G formula based on SCr of 0.76 mg/dL). Liver Function Tests: Recent Labs  Lab 04/03/19 0302 04/04/19 1939 04/05/19 0525  AST 54* 49* 34  ALT 28 31 23   ALKPHOS 78 104 81  BILITOT 0.8 2.2* 1.5*  PROT 6.5 7.4 6.1*  ALBUMIN 3.5 4.1 3.0*   Recent Labs  Lab 04/04/19 1939  LIPASE 24   Recent Labs  Lab 04/04/19 1939  AMMONIA 24   Coagulation Profile: Recent Labs  Lab 04/04/19 1939 04/05/19 0236  INR 0.9 1.0   Cardiac Enzymes: No results for input(s): CKTOTAL, CKMB, CKMBINDEX, TROPONINI in the last 168 hours. BNP (last 3 results) No results for input(s): PROBNP in the last 8760 hours. HbA1C: No results for input(s): HGBA1C in the last 72 hours. CBG: No results for input(s): GLUCAP in the last 168 hours. Lipid Profile: No results for input(s): CHOL, HDL, LDLCALC, TRIG, CHOLHDL, LDLDIRECT in the last 72 hours. Thyroid Function Tests: No results for input(s): TSH, T4TOTAL, FREET4, T3FREE, THYROIDAB in the last 72  hours. Anemia Panel: No results for input(s): VITAMINB12, FOLATE, FERRITIN, TIBC, IRON, RETICCTPCT in the last 72 hours. Sepsis Labs: Recent Labs  Lab 04/04/19 1930 04/05/19 0236  LATICACIDVEN 2.3* 1.0    Recent Results (from the past 240 hour(s))  SARS CORONAVIRUS 2 (TAT 6-24 HRS) Nasopharyngeal Nasopharyngeal Swab     Status: None   Collection Time: 04/03/19  6:35 AM   Specimen: Nasopharyngeal Swab  Result Value Ref Range Status   SARS Coronavirus 2 NEGATIVE NEGATIVE Final    Comment: (NOTE) SARS-CoV-2 target nucleic acids are NOT DETECTED. The SARS-CoV-2  RNA is generally detectable in upper and lower respiratory specimens during the acute phase of infection. Negative results do not preclude SARS-CoV-2 infection, do not rule out co-infections with other pathogens, and should not be used as the sole basis for treatment or other patient management decisions. Negative results must be combined with clinical observations, patient history, and epidemiological information. The expected result is Negative. Fact Sheet for Patients: SugarRoll.be Fact Sheet for Healthcare Providers: https://www.woods-mathews.com/ This test is not yet approved or cleared by the [place] FDA and  has been authorized for detection and/or diagnosis of SARS-CoV-2 by FDA under an Emergency Use Authorization (EUA). This EUA will remain  in effect (meaning this test can be used) for the duration of the COVID-19 declaration under Section 56 4(b)(1) of the Act, 21 U.S.C. section 360bbb-3(b)(1), unless the authorization is terminated or revoked sooner. Performed at [facility], [place] [address], [place], [place] [address]   Blood Culture (routine x 2)     Status: None (Preliminary result)   Collection Time: 04/04/19  7:00 PM   Specimen: BLOOD  Result Value Ref Range Status   Specimen Description BLOOD BLOOD LEFT HAND  Final   Special Requests AEROBIC BOTTLE  ONLY Blood Culture adequate volume  Final   Culture   Final    NO GROWTH 4 DAYS Performed at [facility], [place] [address] [place], [place] [address]    Report Status PENDING  Incomplete  Blood Culture (routine x 2)     Status: None   Collection Time: 04/04/19  8:30 PM   Specimen: BLOOD LEFT ARM  Result Value Ref Range Status   Specimen Description BLOOD LEFT ARM  Final   Special Requests   Final    BOTTLES DRAWN AEROBIC AND ANAEROBIC Blood Culture results may not be optimal due to an inadequate volume of blood received in culture bottles   Culture   Final    NO GROWTH 5 DAYS Performed at [facility], [place] [address], [place], [place] [address]    Report Status 04/09/2019 FINAL  Final  SARS CORONAVIRUS 2 (TAT 6-24 HRS) Nasopharyngeal Nasopharyngeal Swab     Status: None   Collection Time: 04/04/19 10:27 PM   Specimen: Nasopharyngeal Swab  Result Value Ref Range Status   SARS Coronavirus 2 NEGATIVE NEGATIVE Final    Comment: (NOTE) SARS-CoV-2 target nucleic acids are NOT DETECTED. The SARS-CoV-2 RNA is generally detectable in upper and lower respiratory specimens during the acute phase of infection. Negative results do not preclude SARS-CoV-2 infection, do not rule out co-infections with other pathogens, and should not be used as the sole basis for treatment or other patient management decisions. Negative results must be combined with clinical observations, patient history, and epidemiological information. The expected result is Negative. Fact Sheet for Patients: SugarRoll.be Fact Sheet for Healthcare Providers: https://www.woods-mathews.com/ This test is not yet approved or cleared by the [place] FDA and  has been authorized for detection and/or diagnosis of SARS-CoV-2 by FDA under an Emergency Use Authorization (EUA). This EUA will remain  in effect (meaning this test can be used) for the duration of the COVID-19  declaration under Section 56 4(b)(1) of the Act, 21 U.S.C. section 360bbb-3(b)(1), unless the authorization is terminated or revoked sooner. Performed at [facility], [place] [address] [place], [place] [address]   Urine culture     Status: Abnormal   Collection Time: 04/05/19  2:36 AM   Specimen: In/Out Cath  Urine  Result Value Ref Range Status   Specimen Description IN/OUT CATH URINE  Final   Special Requests   Final    NONE Performed at [facility], [address], [place], [place] [address]    Culture >=100,000 COLONIES/mL KLEBSIELLA PNEUMONIAE (A)  Final   Report Status 04/08/2019 FINAL  Final   Organism ID, Bacteria KLEBSIELLA PNEUMONIAE (A)  Final      Susceptibility   Klebsiella pneumoniae - MIC*    AMPICILLIN >=32 RESISTANT Resistant     CEFAZOLIN <=4 SENSITIVE Sensitive     CEFTRIAXONE <=1 SENSITIVE Sensitive     CIPROFLOXACIN <=0.25 SENSITIVE Sensitive     GENTAMICIN <=1 SENSITIVE Sensitive     IMIPENEM <=0.25 SENSITIVE Sensitive     NITROFURANTOIN 64 INTERMEDIATE Intermediate     TRIMETH/SULFA <=20 SENSITIVE Sensitive     AMPICILLIN/SULBACTAM 16 INTERMEDIATE Intermediate     PIP/TAZO <=4 SENSITIVE Sensitive     Extended ESBL NEGATIVE Sensitive     * >=100,000 COLONIES/mL KLEBSIELLA PNEUMONIAE      Radiology Studies: No results found.  Scheduled Meds: . aspirin  324 mg Oral Once  . aspirin EC  81 mg Oral Daily  . atorvastatin  40 mg Oral q1800  . chlordiazePOXIDE  25 mg Oral TID  . ciprofloxacin  500 mg Oral BID  . folic acid  1 mg Oral Daily  . lisinopril  10 mg Oral Daily  . mouth rinse  15 mL Mouth Rinse BID  . mometasone-formoterol  2 puff Inhalation BID  . multivitamin with minerals  1 tablet Oral Daily  . nicotine  21 mg Transdermal Daily  . pantoprazole  40 mg Oral Q4H  . potassium chloride  40 mEq Oral Q4H  . thiamine  100 mg Oral Daily   Or  . thiamine  100 mg Intravenous Daily  . umeclidinium bromide  1 puff Inhalation Daily    Continuous Infusions: . magnesium sulfate bolus IVPB    . banana bag IV 1000 mL 125 mL/hr at 04/09/19 0116     LOS: 4 days   Time spent: 26 minutes   Darliss Cheney, MD [facility]  04/09/2019, 12:12 PM   To contact the attending provider between 7A-7P or the covering provider during after hours 7P-7A, please log into the web site [email or web address] and use password [email or web address].

## 2019-04-09 NOTE — Care Management Important Message (Signed)
Important Message  Patient Details  Name: [name] Pace MRN: [id number] Date of Birth: 1951-03-21   Medicare Important Message Given:  Yes     [name] 04/09/2019, 3:15 PM

## 2019-04-09 NOTE — Progress Notes (Signed)
Provider on call notified am Potassium 2.6 and Mg 1.4.  Awaiting orders. Jessie Foot, RN

## 2019-04-10 LAB — CBC WITH DIFFERENTIAL/PLATELET
Abs Immature Granulocytes: 0.1 10*3/uL — ABNORMAL HIGH (ref 0.00–0.07)
Basophils Absolute: 0.1 10*3/uL (ref 0.0–0.1)
Basophils Relative: 1 %
Eosinophils Absolute: 0.3 10*3/uL (ref 0.0–0.5)
Eosinophils Relative: 4 %
HCT: 35.6 % — ABNORMAL LOW (ref 39.0–52.0)
Hemoglobin: 12.6 g/dL — ABNORMAL LOW (ref 13.0–17.0)
Immature Granulocytes: 1 %
Lymphocytes Relative: 20 %
Lymphs Abs: 1.4 10*3/uL (ref 0.7–4.0)
MCH: 36.6 pg — ABNORMAL HIGH (ref 26.0–34.0)
MCHC: 35.4 g/dL (ref 30.0–36.0)
MCV: 103.5 fL — ABNORMAL HIGH (ref 80.0–100.0)
Monocytes Absolute: 0.9 10*3/uL (ref 0.1–1.0)
Monocytes Relative: 13 %
Neutro Abs: 4.5 10*3/uL (ref 1.7–7.7)
Neutrophils Relative %: 61 %
Platelets: 188 10*3/uL (ref 150–400)
RBC: 3.44 MIL/uL — ABNORMAL LOW (ref 4.22–5.81)
RDW: 13 % (ref 11.5–15.5)
WBC: 7.3 10*3/uL (ref 4.0–10.5)
nRBC: 0 % (ref 0.0–0.2)

## 2019-04-10 LAB — CULTURE, BLOOD (ROUTINE X 2)
Culture: NO GROWTH
Special Requests: ADEQUATE

## 2019-04-10 MED ORDER — ALTEPLASE 2 MG IJ SOLR: 2.0000 mg | Freq: Once | INTRAMUSCULAR | Status: DC

## 2019-04-10 NOTE — Progress Notes (Signed)
Physical Therapy Treatment Patient Details Name: [name] Pace MRN: [id number] DOB: 10-Jul-1950 Today's Date: 04/10/2019    History of Present Illness Pt adm with chest pain and began having etoh withdrawal. PMH - CVA, CAD, CABG, cocaine and etoh abuse, lumbar spinal stenosis, T6-8 compression fx's    PT Comments    Slow progress with mobility. Continue to recommend ST-SNF but expect pt will refuse this.    Follow Up Recommendations  SNF(likely will refuse so likely HHPT)     Equipment Recommendations  None recommended by PT    Recommendations for Other Services       Precautions / Restrictions Precautions Precautions: Fall    Mobility  Bed Mobility Overal bed mobility: Needs Assistance Bed Mobility: Sit to Supine;Supine to Sit     Supine to sit: Supervision Sit to supine: Supervision   General bed mobility comments: supervision for lines/tubes  Transfers Overall transfer level: Needs assistance Equipment used: Rolling walker (2 wheeled) Transfers: Sit to/from Stand Sit to Stand: Min assist         General transfer comment: Assist to bring hips up and for balance.  Ambulation/Gait Ambulation/Gait assistance: Min assist Gait Distance (Feet): 150 Feet Assistive device: Rolling walker (2 wheeled) Gait Pattern/deviations: Step-through pattern;Decreased step length - left;Decreased step length - right;Shuffle;Trunk flexed Gait velocity: decr Gait velocity interpretation: <1.8 ft/sec, indicate of risk for recurrent falls General Gait Details: Assist for balance and support. Verbal cues to stay closer to walker   Stairs             Wheelchair Mobility    Modified Rankin (Stroke Patients Only)       Balance Overall balance assessment: Needs assistance Sitting-balance support: No upper extremity supported;Feet supported Sitting balance-Leahy Scale: Fair     Standing balance support: Bilateral upper extremity supported Standing balance-Leahy Scale:  Poor Standing balance comment: walker and min guard for static standing                            Cognition Arousal/Alertness: Awake/alert Behavior During Therapy: Flat affect Overall Cognitive Status: No family/caregiver present to determine baseline cognitive functioning                                 General Comments: Pt currently with etoh withdrawal      Exercises      General Comments        Pertinent Vitals/Pain Pain Assessment: Faces Faces Pain Scale: No hurt    Home Living                      Prior Function            PT Goals (current goals can now be found in the care plan section) Acute Rehab PT Goals Patient Stated Goal: not stated Progress towards PT goals: Progressing toward goals    Frequency    Min 3X/week      PT Plan Current plan remains appropriate    Co-evaluation              AM-PAC PT "6 Clicks" Mobility   Outcome Measure  Help needed turning from your back to your side while in a flat bed without using bedrails?: A Little Help needed moving from lying on your back to sitting on the side of a flat bed without using bedrails?: A Little Help needed  moving to and from a bed to a chair (including a wheelchair)?: A Little Help needed standing up from a chair using your arms (e.g., wheelchair or bedside chair)?: A Little Help needed to walk in hospital room?: A Little Help needed climbing 3-5 steps with a railing? : A Lot 6 Click Score: 17    End of Session Equipment Utilized During Treatment: Gait belt Activity Tolerance: Patient tolerated treatment well Patient left: in bed;with call bell/phone within reach;with nursing/sitter in room Nurse Communication: Mobility status PT Visit Diagnosis: Unsteadiness on feet (R26.81);Other abnormalities of gait and mobility (R26.89);Muscle weakness (generalized) (M62.81)     Time: 1335-1350 PT Time Calculation (min) (ACUTE ONLY): 15 min  Charges:   $Gait Training: 8-22 mins                     [place] Pager [phone] Office [place] 04/10/2019, 2:14 PM

## 2019-04-10 NOTE — Evaluation (Signed)
Clinical/Bedside Swallow Evaluation Patient Details  Name: [name] Pace MRN: [id number] Date of Birth: 04-Nov-1950  Today's Date: 04/10/2019 Time: SLP Start Time (ACUTE ONLY): [id number] SLP Stop Time (ACUTE ONLY): 0939 SLP Time Calculation (min) (ACUTE ONLY): 16 min  Past Medical History:  Past Medical History:  Diagnosis Date  . Alcohol abuse   . Anxiety   . Breast cancer ([place]) Left breast   Per patient diagnosed in early 66's.   . Cocaine abuse ([place]) 08/19/2018  . Coronary artery disease   . High cholesterol   . Left rib fracture 08/19/2018  . Prostate cancer ([place])    per patient diagnosed in early 61's  . Stroke [facility])    Past Surgical History:  Past Surgical History:  Procedure Laterality Date  . CAROTID STENT    . CORONARY ARTERY BYPASS GRAFT    . FEMUR IM NAIL Left 07/02/2014   Procedure: INTRAMEDULLARY (IM) NAIL FEMORAL;  Surgeon: Elie Goody, MD;  Location: [facility];  Service: Orthopedics;  Laterality: Left;  . I&D EXTREMITY Right 02/10/2016   Procedure: IRRIGATION AND DEBRIDEMENT EXTREMITY;  Surgeon: Dorna Leitz, MD;  Location: [place];  Service: Orthopedics;  Laterality: Right;  . TENDON REPAIR Right 02/10/2016   Procedure: Anterior TENDON REPAIR and wound exploration.;  Surgeon: Dorna Leitz, MD;  Location: [place];  Service: Orthopedics;  Laterality: Right;   HPI:  [name] Pace is a 68 y.o. male with history of CAD status post CABG, polysubstance abuse, prostate cancer presented to ED with complaint of chest pain for last few days without any shortness of breath or any radiation.  Upon arrival to ER, patient was afebrile chest x-ray does not show anything acute x-ray of the T-spine shows chronic changes.  Pt has been undergoing ETOH withdrawal. CT of the head was unremarkable. CXR 11/1 showed no acute disease.  Pt known to this service from prior admission Dec 2019-Jan 2020 with dysphagia post extubation which resolved.   Assessment / Plan / Recommendation Clinical  Impression  Pt presents with functional swallowing as assessed clinically.  There were multiple swallows and immediate wet cough with initial trial of thin liquid only.  Further trials of thin liquid, including by straw, were tolerated without any clinical s/s of aspiration.  Pt tolerated puree and regular solid textures with no clinical s/s of aspiration and exhibited good oral clearance.  Pt was noted to take large bite of regular solid.  Pt reports no difficulty chewing at baseline despite dental status.     Recommend continuing mechanical soft diet with thin liquid. SLP to follow for diet tolerance, and possible upgrade to regular solids.    OF NOTE: On SLP arrival, oral vaity was assessed. Xerostomia noted with black hairy appearance to tongue.  SLP provided oral care prior to PO trials.  Recommend continuing thorough aggressive oral care.  SLP Visit Diagnosis: Dysphagia, unspecified (R13.10)    Aspiration Risk  Mild aspiration risk    Diet Recommendation Dysphagia 3 (Mech soft);Thin liquid   Liquid Administration via: Cup;Straw Medication Administration: Whole meds with liquid Supervision: Patient able to self feed;Intermittent supervision to cue for compensatory strategies Compensations: Slow rate;Small sips/bites Postural Changes: Seated upright at 90 degrees    Other  Recommendations Oral Care Recommendations: Oral care QID;Oral care before and after PO   Follow up Recommendations (TBD)      Frequency and Duration min 2x/week  2 weeks       Prognosis Prognosis for Safe Diet Advancement: Good  Swallow Study   General Date of Onset: 04/02/19 HPI: [name] Pace is a 68 y.o. male with history of CAD status post CABG, polysubstance abuse, prostate cancer presented to ED with complaint of chest pain for last few days without any shortness of breath or any radiation.  Upon arrival to ER, patient was afebrile chest x-ray does not show anything acute x-ray of the T-spine  shows chronic changes. CT of the head was unremarkable. CXR 11/1 showed no acute disease.  Pt known to this service from prior admission Dec 2019-Jan 2020 with dysphagia post extubation which resolved Type of Study: Bedside Swallow Evaluation Previous Swallow Assessment: during prior admission Diet Prior to this Study: Dysphagia 3 (soft);Thin liquids Temperature Spikes Noted: No Respiratory Status: Room air History of Recent Intubation: No Behavior/Cognition: Alert;Cooperative;Confused Oral Cavity Assessment: Dry(Black tongue) Oral Care Completed by SLP: Yes Oral Cavity - Dentition: Poor condition;Missing dentition;Edentulous Vision: Functional for self-feeding Self-Feeding Abilities: Able to feed self Patient Positioning: Upright in bed Baseline Vocal Quality: Normal    Oral/Motor/Sensory Function Overall Oral Motor/Sensory Function: Mild impairment Facial ROM: Reduced right;Reduced left Facial Symmetry: Within Functional Limits Lingual ROM: Reduced right;Reduced left Lingual Symmetry: Within Functional Limits Lingual Strength: Reduced Mandible: Within Functional Limits   Ice Chips Ice chips: Not tested   Thin Liquid Thin Liquid: Impaired Presentation: Cup;Straw Pharyngeal  Phase Impairments: Cough - Immediate Other Comments: initial trial only    Nectar Thick Nectar Thick Liquid: Not tested   Honey Thick Honey Thick Liquid: Not tested   Puree Puree: Within functional limits Presentation: Spoon   Solid     Solid: Within functional limits Presentation: [place], [place], [place] Office: [phone] 04/10/2019,10:17 AM

## 2019-04-10 NOTE — Progress Notes (Signed)
Patient spoke with family and asked them to come pick him up because he was being discharged, nurse tech informed patient that he was not being discharged. Patient became agitated and began taking off mitten and attempting to get out of bed, nurse tech and RN tried to encourage patient to relax and stay in bed. Patient became more agitated and started pulling at cords and lines, patient yelling at tech and RN that he was leaving and "going to [facility] to get his medicine". RN administered 2mg  Haldol. Patient relaxed in bed, no longer pulling at cords. Will continue to monitor.   Family contacted nursing station and requested that patient no longer have access to phone. Charge nurse notified RN, will relay to sitter, nurse tech and oncoming shift.

## 2019-04-10 NOTE — Progress Notes (Signed)
Progress Note  Patient Name: [name] Pace Date of Encounter: 04/10/2019  Primary Cardiologist: [name] Him, MD   Subjective   Right sided chest pain has been ongoing for past 2 month after he fell and had rib fracture. Denies any CP prior to fall. No SOB. Want something for rib pain.  Inpatient Medications    Scheduled Meds: . alteplase  2 mg Intracatheter Once  . aspirin  324 mg Oral Once  . aspirin EC  81 mg Oral Daily  . atorvastatin  40 mg Oral q1800  . chlordiazePOXIDE  25 mg Oral TID  . ciprofloxacin  500 mg Oral BID  . folic acid  1 mg Oral Daily  . lisinopril  20 mg Oral Daily  . mouth rinse  15 mL Mouth Rinse BID  . mometasone-formoterol  2 puff Inhalation BID  . multivitamin with minerals  1 tablet Oral Daily  . nicotine  21 mg Transdermal Daily  . thiamine  100 mg Oral Daily   Or  . thiamine  100 mg Intravenous Daily  . umeclidinium bromide  1 puff Inhalation Daily   Continuous Infusions: . banana bag IV 1000 mL 125 mL/hr at 04/09/19 1900   PRN Meds: acetaminophen, albuterol, haloperidol lactate, hydrALAZINE, LORazepam **OR** LORazepam, ondansetron (ZOFRAN) IV, sodium chloride flush, spiritus frumenti   Vital Signs    Vitals:   04/09/19 2307 04/10/19 0439 04/10/19 0805 04/10/19 0836  BP:   (!) 150/81   Pulse:   67   Resp:   (!) 21   Temp: 98.2 F (36.8 C) 98.2 F (36.8 C) 98.1 F (36.7 C)   TempSrc: Axillary Axillary Axillary   SpO2:   98% 97%  Weight:      Height:        Intake/Output Summary (Last 24 hours) at 04/10/2019 0958 Last data filed at 04/10/2019 0600 Gross per 24 hour  Intake 420 ml  Output 725 ml  Net -305 ml   Last 3 Weights 04/08/2019 04/07/2019 04/05/2019  Weight (lbs) 142 lb 6.7 oz 137 lb 9.1 oz 139 lb 15.9 oz  Weight (kg) 64.6 kg 62.4 kg 63.5 kg      Telemetry    NSR without significant ventricular ectopy - Personally Reviewed  ECG    NSR with ST down slopping in inferolateral leads - Personally Reviewed  Physical  Exam   GEN: No acute distress.   Neck: No JVD Cardiac: RRR, no murmurs, rubs, or gallops.  Respiratory: Clear to auscultation bilaterally. GI: Soft, nontender, non-distended  MS: No edema; No deformity. Neuro:  Nonfocal  Psych: Normal affect   Labs    High Sensitivity Troponin:   Recent Labs  Lab 04/03/19 0302 04/03/19 0650 04/04/19 1939 04/05/19 0236  TROPONINIHS 11 8 20* 29*      Chemistry Recent Labs  Lab 04/04/19 1939 04/05/19 0525 04/07/19 0405 04/08/19 0409 04/09/19 0440  NA 137 137 138 140 141  K 4.1 4.0 4.1 3.7 2.6*  CL 99 102 107 111 108  CO2 20* 18* 18* 17* 23  GLUCOSE 124* 72 118* 80 100*  BUN 5* 7* 9 <5* <5*  CREATININE 0.83 0.87 0.75 0.74 0.76  CALCIUM 9.2 8.2* 8.5* 8.0* 8.0*  PROT 7.4 6.1*  --   --   --   ALBUMIN 4.1 3.0*  --   --   --   AST 49* 34  --   --   --   ALT 31 23  --   --   --  ALKPHOS 104 81  --   --   --   BILITOT 2.2* 1.5*  --   --   --   GFRNONAA >60 >60 >60 >60 >60  GFRAA >60 >60 >60 >60 >60  ANIONGAP 18* 17* 13 12 10      Hematology Recent Labs  Lab 04/08/19 0409 04/09/19 0440 04/10/19 0754  WBC 10.0 7.5 7.3  RBC 4.21* 3.59* 3.44*  HGB 15.6 13.1 12.6*  HCT 43.5 38.3* 35.6*  MCV 103.3* 106.7* 103.5*  MCH 37.1* 36.5* 36.6*  MCHC 35.9 34.2 35.4  RDW 12.8 13.0 13.0  PLT 191 210 188    BNPNo results for input(s): BNP, PROBNP in the last 168 hours.   DDimer No results for input(s): DDIMER in the last 168 hours.   Radiology    No results found.  Cardiac Studies   Echo 12/02/2018  1. The left ventricle has a visually estimated ejection fraction of 55%. The cavity size was mildly dilated. Left ventricular diastolic Doppler parameters are consistent with pseudonormalization. Basal inferior hypokinesis.  2. Left atrial size was moderately dilated.  3. No evidence of mitral valve stenosis. Trivial mitral regurgitation.  4. The aortic valve is tricuspid. Mild calcification of the aortic valve. No stenosis of the aortic  valve.  5. There is mild dilatation of the aortic root measuring 41 mm.  6. Normal IVC size. No complete TR doppler jet so unable to estimate PA systolic pressure.  7. The right ventricle has normal systolic function. The cavity was normal. There is no increase in right ventricular wall thickness.  Patient Profile     69 y.o. male with PMH of CAD s/p CABG, chronic EtOH abuse, COPD, tobacco abuse who presented to the hospital on 10/30 with chest pain. Initial plan was to proceed with [facility] however he became combative due to EtOH withdraw and encephalopathy. He was also treated for UTI.   Assessment & Plan    1. Chest pain  - chest pain seems more like musculoskeletal in nature. Located in the right chest, worse with palpation and deep inspiration. Had a fall and rib fracture there 2 month ago.   - original plan is to proceed with myoview. Trop mildly elevated 20 -->29  - EKG shows ST down slopping in inferolateral leads that is similar to previous EKG in June, but lateral ST down slopping is more pronounced when compare to previous EKG in March 2020.   - MD to review and see if [name] Pace is still indicated. Unless area of ischemia is large, would not recommend invasive workup esp in light on ongoing alcoholism which may hinder compliance with medications.   2. CAD s/p CABG  3. EtOH withdraw: alert and oriented at this point  4. Tobacco abuse      For questions or updates, please contact [name] Pace Please consult [email or web address] for contact info under        Signed, [name] Pace, [place]  04/10/2019, 9:58 AM
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
## 2019-04-10 NOTE — Progress Notes (Signed)
PROGRESS NOTE    [name] Pace  [id number] DOB: July 19, 1950 DOA: 04/04/2019 PCP: Patient, No Pcp Per   Brief Narrative:  [name] Pace is a 68 y.o. male with history of CAD status post CABG, polysubstance abuse, prostate cancer presented to ED with complaint of chest pain for last few days without any shortness of breath or any radiation.  Upon arrival to ER, patient was afebrile chest x-ray does not show anything acute x-ray of the T-spine shows chronic changes. CT of the head was unremarkable.  EKG shows normal sinus rhythm.  High-sensitivity troponins were 20 and 29.  Lactic acid initially was 2.3 improved with 2 1.  UA shows features concerning for UTI.  WBC count was 12.1 COVID-19 was negative.  Given history of CAD patient admitted for chest pain and further work-up. ER provider on exam felt that patient had some abdominal discomfort for which CT abdomen was done which shows urinary bladder thickening.  Cardiology was consulted.  ACS ruled out.  Plan was for nuclear stress test on Saturday however overnight, patient started having full-blown alcohol withdrawal so this was canceled. -------------------------------------------------------------------------------------------------------------------------------------------------   Subjective : The patient was seen and examined this morning, remained stable denies any chest pain at this time. Complaining of some diffuse nonspecific abdominal pain, Mildly confused and agitated, asking for pain medication  Nursing staff present at bedside reporting no other issues overnight.    Assessment & Plan:   Principal Problem:   Chest pain Active Problems:   Alcohol abuse   CAD (coronary artery disease)   Elevated troponin   Severe alcohol use disorder (HCC)  Chest pain -in a history of coronary artery disease and h/o CABG -Remained stable currently denies any chest pain or shortness of breath. -Pending cardiology evaluation for further  work-up including cardiac stress test -At this time cardiology not recommending any further invasive procedures -We will continue optimizing medical management -Echo reviewed  EtOH abuse/dependence/withdrawal -Continue treatment with CIWA protocol, thiamine, folate, -Improving mentation--still mildly confused, agitated -Patient was noted to be on Librium will continue for another day 25 mg p.o. 3 times daily  Urine tract infection -Urine culture positive for Proteus, pansensitive -Continue current antibiotics of ciprofloxacin   Hypokalemia: Monitoring and repleting  Hypomagnesemia: Monitoring and repleting  Hypertension: -Blood pressure remained stable -Continue current medication lisinopril -As needed hydralazine   Atelectasis: Not hypoxic anymore.  DVT prophylaxis: Lovenox Code Status: Full code Family Communication: None present at bedside. Disposition Plan: To be determined  Estimated body mass index is 17.8 kg/m as calculated from the following:   Height as of this encounter: 6\' 3"  (1.905 m).   Weight as of this encounter: 64.6 kg.    Consultants:   Cardiology  Procedures:   None  Antimicrobials:   Ciprofloxacin   2D echocardiogram: Echo 12/02/2018 1. The left ventricle has a visually estimated ejection fraction of 55%. The cavity size was mildly dilated. Left ventricular diastolic Doppler parameters are consistent with pseudonormalization. Basal inferior hypokinesis. 2. Left atrial size was moderately dilated. 3. No evidence of mitral valve stenosis. Trivial mitral regurgitation. 4. The aortic valve is tricuspid. Mild calcification of the aortic valve. No stenosis of the aortic valve. 5. There is mild dilatation of the aortic root measuring 41 mm. 6. Normal IVC size. No complete TR doppler jet so unable to estimate PA systolic pressure. 7. The right ventricle has normal systolic function. The cavity was normal. There is no increase in right  ventricular wall thickness.  Objective: Vitals:   04/10/19 0439 04/10/19 0805 04/10/19 0836 04/10/19 1205  BP:  (!) 150/81  138/84  Pulse:  67  82  Resp:  (!) 21  (!) 22  Temp: 98.2 F (36.8 C) 98.1 F (36.7 C)  98.7 F (37.1 C)  TempSrc: Axillary Axillary  Axillary  SpO2:  98% 97%   Weight:      Height:        Intake/Output Summary (Last 24 hours) at 04/10/2019 1255 Last data filed at 04/10/2019 0600 Gross per 24 hour  Intake 60 ml  Output 500 ml  Net -440 ml   Filed Weights   04/05/19 0600 04/07/19 0638 04/08/19 0327  Weight: 63.5 kg 62.4 kg 64.6 kg    Examination: BP 138/84 (BP Location: Left Arm)   Pulse 82   Temp 98.7 F (37.1 C) (Axillary)   Resp (!) 22   Ht 6\' 3"  (1.905 m)   Wt 64.6 kg   SpO2 97%   BMI 17.80 kg/m    Physical Exam  Constitution:  Alert, cooperative, no distress,  Psychiatric:  stable mood and affect,  HEENT: Normocephalic, PERRL, otherwise with in Normal limits  Chest:Chest symmetric Cardio vascular:  S1/S2, RRR, No murmure, No Rubs or Gallops  pulmonary: Clear to auscultation bilaterally, respirations unlabored, negative wheezes / crackles Abdomen: Soft, non-tender, non-distended, bowel sounds,no masses, no organomegaly Muscular skeletal: Limited exam - in bed, able to move all 4 extremities, Normal strength,  Neuro: CNII-XII intact. , normal motor and sensation, reflexes intact  Extremities: No pitting edema lower extremities, +2 pulses  Skin: Dry, warm to touch, negative for any Rashes, No open wounds Wounds: per nursing documentation     Data Reviewed: I have personally reviewed following labs and imaging studies  CBC: Recent Labs  Lab 04/05/19 0525 04/07/19 0405 04/08/19 0409 04/09/19 0440 04/10/19 0754  WBC 9.3 8.3 10.0 7.5 7.3  NEUTROABS  --  6.3 7.3 5.3 4.5  HGB 14.0 15.6 15.6 13.1 12.6*  HCT 41.8 43.9 43.5 38.3* 35.6*  MCV 110.6* 104.5* 103.3* 106.7* 103.5*  PLT 208 101* 191 210 0000000   Basic Metabolic Panel:  Recent Labs  Lab 04/04/19 1939 04/05/19 0525 04/07/19 0405 04/08/19 0409 04/08/19 0939 04/09/19 0440  NA 137 137 138 140  --  141  K 4.1 4.0 4.1 3.7  --  2.6*  CL 99 102 107 111  --  108  CO2 20* 18* 18* 17*  --  23  GLUCOSE 124* 72 118* 80  --  100*  BUN 5* 7* 9 <5*  --  <5*  CREATININE 0.83 0.87 0.75 0.74  --  0.76  CALCIUM 9.2 8.2* 8.5* 8.0*  --  8.0*  MG  --  1.7 1.6*  --  1.5* 1.5*  PHOS  --  4.0  --   --   --   --    GFR: Estimated Creatinine Clearance: 83 mL/min (by C-G formula based on SCr of 0.76 mg/dL). Liver Function Tests: Recent Labs  Lab 04/04/19 1939 04/05/19 0525  AST 49* 34  ALT 31 23  ALKPHOS 104 81  BILITOT 2.2* 1.5*  PROT 7.4 6.1*  ALBUMIN 4.1 3.0*   Recent Labs  Lab 04/04/19 1939  LIPASE 24   Recent Labs  Lab 04/04/19 1939  AMMONIA 24   Coagulation Profile: Recent Labs  Lab 04/04/19 1939 04/05/19 0236  INR 0.9 1.0   CardiaSepsis Labs: Recent Labs  Lab 04/04/19 1930 04/05/19 0236  LATICACIDVEN 2.3*  1.0    Recent Results (from the past 240 hour(s))  SARS CORONAVIRUS 2 (TAT 6-24 HRS) Nasopharyngeal Nasopharyngeal Swab     Status: None   Collection Time: 04/03/19  6:35 AM   Specimen: Nasopharyngeal Swab  Result Value Ref Range Status   SARS Coronavirus 2 NEGATIVE NEGATIVE Final    Comment: (NOTE) SARS-CoV-2 target nucleic acids are NOT DETECTED. The SARS-CoV-2 RNA is generally detectable in upper and lower respiratory specimens during the acute phase of infection. Negative results do not preclude SARS-CoN. [address] [place], [place] [address]   Blood Culture (routine x 2)     Status: None   Collection Time: 04/04/19  7:00 PM   Specimen: BLOOD  Result Value Ref Range Status   Specimen Description BLOOD BLOOD LEFT HAND  Final   Special Requests AEROBIC BOTTLE ONLY Blood Culture adequate volume  Final   Culture   Final    NO GROWTH 5 DAYS Performed at [facility], [place] [address], [place], [place] [address]    Report  Status 04/10/2019 FINAL  Final  Blood Culture (routine x 2)     Status: None   Collection Time: 04/04/19  8:30 PM   Specimen: BLOOD LEFT ARM  Result Value Ref Range Status   Specimen Description BLOOD LEFT ARM  Final   Special Requests   Final    BOTTLES DRAWN AEROBIC AND ANAEROBIC Blood Culture results may not be optimal due to an inadequate volume of blood received in culture bottles   Culture   Final    NO GROWTH 5 DAYS Performed at [facility], [place] [address] [place], [place] [address]    Report Status 04/09/2019 FINAL  Final  SARS CORONAVIRUS 2 (TAT 6-24 HRS) Nasopharyngeal Nasopharyngeal Swab     Status: None   Collection Time: 04/04/19 10:27 PM   Specimen: Nasopharyngeal Swab  Result Value Ref Range Status   SARS Coronavirus 2 NEGATIVE NEGATIVE Final    Comment: (NOTE) SARS-CoV-2 target nucleic acids are NOT DETECTED. The SARS-CoV-2 RNA is generally detectable in upper and lower respiratory specimens during the acute phase of infection. Negative results do not preclude SA  Urine culture     Status: Abnormal   Collection Time: 04/05/19  2:36 AM   Specimen: In/Out Cath Urine  Result Value Ref Range Status   Specimen Description IN/OUT CATH URINE  Final   Special Requests   Final    NONE Performed at [facility], [place] [address] [place], [place] [address]    Culture >=100,000 COLONIES/mL KLEBSIELLA PNEUMONIAE (A)  Final   Report Status 04/08/2019 FINAL  Final   Organism ID, Bacteria KLEBSIELLA PNEUMONIAE (A)  Final      Susceptibility   Klebsiella pneumoniae - MIC*    AMPICILLIN >=32 RESISTANT Resistant     CEFAZOLIN <=4 SENSITIVE Sensitive     CEFTRIAXONE <=1 SENSITIVE Sensitive     CIPROFLOXACIN <=0.25 SENSITIVE Sensitive     GENTAMICIN <=1 SENSITIVE Sensitive     IMIPENEM <=0.25 SENSITIVE Sensitive     NITROFURANTOIN 64 INTERMEDIATE Intermediate     TRIMETH/SULFA <=20 SENSITIVE Sensitive     AMPICILLIN/SULBACTAM 16 INTERMEDIATE Intermediate      PIP/TAZO <=4 SENSITIVE Sensitive     Extended ESBL NEGATIVE Sensitive     * >=100,000 COLONIES/mL KLEBSIELLA PNEUMONIAE      Radiology Studies: No results found.  Scheduled Meds: . alteplase  2 mg Intracatheter Once  . aspirin  324 mg Oral Once  .  aspirin EC  81 mg Oral Daily  . atorvastatin  40 mg Oral q1800  . chlordiazePOXIDE  25 mg Oral TID  . ciprofloxacin  500 mg Oral BID  . folic acid  1 mg Oral Daily  . lisinopril  20 mg Oral Daily  . mouth rinse  15 mL Mouth Rinse BID  . mometasone-formoterol  2 puff Inhalation BID  . multivitamin with minerals  1 tablet Oral Daily  . nicotine  21 mg Transdermal Daily  . thiamine  100 mg Oral Daily   Or  . thiamine  100 mg Intravenous Daily  . umeclidinium bromide  1 puff Inhalation Daily   Continuous Infusions: . banana bag IV 1000 mL 125 mL/hr at 04/09/19 1900     LOS: 5 days   Time spent: 26 minutes   Deatra James, MD [facility]  04/10/2019, 12:55 PM   To contact the attending provider between 7A-7P or the covering provider during after hours 7P-7A, please log into the web site [email or web address] and use password [email or web address].

## 2019-04-10 NOTE — Progress Notes (Signed)
Patient seen with sitter in the room, he still appears to be very weak, however able to respond to some questions. He was oriented x 3 (year, which hospital and self). Will reassess once he is more alert to consider myoview.

## 2019-04-11 LAB — CBC WITH DIFFERENTIAL/PLATELET
Abs Immature Granulocytes: 0.03 10*3/uL (ref 0.00–0.07)
Basophils Absolute: 0 10*3/uL (ref 0.0–0.1)
Basophils Relative: 0 %
Eosinophils Absolute: 0.2 10*3/uL (ref 0.0–0.5)
Eosinophils Relative: 3 %
HCT: 37.1 % — ABNORMAL LOW (ref 39.0–52.0)
Hemoglobin: 12.7 g/dL — ABNORMAL LOW (ref 13.0–17.0)
Immature Granulocytes: 0 %
Lymphocytes Relative: 21 %
Lymphs Abs: 1.5 10*3/uL (ref 0.7–4.0)
MCH: 36.2 pg — ABNORMAL HIGH (ref 26.0–34.0)
MCHC: 34.2 g/dL (ref 30.0–36.0)
MCV: 105.7 fL — ABNORMAL HIGH (ref 80.0–100.0)
Monocytes Absolute: 0.9 10*3/uL (ref 0.1–1.0)
Monocytes Relative: 13 %
Neutro Abs: 4.2 10*3/uL (ref 1.7–7.7)
Neutrophils Relative %: 63 %
Platelets: 213 10*3/uL (ref 150–400)
RBC: 3.51 MIL/uL — ABNORMAL LOW (ref 4.22–5.81)
RDW: 13 % (ref 11.5–15.5)
WBC: 6.8 10*3/uL (ref 4.0–10.5)
nRBC: 0 % (ref 0.0–0.2)

## 2019-04-11 LAB — COMPREHENSIVE METABOLIC PANEL
ALT: 26 U/L (ref 0–44)
AST: 37 U/L (ref 15–41)
Albumin: 2.5 g/dL — ABNORMAL LOW (ref 3.5–5.0)
Alkaline Phosphatase: 56 U/L (ref 38–126)
Anion gap: 8 (ref 5–15)
BUN: 5 mg/dL — ABNORMAL LOW (ref 8–23)
CO2: 19 mmol/L — ABNORMAL LOW (ref 22–32)
Calcium: 7.9 mg/dL — ABNORMAL LOW (ref 8.9–10.3)
Chloride: 115 mmol/L — ABNORMAL HIGH (ref 98–111)
Creatinine, Ser: 0.83 mg/dL (ref 0.61–1.24)
GFR calc Af Amer: >60 mL/min (ref >60)
GFR calc non Af Amer: >60 mL/min (ref >60)
Glucose, Bld: 129 mg/dL — ABNORMAL HIGH (ref 70–99)
Potassium: 3 mmol/L — ABNORMAL LOW (ref 3.5–5.1)
Sodium: 142 mmol/L (ref 135–145)
Total Bilirubin: 0.5 mg/dL (ref 0.3–1.2)
Total Protein: 5.2 g/dL — ABNORMAL LOW (ref 6.5–8.1)

## 2019-04-11 LAB — MAGNESIUM: Magnesium: 1.4 mg/dL — ABNORMAL LOW (ref 1.7–2.4)

## 2019-04-11 MED ORDER — MAGNESIUM SULFATE 2 GM/50ML IV SOLN
2.0000 g | Freq: Once | INTRAVENOUS | Status: AC
  Administered 2019-04-11: 2 g via INTRAVENOUS
  Filled 2019-04-11: qty 50

## 2019-04-11 MED ORDER — POTASSIUM CHLORIDE CRYS ER 20 MEQ PO TBCR
40.0000 meq | EXTENDED_RELEASE_TABLET | Freq: Once | ORAL | Status: AC
  Administered 2019-04-11: 40 meq via ORAL
  Filled 2019-04-11: qty 2

## 2019-04-11 MED ORDER — LISINOPRIL 40 MG PO TABS
40.0000 mg | ORAL_TABLET | Freq: Every day | ORAL | Status: DC
  Administered 2019-04-12 – 2019-04-13 (×2): 40 mg via ORAL
  Filled 2019-04-11 (×2): qty 1

## 2019-04-11 MED ORDER — CHLORDIAZEPOXIDE HCL 25 MG PO CAPS
25.0000 mg | ORAL_CAPSULE | Freq: Three times a day (TID) | ORAL | Status: DC | PRN
  Administered 2019-04-11 – 2019-04-12 (×4): 25 mg via ORAL
  Filled 2019-04-11 (×4): qty 1

## 2019-04-11 NOTE — Evaluation (Signed)
Occupational Therapy Evaluation Patient Details Name: [name] Pace MRN: [id number] DOB: [birth year] Today's Date: 04/11/2019    History of Present Illness Pt adm with chest pain and began having etoh withdrawal. PMH - CVA, CAD, CABG, cocaine and etoh abuse, lumbar spinal stenosis, T6-8 compression fx's   Clinical Impression   Pt admitted with the above diagnoses and presents with below problem list. Pt will benefit from continued acute OT to address the below listed deficits and maximize independence with basic ADLs prior to d/c to next venue. Unclear baseline assist level with ADLs as pt is currently not a reliable historian. He does endorse using a rw and having chronic L knee pain. Pt received laying in bed, appearing lethargic. Increased alertness noted once sitting EOB. Pt able to state his location and year. Impulsive, decreased insight into deficits, balance deficits, and poor safety awareness impacting basic ADLs. Pt needing min-mod A for LB ADLs and functional mobility, +2 for safety and chair follow. Pt with poor ability to self-regulate activity and needed to be cued to sit down 2/2 visible unsteadiness and fatigue. High fall risk. Recommend ST SNF for continued rehab but noted from chart review he is likely to end up d/c home.       Follow Up Recommendations  SNF;Supervision/Assistance - 24 hour    Equipment Recommendations  None recommended by OT    Recommendations for Other Services       Precautions / Restrictions Precautions Precautions: Fall Precaution Comments: decreased safety awareness Restrictions Weight Bearing Restrictions: No      Mobility Bed Mobility Overal bed mobility: Needs Assistance Bed Mobility: Supine to Sit     Supine to sit: Min guard;HOB elevated     General bed mobility comments: extra time and effort. min guard for safety. lethargic in supine more alert sitting EOB.  Transfers Overall transfer level: Needs assistance Equipment used:  Rolling walker (2 wheeled) Transfers: Sit to/from Stand Sit to Stand: Min assist         General transfer comment: Assist to bring hips up and for balance. Pt reports baseline issues with L knee.    Balance Overall balance assessment: Needs assistance Sitting-balance support: No upper extremity supported;Feet supported Sitting balance-Leahy Scale: Fair Sitting balance - Comments: sits in trunk flexed position   Standing balance support: Bilateral upper extremity supported Standing balance-Leahy Scale: Poor Standing balance comment: walker and min guard for static standing                           ADL either performed or assessed with clinical judgement   ADL Overall ADL's : Needs assistance/impaired Eating/Feeding: Set up;Sitting   Grooming: Minimal assistance;Sitting   Upper Body Bathing: Minimal assistance;Sitting   Lower Body Bathing: Moderate assistance;Sit to/from stand;+2 for safety/equipment   Upper Body Dressing : Minimal assistance;Sitting   Lower Body Dressing: Moderate assistance;+2 for safety/equipment   Toilet Transfer: Minimal assistance;+2 for safety/equipment;Ambulation;RW;Moderate assistance   Toileting- Clothing Manipulation and Hygiene: Moderate assistance;Sit to/from stand       Functional mobility during ADLs: Minimal assistance;+2 for safety/equipment;Rolling walker General ADL Comments: Pt completed bed mobility, household distance functional mobility, and simulated toilet transfer.     Vision         Perception     Praxis      Pertinent Vitals/Pain Pain Assessment: Faces Pain Score: 3  Faces Pain Scale: Hurts little more Pain Location: chest Pain Descriptors / Indicators: Burning Pain Intervention(s): Monitored  during session;Limited activity within patient's tolerance     Hand Dominance Right   Extremity/Trunk Assessment Upper Extremity Assessment Upper Extremity Assessment: Generalized weakness   Lower Extremity  Assessment Lower Extremity Assessment: Defer to PT evaluation       Communication Communication Communication: No difficulties(mumbled speech at times)   Cognition Arousal/Alertness: Awake/alert Behavior During Therapy: Flat affect;Impulsive Overall Cognitive Status: No family/caregiver present to determine baseline cognitive functioning                                 General Comments: Pt currently with etoh withdrawal. Lethargic upon OT arrival lying in bed. More alert once sitting EOB. Once alert and sitting EOB able to state that he was at the hospital and correctly stated year. Impulsive and decreased insight into deficits. High fall risk.   General Comments       Exercises     Shoulder Instructions      Home Living Family/patient expects to be discharged to:: Private residence Living Arrangements: Spouse/significant other("girlfriend")   Type of Home: Apartment Home Access: Stairs to enter [employer] of Steps: 2 flights Entrance Stairs-Rails: Right;Left;Can reach both Home Layout: One level     Bathroom Shower/Tub: Teacher, early years/pre: Standard     Home Equipment: Environmental consultant - 2 wheels;Bedside commode   Additional Comments: Information from prior encounter. Unsure if currently accurate.       Prior Functioning/Environment Level of Independence: Independent with assistive device(s)        Comments: Uses rolling walker        OT Problem List: Decreased strength;Decreased activity tolerance;Impaired balance (sitting and/or standing);Decreased cognition;Decreased safety awareness;Decreased knowledge of use of DME or AE;Decreased knowledge of precautions;Pain      OT Treatment/Interventions: Self-care/ADL training;Therapeutic exercise;Energy conservation;DME and/or AE instruction;Therapeutic activities;Cognitive remediation/compensation;Patient/family education;Balance training    OT Goals(Current goals can be found in the  care plan section) Acute Rehab OT Goals Patient Stated Goal: not stated OT Goal Formulation: With patient Time For Goal Achievement: 04/25/19 Potential to Achieve Goals: Good ADL Goals Pt Will Perform Grooming: with modified independence;sitting;standing;with supervision Pt Will Perform Upper Body Bathing: with set-up;sitting Pt Will Perform Lower Body Bathing: sit to/from stand;with min guard assist Pt Will Transfer to Toilet: ambulating;with supervision Pt Will Perform Toileting - Clothing Manipulation and hygiene: with supervision;sit to/from stand  OT Frequency: Min 2X/week   Barriers to D/C: Decreased caregiver support  unclear what available assist level is at home       Co-evaluation              AM-PAC OT "6 Clicks" Daily Activity     Outcome Measure Help from another person eating meals?: None Help from another person taking care of personal grooming?: A Little Help from another person toileting, which includes using toliet, bedpan, or urinal?: A Lot Help from another person bathing (including washing, rinsing, drying)?: A Lot Help from another person to put on and taking off regular upper body clothing?: A Little Help from another person to put on and taking off regular lower body clothing?: A Lot 6 Click Score: 16   End of Session Equipment Utilized During Treatment: Gait belt;Rolling walker Nurse Communication: Other (comment)(pt r/o chest "burning")  Activity Tolerance: Patient limited by pain;Patient limited by fatigue;Patient tolerated treatment well Patient left: in chair;with call bell/phone within reach;with chair alarm set;with nursing/sitter in room  OT Visit Diagnosis: Unsteadiness on feet (R26.81);Muscle  weakness (generalized) (M62.81);Pain;Adult, failure to thrive (R62.7);Other symptoms and signs involving cognitive function;Other abnormalities of gait and mobility (R26.89) Pain - Right/Left: Left Pain - part of body: Knee                Time:  [id number] OT Time Calculation (min): 30 min Charges:  OT General Charges $OT Visit: 1 Visit OT Evaluation $OT Eval Low Complexity: 1 Low OT Treatments $Self Care/Home Management : 8-22 mins  Tyrone Schimke, OT Acute Rehabilitation Services Pager: [phone] Office: [phone]   Hortencia Pilar 04/11/2019, 11:55 AM

## 2019-04-11 NOTE — Progress Notes (Signed)
PROGRESS NOTE    [name] Pace  [id number] DOB: March 01, 1951 DOA: 04/04/2019 PCP: Patient, No Pcp Per   Brief Narrative:  [name] Pace is a 68 y.o. male with history of CAD status post CABG, polysubstance abuse, prostate cancer presented to ED with complaint of chest pain for last few days without any shortness of breath or any radiation.  Upon arrival to ER, patient was afebrile chest x-ray does not show anything acute x-ray of the T-spine shows chronic changes. CT of the head was unremarkable.  EKG shows normal sinus rhythm.  High-sensitivity troponins were 20 and 29.  Lactic acid initially was 2.3 improved with 2 1.  UA shows features concerning for UTI.  WBC count was 12.1 COVID-19 was negative.  Given history of CAD patient admitted for chest pain and further work-up. ER provider on exam felt that patient had some abdominal discomfort for which CT abdomen was done which shows urinary bladder thickening.  Cardiology was consulted.  ACS ruled out.  Plan was for nuclear stress test on Saturday however overnight, patient started having full-blown alcohol withdrawal so this was canceled.  The patient was admitted to rule out ACS, with extensive history of coronary artery disease CABG, cardiology was consulted, but patient subsequently went through DTs due to extensive alcohol use/abuse.  Patient was successfully put through a detox with CIWA protocol and Librium. Patient continued to improve Also noted for urinary tract infection, was treated with antibiotics. -------------------------------------------------------------------------------------------------------------------------------------------------   Subjective : The patient was seen and examined this morning, nursing staff present at bedside, nursing staff report less agitation and confusion. Still requesting more pain medications.  Social care/case management reported patient has refused SNF, and home health at this time.  He  remained stable denies any chest pain shortness of breath.   Assessment & Plan:   Principal Problem:   Chest pain Active Problems:   Alcohol abuse   CAD (coronary artery disease)   Elevated troponin   Severe alcohol use disorder (HCC)  Chest pain -in a history of coronary artery disease and h/o CABG -Stable denies any chest pain or shortness of breath this morning  -Cardiology recommends no further intervention or stress test at this time -Cardiology following -We will continue optimizing medical management -Echo reviewed  EtOH abuse/dependence/withdrawal -Continue treatment with CIWA protocol, thiamine, folate, -Improving mentation--still mildly confused, agitated -Patient was noted to be on Librium 25 mg p.o. 3 times daily We will DC his Librium today, will change it to as needed  Urine tract infection/possible prostatitis -Urine culture positive for Proteus, pansensitive -Continue current antibiotics of ciprofloxacin -Due to complicated UTI, anticipating prostatitis with prolonged antibiotics to possibly 10 to 14 days Today is 7 of ciprofloxacin  Hypokalemia: Monitoring and repleting  Hypomagnesemia: Monitoring and repleting  Hypertension: -Blood pressure remained stable -Continue current medication lisinopril -As needed hydralazine   Atelectasis: Not hypoxic anymore.  DVT prophylaxis: Lovenox Code Status: Full code Family Communication: None present at bedside. Disposition Plan: To be determined  Estimated body mass index is 18.68 kg/m as calculated from the following:   Height as of this encounter: 6\' 3"  (1.905 m).   Weight as of this encounter: 67.8 kg.    Consultants:   Cardiology  Procedures:   None  Antimicrobials:   Ciprofloxacin   2D echocardiogram: Echo 12/02/2018 1. The left ventricle has a visually estimated ejection fraction of 55%. The cavity size was mildly dilated. Left ventricular diastolic Doppler parameters are consistent with  pseudonormalization. Basal inferior  hypokinesis. 2. Left atrial size was moderately dilated. 3. No evidence of mitral valve stenosis. Trivial mitral regurgitation. 4. The aortic valve is tricuspid. Mild calcification of the aortic valve. No stenosis of the aortic valve. 5. There is mild dilatation of the aortic root measuring 41 mm. 6. Normal IVC size. No complete TR doppler jet so unable to estimate PA systolic pressure. 7. The right ventricle has normal systolic function. The cavity was normal. There is no increase in right ventricular wall thickness.    Objective: Vitals:   04/11/19 0452 04/11/19 0526 04/11/19 0819 04/11/19 0839  BP: (!) 144/83 (!) 154/74 (!) 166/89   Pulse: 75 76 77   Resp:   (!) 21   Temp: 98.2 F (36.8 C)  97.8 F (36.6 C)   TempSrc: Oral  Oral   SpO2:    98%  Weight: 67.8 kg     Height:        Intake/Output Summary (Last 24 hours) at 04/11/2019 1216 Last data filed at 04/11/2019 [id number] Gross per 24 hour  Intake 2708 ml  Output 800 ml  Net 1908 ml   Filed Weights   04/07/19 0638 04/08/19 0327 04/11/19 0452  Weight: 62.4 kg 64.6 kg 67.8 kg     Physical Exam  Constitution:  Alert, cooperative, no distress,  HEENT: Normocephalic, PERRL, otherwise with in Normal limits  Chest:Chest symmetric Cardio vascular:  S1/S2, RRR, No murmure, No Rubs or Gallops  pulmonary: Clear to auscultation bilaterally, respirations unlabored, negative wheezes / crackles Abdomen: Soft, non-tender, non-distended, bowel sounds,no masses, no organomegaly Muscular skeletal: Limited exam - in bed, able to move all 4 extremities, Normal strength,  Neuro: CNII-XII intact. , normal motor and sensation, reflexes intact  Extremities: No pitting edema lower extremities, +2 pulses  Skin: Dry, warm to touch, negative for any Rashes, No open wounds Wounds: per nursing documentation     Data Reviewed: I have personally reviewed following labs and imaging studies  CBC: Recent  Labs  Lab 04/07/19 0405 04/08/19 0409 04/09/19 0440 04/10/19 0754 04/11/19 0317  WBC 8.3 10.0 7.5 7.3 6.8  NEUTROABS 6.3 7.3 5.3 4.5 4.2  HGB 15.6 15.6 13.1 12.6* 12.7*  HCT 43.9 43.5 38.3* 35.6* 37.1*  MCV 104.5* 103.3* 106.7* 103.5* 105.7*  PLT 101* 191 210 188 [id number]   Basic Metabolic Panel: Recent Labs  Lab 04/05/19 0525 04/07/19 0405 04/08/19 0409 04/08/19 0939 04/09/19 0440 04/11/19 0935  NA 137 138 140  --  141 142  K 4.0 4.1 3.7  --  2.6* 3.0*  CL 102 107 111  --  108 115*  CO2 18* 18* 17*  --  23 19*  GLUCOSE 72 118* 80  --  100* 129*  BUN 7* 9 <5*  --  <5* 5*  CREATININE 0.87 0.75 0.74  --  0.76 0.83  CALCIUM 8.2* 8.5* 8.0*  --  8.0* 7.9*  MG 1.7 1.6*  --  1.5* 1.5*  --   PHOS 4.0  --   --   --   --   --    GFR: Estimated Creatinine Clearance: 84 mL/min (by C-G formula based on SCr of 0.83 mg/dL). Liver Function Tests: Recent Labs  Lab 04/04/19 1939 04/05/19 0525 04/11/19 0935  AST 49* 34 37  ALT 31 23 26   ALKPHOS 104 81 56  BILITOT 2.2* 1.5* 0.5  PROT 7.4 6.1* 5.2*  ALBUMIN 4.1 3.0* 2.5*   Recent Labs  Lab 04/04/19 1939  LIPASE 24   Recent  Labs  Lab 04/04/19 1939  AMMONIA 24   Coagulation Profile: Recent Labs  Lab 04/04/19 1939 04/05/19 0236  INR 0.9 1.0   CardiaSepsis Labs: Recent Labs  Lab 04/04/19 1930 04/05/19 0236  LATICACIDVEN 2.3* 1.0    Recent Results (from the past 240 hour(s))  SARS CORONAVIRUS 2 (TAT 6-24 HRS) Nasopharyngeal Nasopharyngeal Swab     Status: None   Collection Time: 04/03/19  6:35 AM   Specimen: Nasopharyngeal Swab  Result Value Ref Range Status   SARS Coronavirus 2 NEGATIVE NEGATIVE Final    Comment: (NOTE) SARS-CoV-2 target nucleic acids are NOT DETECTED. The SARS-CoV-2 RNA is generally detectable in upper and lower respiratory specimens during the acute phase of infection. Negative results do not preclude SARS-CoN. [address], [place], [place] [address]   Blood Culture (routine x 2)     Status: None    Collection Time: 04/04/19  7:00 PM   Specimen: BLOOD  Result Value Ref Range Status   Specimen Description BLOOD BLOOD LEFT HAND  Final   Special Requests AEROBIC BOTTLE ONLY Blood Culture adequate volume  Final   Culture   Final    NO GROWTH 5 DAYS Performed at [facility], [place] [address], [place], [place] [address]    Report Status 04/10/2019 FINAL  Final  Blood Culture (routine x 2)     Status: None   Collection Time: 04/04/19  8:30 PM   Specimen: BLOOD LEFT ARM  Result Value Ref Range Status   Specimen Description BLOOD LEFT ARM  Final   Special Requests   Final    BOTTLES DRAWN AEROBIC AND ANAEROBIC Blood Culture results may not be optimal due to an inadequate volume of blood received in culture bottles   Culture   Final    NO GROWTH 5 DAYS Performed at [facility], [place] [address] [place],  [address]    Report Status 04/09/2019 FINAL  Final  SARS CORONAVIRUS 2 (TAT 6-24 HRS) Nasopharyngeal Nasopharyngeal Swab     Status: None   Collection Time: 04/04/19 10:27 PM   Specimen: Nasopharyngeal Swab  Result Value Ref Range Status   SARS Coronavirus 2 NEGATIVE NEGATIVE Final    Comment: (NOTE) SARS-CoV-2 target nucleic acids are NOT DETECTED. The SARS-CoV-2 RNA is generally detectable in upper and lower respiratory specimens during the acute phase of infection. Negative results do not preclude SA  Urine culture     Status: Abnormal   Collection Time: 04/05/19  2:36 AM   Specimen: In/Out Cath Urine  Result Value Ref Range Status   Specimen Description IN/OUT CATH URINE  Final   Special Requests   Final    NONE Performed at [facility], [place] [address] [place],  [address]    Culture >=100,000 COLONIES/mL KLEBSIELLA PNEUMONIAE (A)  Final   Report Status 04/08/2019 FINAL  Final   Organism ID, Bacteria KLEBSIELLA PNEUMONIAE (A)  Final      Susceptibility   Klebsiella pneumoniae - MIC*    AMPICILLIN >=32 RESISTANT Resistant     CEFAZOLIN <=4  SENSITIVE Sensitive     CEFTRIAXONE <=1 SENSITIVE Sensitive     CIPROFLOXACIN <=0.25 SENSITIVE Sensitive     GENTAMICIN <=1 SENSITIVE Sensitive     IMIPENEM <=0.25 SENSITIVE Sensitive     NITROFURANTOIN 64 INTERMEDIATE Intermediate     TRIMETH/SULFA <=20 SENSITIVE Sensitive     AMPICILLIN/SULBACTAM 16 INTERMEDIATE Intermediate     PIP/TAZO <=4 SENSITIVE Sensitive     Extended ESBL  NEGATIVE Sensitive     * >=100,000 COLONIES/mL KLEBSIELLA PNEUMONIAE      Radiology Studies: No results found.  Scheduled Meds: . alteplase  2 mg Intracatheter Once  . aspirin  324 mg Oral Once  . aspirin EC  81 mg Oral Daily  . atorvastatin  40 mg Oral q1800  . ciprofloxacin  500 mg Oral BID  . folic acid  1 mg Oral Daily  . lisinopril  20 mg Oral Daily  . mouth rinse  15 mL Mouth Rinse BID  . mometasone-formoterol  2 puff Inhalation BID  . multivitamin with minerals  1 tablet Oral Daily  . nicotine  21 mg Transdermal Daily  . thiamine  100 mg Oral Daily   Or  . thiamine  100 mg Intravenous Daily  . umeclidinium bromide  1 puff Inhalation Daily   Continuous Infusions: . banana bag IV 1000 mL 125 mL/hr at 04/11/19 0917     LOS: 6 days   Time spent: 26 minutes   Deatra James, MD [facility]  04/11/2019, 12:16 PM   To contact the attending provider between 7A-7P or the covering provider during after hours 7P-7A, please log into the web site [email or web address] and use password [email or web address].

## 2019-04-11 NOTE — Plan of Care (Signed)
  Problem: Clinical Measurements: Goal: Respiratory complications will improve Outcome: Progressing Note: Stable on room air.  No s/s of respiratory complications noted. Goal: Cardiovascular complication will be avoided Outcome: Progressing Note: NSR on telemetry.  No s/s of cardiovascular complication noted.   

## 2019-04-11 NOTE — Progress Notes (Signed)
  Speech Language Pathology Treatment: Dysphagia  Patient Details Name: [name] Pace MRN: [id number] DOB: Apr 05, 1951 Today's Date: 04/11/2019 Time: 6010-9323 SLP Time Calculation (min) (ACUTE ONLY): 20 min  Assessment / Plan / Recommendation Clinical Impression  Patient finishing breakfast on arrival. Observed pt safely tolerating Dys 3, thin liquid diet. Sitter in room reported pt coughed several times during meal usually when noticed to be eating fast. Reviewed and provided written instruction on compensatory strategies to reduce risk of aspiration. Slow rate, small bites, sips, alternating liquids and solids, sitting at 90 degrees for meals/up in chair if possible and sitting up for 30 minute period after intake. Patient received education well, verbalized understanding. Recommend diet upgrade to regular, thin liquids, medication whole with liquids. All swallowing goals met and ST to sign off.    HPI HPI: [name] Pace is a 68 y.o. male with history of CAD status post CABG, polysubstance abuse, prostate cancer presented to ED with complaint of chest pain for last few days without any shortness of breath or any radiation.  Upon arrival to ER, patient was afebrile chest x-ray does not show anything acute x-ray of the T-spine shows chronic changes. CT of the head was unremarkable. CXR 11/1 showed no acute disease.  Pt known to this service from prior admission Dec 2019-Jan 2020 with dysphagia post extubation which resolved      SLP Plan  All goals met       Recommendations  Diet recommendations: Regular;Thin liquid Liquids provided via: Cup;Straw Medication Administration: Whole meds with liquid Supervision: Patient able to self feed;Intermittent supervision to cue for compensatory strategies Compensations: Slow rate;Small sips/bites Postural Changes and/or Swallow Maneuvers: Seated upright 90 degrees;Upright 30-60 min after meal                Plan: All goals met        GO                Wynelle Bourgeois, MA, CCC-SLP 04/11/2019, 9:10 AM

## 2019-04-11 NOTE — Progress Notes (Signed)
Pharmacy Antibiotic Note  [name] Pace is a 68 y.o. male admitted on 04/04/2019 with UTI.  Pharmacy has been consulted for Cipro dosing (on day 7).  -urine cultures show klebsiella (sensitive to cipro) -tolerating po medications  Plan: -Change to po Ciprofloxacin 500mg  Q12H -Could consider stopping today  Will sign off. Please contact pharmacy with any other needs.  Thank you Hildred Laser, PharmD Clinical Pharmacist **Pharmacist phone directory can now be found on [email or web address] ([email or web address]).  Listed under [place].

## 2019-04-12 ENCOUNTER — Inpatient Hospital Stay (HOSPITAL_COMMUNITY): Payer: Medicare Other

## 2019-04-12 LAB — BASIC METABOLIC PANEL
Anion gap: 7 (ref 5–15)
BUN: 7 mg/dL — ABNORMAL LOW (ref 8–23)
CO2: 20 mmol/L — ABNORMAL LOW (ref 22–32)
Calcium: 7.7 mg/dL — ABNORMAL LOW (ref 8.9–10.3)
Chloride: 114 mmol/L — ABNORMAL HIGH (ref 98–111)
Creatinine, Ser: 0.72 mg/dL (ref 0.61–1.24)
GFR calc Af Amer: >60 mL/min (ref >60)
GFR calc non Af Amer: >60 mL/min (ref >60)
Glucose, Bld: 110 mg/dL — ABNORMAL HIGH (ref 70–99)
Potassium: 3.3 mmol/L — ABNORMAL LOW (ref 3.5–5.1)
Sodium: 141 mmol/L (ref 135–145)

## 2019-04-12 LAB — CBC WITH DIFFERENTIAL/PLATELET
Abs Immature Granulocytes: 0.02 10*3/uL (ref 0.00–0.07)
Basophils Absolute: 0.1 10*3/uL (ref 0.0–0.1)
Basophils Relative: 1 %
Eosinophils Absolute: 0.2 10*3/uL (ref 0.0–0.5)
Eosinophils Relative: 3 %
HCT: 35.5 % — ABNORMAL LOW (ref 39.0–52.0)
Hemoglobin: 12 g/dL — ABNORMAL LOW (ref 13.0–17.0)
Immature Granulocytes: 0 %
Lymphocytes Relative: 15 %
Lymphs Abs: 1 10*3/uL (ref 0.7–4.0)
MCH: 36.3 pg — ABNORMAL HIGH (ref 26.0–34.0)
MCHC: 33.8 g/dL (ref 30.0–36.0)
MCV: 107.3 fL — ABNORMAL HIGH (ref 80.0–100.0)
Monocytes Absolute: 1 10*3/uL (ref 0.1–1.0)
Monocytes Relative: 15 %
Neutro Abs: 4.4 10*3/uL (ref 1.7–7.7)
Neutrophils Relative %: 66 %
Platelets: 249 10*3/uL (ref 150–400)
RBC: 3.31 MIL/uL — ABNORMAL LOW (ref 4.22–5.81)
RDW: 12.9 % (ref 11.5–15.5)
WBC: 6.7 10*3/uL (ref 4.0–10.5)
nRBC: 0 % (ref 0.0–0.2)

## 2019-04-12 LAB — PSA: Prostatic Specific Antigen: <0.01 ng/mL (ref 0.00–4.00)

## 2019-04-12 MED ORDER — CIPROFLOXACIN HCL 500 MG PO TABS
500.0000 mg | ORAL_TABLET | Freq: Two times a day (BID) | ORAL | Status: DC
  Administered 2019-04-12 – 2019-04-13 (×4): 500 mg via ORAL
  Filled 2019-04-12 (×5): qty 1

## 2019-04-12 MED ORDER — METOPROLOL TARTRATE 25 MG PO TABS
25.0000 mg | ORAL_TABLET | Freq: Two times a day (BID) | ORAL | Status: DC
  Administered 2019-04-12 – 2019-04-13 (×4): 25 mg via ORAL
  Filled 2019-04-12 (×5): qty 1

## 2019-04-12 NOTE — Plan of Care (Signed)
  Problem: Nutrition: Goal: Adequate nutrition will be maintained Outcome: Progressing Note: Good appetite   Problem: Coping: Goal: Level of anxiety will decrease Outcome: Progressing Note: PO meds effective.

## 2019-04-12 NOTE — TOC Progression Note (Signed)
Transition of Care [facility]) - Progression Note    Patient Details  Name: [name] Pace MRN: [id number] Date of Birth: [birth year]  Transition of Care [facility]) CM/SW Contact  Graves-Bigelow, Ocie Cornfield, RN Phone Number: 04/12/2019, 10:55 AM  Clinical Narrative:  Patient has declined SNF and [facility] Services- PT worked with patient today- still not ready to be transitioned home safely. CM did discuss with PT @ 1056 and therapist states that patient will be on the list for Saturday therapy to see if the patient progresses further. CM will continue to follow for additional transition of care needs.    Expected Discharge Plan: Home/Self Care(Pt is declining SNF at this time.) Barriers to Discharge: Continued Medical Work up  Expected Discharge Plan and Services Expected Discharge Plan: Home/Self Care(Pt is declining SNF at this time.) In-house Referral: NA Discharge Planning Services: CM Consult   Living arrangements for the past 2 months: Apartment                 HH Arranged: Refused HH, Refused SNF     Social Determinants of Health (SDOH) Interventions    Readmission Risk Interventions Readmission Risk Prevention Plan 04/10/2019  Transportation Screening Complete  Medication Review Press photographer) Complete  PCP or Specialist appointment within 3-5 days of discharge Complete  HRI or [place] Complete  SW Recovery Care/Counseling Consult Complete  [place] Patient Refused  Some recent data might be hidden

## 2019-04-12 NOTE — Progress Notes (Signed)
Physical Therapy Treatment Patient Details Name: [name] Pace MRN: [id number] DOB: [birth year] Today's Date: 04/12/2019    History of Present Illness Pt adm with chest pain and began having etoh withdrawal. PMH - CVA, CAD, CABG, cocaine and etoh abuse, lumbar spinal stenosis, T6-8 compression fx's    PT Comments    Patient received in bed, sitter present. Alert. Agrees to walk. Reports chest burning is better, but upon sitting up reports burning chest pain returned. Patient requires min guard with bed mobility, transfers sit to stand with min assist and cues for safety. He is able to ambulate 150 feet with RW. Cues needed for posture, to improve foot clearance and increase step length. Patient requires min assist for ambulation for safety and was visibly fatigued after this distance. He will continue to benefit from skilled PT to improve safety and independence with mobility.     Follow Up Recommendations  SNF     Equipment Recommendations  None recommended by PT    Recommendations for Other Services       Precautions / Restrictions Precautions Precautions: Fall Precaution Comments: decreased safety awareness Restrictions Weight Bearing Restrictions: No    Mobility  Bed Mobility Overal bed mobility: Needs Assistance Bed Mobility: Supine to Sit;Sit to Supine     Supine to sit: Min guard;HOB elevated Sit to supine: Min guard;HOB elevated   General bed mobility comments: increased time and effort needed  Transfers Overall transfer level: Needs assistance Equipment used: Rolling walker (2 wheeled) Transfers: Sit to/from Stand Sit to Stand: Min guard;From elevated surface            Ambulation/Gait Ambulation/Gait assistance: Min Web designer (Feet): 150 Feet Assistive device: Rolling walker (2 wheeled) Gait Pattern/deviations: Trunk flexed;Decreased stride length;Decreased step length - left;Shuffle;Step-through pattern Gait velocity: decr   General Gait  Details: Assist for balance and support. Verbal cues to stay closer to walker, take larger step and increase foot clearance on left.   Stairs             Wheelchair Mobility    Modified Rankin (Stroke Patients Only)       Balance Overall balance assessment: Needs assistance Sitting-balance support: Feet supported;Single extremity supported Sitting balance-Leahy Scale: Fair Sitting balance - Comments: sits in trunk flexed position   Standing balance support: Bilateral upper extremity supported Standing balance-Leahy Scale: Poor Standing balance comment: reliant on external support of RW and min guard                            Cognition Arousal/Alertness: Awake/alert Behavior During Therapy: WFL for tasks assessed/performed Overall Cognitive Status: No family/caregiver present to determine baseline cognitive functioning                                        Exercises Other Exercises Other Exercises: LE SLR, heel slides x 5 bilaterally    General Comments        Pertinent Vitals/Pain Pain Assessment: Faces Faces Pain Scale: Hurts little more Pain Location: chest Pain Descriptors / Indicators: Burning Pain Intervention(s): Limited activity within patient's tolerance;Monitored during session    Home Living                      Prior Function            PT Goals (current goals can  now be found in the care plan section) Acute Rehab PT Goals Patient Stated Goal: not stated PT Goal Formulation: Patient unable to participate in goal setting Progress towards PT goals: Progressing toward goals    Frequency    Min 3X/week      PT Plan Current plan remains appropriate    Co-evaluation              AM-PAC PT "6 Clicks" Mobility   Outcome Measure  Help needed turning from your back to your side while in a flat bed without using bedrails?: A Little Help needed moving from lying on your back to sitting on the  side of a flat bed without using bedrails?: A Little Help needed moving to and from a bed to a chair (including a wheelchair)?: A Little   Help needed to walk in hospital room?: A Little Help needed climbing 3-5 steps with a railing? : A Lot 6 Click Score: 14    End of Session Equipment Utilized During Treatment: Gait belt Activity Tolerance: Patient tolerated treatment well;Patient limited by fatigue Patient left: in bed;with nursing/sitter in room;with call bell/phone within reach Nurse Communication: Mobility status PT Visit Diagnosis: Unsteadiness on feet (R26.81);Muscle weakness (generalized) (M62.81);Difficulty in walking, not elsewhere classified (R26.2)     Time: [id number] PT Time Calculation (min) (ACUTE ONLY): 14 min  Charges:  $Gait Training: 8-22 mins                     Lorisa Scheid, PT, GCS 04/12/19,10:53 AM

## 2019-04-12 NOTE — Progress Notes (Addendum)
PROGRESS NOTE    [name] Pace  [id number] DOB: 31-Mar-1951 DOA: 04/04/2019 PCP: Patient, No Pcp Per   Brief Narrative:  [name] Pace is a 68 y.o. male with history of CAD status post CABG, polysubstance abuse, prostate cancer presented to ED with complaint of chest pain for last few days without any shortness of breath or any radiation.  Upon arrival to ER, patient was afebrile chest x-ray does not show anything acute x-ray of the T-spine shows chronic changes. CT of the head was unremarkable.  EKG shows normal sinus rhythm.  High-sensitivity troponins were 20 and 29.  Lactic acid initially was 2.3 improved with 2 1.  UA shows features concerning for UTI.  WBC count was 12.1 COVID-19 was negative.  Given history of CAD patient admitted for chest pain and further work-up. ER provider on exam felt that patient had some abdominal discomfort for which CT abdomen was done which shows urinary bladder thickening.  Cardiology was consulted.  ACS ruled out.  Plan was for nuclear stress test on Saturday however overnight, patient started having full-blown alcohol withdrawal so this was canceled.  The patient was admitted to rule out ACS, with extensive history of coronary artery disease CABG, cardiology was consulted, but patient subsequently went through DTs due to extensive alcohol use/abuse.  Patient was successfully put through a detox with CIWA protocol and Librium. Patient continued to improve Also noted for urinary tract infection, was treated with antibiotics. -------------------------------------------------------------------------------------------------------------------------------------------------   Subjective :  The patient was seen and examined this morning, tremors have improved, shortness of breath has improved.  Still complaining of chest pain. Generalized weaknesses.  Nursing staff present at bedside.  Social care/case management reported patient has refused SNF, and home  health at this time.  -------------------------------------------------------------------------------------------------------------------------------------------------   Assessment & Plan:   Principal Problem:   Chest pain Active Problems:   Alcohol abuse   CAD (coronary artery disease)   Elevated troponin   Severe alcohol use disorder (HCC)  Chest pain -in a history of coronary artery disease and h/o CABG -Once again this morning patient is complaining of chest pain, believe it is noncardiac in nature it looks like musculoskeletal --patient reports that his skin hurts/burning Ice pack present on bedside -No EKG changes were noted -We are obtaining a chest x-ray  -Cardiology recommends no further intervention or stress test at this time -Cardiology following -We will continue optimizing medical management -Echo reviewed  EtOH abuse/dependence/withdrawal -Patient has completed detox with CIWA/Librium -Continue supplement thiamine/folate -Tremors are improving-mentation improving   Urine tract infection/possible prostatitis -Urine culture positive for Proteus, pansensitive -Continue current antibiotics of ciprofloxacin -Due to complicated UTI, anticipating prostatitis with prolonged antibiotics to possibly 10 to 14 days   Hypokalemia: Monitoring and repleting  Hypomagnesemia: Monitoring and repleting  Accelerated hypertension: -Blood pressure remained elevated this a.m. -Titrating oral medication added metoprolol for better blood pressure control -As needed hydralazine   Atelectasis: Not hypoxic anymore.  Debility -PT/OT consulted for evaluation recommendation At this time patient refuses SNF or home health.   BLPleural effusion:  Addendum: Chest x-ray reviewed significant pleural effusion IR consulted for thoracentesis fluid cultures analysis ordered     DVT prophylaxis: Lovenox Code Status: Full code Family Communication: None present at bedside.  Disposition Plan: To be determined  Estimated body mass index is 18.63 kg/m as calculated from the following:   Height as of this encounter: 6\' 3"  (1.905 m).   Weight as of this encounter: 67.6 kg.  Consultants:   Cardiology  Procedures:   None  Antimicrobials:   Ciprofloxacin   2D echocardiogram: Echo 12/02/2018 1. The left ventricle has a visually estimated ejection fraction of 55%. The cavity size was mildly dilated. Left ventricular diastolic Doppler parameters are consistent with pseudonormalization. Basal inferior hypokinesis. 2. Left atrial size was moderately dilated. 3. No evidence of mitral valve stenosis. Trivial mitral regurgitation. 4. The aortic valve is tricuspid. Mild calcification of the aortic valve. No stenosis of the aortic valve. 5. There is mild dilatation of the aortic root measuring 41 mm. 6. Normal IVC size. No complete TR doppler jet so unable to estimate PA systolic pressure. 7. The right ventricle has normal systolic function. The cavity was normal. There is no increase in right ventricular wall thickness.    Objective: Vitals:   04/12/19 0717 04/12/19 0841 04/12/19 0919 04/12/19 1046  BP: (!) 160/88  (!) 147/77   Pulse: 77   79  Resp: 18     Temp: 97.7 F (36.5 C)     TempSrc: Oral     SpO2:  95%    Weight:      Height:        Intake/Output Summary (Last 24 hours) at 04/12/2019 1203 Last data filed at 04/12/2019 1012 Gross per 24 hour  Intake 3955.91 ml  Output 2025 ml  Net 1930.91 ml   Filed Weights   04/08/19 0327 04/11/19 0452 04/12/19 0529  Weight: 64.6 kg 67.8 kg 67.6 kg    BP (!) 147/77   Pulse 79   Temp 97.7 F (36.5 C) (Oral)   Resp 18   Ht 6\' 3"  (1.905 m)   Wt 67.6 kg   SpO2 95%   BMI 18.63 kg/m    Physical Exam  Constitution:  Alert, cooperative, no distress, tremors has improved Psychiatric: Stable mood awake alert x3 HEENT: Normocephalic, PERRL, otherwise with in Normal limits  Chest:Chest  symmetric Cardio vascular:  S1/S2, RRR, No murmure, No Rubs or Gallops  pulmonary: Clear to auscultation bilaterally, respirations unlabored, negative wheezes / crackles Abdomen: Soft, non-tender, non-distended, bowel sounds,no masses, no organomegaly Muscular skeletal:  Generalized weakness is noted Limited exam - in bed, able to move all 4 extremities, Normal strength,  Neuro: CNII-XII intact. , normal motor and sensation, reflexes intact  Extremities: No pitting edema lower extremities, +2 pulses  Skin: Dry, warm to touch, negative for any Rashes, No open wounds Wounds: per nursing documentation       Data Reviewed: I have personally reviewed following labs and imaging studies  CBC: Recent Labs  Lab 04/08/19 0409 04/09/19 0440 04/10/19 0754 04/11/19 0317 04/12/19 0300  WBC 10.0 7.5 7.3 6.8 6.7  NEUTROABS 7.3 5.3 4.5 4.2 4.4  HGB 15.6 13.1 12.6* 12.7* 12.0*  HCT 43.5 38.3* 35.6* 37.1* 35.5*  MCV 103.3* 106.7* 103.5* 105.7* 107.3*  PLT 191 210 188 213 0000000   Basic Metabolic Panel: Recent Labs  Lab 04/07/19 0405 04/08/19 0409 04/08/19 0939 04/09/19 0440 04/11/19 0935 04/11/19 1231 04/12/19 0300  NA 138 140  --  141 142  --  141  K 4.1 3.7  --  2.6* 3.0*  --  3.3*  CL 107 111  --  108 115*  --  114*  CO2 18* 17*  --  23 19*  --  20*  GLUCOSE 118* 80  --  100* 129*  --  110*  BUN 9 <5*  --  <5* 5*  --  7*  CREATININE 0.75 0.74  --  0.76 0.83  --  0.72  CALCIUM 8.5* 8.0*  --  8.0* 7.9*  --  7.7*  MG 1.6*  --  1.5* 1.5*  --  1.4*  --    GFR: Estimated Creatinine Clearance: 86.8 mL/min (by C-G formula based on SCr of 0.72 mg/dL). Liver Function Tests: Recent Labs  Lab 04/11/19 0935  AST 37  ALT 26  ALKPHOS 56  BILITOT 0.5  PROT 5.2*  ALBUMIN 2.5*   No results for input(s): LIPASE, AMYLASE in the last 168 hours. No results for input(s): AMMONIA in the last 168 hours. Coagulation Profile: No results for input(s): INR, PROTIME in the last 168 hours.  CardiaSepsis Labs: No results for input(s): PROCALCITON, LATICACIDVEN in the last 168 hours.  Recent Results (from the past 240 hour(s))  SARS CORONAVIRUS 2 (TAT 6-24 HRS) Nasopharyngeal Nasopharyngeal Swab     Status: None   Collection Time: 04/03/19  6:35 AM   Specimen: Nasopharyngeal Swab  Result Value Ref Range Status   SARS Coronavirus 2 NEGATIVE NEGATIVE Final    Comment: (NOTE) SARS-CoV-2 target nucleic acids are NOT DETECTED. The SARS-CoV-2 RNA is generally detectable in upper and lower respiratory specimens during the acute phase of infection. Negative results do not preclude SARS-CoN. [address] [place], [place] [address]   Blood Culture (routine x 2)     Status: None   Collection Time: 04/04/19  7:00 PM   Specimen: BLOOD  Result Value Ref Range Status   Specimen Description BLOOD BLOOD LEFT HAND  Final   Special Requests AEROBIC BOTTLE ONLY Blood Culture adequate volume  Final   Culture   Final    NO GROWTH 5 DAYS Performed at [facility], [place] [address], [place], [place] [address]    Report Status 04/10/2019 FINAL  Final  Blood Culture (routine x 2)     Status: None   Collection Time: 04/04/19  8:30 PM   Specimen: BLOOD LEFT ARM  Result Value Ref Range Status   Specimen Description BLOOD LEFT ARM  Final   Special Requests   Final    BOTTLES DRAWN AEROBIC AND ANAEROBIC Blood Culture results may not be optimal due to an inadequate volume of blood received in culture bottles   Culture   Final    NO GROWTH 5 DAYS Performed at [facility], [place] [address], [place], [place] [address]    Report Status 04/09/2019 FINAL  Final  SARS CORONAVIRUS 2 (TAT 6-24 HRS) Nasopharyngeal Nasopharyngeal Swab     Status: None   Collection Time: 04/04/19 10:27 PM   Specimen: Nasopharyngeal Swab  Result Value Ref Range Status   SARS Coronavirus 2 NEGATIVE NEGATIVE Final    Comment: (NOTE) SARS-CoV-2 target nucleic acids are NOT DETECTED. The SARS-CoV-2 RNA is generally  detectable in upper and lower respiratory specimens during the acute phase of infection. Negative results do not preclude SA  Urine culture     Status: Abnormal   Collection Time: 04/05/19  2:36 AM   Specimen: In/Out Cath Urine  Result Value Ref Range Status   Specimen Description IN/OUT CATH URINE  Final   Special Requests   Final    NONE Performed at [facility], [place] [address] [place], [place] [address]    Culture >=100,000 COLONIES/mL KLEBSIELLA PNEUMONIAE (A)  Final   Report Status 04/08/2019 FINAL  Final   Organism ID, Bacteria KLEBSIELLA PNEUMONIAE (A)  Final      Susceptibility   Klebsiella pneumoniae - MIC*  AMPICILLIN >=32 RESISTANT Resistant     CEFAZOLIN <=4 SENSITIVE Sensitive     CEFTRIAXONE <=1 SENSITIVE Sensitive     CIPROFLOXACIN <=0.25 SENSITIVE Sensitive     GENTAMICIN <=1 SENSITIVE Sensitive     IMIPENEM <=0.25 SENSITIVE Sensitive     NITROFURANTOIN 64 INTERMEDIATE Intermediate     TRIMETH/SULFA <=20 SENSITIVE Sensitive     AMPICILLIN/SULBACTAM 16 INTERMEDIATE Intermediate     PIP/TAZO <=4 SENSITIVE Sensitive     Extended ESBL NEGATIVE Sensitive     * >=100,000 COLONIES/mL KLEBSIELLA PNEUMONIAE      Radiology Studies: Dg Chest 2 View  Result Date: 04/12/2019 CLINICAL DATA:  Chest pain at rest. EXAM: CHEST - 2 VIEW COMPARISON:  04/07/2019 FINDINGS: Sternotomy wires unchanged. Lungs are adequately inflated and demonstrate bibasilar opacification compatible with moderate size bilateral layering pleural effusions likely with associated bibasilar atelectasis. Infection in the lung bases is possible. Cardiomediastinal silhouette is unchanged. Multiple stable thoracic spine compression fracture. IMPRESSION: Worsening moderate size bilateral pleural effusions likely with associated basilar atelectasis. Infection in the lung bases is possible. Multiple stable thoracic spine compression fractures. Electronically Signed   By: Marin Olp M.D.   On: 04/12/2019  12:00    Scheduled Meds: . alteplase  2 mg Intracatheter Once  . aspirin  324 mg Oral Once  . aspirin EC  81 mg Oral Daily  . atorvastatin  40 mg Oral q1800  . ciprofloxacin  500 mg Oral BID  . folic acid  1 mg Oral Daily  . lisinopril  40 mg Oral Daily  . mouth rinse  15 mL Mouth Rinse BID  . metoprolol tartrate  25 mg Oral BID  . mometasone-formoterol  2 puff Inhalation BID  . multivitamin with minerals  1 tablet Oral Daily  . nicotine  21 mg Transdermal Daily  . thiamine  100 mg Oral Daily   Or  . thiamine  100 mg Intravenous Daily  . umeclidinium bromide  1 puff Inhalation Daily   Continuous Infusions:    LOS: 7 days   Time spent: 26 minutes   Deatra James, MD [facility]  04/12/2019, 12:03 PM   To contact the attending provider between 7A-7P or the covering provider during after hours 7P-7A, please log into the web site [email or web address] and use password [email or web address].

## 2019-04-12 NOTE — Care Management Important Message (Signed)
Important Message  Patient Details  Name: LAWRENCE JEUNG MRN: [id number] Date of Birth: 03-Jan-1951   Medicare Important Message Given:  Yes     [name] 04/12/2019, 2:55 PM

## 2019-04-13 ENCOUNTER — Inpatient Hospital Stay (HOSPITAL_COMMUNITY): Payer: Medicare Other

## 2019-04-13 DIAGNOSIS — J90 Pleural effusion, not elsewhere classified: Secondary | ICD-10-CM

## 2019-04-13 DIAGNOSIS — F10239 Alcohol dependence with withdrawal, unspecified: Secondary | ICD-10-CM

## 2019-04-13 DIAGNOSIS — R079 Chest pain, unspecified: Secondary | ICD-10-CM

## 2019-04-13 LAB — GRAM STAIN

## 2019-04-13 LAB — BASIC METABOLIC PANEL
Anion gap: 8 (ref 5–15)
BUN: 5 mg/dL — ABNORMAL LOW (ref 8–23)
CO2: 20 mmol/L — ABNORMAL LOW (ref 22–32)
Calcium: 8.1 mg/dL — ABNORMAL LOW (ref 8.9–10.3)
Chloride: 113 mmol/L — ABNORMAL HIGH (ref 98–111)
Creatinine, Ser: 0.73 mg/dL (ref 0.61–1.24)
GFR calc Af Amer: >60 mL/min (ref >60)
GFR calc non Af Amer: >60 mL/min (ref >60)
Glucose, Bld: 93 mg/dL (ref 70–99)
Potassium: 3.5 mmol/L (ref 3.5–5.1)
Sodium: 141 mmol/L (ref 135–145)

## 2019-04-13 LAB — CBC WITH DIFFERENTIAL/PLATELET
Abs Immature Granulocytes: 0.04 10*3/uL (ref 0.00–0.07)
Basophils Absolute: 0.1 10*3/uL (ref 0.0–0.1)
Basophils Relative: 1 %
Eosinophils Absolute: 0.2 10*3/uL (ref 0.0–0.5)
Eosinophils Relative: 3 %
HCT: 37.2 % — ABNORMAL LOW (ref 39.0–52.0)
Hemoglobin: 12.4 g/dL — ABNORMAL LOW (ref 13.0–17.0)
Immature Granulocytes: 1 %
Lymphocytes Relative: 21 %
Lymphs Abs: 1.7 10*3/uL (ref 0.7–4.0)
MCH: 36.2 pg — ABNORMAL HIGH (ref 26.0–34.0)
MCHC: 33.3 g/dL (ref 30.0–36.0)
MCV: 108.5 fL — ABNORMAL HIGH (ref 80.0–100.0)
Monocytes Absolute: 1.2 10*3/uL — ABNORMAL HIGH (ref 0.1–1.0)
Monocytes Relative: 14 %
Neutro Abs: 5.1 10*3/uL (ref 1.7–7.7)
Neutrophils Relative %: 60 %
Platelets: 261 10*3/uL (ref 150–400)
RBC: 3.43 MIL/uL — ABNORMAL LOW (ref 4.22–5.81)
RDW: 12.9 % (ref 11.5–15.5)
WBC: 8.4 10*3/uL (ref 4.0–10.5)
nRBC: 0 % (ref 0.0–0.2)

## 2019-04-13 LAB — LACTATE DEHYDROGENASE, PLEURAL OR PERITONEAL FLUID: LD, Fluid: 62 U/L — ABNORMAL HIGH (ref 3–23)

## 2019-04-13 LAB — BODY FLUID CELL COUNT WITH DIFFERENTIAL
Eos, Fluid: 0 %
Lymphs, Fluid: 80 %
Monocyte-Macrophage-Serous Fluid: 13 % — ABNORMAL LOW (ref 50–90)
Neutrophil Count, Fluid: 7 % (ref 0–25)
Total Nucleated Cell Count, Fluid: 380 cu mm (ref 0–1000)

## 2019-04-13 LAB — GLUCOSE, PLEURAL OR PERITONEAL FLUID: Glucose, Fluid: 128 mg/dL

## 2019-04-13 MED ORDER — LIDOCAINE HCL (PF) 1 % IJ SOLN
INTRAMUSCULAR | Status: AC
  Filled 2019-04-13: qty 30

## 2019-04-13 MED ORDER — ADULT MULTIVITAMIN W/MINERALS CH: 1.0000 | ORAL_TABLET | Freq: Every day | ORAL | Status: DC

## 2019-04-13 MED ORDER — FOLIC ACID 1 MG PO TABS: 1.0000 mg | ORAL_TABLET | Freq: Every day | ORAL | 0 refills | Status: DC

## 2019-04-13 MED ORDER — LISINOPRIL 40 MG PO TABS: 40.0000 mg | ORAL_TABLET | Freq: Every day | ORAL | 0 refills | Status: DC

## 2019-04-13 MED ORDER — ATORVASTATIN CALCIUM 40 MG PO TABS: 40.0000 mg | ORAL_TABLET | Freq: Every day | ORAL | 0 refills | Status: DC

## 2019-04-13 MED ORDER — METOPROLOL TARTRATE 25 MG PO TABS: 25.0000 mg | ORAL_TABLET | Freq: Two times a day (BID) | ORAL | 0 refills | Status: DC

## 2019-04-13 MED ORDER — THIAMINE HCL 100 MG PO TABS: 100.0000 mg | ORAL_TABLET | Freq: Every day | ORAL | 0 refills | Status: DC

## 2019-04-13 MED ORDER — CHLORDIAZEPOXIDE HCL 25 MG PO CAPS: ORAL_CAPSULE | ORAL | 0 refills | Status: DC

## 2019-04-13 MED ORDER — CYANOCOBALAMIN 1000 MCG PO TABS: 1000.0000 ug | ORAL_TABLET | Freq: Every day | ORAL | 0 refills | Status: DC

## 2019-04-13 MED ORDER — ALBUTEROL SULFATE HFA 108 (90 BASE) MCG/ACT IN AERS: 2.0000 | INHALATION_SPRAY | Freq: Four times a day (QID) | RESPIRATORY_TRACT | 0 refills | Status: DC | PRN

## 2019-04-13 MED ORDER — FUROSEMIDE 10 MG/ML IJ SOLN
80.0000 mg | Freq: Once | INTRAMUSCULAR | Status: AC
  Administered 2019-04-13: 80 mg via INTRAVENOUS
  Filled 2019-04-13: qty 8

## 2019-04-13 MED ORDER — MOMETASONE FURO-FORMOTEROL FUM 100-5 MCG/ACT IN AERO: 2.0000 | INHALATION_SPRAY | Freq: Two times a day (BID) | RESPIRATORY_TRACT | 0 refills | Status: DC

## 2019-04-13 MED ORDER — ASPIRIN EC 81 MG PO TBEC: 81.0000 mg | DELAYED_RELEASE_TABLET | Freq: Every day | ORAL | 0 refills | Status: DC

## 2019-04-13 MED ORDER — SPIRIVA HANDIHALER 18 MCG IN CAPS: 18.0000 ug | ORAL_CAPSULE | Freq: Every day | RESPIRATORY_TRACT | 0 refills | Status: DC

## 2019-04-13 NOTE — Discharge Summary (Signed)
Physician Discharge Summary  [name] Pace [id number] DOB: Jan 04, 1951 DOA: 04/04/2019  PCP: Patient, No Pcp Per  Admit date: 04/04/2019 Discharge date: 04/13/2019  Admitted From: Home Disposition: Home.  Patient refused SNF  Recommendations for Outpatient Follow-up:  1. Follow up with PCP in 1 week with repeat CBC/BMP 2. Abstain from alcohol 3. Follow up in ED if symptoms worsen or new appear   Home Health: No.  Patient refused home health PT Equipment/Devices: None  Discharge Condition: Stable CODE STATUS: Full Diet recommendation: Heart healthy  Brief/Interim Summary: 68 year old male with history of CAD status post CABG, polysubstance abuse, prostate cancer presented with chest pain.  Cardiology was consulted.  ACS was ruled out.  Cardiology signed off.  Patient developed alcohol withdrawal which was treated with CIWA protocol and Librium.  He was also started on ciprofloxacin for urinary tract infection.  He was found to have bilateral pleural effusion for which he underwent thoracentesis on 04/13/2019.  He is currently stable for discharge.  He will need to be compliant with outpatient follow-up and abstain from alcohol.  Discharge home today.  Discharge Diagnoses:   Chest pain in a patient with history of CAD and history of CABG -Evaluated by cardiology who believe is noncardiac in nature.  No EKG changes were noted.  Outpatient follow-up with cardiology.  Cardiology has signed off. -Echo showed EF of 55% -Continue aspirin and metoprolol on discharge  Alcohol abuse with withdrawal -Treated with CIWA protocol. -Mental status improving.  Discharge on Librium 25 mg twice a day for 1 day, 25 mg once a day for 1 day then discontinue.  Continue multivitamin, thiamine and folic acid -Abstain from alcohol  Urinary tract infection with Proteus -Treated with ciprofloxacin: Today's day #9.  DC antibiotics on discharge  Bilateral pleural effusion -Questionable cause.  Patient is  afebrile without leukocytosis or worsening cough.  Doubt that patient has pneumonia.  Status post right-sided thoracentesis done by IR today. -Outpatient follow-up  Hypokalemia -Improved  Accelerated hypertension -Blood pressure improved.  Continue metoprolol and lisinopril  Generalized deconditioning -PT recommended SNF.  Patient refused SNF.  Patient refused home health PT as well.  Discharge Instructions  Discharge Instructions    Diet - low sodium heart healthy   Complete by: As directed    Increase activity slowly   Complete by: As directed      Allergies as of 04/13/2019      Reactions   Penicillins Anaphylaxis   Did it involve swelling of the face/tongue/throat, SOB, or low BP? Yes Did it involve sudden or severe rash/hives, skin peeling, or any reaction on the inside of your mouth or nose? No Did you need to seek medical attention at a hospital or doctor's office? No When did it last happen?childhood If all above answers are "NO", may proceed with cephalosporin use.      Medication List    STOP taking these medications   fluticasone 50 MCG/ACT nasal spray Commonly known as: FLONASE   hydrOXYzine 25 MG tablet Commonly known as: ATARAX/VISTARIL   lidocaine 5 % Commonly known as: LIDODERM   loratadine 10 MG tablet Commonly known as: CLARITIN   nicotine 14 mg/24hr patch Commonly known as: NICODERM CQ - dosed in mg/24 hours   OLANZapine 2.5 MG tablet Commonly known as: ZYPREXA   OLANZapine 5 MG tablet Commonly known as: ZYPREXA   pantoprazole 40 MG tablet Commonly known as: PROTONIX   polyethylene glycol 17 g packet Commonly known as: MIRALAX / GLYCOLAX  potassium chloride 10 MEQ tablet Commonly known as: KLOR-CON   predniSONE 20 MG tablet Commonly known as: DELTASONE   traMADol 50 MG tablet Commonly known as: ULTRAM     TAKE these medications   albuterol 108 (90 Base) MCG/ACT inhaler Commonly known as: VENTOLIN HFA Inhale 2 puffs  into the lungs every 6 (six) hours as needed for wheezing or shortness of breath. Use 2 puffs 3 times daily x5 days, then every 6 hours as needed.   aspirin EC 81 MG tablet Take 1 tablet (81 mg total) by mouth daily.   atorvastatin 40 MG tablet Commonly known as: LIPITOR Take 1 tablet (40 mg total) by mouth daily at 6 PM.   chlordiazePOXIDE 25 MG capsule Commonly known as: LIBRIUM 25 mg twice daily for 1 day then 25 mg daily for 1 day then discontinue   cyanocobalamin 1000 MCG tablet Take 1 tablet (1,000 mcg total) by mouth daily.   folic acid 1 MG tablet Commonly known as: FOLVITE Take 1 tablet (1 mg total) by mouth daily.   lisinopril 40 MG tablet Commonly known as: ZESTRIL Take 1 tablet (40 mg total) by mouth daily. Start taking on: April 14, 2019   metoprolol tartrate 25 MG tablet Commonly known as: LOPRESSOR Take 1 tablet (25 mg total) by mouth 2 (two) times daily.   mometasone-formoterol 100-5 MCG/ACT Aero Commonly known as: DULERA Inhale 2 puffs into the lungs 2 (two) times daily.   multivitamin with minerals Tabs tablet Take 1 tablet by mouth daily.   Spiriva HandiHaler 18 MCG inhalation capsule Generic drug: tiotropium Place 1 capsule (18 mcg total) into inhaler and inhale daily.   thiamine 100 MG tablet Take 1 tablet (100 mg total) by mouth daily. Start taking on: April 14, 2019      Follow-up Information    Turner, Eber Hong, MD. Schedule an appointment as soon as possible for a visit in 1 week(s).   Specialty: Cardiology Contact information: [id number]  [place] [address] [phone]          Allergies  Allergen Reactions  . Penicillins Anaphylaxis    Did it involve swelling of the face/tongue/throat, SOB, or low BP? Yes Did it involve sudden or severe rash/hives, skin peeling, or any reaction on the inside of your mouth or nose? No Did you need to seek medical attention at a hospital or doctor's office? No When did it  last happen?childhood If all above answers are "NO", may proceed with cephalosporin use.     Consultations:  Cardiology   Procedures/Studies: Dg Chest 1 View  Result Date: 04/13/2019 CLINICAL DATA:  68 year old male with a history of thoracentesis EXAM: CHEST  1 VIEW COMPARISON:  04/12/2019 FINDINGS: Cardiomediastinal silhouette unchanged in size and contour. Surgical changes of median sternotomy and CABG. Vascular stent in the region the branch vessels. Improved pleural fluid on the right with no visualized pneumothorax. Right-sided rib fractures, incompletely healed/remodeled. Opacity at the left lung base persists with blunting of left costophrenic angle. No new confluent airspace disease IMPRESSION: The decreased right pleural fluid status post thoracentesis with no pneumothorax. Similar appearance of ill-defined opacity at the left lung base likely a combination of pleural fluid and atelectasis/consolidation. Surgical changes of median sternotomy and CABG. Electronically Signed   By: Corrie Mckusick D.O.   On: 04/13/2019 12:02   Dg Chest 2 View  Result Date: 04/12/2019 CLINICAL DATA:  Chest pain at rest. EXAM: CHEST - 2 VIEW COMPARISON:  04/07/2019  FINDINGS: Sternotomy wires unchanged. Lungs are adequately inflated and demonstrate bibasilar opacification compatible with moderate size bilateral layering pleural effusions likely with associated bibasilar atelectasis. Infection in the lung bases is possible. Cardiomediastinal silhouette is unchanged. Multiple stable thoracic spine compression fracture. IMPRESSION: Worsening moderate size bilateral pleural effusions likely with associated basilar atelectasis. Infection in the lung bases is possible. Multiple stable thoracic spine compression fractures. Electronically Signed   By: Marin Olp M.D.   On: 04/12/2019 12:00   Dg Chest 2 View  Result Date: 04/04/2019 CLINICAL DATA:  68 year old male with chest pain. EXAM: CHEST - 2 VIEW  COMPARISON:  Chest radiograph dated 04/03/2019 FINDINGS: Shallow inspiration with minimal bibasilar atelectasis. No focal consolidation, pleural effusion, pneumothorax. Right pleural thickening and density at the right costophrenic angle, likely scarring related to prior rib fractures. Mild cardiomegaly. Median sternotomy wires and CABG vascular clips. Atherosclerotic calcification of the aorta. IMPRESSION: 1. No active cardiopulmonary disease.  No interval change. 2. Mild cardiomegaly. Electronically Signed   By: Anner Crete M.D.   On: 04/04/2019 19:22   Dg Thoracic Spine 2 View  Result Date: 04/05/2019 CLINICAL DATA:  Back pain. EXAM: THORACIC SPINE 2 VIEWS COMPARISON:  Chest x-ray 04/03/2019. MRI T-spine 11/23/2018. T-spine series 11/23/2018. FINDINGS: Diffuse osteopenia and degenerative change. Stable mid and lower thoracic vertebral body compression fractures are noted. No acute abnormality. Postsurgical changes in the chest. Vascular stent noted over the left upper chest. IMPRESSION: Diffuse osteopenia and degenerative change. Stable mid and lower thoracic vertebral body compression fractures are noted. No acute abnormality identified. Electronically Signed   By: Marcello Moores  Register   On: 04/05/2019 05:53   Ct Head Wo Contrast  Result Date: 04/04/2019 CLINICAL DATA:  Altered level of consciousness with nausea and dizziness EXAM: CT HEAD WITHOUT CONTRAST TECHNIQUE: Contiguous axial images were obtained from the base of the skull through the vertex without intravenous contrast. COMPARISON:  11/23/2018 FINDINGS: Brain: Right basal ganglia lacunar infarct is again seen. Mild atrophic changes and chronic ischemic changes are noted. Stable remote right thalamic infarct is noted. No findings to suggest acute hemorrhage, acute infarction or space-occupying mass lesion are noted. Vascular: No hyperdense vessel or unexpected calcification. Skull: Normal. Negative for fracture or focal lesion. Sinuses/Orbits:  No acute finding. Other: None IMPRESSION: Chronic lacunar infarcts as described. No acute abnormality is noted. Stable atrophy and white matter ischemic change. Electronically Signed   By: Inez Catalina M.D.   On: 04/04/2019 21:16   Ct Abdomen Pelvis W Contrast  Result Date: 04/04/2019 CLINICAL DATA:  Nausea and vomiting EXAM: CT ABDOMEN AND PELVIS WITH CONTRAST TECHNIQUE: Multidetector CT imaging of the abdomen and pelvis was performed using the standard protocol following bolus administration of intravenous contrast. CONTRAST:  184mL OMNIPAQUE IOHEXOL 300 MG/ML  SOLN COMPARISON:  CT 08/19/2018 FINDINGS: Lower chest: Lung bases demonstrate scarring or atelectasis at the posterior lung bases. Emphysema. No acute consolidation or pleural effusion. Borderline heart size. Coronary vascular calcification Hepatobiliary: No focal liver abnormality is seen. Status post cholecystectomy. No biliary dilatation. Pancreas: Unremarkable. No pancreatic ductal dilatation or surrounding inflammatory changes. Spleen: Normal in size without focal abnormality. Adrenals/Urinary Tract: Adrenal glands are unremarkable. Kidneys are normal, without renal calculi, focal lesion, or hydronephrosis. Bladder is slightly thick walled. Stomach/Bowel: The stomach is nonenlarged. No dilated small bowel. Negative for colon wall thickening. Negative appendix. Vascular/Lymphatic: Heavily calcified aorta. No aneurysm. No significantly enlarged lymph nodes. Reproductive: No masses.  Status post prostatectomy. Other: Negative for free air or free fluid.  Musculoskeletal: No acute or suspicious osseous abnormality. Intramedullary rodding left femur. Stable lucency at the tip of the femoral component without femoral head breech or collapse. IMPRESSION: 1. No CT evidence for acute intra-abdominal or pelvic abnormality. 2. Slightly thick-walled appearance of the urinary bladder, nonspecific, questionable for cystitis 3. Emphysema Electronically Signed    By: Donavan Foil M.D.   On: 04/04/2019 21:21   Dg Chest Port 1 View  Result Date: 04/07/2019 CLINICAL DATA:  Tachypnea.  Chest pain. EXAM: PORTABLE CHEST 1 VIEW COMPARISON:  One-view chest x-ray 04/04/2019 FINDINGS: Heart is enlarged. Aeration is slightly improved. Mild pulmonary vascular congestion persists. Lung volumes are low. No focal airspace disease is present. Healed right-sided rib fractures are noted. Asymmetric degenerative changes are again noted in the right shoulder. IMPRESSION: 1. Cardiomegaly without failure. 2. Low lung volumes. Electronically Signed   By: San Morelle M.D.   On: 04/07/2019 10:36   Dg Chest Portable 1 View  Result Date: 04/03/2019 CLINICAL DATA:  68 year old male with chest and back pain. EXAM: PORTABLE CHEST 1 VIEW COMPARISON:  Chest radiograph dated 12/01/2018 and CT dated 12/01/2018 FINDINGS: There is background of emphysema. There is minimal bibasilar atelectasis and right costophrenic density similar to prior radiograph, likely chronic changes and scarring. No lobar consolidation, pleural effusion, or pneumothorax. Thickened appearance of the right lateral pleural surface, likely chronic and scarring secondary to right rib fractures. No pneumothorax identified. Lucent line in the right upper lobe most consistent with skin fold. Stable cardiac silhouette. Median sternotomy wires and CABG vascular clips and left brachiocephalic vascular stent. Multiple old healed right rib fractures. Slightly displaced appearance of several right lateral ribs may be chronic although acute fractures not excluded on this single view radiograph. Correlation with clinical exam recommended. If there is clinical concern for an acute fracture dedicated rib series recommended. IMPRESSION: 1. No acute cardiopulmonary process. 2. Slightly displaced appearance of several right lateral ribs may be chronic although acute fractures not excluded on this single view. Dedicated rib series may  provide better evaluation. Electronically Signed   By: Anner Crete M.D.   On: 04/03/2019 01:27    Echo IMPRESSIONS    1. The left ventricle has a visually estimated ejection fraction of 55%. The cavity size was mildly dilated. Left ventricular diastolic Doppler parameters are consistent with pseudonormalization. Basal inferior hypokinesis.  2. Left atrial size was moderately dilated.  3. No evidence of mitral valve stenosis. Trivial mitral regurgitation.  4. The aortic valve is tricuspid. Mild calcification of the aortic valve. No stenosis of the aortic valve.  5. There is mild dilatation of the aortic root measuring 41 mm.  6. Normal IVC size. No complete TR doppler jet so unable to estimate PA systolic pressure.  7. The right ventricle has normal systolic function. The cavity was normal. There is no increase in right ventricular wall thickness.   Subjective: Patient seen and examined at bedside.  He is a poor historian.  Feels that he is okay to go home today.  Denies overnight fever or vomiting.  Discharge Exam: Vitals:   04/13/19 1113 04/13/19 1118  BP: (!) 118/59 (!) 122/56  Pulse:    Resp:    Temp:    SpO2:      General: Pt is alert, awake, not in acute distress.  Very poor historian.  Looks thinly built and chronically ill. Cardiovascular: rate controlled, S1/S2 + Respiratory: bilateral decreased breath sounds at bases with some scattered crackles Abdominal: Soft, NT, ND, bowel  sounds + Extremities: Trace lower extremity edema, no cyanosis    The results of significant diagnostics from this hospitalization (including imaging, microbiology, ancillary and laboratory) are listed below for reference.     Microbiology: Recent Results (from the past 240 hour(s))  Blood Culture (routine x 2)     Status: None   Collection Time: 04/04/19  7:00 PM   Specimen: BLOOD  Result Value Ref Range Status   Specimen Description BLOOD BLOOD LEFT HAND  Final   Special Requests  AEROBIC BOTTLE ONLY Blood Culture adequate volume  Final   Culture   Final    NO GROWTH 5 DAYS Performed at [facility], [address], [place], [place] [address]    Report Status 04/10/2019 FINAL  Final  Blood Culture (routine x 2)     Status: None   Collection Time: 04/04/19  8:30 PM   Specimen: BLOOD LEFT ARM  Result Value Ref Range Status   Specimen Description BLOOD LEFT ARM  Final   Special Requests   Final    BOTTLES DRAWN AEROBIC AND ANAEROBIC Blood Culture results may not be optimal due to an inadequate volume of blood received in culture bottles   Culture   Final    NO GROWTH 5 DAYS Performed at [facility], [place] [address] [place], [place] [address]    Report Status 04/09/2019 FINAL  Final  SARS CORONAVIRUS 2 (TAT 6-24 HRS) Nasopharyngeal Nasopharyngeal Swab     Status: None   Collection Time: 04/04/19 10:27 PM   Specimen: Nasopharyngeal Swab  Result Value Ref Range Status   SARS Coronavirus 2 NEGATIVE NEGATIVE Final    Comment: (NOTE) SARS-CoV-2 target nucleic acids are NOT DETECTED. The SARS-CoV-2 RNA is generally detectable in upper and lower respiratory specimens during the acute phase of infection. Negative results do not preclude SARS-CoV-2 infection, do not rule out co-infections with other pathogens, and should not be used as the sole basis for treatment or other patient management decisions. Negative results must be combined with clinical observations, patient history, and epidemiological information. The expected result is Negative. Fact Sheet for Patients: SugarRoll.be Fact Sheet for Healthcare Providers: https://www.woods-mathews.com/ This test is not yet approved or cleared by the [place] FDA and  has been authorized for detection and/or diagnosis of SARS-CoV-2 by FDA under an Emergency Use Authorization (EUA). This EUA will remain  in effect (meaning this test can be used) for the duration of  the COVID-19 declaration under Section 56 4(b)(1) of the Act, 21 U.S.C. section 360bbb-3(b)(1), unless the authorization is terminated or revoked sooner. Performed at [facility], [place] [address] [place], [place] [address]   Urine culture     Status: Abnormal   Collection Time: 04/05/19  2:36 AM   Specimen: In/Out Cath Urine  Result Value Ref Range Status   Specimen Description IN/OUT CATH URINE  Final   Special Requests   Final    NONE Performed at [facility], [place] [address] [place], [place] [address]    Culture >=100,000 COLONIES/mL KLEBSIELLA PNEUMONIAE (A)  Final   Report Status 04/08/2019 FINAL  Final   Organism ID, Bacteria KLEBSIELLA PNEUMONIAE (A)  Final      Susceptibility   Klebsiella pneumoniae - MIC*    AMPICILLIN >=32 RESISTANT Resistant     CEFAZOLIN <=4 SENSITIVE Sensitive     CEFTRIAXONE <=1 SENSITIVE Sensitive     CIPROFLOXACIN <=0.25 SENSITIVE Sensitive     GENTAMICIN <=1 SENSITIVE  Sensitive     IMIPENEM <=0.25 SENSITIVE Sensitive     NITROFURANTOIN 64 INTERMEDIATE Intermediate     TRIMETH/SULFA <=20 SENSITIVE Sensitive     AMPICILLIN/SULBACTAM 16 INTERMEDIATE Intermediate     PIP/TAZO <=4 SENSITIVE Sensitive     Extended ESBL NEGATIVE Sensitive     * >=100,000 COLONIES/mL KLEBSIELLA PNEUMONIAE     Labs: BNP (last 3 results) Recent Labs    08/19/18 0415 12/01/18 0548  BNP 172.4* [id number]*   Basic Metabolic Panel: Recent Labs  Lab 04/07/19 0405 04/08/19 0409 04/08/19 0939 04/09/19 0440 04/11/19 0935 04/11/19 1231 04/12/19 0300 04/13/19 0334  NA 138 140  --  141 142  --  141 141  K 4.1 3.7  --  2.6* 3.0*  --  3.3* 3.5  CL 107 111  --  108 115*  --  114* 113*  CO2 18* 17*  --  23 19*  --  20* 20*  GLUCOSE 118* 80  --  100* 129*  --  110* 93  BUN 9 <5*  --  <5* 5*  --  7* 5*  CREATININE 0.75 0.74  --  0.76 0.83  --  0.72 0.73  CALCIUM 8.5* 8.0*  --  8.0* 7.9*  --  7.7* 8.1*  MG 1.6*  --  1.5* 1.5*  --  1.4*  --   --    Liver  Function Tests: Recent Labs  Lab 04/11/19 0935  AST 37  ALT 26  ALKPHOS 56  BILITOT 0.5  PROT 5.2*  ALBUMIN 2.5*   No results for input(s): LIPASE, AMYLASE in the last 168 hours. No results for input(s): AMMONIA in the last 168 hours. CBC: Recent Labs  Lab 04/09/19 0440 04/10/19 0754 04/11/19 0317 04/12/19 0300 04/13/19 0334  WBC 7.5 7.3 6.8 6.7 8.4  NEUTROABS 5.3 4.5 4.2 4.4 5.1  HGB 13.1 12.6* 12.7* 12.0* 12.4*  HCT 38.3* 35.6* 37.1* 35.5* 37.2*  MCV 106.7* 103.5* 105.7* 107.3* 108.5*  PLT 210 188 213 249 261   Cardiac Enzymes: No results for input(s): CKTOTAL, CKMB, CKMBINDEX, TROPONINI in the last 168 hours. BNP: Invalid input(s): POCBNP CBG: No results for input(s): GLUCAP in the last 168 hours. D-Dimer No results for input(s): DDIMER in the last 72 hours. Hgb A1c No results for input(s): HGBA1C in the last 72 hours. Lipid Profile No results for input(s): CHOL, HDL, LDLCALC, TRIG, CHOLHDL, LDLDIRECT in the last 72 hours. Thyroid function studies No results for input(s): TSH, T4TOTAL, T3FREE, THYROIDAB in the last 72 hours.  Invalid input(s): FREET3 Anemia work up No results for input(s): VITAMINB12, FOLATE, FERRITIN, TIBC, IRON, RETICCTPCT in the last 72 hours. Urinalysis    Component Value Date/Time   COLORURINE YELLOW 04/05/2019 0236   APPEARANCEUR CLEAR 04/05/2019 0236   LABSPEC 1.014 04/05/2019 0236   PHURINE 6.0 04/05/2019 0236   GLUCOSEU NEGATIVE 04/05/2019 0236   HGBUR NEGATIVE 04/05/2019 0236   BILIRUBINUR NEGATIVE 04/05/2019 0236   KETONESUR 5 (A) 04/05/2019 0236   PROTEINUR NEGATIVE 04/05/2019 0236   UROBILINOGEN 0.2 06/14/2014 2135   NITRITE POSITIVE (A) 04/05/2019 0236   LEUKOCYTESUR NEGATIVE 04/05/2019 0236   Sepsis Labs Invalid input(s): PROCALCITONIN,  WBC,  LACTICIDVEN Microbiology Recent Results (from the past 240 hour(s))  Blood Culture (routine x 2)     Status: None   Collection Time: 04/04/19  7:00 PM   Specimen: BLOOD   Result Value Ref Range Status   Specimen Description BLOOD BLOOD LEFT HAND  Final   Special Requests  AEROBIC BOTTLE ONLY Blood Culture adequate volume  Final   Culture   Final    NO GROWTH 5 DAYS Performed at [facility], [place] [address] [place], [place] [address]    Report Status 04/10/2019 FINAL  Final  Blood Culture (routine x 2)     Status: None   Collection Time: 04/04/19  8:30 PM   Specimen: BLOOD LEFT ARM  Result Value Ref Range Status   Specimen Description BLOOD LEFT ARM  Final   Special Requests   Final    BOTTLES DRAWN AEROBIC AND ANAEROBIC Blood Culture results may not be optimal due to an inadequate volume of blood received in culture bottles   Culture   Final    NO GROWTH 5 DAYS Performed at [facility], [place] [address] [place], [place] [address]    Report Status 04/09/2019 FINAL  Final  SARS CORONAVIRUS 2 (TAT 6-24 HRS) Nasopharyngeal Nasopharyngeal Swab     Status: None   Collection Time: 04/04/19 10:27 PM   Specimen: Nasopharyngeal Swab  Result Value Ref Range Status   SARS Coronavirus 2 NEGATIVE NEGATIVE Final    Comment: (NOTE) SARS-CoV-2 target nucleic acids are NOT DETECTED. The SARS-CoV-2 RNA is generally detectable in upper and lower respiratory specimens during the acute phase of infection. Negative results do not preclude SARS-CoV-2 infection, do not rule out co-infections with other pathogens, and should not be used as the sole basis for treatment or other patient management decisions. Negative results must be combined with clinical observations, patient history, and epidemiological information. The expected result is Negative. Fact Sheet for Patients: SugarRoll.be Fact Sheet for Healthcare Providers: https://www.woods-mathews.com/ This test is not yet approved or cleared by the [place] FDA and  has been authorized for detection and/or diagnosis of SARS-CoV-2 by FDA under an Emergency Use  Authorization (EUA). This EUA will remain  in effect (meaning this test can be used) for the duration of the COVID-19 declaration under Section 56 4(b)(1) of the Act, 21 U.S.C. section 360bbb-3(b)(1), unless the authorization is terminated or revoked sooner. Performed at [facility], [place] [address] [place], [place] [address]   Urine culture     Status: Abnormal   Collection Time: 04/05/19  2:36 AM   Specimen: In/Out Cath Urine  Result Value Ref Range Status   Specimen Description IN/OUT CATH URINE  Final   Special Requests   Final    NONE Performed at [facility], [place] [address], [place], [place] [address]    Culture >=100,000 COLONIES/mL KLEBSIELLA PNEUMONIAE (A)  Final   Report Status 04/08/2019 FINAL  Final   Organism ID, Bacteria KLEBSIELLA PNEUMONIAE (A)  Final      Susceptibility   Klebsiella pneumoniae - MIC*    AMPICILLIN >=32 RESISTANT Resistant     CEFAZOLIN <=4 SENSITIVE Sensitive     CEFTRIAXONE <=1 SENSITIVE Sensitive     CIPROFLOXACIN <=0.25 SENSITIVE Sensitive     GENTAMICIN <=1 SENSITIVE Sensitive     IMIPENEM <=0.25 SENSITIVE Sensitive     NITROFURANTOIN 64 INTERMEDIATE Intermediate     TRIMETH/SULFA <=20 SENSITIVE Sensitive     AMPICILLIN/SULBACTAM 16 INTERMEDIATE Intermediate     PIP/TAZO <=4 SENSITIVE Sensitive     Extended ESBL NEGATIVE Sensitive     * >=100,000 COLONIES/mL KLEBSIELLA PNEUMONIAE     Time coordinating discharge: 35 minutes  SIGNED:   Aline August, MD  [facility] 04/13/2019, 12:52 PM

## 2019-04-13 NOTE — Progress Notes (Signed)
Just spoke to patient's girlfriend Dawn at [phone]. She stated she had just got home from grocery store but is in for the night.  Calling [facility] for patient pick up.

## 2019-04-13 NOTE — Care Management (Addendum)
PTAR requested for patient as RN feels he is not stable enough to ambulate from cab and into house.  RN states his girlfriend is home to receive patient but is quarantined for COVID.  Patient has no other transportation.

## 2019-04-13 NOTE — Progress Notes (Signed)
PT Cancellation Note  Patient Details Name: [name] Pace MRN: [id number] DOB: 1951/03/24   Cancelled Treatment:    Reason Eval/Treat Not Completed: Patient declined, no reason specified. Pt not offering any explanation of why he did not want to participate with therapy at this time. Will continue to follow.    Thelma Comp 04/13/2019, 1:06 PM   Rolinda Roan, PT, DPT Acute Rehabilitation Services Pager: [phone] Office: [phone]

## 2019-04-14 ENCOUNTER — Emergency Department (HOSPITAL_COMMUNITY): Payer: Medicare Other

## 2019-04-14 ENCOUNTER — Other Ambulatory Visit: Payer: Self-pay

## 2019-04-14 ENCOUNTER — Emergency Department (HOSPITAL_COMMUNITY)
Admission: EM | Admit: 2019-04-14 | Discharge: 2019-04-14 | Disposition: A | Discharge status: Home / Self Care | Payer: Medicare Other | Attending: Emergency Medicine | Admitting: Emergency Medicine

## 2019-04-14 ENCOUNTER — Encounter (HOSPITAL_COMMUNITY): Payer: Self-pay | Admitting: *Deleted

## 2019-04-14 DIAGNOSIS — R079 Chest pain, unspecified: Secondary | ICD-10-CM | POA: Diagnosis not present

## 2019-04-14 DIAGNOSIS — R4781 Slurred speech: Secondary | ICD-10-CM | POA: Diagnosis not present

## 2019-04-14 DIAGNOSIS — M25521 Pain in right elbow: Secondary | ICD-10-CM | POA: Insufficient documentation

## 2019-04-14 DIAGNOSIS — F17210 Nicotine dependence, cigarettes, uncomplicated: Secondary | ICD-10-CM | POA: Insufficient documentation

## 2019-04-14 DIAGNOSIS — S8992XA Unspecified injury of left lower leg, initial encounter: Secondary | ICD-10-CM | POA: Diagnosis not present

## 2019-04-14 DIAGNOSIS — R600 Localized edema: Secondary | ICD-10-CM | POA: Diagnosis not present

## 2019-04-14 DIAGNOSIS — Y999 Unspecified external cause status: Secondary | ICD-10-CM | POA: Insufficient documentation

## 2019-04-14 DIAGNOSIS — J449 Chronic obstructive pulmonary disease, unspecified: Secondary | ICD-10-CM | POA: Insufficient documentation

## 2019-04-14 DIAGNOSIS — Z79899 Other long term (current) drug therapy: Secondary | ICD-10-CM | POA: Insufficient documentation

## 2019-04-14 DIAGNOSIS — W19XXXA Unspecified fall, initial encounter: Secondary | ICD-10-CM | POA: Insufficient documentation

## 2019-04-14 DIAGNOSIS — Y939 Activity, unspecified: Secondary | ICD-10-CM | POA: Insufficient documentation

## 2019-04-14 DIAGNOSIS — Z743 Need for continuous supervision: Secondary | ICD-10-CM | POA: Diagnosis not present

## 2019-04-14 DIAGNOSIS — I2510 Atherosclerotic heart disease of native coronary artery without angina pectoris: Secondary | ICD-10-CM | POA: Insufficient documentation

## 2019-04-14 DIAGNOSIS — S299XXA Unspecified injury of thorax, initial encounter: Secondary | ICD-10-CM | POA: Diagnosis not present

## 2019-04-14 DIAGNOSIS — R0789 Other chest pain: Secondary | ICD-10-CM | POA: Insufficient documentation

## 2019-04-14 DIAGNOSIS — S59901A Unspecified injury of right elbow, initial encounter: Secondary | ICD-10-CM | POA: Diagnosis not present

## 2019-04-14 DIAGNOSIS — R0602 Shortness of breath: Secondary | ICD-10-CM | POA: Diagnosis not present

## 2019-04-14 DIAGNOSIS — M25562 Pain in left knee: Secondary | ICD-10-CM | POA: Diagnosis not present

## 2019-04-14 DIAGNOSIS — Y929 Unspecified place or not applicable: Secondary | ICD-10-CM | POA: Diagnosis not present

## 2019-04-14 LAB — HEPATIC FUNCTION PANEL
ALT: UNDETERMINED U/L (ref 0–44)
AST: 38 U/L (ref 15–41)
Albumin: 2.8 g/dL — ABNORMAL LOW (ref 3.5–5.0)
Alkaline Phosphatase: 73 U/L (ref 38–126)
Bilirubin, Direct: UNDETERMINED mg/dL (ref 0.0–0.2)
Total Bilirubin: UNDETERMINED mg/dL (ref 0.3–1.2)
Total Protein: 5.9 g/dL — ABNORMAL LOW (ref 6.5–8.1)

## 2019-04-14 LAB — CBC
HCT: 43.4 % (ref 39.0–52.0)
Hemoglobin: 14.3 g/dL (ref 13.0–17.0)
MCH: 35.8 pg — ABNORMAL HIGH (ref 26.0–34.0)
MCHC: 32.9 g/dL (ref 30.0–36.0)
MCV: 108.8 fL — ABNORMAL HIGH (ref 80.0–100.0)
Platelets: 282 10*3/uL (ref 150–400)
RBC: 3.99 MIL/uL — ABNORMAL LOW (ref 4.22–5.81)
RDW: 12.8 % (ref 11.5–15.5)
WBC: 9.9 10*3/uL (ref 4.0–10.5)
nRBC: 0 % (ref 0.0–0.2)

## 2019-04-14 LAB — BASIC METABOLIC PANEL
Anion gap: 17 — ABNORMAL HIGH (ref 5–15)
BUN: 8 mg/dL (ref 8–23)
CO2: 15 mmol/L — ABNORMAL LOW (ref 22–32)
Calcium: 8.5 mg/dL — ABNORMAL LOW (ref 8.9–10.3)
Chloride: 108 mmol/L (ref 98–111)
Creatinine, Ser: 0.72 mg/dL (ref 0.61–1.24)
GFR calc Af Amer: >60 mL/min (ref >60)
GFR calc non Af Amer: >60 mL/min (ref >60)
Glucose, Bld: 92 mg/dL (ref 70–99)
Potassium: 3.5 mmol/L (ref 3.5–5.1)
Sodium: 140 mmol/L (ref 135–145)

## 2019-04-14 LAB — URINALYSIS, ROUTINE W REFLEX MICROSCOPIC
Bilirubin Urine: NEGATIVE
Glucose, UA: NEGATIVE mg/dL
Hgb urine dipstick: NEGATIVE
Ketones, ur: NEGATIVE mg/dL
Leukocytes,Ua: NEGATIVE
Nitrite: NEGATIVE
Protein, ur: NEGATIVE mg/dL
Specific Gravity, Urine: 1.01 (ref 1.005–1.030)
pH: 7 (ref 5.0–8.0)

## 2019-04-14 LAB — TROPONIN I (HIGH SENSITIVITY)
Troponin I (High Sensitivity): 15 ng/L (ref <18)
Troponin I (High Sensitivity): 11 ng/L (ref <18)

## 2019-04-14 LAB — LIPASE, BLOOD: Lipase: 16 U/L (ref 11–51)

## 2019-04-14 MED ORDER — LORAZEPAM 1 MG PO TABS
1.0000 mg | ORAL_TABLET | Freq: Once | ORAL | Status: AC
  Administered 2019-04-14: 1 mg via ORAL
  Filled 2019-04-14: qty 1

## 2019-04-14 MED ORDER — SODIUM CHLORIDE 0.9% FLUSH: 3.0000 mL | Freq: Once | INTRAVENOUS | Status: DC

## 2019-04-14 NOTE — Discharge Instructions (Signed)
Your work-up today showed no evidence of fracture or dislocation but we did see the severe arthritis disease in your knee.  I suspect that this caused her fall and is contribute to the worsened pain.  Given the tenderness, please use the crutches and knee immobilizer and follow-up with the orthopedics team you have seen before.  Your other lab work was overall reassuring and your chest x-ray did not show any reaccumulation of your pleural effusions and no new rib fractures.  Please follow-up with your PCP as well.  If any symptoms change or worsen, please return to the nearest emergency room.

## 2019-04-14 NOTE — ED Notes (Signed)
Pt given happy meal. 

## 2019-04-14 NOTE — ED Notes (Signed)
2 attempts at IV with no success. 

## 2019-04-14 NOTE — ED Notes (Signed)
IV team at bedside 

## 2019-04-14 NOTE — ED Triage Notes (Signed)
Pt here from home via [facility] for generalized weakness, chest pain and fall.  He was discharged from hospital yesterday.  He apparently had bil thoracentesis yesterday before he was discharged.  Pt was going out for a smoke and his L "problem knee" gave out.  Now c/o R elbow pain and bil chronic knee pain.  Also c/o bil anterior chest/rib pain.  Pt was on the ground for an hour before paramedics helped him up.

## 2019-04-14 NOTE — ED Notes (Signed)
Pt given taxi voucher

## 2019-04-14 NOTE — ED Notes (Signed)
Patient transported to X-ray 

## 2019-04-14 NOTE — ED Notes (Addendum)
Ortho paged and responded.

## 2019-04-14 NOTE — Progress Notes (Signed)
Orthopedic Tech Progress Note Patient Details:  [name] Pace 1950/11/13 [id number]  Ortho Devices Type of Ortho Device: Knee Immobilizer Ortho Device/Splint Location: LLE Ortho Device/Splint Interventions: Adjustment, Application, Ordered   Post Interventions Patient Tolerated: Ambulated well, Well Instructions Provided: Poper ambulation with device, Care of device, Adjustment of device   Janit Pagan 04/14/2019, 4:14 PM

## 2019-04-14 NOTE — ED Notes (Signed)
Pt not given crutches b/c he is not stable enough to use crutches.  However, he ambulated well with the walker.

## 2019-04-14 NOTE — ED Notes (Signed)
Pt requesting xanax.  MD notified.

## 2019-04-14 NOTE — ED Provider Notes (Signed)
[facility] EMERGENCY DEPARTMENT Provider Note   CSN: [id number] Arrival date & time: 04/14/19  [id number]     History   Chief Complaint Chief Complaint  Patient presents with   Weakness   Chest Pain    HPI [name] Pace is a 68 y.o. male.     The history is provided by the patient and medical records. No language interpreter was used.  Fall This is a recurrent problem. The current episode started 6 to 12 hours ago. The problem occurs rarely. The problem has been resolved. Associated symptoms include chest pain and shortness of breath. Pertinent negatives include no abdominal pain and no headaches. He has tried nothing for the symptoms. The treatment provided no relief.    Past Medical History:  Diagnosis Date   Alcohol abuse    Anxiety    Breast cancer ([place]) Left breast   Per patient diagnosed in early 23's.    Cocaine abuse ([place]) 08/19/2018   Coronary artery disease    High cholesterol    Left rib fracture 08/19/2018   Prostate cancer ([place])    per patient diagnosed in early 50's   Stroke [facility])     Patient Active Problem List   Diagnosis Date Noted   Elevated troponin    Severe alcohol use disorder (HCC)    Abnormal echocardiogram 12/02/2018   Compression fracture of T6 vertebra (HCC)    Compression fracture of T7 vertebra (HCC)    Compression fracture of T8 vertebra (HCC)    COPD exacerbation ([place]) 12/01/2018   T6 vertebral fracture ([place]) 12/01/2018   T7 vertebral fracture ([place]) 12/01/2018   T8 vertebral fracture ([place]) 12/01/2018   Cocaine abuse ([place]) 08/19/2018   Polysubstance abuse ([place]) 08/19/2018   Left rib fracture 08/19/2018   Potomania 08/19/2018   Alcohol abuse with intoxication ([place]) 08/19/2018   At risk for adverse drug event 06/12/2018   Palliative care encounter    Encephalopathy acute    Delirium tremens ([place]) 05/18/2018   Malnutrition of moderate degree 05/18/2018   Acute respiratory failure ([place])     Shock circulatory ([place])    Multiple rib fractures 05/14/2018   Pneumothorax, closed, traumatic, initial encounter 05/14/2018   Traumatic fracture of ribs with pneumothorax    Laceration of muscle(s) and tendon(s) of anterior muscle group at lower leg level, right leg, initial encounter 02/10/2016   Traumatic rupture of right anterior tibial tendon 02/10/2016   Hip fracture, left ([place]) 07/02/2014   Closed left hip fracture ([place]) 07/02/2014   Hip fracture ([place]) 07/02/2014   Intertrochanteric fracture of left femur ([place]) 07/02/2014   History of stroke    Preop examination    Subclavian artery stenosis, left ([place]) 06/15/2014   Chest pain 06/14/2014   CAP (community acquired pneumonia) 06/14/2014   Sepsis ([place]) 06/14/2014   Alcohol abuse 06/14/2014   CAD (coronary artery disease) 06/14/2014   Tobacco abuse 06/14/2014    Past Surgical History:  Procedure Laterality Date   CAROTID STENT     CORONARY ARTERY BYPASS GRAFT     FEMUR IM NAIL Left 07/02/2014   Procedure: INTRAMEDULLARY (IM) NAIL FEMORAL;  Surgeon: Elie Goody, MD;  Location: [facility];  Service: Orthopedics;  Laterality: Left;   I&D EXTREMITY Right 02/10/2016   Procedure: IRRIGATION AND DEBRIDEMENT EXTREMITY;  Surgeon: Dorna Leitz, MD;  Location: [facility];  Service: Orthopedics;  Laterality: Right;   TENDON REPAIR Right 02/10/2016   Procedure: Anterior TENDON REPAIR and wound exploration.;  Surgeon: Jenny Reichmann  Berenice Primas, MD;  Location: [employer];  Service: Orthopedics;  Laterality: Right;        Home Medications    Prior to Admission medications   Medication Sig Start Date End Date Taking? Authorizing Provider  albuterol (VENTOLIN HFA) 108 (90 Base) MCG/ACT inhaler Inhale 2 puffs into the lungs every 6 (six) hours as needed for wheezing or shortness of breath. Use 2 puffs 3 times daily x5 days, then every 6 hours as needed. 04/13/19   Aline August, MD  aspirin EC 81 MG tablet Take 1 tablet (81 mg total) by  mouth daily. 04/13/19   Aline August, MD  atorvastatin (LIPITOR) 40 MG tablet Take 1 tablet (40 mg total) by mouth daily at 6 PM. 04/13/19   Aline August, MD  chlordiazePOXIDE (LIBRIUM) 25 MG capsule 25 mg twice daily for 1 day then 25 mg daily for 1 day then discontinue 04/13/19   Aline August, MD  cyanocobalamin 1000 MCG tablet Take 1 tablet (1,000 mcg total) by mouth daily. 04/13/19   Aline August, MD  folic acid (FOLVITE) 1 MG tablet Take 1 tablet (1 mg total) by mouth daily. 04/13/19   Aline August, MD  lisinopril (ZESTRIL) 40 MG tablet Take 1 tablet (40 mg total) by mouth daily. 04/14/19   Aline August, MD  metoprolol tartrate (LOPRESSOR) 25 MG tablet Take 1 tablet (25 mg total) by mouth 2 (two) times daily. 04/13/19   Aline August, MD  mometasone-formoterol (DULERA) 100-5 MCG/ACT AERO Inhale 2 puffs into the lungs 2 (two) times daily. 04/13/19   Aline August, MD  Multiple Vitamin (MULTIVITAMIN WITH MINERALS) TABS tablet Take 1 tablet by mouth daily. 04/13/19   Aline August, MD  thiamine 100 MG tablet Take 1 tablet (100 mg total) by mouth daily. 04/14/19   Aline August, MD  tiotropium (SPIRIVA HANDIHALER) 18 MCG inhalation capsule Place 1 capsule (18 mcg total) into inhaler and inhale daily. 04/13/19 05/13/19  Aline August, MD    Family History Family History  Problem Relation Age of Onset   Dementia Mother     Social History Social History   Tobacco Use   Smoking status: Current Every Day Smoker    Packs/day: 1.00    Years: 42.00    Pack years: 42.00    Types: Cigarettes   Smokeless tobacco: Never Used  Substance Use Topics   Alcohol use: Yes    Comment: 4-5 beers a day   Drug use: No     Allergies   Penicillins   Review of Systems Review of Systems  Constitutional: Negative for chills, diaphoresis, fatigue and fever.  HENT: Negative for congestion.   Eyes: Negative for visual disturbance.  Respiratory: Positive for shortness of breath. Negative for  cough, chest tightness and wheezing.   Cardiovascular: Positive for chest pain. Negative for palpitations and leg swelling.  Gastrointestinal: Negative for abdominal pain, constipation, diarrhea, nausea and vomiting.  Genitourinary: Negative for flank pain.  Musculoskeletal: Negative for back pain, neck pain and neck stiffness.  Skin: Negative for rash and wound.  Neurological: Negative for light-headedness, numbness and headaches.  Psychiatric/Behavioral: Negative for agitation and confusion.  All other systems reviewed and are negative.    Physical Exam Updated Vital Signs BP 139/79    Pulse 63    Temp 98.5 F (36.9 C) (Oral)    Resp 20    Ht 6\' 4"  (1.93 m)    Wt 69.3 kg    SpO2 97%    BMI 18.60 kg/m  Physical Exam Vitals signs and nursing note reviewed.  Constitutional:      General: He is not in acute distress.    Appearance: He is well-developed. He is not ill-appearing, toxic-appearing or diaphoretic.  HENT:     Head: Normocephalic and atraumatic.     Nose: Nose normal. No congestion or rhinorrhea.     Mouth/Throat:     Mouth: Mucous membranes are moist.     Pharynx: Oropharynx is clear. No oropharyngeal exudate or posterior oropharyngeal erythema.  Eyes:     Extraocular Movements: Extraocular movements intact.     Conjunctiva/sclera: Conjunctivae normal.     Pupils: Pupils are equal, round, and reactive to light.  Neck:     Musculoskeletal: Normal range of motion and neck supple. No muscular tenderness.  Cardiovascular:     Rate and Rhythm: Normal rate and regular rhythm.     Heart sounds: No murmur.  Pulmonary:     Effort: Pulmonary effort is normal. No tachypnea or respiratory distress.     Breath sounds: Examination of the right-lower field reveals decreased breath sounds. Examination of the left-lower field reveals decreased breath sounds. Decreased breath sounds present. No wheezing, rhonchi or rales.  Chest:     Chest wall: Tenderness present. No lacerations.      Abdominal:     General: Abdomen is flat. There is no distension.     Palpations: Abdomen is soft.     Tenderness: There is no abdominal tenderness. There is no right CVA tenderness or left CVA tenderness.  Musculoskeletal: Normal range of motion.        General: Tenderness and signs of injury present.     Right elbow: He exhibits normal range of motion, no swelling, no effusion, no deformity and no laceration. Tenderness found.     Left knee: He exhibits no swelling and no laceration. Tenderness found.     Right lower leg: No edema.     Left lower leg: No edema.     Comments: Tenderness in right elbow and left knee.  No lacerations.  Bruising seen.  Good grip strength, sensation, and pulse in upper extremities.  Good ankle strength, sensation, and pulse in left foot  Skin:    General: Skin is warm and dry.     Capillary Refill: Capillary refill takes less than 2 seconds.     Findings: No erythema or rash.  Neurological:     General: No focal deficit present.     Mental Status: He is alert.  Psychiatric:        Mood and Affect: Mood normal.      ED Treatments / Results  Labs (all labs ordered are listed, but only abnormal results are displayed) Labs Reviewed  HEPATIC FUNCTION PANEL - Abnormal; Notable for the following components:      Result Value   Total Protein 5.9 (*)    Albumin 2.8 (*)    All other components within normal limits  BASIC METABOLIC PANEL - Abnormal; Notable for the following components:   CO2 15 (*)    Calcium 8.5 (*)    Anion gap 17 (*)    All other components within normal limits  CBC - Abnormal; Notable for the following components:   RBC 3.99 (*)    MCV 108.8 (*)    MCH 35.8 (*)    All other components within normal limits  URINE CULTURE  URINALYSIS, ROUTINE W REFLEX MICROSCOPIC  LIPASE, BLOOD  CBG MONITORING, ED  TROPONIN I (HIGH  SENSITIVITY)  TROPONIN I (HIGH SENSITIVITY)    EKG EKG Interpretation  Date/Time:  Sunday April 14 2019 10:14:09 EST Ventricular Rate:  62 PR Interval:    QRS Duration: 107 QT Interval:  427 QTC Calculation: 434 R Axis:   72 Text Interpretation: Sinus rhythm Multiple ventricular premature complexes Incomplete left bundle branch block LVH with secondary repolarization abnormality Anterior Q waves, possibly due to LVH When compared to prior, slower rate and more PVC./ No STEMI Confirmed by Antony Blackbird [phone]) on 04/14/2019 10:30:50 AM   Radiology Dg Chest 1 View  Result Date: 04/13/2019 CLINICAL DATA:  68 year old male with a history of thoracentesis EXAM: CHEST  1 VIEW COMPARISON:  04/12/2019 FINDINGS: Cardiomediastinal silhouette unchanged in size and contour. Surgical changes of median sternotomy and CABG. Vascular stent in the region the branch vessels. Improved pleural fluid on the right with no visualized pneumothorax. Right-sided rib fractures, incompletely healed/remodeled. Opacity at the left lung base persists with blunting of left costophrenic angle. No new confluent airspace disease IMPRESSION: The decreased right pleural fluid status post thoracentesis with no pneumothorax. Similar appearance of ill-defined opacity at the left lung base likely a combination of pleural fluid and atelectasis/consolidation. Surgical changes of median sternotomy and CABG. Electronically Signed   By: Corrie Mckusick D.O.   On: 04/13/2019 12:02   Dg Chest 2 View  Result Date: 04/14/2019 CLINICAL DATA:  Status post fall EXAM: CHEST - 2 VIEW COMPARISON:  04/13/2019 FINDINGS: Small bilateral pleural effusions with bibasilar atelectasis. Focal consolidation. No pneumothorax. Stable cardiomediastinal silhouette. Prior CABG. No acute osseous abnormality. Old right rib fracture. IMPRESSION: 1. No acute cardiopulmonary disease. 2. Bilateral small pleural effusions with bibasilar atelectasis. Electronically Signed   By: Kathreen Devoid   On: 04/14/2019 13:25   Dg Elbow Complete Right  Result Date:  04/14/2019 CLINICAL DATA:  Fall EXAM: RIGHT ELBOW - COMPLETE 3+ VIEW COMPARISON:  None. FINDINGS: No fracture or dislocation of the right elbow. There is moderate humeroulnar joint arthrosis. No elbow joint effusion. Soft tissue edema over the olecranon. IMPRESSION: 1. No fracture or dislocation of the right elbow. There is moderate humeroulnar joint arthrosis. No elbow joint effusion. 2. Soft tissue edema over the olecranon. Electronically Signed   By: Eddie Candle M.D.   On: 04/14/2019 13:23   Dg Knee Complete 4 Views Left  Result Date: 04/14/2019 CLINICAL DATA:  Status post fall. EXAM: LEFT KNEE - COMPLETE 4+ VIEW COMPARISON:  None. FINDINGS: No acute fracture or dislocation. No aggressive osseous lesion. Severe lateral femorotibial compartment joint space narrowing. Mild medial femorotibial compartment joint space narrowing. Moderate patellofemoral compartment joint space narrowing. Tricompartmental marginal osteophytes. Small joint effusion. Numerous small loose bodies in the posteromedial joint space. No focal soft tissue abnormality. Partially visualized distal intramedullary femoral nail. IMPRESSION: 1.  No acute osseous injury of the left knee. 2. Tricompartmental osteoarthritis of the left knee most severe in the lateral femorotibial compartment as described above. Electronically Signed   By: Kathreen Devoid   On: 04/14/2019 13:23   US Thoracentesis Asp Pleural Space W/img Guide  Result Date: 04/13/2019 INDICATION: Patient admitted with chest pain. Found to have right pleural effusion. Request for diagnostic and therapeutic thoracentesis. EXAM: ULTRASOUND GUIDED RIGHT THORACENTESIS MEDICATIONS: 1% lidocaine 10 mL COMPLICATIONS: None immediate. PROCEDURE: An ultrasound guided thoracentesis was thoroughly discussed with the patient and questions answered. The benefits, risks, alternatives and complications were also discussed. The patient understands and wishes to proceed with the procedure. Written  consent was  obtained. Ultrasound was performed to localize and mark an adequate pocket of fluid in the right chest. The area was then prepped and draped in the normal sterile fashion. 1% Lidocaine was used for local anesthesia. Under ultrasound guidance a 6 Fr Safe-T-Centesis catheter was introduced. Thoracentesis was performed. The catheter was removed and a dressing applied. FINDINGS: A total of approximately 400 mL of clear yellow fluid was removed. Samples were sent to the laboratory as requested by the clinical team. IMPRESSION: Successful ultrasound guided right thoracentesis yielding 400 mL of pleural fluid. No pneumothorax on post-procedure chest x-ray. Read by: Gareth Eagle, PA-C Electronically Signed   By: Corrie Mckusick D.O.   On: 04/13/2019 13:34    Procedures Procedures (including critical care time)  Medications Ordered in ED Medications  sodium chloride flush (NS) 0.9 % injection 3 mL (has no administration in time range)  LORazepam (ATIVAN) tablet 1 mg (1 mg Oral Given 04/14/19 1231)     Initial Impression / Assessment and Plan / ED Course  I have reviewed the triage vital signs and the nursing notes.  Pertinent labs & imaging results that were available during my care of the patient were reviewed by me and considered in my medical decision making (see chart for details).        [name] Pace is a 68 y.o. male with a past medical history significant for CAD status post CABG, prostate cancer, prior stroke, polysubstance abuse, prior rib fractures, chronic knee pains, and recent discharge from hospital yesterday after R thoracentesis for pleural effusion who presents with left knee pain, right elbow pain, and chest pain after he fell this morning.  He reports he was discharged yesterday after admission for chest pain and had bilateral thoracentesis for pleural effusions.  He reports that he chronically has problems with the left knee which needs replaced but it "gave out causing him  to fall hitting his left knee, right elbow, and his chest.  He denies new headache or neck pain or neck stiffness.  Denies any new back pains.  Chart review shows that patient was evaluated and signed off by cardiology during his recent admission.  Patient was also treated for urinary tract infection with Cipro and he completed his antibiotics.    On exam, patient does have decreased breath sounds in bilateral bases with some rhonchi.  Chest was tender across his chest.  No laceration seen.  Abdomen nontender.  Patient moving all extremities.  Tenderness present in left knee and right elbow.  Good strength and sensation in hands.  Good pulses.  Legs have good strength and sensation.  Patient is alert and oriented.  Patient all imaging of his left knee and right elbow as well as his chest to look for injuries.  We will also look for recurrent pleural effusion given his continued chest discomfort and some shortness of breath.  He will have some screening labs.  Dissipate reassessment for work-up.  Patient requested some anxiety medication was given a dose of Ativan orally.  Anticipate reassessment.  Patient's work-up was overall reassuring.  X-ray of the left knee showed no fracture but he did have severe degenerative disease.  Given the patient's pain, patient requested knee immobilizer which was ordered.  He also wanted crutches which were ordered.  He did not want a walker.  Patient's other labs appear similar to prior with leg troponin x2.  It was improved from his visit.  Other labs reassuring and x-ray showed small pleural effusions that  had not worsened this like prior.  Elbow x-ray did not show fracture or dislocation.  Given his reassuring traumatic imaging and feeling better with reassuring labs, we feel he safe for discharge home.  He agreed with discharge and will follow up with his outpatient orthopedics team for his knee.  He understands return precautions and follow-up instructions and  was discharged in good condition with improved symptoms.    Final Clinical Impressions(s) / ED Diagnoses   Final diagnoses:  Fall, initial encounter  Acute pain of left knee  Right elbow pain  Chest pain, unspecified type    ED Discharge Orders    None      Clinical Impression: 1. Fall, initial encounter   2. Acute pain of left knee   3. Right elbow pain   4. Chest pain, unspecified type     Disposition: Discharge  Condition: Good  I have discussed the results, Dx and Tx plan with the pt(& family if present). He/she/they expressed understanding and agree(s) with the plan. Discharge instructions discussed at great length. Strict return precautions discussed and pt &/or family have verbalized understanding of the instructions. No further questions at time of discharge.    New Prescriptions   No medications on file    Follow Up: Your orthopedics team     [facility] [place] [phone] Schedule an appointment as soon as possible for a visit    [place] [address] [id number] [place]       Charlesia Canaday, Gwenyth Allegra, MD 04/14/19 1700

## 2019-04-14 NOTE — ED Notes (Signed)
Attempted to change pt's pants, but he refused, stating he would just change them when he got home.

## 2019-04-15 ENCOUNTER — Encounter (HOSPITAL_COMMUNITY): Payer: Self-pay | Admitting: Emergency Medicine

## 2019-04-15 ENCOUNTER — Emergency Department (HOSPITAL_COMMUNITY)
Admission: EM | Admit: 2019-04-15 | Discharge: 2019-04-15 | Disposition: A | Discharge status: Home / Self Care | Payer: Medicare Other | Attending: Emergency Medicine | Admitting: Emergency Medicine

## 2019-04-15 ENCOUNTER — Emergency Department (HOSPITAL_COMMUNITY): Payer: Medicare Other

## 2019-04-15 ENCOUNTER — Other Ambulatory Visit: Payer: Self-pay

## 2019-04-15 DIAGNOSIS — M25561 Pain in right knee: Secondary | ICD-10-CM | POA: Diagnosis not present

## 2019-04-15 DIAGNOSIS — R279 Unspecified lack of coordination: Secondary | ICD-10-CM | POA: Diagnosis not present

## 2019-04-15 DIAGNOSIS — J449 Chronic obstructive pulmonary disease, unspecified: Secondary | ICD-10-CM | POA: Diagnosis not present

## 2019-04-15 DIAGNOSIS — M25551 Pain in right hip: Secondary | ICD-10-CM | POA: Diagnosis not present

## 2019-04-15 DIAGNOSIS — M25562 Pain in left knee: Secondary | ICD-10-CM | POA: Insufficient documentation

## 2019-04-15 DIAGNOSIS — I2510 Atherosclerotic heart disease of native coronary artery without angina pectoris: Secondary | ICD-10-CM | POA: Insufficient documentation

## 2019-04-15 DIAGNOSIS — Z8546 Personal history of malignant neoplasm of prostate: Secondary | ICD-10-CM | POA: Insufficient documentation

## 2019-04-15 DIAGNOSIS — Z743 Need for continuous supervision: Secondary | ICD-10-CM | POA: Diagnosis not present

## 2019-04-15 DIAGNOSIS — M25559 Pain in unspecified hip: Secondary | ICD-10-CM

## 2019-04-15 DIAGNOSIS — Z951 Presence of aortocoronary bypass graft: Secondary | ICD-10-CM | POA: Insufficient documentation

## 2019-04-15 DIAGNOSIS — Z853 Personal history of malignant neoplasm of breast: Secondary | ICD-10-CM | POA: Insufficient documentation

## 2019-04-15 DIAGNOSIS — Z8673 Personal history of transient ischemic attack (TIA), and cerebral infarction without residual deficits: Secondary | ICD-10-CM | POA: Diagnosis not present

## 2019-04-15 DIAGNOSIS — W19XXXA Unspecified fall, initial encounter: Secondary | ICD-10-CM | POA: Diagnosis not present

## 2019-04-15 DIAGNOSIS — F17210 Nicotine dependence, cigarettes, uncomplicated: Secondary | ICD-10-CM | POA: Insufficient documentation

## 2019-04-15 DIAGNOSIS — M25552 Pain in left hip: Secondary | ICD-10-CM | POA: Insufficient documentation

## 2019-04-15 DIAGNOSIS — M160 Bilateral primary osteoarthritis of hip: Secondary | ICD-10-CM | POA: Diagnosis not present

## 2019-04-15 DIAGNOSIS — R52 Pain, unspecified: Secondary | ICD-10-CM | POA: Diagnosis not present

## 2019-04-15 DIAGNOSIS — R5381 Other malaise: Secondary | ICD-10-CM | POA: Diagnosis not present

## 2019-04-15 LAB — URINE CULTURE: Culture: <10000 — AB

## 2019-04-15 LAB — PH, BODY FLUID: pH, Body Fluid: 7.6

## 2019-04-15 MED ORDER — LIDOCAINE 5 % EX PTCH
3.0000 | MEDICATED_PATCH | CUTANEOUS | Status: DC
  Administered 2019-04-15: 3 via TRANSDERMAL
  Filled 2019-04-15: qty 3

## 2019-04-15 MED ORDER — KETOROLAC TROMETHAMINE 60 MG/2ML IM SOLN
60.0000 mg | Freq: Once | INTRAMUSCULAR | Status: AC
  Administered 2019-04-15: 06:00:00 60 mg via INTRAMUSCULAR
  Filled 2019-04-15: qty 2

## 2019-04-15 MED ORDER — LIDOCAINE 5 % EX PTCH: 1.0000 | MEDICATED_PATCH | CUTANEOUS | 0 refills | Status: DC

## 2019-04-15 NOTE — ED Triage Notes (Addendum)
Arrives via EMS. C/C bilateral hip and bilateral knee pain, [facility]x [facility] for same, advised to follow up with ortho. Has not seen them yet, but came back because of his pain. He is 'allergic' to pain.

## 2019-04-15 NOTE — ED Provider Notes (Addendum)
[facility] DEPT Provider Note   CSN: [id number] Arrival date & time: 04/15/19  [id number]     History   Chief Complaint No chief complaint on file.   HPI [name] Pace is a 68 y.o. male.     The history is provided by the patient.  Extremity Pain This is a chronic problem. The current episode started more than 1 week ago. The problem occurs constantly. The problem has not changed since onset.Pertinent negatives include no chest pain, no abdominal pain, no headaches and no shortness of breath. Nothing aggravates the symptoms. Nothing relieves the symptoms. He has tried nothing for the symptoms. The treatment provided no relief.  Chronic knee B and hip pain.  Has been seen and told to follow up with orthopedics but has not and returns for pain.    Past Medical History:  Diagnosis Date  . Alcohol abuse   . Anxiety   . Breast cancer ([place]) Left breast   Per patient diagnosed in early 54's.   . Cocaine abuse ([place]) 08/19/2018  . Coronary artery disease   . High cholesterol   . Left rib fracture 08/19/2018  . Prostate cancer ([place])    per patient diagnosed in early 71's  . Stroke [facility])     Patient Active Problem List   Diagnosis Date Noted  . Elevated troponin   . Severe alcohol use disorder ([place])   . Abnormal echocardiogram 12/02/2018  . Compression fracture of T6 vertebra (HCC)   . Compression fracture of T7 vertebra (HCC)   . Compression fracture of T8 vertebra ([place])   . COPD exacerbation ([place]) 12/01/2018  . T6 vertebral fracture ([place]) 12/01/2018  . T7 vertebral fracture ([place]) 12/01/2018  . T8 vertebral fracture ([place]) 12/01/2018  . Cocaine abuse ([place]) 08/19/2018  . Polysubstance abuse ([place]) 08/19/2018  . Left rib fracture 08/19/2018  . Potomania 08/19/2018  . Alcohol abuse with intoxication ([place]) 08/19/2018  . At risk for adverse drug event 06/12/2018  . Palliative care encounter   . Encephalopathy acute   . Delirium tremens ([place]) 05/18/2018   . Malnutrition of moderate degree 05/18/2018  . Acute respiratory failure ([place])   . Shock circulatory ([place])   . Multiple rib fractures 05/14/2018  . Pneumothorax, closed, traumatic, initial encounter 05/14/2018  . Traumatic fracture of ribs with pneumothorax   . Laceration of muscle(s) and tendon(s) of anterior muscle group at lower leg level, right leg, initial encounter 02/10/2016  . Traumatic rupture of right anterior tibial tendon 02/10/2016  . Hip fracture, left ([place]) 07/02/2014  . Closed left hip fracture ([place]) 07/02/2014  . Hip fracture ([place]) 07/02/2014  . Intertrochanteric fracture of left femur ([place]) 07/02/2014  . History of stroke   . Preop examination   . Subclavian artery stenosis, left ([place]) 06/15/2014  . Chest pain 06/14/2014  . CAP (community acquired pneumonia) 06/14/2014  . Sepsis ([place]) 06/14/2014  . Alcohol abuse 06/14/2014  . CAD (coronary artery disease) 06/14/2014  . Tobacco abuse 06/14/2014    Past Surgical History:  Procedure Laterality Date  . CAROTID STENT    . CORONARY ARTERY BYPASS GRAFT    . FEMUR IM NAIL Left 07/02/2014   Procedure: INTRAMEDULLARY (IM) NAIL FEMORAL;  Surgeon: Elie Goody, MD;  Location: [facility];  Service: Orthopedics;  Laterality: Left;  . I&D EXTREMITY Right 02/10/2016   Procedure: IRRIGATION AND DEBRIDEMENT EXTREMITY;  Surgeon: Dorna Leitz, MD;  Location: [facility];  Service: Orthopedics;  Laterality: Right;  . TENDON  REPAIR Right 02/10/2016   Procedure: Anterior TENDON REPAIR and wound exploration.;  Surgeon: Dorna Leitz, MD;  Location: [facility];  Service: Orthopedics;  Laterality: Right;        Home Medications    Prior to Admission medications   Medication Sig Start Date End Date Taking? Authorizing Provider  albuterol (VENTOLIN HFA) 108 (90 Base) MCG/ACT inhaler Inhale 2 puffs into the lungs every 6 (six) hours as needed for wheezing or shortness of breath. Use 2 puffs 3 times daily x5 days, then every 6 hours as needed.  04/13/19   Aline August, MD  aspirin EC 81 MG tablet Take 1 tablet (81 mg total) by mouth daily. 04/13/19   Aline August, MD  atorvastatin (LIPITOR) 40 MG tablet Take 1 tablet (40 mg total) by mouth daily at 6 PM. 04/13/19   Aline August, MD  chlordiazePOXIDE (LIBRIUM) 25 MG capsule 25 mg twice daily for 1 day then 25 mg daily for 1 day then discontinue 04/13/19   Aline August, MD  cyanocobalamin 1000 MCG tablet Take 1 tablet (1,000 mcg total) by mouth daily. 04/13/19   Aline August, MD  folic acid (FOLVITE) 1 MG tablet Take 1 tablet (1 mg total) by mouth daily. 04/13/19   Aline August, MD  lisinopril (ZESTRIL) 40 MG tablet Take 1 tablet (40 mg total) by mouth daily. 04/14/19   Aline August, MD  metoprolol tartrate (LOPRESSOR) 25 MG tablet Take 1 tablet (25 mg total) by mouth 2 (two) times daily. 04/13/19   Aline August, MD  mometasone-formoterol (DULERA) 100-5 MCG/ACT AERO Inhale 2 puffs into the lungs 2 (two) times daily. 04/13/19   Aline August, MD  Multiple Vitamin (MULTIVITAMIN WITH MINERALS) TABS tablet Take 1 tablet by mouth daily. 04/13/19   Aline August, MD  thiamine 100 MG tablet Take 1 tablet (100 mg total) by mouth daily. 04/14/19   Aline August, MD  tiotropium (SPIRIVA HANDIHALER) 18 MCG inhalation capsule Place 1 capsule (18 mcg total) into inhaler and inhale daily. 04/13/19 05/13/19  Aline August, MD    Family History Family History  Problem Relation Age of Onset  . Dementia Mother     Social History Social History   Tobacco Use  . Smoking status: Current Every Day Smoker    Packs/day: 1.00    Years: 42.00    Pack years: 42.00    Types: Cigarettes  . Smokeless tobacco: Never Used  Substance Use Topics  . Alcohol use: Yes    Comment: 4-5 beers a day  . Drug use: No     Allergies   Penicillins   Review of Systems Review of Systems  Constitutional: Negative for fever.  HENT: Negative for congestion.   Eyes: Negative for visual disturbance.   Respiratory: Negative for shortness of breath.   Cardiovascular: Negative for chest pain.  Gastrointestinal: Negative for abdominal pain.  Genitourinary: Negative for difficulty urinating.  Musculoskeletal: Positive for arthralgias. Negative for back pain and gait problem.  Skin: Negative for color change.  Neurological: Negative for headaches.  Psychiatric/Behavioral: Negative for agitation.  All other systems reviewed and are negative.    Physical Exam Updated Vital Signs BP 124/84 (BP Location: Right Arm)   Pulse 85   Temp 98.4 F (36.9 C) (Oral)   Resp 15   Ht 6\' 4"  (1.93 m)   Wt 69.3 kg   SpO2 98%   BMI 18.60 kg/m   Physical Exam Vitals signs and nursing note reviewed.  Constitutional:  Appearance: He is normal weight.     Comments: Sleepy on exam  HENT:     Head: Normocephalic and atraumatic.     Nose: Nose normal.  Eyes:     Conjunctiva/sclera: Conjunctivae normal.     Pupils: Pupils are equal, round, and reactive to light.  Neck:     Musculoskeletal: Normal range of motion and neck supple.  Cardiovascular:     Rate and Rhythm: Normal rate and regular rhythm.     Pulses: Normal pulses.     Heart sounds: Normal heart sounds.  Pulmonary:     Effort: Pulmonary effort is normal.     Breath sounds: Normal breath sounds.  Abdominal:     General: Abdomen is flat. Bowel sounds are normal.     Tenderness: There is no abdominal tenderness. There is no guarding.  Musculoskeletal: Normal range of motion.        General: No deformity.     Right hip: Normal.     Left hip: Normal.     Right knee: Normal.     Left knee: Normal.     Right ankle: Normal. Achilles tendon normal.     Left ankle: Normal. Achilles tendon normal.     Right upper leg: Normal.     Left upper leg: Normal.     Right lower leg: No edema.     Left lower leg: No edema.  Skin:    General: Skin is warm and dry.     Capillary Refill: Capillary refill takes less than 2 seconds.  Neurological:      General: No focal deficit present.     Mental Status: He is alert and oriented to person, place, and time.  Psychiatric:        Mood and Affect: Mood normal.        Behavior: Behavior normal.      ED Treatments / Results  Labs (all labs ordered are listed, but only abnormal results are displayed) Labs Reviewed - No data to display  EKG None  Radiology Dg Chest 1 View  Result Date: 04/13/2019 CLINICAL DATA:  68 year old male with a history of thoracentesis EXAM: CHEST  1 VIEW COMPARISON:  04/12/2019 FINDINGS: Cardiomediastinal silhouette unchanged in size and contour. Surgical changes of median sternotomy and CABG. Vascular stent in the region the branch vessels. Improved pleural fluid on the right with no visualized pneumothorax. Right-sided rib fractures, incompletely healed/remodeled. Opacity at the left lung base persists with blunting of left costophrenic angle. No new confluent airspace disease IMPRESSION: The decreased right pleural fluid status post thoracentesis with no pneumothorax. Similar appearance of ill-defined opacity at the left lung base likely a combination of pleural fluid and atelectasis/consolidation. Surgical changes of median sternotomy and CABG. Electronically Signed   By: Corrie Mckusick D.O.   On: 04/13/2019 12:02   Dg Chest 2 View  Result Date: 04/14/2019 CLINICAL DATA:  Status post fall EXAM: CHEST - 2 VIEW COMPARISON:  04/13/2019 FINDINGS: Small bilateral pleural effusions with bibasilar atelectasis. Focal consolidation. No pneumothorax. Stable cardiomediastinal silhouette. Prior CABG. No acute osseous abnormality. Old right rib fracture. IMPRESSION: 1. No acute cardiopulmonary disease. 2. Bilateral small pleural effusions with bibasilar atelectasis. Electronically Signed   By: Kathreen Devoid   On: 04/14/2019 13:25   Dg Elbow Complete Right  Result Date: 04/14/2019 CLINICAL DATA:  Fall EXAM: RIGHT ELBOW - COMPLETE 3+ VIEW COMPARISON:  None. FINDINGS: No  fracture or dislocation of the right elbow. There is moderate humeroulnar joint  arthrosis. No elbow joint effusion. Soft tissue edema over the olecranon. IMPRESSION: 1. No fracture or dislocation of the right elbow. There is moderate humeroulnar joint arthrosis. No elbow joint effusion. 2. Soft tissue edema over the olecranon. Electronically Signed   By: Eddie Candle M.D.   On: 04/14/2019 13:23   Dg Knee Complete 4 Views Left  Result Date: 04/14/2019 CLINICAL DATA:  Status post fall. EXAM: LEFT KNEE - COMPLETE 4+ VIEW COMPARISON:  None. FINDINGS: No acute fracture or dislocation. No aggressive osseous lesion. Severe lateral femorotibial compartment joint space narrowing. Mild medial femorotibial compartment joint space narrowing. Moderate patellofemoral compartment joint space narrowing. Tricompartmental marginal osteophytes. Small joint effusion. Numerous small loose bodies in the posteromedial joint space. No focal soft tissue abnormality. Partially visualized distal intramedullary femoral nail. IMPRESSION: 1.  No acute osseous injury of the left knee. 2. Tricompartmental osteoarthritis of the left knee most severe in the lateral femorotibial compartment as described above. Electronically Signed   By: Kathreen Devoid   On: 04/14/2019 13:23   US Thoracentesis Asp Pleural Space W/img Guide  Result Date: 04/13/2019 INDICATION: Patient admitted with chest pain. Found to have right pleural effusion. Request for diagnostic and therapeutic thoracentesis. EXAM: ULTRASOUND GUIDED RIGHT THORACENTESIS MEDICATIONS: 1% lidocaine 10 mL COMPLICATIONS: None immediate. PROCEDURE: An ultrasound guided thoracentesis was thoroughly discussed with the patient and questions answered. The benefits, risks, alternatives and complications were also discussed. The patient understands and wishes to proceed with the procedure. Written consent was obtained. Ultrasound was performed to localize and mark an adequate pocket of fluid in the  right chest. The area was then prepped and draped in the normal sterile fashion. 1% Lidocaine was used for local anesthesia. Under ultrasound guidance a 6 Fr Safe-T-Centesis catheter was introduced. Thoracentesis was performed. The catheter was removed and a dressing applied. FINDINGS: A total of approximately 400 mL of clear yellow fluid was removed. Samples were sent to the laboratory as requested by the clinical team. IMPRESSION: Successful ultrasound guided right thoracentesis yielding 400 mL of pleural fluid. No pneumothorax on post-procedure chest x-ray. Read by: Gareth Eagle, PA-C Electronically Signed   By: Corrie Mckusick D.O.   On: 04/13/2019 13:34    Procedures Procedures (including critical care time)  Medications Ordered in ED Medications  ketorolac (TORADOL) injection 60 mg (has no administration in time range)  lidocaine (LIDODERM) 5 % 3 patch (has no administration in time range)     Initial Impression / Assessment and Plan / ED Course  Patient has been seen for this.  He will need to follow up with orthopedics for ongoing pain as previously directed.  He is somnolent and does not need narcotics for this pain.  Lidoderm and NSAIDS and close follow up with orthopedics.    ARHAN CERVANTES was evaluated in Emergency Department on 04/15/2019 for the symptoms described in the history of present illness. He was evaluated in the context of the global COVID-19 pandemic, which necessitated consideration that the patient might be at risk for infection with the SARS-CoV-2 virus that causes COVID-19. Institutional protocols and algorithms that pertain to the evaluation of patients at risk for COVID-19 are in a state of rapid change based on information released by regulatory bodies including the CDC and federal and state organizations. These policies and algorithms were followed during the patient's care in the ED.   Final Clinical Impressions(s) / ED Diagnoses   Return for weakness, numbness,  changes in vision or speech, fevers >100.4 unrelieved  by medication, shortness of breath, intractable vomiting, or diarrhea, abdominal pain, Inability to tolerate liquids or food, cough, altered mental status or any concerns. No signs of systemic illness or infection. The patient is nontoxic-appearing on exam and vital signs are within normal limits.   I have reviewed the triage vital signs and the nursing notes. Pertinent labs &imaging results that were available during my care of the patient were reviewed by me and considered in my medical decision making (see chart for details).  After history, exam, and medical workup I feel the patient has been appropriately medically screened and is safe for discharge home. Pertinent diagnoses were discussed with the patient. Patient was given return precautions      Keneshia Tena, MD 04/15/19 [place], Eliodoro Gullett, MD 04/15/19 [id number]

## 2019-04-15 NOTE — ED Notes (Signed)
[facility] called for transport.  

## 2019-04-15 NOTE — ED Notes (Signed)
Patient transported to X-ray 

## 2019-04-15 NOTE — ED Notes (Signed)
Patient requesting staff to cut his toenails.

## 2019-04-16 DIAGNOSIS — M545 Low back pain: Secondary | ICD-10-CM | POA: Diagnosis not present

## 2019-04-16 DIAGNOSIS — Z743 Need for continuous supervision: Secondary | ICD-10-CM | POA: Diagnosis not present

## 2019-04-16 DIAGNOSIS — Z7401 Bed confinement status: Secondary | ICD-10-CM | POA: Diagnosis not present

## 2019-04-16 DIAGNOSIS — M1712 Unilateral primary osteoarthritis, left knee: Secondary | ICD-10-CM | POA: Diagnosis not present

## 2019-04-16 DIAGNOSIS — W19XXXA Unspecified fall, initial encounter: Secondary | ICD-10-CM | POA: Diagnosis not present

## 2019-04-16 DIAGNOSIS — M2550 Pain in unspecified joint: Secondary | ICD-10-CM | POA: Diagnosis not present

## 2019-04-16 DIAGNOSIS — S20212A Contusion of left front wall of thorax, initial encounter: Secondary | ICD-10-CM | POA: Diagnosis not present

## 2019-04-16 LAB — CYTOLOGY - NON PAP

## 2019-04-18 LAB — CULTURE, BODY FLUID W GRAM STAIN -BOTTLE: Culture: NO GROWTH

## 2019-05-13 LAB — FUNGAL ORGANISM REFLEX

## 2019-05-13 LAB — FUNGUS CULTURE WITH STAIN

## 2019-05-13 LAB — FUNGUS CULTURE RESULT

## 2019-09-02 ENCOUNTER — Emergency Department (HOSPITAL_COMMUNITY)
Admission: EM | Admit: 2019-09-02 | Discharge: 2019-09-02 | Disposition: A | Discharge status: Home / Self Care | Payer: Medicare Other | Attending: Emergency Medicine | Admitting: Emergency Medicine

## 2019-09-02 ENCOUNTER — Other Ambulatory Visit: Payer: Self-pay

## 2019-09-02 ENCOUNTER — Emergency Department (HOSPITAL_COMMUNITY): Payer: Medicare Other

## 2019-09-02 DIAGNOSIS — R0789 Other chest pain: Secondary | ICD-10-CM | POA: Diagnosis present

## 2019-09-02 DIAGNOSIS — Z79899 Other long term (current) drug therapy: Secondary | ICD-10-CM | POA: Diagnosis not present

## 2019-09-02 DIAGNOSIS — R112 Nausea with vomiting, unspecified: Secondary | ICD-10-CM | POA: Insufficient documentation

## 2019-09-02 DIAGNOSIS — F10120 Alcohol abuse with intoxication, uncomplicated: Secondary | ICD-10-CM | POA: Insufficient documentation

## 2019-09-02 DIAGNOSIS — F17210 Nicotine dependence, cigarettes, uncomplicated: Secondary | ICD-10-CM | POA: Insufficient documentation

## 2019-09-02 DIAGNOSIS — F10920 Alcohol use, unspecified with intoxication, uncomplicated: Secondary | ICD-10-CM

## 2019-09-02 DIAGNOSIS — Y907 Blood alcohol level of 200-239 mg/100 ml: Secondary | ICD-10-CM | POA: Insufficient documentation

## 2019-09-02 DIAGNOSIS — F1410 Cocaine abuse, uncomplicated: Secondary | ICD-10-CM | POA: Insufficient documentation

## 2019-09-02 DIAGNOSIS — Z853 Personal history of malignant neoplasm of breast: Secondary | ICD-10-CM | POA: Insufficient documentation

## 2019-09-02 DIAGNOSIS — Z7982 Long term (current) use of aspirin: Secondary | ICD-10-CM | POA: Diagnosis not present

## 2019-09-02 DIAGNOSIS — Z8546 Personal history of malignant neoplasm of prostate: Secondary | ICD-10-CM | POA: Diagnosis not present

## 2019-09-02 LAB — COMPREHENSIVE METABOLIC PANEL
ALT: 25 U/L (ref 0–44)
AST: 32 U/L (ref 15–41)
Albumin: 3 g/dL — ABNORMAL LOW (ref 3.5–5.0)
Alkaline Phosphatase: 64 U/L (ref 38–126)
Anion gap: 12 (ref 5–15)
BUN: <5 mg/dL — ABNORMAL LOW (ref 8–23)
CO2: 22 mmol/L (ref 22–32)
Calcium: 7.8 mg/dL — ABNORMAL LOW (ref 8.9–10.3)
Chloride: 97 mmol/L — ABNORMAL LOW (ref 98–111)
Creatinine, Ser: 0.56 mg/dL — ABNORMAL LOW (ref 0.61–1.24)
GFR calc Af Amer: >60 mL/min (ref >60)
GFR calc non Af Amer: >60 mL/min (ref >60)
Glucose, Bld: 93 mg/dL (ref 70–99)
Potassium: 3.5 mmol/L (ref 3.5–5.1)
Sodium: 131 mmol/L — ABNORMAL LOW (ref 135–145)
Total Bilirubin: 0.5 mg/dL (ref 0.3–1.2)
Total Protein: 5.4 g/dL — ABNORMAL LOW (ref 6.5–8.1)

## 2019-09-02 LAB — CBC
HCT: 37.7 % — ABNORMAL LOW (ref 39.0–52.0)
Hemoglobin: 12.9 g/dL — ABNORMAL LOW (ref 13.0–17.0)
MCH: 35.1 pg — ABNORMAL HIGH (ref 26.0–34.0)
MCHC: 34.2 g/dL (ref 30.0–36.0)
MCV: 102.7 fL — ABNORMAL HIGH (ref 80.0–100.0)
Platelets: 267 10*3/uL (ref 150–400)
RBC: 3.67 MIL/uL — ABNORMAL LOW (ref 4.22–5.81)
RDW: 13.2 % (ref 11.5–15.5)
WBC: 5.7 10*3/uL (ref 4.0–10.5)
nRBC: 0 % (ref 0.0–0.2)

## 2019-09-02 LAB — RAPID URINE DRUG SCREEN, HOSP PERFORMED
Amphetamines: NOT DETECTED
Barbiturates: NOT DETECTED
Benzodiazepines: NOT DETECTED
Cocaine: POSITIVE — AB
Opiates: NOT DETECTED
Tetrahydrocannabinol: NOT DETECTED

## 2019-09-02 LAB — TROPONIN I (HIGH SENSITIVITY): Troponin I (High Sensitivity): 12 ng/L (ref <18)

## 2019-09-02 LAB — ETHANOL: Alcohol, Ethyl (B): 236 mg/dL — ABNORMAL HIGH (ref <10)

## 2019-09-02 MED ORDER — THIAMINE HCL 100 MG PO TABS
100.0000 mg | ORAL_TABLET | Freq: Once | ORAL | Status: AC
  Administered 2019-09-02: 05:00:00 100 mg via ORAL
  Filled 2019-09-02: qty 1

## 2019-09-02 MED ORDER — ONDANSETRON 4 MG PO TBDP
4.0000 mg | ORAL_TABLET | Freq: Once | ORAL | Status: AC
  Administered 2019-09-02: 4 mg via ORAL
  Filled 2019-09-02: qty 1

## 2019-09-02 MED ORDER — CHLORDIAZEPOXIDE HCL 25 MG PO CAPS: 100.0000 mg | ORAL_CAPSULE | Freq: Once | ORAL | Status: DC

## 2019-09-02 MED ORDER — SODIUM CHLORIDE 0.9 % IV SOLN: INTRAVENOUS | Status: DC

## 2019-09-02 MED ORDER — THIAMINE HCL 100 MG/ML IJ SOLN: 100.0000 mg | Freq: Once | INTRAMUSCULAR | Status: DC

## 2019-09-02 MED ORDER — ONDANSETRON HCL 4 MG/2ML IJ SOLN: 4.0000 mg | Freq: Once | INTRAMUSCULAR | Status: DC

## 2019-09-02 NOTE — ED Notes (Signed)
While undressing pt, pt wouldn't let me take off his pants. He refused to let me touch his pants in an attempt to take them off. Attempted to take off pants 15 min ago.

## 2019-09-02 NOTE — ED Provider Notes (Signed)
TIME SEEN: 2:30 AM  CHIEF COMPLAINT: Chest pain, alcohol intoxication  HPI: Patient is a 69 year old male with history of alcohol and cocaine abuse, CAD, hyperlipidemia who presents to the emergency department with EMS for complaints of chest pain.  Patient reports that the police called EMS.  Police arrived at the scene due to a domestic dispute.  Patient reports he has been drinking "too much" alcohol today.  He denies history of alcohol abuse although this is documented in his chart.  States he has had a seizure before but does not think it is from alcohol withdrawal.  Denies any drug use.  Reports he has had intermittent left-sided chest pain that he describes as "just a pain" that does not feel like the discomfort he had when he had his CABG or previous stents.  He has had nausea and vomiting.  He is unable to tell me at this time if he has had shortness of breath, lower extremity swelling, fever, cough.  He is an extremely poor historian and is intoxicated.  ROS: Level 5 caveat secondary to intoxication  PAST MEDICAL HISTORY/PAST SURGICAL HISTORY:  Past Medical History:  Diagnosis Date  . Alcohol abuse   . Anxiety   . Breast cancer ([place]) Left breast   Per patient diagnosed in early 13's.   . Cocaine abuse ([place]) 08/19/2018  . Coronary artery disease   . High cholesterol   . Left rib fracture 08/19/2018  . Prostate cancer ([place])    per patient diagnosed in early 101's  . Stroke [facility])     MEDICATIONS:  Prior to Admission medications   Medication Sig Start Date End Date Taking? Authorizing Provider  albuterol (VENTOLIN HFA) 108 (90 Base) MCG/ACT inhaler Inhale 2 puffs into the lungs every 6 (six) hours as needed for wheezing or shortness of breath. Use 2 puffs 3 times daily x5 days, then every 6 hours as needed. 04/13/19   Aline August, MD  aspirin EC 81 MG tablet Take 1 tablet (81 mg total) by mouth daily. 04/13/19   Aline August, MD  atorvastatin (LIPITOR) 40 MG tablet Take 1 tablet  (40 mg total) by mouth daily at 6 PM. 04/13/19   Aline August, MD  chlordiazePOXIDE (LIBRIUM) 25 MG capsule 25 mg twice daily for 1 day then 25 mg daily for 1 day then discontinue 04/13/19   Aline August, MD  cyanocobalamin 1000 MCG tablet Take 1 tablet (1,000 mcg total) by mouth daily. 04/13/19   Aline August, MD  folic acid (FOLVITE) 1 MG tablet Take 1 tablet (1 mg total) by mouth daily. 04/13/19   Aline August, MD  lidocaine (LIDODERM) 5 % Place 1 patch onto the skin daily. Remove & Discard patch within 12 hours or as directed by MD 04/15/19   Palumbo, April, MD  lisinopril (ZESTRIL) 40 MG tablet Take 1 tablet (40 mg total) by mouth daily. 04/14/19   Aline August, MD  metoprolol tartrate (LOPRESSOR) 25 MG tablet Take 1 tablet (25 mg total) by mouth 2 (two) times daily. 04/13/19   Aline August, MD  mometasone-formoterol (DULERA) 100-5 MCG/ACT AERO Inhale 2 puffs into the lungs 2 (two) times daily. 04/13/19   Aline August, MD  Multiple Vitamin (MULTIVITAMIN WITH MINERALS) TABS tablet Take 1 tablet by mouth daily. 04/13/19   Aline August, MD  thiamine 100 MG tablet Take 1 tablet (100 mg total) by mouth daily. 04/14/19   Aline August, MD  tiotropium (SPIRIVA HANDIHALER) 18 MCG inhalation capsule Place 1 capsule (18 mcg  total) into inhaler and inhale daily. 04/13/19 05/13/19  Aline August, MD    ALLERGIES:  Allergies  Allergen Reactions  . Penicillins Anaphylaxis    Did it involve swelling of the face/tongue/throat, SOB, or low BP? Yes Did it involve sudden or severe rash/hives, skin peeling, or any reaction on the inside of your mouth or nose? No Did you need to seek medical attention at a hospital or doctor's office? No When did it last happen?childhood If all above answers are "NO", may proceed with cephalosporin use.     SOCIAL HISTORY:  Social History   Tobacco Use  . Smoking status: Current Every Day Smoker    Packs/day: 1.00    Years: 42.00    Pack years: 42.00     Types: Cigarettes  . Smokeless tobacco: Never Used  Substance Use Topics  . Alcohol use: Yes    Comment: 4-5 beers a day    FAMILY HISTORY: Family History  Problem Relation Age of Onset  . Dementia Mother     EXAM: BP 118/70 (BP Location: Right Arm)   Pulse 71   Temp 97.9 F (36.6 C) (Oral)   Resp 20   Ht 6\' 2"  (1.88 m)   Wt 65.8 kg   SpO2 97%   BMI 18.62 kg/m  CONSTITUTIONAL: Alert and oriented and responds appropriately to questions.  Chronically ill-appearing.  Extremely intoxicated. HEAD: Normocephalic EYES: Conjunctivae clear, pupils appear equal, EOM appear intact ENT: normal nose; moist mucous membranes NECK: Supple, normal ROM CARD: RRR; S1 and S2 appreciated; no murmurs, no clicks, no rubs, no gallops RESP: Normal chest excursion without splinting or tachypnea; breath sounds clear and equal bilaterally; no wheezes, no rhonchi, no rales, no hypoxia or respiratory distress, speaking full sentences ABD/GI: Normal bowel sounds; non-distended; soft, non-tender, no rebound, no guarding, no peritoneal signs, no hepatosplenomegaly BACK:  The back appears normal EXT: Normal ROM in all joints; no deformity noted, no edema; no cyanosis, no calf tenderness or calf swelling SKIN: Normal color for age and race; warm; no rash on exposed skin NEURO: Moves all extremities equally PSYCH: The patient's mood and manner are appropriate.   MEDICAL DECISION MAKING: Patient here with complaints of chest pain.  Seems atypical and he states that it does not feel like his anginal equivalent but he is intoxicated and an extremely poor historian.  I am unable to get patient to tell me whether or not he would have called EMS himself or come to the hospital for concerns for this chest pain.  It seems he is here after police arrived at his home for a domestic dispute and instead of getting arrested he came to the emergency department.  His EKG shows no new ischemic change.  He has normal vital signs.   Will obtain labs, chest x-ray, urine.  Will monitor until clinically sober.  Will hydrate and give thiamine.  He reports his biggest concern right now is nausea and vomiting.  Abdominal exam is benign.  Will give IV Zofran.  ED PROGRESS: Patient's chest x-ray shows chronic changes but no acute abnormality.  Drug screen positive for cocaine.  Multiple nurses have attempted to obtain peripheral IV without success.  I performed femoral vein puncture to obtain blood for labs.  Tech at bedside to hold pressure to right groin for 15 minutes.   Pt's labs show no significant abnormality other than alcohol level of 236.  Troponin is normal and this is after 2 days of pain.  I do  not feel this needs to be repeated.  He denies any further chest pain at this time.  No current shortness of breath.  He has been able to eat and drink without nausea or vomiting.  Patient normally ambulates with a walker which she does not have with him.  He is able to ambulate but nurse reports he does have an unsteady gait which he reports is baseline.  He reports he does not have a ride home and we are concerned that he would be unsafe getting in a taxi or on a bus without a walker.  Social work consult has been placed for transportation needs.   At this time, I do not feel there is any life-threatening condition present. I have reviewed, interpreted and discussed all results (EKG, imaging, lab, urine as appropriate) and exam findings with patient/family. I have reviewed nursing notes and appropriate previous records.  I feel the patient is safe to be discharged home without further emergent workup and can continue workup as an outpatient as needed. Discussed usual and customary return precautions. Patient/family verbalize understanding and are comfortable with this plan.  Outpatient follow-up has been provided as needed. All questions have been answered.    Date: 09/02/2019 3:03 AM  Rate: 69  Rhythm: normal sinus rhythm  QRS Axis:  normal  Intervals: normal  ST/T Wave abnormalities: Nonspecific inferior lateral ST and T wave abnormalities  Conduction Disutrbances: none  Narrative Interpretation: Inferior and lateral ST and T wave abnormalities consistent with multiple previous EKGs, no STEMI       [name] Pace was evaluated in Emergency Department on 09/02/2019 for the symptoms described in the history of present illness. He was evaluated in the context of the global COVID-19 pandemic, which necessitated consideration that the patient might be at risk for infection with the SARS-CoV-2 virus that causes COVID-19. Institutional protocols and algorithms that pertain to the evaluation of patients at risk for COVID-19 are in a state of rapid change based on information released by regulatory bodies including the CDC and federal and state organizations. These policies and algorithms were followed during the patient's care in the ED.  Patient was seen wearing N95, face shield, gloves.    Russie Gulledge, Delice Bison, DO 09/02/19 [phone]

## 2019-09-02 NOTE — Progress Notes (Signed)
CSW received consult for patient to assist with transportation. CSW spoke with Judson Roch, RN to discuss plan - both agreeable to discharge patient home via [facility] as he may require assistance walking into his home.  Madilyn Fireman, MSW, LCSW-A Transitions of Care  Clinical Social Worker  [facility] Emergency Departments  Medical ICU [phone]

## 2019-09-02 NOTE — ED Notes (Signed)
Pt. Repositioned

## 2019-09-02 NOTE — Discharge Instructions (Signed)
Steps to find a Primary Care Provider (PCP): ° °Call [phone] or [phone] to access "[facility] Find a Doctor Service." ° °2.  You may also go on the [facility] website at [email or web address] ° °3.  [facility] also frequently accepts new patients. ° ° [facility]  °[address] °[place] [address] °[phone] ° °4.  There are also multiple [facility], [facility] and [facility] practices throughout the [place] that are frequently accepting new patients. You may find a clinic that is close to your home and contact them. ° °[facility] °[email or web address] °[phone] ° °[facility] °[email or web address] ° °[facility] °[email or web address] °[phone] ° °[facility] °[email or web address] °[phone] ° °5.  Local Health Departments also can provide primary care services. ° °[facility]  °[address] °[place] [address] °[phone] ° °[facility] °[address] °[place] [address] °[phone] ° °[facility] °[address]  °[place] [address] °[phone] ° ° °

## 2019-09-02 NOTE — ED Triage Notes (Signed)
Patient originally called the police to his house for a dispute with someone; when the police got their they contacted EMS. (Per EMS, pt was to be arrested). Once in the EMS truck, pt states that he has been having CP for several weeks intermittently.

## 2019-10-14 ENCOUNTER — Other Ambulatory Visit: Payer: Self-pay

## 2019-10-14 ENCOUNTER — Emergency Department (HOSPITAL_COMMUNITY)
Admission: EM | Admit: 2019-10-14 | Discharge: 2019-10-14 | Disposition: A | Discharge status: Home / Self Care | Payer: Medicare Other | Attending: Emergency Medicine | Admitting: Emergency Medicine

## 2019-10-14 ENCOUNTER — Encounter (HOSPITAL_COMMUNITY): Payer: Self-pay | Admitting: Emergency Medicine

## 2019-10-14 ENCOUNTER — Emergency Department (HOSPITAL_COMMUNITY): Payer: Medicare Other

## 2019-10-14 DIAGNOSIS — I639 Cerebral infarction, unspecified: Secondary | ICD-10-CM | POA: Insufficient documentation

## 2019-10-14 DIAGNOSIS — Z79899 Other long term (current) drug therapy: Secondary | ICD-10-CM | POA: Diagnosis not present

## 2019-10-14 DIAGNOSIS — I2510 Atherosclerotic heart disease of native coronary artery without angina pectoris: Secondary | ICD-10-CM | POA: Insufficient documentation

## 2019-10-14 DIAGNOSIS — F1010 Alcohol abuse, uncomplicated: Secondary | ICD-10-CM | POA: Insufficient documentation

## 2019-10-14 DIAGNOSIS — F1410 Cocaine abuse, uncomplicated: Secondary | ICD-10-CM | POA: Diagnosis not present

## 2019-10-14 DIAGNOSIS — R0789 Other chest pain: Secondary | ICD-10-CM | POA: Diagnosis not present

## 2019-10-14 DIAGNOSIS — F17210 Nicotine dependence, cigarettes, uncomplicated: Secondary | ICD-10-CM | POA: Insufficient documentation

## 2019-10-14 DIAGNOSIS — R079 Chest pain, unspecified: Secondary | ICD-10-CM | POA: Diagnosis present

## 2019-10-14 LAB — HEPATIC FUNCTION PANEL
ALT: 31 U/L (ref 0–44)
AST: 37 U/L (ref 15–41)
Albumin: 3 g/dL — ABNORMAL LOW (ref 3.5–5.0)
Alkaline Phosphatase: 91 U/L (ref 38–126)
Bilirubin, Direct: 0.2 mg/dL (ref 0.0–0.2)
Indirect Bilirubin: 0.7 mg/dL (ref 0.3–0.9)
Total Bilirubin: 0.9 mg/dL (ref 0.3–1.2)
Total Protein: 6.2 g/dL — ABNORMAL LOW (ref 6.5–8.1)

## 2019-10-14 LAB — BASIC METABOLIC PANEL
Anion gap: 18 — ABNORMAL HIGH (ref 5–15)
BUN: 5 mg/dL — ABNORMAL LOW (ref 8–23)
CO2: 18 mmol/L — ABNORMAL LOW (ref 22–32)
Calcium: 8.7 mg/dL — ABNORMAL LOW (ref 8.9–10.3)
Chloride: 95 mmol/L — ABNORMAL LOW (ref 98–111)
Creatinine, Ser: 0.64 mg/dL (ref 0.61–1.24)
GFR calc Af Amer: >60 mL/min (ref >60)
GFR calc non Af Amer: >60 mL/min (ref >60)
Glucose, Bld: 85 mg/dL (ref 70–99)
Potassium: 4.5 mmol/L (ref 3.5–5.1)
Sodium: 131 mmol/L — ABNORMAL LOW (ref 135–145)

## 2019-10-14 LAB — CBC
HCT: 46.8 % (ref 39.0–52.0)
Hemoglobin: 16.1 g/dL (ref 13.0–17.0)
MCH: 37.8 pg — ABNORMAL HIGH (ref 26.0–34.0)
MCHC: 34.4 g/dL (ref 30.0–36.0)
MCV: 109.9 fL — ABNORMAL HIGH (ref 80.0–100.0)
Platelets: 167 10*3/uL (ref 150–400)
RBC: 4.26 MIL/uL (ref 4.22–5.81)
RDW: 12.6 % (ref 11.5–15.5)
WBC: 6 10*3/uL (ref 4.0–10.5)
nRBC: 0 % (ref 0.0–0.2)

## 2019-10-14 LAB — TROPONIN I (HIGH SENSITIVITY)
Troponin I (High Sensitivity): 9 ng/L (ref <18)
Troponin I (High Sensitivity): 8 ng/L (ref <18)

## 2019-10-14 LAB — LIPASE, BLOOD: Lipase: 28 U/L (ref 11–51)

## 2019-10-14 MED ORDER — SODIUM CHLORIDE 0.9% FLUSH: 3.0000 mL | Freq: Once | INTRAVENOUS | Status: DC

## 2019-10-14 NOTE — ED Provider Notes (Signed)
[facility] EMERGENCY DEPARTMENT Provider Note  CSN: [place]:[id number] Arrival date & time: 10/14/19 [id number]    History Chief Complaint  Patient presents with  . Chest Pain    HPI  [name] Pace is a 69 y.o. male presents for evaluation of chest pain described as sharp L lower chest pain, radiating into L arm associated with worsening of chronic SOB. He has had symptoms more or less continuously for 2-3 days. He drinks alcohol on a daily basis and additionally tried some cocaine last night to see if it would help his pain. It didn't help. He has a history of CAD with a CABGx3 in 2003. Has not had any recent MI/Cath/stent.    Past Medical History:  Diagnosis Date  . Alcohol abuse   . Anxiety   . Breast cancer ([place]) Left breast   Per patient diagnosed in early 74's.   . Cocaine abuse ([place]) 08/19/2018  . Coronary artery disease   . High cholesterol   . Left rib fracture 08/19/2018  . Prostate cancer ([place])    per patient diagnosed in early 20's  . Stroke [facility])     Past Surgical History:  Procedure Laterality Date  . CAROTID STENT    . CORONARY ARTERY BYPASS GRAFT    . FEMUR IM NAIL Left 07/02/2014   Procedure: INTRAMEDULLARY (IM) NAIL FEMORAL;  Surgeon: Elie Goody, MD;  Location: [facility];  Service: Orthopedics;  Laterality: Left;  . I & D EXTREMITY Right 02/10/2016   Procedure: IRRIGATION AND DEBRIDEMENT EXTREMITY;  Surgeon: Dorna Leitz, MD;  Location: [facility];  Service: Orthopedics;  Laterality: Right;  . TENDON REPAIR Right 02/10/2016   Procedure: Anterior TENDON REPAIR and wound exploration.;  Surgeon: Dorna Leitz, MD;  Location: [facility];  Service: Orthopedics;  Laterality: Right;    Family History  Problem Relation Age of Onset  . Dementia Mother     Social History   Tobacco Use  . Smoking status: Current Every Day Smoker    Packs/day: 1.00    Years: 42.00    Pack years: 42.00    Types: Cigarettes  . Smokeless tobacco: Never Used  Substance Use Topics  . Alcohol use:  Yes    Comment: 4-5 beers a day  . Drug use: No     Home Medications Prior to Admission medications   Medication Sig Start Date End Date Taking? Authorizing Provider  Multiple Vitamin (MULTIVITAMIN WITH MINERALS) TABS tablet Take 1 tablet by mouth daily. 04/13/19  Yes Aline August, MD  sodium chloride (OCEAN) 0.65 % SOLN nasal spray Place 1 spray into both nostrils as needed for congestion.   Yes [provider]  tiotropium (SPIRIVA HANDIHALER) 18 MCG inhalation capsule Place 1 capsule (18 mcg total) into inhaler and inhale daily. 04/13/19 05/13/19  Aline August, MD  albuterol (VENTOLIN HFA) 108 (90 Base) MCG/ACT inhaler Inhale 2 puffs into the lungs every 6 (six) hours as needed for wheezing or shortness of breath. Use 2 puffs 3 times daily x5 days, then every 6 hours as needed. Patient not taking: Reported on 10/14/2019 04/13/19 10/14/19  Aline August, MD  atorvastatin (LIPITOR) 40 MG tablet Take 1 tablet (40 mg total) by mouth daily at 6 PM. Patient not taking: Reported on 10/14/2019 04/13/19 10/14/19  Aline August, MD  lisinopril (ZESTRIL) 40 MG tablet Take 1 tablet (40 mg total) by mouth daily. Patient not taking: Reported on 10/14/2019 04/14/19 10/14/19  Aline August, MD  metoprolol tartrate (LOPRESSOR) 25 MG tablet  Take 1 tablet (25 mg total) by mouth 2 (two) times daily. Patient not taking: Reported on 10/14/2019 04/13/19 10/14/19  Aline August, MD  mometasone-formoterol (DULERA) 100-5 MCG/ACT AERO Inhale 2 puffs into the lungs 2 (two) times daily. Patient not taking: Reported on 10/14/2019 04/13/19 10/14/19  Aline August, MD     Allergies    Penicillins   Review of Systems   Review of Systems  Constitutional: Negative for fever.  HENT: Negative for congestion and sore throat.   Respiratory: Positive for shortness of breath. Negative for cough.   Cardiovascular: Positive for chest pain.  Gastrointestinal: Negative for abdominal pain, diarrhea, nausea and vomiting.    Genitourinary: Negative for dysuria.  Musculoskeletal: Negative for myalgias.  Skin: Negative for rash.  Neurological: Negative for headaches.  Psychiatric/Behavioral: Negative for behavioral problems.     Physical Exam BP (!) 178/101 (BP Location: Right Arm)   Pulse 77   Temp 98.1 F (36.7 C) (Oral)   Resp 15   Ht 6\' 2"  (1.88 m)   Wt 65.8 kg   SpO2 96%   BMI 18.62 kg/m   Physical Exam Vitals and nursing note reviewed.  Constitutional:      Appearance: Normal appearance.  HENT:     Head: Normocephalic and atraumatic.     Nose: Nose normal.     Mouth/Throat:     Mouth: Mucous membranes are moist.  Eyes:     Extraocular Movements: Extraocular movements intact.     Conjunctiva/sclera: Conjunctivae normal.  Cardiovascular:     Rate and Rhythm: Normal rate and regular rhythm.  Pulmonary:     Effort: Pulmonary effort is normal.     Breath sounds: Normal breath sounds.  Chest:     Chest wall: Tenderness (mild, L lower chest) present. No deformity.  Abdominal:     General: Abdomen is flat.     Palpations: Abdomen is soft.     Tenderness: There is no abdominal tenderness.  Musculoskeletal:        General: No swelling. Normal range of motion.     Cervical back: Neck supple.  Skin:    General: Skin is warm and dry.  Neurological:     General: No focal deficit present.     Mental Status: He is alert.  Psychiatric:        Mood and Affect: Mood normal.      ED Results / Procedures / Treatments   Labs (all labs ordered are listed, but only abnormal results are displayed) Labs Reviewed  BASIC METABOLIC PANEL - Abnormal; Notable for the following components:      Result Value   Sodium 131 (*)    Chloride 95 (*)    CO2 18 (*)    BUN 5 (*)    Calcium 8.7 (*)    Anion gap 18 (*)    All other components within normal limits  CBC - Abnormal; Notable for the following components:   MCV 109.9 (*)    MCH 37.8 (*)    All other components within normal limits  HEPATIC  FUNCTION PANEL - Abnormal; Notable for the following components:   Total Protein 6.2 (*)    Albumin 3.0 (*)    All other components within normal limits  LIPASE, BLOOD  TROPONIN I (HIGH SENSITIVITY)  TROPONIN I (HIGH SENSITIVITY)    EKG EKG Interpretation  Date/Time:  Monday Oct 14 2019 06:52:01 EDT Ventricular Rate:  82 PR Interval:  136 QRS Duration: 110 QT Interval:  395  QTC Calculation: 462 R Axis:   83 Text Interpretation: Sinus arrhythmia Borderline right axis deviation Anteroseptal infarct, old Repol abnrm, severe global ischemia (LM/MVD) No significant change since last tracing Confirmed by Calvert Cantor [phone]) on 10/14/2019 6:55:53 AM    Radiology DG Chest 2 View  Result Date: 10/14/2019 CLINICAL DATA:  Chest pain. EXAM: CHEST - 2 VIEW COMPARISON:  Two-view chest x-ray 09/02/2019 FINDINGS: Heart size is normal. Atherosclerotic calcifications are present at the aortic arch. CABG is noted. Lung volumes are low. Bibasilar airspace opacities are present, right greater than left. Healing right-sided rib fractures are noted. Axial skeleton is otherwise unremarkable. IMPRESSION: Low lung volumes and bibasilar airspace disease, right greater than left. While this may represent atelectasis, infection is not excluded. Electronically Signed   By: San Morelle M.D.   On: 10/14/2019 06:08    Procedures Procedures  Medications Ordered in the ED Medications  sodium chloride flush (NS) 0.9 % injection 3 mL (has no administration in time range)     ED Course  I have reviewed the triage vital signs and the nursing notes.  Pertinent labs & imaging results that were available during my care of the patient were reviewed by me and considered in my medical decision making (see chart for details).  Clinical Course as of Oct 13 956  Mon Oct 14, 2019  0935 Patient with complex medical history as well as substance abuse presents for atypical chest pain in setting of alcohol and  cocaine use. His EKG does not show any ischemic changes and Trop is neg x 2. Patient is feeling well and would like to go home, he states his daughter is planning to take him to a rehab facility in [place]. He was advised to avoid cocaine use and decrease his ETOH gradually to avoid withdrawal if he does not go to rehab.    [CS]    Clinical Course User Index [CS] Truddie Hidden, MD    MDM Rules/Calculators/A&P MDM  Final Clinical Impression(s) / ED Diagnoses Final diagnoses:  Atypical chest pain  Cocaine abuse [facility])  Alcohol abuse    Rx / DC Orders ED Discharge Orders    None       Truddie Hidden, MD 10/14/19 [phone]

## 2019-10-14 NOTE — ED Triage Notes (Signed)
Pt brought to Ed by [facility] from home for c/o cp for the past 4 days, hx of cocaine abuse last use last night. cbg 156, HR 80, BP 112/62, SPO2 95%.

## 2019-10-24 ENCOUNTER — Other Ambulatory Visit: Payer: Self-pay | Admitting: Physician Assistant

## 2019-10-24 ENCOUNTER — Other Ambulatory Visit: Payer: Self-pay

## 2019-10-24 ENCOUNTER — Inpatient Hospital Stay (HOSPITAL_COMMUNITY)
Admission: EM | Admit: 2019-10-24 | Discharge: 2019-11-07 | DRG: 896 | Disposition: A | Discharge status: Skilled Nursing Facility | Payer: Medicare Other | Attending: Internal Medicine | Admitting: Internal Medicine

## 2019-10-24 ENCOUNTER — Inpatient Hospital Stay (HOSPITAL_COMMUNITY): Payer: Medicare Other

## 2019-10-24 ENCOUNTER — Emergency Department (HOSPITAL_COMMUNITY): Payer: Medicare Other

## 2019-10-24 ENCOUNTER — Encounter (HOSPITAL_COMMUNITY): Payer: Self-pay

## 2019-10-24 DIAGNOSIS — Z20822 Contact with and (suspected) exposure to covid-19: Secondary | ICD-10-CM | POA: Diagnosis present

## 2019-10-24 DIAGNOSIS — I7300 Raynaud's syndrome without gangrene: Secondary | ICD-10-CM | POA: Diagnosis not present

## 2019-10-24 DIAGNOSIS — M25519 Pain in unspecified shoulder: Secondary | ICD-10-CM | POA: Diagnosis not present

## 2019-10-24 DIAGNOSIS — F329 Major depressive disorder, single episode, unspecified: Secondary | ICD-10-CM | POA: Diagnosis present

## 2019-10-24 DIAGNOSIS — M940 Chondrocostal junction syndrome [Tietze]: Secondary | ICD-10-CM | POA: Diagnosis present

## 2019-10-24 DIAGNOSIS — Z951 Presence of aortocoronary bypass graft: Secondary | ICD-10-CM

## 2019-10-24 DIAGNOSIS — Z8673 Personal history of transient ischemic attack (TIA), and cerebral infarction without residual deficits: Secondary | ICD-10-CM

## 2019-10-24 DIAGNOSIS — G8929 Other chronic pain: Secondary | ICD-10-CM | POA: Diagnosis present

## 2019-10-24 DIAGNOSIS — R079 Chest pain, unspecified: Secondary | ICD-10-CM

## 2019-10-24 DIAGNOSIS — E43 Unspecified severe protein-calorie malnutrition: Secondary | ICD-10-CM | POA: Diagnosis present

## 2019-10-24 DIAGNOSIS — R627 Adult failure to thrive: Secondary | ICD-10-CM | POA: Diagnosis not present

## 2019-10-24 DIAGNOSIS — R1314 Dysphagia, pharyngoesophageal phase: Secondary | ICD-10-CM | POA: Diagnosis present

## 2019-10-24 DIAGNOSIS — I771 Stricture of artery: Secondary | ICD-10-CM | POA: Diagnosis not present

## 2019-10-24 DIAGNOSIS — R64 Cachexia: Secondary | ICD-10-CM | POA: Diagnosis present

## 2019-10-24 DIAGNOSIS — R001 Bradycardia, unspecified: Secondary | ICD-10-CM | POA: Diagnosis not present

## 2019-10-24 DIAGNOSIS — G4089 Other seizures: Secondary | ICD-10-CM | POA: Diagnosis present

## 2019-10-24 DIAGNOSIS — F1020 Alcohol dependence, uncomplicated: Secondary | ICD-10-CM | POA: Diagnosis not present

## 2019-10-24 DIAGNOSIS — Z681 Body mass index (BMI) 19 or less, adult: Secondary | ICD-10-CM

## 2019-10-24 DIAGNOSIS — Z8546 Personal history of malignant neoplasm of prostate: Secondary | ICD-10-CM

## 2019-10-24 DIAGNOSIS — W19XXXA Unspecified fall, initial encounter: Secondary | ICD-10-CM | POA: Diagnosis not present

## 2019-10-24 DIAGNOSIS — R319 Hematuria, unspecified: Secondary | ICD-10-CM | POA: Diagnosis present

## 2019-10-24 DIAGNOSIS — K920 Hematemesis: Secondary | ICD-10-CM | POA: Diagnosis present

## 2019-10-24 DIAGNOSIS — F419 Anxiety disorder, unspecified: Secondary | ICD-10-CM | POA: Diagnosis present

## 2019-10-24 DIAGNOSIS — R933 Abnormal findings on diagnostic imaging of other parts of digestive tract: Secondary | ICD-10-CM

## 2019-10-24 DIAGNOSIS — I2510 Atherosclerotic heart disease of native coronary artery without angina pectoris: Secondary | ICD-10-CM | POA: Diagnosis present

## 2019-10-24 DIAGNOSIS — J439 Emphysema, unspecified: Secondary | ICD-10-CM | POA: Diagnosis present

## 2019-10-24 DIAGNOSIS — S2232XD Fracture of one rib, left side, subsequent encounter for fracture with routine healing: Secondary | ICD-10-CM

## 2019-10-24 DIAGNOSIS — Z853 Personal history of malignant neoplasm of breast: Secondary | ICD-10-CM

## 2019-10-24 DIAGNOSIS — R8271 Bacteriuria: Secondary | ICD-10-CM | POA: Diagnosis present

## 2019-10-24 DIAGNOSIS — I447 Left bundle-branch block, unspecified: Secondary | ICD-10-CM | POA: Diagnosis present

## 2019-10-24 DIAGNOSIS — K317 Polyp of stomach and duodenum: Secondary | ICD-10-CM | POA: Diagnosis present

## 2019-10-24 DIAGNOSIS — E7800 Pure hypercholesterolemia, unspecified: Secondary | ICD-10-CM | POA: Diagnosis present

## 2019-10-24 DIAGNOSIS — E872 Acidosis: Secondary | ICD-10-CM | POA: Diagnosis present

## 2019-10-24 DIAGNOSIS — M25562 Pain in left knee: Secondary | ICD-10-CM | POA: Diagnosis not present

## 2019-10-24 DIAGNOSIS — G92 Toxic encephalopathy: Secondary | ICD-10-CM | POA: Diagnosis not present

## 2019-10-24 DIAGNOSIS — Z880 Allergy status to penicillin: Secondary | ICD-10-CM | POA: Diagnosis not present

## 2019-10-24 DIAGNOSIS — K224 Dyskinesia of esophagus: Secondary | ICD-10-CM | POA: Diagnosis present

## 2019-10-24 DIAGNOSIS — R296 Repeated falls: Secondary | ICD-10-CM | POA: Diagnosis present

## 2019-10-24 DIAGNOSIS — T82856A Stenosis of peripheral vascular stent, initial encounter: Secondary | ICD-10-CM | POA: Diagnosis present

## 2019-10-24 DIAGNOSIS — F10231 Alcohol dependence with withdrawal delirium: Principal | ICD-10-CM | POA: Diagnosis present

## 2019-10-24 DIAGNOSIS — F4310 Post-traumatic stress disorder, unspecified: Secondary | ICD-10-CM | POA: Diagnosis present

## 2019-10-24 DIAGNOSIS — R5381 Other malaise: Secondary | ICD-10-CM | POA: Diagnosis present

## 2019-10-24 DIAGNOSIS — R0789 Other chest pain: Secondary | ICD-10-CM | POA: Diagnosis present

## 2019-10-24 DIAGNOSIS — F10239 Alcohol dependence with withdrawal, unspecified: Secondary | ICD-10-CM | POA: Diagnosis present

## 2019-10-24 DIAGNOSIS — N390 Urinary tract infection, site not specified: Secondary | ICD-10-CM | POA: Diagnosis present

## 2019-10-24 DIAGNOSIS — I739 Peripheral vascular disease, unspecified: Secondary | ICD-10-CM | POA: Diagnosis present

## 2019-10-24 DIAGNOSIS — R066 Hiccough: Secondary | ICD-10-CM | POA: Diagnosis present

## 2019-10-24 DIAGNOSIS — Z818 Family history of other mental and behavioral disorders: Secondary | ICD-10-CM

## 2019-10-24 DIAGNOSIS — F17210 Nicotine dependence, cigarettes, uncomplicated: Secondary | ICD-10-CM | POA: Diagnosis present

## 2019-10-24 DIAGNOSIS — R1011 Right upper quadrant pain: Secondary | ICD-10-CM

## 2019-10-24 DIAGNOSIS — I700 Atherosclerosis of aorta: Secondary | ICD-10-CM | POA: Diagnosis present

## 2019-10-24 DIAGNOSIS — Z9114 Patient's other noncompliance with medication regimen: Secondary | ICD-10-CM

## 2019-10-24 DIAGNOSIS — F1410 Cocaine abuse, uncomplicated: Secondary | ICD-10-CM | POA: Diagnosis present

## 2019-10-24 LAB — BASIC METABOLIC PANEL
Anion gap: 16 — ABNORMAL HIGH (ref 5–15)
BUN: 10 mg/dL (ref 8–23)
CO2: 23 mmol/L (ref 22–32)
Calcium: 8.2 mg/dL — ABNORMAL LOW (ref 8.9–10.3)
Chloride: 100 mmol/L (ref 98–111)
Creatinine, Ser: 0.68 mg/dL (ref 0.61–1.24)
GFR calc Af Amer: >60 mL/min (ref >60)
GFR calc non Af Amer: >60 mL/min (ref >60)
Glucose, Bld: 79 mg/dL (ref 70–99)
Potassium: 3.9 mmol/L (ref 3.5–5.1)
Sodium: 139 mmol/L (ref 135–145)

## 2019-10-24 LAB — CBC
HCT: 40.4 % (ref 39.0–52.0)
Hemoglobin: 13.9 g/dL (ref 13.0–17.0)
MCH: 36.7 pg — ABNORMAL HIGH (ref 26.0–34.0)
MCHC: 34.4 g/dL (ref 30.0–36.0)
MCV: 106.6 fL — ABNORMAL HIGH (ref 80.0–100.0)
Platelets: 222 10*3/uL (ref 150–400)
RBC: 3.79 MIL/uL — ABNORMAL LOW (ref 4.22–5.81)
RDW: 12.7 % (ref 11.5–15.5)
WBC: 6.4 10*3/uL (ref 4.0–10.5)
nRBC: 0 % (ref 0.0–0.2)

## 2019-10-24 LAB — HEPATIC FUNCTION PANEL
ALT: 36 U/L (ref 0–44)
AST: 67 U/L — ABNORMAL HIGH (ref 15–41)
Albumin: 3.1 g/dL — ABNORMAL LOW (ref 3.5–5.0)
Alkaline Phosphatase: 103 U/L (ref 38–126)
Bilirubin, Direct: 0.2 mg/dL (ref 0.0–0.2)
Indirect Bilirubin: 0.7 mg/dL (ref 0.3–0.9)
Total Bilirubin: 0.9 mg/dL (ref 0.3–1.2)
Total Protein: 6.1 g/dL — ABNORMAL LOW (ref 6.5–8.1)

## 2019-10-24 LAB — MAGNESIUM: Magnesium: 1.7 mg/dL (ref 1.7–2.4)

## 2019-10-24 LAB — LIPASE, BLOOD: Lipase: 31 U/L (ref 11–51)

## 2019-10-24 LAB — TROPONIN I (HIGH SENSITIVITY)
Troponin I (High Sensitivity): 14 ng/L (ref <18)
Troponin I (High Sensitivity): 10 ng/L (ref <18)

## 2019-10-24 LAB — PHOSPHORUS: Phosphorus: 3.5 mg/dL (ref 2.5–4.6)

## 2019-10-24 LAB — SARS CORONAVIRUS 2 BY RT PCR (HOSPITAL ORDER, PERFORMED IN ~~LOC~~ HOSPITAL LAB): SARS Coronavirus 2: NEGATIVE

## 2019-10-24 LAB — VITAMIN B12: Vitamin B-12: 188 pg/mL (ref 180–914)

## 2019-10-24 MED ORDER — LORAZEPAM 2 MG/ML IJ SOLN: 1.0000 mg | Freq: Once | INTRAMUSCULAR | Status: DC

## 2019-10-24 MED ORDER — ALUM & MAG HYDROXIDE-SIMETH 200-200-20 MG/5ML PO SUSP
30.0000 mL | Freq: Once | ORAL | Status: AC
  Administered 2019-10-24: 30 mL via ORAL
  Filled 2019-10-24: qty 30

## 2019-10-24 MED ORDER — DICLOFENAC SODIUM 1 % EX GEL
4.0000 g | Freq: Four times a day (QID) | CUTANEOUS | Status: DC
  Administered 2019-10-26 – 2019-11-06 (×28): 4 g via TOPICAL
  Filled 2019-10-24 (×2): qty 100

## 2019-10-24 MED ORDER — IOHEXOL 350 MG/ML SOLN
100.0000 mL | Freq: Once | INTRAVENOUS | Status: AC | PRN
  Administered 2019-10-24: 100 mL via INTRAVENOUS

## 2019-10-24 MED ORDER — ADULT MULTIVITAMIN W/MINERALS CH
1.0000 | ORAL_TABLET | Freq: Every day | ORAL | Status: DC
  Administered 2019-10-25 – 2019-11-07 (×12): 1 via ORAL
  Filled 2019-10-24 (×13): qty 1

## 2019-10-24 MED ORDER — PANTOPRAZOLE SODIUM 40 MG IV SOLR
40.0000 mg | Freq: Two times a day (BID) | INTRAVENOUS | Status: DC
  Administered 2019-10-24 – 2019-10-26 (×5): 40 mg via INTRAVENOUS
  Filled 2019-10-24 (×6): qty 40

## 2019-10-24 MED ORDER — LORAZEPAM 2 MG/ML IJ SOLN
0.0000 mg | INTRAMUSCULAR | Status: AC
  Administered 2019-10-25: 4 mg via INTRAVENOUS
  Administered 2019-10-25: 2 mg via INTRAVENOUS
  Filled 2019-10-24: qty 2
  Filled 2019-10-24: qty 1

## 2019-10-24 MED ORDER — LORAZEPAM 2 MG/ML IJ SOLN
2.0000 mg | Freq: Once | INTRAMUSCULAR | Status: AC
  Administered 2019-10-24: 2 mg via INTRAVENOUS
  Filled 2019-10-24: qty 1

## 2019-10-24 MED ORDER — LORAZEPAM 2 MG/ML IJ SOLN
1.0000 mg | INTRAMUSCULAR | Status: DC | PRN
  Administered 2019-10-28: 1 mg via INTRAVENOUS
  Administered 2019-10-29: 2 mg via INTRAVENOUS
  Filled 2019-10-24 (×2): qty 1

## 2019-10-24 MED ORDER — FOLIC ACID 5 MG/ML IJ SOLN
1.0000 mg | Freq: Every day | INTRAMUSCULAR | Status: DC
  Administered 2019-10-26: 1 mg via INTRAVENOUS
  Filled 2019-10-24 (×5): qty 0.2

## 2019-10-24 MED ORDER — LORAZEPAM 1 MG PO TABS
1.0000 mg | ORAL_TABLET | ORAL | Status: DC | PRN
  Administered 2019-10-25: 4 mg via ORAL
  Administered 2019-10-26 – 2019-10-27 (×2): 2 mg via ORAL
  Administered 2019-10-27 – 2019-10-28 (×3): 4 mg via ORAL
  Filled 2019-10-24: qty 2
  Filled 2019-10-24 (×4): qty 4
  Filled 2019-10-24: qty 2
  Filled 2019-10-24: qty 4

## 2019-10-24 MED ORDER — SODIUM CHLORIDE 0.9% FLUSH
3.0000 mL | Freq: Two times a day (BID) | INTRAVENOUS | Status: DC
  Administered 2019-10-24 – 2019-11-05 (×16): 3 mL via INTRAVENOUS

## 2019-10-24 MED ORDER — LIDOCAINE VISCOUS HCL 2 % MT SOLN
15.0000 mL | Freq: Once | OROMUCOSAL | Status: AC
  Administered 2019-10-24: 15 mL via ORAL
  Filled 2019-10-24: qty 15

## 2019-10-24 MED ORDER — UMECLIDINIUM BROMIDE 62.5 MCG/INH IN AEPB
1.0000 | INHALATION_SPRAY | Freq: Every day | RESPIRATORY_TRACT | Status: DC
  Administered 2019-10-25 – 2019-11-04 (×3): 1 via RESPIRATORY_TRACT
  Filled 2019-10-24: qty 7

## 2019-10-24 MED ORDER — THIAMINE HCL 100 MG/ML IJ SOLN
100.0000 mg | Freq: Every day | INTRAMUSCULAR | Status: DC
  Administered 2019-10-25 – 2019-10-26 (×2): 100 mg via INTRAVENOUS
  Filled 2019-10-24 (×4): qty 2

## 2019-10-24 MED ORDER — THIAMINE HCL 100 MG/ML IJ SOLN
Freq: Once | INTRAVENOUS | Status: AC
  Filled 2019-10-24 (×2): qty 1000

## 2019-10-24 MED ORDER — LORAZEPAM 2 MG/ML IJ SOLN
0.0000 mg | Freq: Three times a day (TID) | INTRAMUSCULAR | Status: AC
  Administered 2019-10-26: 2 mg via INTRAVENOUS
  Administered 2019-10-27: 4 mg via INTRAVENOUS
  Administered 2019-10-27 – 2019-10-28 (×2): 2 mg via INTRAVENOUS
  Filled 2019-10-24 (×3): qty 1
  Filled 2019-10-24: qty 2

## 2019-10-24 MED ORDER — SODIUM CHLORIDE 0.9% FLUSH: 3.0000 mL | Freq: Once | INTRAVENOUS | Status: DC

## 2019-10-24 MED ORDER — ALBUTEROL SULFATE HFA 108 (90 BASE) MCG/ACT IN AERS
2.0000 | INHALATION_SPRAY | Freq: Once | RESPIRATORY_TRACT | Status: AC
  Administered 2019-10-24: 2 via RESPIRATORY_TRACT
  Filled 2019-10-24: qty 6.7

## 2019-10-24 MED ORDER — CHLORDIAZEPOXIDE HCL 25 MG PO CAPS
50.0000 mg | ORAL_CAPSULE | Freq: Four times a day (QID) | ORAL | Status: AC
  Administered 2019-10-24 – 2019-10-25 (×4): 50 mg via ORAL
  Filled 2019-10-24 (×4): qty 2

## 2019-10-24 MED ORDER — ONDANSETRON HCL 4 MG/2ML IJ SOLN: 4.0000 mg | Freq: Four times a day (QID) | INTRAMUSCULAR | Status: DC | PRN

## 2019-10-24 MED ORDER — ACETAMINOPHEN 325 MG PO TABS: 650.0000 mg | ORAL_TABLET | Freq: Four times a day (QID) | ORAL | Status: DC | PRN

## 2019-10-24 MED ORDER — ONDANSETRON HCL 4 MG PO TABS: 4.0000 mg | ORAL_TABLET | Freq: Four times a day (QID) | ORAL | Status: DC | PRN

## 2019-10-24 NOTE — ED Triage Notes (Signed)
Pt comes via [facility] for CP, central, worse with deep breath, pain goes on everyday. Hx of bypass, PTA received 324 ASA

## 2019-10-24 NOTE — Progress Notes (Signed)
   10/24/19 1458  Assess: MEWS Score  Temp 98.6 F (37 C)  BP (!) 156/82  Pulse Rate 91  ECG Heart Rate 88  Resp (!) 22  Level of Consciousness Responds to Voice  SpO2 99 %  O2 Device Nasal Cannula  O2 Flow Rate (L/min) 1.5 L/min  Assess: MEWS Score  MEWS Temp 0  MEWS Systolic 0  MEWS Pulse 0  MEWS RR 2  MEWS LOC 1  MEWS Score 3  MEWS Score Color Yellow  Assess: if the MEWS score is Yellow or Red  Were vital signs taken at a resting state? Yes  Focused Assessment Documented focused assessment  Early Detection of Sepsis Score *See Row Information* Low  MEWS guidelines implemented *See Row Information* No, previously yellow, continue vital signs every 4 hours  Treat  MEWS Interventions Other (Comment) (patient just arrived to floor; continue to monitor)  Take Vital Signs  Increase Vital Sign Frequency  Yellow: Q 2hr X 2 then Q 4hr X 2, if remains yellow, continue Q 4hrs  Escalate  MEWS: Escalate Yellow: discuss with charge nurse/RN and consider discussing with provider and RRT  Notify: Charge Nurse/RN  Name of Charge Nurse/RN Notified Nona Dell  Date Charge Nurse/RN Notified 10/24/19  Time Charge Nurse/RN Notified 1458  Document  Patient Outcome Other (Comment) (new arrival to floor; continue to monitor)  Progress note created (see row info) Yes

## 2019-10-24 NOTE — Hospital Course (Addendum)
Admitted 10/24/2019  Allergies: Penicillins Pertinent Hx: Cocaine abuse, CAD s/p CABG, EtOH, anxiety, CVA   69 y.o. male p/w chest pain  * Chest pain: Substernal, sharp, radiates down hands, worse with vomiting, non-exertional. Tropoinin 14 >10. EKG unchanged. No recent cocaine use. VSS. CP had resolved but then came back. Unlikely cardiac. Lidocaine patch.   *Hematemesis: Emesis x 3-4 days, abdominal pain, nausea, frequent EtOH use, no hx of EGDs. VSS. Hgb 13 (from 16). Monitoring.   *EtOH withdrawal: hx of seizures and ICU admission, last drink 2 days ago, drinks up to 1 pint per day. Tremors, agitated, nauseous on exam. CIWA 5. CIWA with ativan  Consults: None  Meds: Ativan, lidocaine patch VTE ppx: SCDs IVF: NS Diet: NPO

## 2019-10-24 NOTE — ED Provider Notes (Signed)
[facility] EMERGENCY DEPARTMENT Provider Note   CSN: [id number] Arrival date & time: 10/24/19  0602     History Chief Complaint  Patient presents with  . Chest Pain    [name] Pace is a 69 y.o. male.  The history is provided by the patient and medical records. No language interpreter was used.  Chest Pain    69 year old male with known history of cocaine abuse, CAD, alcohol abuse, anxiety, stroke brought here via EMS from home for evaluation of chest pain.  Patient is a poor historian.  Patient states he has had recurrent midsternal chest pain on and off for at least a year.  Pain is described as a sharp sensation, brought on with exertion, associate with nausea, waxing waning, but moderate in severity at this time.  He endorsed a chronic cough.  He admits to heavy alcohol use, last use was today.  He endorsed having recurrent hiccups.  He admits to using cocaine 10 days ago during his last ER visit but none since.  Reported history of cardiac disease including a triple bypass but have not seen his cardiologist in quite a while.  He endorsed having shortness of breath with exertion but this is chronic.  States he mostly stay at home and "minding my own business".  He has not had his Covid vaccination.  Report using aspirin as needed for his pain.  Past Medical History:  Diagnosis Date  . Alcohol abuse   . Anxiety   . Breast cancer ([place]) Left breast   Per patient diagnosed in early 32's.   . Cocaine abuse ([place]) 08/19/2018  . Coronary artery disease   . High cholesterol   . Left rib fracture 08/19/2018  . Prostate cancer ([place])    per patient diagnosed in early 69's  . Stroke [facility])     Patient Active Problem List   Diagnosis Date Noted  . Elevated troponin   . Severe alcohol use disorder ([place])   . Abnormal echocardiogram 12/02/2018  . Compression fracture of T6 vertebra (HCC)   . Compression fracture of T7 vertebra (HCC)   . Compression fracture of T8  vertebra ([place])   . COPD exacerbation ([place]) 12/01/2018  . T6 vertebral fracture ([place]) 12/01/2018  . T7 vertebral fracture ([place]) 12/01/2018  . T8 vertebral fracture ([place]) 12/01/2018  . Cocaine abuse ([place]) 08/19/2018  . Polysubstance abuse ([place]) 08/19/2018  . Left rib fracture 08/19/2018  . Potomania 08/19/2018  . Alcohol abuse with intoxication ([place]) 08/19/2018  . At risk for adverse drug event 06/12/2018  . Palliative care encounter   . Encephalopathy acute   . Delirium tremens ([place]) 05/18/2018  . Malnutrition of moderate degree 05/18/2018  . Acute respiratory failure ([place])   . Shock circulatory ([place])   . Multiple rib fractures 05/14/2018  . Pneumothorax, closed, traumatic, initial encounter 05/14/2018  . Traumatic fracture of ribs with pneumothorax   . Laceration of muscle(s) and tendon(s) of anterior muscle group at lower leg level, right leg, initial encounter 02/10/2016  . Traumatic rupture of right anterior tibial tendon 02/10/2016  . Hip fracture, left ([place]) 07/02/2014  . Closed left hip fracture ([employer]) 07/02/2014  . Hip fracture ([place]) 07/02/2014  . Intertrochanteric fracture of left femur ([place]) 07/02/2014  . History of stroke   . Preop examination   . Subclavian artery stenosis, left ([place]) 06/15/2014  . Chest pain 06/14/2014  . CAP (community acquired pneumonia) 06/14/2014  . Sepsis ([place]) 06/14/2014  . Alcohol abuse 06/14/2014  .  CAD (coronary artery disease) 06/14/2014  . Tobacco abuse 06/14/2014    Past Surgical History:  Procedure Laterality Date  . CAROTID STENT    . CORONARY ARTERY BYPASS GRAFT    . FEMUR IM NAIL Left 07/02/2014   Procedure: INTRAMEDULLARY (IM) NAIL FEMORAL;  Surgeon: Elie Goody, MD;  Location: [facility];  Service: Orthopedics;  Laterality: Left;  . I & D EXTREMITY Right 02/10/2016   Procedure: IRRIGATION AND DEBRIDEMENT EXTREMITY;  Surgeon: Dorna Leitz, MD;  Location: [facility];  Service: Orthopedics;  Laterality: Right;  . TENDON REPAIR Right  02/10/2016   Procedure: Anterior TENDON REPAIR and wound exploration.;  Surgeon: Dorna Leitz, MD;  Location: [facility];  Service: Orthopedics;  Laterality: Right;       Family History  Problem Relation Age of Onset  . Dementia Mother     Social History   Tobacco Use  . Smoking status: Current Every Day Smoker    Packs/day: 1.00    Years: 42.00    Pack years: 42.00    Types: Cigarettes  . Smokeless tobacco: Never Used  Substance Use Topics  . Alcohol use: Yes    Comment: 4-5 beers a day  . Drug use: Yes    Types: Cocaine    Home Medications Prior to Admission medications   Medication Sig Start Date End Date Taking? Authorizing Provider  Multiple Vitamin (MULTIVITAMIN WITH MINERALS) TABS tablet Take 1 tablet by mouth daily. 04/13/19   Aline August, MD  sodium chloride (OCEAN) 0.65 % SOLN nasal spray Place 1 spray into both nostrils as needed for congestion.    [provider]  tiotropium (SPIRIVA HANDIHALER) 18 MCG inhalation capsule Place 1 capsule (18 mcg total) into inhaler and inhale daily. 04/13/19 05/13/19  Aline August, MD  albuterol (VENTOLIN HFA) 108 (90 Base) MCG/ACT inhaler Inhale 2 puffs into the lungs every 6 (six) hours as needed for wheezing or shortness of breath. Use 2 puffs 3 times daily x5 days, then every 6 hours as needed. Patient not taking: Reported on 10/14/2019 04/13/19 10/14/19  Aline August, MD  atorvastatin (LIPITOR) 40 MG tablet Take 1 tablet (40 mg total) by mouth daily at 6 PM. Patient not taking: Reported on 10/14/2019 04/13/19 10/14/19  Aline August, MD  lisinopril (ZESTRIL) 40 MG tablet Take 1 tablet (40 mg total) by mouth daily. Patient not taking: Reported on 10/14/2019 04/14/19 10/14/19  Aline August, MD  metoprolol tartrate (LOPRESSOR) 25 MG tablet Take 1 tablet (25 mg total) by mouth 2 (two) times daily. Patient not taking: Reported on 10/14/2019 04/13/19 10/14/19  Aline August, MD  mometasone-formoterol (DULERA) 100-5 MCG/ACT AERO Inhale  2 puffs into the lungs 2 (two) times daily. Patient not taking: Reported on 10/14/2019 04/13/19 10/14/19  Aline August, MD    Allergies    Penicillins  Review of Systems   Review of Systems  Cardiovascular: Positive for chest pain.  All other systems reviewed and are negative.   Physical Exam Updated Vital Signs BP (!) (P) 166/92   Pulse (P) 78   Temp 97.7 F (36.5 C) (Oral)   Resp (!) (P) 32   SpO2 (P) 100%   Physical Exam Vitals and nursing note reviewed.  Constitutional:      General: He is not in acute distress.    Comments: Chronically ill-appearing male, nontoxic in appearance  HENT:     Head: Atraumatic.  Eyes:     Conjunctiva/sclera: Conjunctivae normal.  Cardiovascular:     Rate and Rhythm:  Normal rate and regular rhythm.     Pulses: Normal pulses.     Heart sounds: Normal heart sounds.  Pulmonary:     Effort: Pulmonary effort is normal.     Comments: Scattered rhonchi heard without overt wheezes of crackles. Abdominal:     Palpations: Abdomen is soft.     Tenderness: There is abdominal tenderness (Mild epigastric tenderness no guarding rebound tenderness.).  Musculoskeletal:     Cervical back: Neck supple.     Right lower leg: No edema.     Left lower leg: No edema.     Comments: Globally weak with poor effort.  Skin:    Findings: No rash.  Neurological:     Mental Status: He is alert. Mental status is at baseline.  Psychiatric:        Mood and Affect: Mood normal.     ED Results / Procedures / Treatments   Labs (all labs ordered are listed, but only abnormal results are displayed) Labs Reviewed  BASIC METABOLIC PANEL - Abnormal; Notable for the following components:      Result Value   Calcium 8.2 (*)    Anion gap 16 (*)    All other components within normal limits  CBC - Abnormal; Notable for the following components:   RBC 3.79 (*)    MCV 106.6 (*)    MCH 36.7 (*)    All other components within normal limits  SARS CORONAVIRUS 2 BY RT  PCR (HOSPITAL ORDER, [facility] [facility])  TROPONIN I (HIGH SENSITIVITY)  TROPONIN I (HIGH SENSITIVITY)    EKG EKG Interpretation  Date/Time:  Thursday Oct 24 2019 06:14:20 EDT Ventricular Rate:  75 PR Interval:    QRS Duration: 116 QT Interval:  428 QTC Calculation: 479 R Axis:   77 Text Interpretation: Sinus rhythm Incomplete left bundle branch block ST depression, consider ischemia, diffuse lds When compared with ECG of 10/14/2019, No significant change was found Confirmed by Delora Fuel ([id number]) on 10/24/2019 6:27:42 AM   Radiology DG Chest 2 View  Result Date: 10/24/2019 CLINICAL DATA:  Shortness of breath and central chest pain for over a year EXAM: CHEST - 2 VIEW COMPARISON:  Ten days ago FINDINGS: Normal heart size and mediastinal contours. CABG and coronary/subclavian stenting. Posttraumatic deformity of right lower ribs with subjacent pleuroparenchymal opacity and architectural distortion. There is no edema, consolidation, effusion, or pneumothorax. Nipple shadow noted on the left. Remote glenoid fracture and repair on the right. Chronic midthoracic compression fractures. IMPRESSION: 1. No evidence of acute disease. 2. Pleuroparenchymal scarring at the right base. Electronically Signed   By: Monte Fantasia M.D.   On: 10/24/2019 07:21    Procedures Procedures (including critical care time)  Medications Ordered in ED Medications  sodium chloride flush (NS) 0.9 % injection 3 mL (3 mLs Intravenous Not Given 10/24/19 0625)  alum & mag hydroxide-simeth (MAALOX/MYLANTA) 200-200-20 MG/5ML suspension 30 mL (30 mLs Oral Given 10/24/19 0754)    And  lidocaine (XYLOCAINE) 2 % viscous mouth solution 15 mL (15 mLs Oral Given 10/24/19 0754)  albuterol (VENTOLIN HFA) 108 (90 Base) MCG/ACT inhaler 2 puff (2 puffs Inhalation Given 10/24/19 0754)    ED Course  I have reviewed the triage vital signs and the nursing notes.  Pertinent labs & imaging results that were available  during my care of the patient were reviewed by me and considered in my medical decision making (see chart for details).    MDM Rules/Calculators/A&P  BP 126/73   Pulse 89   Temp 97.7 F (36.5 C) (Oral)   Resp 18   SpO2 98%   Final Clinical Impression(s) / ED Diagnoses Final diagnoses:  Nonspecific chest pain    Rx / DC Orders ED Discharge Orders    None     7:33 AM Patient with recurrent midsternal chest pain ongoing for the past year.  Has been seen in the ED several times for his chest pain which is atypical in presentation.  Heavy alcohol use as well as cocaine use.  Suspect chest pain is more likely GERD/gastritis from regular NSAID use as well as alcohol use.  Does have significant cardiac history.  11:39 AM HEART score of 6, moderate risk.  Haven't had f/u with cardiology since last ER visit.  Has poor transportation.  Due to moderate risk and lack of outpt f/u will consult for admission for chest pain rule out. Pt is pain free.  12:45 PM Appreciate consultation from Internal Medicine resident who agrees to see and admit pt.  covid-19 screening test ordered.   PER NOTARO was evaluated in Emergency Department on 10/24/2019 for the symptoms described in the history of present illness. He was evaluated in the context of the global COVID-19 pandemic, which necessitated consideration that the patient might be at risk for infection with the SARS-CoV-2 virus that causes COVID-19. Institutional protocols and algorithms that pertain to the evaluation of patients at risk for COVID-19 are in a state of rapid change based on information released by regulatory bodies including the CDC and federal and state organizations. These policies and algorithms were followed during the patient's care in the ED.    Domenic Moras, PA-C [id number]    Delora Fuel, MD [id number] [phone]

## 2019-10-24 NOTE — H&P (Signed)
NAME:  [name] Pace, MRN:  [id number], DOB:  [birth year], LOS: 0 ADMISSION DATE:  10/24/2019, Primary: Patient, No Pcp Per  CHIEF COMPLAINT:  Chest pain   Medical Service: Internal Medicine Teaching Service         Attending Physician: Dr. Lucious Groves, DO    First Contact: Dr. Darrick Meigs Pager: [phone]  Second Contact: Dr. Jerl Mina Pager: [phone]       After Hours (After 5p/  First Contact Pager: [phone]  weekends / holidays): Second Contact Pager: [phone]    History of present illness   69 yo chronically ill male with ICM , CAD, alcohol dependence with history of DTs/withdrawl seizures. He presented to [facility] on 10/24[facility]19 for 2d hx of intermittent chest pain, shortness of breath, nausea, hematemesis, and epigastric pain.  When seen in the ED, he is disheveled appearing with overall poor hygeine. Cachetic appearing. He has fairly significant tremors in his hands. Pt is primary historian. He notes that his brother is also available for questions and has been working to get him into [employer]. He also has a daughter that resides in [place]. He notes that all other family has since passed away.  Pt notes that chest pain began about 2d ago. It is substernal and sharp in nature. Radiates down his arms to his hands causing, what he describes as, a "claw hand".  Intermittent and unrelated to activity. Implies worsening with vomiting. Non-pleuritic in nature. Denies trauma to the chest. Denies palpitations or lightheadedness. He does note feeling anxious at time of exam however chest pain began prior to that. Denies similar chest pain in the past however chart review does indicate that he was seen for similar issue 10d prior to admission.   Pt endorses a fairly significant cardiac history including CABG in 2009, prior CVAs, PVD.   Of note, he relays prior episodes of hematemesis in the past (unclear time frame) but has never had it worked up. He notes that it is a bright red in color of an  unspecified quantitiy.   He also notes hematuria that has been intermittent for at least the past 2 years. This is also bright red without clots. Denies dysuria. Denies other urinary sx.   Regarding his substance use history, pt notes drinking a pint of hard alcohol a day to cope with depression related to his time in war in [place]. He has struggled with this since that time. He has a history of severe alcohol withdrawal symptoms including DTs and seizure.  ED course  Workup in the ED was essentially unremarkable. Troponins flat. No ischemic changes on EKG. Hgb stable. Mild AGMA. Chest pain improved while in the ED.   Past Medical History  He,  has a past medical history of Alcohol abuse, Anxiety, Breast cancer ([place]) (Left breast), Cocaine abuse ([place]) (08/19/2018), Coronary artery disease, High cholesterol, Left rib fracture (08/19/2018), Prostate cancer ([place]), and Stroke ([place]).   Home Medications    Asprin 81mg   Allergies    Allergies as of 10/24/2019 - Review Complete 10/24/2019  Allergen Reaction Noted  . Penicillins Anaphylaxis 12/10/2013    Social History   reports that he has been smoking cigarettes. He has a 42.00 pack-year smoking history. He has never used smokeless tobacco. He reports current alcohol use. He reports current drug use. Drug: Cocaine.   Family History   His family history includes Dementia in his mother.   ROS  Review of Systems  Constitutional: Positive for chills, malaise/fatigue and weight  loss. Negative for fever.  HENT: Negative.   Respiratory: Positive for shortness of breath. Negative for cough and hemoptysis.   Cardiovascular: Positive for chest pain. Negative for palpitations and leg swelling.  Gastrointestinal: Positive for abdominal pain, nausea and vomiting. Negative for blood in stool and diarrhea.  Genitourinary: Positive for hematuria. Negative for dysuria and urgency.  Musculoskeletal: Positive for falls.  Skin: Negative for itching and  rash.  Neurological: Positive for tremors and weakness. Negative for seizures and loss of consciousness.  Endo/Heme/Allergies: Negative.   Psychiatric/Behavioral: Positive for depression, memory loss and substance abuse. The patient is nervous/anxious and has insomnia.     Objective   Blood pressure (!) 151/73, pulse 90, temperature 97.7 F (36.5 C), temperature source Oral, resp. rate 20, SpO2 96 %.      Examination: GENERAL: chronically ill and cachetic appering. Poor hygeine. HEENT: head atraumatic. No oropharyngeal erythema or lesions appreciated CARDIAC: heart RRR. No peripheral edema.  PULMONARY: breathing comfortably on room air. Diminished lung sounds throughout. No wheezing ABDOMEN: bs active. Mildly distended. Diffuse tenderness to palpation that is more prominent in the epigastric region NEURO: A/Ox3. Fairly significant upper extremity tremors. Moving all extremities. No focal deficits appreciated SKIN: no rash or lesions on limited exam. No diaphoresis.  PSYCH: anxious affect  Consults:  none  Significant Diagnostic Tests:  EKG: rate: 75 QRS: 117ms QTc 479.   regular rate and rhythm. Difficult to ascertain if there are p waves present.   CXR: hyperinflated lungs. Right sided pleuroparenchymal scarring  Labs    CBC Latest Ref Rng & Units 10/24/2019 10/14/2019 09/02/2019  WBC 4.0 - 10.5 K/uL 6.4 6.0 5.7  Hemoglobin 13.0 - 17.0 g/dL 13.9 16.1 12.9(L)  Hematocrit 39.0 - 52.0 % 40.4 46.8 37.7(L)  Platelets 150 - 400 K/uL 222 167 267   BMP Latest Ref Rng & Units 10/24/2019 10/14/2019 09/02/2019  Glucose 70 - 99 mg/dL 79 85 93  BUN 8 - 23 mg/dL 10 5(L) <5(L)  Creatinine 0.61 - 1.24 mg/dL 0.68 0.64 0.56(L)  Sodium 135 - 145 mmol/L 139 131(L) 131(L)  Potassium 3.5 - 5.1 mmol/L 3.9 4.5 3.5  Chloride 98 - 111 mmol/L 100 95(L) 97(L)  CO2 22 - 32 mmol/L 23 18(L) 22  Calcium 8.9 - 10.3 mg/dL 8.2(L) 8.7(L) 7.8(L)     Summary  69 yo male with CAD s/p CABG (2009), alcohol  dependence with history of DTs/withdrawl seizures, history of CVAs, and persistent anxiety. Admitted to IMTS on 10/24/19 for further evaluation and monitoring of chest pain, hematemesis, hematuria and alcohol withdrawal. ACS ruled out diagnostically in ED.  Assessment & Plan:  Active Problems:   Atypical chest pain  Atypical chest pain (sharp, substernal, intermittent) with concomitant epigastric pain.  ACS effectively ruled out diagnostically in the ED.  Given his 2d history of n/v/abdominal pain, suspect that chest pain is likely musculoskeletal as he does have a little more discomfort with palpation. Chart review indicates chronic pain in the right costal region from prior fractures (costochondritis?).  Can not completely rule out aortic dissection at this time. Does have significant cardiac history and history of cocaine use. Otherwise vitals appear stable.  Low suspecion for pulm etiology given his relatively benign CXR. Suspect a component may also be gastritis/esophagitis related to ETOH use/vomitting. Lipase normal so less likely pancreatitis.  Plan --Given his increased risk for dissection and decreased sensitivity of D-dimer in such cases, will obtain a CTA chest to r/o dissection --Tele monitoring --UDS to evaluate for  presence of cocaine that might be contributing to chest pain --PRN voltaren gel to affected area   Epigastric abdominal pain with hematemesis. Given his chronic alcohol use, high suspicion for gastritis/esophagitis. Abdominal CT from last year did not comment on cirrhosis and AST just minimally elevated so fairly unlikely cirrhosis. Slight decrease in hgb 16>14 over 10d.  Plan --Will need evaluation by GI at some point whether inpatient or outpatient depending on how his withdrawls progress and if he continues to have hematemesis. If no further hematemesis this hospitalization, I think it would be reasonable to have evaluation done outpatient. --PPI BID --Daily cbc    --NPO  Asymptomatic hematuria. ~2y hx per pt.  No prior eval done. CT a/p from last year does comment on a thickened bladder wall that was presumedly cystitis. Chart review does indicate a history of prostate cancer however unclear how that was treated. Plan --UA --Bladder scan if no voids in 8h --Will likely need an outpatient evaluation at time of discharge  Alcohol dependence with history of DTs/withdrawl seizures. Pt admits to drinking roughly 1 pint of hard alcohol daily. Chart review indicates a fairly significant history of alcohol withdrawal while hospitalized resulting in intubation.  Macrocytosis without anemia likely 2/2 to etoh. Plan --Stepdown CIWA with ativan. Low threshold for transfer to PCCM if he begins to decompensate, given his significant withdrawal history --seizure precautions --Banana bag --folate/thiamine supplementation starting tomorrow  Recurrent falls. Likely multifactorial in setting of ETOH use, malnutrition, deconditioning. Plan: check B12. PT/OT eval.  High Risk Malnutrition. NPO at this time due to seizure precautions. Nutrition consult for recommendations once back on regular diet.  Best practice:  CODE STATUS:Full Diet: NPO Pain management: n/a GI prophylaxis: PPI BID DVT for prophylaxis: SCDs. Hold pharmaceutical prophylaxis for active bleeding Social considerations/Family communication: will try to update family tomorrow Dispo: Admit patient to Inpatient with expected length of stay greater than 2 midnights.   Mitzi Hansen, MD INTERNAL MEDICINE RESIDENT PGY-1 Pager # [phone]. Please page after hours pager after 5 pm [phone] 10/24/19  2:27 PM

## 2019-10-24 NOTE — Progress Notes (Signed)
CSW spoke with patients daughter and provided her with resources for outpatient substance use treatment services for patient.  CSW will continue to follow for any other patient needs.

## 2019-10-24 NOTE — ED Notes (Signed)
Attempted IV stick x 2  

## 2019-10-24 NOTE — ED Notes (Signed)
This RN attempted IV access twice, unsuccessful both times.

## 2019-10-25 DIAGNOSIS — R0789 Other chest pain: Secondary | ICD-10-CM

## 2019-10-25 DIAGNOSIS — I700 Atherosclerosis of aorta: Secondary | ICD-10-CM | POA: Diagnosis present

## 2019-10-25 DIAGNOSIS — J439 Emphysema, unspecified: Secondary | ICD-10-CM | POA: Diagnosis present

## 2019-10-25 DIAGNOSIS — I771 Stricture of artery: Secondary | ICD-10-CM

## 2019-10-25 LAB — COMPREHENSIVE METABOLIC PANEL
ALT: 28 U/L (ref 0–44)
AST: 36 U/L (ref 15–41)
Albumin: 2.7 g/dL — ABNORMAL LOW (ref 3.5–5.0)
Alkaline Phosphatase: 87 U/L (ref 38–126)
Anion gap: 10 (ref 5–15)
BUN: 10 mg/dL (ref 8–23)
CO2: 24 mmol/L (ref 22–32)
Calcium: 7.8 mg/dL — ABNORMAL LOW (ref 8.9–10.3)
Chloride: 104 mmol/L (ref 98–111)
Creatinine, Ser: 0.7 mg/dL (ref 0.61–1.24)
GFR calc Af Amer: >60 mL/min (ref >60)
GFR calc non Af Amer: >60 mL/min (ref >60)
Glucose, Bld: 115 mg/dL — ABNORMAL HIGH (ref 70–99)
Potassium: 3.7 mmol/L (ref 3.5–5.1)
Sodium: 138 mmol/L (ref 135–145)
Total Bilirubin: 1.1 mg/dL (ref 0.3–1.2)
Total Protein: 5.3 g/dL — ABNORMAL LOW (ref 6.5–8.1)

## 2019-10-25 LAB — CBC
HCT: 35.5 % — ABNORMAL LOW (ref 39.0–52.0)
Hemoglobin: 12.1 g/dL — ABNORMAL LOW (ref 13.0–17.0)
MCH: 35.9 pg — ABNORMAL HIGH (ref 26.0–34.0)
MCHC: 34.1 g/dL (ref 30.0–36.0)
MCV: 105.3 fL — ABNORMAL HIGH (ref 80.0–100.0)
Platelets: 195 10*3/uL (ref 150–400)
RBC: 3.37 MIL/uL — ABNORMAL LOW (ref 4.22–5.81)
RDW: 12.8 % (ref 11.5–15.5)
WBC: 5.5 10*3/uL (ref 4.0–10.5)
nRBC: 0 % (ref 0.0–0.2)

## 2019-10-25 MED ORDER — ENSURE ENLIVE PO LIQD
237.0000 mL | Freq: Three times a day (TID) | ORAL | Status: DC
  Administered 2019-10-25 – 2019-11-06 (×19): 237 mL via ORAL

## 2019-10-25 MED ORDER — CHLORDIAZEPOXIDE HCL 25 MG PO CAPS
25.0000 mg | ORAL_CAPSULE | Freq: Three times a day (TID) | ORAL | Status: AC
  Administered 2019-10-25 – 2019-10-27 (×6): 25 mg via ORAL
  Filled 2019-10-25 (×6): qty 1

## 2019-10-25 MED ORDER — VITAMIN B-12 1000 MCG PO TABS
1000.0000 ug | ORAL_TABLET | Freq: Every day | ORAL | Status: DC
  Administered 2019-10-25 – 2019-11-07 (×12): 1000 ug via ORAL
  Filled 2019-10-25 (×13): qty 1

## 2019-10-25 NOTE — Consult Note (Addendum)
Hospital Consult    Reason for Consult:  Occluded left subclavian stent Requesting Physician:  Dr. Darrick Meigs MRN #:  [id number]   Most information obtained from chart as pt is not cooperative with history.   History of Present Illness: This is a 69 y.o. male who was admitted to hospital yesterday with atypical CP, SOB, nausea, hematemesis, epigastric pain and hematuria.  ACS was ruled out diagnostically.  Pt has a hx of CABG x 4 2003 by Dr. Lawson Fiscal, He underwent left subclavian stenting by Dr. Gwenlyn Found in 2006, which now appears chronically occluded on CTA.  The pt is not on a statin for cholesterol management.  The pt is not on a daily aspirin.   Other AC:  none The pt is not on meds for hypertension.   The pt is not diabetic.   Tobacco hx:  Current Substance abuse hx:  Cocaine Etoh use:  Yes   Past Medical History:  Diagnosis Date  . Alcohol abuse   . Anxiety   . Breast cancer ([place]) Left breast   Per patient diagnosed in early 76's.   . Cocaine abuse ([place]) 08/19/2018  . Coronary artery disease   . High cholesterol   . Left rib fracture 08/19/2018  . Prostate cancer ([place])    per patient diagnosed in early 60's  . Stroke [facility])     Past Surgical History:  Procedure Laterality Date  . CAROTID STENT    . CORONARY ARTERY BYPASS GRAFT    . FEMUR IM NAIL Left 07/02/2014   Procedure: INTRAMEDULLARY (IM) NAIL FEMORAL;  Surgeon: Elie Goody, MD;  Location: [facility];  Service: Orthopedics;  Laterality: Left;  . I & D EXTREMITY Right 02/10/2016   Procedure: IRRIGATION AND DEBRIDEMENT EXTREMITY;  Surgeon: Dorna Leitz, MD;  Location: [employer];  Service: Orthopedics;  Laterality: Right;  . TENDON REPAIR Right 02/10/2016   Procedure: Anterior TENDON REPAIR and wound exploration.;  Surgeon: Dorna Leitz, MD;  Location: [place];  Service: Orthopedics;  Laterality: Right;    Allergies  Allergen Reactions  . Penicillins Anaphylaxis    Did it involve swelling of the face/tongue/throat, SOB,  or low BP? Yes Did it involve sudden or severe rash/hives, skin peeling, or any reaction on the inside of your mouth or nose? No Did you need to seek medical attention at a hospital or doctor's office? No When did it last happen?childhood If all above answers are "NO", may proceed with cephalosporin use.     Prior to Admission medications   Medication Sig Start Date End Date Taking? Authorizing Provider  Multiple Vitamin (MULTIVITAMIN WITH MINERALS) TABS tablet Take 1 tablet by mouth daily. 04/13/19  Yes Aline August, MD  tiotropium (SPIRIVA HANDIHALER) 18 MCG inhalation capsule Place 1 capsule (18 mcg total) into inhaler and inhale daily. Patient not taking: Reported on 10/24/2019 04/13/19 05/13/19  Aline August, MD  albuterol (VENTOLIN HFA) 108 (90 Base) MCG/ACT inhaler Inhale 2 puffs into the lungs every 6 (six) hours as needed for wheezing or shortness of breath. Use 2 puffs 3 times daily x5 days, then every 6 hours as needed. Patient not taking: Reported on 10/14/2019 04/13/19 10/14/19  Aline August, MD  atorvastatin (LIPITOR) 40 MG tablet Take 1 tablet (40 mg total) by mouth daily at 6 PM. Patient not taking: Reported on 10/14/2019 04/13/19 10/14/19  Aline August, MD  lisinopril (ZESTRIL) 40 MG tablet Take 1 tablet (40 mg total) by mouth daily. Patient not taking: Reported on 10/14/2019 04/14/19 10/14/19  Aline August, MD  metoprolol tartrate (LOPRESSOR) 25 MG tablet Take 1 tablet (25 mg total) by mouth 2 (two) times daily. Patient not taking: Reported on 10/14/2019 04/13/19 10/14/19  Aline August, MD  mometasone-formoterol (DULERA) 100-5 MCG/ACT AERO Inhale 2 puffs into the lungs 2 (two) times daily. Patient not taking: Reported on 10/14/2019 04/13/19 10/14/19  Aline August, MD    Social History   Socioeconomic History  . Marital status: Married    Spouse name: Not on file  . Number of children: Not on file  . Years of education: Not on file  . Highest education level: Not on  file  Occupational History  . Not on file  Tobacco Use  . Smoking status: Current Every Day Smoker    Packs/day: 1.00    Years: 42.00    Pack years: 42.00    Types: Cigarettes  . Smokeless tobacco: Never Used  Substance and Sexual Activity  . Alcohol use: Yes    Comment: 4-5 beers a day  . Drug use: Yes    Types: Cocaine  . Sexual activity: Not on file  Other Topics Concern  . Not on file  Social History Narrative  . Not on file   Social Determinants of Health   Financial Resource Strain:   . Difficulty of Paying Living Expenses:   Food Insecurity:   . Worried About Charity fundraiser in the Last Year:   . Arboriculturist in the Last Year:   Transportation Needs:   . Film/video editor (Medical):   Marland Kitchen Lack of Transportation (Non-Medical):   Physical Activity:   . Days of Exercise per Week:   . Minutes of Exercise per Session:   Stress:   . Feeling of Stress :   Social Connections:   . Frequency of Communication with Friends and Family:   . Frequency of Social Gatherings with Friends and Family:   . Attends Religious Services:   . Active Member of Clubs or Organizations:   . Attends Archivist Meetings:   Marland Kitchen Marital Status:   Intimate Partner Violence:   . Fear of Current or Ex-Partner:   . Emotionally Abused:   Marland Kitchen Physically Abused:   . Sexually Abused:      Family History  Problem Relation Age of Onset  . Dementia Mother     ROS: [x]  Positive   [ ]  Negative   [ ]  All sytems reviewed and are negative See HPI  Cardiac: []  chest pain/pressure []  palpitations []  SOB lying flat []  DOE  Vascular: []  pain in legs while walking []  pain in legs at rest []  pain in legs at night []  non-healing ulcers []  hx of DVT []  swelling in legs  Pulmonary: []  productive cough []  asthma/wheezing []  home O2  Neurologic: []  weakness in []  arms []  legs []  numbness in []  arms []  legs []  hx of CVA []  mini stroke [] difficulty speaking or slurred  speech []  temporary loss of vision in one eye []  dizziness  Hematologic: []  hx of cancer []  bleeding problems []  problems with blood clotting easily  Endocrine:   []  diabetes []  thyroid disease  GI []  vomiting blood []  blood in stool  GU: []  CKD/renal failure []  HD--[]  M/W/F or []  T/T/S []  burning with urination []  blood in urine  Psychiatric: []  anxiety []  depression  Musculoskeletal: []  arthritis []  joint pain  Integumentary: []  rashes []  ulcers  Constitutional: []  fever []  chills   Physical Examination  Vitals:   10/25/19 0809 10/25/19 0846  BP: (!) 165/98   Pulse: 66   Resp: 19   Temp: 98.4 F (36.9 C)   SpO2: 98% 98%   Body mass index is 18.62 kg/m.  General:  WDWN in NAD Gait: Not observed HENT: WNL, normocephalic Pulmonary: normal non-labored breathing, without Rales, rhonchi,  wheezing Cardiac: regular Abdomen:  soft, NT/ND, no masses Skin: without rashes Vascular Exam/Pulses:  Right Left  Radial 2+ (normal) Faintly palpable  Ulnar 1+ (weak) Unable to palpate   Brachial Not palpated Faintly palpable  DP Unable to palpate Unable to palpate   PT 2+ (normal) 2+ (normal)   Extremities: without ischemic changes, without Gangrene , without cellulitis; without open wounds;  Musculoskeletal: no muscle wasting or atrophy  Neurologic: A&O X 3;  No focal weakness or paresthesias are detected; speech is fluent/normal Psychiatric:  Uncooperative on exam   CBC    Component Value Date/Time   WBC 5.5 10/25/2019 0334   RBC 3.37 (L) 10/25/2019 0334   HGB 12.1 (L) 10/25/2019 0334   HCT 35.5 (L) 10/25/2019 0334   PLT 195 10/25/2019 0334   MCV 105.3 (H) 10/25/2019 0334   MCH 35.9 (H) 10/25/2019 0334   MCHC 34.1 10/25/2019 0334   RDW 12.8 10/25/2019 0334   LYMPHSABS 1.7 04/13/2019 0334   MONOABS 1.2 (H) 04/13/2019 0334   EOSABS 0.2 04/13/2019 0334   BASOSABS 0.1 04/13/2019 0334    BMET    Component Value Date/Time   NA 138 10/25/2019  0334   K 3.7 10/25/2019 0334   CL 104 10/25/2019 0334   CO2 24 10/25/2019 0334   GLUCOSE 115 (H) 10/25/2019 0334   BUN 10 10/25/2019 0334   CREATININE 0.70 10/25/2019 0334   CALCIUM 7.8 (L) 10/25/2019 0334   GFRNONAA >60 10/25/2019 0334   GFRAA >60 10/25/2019 0334    COAGS: Lab Results  Component Value Date   INR 1.0 04/05/2019   INR 0.9 04/04/2019   INR 0.96 07/02/2014     Non-Invasive Vascular Imaging:   CTA chest 10/24/2019: IMPRESSION: 1. No acute pulmonary embolism. 2. Chronically occluded left subclavian stent. 3. Status post median sternotomy. There is a age-indeterminate fracture of the sternal body which is new since 2020 but is favored to be subacute to chronic given the formation of callus. 4. There are multiple chronic thoracic spine compression fractures, unchanged from prior study.   ASSESSMENT/PLAN: This is a 69 y.o. male admitted with atypical CP, SOB, nausea, hematemesis, epigastric pain and hematuria.  ACS was ruled out diagnostically.  He was  found to have a chronically occluded left subclavian stent on CTA and vascular surgery is consulted.   -pt has faintly palpable left radial and brachial pulse.  He is uncooperative with exam and unable to get information if he is symptomatic from this.   He has hx of CABG with LIMA graft and appears he had LIMA steal with LUE claudication in 2006 when the subclavian stent was placed.   Pt was seen by cardiology back in November and the plan was to obtain nuclear stress test, however, pt's mental status did not permit this.  Pt was supposed to f/u in 8 weeks as outpatient, but I do not see any notes that this happened.  -pt continues to smoke and is non compliant with his meds and is not on asa or statin.   -pt with epigastric pain and hematemesis-unsure pt would be able to tolerate asa.  GI consult will  be obtained.   He also has chronic hematuria with hx of prostate cancer.  -Dr. Donzetta Matters to see pt this afternoon for  further plan/evaluation.   Leontine Locket, PA-C [facility] [phone]  I have independently interviewed and examined patient and agree with PA assessment and plan above. If he does not have coronary steal or insufficiency then would not recommend any intervention of what appears to be chronically occluded left subclavian stent. He is difficult to exam given his mental status at this time but he left arm appears well perfused. If there are no current coronary issues would not recommend intervention at this time that would require carotid subclavian bypass.  Orlondo Holycross C. Donzetta Matters, MD [facility] [place] Office: [phone] Pager: [phone]

## 2019-10-25 NOTE — Progress Notes (Signed)
Initial Nutrition Assessment  RD working remotely.  DOCUMENTATION CODES:   Not applicable  INTERVENTION:   -Ensure Enlive po TID, each supplement provides 350 kcal and 20 grams of protein -MVI with minerals daily  NUTRITION DIAGNOSIS:   Predicted suboptimal nutrient intake related to chronic illness(ETOH abuse) as evidenced by estimated needs.  GOAL:   Patient will meet greater than or equal to 90% of their needs  MONITOR:   PO intake, Supplement acceptance, Labs, Weight trends, Skin, I & O's  REASON FOR ASSESSMENT:   Consult, Malnutrition Screening Tool Assessment of nutrition requirement/status  ASSESSMENT:   69 yo male with CAD s/p CABG (2009), alcohol dependence with history of DTs/withdrawl seizures, history of CVAs, and persistent anxiety. Admitted to IMTS on 10/24/19 for further evaluation and monitoring of chest pain, hematemesis, hematuria and alcohol withdrawal. ACS ruled out diagnostically in ED.  Pt admitted with atypical chest pain and epigastric abdominal pain with hematemesis.   Reviewed I/O's: -550 ml x 24 hours  UOP: 500 ml x 24 hours  Attempted to speak with pt via phone, however, no answer.   Pt with history of ETOH abuse. Suspect poor oral intake and diet of poor nutritional quality PTA.   Highly suspect malnutrition, however, unable to identify at this time. Pt would greatly benefit from addition of oral nutrition supplements.   Reviewed wt hx; wt has been stable over the past year. No wt obtained from this admission. Last recorded wt 65.8 kg; this wt was used to estimated nutritional needs.   Medications reviewed and include folic acid, ativan, thiamine, and vitamin B-12.   Labs reviewed. K, Mg, and Phos WDL.   Diet Order:   Diet Order            Diet Heart Room service appropriate? Yes; Fluid consistency: Thin  Diet effective now              EDUCATION NEEDS:   No education needs have been identified at this time  Skin:  Skin  Assessment: Skin Integrity Issues: Skin Integrity Issues:: Other (Comment) Other: MASD bilateral buttocks  Last BM:  Unknown  Height:   Ht Readings from Last 1 Encounters:  10/24/19 6\' 2"  (1.88 m)    Weight:   Wt Readings from Last 1 Encounters:  10/14/19 65.8 kg    Ideal Body Weight:  86.4 kg  BMI:  Body mass index is 18.62 kg/m.  Estimated Nutritional Needs:   Kcal:  2100-2300  Protein:  115-130 grams  Fluid:  > 2.1 L    Loistine Chance, RD, LDN, [place] Registered Dietitian II Certified Diabetes Care and Education Specialist Please refer to [facility] for RD and/or RD on-call/weekend/after hours pager

## 2019-10-25 NOTE — Progress Notes (Addendum)
NAME:  [name] Pace, MRN:  [id number], DOB:  12/01/1950, LOS: 1 ADMISSION DATE:  10/24/2019   Brief History  69 yo male with ICM, CAD, alcohol dependence with history of DTs/withdrawl seizures resulting in intubation. Presented to [facility] on 10/24/19 for 2d history of atypical chest pain, shortness of breath, nausea, hematemesis, epigastric pain and hematuria. ACS effectively ruled out diagnostically.  Subjective  No overnight events No significant complaints this morning.  Significant Hospital Events   5/20 hospital admission 5/21 tolerating withdrawal well. No hematemesis  Objective   Blood pressure (!) 165/88, pulse (!) 58, temperature 98.2 F (36.8 C), temperature source Oral, resp. rate 17, height 6\' 2"  (1.88 m), SpO2 100 %.     Intake/Output Summary (Last 24 hours) at 10/25/2019 0534 Last data filed at 10/25/2019 0517 Gross per 24 hour  Intake --  Output 550 ml  Net -550 ml     Examination: GENERAL: in no acute distress CARDIAC: heart RRR.  PULMONARY: poor air movement ABDOMEN: nondistented. bs active. Diffuse tenderness to palpation most prominent in epigastric region NEURO: alert and oriented. Tremors improved. Impaired finger to nose motion. Moving all extremities. SKIN: no rash or lesions on limited exam   Consults:  none  Significant Diagnostic Tests:  5/20 CXR>hyperinflation of lungs. Right sided pleuroparenchymal scarring.  5/20 CTA chest> borderline cardiomegaly. Left subclavian stent that is occluded. No aortic dissection. No PE. Emphysema bilaterally.  Micro Data:  none  Antimicrobials:  none  Labs    CBC Latest Ref Rng & Units 10/25/2019 10/24/2019 10/14/2019  WBC 4.0 - 10.5 K/uL 5.5 6.4 6.0  Hemoglobin 13.0 - 17.0 g/dL 12.1(L) 13.9 16.1  Hematocrit 39.0 - 52.0 % 35.5(L) 40.4 46.8  Platelets 150 - 400 K/uL 195 222 167   BMP Latest Ref Rng & Units 10/25/2019 10/24/2019 10/14/2019  Glucose 70 - 99 mg/dL 115(H) 79 85  BUN 8 - 23 mg/dL 10 10 5(L)   Creatinine 0.61 - 1.24 mg/dL 0.70 0.68 0.64  Sodium 135 - 145 mmol/L 138 139 131(L)  Potassium 3.5 - 5.1 mmol/L 3.7 3.9 4.5  Chloride 98 - 111 mmol/L 104 100 95(L)  CO2 22 - 32 mmol/L 24 23 18(L)  Calcium 8.9 - 10.3 mg/dL 7.8(L) 8.2(L) 8.7(L)    Summary  69 yo chronically ill male with CAD s/p CABG, ICM, and alcohol dependence with history of DTs/seizures who was admitted to IMTS on 10/24/19 for further evaluation of chest pain, epigastric pain, hematemesis, and hematuria.   Assessment & Plan:  Active Problems:   Atypical chest pain  Atypical chest pain. Unlikely to be cardiac in origin. CTA chest negative for dissection. ACS effectively ruled out diagnostically in ED.  Still awaiting a UDS to rule out cocaine induced given similar presentations due to cocaine use in the past.  Suspect pain to be musculoskeletal 2/2 n/v over past 2d. Also likely a chronic component as he has presented similarily in the past which was considered to be from prior rib fractures. Plan Continue tele monitoring  Please obtain UDS Prn voltaren gel  Epigastric abdominal pain with hematemesis. Suspect ETOH gastritis/esophagitis.  hgb slightly down from admission however suspect this to be a hemodilutional. No documented episodes of hematemesis since admission so do not suspect that he has significant hemorrhaging. Plan --BID PPI --monitor hgb --NPO --Will need evaluation by GI at some point whether inpatient or outpatient depending on how his withdrawls progress and if he continues to have hematemesis. If no further hematemesis this  hospitalization, I think it would be reasonable to have evaluation done outpatient.  Chronic Asymptomatic hematuria. History of prostate cancer.  UA still not obtained Plan --f/u UA once collected --outpatient urological eval after discharge  Alcohol dependence with history of DTs/withdrawl seizures Plan: continue CIWA monitoring with ativan. Seizure precautions.  Folate/thiamine repletion  Recurrent falls. Likely multifactorial in setting of ETOH use, subacute combined spinal column neurodegeneration, malnutrition B12 borderline low Plan B12 repletion--will give an oral dose for now but highly likely he has a pernicious componenet as well so may need B12 injections PT/OT Nutrition consult   Occlusion of left subclavian stent. Noted on CTA from admission. Pt had noted some left arm pain on admission which we were initially concerned about from a cardiac standpoint. Perhaps this finding explains the pain he has been having.  Plan: will defer to vascular for further management.  Best practice:  CODE STATUS: full Diet: regular DVT for prophylaxis: SCDs Social considerations/Family communication: per request Dispo: pending withdrawal management   Mitzi Hansen, MD [place] PGY-1 PAGER #: [phone] 10/25/19  5:34 AM

## 2019-10-26 LAB — BASIC METABOLIC PANEL
Anion gap: 9 (ref 5–15)
BUN: 10 mg/dL (ref 8–23)
CO2: 24 mmol/L (ref 22–32)
Calcium: 8.5 mg/dL — ABNORMAL LOW (ref 8.9–10.3)
Chloride: 102 mmol/L (ref 98–111)
Creatinine, Ser: 0.7 mg/dL (ref 0.61–1.24)
GFR calc Af Amer: >60 mL/min (ref >60)
GFR calc non Af Amer: >60 mL/min (ref >60)
Glucose, Bld: 128 mg/dL — ABNORMAL HIGH (ref 70–99)
Potassium: 3.4 mmol/L — ABNORMAL LOW (ref 3.5–5.1)
Sodium: 135 mmol/L (ref 135–145)

## 2019-10-26 LAB — PHOSPHORUS: Phosphorus: 2 mg/dL — ABNORMAL LOW (ref 2.5–4.6)

## 2019-10-26 LAB — CBC
HCT: 35.5 % — ABNORMAL LOW (ref 39.0–52.0)
Hemoglobin: 12.3 g/dL — ABNORMAL LOW (ref 13.0–17.0)
MCH: 36.9 pg — ABNORMAL HIGH (ref 26.0–34.0)
MCHC: 34.6 g/dL (ref 30.0–36.0)
MCV: 106.6 fL — ABNORMAL HIGH (ref 80.0–100.0)
Platelets: 169 10*3/uL (ref 150–400)
RBC: 3.33 MIL/uL — ABNORMAL LOW (ref 4.22–5.81)
RDW: 12.7 % (ref 11.5–15.5)
WBC: 7.3 10*3/uL (ref 4.0–10.5)
nRBC: 0 % (ref 0.0–0.2)

## 2019-10-26 LAB — MAGNESIUM: Magnesium: 1.6 mg/dL — ABNORMAL LOW (ref 1.7–2.4)

## 2019-10-26 MED ORDER — MAGNESIUM SULFATE 4 GM/100ML IV SOLN
4.0000 g | Freq: Once | INTRAVENOUS | Status: AC
  Administered 2019-10-26: 4 g via INTRAVENOUS
  Filled 2019-10-26: qty 100

## 2019-10-26 MED ORDER — POTASSIUM CHLORIDE CRYS ER 20 MEQ PO TBCR
30.0000 meq | EXTENDED_RELEASE_TABLET | Freq: Two times a day (BID) | ORAL | Status: AC
  Administered 2019-10-26 (×2): 30 meq via ORAL
  Filled 2019-10-26 (×2): qty 1

## 2019-10-26 NOTE — Evaluation (Signed)
Clinical/Bedside Swallow Evaluation Patient Details  Name: [name] Pace MRN: [id number] Date of Birth: [birth year]  Today's Date: 10/26/2019 Time: SLP Start Time (ACUTE ONLY): 1048 SLP Stop Time (ACUTE ONLY): 1100 SLP Time Calculation (min) (ACUTE ONLY): 12 min  Past Medical History:  Past Medical History:  Diagnosis Date  . Alcohol abuse   . Anxiety   . Breast cancer ([place]) Left breast   Per patient diagnosed in early 51's.   . Cocaine abuse ([place]) 08/19/2018  . Coronary artery disease   . High cholesterol   . Left rib fracture 08/19/2018  . Prostate cancer ([place])    per patient diagnosed in early 80's  . Stroke [facility])    Past Surgical History:  Past Surgical History:  Procedure Laterality Date  . CAROTID STENT    . CORONARY ARTERY BYPASS GRAFT    . FEMUR IM NAIL Left 07/02/2014   Procedure: INTRAMEDULLARY (IM) NAIL FEMORAL;  Surgeon: Elie Goody, MD;  Location[facility];  Service: Orthopedics;  Laterality: Left;  . I & D EXTREMITY Right 02/10/2016   Procedure: IRRIGATION AND DEBRIDEMENT EXTREMITY;  Surgeon: Dorna Leitz, MD;  Location: [place];  Service: Orthopedics;  Laterality: Right;  . TENDON REPAIR Right 02/10/2016   Procedure: Anterior TENDON REPAIR and wound exploration.;  Surgeon: Dorna Leitz, MD;  Location: [place];  Service: Orthopedics;  Laterality: Right;   HPI:  69 yo male with CAD s/p CABG (2009), alcohol dependence with history of DTs/withdrawal, seizures, history of CVAs, and persistent anxiety. Admitted to IMTS on 10/24/19 for further evaluation and monitoring of chest pain, hematemesis, hematuria and alcohol withdrawal. Swallow eval ordered due to observed coughing when eating.  Pt well known to SLP service - tends to develop acute dysphagia related to MS changes.    Assessment / Plan / Recommendation Clinical Impression  Pt participated in limited clinical swallow assessment. He was lethargic, coughing on his secretions, drooling from right side of mouth. Pt answered  basic orientation questions appropriately; he was irritable and asked to be left alone so he could sleep. He was repositioned to maximize participation/wakefulness.  He required constant prompts to attend. Pt consumed sips of water, bites of applesauce with no overt s/s of aspiration during the swallow. Subsequently, he complained of needing to vomit and asked for the wastebasket, expectorating secretions and applesauce.  During prior admissions, pt has had acute deterioration in overall swallowing due to mental status changes; this generally has improved as MS has returned to baseline.  For today, recommend changing diet to full liquids unless GI issues warrant clear liquids only; D/W RN.  Hold tray if pt is not sufficiently alert and coughing increases.  SLP will follow briefly for safety/toleration, diet advancement in context of MS changes.   SLP Visit Diagnosis: Dysphagia, unspecified (R13.10)    Aspiration Risk  Mild aspiration risk    Diet Recommendation   full liquid diet  Medication Administration: Whole meds with puree    Other  Recommendations Oral Care Recommendations: Oral care BID   Follow up Recommendations None      Frequency and Duration min 2x/week  1 week       Prognosis Prognosis for Safe Diet Advancement: Good      Swallow Study   General HPI: 69 yo male with CAD s/p CABG (2009), alcohol dependence with history of DTs/withdrawal, seizures, history of CVAs, and persistent anxiety. Admitted to IMTS on 10/24/19 for further evaluation and monitoring of chest pain, hematemesis, hematuria and alcohol  withdrawal. Swallow eval ordered due to observed coughing when eating.  Pt well known to SLP service - tends to develop acute dysphagia related to MS changes.  Type of Study: Bedside Swallow Evaluation Previous Swallow Assessment: see HPI Diet Prior to this Study: NPO Temperature Spikes Noted: No Respiratory Status: Room air History of Recent Intubation:  No Behavior/Cognition: Confused;Lethargic/Drowsy Oral Cavity Assessment: Excessive secretions Oral Cavity - Dentition: Missing dentition Vision: Functional for self-feeding Self-Feeding Abilities: Needs assist Patient Positioning: Upright in bed Baseline Vocal Quality: Normal Volitional Cough: Strong    Oral/Motor/Sensory Function Overall Oral Motor/Sensory Function: Within functional limits   Ice Chips Ice chips: Within functional limits   Thin Liquid Thin Liquid: Within functional limits    Nectar Thick Nectar Thick Liquid: Not tested   Honey Thick Honey Thick Liquid: Not tested   Puree Puree: Impaired Presentation: Spoon Oral Phase Functional Implications: Oral holding;Oral residue   Solid     Solid: Not tested      [name] Pace 10/26/2019,11:27 AM  Estill Bamberg L. Tivis Ringer, [place] Office number [phone] Pager [phone]

## 2019-10-26 NOTE — Progress Notes (Signed)
   Subjective: Patient was tired appearing when I evaluated him. He was asking for additional ativan, reports that it helped with his chest pain. No new complaints today. He did say he vomited since being here however on speaking with the nurse no episodes of vomiting was noted. He was up eating breakfast when I spoke with her.   Objective:  Vital signs in last 24 hours: Vitals:   10/25/19 1555 10/25/19 2038 10/26/19 0045 10/26/19 0333  BP: (!) 144/81  (!) 148/73 (!) 141/79  Pulse: 73  74 75  Resp:  18 18 16   Temp: 97.6 F (36.4 C) 98.7 F (37.1 C) 99.2 F (37.3 C) 98.3 F (36.8 C)  TempSrc: Oral Axillary Axillary Oral  SpO2: 97%  97% 97%  Weight:    60.6 kg  Height:       General: Elderly male, NAD, laying in bed Cardiac: RRR, no m/r/g Pulmonary: CTABL, no wheezing, rhonchi, rales Neuro: Awake, alert, oriented x2 (thought we were in his apartment at first), CN 2-12 grossly intact, moves all extremities, no asterixis noted  Assessment/Plan:  Principal Problem:   Atypical chest pain Active Problems:   Aortic atherosclerosis (HCC)   Emphysema of lung ([place])  This is a 69 year old male with a history of CAD s/p CABG, HTN, EtOH use, Cocaine use, and housing instability who presented with chest pain, symptoms of EtOH withdrawal, hematemesis and hematuria.   EtOH withdrawal: Hx of DTs and seizures requiring ICU admission. CIWAs were elevated around 11 yesterday, did not require extra ativan overnight. Receiving librium and ativan as needed. Has gotten around 12 mg ativan over the past 24 hours. He was noted to be hypomagnasemic and hypokalemic today, will replete. Given history of severe EtOH withdrawal will continue librium 25 mg TID for now and have a slower taper.  -Continue CIWA with ativan -Continue Librium 25 mg TID -Continue folic acid, thiamine, and multivitamins -Monitor vitals and mental status  Epigastric pain, Hematemesis: No recurrence since admission. VSS. Hgb  stable. On PPI. Will need outpatient GI follow up unless recurrence during admission. Nurse reported coughing during eating, unclear if he has symptoms of dysphagia. Will further evaluate.  -NPO -SLP -Monitor Hgb -PPI BID  Hematuria: Noted during admission. U/A still pending. Hx of prostate cancer.  -Needs urology follow up outpatient  CAD s/p CABG: Chest pain: Initially presented with chest pain. No chest pain at this time. EKG unchanged. Troponins flat. Chest pain unlikely to be cardiac related. Appears to be MSK. Continuing voltaren gel.  -Continue home medications  Occlusion of left subclavian stent. Noted on CTA from admission. Vascular surgery consulted, would not recommend intervention if he does not have coronary steal or insufficiency, appears to be chronically occluded left subclavian graft.   Prior to Admission Living Arrangement: Home Anticipated Discharge Location: Home Barriers to Discharge: Withdrawal management Dispo: Anticipated discharge in approximately 2-3 day(s).   Asencion Noble, MD 10/26/2019, 7:28 AM Pager: [phone]

## 2019-10-27 DIAGNOSIS — F10239 Alcohol dependence with withdrawal, unspecified: Secondary | ICD-10-CM | POA: Diagnosis present

## 2019-10-27 LAB — BASIC METABOLIC PANEL
Anion gap: 10 (ref 5–15)
BUN: 13 mg/dL (ref 8–23)
CO2: 24 mmol/L (ref 22–32)
Calcium: 8.8 mg/dL — ABNORMAL LOW (ref 8.9–10.3)
Chloride: 103 mmol/L (ref 98–111)
Creatinine, Ser: 0.6 mg/dL — ABNORMAL LOW (ref 0.61–1.24)
GFR calc Af Amer: >60 mL/min (ref >60)
GFR calc non Af Amer: >60 mL/min (ref >60)
Glucose, Bld: 101 mg/dL — ABNORMAL HIGH (ref 70–99)
Potassium: 4.8 mmol/L (ref 3.5–5.1)
Sodium: 137 mmol/L (ref 135–145)

## 2019-10-27 LAB — MAGNESIUM: Magnesium: 1.8 mg/dL (ref 1.7–2.4)

## 2019-10-27 LAB — CBC
HCT: 40.3 % (ref 39.0–52.0)
Hemoglobin: 13.7 g/dL (ref 13.0–17.0)
MCH: 37.3 pg — ABNORMAL HIGH (ref 26.0–34.0)
MCHC: 34 g/dL (ref 30.0–36.0)
MCV: 109.8 fL — ABNORMAL HIGH (ref 80.0–100.0)
Platelets: 182 10*3/uL (ref 150–400)
RBC: 3.67 MIL/uL — ABNORMAL LOW (ref 4.22–5.81)
RDW: 12.7 % (ref 11.5–15.5)
WBC: 7.1 10*3/uL (ref 4.0–10.5)
nRBC: 0 % (ref 0.0–0.2)

## 2019-10-27 LAB — PHOSPHORUS: Phosphorus: 1.9 mg/dL — ABNORMAL LOW (ref 2.5–4.6)

## 2019-10-27 MED ORDER — PANTOPRAZOLE SODIUM 40 MG PO TBEC
40.0000 mg | DELAYED_RELEASE_TABLET | Freq: Two times a day (BID) | ORAL | Status: DC
  Administered 2019-10-27 – 2019-11-02 (×10): 40 mg via ORAL
  Filled 2019-10-27 (×12): qty 1

## 2019-10-27 MED ORDER — K PHOS MONO-SOD PHOS DI & MONO 155-852-130 MG PO TABS
500.0000 mg | ORAL_TABLET | Freq: Four times a day (QID) | ORAL | Status: AC
  Administered 2019-10-27 (×4): 500 mg via ORAL
  Filled 2019-10-27 (×4): qty 2

## 2019-10-27 MED ORDER — NICOTINE 14 MG/24HR TD PT24
14.0000 mg | MEDICATED_PATCH | Freq: Every day | TRANSDERMAL | Status: DC
  Administered 2019-10-28 – 2019-11-07 (×9): 14 mg via TRANSDERMAL
  Filled 2019-10-27 (×11): qty 1

## 2019-10-27 MED ORDER — NICOTINE 7 MG/24HR TD PT24
7.0000 mg | MEDICATED_PATCH | Freq: Every day | TRANSDERMAL | Status: DC
  Administered 2019-10-27: 7 mg via TRANSDERMAL
  Filled 2019-10-27: qty 1

## 2019-10-27 NOTE — Progress Notes (Signed)
Subjective: Patient reports doing okay today, he states that he is still having some chest discomfort. He reports that he has vomited since he has ben here, reports that it has some redness. He also reports that he gave it to the police station. He endorses hearing things that aren't here. He is oriented to person, place, and time however appears to be confused at times. He also started talking about his family members and when they passed away.   Discussed with nurse, she is not aware of any vomiting from the patient. Does report that he is confused at times. Also reported that he has been asking for a cigarette and requested increase in his nicotine patch.   Objective:  Vital signs in last 24 hours: Vitals:   10/26/19 1100 10/26/19 1718 10/26/19 2000 10/27/19 0624  BP: 133/81 (!) 145/86 (!) 159/87 (!) 165/83  Pulse: 84 82 73 79  Resp: 16 16 16 20   Temp: (!) 97.4 F (36.3 C) 97.8 F (36.6 C) 97.6 F (36.4 C) 97.9 F (36.6 C)  TempSrc: Oral Oral Oral   SpO2: 95% 99% 100% 98%  Weight:      Height:       General: Elderly male, NAD, laying in bed, tired appearing Cardiac: RRR, no m/r/g Pulmonary: CTABL, no wheezing, rhonchi, rales Abdomen: Soft, non-tender, non-distended Neuro: Awake, tired appearing, oriented x3, moves all extremities, no asterixis noted  Assessment/Plan:  Principal Problem:   Atypical chest pain Active Problems:   Aortic atherosclerosis (HCC)   Emphysema of lung ([place])  This is a 69 year old male with a history of CAD s/p CABG, HTN, EtOH use, Cocaine use, and housing instability who presented with chest pain, symptoms of EtOH withdrawal, hematemesis and hematuria.   EtOH withdrawal: Hx of DTs and seizures requiring ICU admission previously. CIWAs still elevated, 11, 9, and 17 were the most recent. He received 10 mg ativan over the past 24 hours. Given history of severe EtOH withdrawal and persistently elevated CIWAs will continue librium 25 mg TID for now and  continue to have a slow taper. Low mag and K yesterday, this was repleted and is stable today. Will continue to monitor.   -Continue CIWA with ativan -Continue Librium 25 mg TID -Continue folic acid, thiamine, and multivitamins -Monitor vitals and mental status -BMP and mag in AM -PT/OT  Epigastric pain, Hematemesis: Patient is reporting episodes of hematemesis however appears confused and reports giving it to the police station. Nurse is not aware of any emesis from the patient. VSS. Hgb stable. On PPI. Will need outpatient GI follow up unless recurrence during admission. Nurse reported coughing during eating yesterday, SLP evaluated, recommended full liquid diet.    -Monitor Hgb -Switch from IV PPI to PO PPI BID -Full liquid diet  Hematuria: Noted during admission. U/A never collected. Hx of prostate cancer.  -F/u U/A -Needs urology follow up outpatient  CAD s/p CABG: Chest pain: Initially presented with chest pain. Intermittent chest pain, tender to palpation. EKG unchanged. Troponins flat. Chest pain unlikely to be cardiac related. Appears to be MSK. Continuing voltaren gel.  -Continue home medications  Occlusion of left subclavian stent. Noted on CTA from admission. Vascular surgery consulted, would not recommend intervention if he does not have coronary steal or insufficiency, appears to be chronically occluded left subclavian graft.   Prior to Admission Living Arrangement: Home Anticipated Discharge Location: Home Barriers to Discharge: Withdrawal management Dispo: Anticipated discharge in approximately 2-3 day(s).   Lonia Skinner  Jerilynn Mages, MD 10/27/2019, 6:25 AM Pager: [phone]

## 2019-10-28 LAB — BASIC METABOLIC PANEL
Anion gap: 15 (ref 5–15)
BUN: 9 mg/dL (ref 8–23)
CO2: 19 mmol/L — ABNORMAL LOW (ref 22–32)
Calcium: 8.7 mg/dL — ABNORMAL LOW (ref 8.9–10.3)
Chloride: 101 mmol/L (ref 98–111)
Creatinine, Ser: 0.64 mg/dL (ref 0.61–1.24)
GFR calc Af Amer: >60 mL/min (ref >60)
GFR calc non Af Amer: >60 mL/min (ref >60)
Glucose, Bld: 77 mg/dL (ref 70–99)
Potassium: 3.7 mmol/L (ref 3.5–5.1)
Sodium: 135 mmol/L (ref 135–145)

## 2019-10-28 LAB — PHOSPHORUS: Phosphorus: 4.3 mg/dL (ref 2.5–4.6)

## 2019-10-28 LAB — MAGNESIUM: Magnesium: 1.5 mg/dL — ABNORMAL LOW (ref 1.7–2.4)

## 2019-10-28 MED ORDER — MAGNESIUM SULFATE 2 GM/50ML IV SOLN
2.0000 g | Freq: Once | INTRAVENOUS | Status: AC
  Administered 2019-10-28: 2 g via INTRAVENOUS
  Filled 2019-10-28: qty 50

## 2019-10-28 MED ORDER — FOLIC ACID 1 MG PO TABS
1.0000 mg | ORAL_TABLET | Freq: Every day | ORAL | Status: DC
  Administered 2019-10-30 – 2019-11-07 (×9): 1 mg via ORAL
  Filled 2019-10-28 (×10): qty 1

## 2019-10-28 MED ORDER — THIAMINE HCL 100 MG PO TABS
100.0000 mg | ORAL_TABLET | Freq: Every day | ORAL | Status: DC
  Filled 2019-10-28: qty 1

## 2019-10-28 MED ORDER — CHLORDIAZEPOXIDE HCL 25 MG PO CAPS
25.0000 mg | ORAL_CAPSULE | Freq: Two times a day (BID) | ORAL | Status: DC
  Administered 2019-10-28: 25 mg via ORAL
  Filled 2019-10-28: qty 1

## 2019-10-28 MED ORDER — K PHOS MONO-SOD PHOS DI & MONO 155-852-130 MG PO TABS
500.0000 mg | ORAL_TABLET | Freq: Four times a day (QID) | ORAL | Status: DC
  Filled 2019-10-28 (×2): qty 2

## 2019-10-28 MED ORDER — CHLORDIAZEPOXIDE HCL 25 MG PO CAPS: 25.0000 mg | ORAL_CAPSULE | Freq: Three times a day (TID) | ORAL | Status: DC

## 2019-10-28 NOTE — Progress Notes (Signed)
Attempted to give pt Jello - pt sitting straight up in bed, pt response = heavy coughing. This RN feels that it is unsafe to give pt anything PO without SLP evaluation.  Discussed concerns with Charge RN Margreta Journey) and MD on-call Aurea Graff).   Pt PO meds not given per MD Aurea Graff) verbal.  Norton Blizzard, RN

## 2019-10-28 NOTE — Progress Notes (Signed)
Occupational Therapy Evaluation Patient Details Name: [name] Pace MRN: [id number] DOB: 1950/11/16 Today's Date: 10/28/2019    History of Present Illness This is a 69 year old male with a history of CAD s/p CABG, HTN, EtOH use, Cocaine use, and housing instability who presented with chest pain, symptoms of EtOH withdrawal, hematemesis and hematuria.    Clinical Impression   Prior to hospitalization, pt reports living with his oldest brother and performing ADLs independently. Pt very poor historian, secondary to drowsiness/agitation/lethargy. Will need to follow up with family for reassurance on PLOF and available family/friend support. Pt admitted for above and is limited by decreased cognition, decreased safety awareness, decreased dynamic sitting balance, and decreased muscle strength. Today, pt requested to use the toilet. Transitioned pt to EOB with max assist, requiring mod-total assist for trunk support once aligned at EOB. Since pt unable to maintain good static sitting balance, OT decided to place pt on bed pan instead of attempting BSC transfer. Assessed bed mobility, sitting balance, endurance, cognition, ADL function including dressing and grooming. Pt required mod-total assist for bed mobility, setup for grooming, total assist for clothing management, and max verbal cues for focusing on functional tasks. OT recommends discharge to SNF. Will continue to follow acutely as able.     Follow Up Recommendations  SNF;Supervision/Assistance - 24 hour    Equipment Recommendations  Other (comment)(need to follow up with family)    Recommendations for Other Services       Precautions / Restrictions Precautions Precautions: Fall Restrictions Weight Bearing Restrictions: No      Mobility Bed Mobility Overal bed mobility: Needs Assistance Bed Mobility: Rolling;Sidelying to Sit;Sit to Supine Rolling: Mod assist(with verbal/tactile cues for hand placement of bed railing) Sidelying to  sit: Max assist   Sit to supine: Max assist   General bed mobility comments: (pt required increased trunk support while sitting EOB)  Transfers                 General transfer comment: (no assessed today secondary to medical/safety concerns)    Balance Overall balance assessment: Needs assistance Sitting-balance support: Bilateral upper extremity supported;Feet supported Sitting balance-Leahy Scale: Poor Sitting balance - Comments: (pt required mod-total assist for trunk support) Postural control: Posterior lean                                 ADL either performed or assessed with clinical judgement   ADL Overall ADL's : Needs assistance/impaired Eating/Feeding: Set up   Grooming: Wash/dry face;Set up Grooming Details (indicate cue type and reason): (multiple v/c's encouraging pt to wash face with wet cloth) Upper Body Bathing: Moderate assistance   Lower Body Bathing: Maximal assistance   Upper Body Dressing : Moderate assistance   Lower Body Dressing: Maximal assistance   Toilet Transfer: (not attempted today secondary to decreased balance at EOB)   Toileting- Clothing Manipulation and Hygiene: Total assistance;Bed level   Tub/ Shower Transfer: (not assessed secondary to safety/medical concerns)   Functional mobility during ADLs: (required mod-total assist for maintaining balance at EOB ) General ADL Comments: (pt requires simplified directions and extended time )     Vision         Perception     Praxis      Pertinent Vitals/Pain Pain Assessment: No/denies pain     Hand Dominance Right   Extremity/Trunk Assessment Upper Extremity Assessment Upper Extremity Assessment: Overall WFL for tasks assessed  Lower Extremity Assessment Lower Extremity Assessment: Defer to PT evaluation   Cervical / Trunk Assessment Cervical / Trunk Assessment: Other exceptions Cervical / Trunk Exceptions: (posterior lean)   Communication  Communication Communication: Expressive difficulties;Other (comment)(pt mumbles making it hard to understand him)   Cognition Arousal/Alertness: Lethargic;Suspect due to medications Behavior During Therapy: Agitated;Anxious Overall Cognitive Status: Difficult to assess                                 General Comments: (pt appeared drowsy most likely secondary to meds; pt mumbles)   General Comments  (pt agitated, able to answer simple questions, mumbles)    Exercises     Shoulder Instructions      Home Living Family/patient expects to be discharged to:: Private residence Living Arrangements: Other relatives(pt reports living with his oldest brother) Available Help at Discharge: Family Type of Home: House Home Access: Stairs to enter Technical brewer of Steps: 2 Entrance Stairs-Rails: (pt reports having no stair railing) Home Layout: One level     Bathroom Shower/Tub: Walk-in shower             Additional Comments: (pt difficult to understand; will need to follow up w/ family)      Prior Functioning/Environment Level of Independence: Needs assistance        Comments: (pt reports owning both a cane and RW)        OT Problem List: Decreased strength;Decreased activity tolerance;Impaired balance (sitting and/or standing);Decreased cognition      OT Treatment/Interventions: Self-care/ADL training;Therapeutic exercise;Neuromuscular education;Energy conservation;DME and/or AE instruction;Cognitive remediation/compensation;Patient/family education    OT Goals(Current goals can be found in the care plan section) Acute Rehab OT Goals Patient Stated Goal: (none stated)  OT Frequency: Min 2X/week   Barriers to D/C:            Co-evaluation   Reason for Co-Treatment: Complexity of the patient's impairments (multi-system involvement);Necessary to address cognition/behavior during functional activity;For patient/therapist safety;To address  functional/ADL transfers PT goals addressed during session: Mobility/safety with mobility        AM-PAC OT "6 Clicks" Daily Activity     Outcome Measure Help from another person eating meals?: A Little Help from another person taking care of personal grooming?: A Little Help from another person toileting, which includes using toliet, bedpan, or urinal?: Total Help from another person bathing (including washing, rinsing, drying)?: A Lot Help from another person to put on and taking off regular upper body clothing?: A Lot Help from another person to put on and taking off regular lower body clothing?: A Lot 6 Click Score: 13   End of Session Nurse Communication: Mobility status  Activity Tolerance: Patient limited by fatigue;Treatment limited secondary to agitation Patient left: in bed;with call bell/phone within reach  OT Visit Diagnosis: Unsteadiness on feet (R26.81);Muscle weakness (generalized) (M62.81);Other symptoms and signs involving cognitive function                Time: [id number] OT Time Calculation (min): 26 min Charges:  OT General Charges $OT Visit: 1 Visit OT Evaluation $OT Eval Moderate Complexity: 1 Mod  [name] Pace, OTR/L [facility] [phone]   [name] Pace 10/28/2019, 12:35 PM

## 2019-10-28 NOTE — Progress Notes (Signed)
Patient remained awake throughout the night. He has been agitated, anxious, irritable, climbing out of bed, hitting and kicking staff. Reorientation was not effective. Received a total of 14 mg Ativan, but it did not help. He even requested for a beer and was told there was none available. He has just started dozing on and off.  Will continue to monitor.

## 2019-10-28 NOTE — TOC Initial Note (Addendum)
Transition of Care [facility]) - Initial/Assessment Note    Patient Details  Name: [name] Pace MRN: [id number] Date of Birth: August 20, 1950  Transition of [facility]) CM/SW Contact:    Trula Ore, [place] Phone Number: 10/28/2019, 3:18 PM  Clinical Narrative:                  CSW spoke with patients brother [name] who is agreeable to SNF placement for patient. Patients brother [name] gave CSW permission to fax out initial referral to [place] area. Patients brother [name] wants CSW to call [name] to provide bed offers when they come in to help select SNF choice.  Bed offers pending. Facility of choice will get insurance authorization.  Pending bed offers, Pending insurance authorization.  Expected Discharge Plan: Skilled Nursing Facility Barriers to Discharge: Continued Medical Work up   Patient Goals and CMS Choice Patient states their goals for this hospitalization and ongoing recovery are:: to go to skilled nursing facility CMS Medicare.gov Compare Post Acute Care list provided to:: Patient Represenative (must comment)(brother [name]) Choice offered to / list presented to : Sibling(Brother [name])  Expected Discharge Plan and Services Expected Discharge Plan: [place]       Living arrangements for the past 2 months: Single Family Home                                      Prior Living Arrangements/Services Living arrangements for the past 2 months: Single Family Home Lives with:: Self Patient language and need for interpreter reviewed:: Yes Do you feel safe going back to the place where you live?: No   SNF  Need for Family Participation in Patient Care: Yes (Comment) Care giver support system in place?: Yes (comment)   Criminal Activity/Legal Involvement Pertinent to Current Situation/Hospitalization: No - Comment as needed  Activities of Daily Living Home Assistive Devices/Equipment: Cane (specify quad or straight) ADL Screening  (condition at time of admission) Patient's cognitive ability adequate to safely complete daily activities?: No Is the patient deaf or have difficulty hearing?: No Does the patient have difficulty seeing, even when wearing glasses/contacts?: Yes Does the patient have difficulty concentrating, remembering, or making decisions?: Yes Patient able to express need for assistance with ADLs?: Yes Does the patient have difficulty dressing or bathing?: Yes Independently performs ADLs?: Yes (appropriate for developmental age) Does the patient have difficulty walking or climbing stairs?: Yes Weakness of Legs: Both Weakness of Arms/Hands: None  Permission Sought/Granted Permission sought to share information with : Case Manager, Customer service manager, Family Supports       Permission granted to share info w AGENCY: SNF        Emotional Assessment       Orientation: : Oriented to Self Alcohol / Substance Use: Not Applicable Psych Involvement: No (comment)  Admission diagnosis:  Atypical chest pain [R07.89] Nonspecific chest pain [R07.9] Patient Active Problem List   Diagnosis Date Noted  . Alcohol dependence with withdrawal ([place]) 10/27/2019  . Aortic atherosclerosis ([place]) 10/25/2019  . Emphysema of lung ([place]) 10/25/2019  . Atypical chest pain 10/24/2019  . Elevated troponin   . Severe alcohol use disorder ([place])   . Abnormal echocardiogram 12/02/2018  . Compression fracture of T6 vertebra (HCC)   . Compression fracture of T7 vertebra (HCC)   . Compression fracture of T8 vertebra ([place])   . COPD exacerbation ([place]) 12/01/2018  . T6  vertebral fracture ([place]) 12/01/2018  . T7 vertebral fracture ([place]) 12/01/2018  . T8 vertebral fracture ([place]) 12/01/2018  . Cocaine abuse ([place]) 08/19/2018  . Polysubstance abuse ([place]) 08/19/2018  . Left rib fracture 08/19/2018  . Potomania 08/19/2018  . Alcohol abuse with intoxication ([place]) 08/19/2018  . At risk for adverse drug event 06/12/2018   . Palliative care encounter   . Encephalopathy acute   . Delirium tremens ([place]) 05/18/2018  . Malnutrition of moderate degree 05/18/2018  . Acute respiratory failure ([place])   . Shock circulatory ([place])   . Multiple rib fractures 05/14/2018  . Pneumothorax, closed, traumatic, initial encounter 05/14/2018  . Traumatic fracture of ribs with pneumothorax   . Laceration of muscle(s) and tendon(s) of anterior muscle group at lower leg level, right leg, initial encounter 02/10/2016  . Traumatic rupture of right anterior tibial tendon 02/10/2016  . Hip fracture, left ([place]) 07/02/2014  . Closed left hip fracture ([place]) 07/02/2014  . Hip fracture ([place]) 07/02/2014  . Intertrochanteric fracture of left femur ([place]) 07/02/2014  . History of stroke   . Preop examination   . Subclavian artery stenosis, left ([place]) 06/15/2014  . Chest pain 06/14/2014  . CAP (community acquired pneumonia) 06/14/2014  . Sepsis ([place]) 06/14/2014  . Alcohol abuse 06/14/2014  . CAD (coronary artery disease) 06/14/2014  . Tobacco abuse 06/14/2014   PCP:  Patient, No Pcp Per Pharmacy:   [facility] [place], [place] - [address] AT [address] [place] [address] Phone: [phone] Fax: [phone]     Social Determinants of Health (SDOH) Interventions    Readmission Risk Interventions Readmission Risk Prevention Plan 04/10/2019  Transportation Screening Complete  Medication Review ([place]) Complete  PCP or Specialist appointment within 3-5 days of discharge Complete  HRI or [place] Complete  SW Recovery Care/Counseling Consult Complete  [place] Patient Refused  Some recent data might be hidden

## 2019-10-28 NOTE — Progress Notes (Signed)
SLP Cancellation Note  Patient Details Name: [name] Pace MRN: [id number] DOB: Oct 26, 1950   Cancelled treatment:       Reason Eval/Treat Not Completed: Patient declined, no reason specified(Pt refused all p.o. intake despite encouragement and education. SLP will follow up on subsequent date.)  Meha Vidrine I. Hardin Negus, [place], [place] Office number [phone] Pager [place] 10/28/2019, 10:44 AM

## 2019-10-28 NOTE — Evaluation (Signed)
Physical Therapy Evaluation Patient Details Name: [name] Pace MRN: [id number] DOB: 1950-09-17 Today's Date: 10/28/2019   History of Present Illness  This is a 69 year old male with a history of CAD s/p CABG, HTN, EtOH use, Cocaine use, and housing instability who presented with chest pain, symptoms of EtOH withdrawal, hematemesis and hematuria.   Clinical Impression   Pt admitted with above diagnosis. Prior to hospitalization, pt reports living with his oldest brother and performing ADLs independently, using a RW to walk;  Pt very poor historian, secondary to drowsiness/agitation/lethargy. Will need to follow up with family for reassurance on PLOF and available family/friend support. Pt admitted for above and is limited by decreased cognition, decreased safety awareness, decreased dynamic sitting balance, and decreased muscle strength. Difficulty participating with therapies, needing Mod-Max assist for bed mobility, and sitting balance at EOB; tending to posterior lean and stay in posterior pelvic tilt;  Pt currently with functional limitations due to the deficits listed below (see PT Problem List). Pt will benefit from skilled PT to increase their independence and safety with mobility to allow discharge to the venue listed below.       Follow Up Recommendations SNF    Equipment Recommendations  Other (comment)(TBD next venue)    Recommendations for Other Services       Precautions / Restrictions Precautions Precautions: Fall Restrictions Weight Bearing Restrictions: No      Mobility  Bed Mobility Overal bed mobility: Needs Assistance Bed Mobility: Rolling;Sidelying to Sit;Sit to Supine Rolling: Mod assist Sidelying to sit: Max assist   Sit to supine: Max assist   General bed mobility comments: multimodal cues for technqiue; hand over hand assist to use bedrails for rolling; Lots of trunk supportcoming into sitting at EOB; required Max assist and use of bed pad to square off  hips at EOB  Transfers                 General transfer comment: Too much of a posterior lean in sitting at EOB to be able to safely attempt standing  Ambulation/Gait                Stairs            Wheelchair Mobility    Modified Rankin (Stroke Patients Only)       Balance Overall balance assessment: Needs assistance Sitting-balance support: Bilateral upper extremity supported;Feet supported Sitting balance-Leahy Scale: Poor Sitting balance - Comments: pt required mod-total assist for trunk support Postural control: Posterior lean                                   Pertinent Vitals/Pain Pain Assessment: No/denies pain    Home Living Family/patient expects to be discharged to:: Private residence Living Arrangements: Other relatives(pt reports living with his oldest brother) Available Help at Discharge: Family Type of Home: House Home Access: Stairs to enter Entrance Stairs-Rails: None Entrance Stairs-Number of Steps: 2 Home Layout: One level   Additional Comments: (pt difficult to understand; will need to follow up w/ family)    Prior Function Level of Independence: Needs assistance         Comments: Will need more information; pt indicated today that he may use a cane; noted in chart review he has used a rolling walked in the past     Hand Dominance   Dominant Hand: Right    Extremity/Trunk Assessment   Upper Extremity  Assessment Upper Extremity Assessment: Defer to OT evaluation    Lower Extremity Assessment Lower Extremity Assessment: Difficult to assess due to impaired cognition(Decr ability to follow comands; ROM hip, knee, and ankle grossly WFL; required assist to lift LEs against gravity in supine)    Cervical / Trunk Assessment Cervical / Trunk Assessment: Other exceptions Cervical / Trunk Exceptions: (posterior lean)  Communication   Communication: Expressive difficulties;Other (comment)(pt mumbles making it  hard to understand him)  Cognition Arousal/Alertness: Lethargic;Suspect due to medications Behavior During Therapy: Agitated;Anxious Overall Cognitive Status: Difficult to assess                                 General Comments: (pt appeared drowsy most likely secondary to meds; pt mumbles)      General Comments General comments (skin integrity, edema, etc.): pt agitated, able to answer simple questions, mumbles    Exercises     Assessment/Plan    PT Assessment Patient needs continued PT services  PT Problem List Decreased strength;Decreased range of motion;Decreased activity tolerance;Decreased balance;Decreased mobility;Decreased coordination;Decreased cognition;Decreased knowledge of use of DME;Decreased safety awareness;Decreased knowledge of precautions       PT Treatment Interventions DME instruction;Gait training;Functional mobility training;Therapeutic activities;Therapeutic exercise;Balance training;Patient/family education    PT Goals (Current goals can be found in the Care Plan section)  Acute Rehab PT Goals Patient Stated Goal: None stated PT Goal Formulation: Patient unable to participate in goal setting Time For Goal Achievement: 11/11/19 Potential to Achieve Goals: Fair    Frequency Min 2X/week   Barriers to discharge        Co-evaluation PT/OT/SLP Co-Evaluation/Treatment: Yes Reason for Co-Treatment: Complexity of the patient's impairments (multi-system involvement);Necessary to address cognition/behavior during functional activity;For patient/therapist safety PT goals addressed during session: Mobility/safety with mobility         AM-PAC PT "6 Clicks" Mobility  Outcome Measure Help needed turning from your back to your side while in a flat bed without using bedrails?: A Lot Help needed moving from lying on your back to sitting on the side of a flat bed without using bedrails?: A Lot Help needed moving to and from a bed to a chair  (including a wheelchair)?: A Lot Help needed standing up from a chair using your arms (e.g., wheelchair or bedside chair)?: A Lot Help needed to walk in hospital room?: Total Help needed climbing 3-5 steps with a railing? : Total 6 Click Score: 10    End of Session Equipment Utilized During Treatment: Other (comment)(bed pad) Activity Tolerance: Patient tolerated treatment well Patient left: in bed;with call bell/phone within reach;with bed alarm set;Other (comment);with restraints reapplied(on bedpan) Nurse Communication: Mobility status(and pt is on bedpan) PT Visit Diagnosis: Unsteadiness on feet (R26.81);Other abnormalities of gait and mobility (R26.89);Adult, failure to thrive (R62.7)    Time: [id number] PT Time Calculation (min) (ACUTE ONLY): 26 min   Charges:   PT Evaluation $PT Eval Moderate Complexity: 1 Mod          Roney [place], [place]  Acute Rehabilitation Services Pager [phone] Office [phone]   Colletta [place] 10/28/2019, 1:08 PM

## 2019-10-28 NOTE — Progress Notes (Signed)
NAME:  [name] Pace, MRN:  [id number], DOB:  06/12/1950, LOS: 4 ADMISSION DATE:  10/24/2019   Brief History  69 yo male with ICM, CAD, alcohol dependence with history of DTs/withdrawl seizures resulting in intubation. Presented to [facility] on 10/24/19 for 2d history of atypical chest pain, shortness of breath, nausea, hematemesis, epigastric pain and hematuria. ACS effectively ruled out diagnostically.  Subjective  Bedside RN reports pt very restless overnight.  When seen this morning, pt fairly lethargic but does awaken.  Significant Hospital Events   5/20 hospital admission 5/21 tolerating withdrawal well. No hematemesis  5/22 no significant events 5/23 no significant events 5/24 still requiring ativan Objective   Blood pressure (!) 108/94, pulse 93, temperature 98.3 F (36.8 C), temperature source Axillary, resp. rate 17, height 6\' 2"  (1.88 m), weight 58.4 kg, SpO2 98 %.     Intake/Output Summary (Last 24 hours) at 10/28/2019 0534 Last data filed at 10/27/2019 2039 Gross per 24 hour  Intake 240 ml  Output 500 ml  Net -260 ml     Examination: General: sleeping but awakens to voice Cardiac: RRR. LLE cool to touch but pedal pulses intact. Neuro: fairly lethargic but does awaken to voice. Oriented to person and place. No tremors appreciated. Moving all extremities. Skin: severely dystrophic toenails. Small wound on medial aspect of left 2nd toe. No erythema or edema of surrounding tissue. No drainage.  Consults:  none  Significant Diagnostic Tests:  5/20 CXR>hyperinflation of lungs. Right sided pleuroparenchymal scarring.  5/20 CTA chest> borderline cardiomegaly. Left subclavian stent that is occluded. No aortic dissection. No PE. Emphysema bilaterally.  Micro Data:  none  Antimicrobials:  none  Labs    CBC Latest Ref Rng & Units 10/27/2019 10/26/2019 10/25/2019  WBC 4.0 - 10.5 K/uL 7.1 7.3 5.5  Hemoglobin 13.0 - 17.0 g/dL 13.7 12.3(L) 12.1(L)  Hematocrit 39.0 - 52.0 %  40.3 35.5(L) 35.5(L)  Platelets 150 - 400 K/uL 182 169 195   BMP Latest Ref Rng & Units 10/27/2019 10/26/2019 10/25/2019  Glucose 70 - 99 mg/dL 101(H) 128(H) 115(H)  BUN 8 - 23 mg/dL 13 10 10   Creatinine 0.61 - 1.24 mg/dL 0.60(L) 0.70 0.70  Sodium 135 - 145 mmol/L 137 135 138  Potassium 3.5 - 5.1 mmol/L 4.8 3.4(L) 3.7  Chloride 98 - 111 mmol/L 103 102 104  CO2 22 - 32 mmol/L 24 24 24   Calcium 8.9 - 10.3 mg/dL 8.8(L) 8.5(L) 7.8(L)    Summary  69 yo chronically ill male with CAD s/p CABG, ICM, and alcohol dependence with history of DTs/seizures who was admitted to IMTS on 10/24/19 for further evaluation of chest pain, epigastric pain, hematemesis, and hematuria.   Assessment & Plan:  Principal Problem:   Atypical chest pain Active Problems:   Aortic atherosclerosis (HCC)   Emphysema of lung (HCC)   Alcohol dependence with withdrawal ([place])   Alcohol dependence with history of DTs/withdrawl seizures. CIWA 9-18 overnight. Received 2 doses of ativan overnight Plan: continue CIWA monitoring with ativan. Librium. Seizure precautions. Folate/thiamine/b12 repletion  Suspected PAD. LLE somewhat cool to touch this morning however pedal pulses are intact so unlikely to be an acute ischemia. Pt not seeming to have pain with touch and was not having pain with ambulation on admission. We could obtain further assessment for PAD however, given his reports of bleeding, may not be a candidate for antiplatelet agent. He has been non-compliant with management for his vascular disease and I do not suspect his compliance with  exercise therapy or smoking cessation would be much better. Will pursue further evaluation if he would agree to being compliance with medical management however could likely be followed up outpatient.  Epigastric abdominal pain with hematemesis. Suspect ETOH gastritis/esophagitis.  No hematemesis since admission. hgb stable. Plan: BID PPI. GI referral at discharge.  Chronic Asymptomatic  hematuria. History of prostate cancer.  UA ordered 5/20 however still not collected. Chart review indicates him having urine output so unclear why this has not been obtained yet Plan --please obtain UA ordered from 5/20 --outpatient urological eval after discharge  Recurrent falls. Likely multifactorial in setting of ETOH use, subacute combined spinal column neurodegeneration, malnutrition and general deconditioning Plan: Continue PT/OT  High risk malnutrition Dysphagia. Suspect to have cognitive and anatomical factors. Evaluated by SLP yesterday who noted pt to have coughing with eating. Recommended full liquid diet for now and will continue to follow. May be able to progress diet as mental status improves  Chronic Occlusion of left subclavian stent. Discussed with vascular surgery who is not recommending further management at this time.  Best practice:  CODE STATUS: full Diet: regular DVT for prophylaxis: SCDs Social considerations/Family communication: per request Dispo: pending withdrawal management   Mitzi Hansen, MD [place] PGY-1 PAGER #: [phone] 10/28/19  5:34 AM

## 2019-10-28 NOTE — NC FL2 (Signed)
[place] LEVEL OF CARE SCREENING TOOL     IDENTIFICATION  Patient Name: [name] Pace Birthdate: 1950-10-31 Sex: male Admission Date (Current Location): 10/24/2019  [facility] and [place] Number:  Herbalist and Address:  [facility] [place], [place] [address] [place], [place] [address]      Provider Number: [id number]  Attending Physician Name and Address:  Lucious Groves, DO  Relative Name and Phone Number:  [name] [phone]    Current Level of Care: Hospital Recommended Level of Care: [place] Prior Approval Number:    Date Approved/Denied: 07/03/14 PASRR Number: [id number]  Discharge Plan: SNF    Current Diagnoses: Patient Active Problem List   Diagnosis Date Noted  . Alcohol dependence with withdrawal ([place]) 10/27/2019  . Aortic atherosclerosis ([place]) 10/25/2019  . Emphysema of lung ([place]) 10/25/2019  . Atypical chest pain 10/24/2019  . Elevated troponin   . Severe alcohol use disorder ([place])   . Abnormal echocardiogram 12/02/2018  . Compression fracture of T6 vertebra (HCC)   . Compression fracture of T7 vertebra (HCC)   . Compression fracture of T8 vertebra ([place])   . COPD exacerbation ([place]) 12/01/2018  . T6 vertebral fracture ([place]) 12/01/2018  . T7 vertebral fracture ([place]) 12/01/2018  . T8 vertebral fracture ([place]) 12/01/2018  . Cocaine abuse ([place]) 08/19/2018  . Polysubstance abuse ([place]) 08/19/2018  . Left rib fracture 08/19/2018  . Potomania 08/19/2018  . Alcohol abuse with intoxication ([place]) 08/19/2018  . At risk for adverse drug event 06/12/2018  . Palliative care encounter   . Encephalopathy acute   . Delirium tremens ([place]) 05/18/2018  . Malnutrition of moderate degree 05/18/2018  . Acute respiratory failure ([place])   . Shock circulatory ([place])   . Multiple rib fractures 05/14/2018  . Pneumothorax, closed, traumatic, initial encounter 05/14/2018  . Traumatic fracture of ribs with pneumothorax   .  Laceration of muscle(s) and tendon(s) of anterior muscle group at lower leg level, right leg, initial encounter 02/10/2016  . Traumatic rupture of right anterior tibial tendon 02/10/2016  . Hip fracture, left ([place]) 07/02/2014  . Closed left hip fracture ([place]) 07/02/2014  . Hip fracture ([place]) 07/02/2014  . Intertrochanteric fracture of left femur ([place]) 07/02/2014  . History of stroke   . Preop examination   . Subclavian artery stenosis, left ([place]) 06/15/2014  . Chest pain 06/14/2014  . CAP (community acquired pneumonia) 06/14/2014  . Sepsis ([place]) 06/14/2014  . Alcohol abuse 06/14/2014  . CAD (coronary artery disease) 06/14/2014  . Tobacco abuse 06/14/2014    Orientation RESPIRATION BLADDER Height & Weight     Self  Normal Incontinent, External catheter Weight: 128 lb 12 oz (58.4 kg) Height:  6\' 2"  (188 cm)  BEHAVIORAL SYMPTOMS/MOOD NEUROLOGICAL BOWEL NUTRITION STATUS      Continent Diet(See Discharge Summary)  AMBULATORY STATUS COMMUNICATION OF NEEDS Skin   Extensive Assist Verbally Skin abrasions, Other (Comment)(Appropriate for ethnicity,dry, Abrasion MASD, Arm left moisture buttocks bilateral cleansed non-tenting)                       Personal Care Assistance Level of Assistance  Bathing, Feeding, Dressing Bathing Assistance: Maximum assistance Feeding assistance: Limited assistance(NPO) Dressing Assistance: Maximum assistance     Functional Limitations Info  Sight, Hearing, Speech Sight Info: Adequate Hearing Info: Adequate Speech Info: Adequate    SPECIAL CARE FACTORS FREQUENCY  PT (By licensed PT), OT (By licensed OT)     PT  Frequency: 5x min weekly OT Frequency: 5x min weekly            Contractures Contractures Info: Not present    Additional Factors Info  Code Status, Allergies Code Status Info: FULL Allergies Info: Penicillins           Current Medications (10/28/2019):  This is the current hospital active medication list Current  Facility-Administered Medications  Medication Dose Route Frequency Provider Last Rate Last Admin  . chlordiazePOXIDE (LIBRIUM) capsule 25 mg  25 mg Oral BID Asencion Noble, MD   25 mg at 10/28/19 1145  . diclofenac Sodium (VOLTAREN) 1 % topical gel 4 g  4 g Topical QID Asencion Noble, MD   4 g at 10/27/19 1437  . feeding supplement (ENSURE ENLIVE) (ENSURE ENLIVE) liquid 237 mL  237 mL Oral TID BM Joni Reining C, DO   237 mL at [id number]  . folic acid (FOLVITE) tablet 1 mg  1 mg Oral Daily Lonia Skinner M, MD      . LORazepam (ATIVAN) tablet 1-4 mg  1-4 mg Oral Q1H PRN Asencion Noble, MD   4 mg at 10/28/19 0135   Or  . LORazepam (ATIVAN) injection 1-4 mg  1-4 mg Intravenous Q1H PRN Sherry Ruffing, Marissa M, MD      . magnesium sulfate IVPB 2 g 50 mL  2 g Intravenous Once Sherry Ruffing, Marissa M, MD      . multivitamin with minerals tablet 1 tablet  1 tablet Oral Daily Mitzi Hansen, MD   1 tablet at 10/27/19 1039  . nicotine (NICODERM CQ - dosed in mg/24 hours) patch 14 mg  14 mg Transdermal Daily Asencion Noble, MD   14 mg at 10/28/19 1147  . ondansetron (ZOFRAN) tablet 4 mg  4 mg Oral Q6H PRN Christian, Rylee, MD       Or  . ondansetron (ZOFRAN) injection 4 mg  4 mg Intravenous Q6H PRN Christian, Rylee, MD      . pantoprazole (PROTONIX) EC tablet 40 mg  40 mg Oral BID Asencion Noble, MD   40 mg at 10/27/19 2141  . sodium chloride flush (NS) 0.9 % injection 3 mL  3 mL Intravenous Once Christian, Rylee, MD      . sodium chloride flush (NS) 0.9 % injection 3 mL  3 mL Intravenous Q12H Christian, Rylee, MD   3 mL at 10/27/19 2143  . thiamine tablet 100 mg  100 mg Oral Daily Lonia Skinner M, MD      . umeclidinium bromide (INCRUSE ELLIPTA) 62.5 MCG/INH 1 puff  1 puff Inhalation Daily Mitzi Hansen, MD   1 puff at 10/25/19 0846  . vitamin B-12 (CYANOCOBALAMIN) tablet 1,000 mcg  1,000 mcg Oral Daily Mitzi Hansen, MD   1,000 mcg at 10/27/19 1039     Discharge  Medications: Please see discharge summary for a list of discharge medications.  Relevant Imaging Results:  Relevant Lab Results:   Additional Information [id number]  Trula Ore, LCSWA

## 2019-10-29 LAB — MAGNESIUM: Magnesium: 2.1 mg/dL (ref 1.7–2.4)

## 2019-10-29 LAB — CBC
HCT: 44.4 % (ref 39.0–52.0)
Hemoglobin: 15.1 g/dL (ref 13.0–17.0)
MCH: 36.4 pg — ABNORMAL HIGH (ref 26.0–34.0)
MCHC: 34 g/dL (ref 30.0–36.0)
MCV: 107 fL — ABNORMAL HIGH (ref 80.0–100.0)
Platelets: 185 10*3/uL (ref 150–400)
RBC: 4.15 MIL/uL — ABNORMAL LOW (ref 4.22–5.81)
RDW: 13 % (ref 11.5–15.5)
WBC: 7.3 10*3/uL (ref 4.0–10.5)
nRBC: 0 % (ref 0.0–0.2)

## 2019-10-29 LAB — BASIC METABOLIC PANEL
Anion gap: 12 (ref 5–15)
BUN: 13 mg/dL (ref 8–23)
CO2: 24 mmol/L (ref 22–32)
Calcium: 8.9 mg/dL (ref 8.9–10.3)
Chloride: 103 mmol/L (ref 98–111)
Creatinine, Ser: 0.78 mg/dL (ref 0.61–1.24)
GFR calc Af Amer: >60 mL/min (ref >60)
GFR calc non Af Amer: >60 mL/min (ref >60)
Glucose, Bld: 101 mg/dL — ABNORMAL HIGH (ref 70–99)
Potassium: 4 mmol/L (ref 3.5–5.1)
Sodium: 139 mmol/L (ref 135–145)

## 2019-10-29 LAB — PHOSPHORUS: Phosphorus: 5 mg/dL — ABNORMAL HIGH (ref 2.5–4.6)

## 2019-10-29 MED ORDER — THIAMINE HCL 100 MG/ML IJ SOLN
500.0000 mg | Freq: Every day | INTRAVENOUS | Status: DC
  Administered 2019-10-29 – 2019-10-31 (×2): 500 mg via INTRAVENOUS
  Filled 2019-10-29 (×3): qty 5

## 2019-10-29 MED ORDER — THIAMINE HCL 100 MG PO TABS: 100.0000 mg | ORAL_TABLET | Freq: Every day | ORAL | Status: DC

## 2019-10-29 MED ORDER — ENOXAPARIN SODIUM 40 MG/0.4ML ~~LOC~~ SOLN
40.0000 mg | SUBCUTANEOUS | Status: DC
  Administered 2019-10-29 – 2019-11-06 (×8): 40 mg via SUBCUTANEOUS
  Filled 2019-10-29 (×9): qty 0.4

## 2019-10-29 MED ORDER — SODIUM CHLORIDE 0.9 % IV SOLN: INTRAVENOUS | Status: AC

## 2019-10-29 NOTE — Progress Notes (Signed)
NAME:  [name] Pace, MRN:  [id number], DOB:  June 03, 1951, LOS: 5 ADMISSION DATE:  10/24/2019  Subjective/Interm history  Received one dose of ativan overnight. Somewhat poor UOP--likely 2/2 minimal oral intake 2/2 mental status  Significant Hospital Events   5/20 hospital admission 5/21 tolerating withdrawal well. No hematemesis  5/22 no significant events 5/23 no significant events 5/24 still requiring ativan; therapy recommending SNF 5/25 CIWAs improved to 1-5. Librium/ativan stopped.  Objective   Blood pressure 116/88, pulse 90, temperature 98.3 F (36.8 C), temperature source Oral, resp. rate (!) 33, height 6\' 2"  (1.88 m), weight 58 kg, SpO2 98 %.     Intake/Output Summary (Last 24 hours) at 10/29/2019 [id number] Last data filed at 10/28/2019 1729 Gross per 24 hour  Intake --  Output 650 ml  Net -650 ml     Examination: General: chronically ill appearing Cardiac: RRR, no peripheral edema Pulm: difficult appreciation of lower respiratory sounds due to mouth breathing/snoring Neuro: minimally arouseable this morning but does mumble something unintelligible with sternal rub. PERRL. Withdraws from pain.   Consults:  none  Significant Diagnostic Tests:  5/20 CXR>hyperinflation of lungs. Right sided pleuroparenchymal scarring.  5/20 CTA chest> borderline cardiomegaly. Left subclavian stent that is occluded. No aortic dissection. No PE. Emphysema bilaterally.  Micro Data:  none  Antimicrobials:  none  Labs    CBC Latest Ref Rng & Units 10/27/2019 10/26/2019 10/25/2019  WBC 4.0 - 10.5 K/uL 7.1 7.3 5.5  Hemoglobin 13.0 - 17.0 g/dL 13.7 12.3(L) 12.1(L)  Hematocrit 39.0 - 52.0 % 40.3 35.5(L) 35.5(L)  Platelets 150 - 400 K/uL 182 169 195   BMP Latest Ref Rng & Units 10/29/2019 10/28/2019 10/27/2019  Glucose 70 - 99 mg/dL 101(H) 77 101(H)  BUN 8 - 23 mg/dL 13 9 13   Creatinine 0.61 - 1.24 mg/dL 0.78 0.64 0.60(L)  Sodium 135 - 145 mmol/L 139 135 137  Potassium 3.5 - 5.1 mmol/L  4.0 3.7 4.8  Chloride 98 - 111 mmol/L 103 101 103  CO2 22 - 32 mmol/L 24 19(L) 24  Calcium 8.9 - 10.3 mg/dL 8.9 8.7(L) 8.8(L)    Summary  69 yo chronically ill male with CAD s/p CABG, ICM, and alcohol dependence with history of DTs/seizures who was admitted to IMTS on 10/24/19 for further evaluation of chest pain, epigastric pain, hematemesis, and hematuria. Chest pain presumed to be MSK--ACS ruled out diagnostically. No hematuria or hematemesis since admission and hgb has been stable so will need outpatient follow up for those. His withdrawal was well managed with ativan and librium and CIWAs have been <5 so both have been discontinued. His primary issue now, is encephalopathy which is presumably 2/2 to the benzos used to treat his withdrawal. Therapy is recommending SNF for discharge so TOC is on board for that aspect. He will likely be stable for discharge pending improvement in mental status.  Assessment & Plan:  Principal Problem:   Atypical chest pain Active Problems:   Aortic atherosclerosis (HCC)   Emphysema of lung (HCC)   Alcohol dependence with withdrawal ([place])   Alcohol dependence with history of DTs/withdrawl seizures. CIWAs improving significantly--ranging between 1-5 overnight. Only received one dose of ativan overnight. Acute encephalopathy. suspect toxic 2/2 benzos for withdrawal. No appreciable focal deficits to indicate need for head imaging at this time. Suspect will see improvement with stopping benzos in the next 24-48h. Plan: d/c librium and ativan. Continue to monitor CIWAs.   Chronic Asymptomatic hematuria. History of prostate cancer.  UA  ordered 5/20 however still not collected. Chart review indicates him having urine output so unclear why this has not been obtained yet. Discussed with bedside RN this morning. Plan --please obtain UA ordered from 5/20 --outpatient urological eval after discharge  High risk malnutrition Dysphagia. Suspect to have cognitive and  anatomical factors. Mental status barring advancing diet. Bedside RN noting significant coughing spells with oral intake concerning for aspiration. SLP evaluating. Urine output tapering off so will start NS @125mL /hr and continue to monitor UOP. No significant changes in renal function.  Recurrent falls. Likely multifactorial in setting of ETOH use, subacute combined spinal column neurodegeneration, malnutrition and general deconditioning PT recommending SNF Plan: Continue PT/OT. TOC to assist in placement   Chronic/stable problems  Chronic Occlusion of left subclavian stent. Discussed with vascular surgery who is not recommending further management at this time.  Suspected PAD. Will need outpatient evaluation with ABIs  Epigastric abdominal pain with hematemesis. Suspect ETOH gastritis/esophagitis. No hematemesis since admission.  Plan: BID PPI. GI referral at discharge.  Best practice:  CODE STATUS: full Diet: NPO unless mental status improves DVT for prophylaxis: lovenox. No significant bleeding since admission Social considerations/Family communication: per request Dispo: pending improvement of mental status   Mitzi Hansen, MD [place] PGY-1 PAGER #: [phone] 10/29/19  6:24 AM

## 2019-10-29 NOTE — TOC Progression Note (Signed)
Transition of Care [facility]) - Progression Note    Patient Details  Name: YUSSUF ASTACIO MRN: [id number] Date of Birth: [birth year]  Transition of Care [facility]) CM/SW [place], [place] Phone Number: 10/29/2019, 2:22 PM  Clinical Narrative:     CSW spoke with [name] and she is agreeable to SNF placement at [facility] for patient. CSW spoke with [facility] healthcare and they confirmed that they will accept patient for SNF placement. Facility will get insurance authorization for patient.  Patient has bed at [facility] when medically ready for discharge.  Expected Discharge Plan: [place] Barriers to Discharge: Continued Medical Work up  Expected Discharge Plan and Services Expected Discharge Plan: [place] arrangements for the past 2 months: Single Family Home                                       Social Determinants of Health (SDOH) Interventions    Readmission Risk Interventions Readmission Risk Prevention Plan 04/10/2019  Transportation Screening Complete  Medication Review Press photographer) Complete  PCP or Specialist appointment within 3-5 days of discharge Complete  HRI or [place] Complete  SW Recovery Care/Counseling Consult Complete  [place] Patient Refused  Some recent data might be hidden

## 2019-10-29 NOTE — Progress Notes (Signed)
Internal Medicine Attending:   I saw and examined the patient. I reviewed the resident's note and I agree with the resident's findings and plan as documented in the resident's note.  Per nursing he was intermittently agitated overnight.  Overall he is fairly lethargic he does respond with a bit slurred speech.  Exam otherwise unremarkable.  Overall I suspect his drowsiness today is related to our benzodiazepine use and treatment of his alcohol withdrawal.  We will try to hold off this and reassess his mental status.  If not improving I would repeat a CT of his head.  Metabolic derangements overall have been reasonable we have been ongoing monitoring for hypophosphatemia in the setting of chronic malnutrition from alcohol use.  I do not see any obvious signs of infection at this time.  He has been on thiamine treatment we will go ahead and increase dose to treatment note for Wernicke's although I think this is a little less likely.

## 2019-10-29 NOTE — Discharge Summary (Addendum)
Name: [name] Pace MRN: [id number] DOB: Jan 21, 1951 69 y.o. PCP: Patient, No Pcp Per  Date of Admission: 10/24/2019  6:02 AM Date of Discharge: 11/07/2019 Attending Physician: Dr. Dareen Piano   Discharge Diagnosis: 1. ETOH withdrawal  2. Subjective hematemesis/hematuria 3. Esophageal Spasm 4. Recurrent Falls 5. Malnutrition 6. UTI  Discharge Medications: Allergies as of 11/07/2019      Reactions   Penicillins Anaphylaxis   Did it involve swelling of the face/tongue/throat, SOB, or low BP? Yes Did it involve sudden or severe rash/hives, skin peeling, or any reaction on the inside of your mouth or nose? No Did you need to seek medical attention at a hospital or doctor's office? No When did it last happen?childhood If all above answers are NO, may proceed with cephalosporin use.      Medication List    TAKE these medications   acetaminophen 325 MG tablet Commonly known as: TYLENOL Take 2 tablets (650 mg total) by mouth every 6 (six) hours as needed for up to 30 doses for mild pain, moderate pain or headache.   isosorbide mononitrate 10 MG tablet Commonly known as: ISMO Take 0.5 tablets (5 mg total) by mouth daily.   multivitamin with minerals Tabs tablet Take 1 tablet by mouth daily.   Spiriva HandiHaler 18 MCG inhalation capsule Generic drug: tiotropium Place 1 capsule (18 mcg total) into inhaler and inhale daily.       Disposition and follow-up:   Mr.[name] Pace was discharged from [facility] in Stable condition.  At the hospital follow up visit please address:  1.  Alcohol withdrawal/ alcohol dependence disorder. Pt is a [place] vetran and notes that he has been struggling with depression/anxiety and PTSD since then. He noted using about a pint of hard alcohol a day to cope. On admission, he was already having fairly significant symptoms and his history is significant for DTs/seizures with withdrawal. He was started on CIWA with ativan and  librium which were eventually able to be tapered down and discontinued.  -would recommend an inpatient treatment program if his functional status would allow in the future. -thiamine/B12 replacement  2. Chest pain. This was the presenting complaint however ACS was ruled out diagnostically. CTA chest neg for dissection. I suspect this pain was related to his chronic pain from prior rib fractures which are noted on imaging. He has had several ER visits for chest pain related to similar issues. Patient had an EGD and was found to have esophageal spasms. He was started on isosorbide 5 mg daily. -Recommend voltaren gel. I would be cautious about using NSAIDS given his history with alcohol use. - Continue isosorbide 5 mg daily, make sure patient is tolerating this medicaiton  3. Subjective hematemesis/hematuria. He had noted this on admission however did not reoccur during admission and hgb remained stable. No diagnostic or exam findings to support hepatic disease, although I did consider this given his history of alcohol use.  -follow up with GI and urology -repeat CBC to monitor hgb  4. Suspected PVD. Consider obtaining ABIs for further evaluation.  5. Chronically occluded left subclavian stent. Peripheral pulses intact. Vascular consulted and recommending continued observation at this time. Due to the stent, they would have to do a carotid-subclavian bypass, which would be fairly invasive without worsened symptoms.  6. UTI - patient had bacteruria with struvite crystals consistent with likely proteus infection. Treated with 5 days of IV ceftriaxone.   2.  Labs / imaging needed at time  of follow-up: CBC, BMP  3.  Pending labs/ test needing follow-up: none  Follow-up Appointments:  Contact information for follow-up providers    Sueanne Margarita, MD Follow up.   Specialty: Cardiology Contact information: [id number] [id number] [phone]            Contact  information for after-discharge care    [facility] .   Service: Skilled Nursing Contact information: [id number] [place] [facility] Course:   Discharge Vitals:   BP 118/80 (BP Location: Right Arm)    Pulse 67    Temp 98.2 F (36.8 C) (Oral)    Resp 19    Ht 6\' 2"  (1.88 m)    Wt 62 kg    SpO2 97%    BMI 17.54 kg/m   Pertinent Labs, Studies, and Procedures:  CBC Latest Ref Rng & Units 11/01/2019 10/29/2019 10/27/2019  WBC 4.0 - 10.5 K/uL 6.6 7.3 7.1  Hemoglobin 13.0 - 17.0 g/dL 12.7(L) 15.1 13.7  Hematocrit 39.0 - 52.0 % 37.9(L) 44.4 40.3  Platelets 150 - 400 K/uL 307 185 182   CMP Latest Ref Rng & Units 11/03/2019 11/01/2019 10/29/2019  Glucose 70 - 99 mg/dL 93 114(H) 101(H)  BUN 8 - 23 mg/dL 12 16 13   Creatinine 0.61 - 1.24 mg/dL 0.65 0.68 0.78  Sodium 135 - 145 mmol/L 143 142 139  Potassium 3.5 - 5.1 mmol/L 4.7 3.4(L) 4.0  Chloride 98 - 111 mmol/L 110 109 103  CO2 22 - 32 mmol/L 20(L) 25 24  Calcium 8.9 - 10.3 mg/dL 8.5(L) 8.7(L) 8.9  Total Protein 6.5 - 8.1 g/dL - - -  Total Bilirubin 0.3 - 1.2 mg/dL - - -  Alkaline Phos 38 - 126 U/L - - -  AST 15 - 41 U/L - - -  ALT 0 - 44 U/L - - -    Discharge Instructions: Discharge Instructions    Diet - low sodium heart healthy   Complete by: As directed    Discharge instructions   Complete by: As directed    You were hospitalized for UTI. Thank you for allowing [place] to be part of your care.   Please arrange follow up with: 1. Primary care physician  Please note these changes made to your medications:   Please START taking:  Isosorbide mononitrate 5 mg daily.    Please make sure to follow up with our doctor.  Please call our clinic if you have any questions or concerns, we may be able to help and keep you from a long and expensive emergency room wait. Our clinic and after hours phone number is [phone], the best  time to call is Monday through Friday 9 am to 4 pm but there is always someone available 24/7 if you have an emergency. If you need medication refills please notify your pharmacy one week in advance and they will send [place] a request.   Increase activity slowly   Complete by: As directed       Signed: Marianna Payment, MD 11/07/2019, 10:39 AM   Pager: [phone]

## 2019-10-29 NOTE — Progress Notes (Signed)
  Speech Language Pathology Treatment: Dysphagia  Patient Details Name: [name] Pace MRN: [id number] DOB: [birth year] Today's Date: 10/29/2019 Time: 0852-0909 SLP Time Calculation (min) (ACUTE ONLY): 17 min  Assessment / Plan / Recommendation Clinical Impression  Pt is lethargic this morning after receiving ativan overnight per RN. SLP provided multimodal stimulation and oral care with repositioning as well to try to maximize arousal. Pt sits with his eyes closed, mouth open. His voice is wet and he does not cough spontaneously or to command to try to clear. Small amounts of water were administered via swab to try to facilitate secretion management but with no attempts made to swallow despite cues. Ice chips were brought to his lips without any signs of awareness. While pt is not appropriate in this moment for POs, it is very likely for his performance to fluctuate given any medication effects and his underlying altered mentation. Will leave full liquid diet ordered, but would only administer when fully alert, upright, and accepting. Would hold if coughing is noted or if he is not actively engaged in task. SLP will f/u for further differential diagnosis of abilities as he becomes more alert.    HPI HPI: 69 yo male with CAD s/p CABG (2009), alcohol dependence with history of DTs/withdrawal, seizures, history of CVAs, and persistent anxiety. Admitted to IMTS on 10/24/19 for further evaluation and monitoring of chest pain, hematemesis, hematuria and alcohol withdrawal. Swallow eval ordered due to observed coughing when eating.  Pt well known to SLP service - tends to develop acute dysphagia related to MS changes.       SLP Plan  Continue with current plan of care       Recommendations  Diet recommendations: Thin liquid Liquids provided via: Cup;Straw Medication Administration: Whole meds with puree Supervision: Staff to assist with self feeding;Full supervision/cueing for compensatory  strategies Compensations: Minimize environmental distractions;Slow rate;Small sips/bites Postural Changes and/or Swallow Maneuvers: Seated upright 90 degrees;Upright 30-60 min after meal                Oral Care Recommendations: Oral care BID Follow up Recommendations: (tba) SLP Visit Diagnosis: Dysphagia, unspecified (R13.10) Plan: Continue with current plan of care       GO                 Osie Bond., M.A. [place] [facility] Pager [phone] Office [phone]  10/29/2019, 9:18 AM

## 2019-10-30 ENCOUNTER — Inpatient Hospital Stay (HOSPITAL_COMMUNITY): Payer: Medicare Other

## 2019-10-30 DIAGNOSIS — E43 Unspecified severe protein-calorie malnutrition: Secondary | ICD-10-CM | POA: Diagnosis present

## 2019-10-30 LAB — URINALYSIS, ROUTINE W REFLEX MICROSCOPIC
Glucose, UA: NEGATIVE mg/dL
Hgb urine dipstick: NEGATIVE
Ketones, ur: 5 mg/dL — AB
Nitrite: NEGATIVE
Protein, ur: >=300 mg/dL — AB
Specific Gravity, Urine: 1.025 (ref 1.005–1.030)
Squamous Epithelial / HPF: >50 — ABNORMAL HIGH (ref 0–5)
pH: 8 (ref 5.0–8.0)

## 2019-10-30 LAB — RAPID URINE DRUG SCREEN, HOSP PERFORMED
Amphetamines: NOT DETECTED
Barbiturates: NOT DETECTED
Benzodiazepines: POSITIVE — AB
Cocaine: NOT DETECTED
Opiates: NOT DETECTED
Tetrahydrocannabinol: NOT DETECTED

## 2019-10-30 MED ORDER — LIDOCAINE VISCOUS HCL 2 % MT SOLN
15.0000 mL | Freq: Once | OROMUCOSAL | Status: AC
  Administered 2019-10-30: 15 mL via ORAL

## 2019-10-30 MED ORDER — SODIUM CHLORIDE 0.9 % IV SOLN
1.0000 g | INTRAVENOUS | Status: AC
  Administered 2019-10-30 – 2019-11-03 (×5): 1 g via INTRAVENOUS
  Filled 2019-10-30 (×5): qty 10

## 2019-10-30 MED ORDER — ALUM & MAG HYDROXIDE-SIMETH 200-200-20 MG/5ML PO SUSP
30.0000 mL | Freq: Once | ORAL | Status: AC
  Administered 2019-10-30: 30 mL via ORAL

## 2019-10-30 MED ORDER — ALUM & MAG HYDROXIDE-SIMETH 200-200-20 MG/5ML PO SUSP
30.0000 mL | Freq: Once | ORAL | Status: AC
  Administered 2019-10-30: 30 mL via ORAL
  Filled 2019-10-30: qty 30

## 2019-10-30 MED ORDER — LIDOCAINE VISCOUS HCL 2 % MT SOLN
15.0000 mL | Freq: Once | OROMUCOSAL | Status: AC
  Administered 2019-10-30: 15 mL via ORAL
  Filled 2019-10-30: qty 15

## 2019-10-30 NOTE — Progress Notes (Signed)
Occupational Therapy Treatment Patient Details Name: [name] Pace MRN: [id number] DOB: April 01, 1951 Today's Date: 10/30/2019    History of present illness This is a 69 year old male with a history of CAD s/p CABG, HTN, EtOH use, Cocaine use, and housing instability who presented with chest pain, symptoms of EtOH withdrawal, hematemesis and hematuria.    OT comments  Upon arrival, CNA leaving from room stating that pt attempted to get out of bed. Pt c/o stomach pain. Pt irritated, frustrated, not cooperative, and restless. Pt attempted to remove electrodes multiple times. Poor readings on pulse ox and electrodes, nursing notified. Donned/doffed gloves before and after tx. Pt stated, "I would rather die than be in this situation and I don't want to do anything." Extended time taken to untangle pt's lines and comfortably/safely position him in bed. Setup for grooming, pt able to independently wash face at bed level. Pt refused to transition to EOB for self-care activities. Will attempt EOB and possible transfer next time if pt able. Recommending SNF and 24/7 supervision. Will continue to follow acutely as able.     Follow Up Recommendations  SNF;Supervision/Assistance - 24 hour    Equipment Recommendations       Recommendations for Other Services      Precautions / Restrictions Precautions Precautions: Fall Restrictions Weight Bearing Restrictions: No       Mobility Bed Mobility                  Transfers                      Balance                                           ADL either performed or assessed with clinical judgement   ADL Overall ADL's : Needs assistance/impaired     Grooming: Wash/dry hands;Wash/dry face;Set up;Bed level                                 General ADL Comments: pt refused to transition to EOB; self-care performed at bed level     Vision       Perception     Praxis      Cognition  Arousal/Alertness: Lethargic;Suspect due to medications(slightly more oriented than recent eval) Behavior During Therapy: Agitated;Anxious Overall Cognitive Status: Difficult to assess                                 General Comments: pt agitated; not motivated; frustrated by pain and staff and situation; stated multiple times that he would rather be dead than in this position        Exercises     Shoulder Instructions       General Comments mumbling today; upset and frustrated; agitated; attempting to pull off lines; extended time spent adjusting lines and oxygen levels; pt limited in participation today secondary to being uncooperative and unmotivated    Pertinent Vitals/ Pain       Pain Assessment: Faces Faces Pain Scale: Hurts little more Pain Location: stomach Pain Descriptors / Indicators: Restless;Discomfort;Cramping Pain Intervention(s): Limited activity within patient's tolerance;Monitored during session;Repositioned  Home Living  Prior Functioning/Environment              Frequency  Min 2X/week        Progress Toward Goals  OT Goals(current goals can now be found in the care plan section)  Progress towards OT goals: Progressing toward goals(limited in tx today secondary to agitation/discomfort)  Acute Rehab OT Goals Patient Stated Goal: None stated  Plan Discharge plan remains appropriate    Co-evaluation                 AM-PAC OT "6 Clicks" Daily Activity     Outcome Measure   Help from another person eating meals?: A Little Help from another person taking care of personal grooming?: A Little Help from another person toileting, which includes using toliet, bedpan, or urinal?: Total Help from another person bathing (including washing, rinsing, drying)?: A Lot Help from another person to put on and taking off regular upper body clothing?: A Lot Help from another person  to put on and taking off regular lower body clothing?: A Lot 6 Click Score: 13    End of Session    OT Visit Diagnosis: Unsteadiness on feet (R26.81);Muscle weakness (generalized) (M62.81);Other symptoms and signs involving cognitive function   Activity Tolerance Patient limited by fatigue;Patient limited by lethargy;Patient limited by pain;Treatment limited secondary to agitation   Patient Left in bed;with call bell/phone within reach;with bed alarm set   Nurse Communication Mobility status;Other (comment)(notified nurse of poor pulse ox and electrode readings)        Time: [id number] OT Time Calculation (min): 33 min  Charges: OT General Charges $OT Visit: 1 Visit OT Treatments $Self Care/Home Management : 8-22 mins $Therapeutic Activity: 8-22 mins  Michel Bickers, OTR/L [facility] [phone]   Francesca Jewett 10/30/2019, 3:09 PM

## 2019-10-30 NOTE — Progress Notes (Signed)
Patient showing low BPs this morning, likely due to cuff being on left arm, which has a subclavian occlusion.  Switched to right arm with better results.

## 2019-10-30 NOTE — Progress Notes (Signed)
Nutrition Follow-up  DOCUMENTATION CODES:   Severe malnutrition in context of social or environmental circumstances, Underweight  INTERVENTION:    Continue Ensure Enlive po TID, each supplement provides 350 kcal and 20 grams of protein  Continue MVI with minerals daily  NUTRITION DIAGNOSIS:   Severe Malnutrition related to social / environmental circumstances as evidenced by severe fat depletion, severe muscle depletion, percent weight loss(11% weight loss within 1 month).  Ongoing   GOAL:   Patient will meet greater than or equal to 90% of their needs  Progressing  MONITOR:   PO intake, Supplement acceptance, Labs, Weight trends, Skin, I & O's  REASON FOR ASSESSMENT:   Consult, Malnutrition Screening Tool Assessment of nutrition requirement/status  ASSESSMENT:   69 yo male with CAD s/p CABG (2009), alcohol dependence with history of DTs/withdrawl seizures, history of CVAs, and persistent anxiety. Admitted to IMTS on 10/24/19 for further evaluation and monitoring of chest pain, hematemesis, hematuria and alcohol withdrawal. ACS ruled out diagnostically in ED.  Patient somewhat confused during RD visit, asking where his buddy is. He says he has not been eating well, but he likes to drink. He has been drinking the Ensure Enlive supplements between meals.  Patient has been c/o abdominal pain. Abdominal ultrasound has been ordered. Patient has been on a full liquid diet since 5/22.    Labs reviewed. Phos 5 (H)  Medications reviewed and include folic acid, MVI with minerals, thiamine, vitamin B-12.  Patient has had 11% weight loss since 10/14/19. Meets criteria for severe malnutrition with significant weight loss and severe depletion of muscle and subcutaneous fat mass.  NUTRITION - FOCUSED PHYSICAL EXAM:    Most Recent Value  Orbital Region  Severe depletion  Upper Arm Region  Severe depletion  Thoracic and Lumbar Region  Severe depletion  Buccal Region  Severe  depletion  Temple Region  Severe depletion  Clavicle Bone Region  Severe depletion  Clavicle and Acromion Bone Region  Severe depletion  Scapular Bone Region  Severe depletion  Dorsal Hand  Unable to assess  Patellar Region  Severe depletion  Anterior Thigh Region  Severe depletion  Posterior Calf Region  Severe depletion  Edema (RD Assessment)  None  Hair  Reviewed  Eyes  Reviewed  Mouth  Reviewed  Skin  Reviewed  Nails  Reviewed       Diet Order:   Diet Order            Diet full liquid Room service appropriate? Yes with Assist; Fluid consistency: Thin  Diet effective now              EDUCATION NEEDS:   No education needs have been identified at this time  Skin:  Skin Assessment: Skin Integrity Issues: Skin Integrity Issues:: Other (Comment) Other: MASD bilateral buttocks  Last BM:  5/25  Height:   Ht Readings from Last 1 Encounters:  10/24/19 6\' 2"  (1.88 m)    Weight:   Wt Readings from Last 1 Encounters:  10/30/19 58.3 kg    Ideal Body Weight:  86.4 kg  BMI:  Body mass index is 16.5 kg/m.  Estimated Nutritional Needs:   Kcal:  2100-2300  Protein:  115-130 grams  Fluid:  > 2.1 L    Lucas Mallow, RD, LDN, CNSC Please refer to Amion for contact information.

## 2019-10-30 NOTE — Progress Notes (Signed)
NAME:  [name] Pace, MRN:  [id number], DOB:  Feb 03, 1951, LOS: 6 ADMISSION DATE:  10/24/2019  Subjective/Interm history  No overnight events. Mental status significantly improved yesterday. Pt noting epigastric/RUQ pain.   Significant Hospital Events   5/20 hospital admission 5/21 tolerating withdrawal well. No hematemesis  5/22 no significant events 5/23 no significant events 5/24 still requiring ativan; therapy recommending SNF. Decline in mental status 5/25 CIWAs improved to 1-5. Mental status continues to deteriorate. Librium/ativan stopped.  5/26 mental status improving today. Obtaining US for epigastric/RUQ pain. Rocephin started for triple phosphate crystals>presumed proteus  Objective   Blood pressure (!) 150/78, pulse 85, temperature 97.9 F (36.6 C), temperature source Oral, resp. rate (!) 27, height 6\' 2"  (1.88 m), weight 58.3 kg, SpO2 99 %.     Intake/Output Summary (Last 24 hours) at 10/30/2019 0540 Last data filed at 10/29/2019 1900 Gross per 24 hour  Intake 479.17 ml  Output --  Net 479.17 ml     Examination: General: chronically ill appearing Cardiac: RRR. Extremities warm Pulm: upper respiratory sounds. Breathing comfortably on room air Abd: nondistended. bs active. Pain to palpation of the RUQ/epigastric region. No rebound.  Neuro: a/o. Able to hold a conversation although difficult to understand due to speech. Moving all extremities.  Consults:  none  Significant Diagnostic Tests:  5/20 CXR>hyperinflation of lungs. Right sided pleuroparenchymal scarring.  5/20 CTA chest> borderline cardiomegaly. Left subclavian stent that is occluded. No aortic dissection. No PE. Emphysema bilaterally.  Micro Data:  UA>modate bilirubin, negative glucose, negative hgb, small # of leukocytes, negative nitirites, pH 8, +++protein, specific gravity 1.025, many bacteria  Antimicrobials:  5/26 rocephin>  Labs    CBC Latest Ref Rng & Units 10/29/2019 10/27/2019 10/26/2019   WBC 4.0 - 10.5 K/uL 7.3 7.1 7.3  Hemoglobin 13.0 - 17.0 g/dL 15.1 13.7 12.3(L)  Hematocrit 39.0 - 52.0 % 44.4 40.3 35.5(L)  Platelets 150 - 400 K/uL 185 182 169   BMP Latest Ref Rng & Units 10/29/2019 10/28/2019 10/27/2019  Glucose 70 - 99 mg/dL 101(H) 77 101(H)  BUN 8 - 23 mg/dL 13 9 13   Creatinine 0.61 - 1.24 mg/dL 0.78 0.64 0.60(L)  Sodium 135 - 145 mmol/L 139 135 137  Potassium 3.5 - 5.1 mmol/L 4.0 3.7 4.8  Chloride 98 - 111 mmol/L 103 101 103  CO2 22 - 32 mmol/L 24 19(L) 24  Calcium 8.9 - 10.3 mg/dL 8.9 8.7(L) 8.8(L)    Summary  69 yo chronically ill male with CAD s/p CABG, ICM, and alcohol dependence with history of DTs/seizures who was admitted to IMTS on 10/24/19 for further evaluation of chest pain, epigastric pain, hematemesis, and hematuria. Tolerated withdrawal well with CIWA/ativan/librium which were able to be discontinued 5/24. No RBC on UA and no episodes of hematemesis this admission. Today, with improvement in his mental status, he is still noting the epigastric/RUQ pain so we are obtaining a RUQ [place] to evaluate for biliary etiology although I still suspect it is 2/2 ETOH gastritis. He is also severely debilitated and we are awaiting SNF bed placement for ongoing rehabilitation.  Assessment & Plan:  Principal Problem:   Atypical chest pain Active Problems:   Aortic atherosclerosis (HCC)   Emphysema of lung (HCC)   Alcohol dependence with withdrawal (HCC)    Epigastric abdominal pain. Likely ETOH gastritis however does also note some pain in the RUQ region. Plan: continue PPI BID. Will also obtain a RUQ [place] to evaluate for biliary etiology.   Acute  toxic encephalopathy--improving. Fairly significantly improved from yesterday. Able to hold a conversation today. Suspect encephalopathy is attributable to the ativan/librium that he was requiring for withdrawal. No indication for head imaging at this time and suspect that symptoms should continue to improve. Continue to  monitor.  Bacturia UA negative for blood however did reveal triple phosphate crystals indicitive of struvite stone. Given the presence of bacteria and slightly basic pH, may be proteus infection. Plan: Rocephin (Tx day #1/5)  High risk malnutrition Dysphagia. Suspect to be correlated to mental status and should see improvement as his MS continues to improve. Continue supplement replacement  Physical deconditioning>Recurrent falls. Likely multifactorial in setting of ETOH use, subacute combined spinal column neurodegeneration, malnutrition and general deconditioning PT recommending SNF  Plan: Continue PT/OT. TOC to assist in placement   Chronic/stable problems  Chronic Occlusion of left subclavian stent. Discussed with vascular surgery who is not recommending further management at this time.  Suspected PAD. Will need outpatient evaluation with ABIs  Alcohol dependence with history of DTs/withdrawl seizures. Withdrawal resolved. Denies alcohol cravings  Best practice:  CODE STATUS: full Diet: ADAT per SLP recs DVT for prophylaxis: lovenox.  Social considerations/Family communication: per request Dispo: suspect discharge in [place] pending bed placement   Mitzi Hansen, MD [place] PGY-1 PAGER #: [phone] 10/30/19  5:40 AM

## 2019-10-30 NOTE — Care Management Important Message (Signed)
Important Message  Patient Details  Name: [name] Pace MRN: [id number] Date of Birth: 09-20-1950   Medicare Important Message Given:  Yes     [name] 10/30/2019, 10:59 AM

## 2019-10-30 NOTE — Progress Notes (Signed)
SLP Cancellation Note  Patient Details Name: [name] Pace MRN: [id number] DOB: Jun 27, 1950   Cancelled treatment:       Reason Eval/Treat Not Completed: Medical issues which prohibited therapy(Pt is NPO for abdominal US. SLP will follow up on subsequent date.)  [name] I. Hardin Negus, [place], [place] Office number [phone] Pager [place] 10/30/2019, 4:37 PM

## 2019-10-31 DIAGNOSIS — Z681 Body mass index (BMI) 19 or less, adult: Secondary | ICD-10-CM

## 2019-10-31 NOTE — Progress Notes (Signed)
  Speech Language Pathology Treatment: Dysphagia  Patient Details Name: [name] Pace MRN: [id number] DOB: Mar 03, 1951 Today's Date: 10/31/2019 Time: [id number] SLP Time Calculation (min) (ACUTE ONLY): 11 min  Assessment / Plan / Recommendation Clinical Impression  Pt was seen for dysphagia treatment. He was alert throughout the session but confused. Pt demonstrated a baseline cough and coughing was also inconsistently demonstrated with puree solids and, to a lesser extent, with thin liquids. Mastication time was prolonged with dysphagia 2 solids but no significant oral residue was noted. A modified barium swallow study is recommended to further assess swallow function and it will be scheduled for completion on 11/01/19.    HPI HPI: 69 yo male with CAD s/p CABG (2009), alcohol dependence with history of DTs/withdrawal, seizures, history of CVAs, and persistent anxiety. Admitted to IMTS on 10/24/19 for further evaluation and monitoring of chest pain, hematemesis, hematuria and alcohol withdrawal. Swallow eval ordered due to observed coughing when eating.  Pt well known to SLP service - tends to develop acute dysphagia related to MS changes.       SLP Plan  Continue with current plan of care       Recommendations  Diet recommendations: Thin liquid Liquids provided via: Cup;Straw Medication Administration: Crushed with puree Supervision: Staff to assist with self feeding;Full supervision/cueing for compensatory strategies Compensations: Minimize environmental distractions;Slow rate;Small sips/bites Postural Changes and/or Swallow Maneuvers: Seated upright 90 degrees;Upright 30-60 min after meal                Oral Care Recommendations: Oral care BID Follow up Recommendations: (tba) SLP Visit Diagnosis: Dysphagia, unspecified (R13.10) Plan: Continue with current plan of care       [name] Pace I. [name] Pace, [place], [place] Office number [phone] Pager  [name] Pace 10/31/2019, 4:54 PM

## 2019-10-31 NOTE — TOC Progression Note (Addendum)
Transition of Care [facility]) - Progression Note    Patient Details  Name: [name] Pace MRN: [id number] Date of Birth: 12-28-1950  Transition of Care [facility]) CM/SW [place], Portal Phone Number: 10/31/2019, 10:00 AM  Clinical Narrative:     Update 5/27- CSW spoke with [name] to let her know that there is no SNF bed offers for patient. CSW and patients daughter discussed possible discharge plan for patient. Patients daughter is not interested in home health for patient. Patients daughter is going to look into possible treatment services for substance abuse that CSW gave her.  Update 5/27 3:05pm- [facility] called CSW to let her know that they are not in network with this patients insurance. Patient does not have any bed offers at this time. CSW will continue to follow.  CSW spoke with [place] from [facility] SNF. She started insurance authorization for patient.  Insurance authorization pending. Patient has SNF bed at [facility] care. Order has been placed for another Covid.  CSW will continue to follow.  Expected Discharge Plan: [place] Barriers to Discharge: Continued Medical Work up  Expected Discharge Plan and Services Expected Discharge Plan: [place] arrangements for the past 2 months: Single Family Home                                       Social Determinants of Health (SDOH) Interventions    Readmission Risk Interventions Readmission Risk Prevention Plan 04/10/2019  Transportation Screening Complete  Medication Review Press photographer) Complete  PCP or Specialist appointment within 3-5 days of discharge Complete  HRI or [place] Complete  SW Recovery Care/Counseling Consult Complete  [place] Patient Refused  Some recent data might be hidden

## 2019-10-31 NOTE — Plan of Care (Signed)
  Problem: Coping: Goal: Level of anxiety will decrease Outcome: Progressing   Problem: Pain Managment: Goal: General experience of comfort will improve Outcome: Progressing   Problem: Safety: Goal: Ability to remain free from injury will improve Outcome: Progressing   Problem: Health Behavior/Discharge Planning: Goal: Ability to manage health-related needs will improve Outcome: Not Progressing  Pt continues to be oriented to self only requiring moderate to maximum assistance with tasks.

## 2019-10-31 NOTE — Progress Notes (Signed)
Internal Medicine Attending:   I saw and examined the patient. I reviewed the resident's note and I agree with the resident's findings and plan as documented in the resident's note.  Mental status does appear to be improving not quite to baseline.  Did pull out his IV.  Overall I suspect his acute encphalopathy to be multifactorial, we are treating his alcohol withdrawal, thiamine deficiency and potential UTI.  Anticipate he will be able to be discharged to a skilled nursing facility in the next 1 to 2 days.

## 2019-10-31 NOTE — Progress Notes (Signed)
Physical Therapy Treatment Patient Details Name: [name] Pace MRN: [id number] DOB: [birth year] Today's Date: 10/31/2019    History of Present Illness This is a 69 year old male with a history of CAD s/p CABG, HTN, EtOH use, Cocaine use, and housing instability who presented with chest pain, symptoms of EtOH withdrawal, hematemesis and hematuria.     PT Comments    Pt supine in bed on entry, working with SLP on swallowing. Pt agreeable to coming to sit EoB with therapy and is happy to have his mittens removed. Pt is limited in safe mobility by internal distraction and decreased command follow in presence of decreased strength and balance. Pt requires modA for coming to sit EoB, modAx2 to come to standing but is unable to achieve due to preoccupation with tag on therapist shirt, ultimately unable to come to fully upright or take steps towards HoB. Pt requires maxA to return to supine. PT will continue to follow acutely. D/c plans remain appropriate.    Follow Up Recommendations  SNF     Equipment Recommendations  Other (comment)(TBD next venue)       Precautions / Restrictions Precautions Precautions: Fall Restrictions Weight Bearing Restrictions: No    Mobility  Bed Mobility Overal bed mobility: Needs Assistance Bed Mobility: Sit to Supine;Supine to Sit     Supine to sit: Mod assist;+2 for physical assistance Sit to supine: Max assist   General bed mobility comments: modA x2, hand over hand techinque for reaching to bed rail, assist for LE off bed and pad scoot of hips to EoB  Transfers Overall transfer level: Needs assistance Equipment used: 2 person hand held assist Transfers: Sit to/from Stand Sit to Stand: Mod assist;+2 physical assistance         General transfer comment: modA x2 for power up and steadying unable to come to fully upright due to distraction from task at hand, encouraged lateral stepping to HoB however pt distracted and ultimately sits down         Balance Overall balance assessment: Needs assistance   Sitting balance-Leahy Scale: Fair Sitting balance - Comments: able to achieve static sitting for 30 sec, requires outside assit for dynamic activity  Postural control: Posterior lean                                  Cognition Arousal/Alertness: Awake/alert Behavior During Therapy: Anxious Overall Cognitive Status: Impaired/Different from baseline Area of Impairment: Awareness;Following commands;Problem solving;Safety/judgement                       Following Commands: Follows multi-step commands inconsistently;Follows one step commands inconsistently Safety/Judgement: Decreased awareness of safety Awareness: Emergent Problem Solving: Slow processing;Decreased initiation;Difficulty sequencing;Requires verbal cues;Requires tactile cues General Comments: pt very tangential, mumbling about multiple things, his family, his time in [place], his bed, frustrated with mittens, happy to have them removed, however in standing pt focused on and picking at tag on hem of therapist shirt and not on task at hand of standing and stepping         General Comments General comments (skin integrity, edema, etc.): VSS on RA      Pertinent Vitals/Pain Pain Assessment: 0-10 Pain Score: 7  Pain Location: all over           PT Goals (current goals can now be found in the care plan section) Acute Rehab PT Goals Patient Stated Goal: None  stated PT Goal Formulation: Patient unable to participate in goal setting Time For Goal Achievement: 11/11/19 Potential to Achieve Goals: Fair Progress towards PT goals: Progressing toward goals    Frequency    Min 2X/week      PT Plan Current plan remains appropriate    Co-evaluation PT/OT/SLP Co-Evaluation/Treatment: Yes            AM-PAC PT "6 Clicks" Mobility   Outcome Measure  Help needed turning from your back to your side while in a flat bed without using  bedrails?: A Lot Help needed moving from lying on your back to sitting on the side of a flat bed without using bedrails?: Total Help needed moving to and from a bed to a chair (including a wheelchair)?: Total Help needed standing up from a chair using your arms (e.g., wheelchair or bedside chair)?: Total Help needed to walk in hospital room?: Total Help needed climbing 3-5 steps with a railing? : Total 6 Click Score: 7    End of Session   Activity Tolerance: Patient tolerated treatment well Patient left: in bed;with call bell/phone within reach;with bed alarm set;Other (comment)(mittens reapplied) Nurse Communication: Mobility status PT Visit Diagnosis: Unsteadiness on feet (R26.81);Other abnormalities of gait and mobility (R26.89);Adult, failure to thrive (R62.7)     Time: 1119-1140 PT Time Calculation (min) (ACUTE ONLY): 21 min  Charges:  $Therapeutic Activity: 8-22 mins                     Sufyan Meidinger B. Migdalia Dk PT, DPT Acute Rehabilitation Services Pager [phone] Office [phone]    [place] 10/31/2019, 4:01 PM

## 2019-10-31 NOTE — Progress Notes (Signed)
NAME:  [name] Pace, MRN:  [id number], DOB:  02/04/1951, LOS: 7 ADMISSION DATE:  10/24/2019  Subjective/Interm history  No overnight events. Patient in mittens as he pulled out his IV. IV being replaced at bedside. Mental status improved from a few days prior. Pt asking about getting his teeth replaced. No acute concerns at this time.  Significant Hospital Events   5/20 hospital admission 5/21 tolerating withdrawal well. No hematemesis  5/22 no significant events 5/23 no significant events 5/24 still requiring ativan; therapy recommending SNF. Decline in mental status 5/25 CIWAs improved to 1-5. Mental status continues to deteriorate. Librium/ativan stopped.  5/26 mental status improving today. No significant findings on RUQ [place]. Rocephin started for triple phosphate crystals>presumed proteus 5/27 continued improvement in mental status. Stable for discharge pending bed placement  Objective   Blood pressure (!) 147/87, pulse 81, temperature 98.6 F (37 C), temperature source Oral, resp. rate (!) 28, height 6\' 2"  (1.88 m), weight 59.1 kg, SpO2 98 %.     Intake/Output Summary (Last 24 hours) at 10/31/2019 0541 Last data filed at 10/30/2019 2200 Gross per 24 hour  Intake 721.26 ml  Output 975 ml  Net -253.74 ml     Examination: General: chronically ill appearing Cardiac: RRR Neuro: a/o. Still difficult to understand speech.   Consults:  none  Significant Diagnostic Tests:  5/20 CXR>hyperinflation of lungs. Right sided pleuroparenchymal scarring.  5/20 CTA chest> borderline cardiomegaly. Left subclavian stent that is occluded. No aortic dissection. No PE. Emphysema bilaterally.  5/26 RUQ US>hepatosteatosis. No CBD dilation.  Micro Data:  UA>modate bilirubin, negative glucose, negative hgb, small # of leukocytes, negative nitirites, pH 8, +++protein, specific gravity 1.025, many bacteria  Antimicrobials:  5/26 rocephin>  Summary  69 yo chronically ill male with CAD s/p  CABG, ICM, and alcohol dependence with history of DTs/seizures who was admitted to IMTS on 10/24/19 for further evaluation of chest pain, epigastric pain, hematemesis, and hematuria. Tolerated withdrawal well with CIWA/ativan/librium which were able to be discontinued 5/24. No RBC on UA and no episodes of hematemesis this admission. We began empiric treatment for UTI with rocephin on 5/26.  Mental status improved 5/26. He is also severely debilitated and we are awaiting SNF bed placement for ongoing rehabilitation.  Assessment & Plan:  Principal Problem:   Atypical chest pain Active Problems:   Aortic atherosclerosis (HCC)   Emphysema of lung (HCC)   Alcohol dependence with withdrawal (HCC)   Protein-calorie malnutrition, severe    Epigastric abdominal pain. Likely ETOH gastritis vs MSK. No acute findings on RUQ [place]. Plan: continue PPI BID. voltaren gel to affected area.  Acute toxic encephalopathy. Continuing to improve. Continue holding central acting medications.  Bacturia UA negative for blood however did reveal triple phosphate crystals indicitive of struvite stone. Given the presence of bacteria and slightly basic pH, may be proteus infection. Plan: Rocephin (Tx day #2/5)  High risk malnutrition Dysphagia. Appreciate repeat SLP eval to see if we can advance diet. Continue supplement replacement  Physical deconditioning>Recurrent falls. Likely multifactorial in setting of ETOH use, subacute combined spinal column neurodegeneration, malnutrition and general deconditioning. Will require ongoing rehabilitation. Stable to discharge to SNF pending bed placement.   Chronic/stable problems  Chronic Occlusion of left subclavian stent. Discussed with vascular surgery who is not recommending further management at this time.  Suspected PAD. Will need outpatient evaluation with ABIs  Alcohol dependence with history of DTs/withdrawl seizures. Withdrawal resolved. Denies alcohol  cravings  Best practice:  CODE  STATUS: full Diet: ADAT per SLP recs DVT for prophylaxis: lovenox.  Social considerations/Family communication: per request Dispo: medically stable for discharge pending bed placement.   Mitzi Hansen, MD INTERNAL MEDICINE RESIDENT PGY-1 PAGER #: [phone] 10/31/19  5:41 AM

## 2019-11-01 ENCOUNTER — Inpatient Hospital Stay (HOSPITAL_COMMUNITY): Payer: Medicare Other

## 2019-11-01 LAB — CBC WITH DIFFERENTIAL/PLATELET
Abs Immature Granulocytes: 0.02 10*3/uL (ref 0.00–0.07)
Basophils Absolute: 0.1 10*3/uL (ref 0.0–0.1)
Basophils Relative: 1 %
Eosinophils Absolute: 0.1 10*3/uL (ref 0.0–0.5)
Eosinophils Relative: 2 %
HCT: 37.9 % — ABNORMAL LOW (ref 39.0–52.0)
Hemoglobin: 12.7 g/dL — ABNORMAL LOW (ref 13.0–17.0)
Immature Granulocytes: 0 %
Lymphocytes Relative: 18 %
Lymphs Abs: 1.2 10*3/uL (ref 0.7–4.0)
MCH: 36.2 pg — ABNORMAL HIGH (ref 26.0–34.0)
MCHC: 33.5 g/dL (ref 30.0–36.0)
MCV: 108 fL — ABNORMAL HIGH (ref 80.0–100.0)
Monocytes Absolute: 1 10*3/uL (ref 0.1–1.0)
Monocytes Relative: 15 %
Neutro Abs: 4.3 10*3/uL (ref 1.7–7.7)
Neutrophils Relative %: 64 %
Platelets: 307 10*3/uL (ref 150–400)
RBC: 3.51 MIL/uL — ABNORMAL LOW (ref 4.22–5.81)
RDW: 12.5 % (ref 11.5–15.5)
WBC: 6.6 10*3/uL (ref 4.0–10.5)
nRBC: 0 % (ref 0.0–0.2)

## 2019-11-01 LAB — RENAL FUNCTION PANEL
Albumin: 2.4 g/dL — ABNORMAL LOW (ref 3.5–5.0)
Anion gap: 8 (ref 5–15)
BUN: 16 mg/dL (ref 8–23)
CO2: 25 mmol/L (ref 22–32)
Calcium: 8.7 mg/dL — ABNORMAL LOW (ref 8.9–10.3)
Chloride: 109 mmol/L (ref 98–111)
Creatinine, Ser: 0.68 mg/dL (ref 0.61–1.24)
GFR calc Af Amer: >60 mL/min (ref >60)
GFR calc non Af Amer: >60 mL/min (ref >60)
Glucose, Bld: 114 mg/dL — ABNORMAL HIGH (ref 70–99)
Phosphorus: 3.3 mg/dL (ref 2.5–4.6)
Potassium: 3.4 mmol/L — ABNORMAL LOW (ref 3.5–5.1)
Sodium: 142 mmol/L (ref 135–145)

## 2019-11-01 MED ORDER — SODIUM CHLORIDE 0.9% FLUSH
10.0000 mL | Freq: Two times a day (BID) | INTRAVENOUS | Status: DC
  Administered 2019-11-01 – 2019-11-06 (×8): 10 mL

## 2019-11-01 MED ORDER — SODIUM CHLORIDE 0.9% FLUSH: 10.0000 mL | INTRAVENOUS | Status: DC | PRN

## 2019-11-01 NOTE — Progress Notes (Signed)
Modified Barium Swallow Progress Note  Patient Details  Name: [name] Pace MRN: [id number] Date of Birth: 08/15/1950  Today's Date: 11/01/2019  Modified Barium Swallow completed.  Full report located under Chart Review in the Imaging Section.  Brief recommendations include the following:  Clinical Impression  Pt presents with oropharyngeal dyspahgia characterized by prolonged mastication, reduced bolus cohesion, and reduced anterior laryngeal movement. He demonstrated premature spillage of liquids to the valleculae, occasional incomplete epiglottic inversion, and inconsistent penetratio (PAS 3) of nectar thick liquids with ultimate aspiration (PAS 7) secondary to incomplete epiglottic inversion. Esophageal screening revealed slow movement of the 16mm barium tablet through the thoracic esophagus with retention of barium, backflow to the cervical esophagus and pharynx, and aspiration (PAS 7) of backflowed material. Pt exhibited more difficulty with backflow when there was a solid bolus and subsequent aspiration retained in the esophagus. It is therefore recommended that the pt's diet be modified to clear liquids and that an esophageal assessment and/or GI consult be completed. RN an MD have been advised of results and these recommendations.    Swallow Evaluation Recommendations   Recommended Consults: Consider GI evaluation;Consider esophageal assessment   SLP Diet Recommendations: Thin liquid   Liquid Administration via: Cup;Straw   Medication Administration: Whole meds with liquid (crush larger pills and provide in puree)   Supervision: Patient able to self feed   Compensations: Minimize environmental distractions;Slow rate;Small sips/bites   Postural Changes: Remain semi-upright after after feeds/meals (Comment);Seated upright at 90 degrees   Oral Care Recommendations: Oral care BID      Harlynn Kimbell I. Hardin Negus, [place], [place] Office number  [phone] Pager [place] 11/01/2019,2:59 PM

## 2019-11-01 NOTE — TOC Progression Note (Signed)
Transition of Care [facility]) - Progression Note    Patient Details  Name: [name] Pace MRN: [id number] Date of Birth: [birth year]  Transition of Care [facility]) CM/SW [place], [place] Phone Number: 11/01/2019, 4:05 PM  Clinical Narrative:     CSW received call from [employer]. Clinicals are under review for possible SNF placement to a in network SNF facility. CSW is waiting call back from [place] to see if they can accept patient. If both facility's cannot offer then clinicals will be sent for further review to medical director for possible placement at [facility].  CSW waiting call back from [place] with [employer].  Expected Discharge Plan: [place] Barriers to Discharge: Continued Medical Work up  Expected Discharge Plan and Services Expected Discharge Plan: [place] arrangements for the past 2 months: Single Family Home                                       Social Determinants of Health (SDOH) Interventions    Readmission Risk Interventions Readmission Risk Prevention Plan 04/10/2019  Transportation Screening Complete  Medication Review Press photographer) Complete  PCP or Specialist appointment within 3-5 days of discharge Complete  HRI or [place] Complete  SW Recovery Care/Counseling Consult Complete  [place] Patient Refused  Some recent data might be hidden

## 2019-11-01 NOTE — Plan of Care (Signed)
  Problem: Education: Goal: Knowledge of General Education information will improve Description Including pain rating scale, medication(s)/side effects and non-pharmacologic comfort measures Outcome: Progressing   Problem: Nutrition: Goal: Adequate nutrition will be maintained Outcome: Progressing   Problem: Elimination: Goal: Will not experience complications related to urinary retention Outcome: Progressing   Problem: Pain Managment: Goal: General experience of comfort will improve Outcome: Progressing   Problem: Safety: Goal: Ability to remain free from injury will improve Outcome: Progressing   

## 2019-11-01 NOTE — Consult Note (Signed)
Referring Provider:  [facility]TS Primary Care Physician:  Patient, No Pcp Per Primary Gastroenterologist:  Dr. Penelope Coop   Reason for Consultation: Abnormal modified barium swallow, history of hematemesis, epigastric pain  HPI: [name] Pace is a 69 y.o. male with past medical history of coronary artery disease status post CABG, history of alcohol and polysubstance abuse, history of DTs presented to the hospital on Oct 24, 2019 with chest pain, hematemesis and epigastric pain. He was ruled out for acute coronary syndrome. He was subsequently being treated for alcohol withdrawal. Also has encephalopathy. Underwent modified barium swallow test today which showed retention of barium into thoracic esophagus with backflow to cervical esophagus and subsequently had aspiration. GI is consulted for further evaluation.  Patient seen and examined at bedside. Not able to obtain any history from patient. Chart reviewed.    Past Medical History:  Diagnosis Date  . Alcohol abuse   . Anxiety   . Breast cancer ([place]) Left breast   Per patient diagnosed in early 4's.   . Cocaine abuse ([place]) 08/19/2018  . Coronary artery disease   . High cholesterol   . Left rib fracture 08/19/2018  . Prostate cancer ([place])    per patient diagnosed in early 28's  . Stroke [facility])     Past Surgical History:  Procedure Laterality Date  . CAROTID STENT    . CORONARY ARTERY BYPASS GRAFT    . FEMUR IM NAIL Left 07/02/2014   Procedure: INTRAMEDULLARY (IM) NAIL FEMORAL;  Surgeon: Elie Goody, MD;  [facility];  Service: Orthopedics;  Laterality: Left;  . I & D EXTREMITY Right 02/10/2016   Procedure: IRRIGATION AND DEBRIDEMENT EXTREMITY;  Surgeon: Dorna Leitz, MD;  Location: [place];  Service: Orthopedics;  Laterality: Right;  . TENDON REPAIR Right 02/10/2016   Procedure: Anterior TENDON REPAIR and wound exploration.;  Surgeon: Dorna Leitz, MD;  Location: [place];  Service: Orthopedics;  Laterality: Right;    Prior to Admission  medications   Medication Sig Start Date End Date Taking? Authorizing Provider  Multiple Vitamin (MULTIVITAMIN WITH MINERALS) TABS tablet Take 1 tablet by mouth daily. 04/13/19  Yes Aline August, MD  tiotropium (SPIRIVA HANDIHALER) 18 MCG inhalation capsule Place 1 capsule (18 mcg total) into inhaler and inhale daily. Patient not taking: Reported on 10/24/2019 04/13/19 05/13/19  Aline August, MD  albuterol (VENTOLIN HFA) 108 (90 Base) MCG/ACT inhaler Inhale 2 puffs into the lungs every 6 (six) hours as needed for wheezing or shortness of breath. Use 2 puffs 3 times daily x5 days, then every 6 hours as needed. Patient not taking: Reported on 10/14/2019 04/13/19 10/14/19  Aline August, MD  atorvastatin (LIPITOR) 40 MG tablet Take 1 tablet (40 mg total) by mouth daily at 6 PM. Patient not taking: Reported on 10/14/2019 04/13/19 10/14/19  Aline August, MD  lisinopril (ZESTRIL) 40 MG tablet Take 1 tablet (40 mg total) by mouth daily. Patient not taking: Reported on 10/14/2019 04/14/19 10/14/19  Aline August, MD  metoprolol tartrate (LOPRESSOR) 25 MG tablet Take 1 tablet (25 mg total) by mouth 2 (two) times daily. Patient not taking: Reported on 10/14/2019 04/13/19 10/14/19  Aline August, MD  mometasone-formoterol (DULERA) 100-5 MCG/ACT AERO Inhale 2 puffs into the lungs 2 (two) times daily. Patient not taking: Reported on 10/14/2019 04/13/19 10/14/19  Aline August, MD    Scheduled Meds: . diclofenac Sodium  4 g Topical QID  . enoxaparin (LOVENOX) injection  40 mg Subcutaneous Q24H  . feeding supplement (ENSURE ENLIVE)  237 mL Oral TID BM  . folic acid  1 mg Oral Daily  . multivitamin with minerals  1 tablet Oral Daily  . nicotine  14 mg Transdermal Daily  . pantoprazole  40 mg Oral BID  . sodium chloride flush  10-40 mL Intracatheter Q12H  . sodium chloride flush  3 mL Intravenous Once  . sodium chloride flush  3 mL Intravenous Q12H  . umeclidinium bromide  1 puff Inhalation Daily  . vitamin B-12   1,000 mcg Oral Daily   Continuous Infusions: . cefTRIAXone (ROCEPHIN)  IV 1 g (11/01/19 0807)   PRN Meds:.ondansetron **OR** ondansetron (ZOFRAN) IV, sodium chloride flush  Allergies as of 10/24/2019 - Review Complete 10/24/2019  Allergen Reaction Noted  . Penicillins Anaphylaxis 12/10/2013    Family History  Problem Relation Age of Onset  . Dementia Mother     Social History   Socioeconomic History  . Marital status: Married    Spouse name: Not on file  . Number of children: Not on file  . Years of education: Not on file  . Highest education level: Not on file  Occupational History  . Not on file  Tobacco Use  . Smoking status: Current Every Day Smoker    Packs/day: 1.00    Years: 42.00    Pack years: 42.00    Types: Cigarettes  . Smokeless tobacco: Never Used  Substance and Sexual Activity  . Alcohol use: Yes    Comment: 4-5 beers a day  . Drug use: Yes    Types: Cocaine  . Sexual activity: Not on file  Other Topics Concern  . Not on file  Social History Narrative  . Not on file   Social Determinants of Health   Financial Resource Strain:   . Difficulty of Paying Living Expenses:   Food Insecurity:   . Worried About Charity fundraiser in the Last Year:   . Arboriculturist in the Last Year:   Transportation Needs:   . Film/video editor (Medical):   Marland Kitchen Lack of Transportation (Non-Medical):   Physical Activity:   . Days of Exercise per Week:   . Minutes of Exercise per Session:   Stress:   . Feeling of Stress :   Social Connections:   . Frequency of Communication with Friends and Family:   . Frequency of Social Gatherings with Friends and Family:   . Attends Religious Services:   . Active Member of Clubs or Organizations:   . Attends Archivist Meetings:   Marland Kitchen Marital Status:   Intimate Partner Violence:   . Fear of Current or Ex-Partner:   . Emotionally Abused:   Marland Kitchen Physically Abused:   . Sexually Abused:     Review of Systems: Not  able to obtain  Physical Exam: Vital signs: Vitals:   11/01/19 0502 11/01/19 0820  BP: (!) 155/80 (!) 162/85  Pulse: 79 75  Resp: 13 19  Temp: 97.7 F (36.5 C) 98.2 F (36.8 C)  SpO2: 100% 91%   Last BM Date: 10/30/19 General:   Elderly appearing patient, not in acute distress Lungs: No visible respiratory distress on anterior exam. Heart:  Regular rate and rhythm; no murmurs, clicks, rubs,  or gallops. Abdomen: Generalized discomfort on palpation particularly in the epigastric area, abdomen is soft, nondistended, bowel sounds present. NO LE edema  Psych. Not able to obtain Neuro. Confused Rectal:  Deferred  GI:  Lab Results: Recent Labs    11/01/19 0329  WBC 6.6  HGB 12.7*  HCT 37.9*  PLT 307   BMET Recent Labs    11/01/19 0329  NA 142  K 3.4*  CL 109  CO2 25  GLUCOSE 114*  BUN 16  CREATININE 0.68  CALCIUM 8.7*   LFT Recent Labs    11/01/19 0329  ALBUMIN 2.4*   PT/INR No results for input(s): LABPROT, INR in the last 72 hours.   Studies/Results: DG Swallowing Func-Speech Pathology  Result Date: 11/01/2019 Objective Swallowing Evaluation: Type of Study: MBS-Modified Barium Swallow Study  Patient Details Name: CAVIN TRIPPE MRN: [id number] Date of Birth: 09-May-1951 Today's Date: 11/01/2019 Time: SLP Start Time (ACUTE ONLY): 1027 -SLP Stop Time (ACUTE ONLY): 1052 SLP Time Calculation (min) (ACUTE ONLY): 25 min Past Medical History: Past Medical History: Diagnosis Date . Alcohol abuse  . Anxiety  . Breast cancer ([place]) Left breast  Per patient diagnosed in early 32's.  . Cocaine abuse ([place]) 08/19/2018 . Coronary artery disease  . High cholesterol  . Left rib fracture 08/19/2018 . Prostate cancer ([place])   per patient diagnosed in early 89's . Stroke [facility])  Past Surgical History: Past Surgical History: Procedure Laterality Date . CAROTID STENT   . CORONARY ARTERY BYPASS GRAFT   . FEMUR IM NAIL Left 07/02/2014  Procedure: INTRAMEDULLARY (IM) NAIL FEMORAL;  Surgeon: Elie Goody, MD;  [facility];  Service: Orthopedics;  Laterality: Left; . I & D EXTREMITY Right 02/10/2016  Procedure: IRRIGATION AND DEBRIDEMENT EXTREMITY;  Surgeon: Dorna Leitz, MD;  Location: [place];  Service: Orthopedics;  Laterality: Right; . TENDON REPAIR Right 02/10/2016  Procedure: Anterior TENDON REPAIR and wound exploration.;  Surgeon: Dorna Leitz, MD;  Location: [place];  Service: Orthopedics;  Laterality: Right; HPI: 69 yo male with CAD s/p CABG (2009), alcohol dependence with history of DTs/withdrawal, seizures, history of CVAs, and persistent anxiety. Admitted to IMTS on 10/24/19 for further evaluation and monitoring of chest pain, hematemesis, hematuria and alcohol withdrawal. Swallow eval ordered due to observed coughing when eating.  Pt well known to SLP service - tends to develop acute dysphagia related to mental stauts changes.  Subjective: sleepy Assessment / Plan / Recommendation CHL IP CLINICAL IMPRESSIONS 11/01/2019 Clinical Impression Pt presents with oropharyngeal dyspahgia characterized by prolonged mastication, reduced bolus cohesion, and reduced anterior laryngeal movement. He demonstrated premature spillage of liquids to the valleculae, occasional incomplete epiglottic inversion, and inconsistent penetratio (PAS 3) of nectar thick liquids with ultimate aspiration (PAS 7) secondary to incomplete epiglottic inversion. Esophageal screening revealed slow movement of the 61mm barium tablet through the thoracic esophagus with retention of barium, backflow to the cervical esophagus and pharynx, and aspiration (PAS 7) of backflowed material. Pt exhibited more difficulty with backflow when there was a solid bolus and subsequent aspiration retained in the esophagus. It is therefore recommended that the pt's diet be modified to clear liquids and that an esophageal assessment and/or GI consult be completed. RN an MD have been advised of results and these recommendations.  SLP Visit Diagnosis  Dysphagia, oropharyngeal phase (R13.12);Dysphagia, pharyngoesophageal phase (R13.14) Attention and concentration deficit following -- Frontal lobe and executive function deficit following -- Impact on safety and function Moderate aspiration risk   CHL IP TREATMENT RECOMMENDATION 11/01/2019 Treatment Recommendations Therapy as outlined in treatment plan below   Prognosis 11/01/2019 Prognosis for Safe Diet Advancement Good Barriers to Reach Goals Cognitive deficits Barriers/Prognosis Comment -- CHL IP DIET RECOMMENDATION 11/01/2019 SLP Diet Recommendations Thin liquid Liquid Administration via Cup;Straw Medication  Administration Whole meds with liquid Compensations Minimize environmental distractions;Slow rate;Small sips/bites Postural Changes Remain semi-upright after after feeds/meals (Comment);Seated upright at 90 degrees   CHL IP OTHER RECOMMENDATIONS 11/01/2019 Recommended Consults Consider GI evaluation;Consider esophageal assessment Oral Care Recommendations Oral care BID Other Recommendations --   CHL IP FOLLOW UP RECOMMENDATIONS 11/01/2019 Follow up Recommendations None   CHL IP FREQUENCY AND DURATION 11/01/2019 Speech Therapy Frequency (ACUTE ONLY) min 2x/week Treatment Duration 2 weeks      CHL IP ORAL PHASE 11/01/2019 Oral Phase Impaired Oral - Pudding Teaspoon -- Oral - Pudding Cup -- Oral - Honey Teaspoon -- Oral - Honey Cup -- Oral - Nectar Teaspoon -- Oral - Nectar Cup Decreased bolus cohesion;Delayed oral transit Oral - Nectar Straw Decreased bolus cohesion Oral - Thin Teaspoon -- Oral - Thin Cup Decreased bolus cohesion Oral - Thin Straw Decreased bolus cohesion Oral - Puree -- Oral - Mech Soft -- Oral - Regular Impaired mastication Oral - Multi-Consistency -- Oral - Pill -- Oral Phase - Comment --  CHL IP PHARYNGEAL PHASE 11/01/2019 Pharyngeal Phase Impaired Pharyngeal- Pudding Teaspoon -- Pharyngeal -- Pharyngeal- Pudding Cup -- Pharyngeal -- Pharyngeal- Honey Teaspoon -- Pharyngeal -- Pharyngeal- Honey  Cup -- Pharyngeal -- Pharyngeal- Nectar Teaspoon -- Pharyngeal -- Pharyngeal- Nectar Cup -- Pharyngeal -- Pharyngeal- Nectar Straw Reduced epiglottic inversion;Pharyngeal residue - pyriform;Penetration/Aspiration during swallow Pharyngeal Material enters airway, remains ABOVE vocal cords and not ejected out;Material enters airway, passes BELOW cords and not ejected out despite cough attempt by patient Pharyngeal- Thin Teaspoon -- Pharyngeal -- Pharyngeal- Thin Cup Penetration/Apiration after swallow;Trace aspiration;Pharyngeal residue - pyriform;Reduced anterior laryngeal mobility Pharyngeal Material enters airway, passes BELOW cords and not ejected out despite cough attempt by patient Pharyngeal- Thin Straw Penetration/Apiration after swallow;Trace aspiration;Pharyngeal residue - pyriform;Reduced anterior laryngeal mobility Pharyngeal Material enters airway, passes BELOW cords and not ejected out despite cough attempt by patient Pharyngeal- Puree Pharyngeal residue - pyriform;Reduced anterior laryngeal mobility Pharyngeal -- Pharyngeal- Mechanical Soft Pharyngeal residue - pyriform;Reduced anterior laryngeal mobility Pharyngeal -- Pharyngeal- Regular Pharyngeal residue - pyriform;Reduced anterior laryngeal mobility Pharyngeal -- Pharyngeal- Multi-consistency -- Pharyngeal -- Pharyngeal- Pill Pharyngeal residue - pyriform;Reduced anterior laryngeal mobility Pharyngeal -- Pharyngeal Comment --  CHL IP CERVICAL ESOPHAGEAL PHASE 11/01/2019 Cervical Esophageal Phase Impaired Pudding Teaspoon -- Pudding Cup -- Honey Teaspoon -- Honey Cup -- Nectar Teaspoon -- Nectar Cup Esophageal backflow into the pharynx Nectar Straw Esophageal backflow into the pharynx Thin Teaspoon -- Thin Cup Esophageal backflow into the pharynx Thin Straw Esophageal backflow into the pharynx Puree -- Mechanical Soft -- Regular -- Multi-consistency -- Pill Esophageal backflow into cervical esophagus Cervical Esophageal Comment -- Shanika I. Hardin Negus,  [place], [place] Office number [phone] Pager [place] 11/01/2019, 3:08 PM               Impression/Plan: -Atypical chest pain with epigastric abdominal pain along with hematemesis. Abnormal modified barium swallow. -History of coronary artery disease status post CABG in the past. -Dysphagia. Pharyngoesophageal  Recommendations -------------------------- -Patient with no evidence of ongoing bleeding at this time. LFTs normal. -Barium swallow ordered by primary team. We will follow the results. If abnormal, may need inpatient EGD. -Continue PPI for now -Diet advancement per speech therapy recommendation -GI will follow    LOS: 8 days   Otis Brace  MD, FACP 11/01/2019, 4:29 PM  Contact #  [phone]

## 2019-11-01 NOTE — Progress Notes (Signed)
NAME:  [name] Pace, MRN:  [id number], DOB:  [birth year], LOS: 8 ADMISSION DATE:  10/24/2019  Subjective/Interm history  No overnight events.  Significant Hospital Events   5/20 hospital admission 5/21 tolerating withdrawal well. No hematemesis  5/22 no significant events 5/23 no significant events 5/24 still requiring ativan; therapy recommending SNF. Decline in mental status 5/25 CIWAs improved to 1-5. Mental status continues to deteriorate. Librium/ativan stopped.  5/26 mental status improving today. No significant findings on RUQ [place]. Rocephin started for triple phosphate crystals>presumed proteus 5/27 continued improvement in mental status. Stable for discharge pending bed placement  5/28 awaiting bed placement. barrium swallow with retention in thorac esoph.>GI consulted  Objective   Blood pressure (!) 155/80, pulse 79, temperature 97.7 F (36.5 C), temperature source Oral, resp. rate 13, height 6\' 2"  (1.88 m), weight 58.5 kg, SpO2 100 %.     Intake/Output Summary (Last 24 hours) at 11/01/2019 0532 Last data filed at 10/31/2019 2300 Gross per 24 hour  Intake 180 ml  Output 975 ml  Net -795 ml     Examination: General: chronically ill appearing Cardiac: RRR Neuro: a/o. Still difficult to understand speech.   Significant Diagnostic Tests:  5/20 CXR>hyperinflation of lungs. Right sided pleuroparenchymal scarring.  5/20 CTA chest> borderline cardiomegaly. Left subclavian stent that is occluded. No aortic dissection. No PE. Emphysema bilaterally.  5/26 RUQ US>hepatosteatosis. No CBD dilation.  Micro Data:  UA>modate bilirubin, negative glucose, negative hgb, small # of leukocytes, negative nitirites, pH 8, +++protein, specific gravity 1.025, many bacteria  5/26 UC>NGTD  Antimicrobials:  Rocephin 5/26>  Summary  69 yo chronically ill male with CAD s/p CABG, ICM, and alcohol dependence with history of DTs/seizures who was admitted to IMTS on 10/24/19 for further  evaluation of chest pain, epigastric pain, hematemesis, and hematuria.  He was in active withdrawal on admission.Tolerated withdrawal well with CIWA/ativan/librium which were able to be discontinued 5/24.  No RBC on UA however he is also receiving empiric UTI treatment with rocephin (end date 5/30).   He underwent barium swallow study 5/28 which revealed some retention in the esophagus. GI has been consulted for further evaluation.  Assessment & Plan:  Principal Problem:   Atypical chest pain Active Problems:   Aortic atherosclerosis (HCC)   Emphysema of lung (HCC)   Alcohol dependence with withdrawal (HCC)   Protein-calorie malnutrition, severe   BMI less than 19,adult    Epigastric abdominal pain. GI vs MSK. continue PPI BID. voltaren gel to affected area. Esophageal dysmotility. Barrium swallow study indicating retention in the thoracic esoph with reflux to the pharynx>aspiration. Given that one of his presenting complaints was hematemesis, will get GI on board for further evaluation with esophagram vs EGD. Continue thin liquid diet as recommended by SLP. High risk malnutrition. Limited oral intake over hospitalization due to mental status and dysphagia. Nutrition on board and appreciate their recommendations. Continue vitamin supplementations.  Acute toxic encephalopathy. Continuing to improve. Continue holding central acting medications.  Urinary tract infection Plan: Rocephin (Tx day #3/5)  Physical deconditioning>Recurrent falls. Likely multifactorial in setting of ETOH use, subacute combined spinal column neurodegeneration, malnutrition and general deconditioning. Will require ongoing rehabilitation. Continue PT/OT   Chronic/stable problems  Chronic Occlusion of left subclavian stent. Discussed with vascular surgery who is not recommending further management at this time. Suspected PAD. Will need outpatient evaluation with ABIs Alcohol dependence with history of DTs/withdrawl  seizures. Withdrawal resolved.   Best practice:  CODE STATUS: full Diet: ADAT per SLP  recs DVT for prophylaxis: lovenox.  Social considerations/Family communication: per request Dispo: pending further evaluation by GI   Mitzi Hansen, MD [place] PGY-1 PAGER #: [phone] 11/01/19  5:32 AM

## 2019-11-02 MED ORDER — THIAMINE HCL 100 MG/ML IJ SOLN
100.0000 mg | Freq: Every day | INTRAMUSCULAR | Status: DC
  Administered 2019-11-03 – 2019-11-04 (×2): 100 mg via INTRAVENOUS
  Filled 2019-11-02 (×2): qty 2

## 2019-11-02 MED ORDER — ACETAMINOPHEN 325 MG PO TABS
650.0000 mg | ORAL_TABLET | Freq: Three times a day (TID) | ORAL | Status: DC | PRN
  Administered 2019-11-03 – 2019-11-04 (×2): 650 mg via ORAL
  Filled 2019-11-02 (×2): qty 2

## 2019-11-02 MED ORDER — SODIUM CHLORIDE 0.9 % IV SOLN: INTRAVENOUS | Status: DC

## 2019-11-02 MED ORDER — RAMELTEON 8 MG PO TABS
8.0000 mg | ORAL_TABLET | Freq: Every day | ORAL | Status: DC
  Administered 2019-11-02 – 2019-11-06 (×4): 8 mg via ORAL
  Filled 2019-11-02 (×6): qty 1

## 2019-11-02 MED ORDER — ACETAMINOPHEN 325 MG PO TABS
650.0000 mg | ORAL_TABLET | Freq: Once | ORAL | Status: AC
  Administered 2019-11-02: 650 mg via ORAL
  Filled 2019-11-02: qty 2

## 2019-11-02 NOTE — Progress Notes (Signed)
[facility] Gastroenterology Progress Note  [name] Pace 69 y.o. 03/22/1951  CC:   Abnormal modified barium swallow   Subjective: Patient seen and examined at bedside.  He is agitated.  Trying to get out of bed.  Confused.  Not able to obtain any history.  Discussed with RN.  ROS : Confused.  Not able to obtain review of system   Objective: Vital signs in last 24 hours: Vitals:   11/02/19 0908 11/02/19 0919  BP: (!) 120/56 (!) 140/57  Pulse: 60 60  Resp: 19 16  Temp:    SpO2: 97% 99%    Physical Exam:  General.  Agitated.  No acute distress Abdomen is soft, nontender, nondistended, bowel sounds present. Neuro.  Confused Psych.  Not able to evaluate  Lab Results: Recent Labs    11/01/19 0329  NA 142  K 3.4*  CL 109  CO2 25  GLUCOSE 114*  BUN 16  CREATININE 0.68  CALCIUM 8.7*  PHOS 3.3   Recent Labs    11/01/19 0329  ALBUMIN 2.4*   Recent Labs    11/01/19 0329  WBC 6.6  NEUTROABS 4.3  HGB 12.7*  HCT 37.9*  MCV 108.0*  PLT 307   No results for input(s): LABPROT, INR in the last 72 hours.    Assessment/Plan: -Atypical chest pain with epigastric abdominal pain along with hematemesis. Abnormal modified barium swallow. -History of coronary artery disease status post CABG in the past. -Dysphagia. Pharyngoesophageal  Recommendations -------------------------- -Patient is agitated and confused.  I do not think he will cooperate with barium swallow.  Barium swallow may not get done in the weekend. -Called and discussed with patient's brother [name] over the phone.  Need for upper endoscopy for further evaluation of his esophagus discussed.  Risk, benefits and alternative also discussed.  Verbal consent for procedure obtained from patient's brother.  Discussed with RN.  -Okay to try soft diet today if okay with speech pathology -Keep n.p.o. past midnight -EGD tomorrow  Risks (bleeding, infection, bowel perforation that could require surgery,  sedation-related changes in cardiopulmonary systems), benefits (identification and possible treatment of source of symptoms, exclusion of certain causes of symptoms), and alternatives (watchful waiting, radiographic imaging studies, empiric medical treatment)  were explained to family in detail and patient wishes to proceed.   Otis Brace MD, [place] 11/02/2019, 11:29 AM  Contact #  [phone]

## 2019-11-02 NOTE — Progress Notes (Addendum)
NAME:  SHANNON SODERMAN, MRN:  [id number], DOB:  1950-08-02, LOS: 9 ADMISSION DATE:  10/24/2019  Subjective/Interm history  No overnight events.  Patient reports that he was wanting to have a beer today, we discussed that we can't give him that here however we can give him medications to decrease his cravings, naltrexone. Discussed that he is having issues with his swallowing that we are trying to figure out before we can give him oral medications. He reports that he thought he was at home.   Significant Hospital Events   5/20 hospital admission 5/21 tolerating withdrawal well. No hematemesis  5/22 no significant events 5/23 no significant events 5/24 still requiring ativan; therapy recommending SNF. Decline in mental status 5/25 CIWAs improved to 1-5. Mental status continues to deteriorate. Librium/ativan stopped.  5/26 mental status improving today. No significant findings on RUQ [place]. Rocephin started for triple phosphate crystals>presumed proteus 5/27 continued improvement in mental status. Stable for discharge pending bed placement 5/28 modified barium swallow showing esoph retention>reflux>aspiration. GI consulted 5/29 no significant events. GI planning for EGD tomorrow Objective   Blood pressure (!) 140/57, pulse 60, temperature 98.9 F (37.2 C), temperature source Oral, resp. rate 16, height 6\' 2"  (1.88 m), weight 58.3 kg, SpO2 99 %.     Intake/Output Summary (Last 24 hours) at 11/02/2019 1224 Last data filed at 11/02/2019 1135 Gross per 24 hour  Intake 363 ml  Output 250 ml  Net 113 ml     Examination: General: chronically ill appearing Cardiac: RRR Pulm: significant coughing-nonproductive. Upper respiratory sounds on auscultation. Abd: epigastric tenderness to palpation Neuro: alert. Not oriented to place or situation this morning. No tremors.  Consults:  none  Significant Diagnostic Tests:  5/20 CXR>hyperinflation of lungs. Right sided pleuroparenchymal scarring.   5/20 CTA chest> borderline cardiomegaly. Left subclavian stent that is occluded. No aortic dissection. No PE. Emphysema bilaterally.  5/26 RUQ US>hepatosteatosis. No CBD dilation. 5/28 modified barium swallow study>esophogeal retention with pharyngeal refu  Micro Data:  UA>modate bilirubin, negative glucose, negative hgb, small # of leukocytes, negative nitirites, pH 8, +++protein, specific gravity 1.025, many bacteria  Antimicrobials:  5/26 rocephin>  Summary  69 yo chronically ill male with CAD s/p CABG, ICM, and alcohol dependence with history of DTs/seizures who was admitted to IMTS on 10/24/19 for further evaluation of chest pain, epigastric pain, hematemesis, and hematuria. Tolerated withdrawal well with CIWA/ativan/librium which were able to be discontinued 5/24. No RBC on UA and no episodes of hematemesis this admission. We began empiric treatment for UTI with rocephin on 5/26.  Mental status improved 5/26. He is also severely debilitated and we are awaiting SNF bed placement for ongoing rehabilitation.  Assessment & Plan:  Principal Problem:   Atypical chest pain Active Problems:   Aortic atherosclerosis (HCC)   Emphysema of lung (HCC)   Alcohol dependence with withdrawal (HCC)   Protein-calorie malnutrition, severe   BMI less than 19,adult    Epigastric abdominal pain. Likely ETOH gastritis vs MSK. No acute findings on RUQ [place]. Pharyngoesophageal Dysphagia. 5/28 modified barrium showing retention in esophagus followed by reflux to pharynx and aspiration. GI on board.  Plan: EGD tomorrow. Npo after mn.  Acute toxic encephalopathy. Seems slightly more encephalopathic this morning. Alcohol dependence. Pt endorsing craving for beer this morning. Consider starting naltrexone once able to swallow pills.  Bacturia UA negative for blood however did reveal triple phosphate crystals indicative of struvite stone. Given the presence of bacteria and slightly basic pH, may be  proteus  infection. Plan: Rocephin (Tx day #3/5)  Physical deconditioning>Recurrent falls. Likely multifactorial in setting of ETOH use, subacute combined spinal column neurodegeneration, malnutrition and general deconditioning. Will require ongoing rehabilitation. Continue PT/OT.   Chronic/stable problems  Chronic Occlusion of left subclavian stent. Discussed with vascular surgery who is not recommending further management at this time.  Suspected PAD. Will need outpatient evaluation with ABIs  Best practice:  CODE STATUS: full Diet: per SLP/GI recs DVT for prophylaxis: lovenox.  Social considerations/Family communication: per request Dispo: pending further GI eval   Mitzi Hansen, MD [place] PGY-1 PAGER #: [phone] 11/02/19  12:24 PM

## 2019-11-02 NOTE — Progress Notes (Signed)
Event: Paged by RN that patient agitated, trying to get out of bed  Evaluation: Evaluated patient at bedside. Patient was not aggressive and expressed frustration with interruptions but remained in bed. Patient complains of headache. Patient was not aware of reason for hospitalization, was aware he was in [place], was not oriented to year. Physical exam without significant abnormality. Unable to assess for asterixis due to patient non-compliance with exam.  Assessment/Plan: Patient with intermittent confusion/agitation, intermittently attempting to get out of bed.  * Tylenol 650 mg x1 for headache, can give again x1 in 8 hours. Will not exceed 2 g in 24 hour period given hepatic function.  *Ramelteon for sleep *Requested RN to contact for repeat agitation  Jeanmarie Hubert, MD 11/02/2019, 8:22 PM Pager: [phone]

## 2019-11-03 ENCOUNTER — Encounter (HOSPITAL_COMMUNITY): Payer: Self-pay | Admitting: Internal Medicine

## 2019-11-03 ENCOUNTER — Inpatient Hospital Stay (HOSPITAL_COMMUNITY): Payer: Medicare Other | Admitting: Certified Registered Nurse Anesthetist

## 2019-11-03 ENCOUNTER — Encounter (HOSPITAL_COMMUNITY)
Admission: EM | Disposition: A | Discharge status: Skilled Nursing Facility | Payer: Self-pay | Source: Home / Self Care | Attending: Internal Medicine

## 2019-11-03 HISTORY — PX: ESOPHAGOGASTRODUODENOSCOPY (EGD) WITH PROPOFOL: SHX5813

## 2019-11-03 HISTORY — PX: BIOPSY: SHX5522

## 2019-11-03 LAB — URINE CULTURE
Culture: 20000 — AB
Special Requests: NORMAL

## 2019-11-03 LAB — BASIC METABOLIC PANEL
Anion gap: 13 (ref 5–15)
BUN: 12 mg/dL (ref 8–23)
CO2: 20 mmol/L — ABNORMAL LOW (ref 22–32)
Calcium: 8.5 mg/dL — ABNORMAL LOW (ref 8.9–10.3)
Chloride: 110 mmol/L (ref 98–111)
Creatinine, Ser: 0.65 mg/dL (ref 0.61–1.24)
GFR calc Af Amer: >60 mL/min (ref >60)
GFR calc non Af Amer: >60 mL/min (ref >60)
Glucose, Bld: 93 mg/dL (ref 70–99)
Potassium: 4.7 mmol/L (ref 3.5–5.1)
Sodium: 143 mmol/L (ref 135–145)

## 2019-11-03 SURGERY — ESOPHAGOGASTRODUODENOSCOPY (EGD) WITH PROPOFOL
Anesthesia: Monitor Anesthesia Care

## 2019-11-03 MED ORDER — LACTATED RINGERS IV SOLN: INTRAVENOUS | Status: DC

## 2019-11-03 MED ORDER — PROPOFOL 500 MG/50ML IV EMUL
INTRAVENOUS | Status: DC | PRN
  Administered 2019-11-03: 100 ug/kg/min via INTRAVENOUS

## 2019-11-03 MED ORDER — LACTATED RINGERS IV SOLN
INTRAVENOUS | Status: DC | PRN
Start: 2019-11-03 — End: 2019-11-03
  Administered 2019-11-03: 1000 mL via INTRAVENOUS

## 2019-11-03 MED ORDER — LIDOCAINE HCL (CARDIAC) PF 100 MG/5ML IV SOSY
PREFILLED_SYRINGE | INTRAVENOUS | Status: DC | PRN
  Administered 2019-11-03: 60 mg via INTRATRACHEAL

## 2019-11-03 MED ORDER — PANTOPRAZOLE SODIUM 40 MG PO TBEC
40.0000 mg | DELAYED_RELEASE_TABLET | Freq: Every day | ORAL | Status: DC
  Administered 2019-11-03 – 2019-11-07 (×5): 40 mg via ORAL
  Filled 2019-11-03 (×4): qty 1

## 2019-11-03 SURGICAL SUPPLY — 15 items

## 2019-11-03 NOTE — Progress Notes (Signed)
[facility] Gastroenterology Progress Note  [name] Pace 69 y.o. September 11, 1950  CC:   Abnormal modified barium swallow   Subjective: Patient seen and examined at bedside in the endoscopy unit.  He is restful today and able to provide some meaningful history.  He was also able to describe procedure EGD to [place] today.    Objective: Vital signs in last 24 hours: Vitals:   11/03/19 0410 11/03/19 0849  BP: (!) 158/83 (!) 143/83  Pulse: (!) 56 61  Resp: 18 12  Temp: 98.5 F (36.9 C) (!) 97.5 F (36.4 C)  SpO2: 97% 100%    Physical Exam:  General.  Agitated.  No acute distress Abdomen is soft, nontender, nondistended, bowel sounds present. Heart.  Rate rhythm regular, Chest.  Clear to auscultate bilaterally.  Anterior exam only  Lab Results: Recent Labs    11/01/19 0329 11/03/19 0747  NA 142 143  K 3.4* 4.7  CL 109 110  CO2 25 20*  GLUCOSE 114* 93  BUN 16 12  CREATININE 0.68 0.65  CALCIUM 8.7* 8.5*  PHOS 3.3  --    Recent Labs    11/01/19 0329  ALBUMIN 2.4*   Recent Labs    11/01/19 0329  WBC 6.6  NEUTROABS 4.3  HGB 12.7*  HCT 37.9*  MCV 108.0*  PLT 307   No results for input(s): LABPROT, INR in the last 72 hours.    Assessment/Plan: -Atypical chest pain with epigastric abdominal pain along with hematemesis. Abnormal modified barium swallow. -History of coronary artery disease status post CABG in the past. -Dysphagia. Pharyngoesophageal  Recommendations -------------------------- -Patient is alert and somewhat oriented today.  He understands what procedure we will be doing today.  Possibility of dilation if any narrowing, stricture or ring discussed.  Procedure was discussed with patient's brother over the phone yesterday.  -Proceed with EGD with possible dilation  Risks (bleeding, infection, bowel perforation that could require surgery, sedation-related changes in cardiopulmonary systems), benefits (identification and possible treatment of source of  symptoms, exclusion of certain causes of symptoms), and alternatives (watchful waiting, radiographic imaging studies, empiric medical treatment)  were explained to patient and family in detail and patient wishes to proceed.   Otis Brace MD, [employer] 11/03/2019, 11:50 AM  Contact #  [phone]

## 2019-11-03 NOTE — Op Note (Signed)
[facility] Patient Name: [name] Pace Procedure Date : 11/03/2019 MRN: [id number] Attending MD: Otis Brace , MD Date of Birth: [birth year] CSN: [id number] Age: 69 Admit Type: Inpatient Procedure:                Upper GI endoscopy Indications:              Pharyngeal phase dysphagia Providers:                Otis Brace, MD, Cleda Daub, RN, William Dalton, Technician Referring MD:              Medicines:                Sedation Administered by an Anesthesia Professional Complications:            No immediate complications. Estimated Blood Loss:     Estimated blood loss was minimal. Procedure:                Pre-Anesthesia Assessment:                           - Prior to the procedure, a History and Physical                            was performed, and patient medications and                            allergies were reviewed. The patient's tolerance of                            previous anesthesia was also reviewed. The risks                            and benefits of the procedure and the sedation                            options and risks were discussed with the patient.                            All questions were answered, and informed consent                            was obtained. Prior Anticoagulants: The patient has                            taken no previous anticoagulant or antiplatelet                            agents. ASA Grade Assessment: III - A patient with                            severe systemic disease. After reviewing the risks  and benefits, the patient was deemed in                            satisfactory condition to undergo the procedure.                           After obtaining informed consent, the endoscope was                            passed under direct vision. Throughout the                            procedure, the patient's blood pressure, pulse, and                  oxygen saturations were monitored continuously. The                            [id number]) Olympus gastroscope was                            introduced through the mouth, and advanced to the                            second part of duodenum. The upper GI endoscopy was                            accomplished without difficulty. The patient                            tolerated the procedure well. Scope In: Scope Out: Findings:      The Z-line was regular and was found 43 cm from the incisors.      Abnormal motility was noted in the esophagus. There is spasticity of the       esophageal body.      There is no endoscopic evidence of stricture in the entire esophagus.      A few small sessile polyps were found in the gastric body. Biopsies were       taken with a cold forceps for histology.      The cardia and gastric fundus were normal on retroflexion.      A few 3 to 7 mm sessile polyps were found in the duodenal bulb. Biopsies       were taken with a cold forceps for histology.      The first portion of the duodenum and second portion of the duodenum       were normal. Impression:               - Z-line regular, 43 cm from the incisors.                           - Abnormal esophageal motility.                           - A few gastric polyps. Biopsied.                           -  A few duodenal polyps. Biopsied.                           - Normal first portion of the duodenum and second                            portion of the duodenum. Recommendation:           - Return patient to hospital ward for ongoing care.                           - Soft diet.                           - Continue present medications.                           - Await pathology results. Procedure Code(s):        --- Professional ---                           [phone], Esophagogastroduodenoscopy, flexible,                            transoral; with biopsy, single or multiple Diagnosis  Code(s):        --- Professional ---                           K22.4, Dyskinesia of esophagus                           K31.7, Polyp of stomach and duodenum                           R13.13, Dysphagia, pharyngeal phase CPT copyright 2019 American Medical Association. All rights reserved. The codes documented in this report are preliminary and upon coder review may  be revised to meet current compliance requirements. Otis Brace, MD Otis Brace, MD 11/03/2019 12:31:19 PM Number of Addenda: 0

## 2019-11-03 NOTE — Anesthesia Procedure Notes (Signed)
Procedure Name: MAC Date/Time: 11/03/2019 12:12 PM Performed by: Kathryne Hitch, CRNA Pre-anesthesia Checklist: Patient identified, Emergency Drugs available, Suction available and Patient being monitored Patient Re-evaluated:Patient Re-evaluated prior to induction Oxygen Delivery Method: Nasal cannula Preoxygenation: Pre-oxygenation with 100% oxygen Induction Type: IV induction Dental Injury: Teeth and Oropharynx as per pre-operative assessment

## 2019-11-03 NOTE — Anesthesia Preprocedure Evaluation (Signed)
Anesthesia Evaluation  Patient identified by MRN, date of birth, ID band Patient awake    Reviewed: Allergy & Precautions, NPO status , Patient's Chart, lab work & pertinent test results  Airway Mallampati: II  TM Distance: >3 FB Neck ROM: Full    Dental  (+) Dental Advisory Given, Edentulous Upper, Edentulous Lower   Pulmonary COPD, Current Smoker,    breath sounds clear to auscultation       Cardiovascular + CAD, + Cardiac Stents, + CABG and + Peripheral Vascular Disease   Rhythm:Regular Rate:Normal     Neuro/Psych PSYCHIATRIC DISORDERS Anxiety CVA    GI/Hepatic (+)     substance abuse  alcohol use and cocaine use,   Endo/Other  negative endocrine ROS  Renal/GU negative Renal ROS  negative genitourinary   Musculoskeletal negative musculoskeletal ROS (+)   Abdominal   Peds negative pediatric ROS (+)  Hematology negative hematology ROS (+)   Anesthesia Other Findings   Reproductive/Obstetrics negative OB ROS                             Anesthesia Physical  Anesthesia Plan  ASA: III  Anesthesia Plan: MAC   Post-op Pain Management:    Induction:   PONV Risk Score and Plan: Ondansetron and Propofol infusion  Airway Management Planned: Mask and Natural Airway  Additional Equipment:   Intra-op Plan:   Post-operative Plan:   Informed Consent: I have reviewed the patients History and Physical, chart, labs and discussed the procedure including the risks, benefits and alternatives for the proposed anesthesia with the patient or authorized representative who has indicated his/her understanding and acceptance.     Dental advisory given  Plan Discussed with: CRNA and Anesthesiologist  Anesthesia Plan Comments:         Anesthesia Quick Evaluation

## 2019-11-03 NOTE — Progress Notes (Signed)
Patient taken to Endo for EGD.

## 2019-11-03 NOTE — Progress Notes (Signed)
   Subjective: Patient reports that he was hungry, we discussed that he will be having a procedure later today to evaluate his swallowing function. He also reports that he is having some arm pains where the IVs have been, as well as generalized pain from hunger. He denies any other issues.   Objective:  Vital signs in last 24 hours: Vitals:   11/02/19 1100 11/02/19 2023 11/03/19 0049 11/03/19 0410  BP: 139/69 (!) 146/84 (!) 149/62 (!) 158/83  Pulse: 69 67 (!) 52 (!) 56  Resp:  18 (!) 23 18  Temp: 97.6 F (36.4 C) 98.4 F (36.9 C) 98.1 F (36.7 C) 98.5 F (36.9 C)  TempSrc: Oral Oral Axillary Axillary  SpO2:  99% 97% 97%  Weight:    59.1 kg  Height:       General: Elderly male, intermittent agitation, laying in bed, safety mittens in place Cardiac: RRR, no m/r/g Pulmonary: CTABL, no wheezing, rhonchi, rales Abdomen: Soft, TTP in suprapubic area, no guarding Neuro: Alert, oriented x3, moves all extremities Psych: Intermittent agitation  Assessment/Plan:  Principal Problem:   Atypical chest pain Active Problems:   Aortic atherosclerosis (HCC)   Emphysema of lung (HCC)   Alcohol dependence with withdrawal (HCC)   Protein-calorie malnutrition, severe   BMI less than 19,adult  This is a 69 year old male with a history of CAD s/p CABG, HTN, EtOH use, Cocaine use, and housing instability who presented with chest pain, symptoms of EtOH withdrawal, hematemesis and hematuria. Past EtOH withdrawal and subsequently noted to have dysphagia.   Pharyngoesophageal Dysphagia: Epigastric pain, hematemesis: Dysphagia noted during admission, abnormal barium swallow. Unlikely to tolerate modified barium so GI taking for EGD today.   -Gi following, appreciate assistance -F/u EGD -NPO for now  Bacturia UA negative for blood however did reveal triple phosphate crystals indicative of struvite stone. Given the presence of bacteria and slightly basic pH, may be proteus infection.  -Rocephin  (Tx day 4/5)  Reported hematuria: -Reported by patient on admission, urinalysis negative for hematuria.  We will continue to monitor for now.  Hemoglobin has been stable and vital signs have been stable.  Acute toxic encephalopathy secondary to EtOh withdrawal: Last drink 2 days PTA. Had received ativan and librium, now past withdrawal phase. Reported feeling well off EtOH.  Longer requiring Ativan . -Encouraged EtOH cessation.   Generalized deconditioning: Likely multifactorial in setting of EtOH use, subacute combined spinal column neurodegeneration, malnutrition and general deconditioning.  -PT/OT, recommending SNF -CSW assisting with SNF placement  Prior to Admission Living Arrangement: Home Anticipated Discharge Location: SNF Barriers to Discharge: Continued medical management Dispo: Anticipated discharge in approximately 2-3 day(s).   Asencion Noble, MD 11/03/2019, 7:05 AM Pager: [phone]

## 2019-11-03 NOTE — Brief Op Note (Signed)
10/24/2019 - 11/03/2019  12:34 PM  PATIENT:  Peggyann Juba  69 y.o. male  PRE-OPERATIVE DIAGNOSIS:  Abnormal barium swallow, epigastric pain  POST-OPERATIVE DIAGNOSIS:  * No post-op diagnosis entered *  PROCEDURE:  Procedure(s): ESOPHAGOGASTRODUODENOSCOPY (EGD) WITH PROPOFOL with possible dilation (N/A)  SURGEON:  Surgeon(s) and Role:    * Shaheen Mende, MD - Primary  Findings ----------- -EGD showed significant esophageal spasm but no evidence of esophageal stricture.  Few gastric and duodenal bulb polyps.  Biopsies performed.  Recommendations -------------------------- -Start soft diet.  Advance as tolerated -Follow pathology results -Change Protonix to once a day -Findings discussed with patient's brother. -No further inpatient GI work-up planned.  GI will sign off.  Call us back if needed  Otis Brace MD, [place] 11/03/2019, 12:35 PM  Contact #  [phone]

## 2019-11-03 NOTE — Progress Notes (Signed)
Patient returned from EGD at 1346hrs.  Alert and oriented to self, denies pain, swallowing without problems.

## 2019-11-03 NOTE — Transfer of Care (Signed)
Immediate Anesthesia Transfer of Care Note  Patient: [name] Pace  Procedure(s) Performed: ESOPHAGOGASTRODUODENOSCOPY (EGD) WITH PROPOFOL with possible dilation (N/A )  Patient Location: PACU  Anesthesia Type:MAC  Level of Consciousness: drowsy and patient cooperative  Airway & Oxygen Therapy: Patient Spontanous Breathing and Patient connected to nasal cannula oxygen  Post-op Assessment: Report given to RN and Post -op Vital signs reviewed and stable  Post vital signs: Reviewed and stable  Last Vitals:  Vitals Value Taken Time  BP 113/65 11/03/19 1236  Temp    Pulse 114 11/03/19 1236  Resp 20 11/03/19 1236  SpO2 90 % 11/03/19 1236  Vitals shown include unvalidated device data.  Last Pain:  Vitals:   11/03/19 1203  TempSrc: Oral  PainSc: 0-No pain      Patients Stated Pain Goal: 0 (97/58/83 2549)  Complications: No apparent anesthesia complications

## 2019-11-03 NOTE — Anesthesia Postprocedure Evaluation (Signed)
Anesthesia Post Note  Patient: [name] Pace  Procedure(s) Performed: ESOPHAGOGASTRODUODENOSCOPY (EGD) WITH PROPOFOL with possible dilation (N/A ) BIOPSY     Patient location during evaluation: PACU Anesthesia Type: MAC Level of consciousness: awake and alert Pain management: pain level controlled Vital Signs Assessment: post-procedure vital signs reviewed and stable Respiratory status: spontaneous breathing and respiratory function stable Cardiovascular status: stable Postop Assessment: no apparent nausea or vomiting Anesthetic complications: no    Last Vitals:  Vitals:   11/03/19 1305 11/03/19 1320  BP: (!) 142/74 (!) 149/70  Pulse: 61 61  Resp: 19 20  Temp:  36.7 C  SpO2: 100% 100%    Last Pain:  Vitals:   11/03/19 1305  TempSrc:   PainSc: 0-No pain                 Airyanna Dipalma DANIEL

## 2019-11-04 MED ORDER — UMECLIDINIUM BROMIDE 62.5 MCG/INH IN AEPB
1.0000 | INHALATION_SPRAY | Freq: Every day | RESPIRATORY_TRACT | Status: DC
  Administered 2019-11-05 – 2019-11-07 (×3): 1 via RESPIRATORY_TRACT
  Filled 2019-11-04: qty 7

## 2019-11-04 MED ORDER — THIAMINE HCL 100 MG PO TABS
100.0000 mg | ORAL_TABLET | Freq: Every day | ORAL | Status: DC
  Administered 2019-11-05 – 2019-11-07 (×3): 100 mg via ORAL
  Filled 2019-11-04 (×3): qty 1

## 2019-11-04 NOTE — TOC Progression Note (Signed)
Transition of Care [facility]) - Progression Note    Patient Details  Name: [name] Pace MRN: [id number] Date of Birth: 05-Nov-1950  Transition of Care [facility]) CM/SW [place], [place] Phone Number: 11/04/2019, 10:19 AM  Clinical Narrative:     CSW tried to call [name] with [facility] to follow up on medical review for possible insurance authorization approval for SNF placement at [facility]. Insurance office is closed for [employer]. CSW will follow up with her tomorrow 6/1.  CSW will continue to follow.  Expected Discharge Plan: [place] Barriers to Discharge: Continued Medical Work up  Expected Discharge Plan and Services Expected Discharge Plan: [place] arrangements for the past 2 months: Single Family Home                                       Social Determinants of Health (SDOH) Interventions    Readmission Risk Interventions Readmission Risk Prevention Plan 04/10/2019  Transportation Screening Complete  Medication Review [employer]) Complete  PCP or Specialist appointment within 3-5 days of discharge Complete  [place]RI or [place] Complete  SW Recovery Care/Counseling Consult Complete  [place] Patient Refused  Some recent data might be hidden

## 2019-11-04 NOTE — Progress Notes (Signed)
  Speech Language Pathology Treatment: Dysphagia  Patient Details Name: RAGAN REALE MRN: [id number] DOB: 02/27/1951 Today's Date: 11/04/2019 Time: 1530-1550 SLP Time Calculation (min) (ACUTE ONLY): 20 min  Assessment / Plan / Recommendation Clinical Impression  Pt underwent EGD on 5/30, which revealed esophageal dysmotility and spasticity. This physiological impairment would explain the backflow of barium and subsequent aspiration noted on 5/28 MBS.  Pt was started on a soft diet, thin liquids per GI.  Functionally, he appears to be tolerating POs better.  Today during our session he had no coughing/wet voice after PO consumption; no overt s/s of aspiration. He continued to point to base of throat noting that material "catches." We reviewed results of MBS and EGD in basic terms, discussed some strategies that can help, and talked about the reality that motility problems in the esophagus are difficult to rectify.  Pt with inconsistent understanding of discussion and recalls only portions of information presented.  He will continue to wax/wane with regard to overall swallowing and aspiration risk. His potential to develop adverse consequences of aspiration will be partially dependent upon his cognitive status.  He is likely approaching baseline function.  Recommend continuing soft diet, thin liquids as tolerated.  There are no further acute care SLP needs. D/W pt and RN. Will sign off.    HPI HPI: 69 yo male with CAD s/p CABG (2009), alcohol dependence with history of DTs/withdrawal, seizures, history of CVAs, and persistent anxiety. Admitted to IMTS on 10/24/19 for further evaluation and monitoring of chest pain, hematemesis, hematuria and alcohol withdrawal. Swallow eval ordered due to observed coughing when eating.  Pt well known to SLP service - tends to develop acute dysphagia related to mental status changes.         SLP Plan  All goals met       Recommendations  Diet recommendations:  Dysphagia 3 (mechanical soft);Thin liquid Liquids provided via: Cup;Straw Medication Administration: Crushed with puree Supervision: Staff to assist with self feeding;Intermittent supervision to cue for compensatory strategies Compensations: Minimize environmental distractions;Follow solids with liquid Postural Changes and/or Swallow Maneuvers: Seated upright 90 degrees;Upright 30-60 min after meal                Oral Care Recommendations: Oral care BID Follow up Recommendations: None SLP Visit Diagnosis: Dysphagia, oropharyngeal phase (R13.12);Dysphagia, pharyngoesophageal phase (R13.14) Plan: All goals met       GO                Juan Quam Laurice 11/04/2019, 4:07 PM  Landry Kamath L. Tivis Ringer, [place] Office number [phone] Pager [phone]

## 2019-11-04 NOTE — Progress Notes (Signed)
TELEPHONE NOTE:  Daughter updated today regarding pts current medical treatment and plans for discharge. She agrees strongly that he will need continued rehabilitation and she would ideally like to see him stay at a skilled facility permanently. We discussed that this can be further evaluated during his stay with rehab. All questions were answered and she has no further questions at this time.  Mitzi Hansen, MD 11/04/19 12:57 PM

## 2019-11-04 NOTE — Progress Notes (Signed)
NAME:  [name] Pace, MRN:  [id number], DOB:  21-Feb-1951, LOS: 11 ADMISSION DATE:  10/24/2019  Subjective/Interm history  No overnight events. Mental status much improved. Wanting a cigarette this morning.  Significant Hospital Events   5/20 hospital admission 5/21 tolerating withdrawal well. No hematemesis  5/22 no significant events 5/23 no significant events 5/24 still requiring ativan; therapy recommending SNF. Decline in mental status 5/25 CIWAs improved to 1-5. Mental status continues to deteriorate. Librium/ativan stopped.  5/26 mental status improving today. No significant findings on RUQ [place]. Rocephin started for triple phosphate crystals>presumed proteus 5/27 continued improvement in mental status. Stable for discharge pending bed placement 5/28 modified barium swallow showing esoph retention>reflux>aspiration. GI consulted 5/29 no significant events. GI planning for EGD tomorrow 5/30 EGD showing esophageal spasm and gastric polyps 5/31 awaiting placement. Objective   Blood pressure (!) 149/72, pulse 61, temperature 98 F (36.7 C), temperature source Oral, resp. rate 14, height 6\' 2"  (1.88 m), weight 60.2 kg, SpO2 99 %.     Intake/Output Summary (Last 24 hours) at 11/04/2019 0542 Last data filed at 11/03/2019 2111 Gross per 24 hour  Intake 645.66 ml  Output 350 ml  Net 295.66 ml     Examination: General: chronically ill appearing Cardiac: RRR Abd: epigastric tenderness to palpation. bs active.  Neuro: a/o x3.   Consults:  GI 5/29>5/30  Significant Diagnostic Tests:  5/20 CXR>hyperinflation of lungs. Right sided pleuroparenchymal scarring. 5/20 CTA chest> borderline cardiomegaly. Left subclavian stent that is occluded. No aortic dissection. No PE. Emphysema bilaterally.  5/26 RUQ US>hepatosteatosis. No CBD dilation. 5/28 modified barium swallow study>esophogeal retention with pharyngeal reflux and subsequent aspiration 5/30 EGD>abnormal motility in esophagus  with spasticity. No esoph stricture. Few small sessile polyps in gastric body--biopsies obtained. Few 3-35mm sessile polyps in duodenal bulb--biopsies obtained.  Micro Data:  UA>modate bilirubin, negative glucose, negative hgb, small # of leukocytes, negative nitirites, pH 8, +++protein, specific gravity 1.025, many bacteria  Antimicrobials:  Rocephin 5/26>5/29  Summary  69 yo chronically ill male with CAD s/p CABG, ICM, and alcohol dependence with history of DTs/seizures who was admitted to IMTS on 10/24/19 for further evaluation of chest pain, epigastric pain, hematemesis, and hematuria.   He was found to be withdrawing from alcohol on admission which was the focus of the first few days of hospitalization. Tolerated withdrawal well with ativan/librium which were able to be discontinued 5/24.   We obtained a UA for evaluation of his reported hematuria. This did not show RBCs however did have bacturia and triple phosphate crystals which can be consistent with a proteus infection. He completed a 5d course of rocephin for this.  Throughout his hospitalization, he continued to experience epigastric abdominal pain. SLP was consulted who did a modified barrium eval which showed retention in the esophagus followed by reflux and aspiration. GI was consulted and he underwent an EGD on 5/30 which showed a spastic esophagus along with some polyps in the stomach and duodenum which were biopsied. We are currently awaiting those results.   He is also severely debilitated and we are awaiting SNF bed placement for ongoing rehabilitation.  Assessment & Plan:  Principal Problem:   Atypical chest pain Active Problems:   Aortic atherosclerosis (HCC)   Emphysema of lung (HCC)   Alcohol dependence with withdrawal (HCC)   Protein-calorie malnutrition, severe   BMI less than 19,adult   Epigastric abdominal pain.  Pharyngoesophageal Dysphagia. 5/28 modified barrium showing retention in esophagus followed by  reflux to pharynx  and aspiration. GI consulted and underwent EGD on 5/30 with the below findings. Esophageal spasticity Gastric and duodenal polpys. Biopsies obtained on 5/30 EGD Plan: follow biospy results. Soft diet. Will hold off on starting diltiazem at this time due to some bradycardia however recommended dose is  60-90mg  4x daily for spasm.   Acute toxic encephalopathy. Mental status quite improved from Friday.  Resolved hospital problems  Bacturia UA negative for blood however did reveal triple phosphate crystals indicative of struvite stone. Given the presence of bacteria and slightly basic pH, may be proteus infection. Received 5d course of rocephin completed 5/29. ETOH withdrawal. Uneventful withdrawal with assistance of librium and ativan.  Chronic/stable problems  Chronic Occlusion of left subclavian stent. Discussed with vascular surgery who is not recommending further management at this time. Suspected PAD. Will need outpatient evaluation with ABIs Alcohol dependence. Would likely benefit from outpatient treatment after physical rehab complete. Consider starting naltrexone for cravings. Physical deconditioning>Recurrent falls. Likely multifactorial in setting of ETOH use, subacute combined spinal column neurodegeneration, malnutrition and general deconditioning. Will require ongoing rehabilitation. Continue PT/OT.  Best practice:  CODE STATUS: full Diet: soft DVT for prophylaxis: lovenox.  Social considerations/Family communication: per request Dispo: pending bed availability   Mitzi Hansen, MD [place] PGY-1 PAGER #: [phone] 11/04/19  5:42 AM

## 2019-11-04 NOTE — Progress Notes (Addendum)
RT NOTES: Incruse order had not been showing up on RT worklist. Called pharmacy and pharmacy was able to fix order to show up on worklist.

## 2019-11-05 LAB — SARS CORONAVIRUS 2 (TAT 6-24 HRS): SARS Coronavirus 2: NEGATIVE

## 2019-11-05 LAB — SURGICAL PATHOLOGY

## 2019-11-05 MED ORDER — ACETAMINOPHEN 325 MG PO TABS: 650.0000 mg | ORAL_TABLET | Freq: Four times a day (QID) | ORAL | Status: DC | PRN

## 2019-11-05 NOTE — Progress Notes (Signed)
Occupational Therapy Treatment Patient Details Name: [name] Pace MRN: [id number] DOB: 07-07-1950 Today's Date: 11/05/2019    History of present illness This is a 69 year old male with a history of CAD s/p CABG, HTN, EtOH use, Cocaine use, and housing instability who presented with chest pain, symptoms of EtOH withdrawal, hematemesis and hematuria.    OT comments  Pt progressing. ModA for sit to stand; minA scoot to [facility]. Pt stiting EOB x5 mins with head hanging over body at times requiring cues to sit upright. Pt alert at times, but also very lethargic or requiring redirection. Pt would benefit from continued OT skilled services in SNF level therapies. OT following acutely.   Follow Up Recommendations  SNF;Supervision/Assistance - 24 hour    Equipment Recommendations  Other (comment)(defer to next facility)    Recommendations for Other Services      Precautions / Restrictions Precautions Precautions: Fall Restrictions Weight Bearing Restrictions: No       Mobility Bed Mobility Overal bed mobility: Needs Assistance Bed Mobility: Sit to Supine;Sidelying to Sit   Sidelying to sit: Min assist Supine to sit: Mod assist     General bed mobility comments: Pt with cues to use hand rail;   Transfers Overall transfer level: Needs assistance Equipment used: Rolling walker (2 wheeled) Transfers: Sit to/from Stand Sit to Stand: Mod assist         General transfer comment: modA with cues to stand from bed, but able to pull from RW.    Balance Overall balance assessment: Needs assistance Sitting-balance support: Bilateral upper extremity supported;Feet supported Sitting balance-Leahy Scale: Fair Sitting balance - Comments: dynamic sitting balance ~5 mins at EOB                                   ADL either performed or assessed with clinical judgement   ADL Overall ADL's : Needs assistance/impaired     Grooming: Set up;Sitting Grooming Details (indicate  cue type and reason): cues to avoid leaning forward                             Functional mobility during ADLs: Moderate assistance;Rolling walker General ADL Comments: Pt's focus of session at EOB with x1 STS  and lateral scooting to [facility] with modA overall     Vision   Vision Assessment?: No apparent visual deficits   Perception     Praxis      Cognition Arousal/Alertness: Awake/alert Behavior During Therapy: Anxious Overall Cognitive Status: Impaired/Different from baseline Area of Impairment: Awareness;Following commands;Problem solving;Safety/judgement                       Following Commands: Follows multi-step commands inconsistently;Follows one step commands inconsistently Safety/Judgement: Decreased awareness of safety Awareness: Emergent Problem Solving: Slow processing;Decreased initiation;Difficulty sequencing;Requires verbal cues;Requires tactile cues General Comments: pt requires frequent redirection in order to accomplish tasks        Exercises     Shoulder Instructions       General Comments      Pertinent Vitals/ Pain       Pain Assessment: Faces Faces Pain Scale: Hurts little more Pain Location: Pt unable to identify site Pain Descriptors / Indicators: Restless;Discomfort;Cramping Pain Intervention(s): Repositioned;Premedicated before session;Monitored during session  Home Living  Prior Functioning/Environment              Frequency  Min 2X/week        Progress Toward Goals  OT Goals(current goals can now be found in the care plan section)  Progress towards OT goals: Progressing toward goals  Acute Rehab OT Goals Patient Stated Goal: None stated ADL Goals Pt Will Perform Grooming: with set-up;standing Pt Will Perform Upper Body Dressing: with min assist;sitting Pt Will Perform Lower Body Dressing: with mod assist;sitting/lateral leans;sit to/from  stand Pt Will Transfer to Toilet: with mod assist;with +2 assist;stand pivot transfer;bedside commode  Plan Discharge plan remains appropriate    Co-evaluation                 AM-PAC OT "6 Clicks" Daily Activity     Outcome Measure   Help from another person eating meals?: A Little Help from another person taking care of personal grooming?: A Little Help from another person toileting, which includes using toliet, bedpan, or urinal?: Total Help from another person bathing (including washing, rinsing, drying)?: A Lot Help from another person to put on and taking off regular upper body clothing?: A Lot Help from another person to put on and taking off regular lower body clothing?: A Lot 6 Click Score: 13    End of Session Equipment Utilized During Treatment: Gait belt;Rolling walker;Oxygen  OT Visit Diagnosis: Unsteadiness on feet (R26.81);Muscle weakness (generalized) (M62.81);Other symptoms and signs involving cognitive function   Activity Tolerance Patient limited by fatigue;Patient limited by lethargy;Patient limited by pain;Treatment limited secondary to agitation   Patient Left in bed;with call bell/phone within reach;with bed alarm set   Nurse Communication Mobility status        Time: 0830-0900 OT Time Calculation (min): 30 min  Charges: OT General Charges $OT Visit: 1 Visit OT Treatments $Self Care/Home Management : 8-22 mins $Therapeutic Activity: 8-22 mins  Jefferey Pica, OTR/L Acute Rehabilitation Services Pager: [phone] Office: [phone]    Collin Hendley C 11/05/2019, 1:43 PM

## 2019-11-05 NOTE — Progress Notes (Signed)
Physical Therapy Treatment Patient Details Name: [name] Pace MRN: [id number] DOB: 11/07/1950 Today's Date: 11/05/2019    History of Present Illness This is a 69 year old male with a history of CAD s/p CABG, HTN, EtOH use, Cocaine use, and housing instability who presented with chest pain, symptoms of EtOH withdrawal, hematemesis and hematuria.     PT Comments    Pt lying sideways in the bed on entry, agreeable to getting up to chair today. Pt continues to be tangential, perseverating on fact he has no "britches". Pt came to sitting EoB without assist however unable to control descent of L LE to floor and he let out a shout when it hit the ground. Pt with decreased awareness of how weak he has become. Attempted to come to standing in RW but unable to come to fully upright with maxAx2. Pt transfers to chair with modAx2 for squat pivot transfer. D/c plans remain appropriate. PT will continue to follow acutely.    Follow Up Recommendations  SNF     Equipment Recommendations  Other (comment)(TBD next venue)       Precautions / Restrictions Precautions Precautions: Fall Restrictions Weight Bearing Restrictions: No    Mobility  Bed Mobility Overal bed mobility: Needs Assistance Bed Mobility: Sit to Supine;Supine to Sit   Sidelying to sit: Min assist Supine to sit: Min guard     General bed mobility comments: min guard for coming to sit EoB, Pt unable to control descent of L LE to floor due to weakness  Transfers Overall transfer level: Needs assistance Equipment used: 2 person hand held assist Transfers: Sit to/from [employer] Sit to Stand: +2 physical assistance;Max assist   Squat pivot transfers: Mod assist;+2 physical assistance     General transfer comment: maxAx2 to attempt to come to standing in RW unable to fully extend knees and improve forward flexion so sat back down on bed, modAx 2 for transfer to recliner on his L         Balance Overall  balance assessment: Needs assistance Sitting-balance support: Bilateral upper extremity supported;Feet supported Sitting balance-Leahy Scale: Fair Sitting balance - Comments: close guard for sitting EoB, as pt with increased forward flexion                                     Cognition Arousal/Alertness: Awake/alert Behavior During Therapy: Anxious Overall Cognitive Status: Impaired/Different from baseline Area of Impairment: Awareness;Following commands;Problem solving;Safety/judgement                       Following Commands: Follows multi-step commands inconsistently;Follows one step commands inconsistently Safety/Judgement: Decreased awareness of safety Awareness: Emergent Problem Solving: Slow processing;Decreased initiation;Difficulty sequencing;Requires verbal cues;Requires tactile cues General Comments: pt agitated about not having his "britches", unaware of how weak he has become as he was not able to control LE descent to floor          General Comments General comments (skin integrity, edema, etc.): VSS on RA, pt continues to be very tangential       Pertinent Vitals/Pain Pain Assessment: Faces Faces Pain Scale: Hurts little more Pain Location: all over Pain Descriptors / Indicators: Restless;Discomfort;Cramping Pain Intervention(s): Limited activity within patient's tolerance;Monitored during session;Repositioned           PT Goals (current goals can now be found in the care plan section) Acute Rehab PT Goals Patient Stated  Goal: None stated PT Goal Formulation: Patient unable to participate in goal setting Time For Goal Achievement: 11/11/19 Potential to Achieve Goals: Fair Progress towards PT goals: Progressing toward goals    Frequency    Min 2X/week      PT Plan Current plan remains appropriate    Co-evaluation PT/OT/SLP Co-Evaluation/Treatment: Yes            AM-PAC PT "6 Clicks" Mobility   Outcome Measure  Help  needed turning from your back to your side while in a flat bed without using bedrails?: A Lot Help needed moving from lying on your back to sitting on the side of a flat bed without using bedrails?: Total Help needed moving to and from a bed to a chair (including a wheelchair)?: Total Help needed standing up from a chair using your arms (e.g., wheelchair or bedside chair)?: Total Help needed to walk in hospital room?: Total Help needed climbing 3-5 steps with a railing? : Total 6 Click Score: 7    End of Session Equipment Utilized During Treatment: Gait belt Activity Tolerance: Patient tolerated treatment well Patient left: with call bell/phone within reach;in chair;with chair alarm set Nurse Communication: Mobility status PT Visit Diagnosis: Unsteadiness on feet (R26.81);Other abnormalities of gait and mobility (R26.89);Adult, failure to thrive (R62.7)     Time: [id number] PT Time Calculation (min) (ACUTE ONLY): 16 min  Charges:  $Therapeutic Activity: 8-22 mins                     Luciano Cinquemani B. Migdalia Dk PT, DPT Acute Rehabilitation Services Pager [phone] Office [phone]    [place] 11/05/2019, 2:04 PM

## 2019-11-05 NOTE — Progress Notes (Signed)
HD#12 Subjective:  Overnight Events: no events overnight.   [name] Pace states he feels like a "piece of meat" today. He reports this has been going on for some time. He reports continued chest pain. We discussed the EGD findings. He reports a good appetite and that he ate all of his breakfast this AM. He is alert and oriented today. He states he isn't able to get up and go to the bathroom because he has pain in his legs. He reports some improvement in his pain since he came in but that it comes and goes. He has no other complaints or concerns.  Objective:  Vital signs in last 24 hours: Vitals:   11/04/19 1145 11/04/19 1944 11/05/19 0020 11/05/19 0343  BP: (!) 151/76 132/88 (!) 137/93 (!) 163/71  Pulse: (!) 57 65 64 (!) 47  Resp:   20 19  Temp: 98.8 F (37.1 C) 98 F (36.7 C) 97.8 F (36.6 C) 98.6 F (37 C)  TempSrc: Oral Oral Oral Oral  SpO2: 100% 99% 99% 100%  Weight:      Height:       Supplemental O2: RA SpO2: 100 % O2 Flow Rate (L/min): 3 L/min FiO2 (%): 28 %   Physical Exam:  Physical Exam  Constitutional: He is oriented to person, place, and time and well-developed, well-nourished, and in no distress.  HENT:  Head: Normocephalic and atraumatic.  Eyes: EOM are normal.  Neck: No tracheal deviation present.  Cardiovascular: Normal rate and intact distal pulses. Exam reveals no gallop and no friction rub.  No murmur heard. Pulmonary/Chest: Effort normal and breath sounds normal. No respiratory distress.  Abdominal: Soft. Bowel sounds are normal. He exhibits no distension. There is no abdominal tenderness.  Musculoskeletal:        General: No tenderness or edema. Normal range of motion.  Neurological: He is alert and oriented to person, place, and time.  Skin: Skin is warm and dry.    Filed Weights   11/02/19 0611 11/03/19 0410 11/04/19 0519  Weight: 58.3 kg 59.1 kg 60.2 kg     Intake/Output Summary (Last 24 hours) at 11/05/2019 0543 Last data filed at  11/04/2019 1700 Gross per 24 hour  Intake --  Output 250 ml  Net -250 ml   Net IO Since Admission: -2,051.8 mL [11/05/19 0543]  Pertinent Labs: CBC Latest Ref Rng & Units 11/01/2019 10/29/2019 10/27/2019  WBC 4.0 - 10.5 K/uL 6.6 7.3 7.1  Hemoglobin 13.0 - 17.0 g/dL 12.7(L) 15.1 13.7  Hematocrit 39.0 - 52.0 % 37.9(L) 44.4 40.3  Platelets 150 - 400 K/uL 307 185 182    CMP Latest Ref Rng & Units 11/03/2019 11/01/2019 10/29/2019  Glucose 70 - 99 mg/dL 93 114(H) 101(H)  BUN 8 - 23 mg/dL 12 16 13   Creatinine 0.61 - 1.24 mg/dL 0.65 0.68 0.78  Sodium 135 - 145 mmol/L 143 142 139  Potassium 3.5 - 5.1 mmol/L 4.7 3.4(L) 4.0  Chloride 98 - 111 mmol/L 110 109 103  CO2 22 - 32 mmol/L 20(L) 25 24  Calcium 8.9 - 10.3 mg/dL 8.5(L) 8.7(L) 8.9  Total Protein 6.5 - 8.1 g/dL - - -  Total Bilirubin 0.3 - 1.2 mg/dL - - -  Alkaline Phos 38 - 126 U/L - - -  AST 15 - 41 U/L - - -  ALT 0 - 44 U/L - - -    Pending Labs: none  Imaging: No results found.   Assessment/Plan:   Principal Problem:  Atypical chest pain Active Problems:   Aortic atherosclerosis (HCC)   Emphysema of lung (HCC)   Alcohol dependence with withdrawal (HCC)   Protein-calorie malnutrition, severe   BMI less than 19,adult    Patient Summary: [name] Pace is a 69 y.o. who  has a past medical history of Alcohol abuse, Anxiety, Breast cancer ([place]) (Left breast), Cocaine abuse ([place]) (08/19/2018), Coronary artery disease, High cholesterol, Left rib fracture (08/19/2018), Prostate cancer ([place]), and Stroke ([place])., presented with epigastric pain and admit for AMS on hospital day 12  Epigastric abdominal pain:  Esophageal spasm: -We will consider calcium channel blocker versus nitro for esophageal spasms.  Acute Encephalopathy:  - Patient is alert and orients but still having some confusion.  Unsure what the patient's baseline mental status is.  -Patient has had significant provement since he was admitted but continues to remain  confused. -I am concerned the patient may have some Wernicke/Korsakoff syndrome underlying that may contribute to some chronic confusion at baseline. -We will continue supplementation with folate acid, thiamine, B12.  ETOH use disorder: -Patient does not appear to be actively withdrawing from alcohol.  This may have been a contributing factor to his acute toxic encephalopathy.  Physical deconditioning  -PT/OT consulted for SNF placement   Muscluoskelatal pain: -He complains of pain in his knees and shoulder consistent with likely diagnosis of osteoarthritis -He has Tylenol and diclofenac cream prescribed for him. -Given his underlying alteration in mental status combined with his age, patient is at high risk for increased delirium secondary to centrally acting medications.  We will have to proceed with caution when using these analgesic medications.   Diet: soft IVF: None,None VTE: Enoxaparin Code: Full PT/OT recs: SNF for Subacute PT TOC recs: waiting on snf placement.    Dispo: Anticipated discharge pending snf placement.    Marianna Payment, D.O. MCIMTP, PGY-1 Date 11/05/2019 Time 5:43 AM Pager: [phone]

## 2019-11-05 NOTE — TOC Progression Note (Signed)
Transition of Care [facility]) - Progression Note    Patient Details  Name: [name] Pace MRN: [id number] Date of Birth: December 12, 1950  Transition of Care [facility]) CM/SW [place], [place] Phone Number: 11/05/2019, 4:26 PM  Clinical Narrative:     CSW spoke with [name] to let her know that [facility] patient for SNF placement but that insurance authorization is pending. CSW will follow up tomorrow  with [facility] to see if insurance authorization has been approved.  CSW will continue to follow.  Expected Discharge Plan: [place] Barriers to Discharge: Continued Medical Work up  Expected Discharge Plan and Services Expected Discharge Plan: [place] arrangements for the past 2 months: Single Family Home                                       Social Determinants of Health (SDOH) Interventions    Readmission Risk Interventions Readmission Risk Prevention Plan 04/10/2019  Transportation Screening Complete  Medication Review Press photographer) Complete  PCP or Specialist appointment within 3-5 days of discharge Complete  HRI or [place] Complete  SW Recovery Care/Counseling Consult Complete  [place] Patient Refused  Some recent data might be hidden

## 2019-11-06 MED ORDER — ACETAMINOPHEN 325 MG PO TABS
650.0000 mg | ORAL_TABLET | Freq: Two times a day (BID) | ORAL | Status: DC
  Administered 2019-11-06 – 2019-11-07 (×3): 650 mg via ORAL
  Filled 2019-11-06 (×3): qty 2

## 2019-11-06 MED ORDER — ISOSORBIDE MONONITRATE 10 MG PO TABS
5.0000 mg | ORAL_TABLET | Freq: Every day | ORAL | Status: DC
  Administered 2019-11-06: 5 mg via ORAL
  Filled 2019-11-06 (×2): qty 0.5

## 2019-11-06 NOTE — Progress Notes (Addendum)
HD#13 Subjective:  Overnight Events: no overnight events.  [name] Pace is laying comfortably in bed on morning rounds. He reports continued knee pain. He has had this pain for years. He states that exercise has helped in the past.   Objective:  Vital signs in last 24 hours: Vitals:   11/05/19 1752 11/05/19 2015 11/06/19 0616 11/06/19 0618  BP: (!) 156/87 (!) 157/78  (!) 156/86  Pulse: 63 64  68  Resp: 17 16  20   Temp: (!) 97.5 F (36.4 C) 98.1 F (36.7 C)  98.2 F (36.8 C)  TempSrc: Oral Oral  Axillary  SpO2: 98% 92%    Weight:   61.4 kg   Height:       Supplemental O2: Nasal Cannula SpO2: 92 % O2 Flow Rate (L/min): 3 L/min FiO2 (%): 28 %   Physical Exam:  Physical Exam  Constitutional: He is oriented to person, place, and time and well-developed, well-nourished, and in no distress.  HENT:  Head: Normocephalic and atraumatic.  Eyes: EOM are normal.  Neck: No tracheal deviation present.  Cardiovascular: Normal rate and intact distal pulses. Exam reveals no gallop and no friction rub.  No murmur heard. Pulmonary/Chest: Effort normal and breath sounds normal. No respiratory distress.  Abdominal: Soft. Bowel sounds are normal. He exhibits no distension. There is no abdominal tenderness.  Musculoskeletal:        General: Tenderness (knees and shoulder) present. No edema. Normal range of motion.  Neurological: He is alert and oriented to person, place, and time.  Skin: Skin is warm and dry.    Filed Weights   11/04/19 0519 11/05/19 0343 11/06/19 0616  Weight: 60.2 kg 60.1 kg 61.4 kg    No intake or output data in the 24 hours ending 11/06/19 0718 Net IO Since Admission: -2,051.8 mL [11/06/19 0718]  Pertinent Labs: CBC Latest Ref Rng & Units 11/01/2019 10/29/2019 10/27/2019  WBC 4.0 - 10.5 K/uL 6.6 7.3 7.1  Hemoglobin 13.0 - 17.0 g/dL 12.7(L) 15.1 13.7  Hematocrit 39.0 - 52.0 % 37.9(L) 44.4 40.3  Platelets 150 - 400 K/uL 307 185 182    CMP Latest Ref Rng &  Units 11/03/2019 11/01/2019 10/29/2019  Glucose 70 - 99 mg/dL 93 114(H) 101(H)  BUN 8 - 23 mg/dL 12 16 13   Creatinine 0.61 - 1.24 mg/dL 0.65 0.68 0.78  Sodium 135 - 145 mmol/L 143 142 139  Potassium 3.5 - 5.1 mmol/L 4.7 3.4(L) 4.0  Chloride 98 - 111 mmol/L 110 109 103  CO2 22 - 32 mmol/L 20(L) 25 24  Calcium 8.9 - 10.3 mg/dL 8.5(L) 8.7(L) 8.9  Total Protein 6.5 - 8.1 g/dL - - -  Total Bilirubin 0.3 - 1.2 mg/dL - - -  Alkaline Phos 38 - 126 U/L - - -  AST 15 - 41 U/L - - -  ALT 0 - 44 U/L - - -    Pending Labs: none  Imaging: No results found.   Assessment/Plan:   Principal Problem:   Atypical chest pain Active Problems:   Aortic atherosclerosis (HCC)   Emphysema of lung (HCC)   Alcohol dependence with withdrawal ([place])   Protein-calorie malnutrition, severe   BMI less than 19,adult    has a past medical history of Alcohol abuse, Anxiety, Breast cancer ([place]) (Left breast), Cocaine abuse ([place]) (08/19/2018), Coronary artery disease, High cholesterol, Left rib fracture (08/19/2018), Prostate cancer ([place]), and Stroke ([place]).  Patient Summary: [name] Pace is a 69 y.o. with a  pertinent PMH of alcohol and cocaine use disorder, coronary artery disease, hyperlipidemia, breast cancer, prostate cancer, CVA, who presented with epigastric pain and admitted for acute encephalopathy. He/she is on hospital day 13 and doing well.   Epigastric abdominal pain:  Esophageal spasm: -We will likely start either nitrofor esophageal spasms  Acute Encephalopathy:  - Patient is alert and orients but still having some confusion.  Unsure what the patient's baseline mental status is.  - Patient has had significant provement since he was admitted but continues to remain confused. - I am concerned the patient may have some Wernicke/Korsakoff syndrome underlying that may contribute to some chronic confusion at baseline. -We will continue supplementation with folate acid, thiamine, B12.  ETOH use  disorder: -Continue vitamin supplementation.  Physical deconditioning  -PT/OT consulted for SNF placement   Muscluoskelatal pain: -Tylenol, diclofenac cream as needed if he continues to have pain we will add lidocaine patch.  Diet: Normal IVF: None,None VTE: Enoxaparin Code: Full PT/OT recs: SNF for Subacute PT TOC recs: snf   Dispo: Anticipated discharge snf.    Marianna Payment, D.O. MCIMTP, PGY-1 Date 11/06/2019 Time 7:18 AM Pager: [phone]

## 2019-11-06 NOTE — Care Management Important Message (Signed)
Important Message  Patient Details  Name: [name] Pace MRN: [id number] Date of Birth: Apr 01, 1951   Medicare Important Message Given:  Yes     [name] 11/06/2019, 4:02 PM

## 2019-11-06 NOTE — TOC Progression Note (Addendum)
Transition of Care [facility]) - Progression Note    Patient Details  Name: JAKORY SPEDDING MRN: [id number] Date of Birth: [birth year]  Transition of Care [facility]) CM/SW [place], [place] Phone Number: 11/06/2019, 3:58 PM  Clinical Narrative:      Update 6/3: [facility] received insurance authorization for patient. Plan is for patient to discharge today.   CSW spoke with [place] and insurance authorization is still pending for patient for possible SNF placement to [place].  Patient has bed at [facility] Insurance authorization pending.  CSW will continue to follow.  Expected Discharge Plan: [place] Barriers to Discharge: Continued Medical Work up  Expected Discharge Plan and Services Expected Discharge Plan: [place] arrangements for the past 2 months: Single Family Home                                       Social Determinants of Health (SDOH) Interventions    Readmission Risk Interventions Readmission Risk Prevention Plan 04/10/2019  Transportation Screening Complete  Medication Review Press photographer) Complete  PCP or Specialist appointment within 3-5 days of discharge Complete  HRI or [place] Complete  SW Recovery Care/Counseling Consult Complete  [place] Patient Refused  Some recent data might be hidden

## 2019-11-06 NOTE — Progress Notes (Signed)
Nutrition Follow-up  DOCUMENTATION CODES:   Severe malnutrition in context of social or environmental circumstances, Underweight  INTERVENTION:    Continue to offer Ensure Enlive po TID, each supplement provides 350 kcal and 20 grams of protein  Continue MVI with minerals daily  NUTRITION DIAGNOSIS:   Severe Malnutrition related to social / environmental circumstances as evidenced by severe fat depletion, severe muscle depletion, percent weight loss(11% weight loss within 1 month).  Ongoing   GOAL:   Patient will meet greater than or equal to 90% of their needs  Progressing  MONITOR:   PO intake, Supplement acceptance, Labs, Weight trends, Skin, I & O's  REASON FOR ASSESSMENT:   Consult, Malnutrition Screening Tool Assessment of nutrition requirement/status  ASSESSMENT:   69 yo male with CAD s/p CABG (2009), alcohol dependence with history of DTs/withdrawl seizures, history of CVAs, and persistent anxiety. Admitted to [facility] on 10/24/19 for further evaluation and monitoring of chest pain, hematemesis, hematuria and alcohol withdrawal. ACS ruled out diagnostically in ED.  EGD on 5/30 showed significant esophageal spasm, no evidence of esophageal stricture. Biopsies performed on a few gastric and duodenal bulb polyps.  Diet advanced to soft on 5/30. Meal intakes documented at 100% x 2 meals on 5/30, no other documentation of meal intake available. Patient has been drinking the Ensure Enlive supplements 0-1 time daily.  Labs reviewed.   Medications reviewed and include folic acid, MVI with minerals, thiamine, vitamin B-12.  Diet Order:   Diet Order            DIET SOFT Room service appropriate? Yes; Fluid consistency: Thin  Diet effective now              EDUCATION NEEDS:   No education needs have been identified at this time  Skin:  Skin Assessment: Skin Integrity Issues: Skin Integrity Issues:: Other (Comment) Other: MASD bilateral buttocks  Last BM:   6/1  Height:   Ht Readings from Last 1 Encounters:  10/24/19 6\' 2"  (1.88 m)    Weight:   Wt Readings from Last 1 Encounters:  11/06/19 61.4 kg    Ideal Body Weight:  86.4 kg  BMI:  Body mass index is 17.38 kg/m.  Estimated Nutritional Needs:   Kcal:  2100-2300  Protein:  115-130 grams  Fluid:  > 2.1 L    Lucas Mallow, RD, LDN, CNSC Please refer to Amion for contact information.

## 2019-11-07 ENCOUNTER — Encounter: Payer: Self-pay | Admitting: Internal Medicine

## 2019-11-07 ENCOUNTER — Non-Acute Institutional Stay (SKILLED_NURSING_FACILITY): Payer: Medicare Other | Admitting: Internal Medicine

## 2019-11-07 DIAGNOSIS — I7300 Raynaud's syndrome without gangrene: Secondary | ICD-10-CM | POA: Insufficient documentation

## 2019-11-07 DIAGNOSIS — R627 Adult failure to thrive: Secondary | ICD-10-CM | POA: Diagnosis not present

## 2019-11-07 DIAGNOSIS — F1020 Alcohol dependence, uncomplicated: Secondary | ICD-10-CM | POA: Diagnosis not present

## 2019-11-07 MED ORDER — ISOSORBIDE MONONITRATE 10 MG PO TABS: 5.0000 mg | ORAL_TABLET | Freq: Every day | ORAL | 0 refills | Status: DC

## 2019-11-07 MED ORDER — ACETAMINOPHEN 325 MG PO TABS: 650.0000 mg | ORAL_TABLET | Freq: Four times a day (QID) | ORAL | 0 refills | Status: AC | PRN

## 2019-11-07 NOTE — Patient Instructions (Signed)
See assessment and plan under each diagnosis in the problem list and acutely for this visit 

## 2019-11-07 NOTE — TOC Transition Note (Signed)
Transition of Care [facility]) - CM/SW Discharge Note   Patient Details  Name: [name] Pace MRN: [id number] Date of Birth: December 08, 1950  Transition of Care [facility]) CM/SW Contact:  Trula Ore, [place] Phone Number: 11/07/2019, 11:04 AM   Clinical Narrative:     Patient will DC to: [facility]  Anticipated DC date: 11/07/2019  Family notified: Aeronautical engineer by: Corey Harold  ?  Per MD patient ready for DC to [facility] . RN, patient, patient's family, and facility notified of DC. Discharge Summary sent to facility. RN given number for report [phone] RM#[phone]. DC packet on chart. Ambulance transport requested for patient.  CSW signing off.   Final next level of care: [place] Barriers to Discharge: No Barriers Identified   Patient Goals and CMS Choice Patient states their goals for this hospitalization and ongoing recovery are:: to go to skilled nursing facility CMS Medicare.gov Compare Post Acute Care list provided to:: Patient Represenative (must comment)(Katie) Choice offered to / list presented to : Adult Children(Katie)  Discharge Placement              Patient chooses bed at: [facility] Patient to be transferred to facility by: [place] Name of family member notified: Katie Patient and family notified of of transfer: 11/07/19  Discharge Plan and Services                                     Social Determinants of Health (SDOH) Interventions     Readmission Risk Interventions Readmission Risk Prevention Plan 04/10/2019  Transportation Screening Complete  Medication Review Press photographer) Complete  PCP or Specialist appointment within 3-5 days of discharge Complete  HRI or [place] Complete  SW Recovery Care/Counseling Consult Complete  [place] Patient Refused  Some recent data might be hidden

## 2019-11-07 NOTE — Progress Notes (Signed)
Report given to with Steele Sizer at [place]. Patient discharged and transferred via ambulance, [place].

## 2019-11-07 NOTE — Assessment & Plan Note (Signed)
Continue folate and B12 supplementation.  Nutrition consult.

## 2019-11-07 NOTE — Progress Notes (Signed)
Event: Paged that patient found sitting on floor next to bed and reported fall  Assessment: Patient seen and examined at bedside.  Patient reports that he hit the back of his head on the tray table when falling, denies other injuries.  On examination, there is no tenderness, deformity, or hematoma on scalp.  Neurologic exam was without deficit.  Patient is able to articulate understanding related to need to remain in bed.  Padding is present on floor around bed.  Jeanmarie Hubert, MD 11/07/2019, 2:11 AM

## 2019-11-07 NOTE — Assessment & Plan Note (Signed)
Unfortunately he has no interest in discontinuing his tobacco abuse

## 2019-11-07 NOTE — Progress Notes (Signed)
   11/07/19 0142  What Happened  Was fall witnessed? No  Was patient injured? No  Patient found on floor  Found by Staff-comment  Stated prior activity ambulating-unassisted  Follow Up  MD notified Jeanmarie Hubert, MD  Time MD notified [phone]  Family notified Yes - comment (daughter [name])  Time family notified 0200  Additional tests No  Progress note created (see row info) Yes  Adult Fall Risk Assessment  Risk Factor Category (scoring not indicated) High fall risk per protocol (document High fall risk)  Patient Fall Risk Level High fall risk  Adult Fall Risk Interventions  Required Bundle Interventions *See Row Information* High fall risk - low, moderate, and high requirements implemented  Additional Interventions Use of appropriate toileting equipment (bedpan, BSC, etc.)  Screening for Fall Injury Risk (To be completed on HIGH fall risk patients) - Assessing Need for Low Bed  Risk For Fall Injury- Low Bed Criteria None identified - Continue screening  Screening for Fall Injury Risk (To be completed on HIGH fall risk patients who do not meet crieteria for Low Bed) - Assessing Need for Floor Mats Only  Risk For Fall Injury- Criteria for Floor Mats Confusion/dementia (+NuDESC, CIWA, TBI, etc.)  Will Implement Floor Mats Yes  Vitals  Temp 97.6 F (36.4 C)  Temp Source Oral  BP (!) 153/81  MAP (mmHg) 104  BP Location Right Arm  BP Method Automatic  Patient Position (if appropriate) Lying  Pulse Rate 64  Pulse Rate Source Monitor  Resp 19  Oxygen Therapy  SpO2 95 %  O2 Device Room Air  Pain Assessment  Pain Scale 0-10  Pain Location Knee  Pain Orientation Left  Neurological  Neuro (WDL) X  Level of Consciousness Alert  Orientation Level Oriented to person;Oriented to time;Disoriented to place;Disoriented to situation  Cognition Impulsive;Poor Estate agent Slurred/Dysarthria;Delayed responses  Pupil Assessment  No  Glasgow Coma Scale  Eye  Opening 4  Best Verbal Response (NON-intubated) 4  Best Motor Response 6  Glasgow Coma Scale Score 14  Musculoskeletal  Musculoskeletal (WDL) X  Assistive Device BSC;Front wheel walker  Generalized Weakness Yes  Weight Bearing Restrictions No  Integumentary  Integumentary (WDL) X  Skin Color Appropriate for ethnicity  Skin Condition Dry  Skin Integrity Abrasion  Abrasion Location Buttocks  Abrasion Location Orientation Left  Abrasion Intervention Foam  Moisture Associated Skin Damage Location Buttocks  Moisture Associated Skin Damage Orientation Bilateral  Moisture Associated Skin Damage Intervention Cleansed  Skin Turgor Non-tenting   While rounding on patients, this patient was found on the floor mats by the bed. Patient was assisted back into bed with side rails up and bed alarm set. VSS. States he hit his head on the bedside table. No complaints of new pain. MD and family made aware. Bed alarm was on at time of fall but no lights were active underneath patient's bed.   Elesa Hacker, RN

## 2019-11-07 NOTE — Progress Notes (Signed)
HD#14 Subjective:  Overnight Events: no overnight events.  [name] Pace is sitting comfortably in his chair on rounds this AM. He reports tenderness of his head after a fall overnight. We discussed the importance of not getting up without help in the future so he doesn't have anymore falls. He was understanding of that.   Objective:  Vital signs in last 24 hours: Vitals:   11/06/19 0750 11/06/19 2202 11/07/19 0142 11/07/19 0549  BP:  (!) 176/89 (!) 153/81 139/90  Pulse:  66 64 64  Resp:  20 19 18   Temp:  98.3 F (36.8 C) 97.6 F (36.4 C) 98.6 F (37 C)  TempSrc:   Oral Oral  SpO2: 93% 95% 95% 100%  Weight:    62 kg  Height:       Supplemental O2: Nasal Cannula SpO2: 100 % O2 Flow Rate (L/min): 3 L/min FiO2 (%): 28 %   Physical Exam:  Physical Exam  Constitutional: He is oriented to person, place, and time and well-developed, well-nourished, and in no distress.  HENT:  Head: Normocephalic and atraumatic.  Eyes: EOM are normal.  Neck: No tracheal deviation present.  Cardiovascular: Normal rate and intact distal pulses. Exam reveals no gallop and no friction rub.  No murmur heard. Pulmonary/Chest: Effort normal and breath sounds normal. No respiratory distress.  Abdominal: Soft. Bowel sounds are normal. He exhibits no distension. There is no abdominal tenderness.  Musculoskeletal:        General: Tenderness (knees and shoulder) present. No edema. Normal range of motion.  Neurological: He is alert and oriented to person, place, and time.  Skin: Skin is warm and dry.    Filed Weights   11/05/19 0343 11/06/19 0616 11/07/19 0549  Weight: 60.1 kg 61.4 kg 62 kg    No intake or output data in the 24 hours ending 11/07/19 0855 Net IO Since Admission: -2,051.8 mL [11/07/19 0855]  Pertinent Labs: CBC Latest Ref Rng & Units 11/01/2019 10/29/2019 10/27/2019  WBC 4.0 - 10.5 K/uL 6.6 7.3 7.1  Hemoglobin 13.0 - 17.0 g/dL 12.7(L) 15.1 13.7  Hematocrit 39.0 - 52.0 % 37.9(L)  44.4 40.3  Platelets 150 - 400 K/uL 307 185 182    CMP Latest Ref Rng & Units 11/03/2019 11/01/2019 10/29/2019  Glucose 70 - 99 mg/dL 93 114(H) 101(H)  BUN 8 - 23 mg/dL 12 16 13   Creatinine 0.61 - 1.24 mg/dL 0.65 0.68 0.78  Sodium 135 - 145 mmol/L 143 142 139  Potassium 3.5 - 5.1 mmol/L 4.7 3.4(L) 4.0  Chloride 98 - 111 mmol/L 110 109 103  CO2 22 - 32 mmol/L 20(L) 25 24  Calcium 8.9 - 10.3 mg/dL 8.5(L) 8.7(L) 8.9  Total Protein 6.5 - 8.1 g/dL - - -  Total Bilirubin 0.3 - 1.2 mg/dL - - -  Alkaline Phos 38 - 126 U/L - - -  AST 15 - 41 U/L - - -  ALT 0 - 44 U/L - - -    Pending Labs: none  Imaging: No results found.   Assessment/Plan:   Principal Problem:   Atypical chest pain Active Problems:   Aortic atherosclerosis (HCC)   Emphysema of lung (HCC)   Alcohol dependence with withdrawal ([place])   Protein-calorie malnutrition, severe   BMI less than 19,adult    has a past medical history of Alcohol abuse, Anxiety, Breast cancer ([place]) (Left breast), Cocaine abuse ([place]) (08/19/2018), Coronary artery disease, High cholesterol, Left rib fracture (08/19/2018), Prostate cancer ([place]), and Stroke (  [facility]).  Patient Summary: [name] Pace is a 69 y.o. with a pertinent PMH of alcohol and cocaine use disorder, coronary artery disease, hyperlipidemia, breast cancer, prostate cancer, CVA, who presented with epigastric pain and admitted for acute encephalopathy. He/she is on hospital day 14 and doing well.   Epigastric abdominal pain:  Esophageal spasm: -We will likely start either isosorbide esophageal spasms  Acute Encephalopathy:  - Patient is alert and orients but still having some confusion.  Unsure what the patient's baseline mental status is.  - Patient has had significant provement since he was admitted but continues to remain confused. - I am concerned the patient may have some Wernicke/Korsakoff syndrome underlying that may contribute to some chronic confusion at baseline. -We  will continue supplementation with folate acid, thiamine, B12.  ETOH use disorder: -Continue vitamin supplementation.  Physical deconditioning  -PT/OT consulted for SNF placement   Muscluoskelatal pain: -Tylenol, diclofenac cream as needed if he continues to have pain we will add lidocaine patch.  Diet: Normal IVF: None,None VTE: Enoxaparin Code: Full PT/OT recs: SNF for Subacute PT TOC recs: snf   Dispo: Anticipated discharge snf.    Marianna Payment, D.O. [facility], PGY-1 Date 11/07/2019 Time 8:55 AM Pager: [phone]

## 2019-11-07 NOTE — Progress Notes (Signed)
NURSING HOME LOCATION:  [facility] ROOM NUMBER: [id number]  CODE STATUS: Full code  PCP: No PCP listed.  He states he does not use the [place] hospital.   This is a comprehensive admission note to [facility] performed on this date less than 30 days from date of admission. Included are preadmission medical/surgical history; reconciled medication list; family history; social history and comprehensive review of systems.  Corrections and additions to the records were documented. Comprehensive physical exam was also performed. Additionally a clinical summary was entered for each active diagnosis pertinent to this admission in the Problem List to enhance continuity of care.  HPI: Patient was hospitalized 5/20-11/07/2019 for alcohol withdrawal . His alcohol abuse syndrome was complicated by recurrent falls and malnutrition.  Alcohol dependence disorder was in the context of anxiety/depression and PTSD.  He was drinking a pint of hard alcohol daily to cope.  At admission he was having fairly significant symptoms.  By history he was having seizures and DTs.  He was started on CIWA with Ativan and Librium which was subsequently tapered and discontinued.  Thiamine and B12 replacement were initiated. He did also present with chest pain; ACS was ruled out.  CTA was negative for aortic dissection.  Pain was attributed to chronic pain from prior rib fractures documented on imaging.  EGD did reveal esophageal spasms.  He was started on isosorbide 5 mg daily. Subjectively he had hematemesis and hematuria.  Hemoglobin remained stable and there was no recurrence documented.Patient had bacteriuria with struvite crystals consistent with likely Proteus infection.  He received 5 days of IV ceftriaxone. Vascular was consulted for chronically occluded left subclavian stent.  Continued observation was recommended as the presence of the stent would necessitate carotid-subclavian bypass surgery. He was discharged to the  SNF for PT/OT.  Past medical and surgical history: Includes history of CAD, essential hypertension history of cocaine use, history of breast cancer, dyslipidemia, history of stroke, history of prostate cancer, and housing instability. He has had I&D of the right lower extremity and tendon repair.  Additionally has had intramedullary nailing of the left femur.  Social history: He is a [place] vet.  Family history: Reviewed   Review of systems: He is oriented to date.  His main complaint is pain in the left knee apparently related to an injury.  He plans orthopedic follow-up. He describes intermittent chest pain as well as recent constipation.  Constitutional: No fever Eyes: No redness, discharge, pain, vision change ENT/mouth: No nasal congestion, purulent discharge, earache, change in hearing, sore throat  Cardiovascular: No  palpitations, paroxysmal nocturnal dyspnea, claudication, edema  Respiratory: No hemoptysis, DOE, significant snoring, apnea  Gastrointestinal: No heartburn, dysphagia, abdominal pain, nausea /vomiting, rectal bleeding, melena Genitourinary: No dysuria, hematuria, pyuria, incontinence, nocturia Dermatologic: No rash, pruritus, change in appearance of skin Neurologic: No dizziness, headache, syncope, seizures Endocrine: No change in hair/skin/nails, excessive thirst, excessive hunger, excessive urination  Hematologic/lymphatic: No significant bruising, lymphadenopathy, abnormal bleeding Allergy/immunology: No itchy/watery eyes, significant sneezing, urticaria, angioedema  Physical exam:  Pertinent or positive findings: When I entered the room he was on the floor sprawled in a pool of urine from overturned urinal . He had fallen when he got out of bed to retrieve the phone receiver which  had dropped over the side.  It required 2 people to live team and getting onto the chair back to the bed. His affect was inappropriate as he seemed to find the fall a humorous  event. He appears chronically  ill with gross cachexia and wasting of the limbs.  Hair , beard and mustache are disheveled.  He is edentulous.  Breath sounds are markedly decreased.  Abdomen is protuberant.  He has trace-1/2+ pitting edema.  Pedal pulses are decreased.  There are profound deformities of the toenails, especially great toenails.  On the right that nail is arched laterally and curved back towards the foot.  He has verrucoid lesions over the dorsum of the right hand.  His hands appear light grape colored but do blanch with pressure.  Radial radial pulses are palpable but decreased.  General appearance: no acute distress, increased work of breathing is present despite fall   Lymphatic: No lymphadenopathy about the head, neck, axilla. Eyes: No conjunctival inflammation or lid edema is present. There is no scleral icterus. Ears:  External ear exam shows no significant lesions or deformities.   Nose:  External nasal examination shows no deformity or inflammation. Nasal mucosa are pink and moist without lesions, exudates Oral exam: There is no oropharyngeal erythema or exudate. Neck:  No thyromegaly, masses, tenderness noted.    Heart:  No gallop, murmur, click, rub.  Lungs:  without wheezes, rhonchi, rales, rubs. Abdomen: Bowel sounds are normal.  Abdomen is soft and nontender with no organomegaly, hernias, masses. GU: Deferred  Neurologic exam: Balance, Rhomberg, finger to nose testing could not be completed due to clinical state Skin: Warm & dry w/o tenting. No significant rash.  See clinical summary under each active problem in the Problem List with associated updated therapeutic plan

## 2019-11-08 NOTE — Assessment & Plan Note (Signed)
Psych NP consult as needed

## 2019-11-09 ENCOUNTER — Other Ambulatory Visit: Payer: Self-pay | Admitting: Internal Medicine

## 2019-11-15 ENCOUNTER — Non-Acute Institutional Stay (SKILLED_NURSING_FACILITY): Payer: Medicare Other | Admitting: Adult Health

## 2019-11-15 DIAGNOSIS — J189 Pneumonia, unspecified organism: Secondary | ICD-10-CM | POA: Diagnosis not present

## 2019-11-15 NOTE — Progress Notes (Addendum)
Location:  [place] Room Number: 318/A Place of Service:  SNF (31) Provider:  Durenda Age, DNP, FNP-BC  Patient Care Team: Patient, No Pcp Per as PCP - General (General Practice) [name] Margarita, MD as PCP - Cardiology (Cardiology)  Extended Emergency Contact Information Primary Emergency Contact: [name], Pace Mobile Phone: [phone] Relation: Brother Secondary Emergency Contact: [name], Pace Mobile Phone: [phone] Relation: Daughter  Code Status:  Full Code  Goals of care: Advanced Directive information Advanced Directives 11/15/2019  Does Patient Have a Medical Advance Directive? Yes  Type of Advance Directive (No Data)  Does patient want to make changes to medical advance directive? No - Patient declined  Copy of [place] in Chart? -  Would patient like information on creating a medical advance directive? -     Chief Complaint  Patient presents with  . Acute Visit    Tenderness on chest and back area    HPI:  Pt is a 69 y.o. male seen for tenderness on chest and back area. He is a short-term care resident of [facility]. He has a PMH of  abuse,Breast cancer, prostate cancer and CVA. Resident stated that he fell yesterday and "fell forward". No bruising nor scatches noted. He is seen wandering on the hallway while on his wheelchair. Chest x-ray was done. Resul showed lobular parenchymal opacity at the right lung base may be related to pleural thickening from old healed right sided rib fractures. Perihilar Insterstitial  Prominence bilaterally suggestive of an infectious/inflammatory process.  No reported fever.  He was reported to have productive cough.  He was admitted to [place] on 11/07/19 pos [facility]  hospitalization 10/24/2019 to 11/07/19 with EtOH withdrawal.  He was started on CIWA protocol with Ativan and Librium which were eventually tapered down and  discontinued.  He complained of chest pain.  CTA chest was negative.  Pain was thought to be from prior rib fractures which were noted on imaging.  He was started on isosorbide 5 mg daily and Voltaren gel.  He was treated for UTI with 5 days of IV ceftriaxone while in the hospital.   Past Medical History:  Diagnosis Date  . Alcohol abuse   . Anxiety   . Breast cancer ([place]) Left breast   Per patient diagnosed in early 37's.   . Cocaine abuse ([place]) 08/19/2018  . Coronary artery disease   . High cholesterol   . Left rib fracture 08/19/2018  . Prostate cancer ([place])    per patient diagnosed in early 2's  . Stroke [facility])    Past Surgical History:  Procedure Laterality Date  . BIOPSY  11/03/2019   Procedure: BIOPSY;  Surgeon: Otis Brace, MD;  Location: [place];  Service: Gastroenterology;;  . CAROTID STENT    . CORONARY ARTERY BYPASS GRAFT    . ESOPHAGOGASTRODUODENOSCOPY (EGD) WITH PROPOFOL N/A 11/03/2019   Procedure: ESOPHAGOGASTRODUODENOSCOPY (EGD) WITH PROPOFOL with possible dilation;  Surgeon: Otis Brace, MD;  Location: [place];  Service: Gastroenterology;  Laterality: N/A;  . FEMUR IM NAIL Left 07/02/2014   Procedure: INTRAMEDULLARY (IM) NAIL FEMORAL;  Surgeon: Elie Goody, MD;  Location: [facility];  Service: Orthopedics;  Laterality: Left;  . I & D EXTREMITY Right 02/10/2016   Procedure: IRRIGATION AND DEBRIDEMENT EXTREMITY;  Surgeon: Dorna Leitz, MD;  Location: [place];  Service: Orthopedics;  Laterality: Right;  . TENDON REPAIR Right 02/10/2016   Procedure: Anterior TENDON REPAIR and wound exploration.;  Surgeon: Dorna Leitz, MD;  Location: [place];  Service: Orthopedics;  Laterality: Right;    Allergies  Allergen Reactions  . Penicillins Anaphylaxis    Did it involve swelling of the face/tongue/throat, SOB, or low BP? Yes Did it involve sudden or severe rash/hives, skin peeling, or any reaction on the inside of your mouth or nose? No Did you need to seek  medical attention at a hospital or doctor's office? No When did it last happen?childhood If all above answers are "NO", may proceed with cephalosporin use.     Outpatient Encounter Medications as of 11/15/2019  Medication Sig  . acetaminophen (TYLENOL) 325 MG tablet Take 2 tablets (650 mg total) by mouth every 6 (six) hours as needed for up to 30 doses for mild pain, moderate pain or headache.  . Amino Acids-Protein Hydrolys (FEEDING SUPPLEMENT, PRO-STAT SUGAR FREE 64,) LIQD Take 30 mLs by mouth 2 (two) times daily.  . isosorbide mononitrate (ISMO) 10 MG tablet Take 0.5 tablets (5 mg total) by mouth daily.  . NON FORMULARY Med Pass 117ml bid to promote weight gain  . tiotropium (SPIRIVA) 18 MCG inhalation capsule Place 18 mcg into inhaler and inhale daily.  . [DISCONTINUED] albuterol (VENTOLIN HFA) 108 (90 Base) MCG/ACT inhaler Inhale 2 puffs into the lungs every 6 (six) hours as needed for wheezing or shortness of breath. Use 2 puffs 3 times daily x5 days, then every 6 hours as needed. (Patient not taking: Reported on 10/14/2019)  . [DISCONTINUED] atorvastatin (LIPITOR) 40 MG tablet Take 1 tablet (40 mg total) by mouth daily at 6 PM. (Patient not taking: Reported on 10/14/2019)  . [DISCONTINUED] lisinopril (ZESTRIL) 40 MG tablet Take 1 tablet (40 mg total) by mouth daily. (Patient not taking: Reported on 10/14/2019)  . [DISCONTINUED] metoprolol tartrate (LOPRESSOR) 25 MG tablet Take 1 tablet (25 mg total) by mouth 2 (two) times daily. (Patient not taking: Reported on 10/14/2019)  . [DISCONTINUED] mometasone-formoterol (DULERA) 100-5 MCG/ACT AERO Inhale 2 puffs into the lungs 2 (two) times daily. (Patient not taking: Reported on 10/14/2019)  . [DISCONTINUED] Multiple Vitamin (MULTIVITAMIN WITH MINERALS) TABS tablet Take 1 tablet by mouth daily.   No facility-administered encounter medications on file as of 11/15/2019.    Review of Systems  GENERAL: No change in appetite, no fatigue, no weight  changes, no fever, chills or weakness MOUTH and THROAT: Denies oral discomfort, gingival pain or bleeding RESPIRATORY: no cough, SOB, DOE, wheezing, hemoptysis CARDIAC: +chest pain, no edema or palpitations GI: No abdominal pain, diarrhea, constipation, heart burn, nausea or vomiting GU: Denies dysuria, frequency, hematuria, incontinence, or discharge NEUROLOGICAL: Denies dizziness, syncope, numbness, or headache PSYCHIATRIC: Denies feelings of depression or anxiety. No report of hallucinations, insomnia, paranoia, or agitation   Immunization History  Administered Date(s) Administered  . Influenza, High Dose Seasonal PF 05/15/2018  . Pneumococcal Polysaccharide-23 06/16/2014, 05/15/2018  . Tdap 02/10/2016   Pertinent  Health Maintenance Due  Topic Date Due  . COLONOSCOPY  Never done  . PNA vac Low Risk Adult (2 of 2 - PCV13) 05/16/2019  . INFLUENZA VACCINE  01/05/2020   No flowsheet data found.   Vitals:   11/15/19 1600  BP: 127/79  Pulse: 70  Resp: 20  Temp: (!) 97.5 F (36.4 C)  Weight: 140 lb 3.2 oz (63.6 kg)  Height: 6\' 3"  (1.905 m)   Body mass index is 17.52 kg/m.  Physical Exam  GENERAL APPEARANCE:   In no acute distress.  SKIN:  Skin is warm and  dry.  MOUTH and THROAT: Lips are without lesions. Oral mucosa is moist and without lesions. Tongue is normal in shape, size, and color and without lesions RESPIRATORY: Breathing is even & unlabored, BS CTAB CARDIAC: RRR, no murmur,no extra heart sounds, no edema GI: Abdomen soft, normal BS, no masses, no tenderness EXTREMITIES:  Able to move X 4 extremities. NEUROLOGICAL: There is no tremor. Speech is clear.  PSYCHIATRIC:  Affect and behavior are appropriate  Labs reviewed: Recent Labs    10/27/19 0304 10/27/19 0304 10/28/19 0412 10/28/19 0412 10/29/19 0434 11/01/19 0329 11/03/19 0747  NA 137   < > 135   < > 139 142 143  K 4.8   < > 3.7   < > 4.0 3.4* 4.7  CL 103   < > 101   < > 103 109 110  CO2 24   < >  19*   < > 24 25 20*  GLUCOSE 101*   < > 77   < > 101* 114* 93  BUN 13   < > 9   < > 13 16 12   CREATININE 0.60*   < > 0.64   < > 0.78 0.68 0.65  CALCIUM 8.8*   < > 8.7*   < > 8.9 8.7* 8.5*  MG 1.8  --  1.5*  --  2.1  --   --   PHOS 1.9*   < > 4.3  --  5.0* 3.3  --    < > = values in this interval not displayed.   Recent Labs    10/14/19 0757 10/14/19 0757 10/24/19 1536 10/25/19 0334 11/01/19 0329  AST 37  --  67* 36  --   ALT 31  --  36 28  --   ALKPHOS 91  --  103 87  --   BILITOT 0.9  --  0.9 1.1  --   PROT 6.2*  --  6.1* 5.3*  --   ALBUMIN 3.0*   < > 3.1* 2.7* 2.4*   < > = values in this interval not displayed.   Recent Labs    04/12/19 0300 04/12/19 0300 04/13/19 0334 04/14/19 1143 10/27/19 0304 10/29/19 0718 11/01/19 0329  WBC 6.7   < > 8.4   < > 7.1 7.3 6.6  NEUTROABS 4.4  --  5.1  --   --   --  4.3  HGB 12.0*   < > 12.4*   < > 13.7 15.1 12.7*  HCT 35.5*   < > 37.2*   < > 40.3 44.4 37.9*  MCV 107.3*   < > 108.5*   < > 109.8* 107.0* 108.0*  PLT 249   < > 261   < > 182 185 307   < > = values in this interval not displayed.   Lab Results  Component Value Date   TSH 0.343 (L) 12/01/2018   Lab Results  Component Value Date   HGBA1C 4.5 (L) 12/01/2018   Lab Results  Component Value Date   CHOL 218 (H) 04/06/2019   HDL 109 04/06/2019   LDLCALC 89 04/06/2019   TRIG 100 04/06/2019   CHOLHDL 2.0 04/06/2019    Significant Diagnostic Results in last 30 days:  DG Swallowing Func-Speech Pathology  Result Date: 11/01/2019 Objective Swallowing Evaluation: Type of Study: MBS-Modified Barium Swallow Study  Patient Details Name: [name] Pace MRN: [id number] Date of Birth: 05/18/1951 Today's Date: 11/01/2019 Time: SLP Start Time (ACUTE ONLY): 2440 -SLP Stop Time (ACUTE ONLY):  1052 SLP Time Calculation (min) (ACUTE ONLY): 25 min Past Medical History: Past Medical History: Diagnosis Date . Alcohol abuse  . Anxiety  . Breast cancer ([place]) Left breast  Per patient diagnosed in  early 51's.  . Cocaine abuse ([place]) 08/19/2018 . Coronary artery disease  . High cholesterol  . Left rib fracture 08/19/2018 . Prostate cancer ([place])   per patient diagnosed in early 30's . Stroke [facility])  Past Surgical History: Past Surgical History: Procedure Laterality Date . CAROTID STENT   . CORONARY ARTERY BYPASS GRAFT   . FEMUR IM NAIL Left 07/02/2014  Procedure: INTRAMEDULLARY (IM) NAIL FEMORAL;  Surgeon: Elie Goody, MD;  [facility];  Service: Orthopedics;  Laterality: Left; . I & D EXTREMITY Right 02/10/2016  Procedure: IRRIGATION AND DEBRIDEMENT EXTREMITY;  Surgeon: Dorna Leitz, MD;  Location: [place];  Service: Orthopedics;  Laterality: Right; . TENDON REPAIR Right 02/10/2016  Procedure: Anterior TENDON REPAIR and wound exploration.;  Surgeon: Dorna Leitz, MD;  Location: [place];  Service: Orthopedics;  Laterality: Right; HPI: 69 yo male with CAD s/p CABG (2009), alcohol dependence with history of DTs/withdrawal, seizures, history of CVAs, and persistent anxiety. Admitted to [facility] on 10/24/19 for further evaluation and monitoring of chest pain, hematemesis, hematuria and alcohol withdrawal. Swallow eval ordered due to observed coughing when eating.  Pt well known to SLP service - tends to develop acute dysphagia related to mental stauts changes.  Subjective: sleepy Assessment / Plan / Recommendation CHL IP CLINICAL IMPRESSIONS 11/01/2019 Clinical Impression Pt presents with oropharyngeal dyspahgia characterized by prolonged mastication, reduced bolus cohesion, and reduced anterior laryngeal movement. He demonstrated premature spillage of liquids to the valleculae, occasional incomplete epiglottic inversion, and inconsistent penetratio (PAS 3) of nectar thick liquids with ultimate aspiration (PAS 7) secondary to incomplete epiglottic inversion. Esophageal screening revealed slow movement of the 22mm barium tablet through the thoracic esophagus with retention of barium, backflow to the cervical esophagus and  pharynx, and aspiration (PAS 7) of backflowed material. Pt exhibited more difficulty with backflow when there was a solid bolus and subsequent aspiration retained in the esophagus. It is therefore recommended that the pt's diet be modified to clear liquids and that an esophageal assessment and/or GI consult be completed. RN an MD have been advised of results and these recommendations.  SLP Visit Diagnosis Dysphagia, oropharyngeal phase (R13.12);Dysphagia, pharyngoesophageal phase (R13.14) Attention and concentration deficit following -- Frontal lobe and executive function deficit following -- Impact on safety and function Moderate aspiration risk   CHL IP TREATMENT RECOMMENDATION 11/01/2019 Treatment Recommendations Therapy as outlined in treatment plan below   Prognosis 11/01/2019 Prognosis for Safe Diet Advancement Good Barriers to Reach Goals Cognitive deficits Barriers/Prognosis Comment -- CHL IP DIET RECOMMENDATION 11/01/2019 SLP Diet Recommendations Thin liquid Liquid Administration via Cup;Straw Medication Administration Whole meds with liquid Compensations Minimize environmental distractions;Slow rate;Small sips/bites Postural Changes Remain semi-upright after after feeds/meals (Comment);Seated upright at 90 degrees   CHL IP OTHER RECOMMENDATIONS 11/01/2019 Recommended Consults Consider GI evaluation;Consider esophageal assessment Oral Care Recommendations Oral care BID Other Recommendations --   CHL IP FOLLOW UP RECOMMENDATIONS 11/01/2019 Follow up Recommendations None   CHL IP FREQUENCY AND DURATION 11/01/2019 Speech Therapy Frequency (ACUTE ONLY) min 2x/week Treatment Duration 2 weeks      CHL IP ORAL PHASE 11/01/2019 Oral Phase Impaired Oral - Pudding Teaspoon -- Oral - Pudding Cup -- Oral - Honey Teaspoon -- Oral - Honey Cup -- Oral - Nectar Teaspoon -- Oral - Nectar Cup Decreased bolus  cohesion;Delayed oral transit Oral - Nectar Straw Decreased bolus cohesion Oral - Thin Teaspoon -- Oral - Thin Cup Decreased  bolus cohesion Oral - Thin Straw Decreased bolus cohesion Oral - Puree -- Oral - Mech Soft -- Oral - Regular Impaired mastication Oral - Multi-Consistency -- Oral - Pill -- Oral Phase - Comment --  CHL IP PHARYNGEAL PHASE 11/01/2019 Pharyngeal Phase Impaired Pharyngeal- Pudding Teaspoon -- Pharyngeal -- Pharyngeal- Pudding Cup -- Pharyngeal -- Pharyngeal- Honey Teaspoon -- Pharyngeal -- Pharyngeal- Honey Cup -- Pharyngeal -- Pharyngeal- Nectar Teaspoon -- Pharyngeal -- Pharyngeal- Nectar Cup -- Pharyngeal -- Pharyngeal- Nectar Straw Reduced epiglottic inversion;Pharyngeal residue - pyriform;Penetration/Aspiration during swallow Pharyngeal Material enters airway, remains ABOVE vocal cords and not ejected out;Material enters airway, passes BELOW cords and not ejected out despite cough attempt by patient Pharyngeal- Thin Teaspoon -- Pharyngeal -- Pharyngeal- Thin Cup Penetration/Apiration after swallow;Trace aspiration;Pharyngeal residue - pyriform;Reduced anterior laryngeal mobility Pharyngeal Material enters airway, passes BELOW cords and not ejected out despite cough attempt by patient Pharyngeal- Thin Straw Penetration/Apiration after swallow;Trace aspiration;Pharyngeal residue - pyriform;Reduced anterior laryngeal mobility Pharyngeal Material enters airway, passes BELOW cords and not ejected out despite cough attempt by patient Pharyngeal- Puree Pharyngeal residue - pyriform;Reduced anterior laryngeal mobility Pharyngeal -- Pharyngeal- Mechanical Soft Pharyngeal residue - pyriform;Reduced anterior laryngeal mobility Pharyngeal -- Pharyngeal- Regular Pharyngeal residue - pyriform;Reduced anterior laryngeal mobility Pharyngeal -- Pharyngeal- Multi-consistency -- Pharyngeal -- Pharyngeal- Pill Pharyngeal residue - pyriform;Reduced anterior laryngeal mobility Pharyngeal -- Pharyngeal Comment --  CHL IP CERVICAL ESOPHAGEAL PHASE 11/01/2019 Cervical Esophageal Phase Impaired Pudding Teaspoon -- Pudding Cup -- Honey  Teaspoon -- Honey Cup -- Nectar Teaspoon -- Nectar Cup Esophageal backflow into the pharynx Nectar Straw Esophageal backflow into the pharynx Thin Teaspoon -- Thin Cup Esophageal backflow into the pharynx Thin Straw Esophageal backflow into the pharynx Puree -- Mechanical Soft -- Regular -- Multi-consistency -- Pill Esophageal backflow into cervical esophagus Cervical Esophageal Comment -- Shanika I. Hardin Negus, [place], [place] Office number [phone] Pager [place] 11/01/2019, 3:08 PM              US Abdomen Limited RUQ  Result Date: 10/30/2019 CLINICAL DATA:  Right upper quadrant pain. EXAM: ULTRASOUND ABDOMEN LIMITED RIGHT UPPER QUADRANT COMPARISON:  None. FINDINGS: Gallbladder: The gallbladder surgically absent. Common bile duct: Diameter: 6 mm Liver: Diffuse increased echogenicity with slightly heterogeneous liver. Appearance typically secondary to fatty infiltration. Fibrosis secondary consideration. No secondary findings of cirrhosis noted. No focal hepatic lesion or intrahepatic biliary duct dilatation. Portal vein is patent on color Doppler imaging with normal direction of blood flow towards the liver. Other: None. IMPRESSION: 1. No acute abnormality. The patient is status post prior cholecystectomy. 2. Probable underlying hepatic steatosis. Electronically Signed   By: Constance Holster M.D.   On: 10/30/2019 16:18    Assessment/Plan  1. Pneumonia of both lungs due to infectious organism, unspecified part of lung - will start on doxycycline 100 mg twice a day x10 days and Florastor 250 mg 1 capsule twice a day x13 days    Family/ staff Communication: Discussed plan of care with resident and charge nurse.  Labs/tests ordered:  None  Goals of care:   Short-term care   Durenda Age, DNP, FNP-BC [facility] [phone] (Monday-Friday 8:00 a.m. - 5:00 p.m.) [phone] (after hours)

## 2019-11-25 ENCOUNTER — Encounter: Payer: Self-pay | Admitting: Adult Health

## 2019-11-25 ENCOUNTER — Non-Acute Institutional Stay (SKILLED_NURSING_FACILITY): Payer: Medicare Other | Admitting: Adult Health

## 2019-11-25 DIAGNOSIS — I2583 Coronary atherosclerosis due to lipid rich plaque: Secondary | ICD-10-CM

## 2019-11-25 DIAGNOSIS — J439 Emphysema, unspecified: Secondary | ICD-10-CM | POA: Diagnosis not present

## 2019-11-25 DIAGNOSIS — F1020 Alcohol dependence, uncomplicated: Secondary | ICD-10-CM | POA: Diagnosis not present

## 2019-11-25 DIAGNOSIS — R627 Adult failure to thrive: Secondary | ICD-10-CM

## 2019-11-25 DIAGNOSIS — I2510 Atherosclerotic heart disease of native coronary artery without angina pectoris: Secondary | ICD-10-CM | POA: Diagnosis not present

## 2019-11-25 NOTE — Progress Notes (Addendum)
Location:  [facility] Room Number: 216-A Place of Service:  SNF (31) Provider:  Durenda Age, DNP, FNP-BC  Patient Care Team: Patient, No Pcp Per as PCP - General (General Practice) Sueanne Margarita, MD as PCP - Cardiology (Cardiology)  Extended Emergency Contact Information Primary Emergency Contact: Ashvin, Adelson Mobile Phone: [phone] Relation: Brother Secondary Emergency Contact: Rece, Zechman Mobile Phone: [phone] Relation: Daughter  Code Status:  Full Code  Goals of care: Advanced Directive information Advanced Directives 11/15/2019  Does Patient Have a Medical Advance Directive? Yes  Type of Advance Directive (No Data)  Does patient want to make changes to medical advance directive? No - Patient declined  Copy of [place] in Chart? -  Would patient like information on creating a medical advance directive? -     Chief Complaint  Patient presents with  . Discharge Note    Patient is seen for discharge from SNF.     11/26/19 Addendum:  Daughter, [name] Pace, had previously agreed to get resident from the facility after work. The social worker called daughter and let [name] talk to her.  After a while daughter showed up at the facility and gave keys and money to patient and stated,"I don't want to be involved anymore. Social worker has set up an appointment with a new PCP, Dr. Lucianne Lei. Patient was aware of the appointment. Police was called to take him home safely. Patient signed AMA papers.   HPI:  Pt is a 69 y.o. male who is for discharge home with PT and OT.  He was admitted to [facility] on 11/07/19 post [facility] hospitalization 10/24/2019 to 11/07/19 with EtOH withdrawal.  He was started on CIWA protocol with Ativan and Librium which were eventually tapered down and discontinued.  He complained of chest pain.  CTA chest was negative.  Pain was thought to be from prior rib  fractures which were noted on imaging.  He was  started on isosorbide 5 mg daily and Voltaren gel.  He was treated for UTI with 5 days of IV ceftriaxone while in the hospital.  While at [facility], he was treated with Doxycycline X 10 days for PNA.  He has a PMH of alcohol abuse, breast cancer, prostate cancer and CVA.  Patient was admitted to this facility for short-term rehabilitation after the patient's recent hospitalization.  Patient has completed SNF rehabilitation and therapy has cleared the patient for discharge.   Past Medical History:  Diagnosis Date  . Alcohol abuse   . Anxiety   . Breast cancer ([place]) Left breast   Per patient diagnosed in early 20's.   . Cocaine abuse ([place]) 08/19/2018  . Coronary artery disease   . High cholesterol   . Left rib fracture 08/19/2018  . Prostate cancer ([place])    per patient diagnosed in early 3's  . Stroke [facility])    Past Surgical History:  Procedure Laterality Date  . BIOPSY  11/03/2019   Procedure: BIOPSY;  Surgeon: Otis Brace, MD;  Location: [facility];  Service: Gastroenterology;;  . CAROTID STENT    . CORONARY ARTERY BYPASS GRAFT    . ESOPHAGOGASTRODUODENOSCOPY (EGD) WITH PROPOFOL N/A 11/03/2019   Procedure: ESOPHAGOGASTRODUODENOSCOPY (EGD) WITH PROPOFOL with possible dilation;  Surgeon: Otis Brace, MD;  Location: [facility];  Service: Gastroenterology;  Laterality: N/A;  . FEMUR IM NAIL Left 07/02/2014   Procedure: INTRAMEDULLARY (IM) NAIL FEMORAL;  Surgeon: Elie Goody, MD;  Location: [facility];  Service: Orthopedics;  Laterality: Left;  . I & D EXTREMITY Right 02/10/2016   Procedure: IRRIGATION AND DEBRIDEMENT EXTREMITY;  Surgeon: Dorna Leitz, MD;  Location: [facility];  Service: Orthopedics;  Laterality: Right;  . TENDON REPAIR Right 02/10/2016   Procedure: Anterior TENDON REPAIR and wound exploration.;  Surgeon: Dorna Leitz, MD;  Location: [facility];  Service: Orthopedics;  Laterality: Right;    Allergies  Allergen  Reactions  . Penicillins Anaphylaxis    Did it involve swelling of the face/tongue/throat, SOB, or low BP? Yes Did it involve sudden or severe rash/hives, skin peeling, or any reaction on the inside of your mouth or nose? No Did you need to seek medical attention at a hospital or doctor's office? No When did it last happen?childhood If all above answers are "NO", may proceed with cephalosporin use.     Outpatient Encounter Medications as of 11/25/2019  Medication Sig  . acetaminophen (TYLENOL) 325 MG tablet Take 2 tablets (650 mg total) by mouth every 6 (six) hours as needed for up to 30 doses for mild pain, moderate pain or headache.  . Amino Acids-Protein Hydrolys (FEEDING SUPPLEMENT, PRO-STAT SUGAR FREE 64,) LIQD Take 30 mLs by mouth 2 (two) times daily.  . isosorbide mononitrate (ISMO) 10 MG tablet Take 0.5 tablets (5 mg total) by mouth daily.  . Multiple Vitamins-Minerals (MULTIVITAMIN WITH MINERALS) tablet Take 1 tablet by mouth daily.  . NON FORMULARY Med Pass 15ml bid to promote weight gain  . Nutritional Supplement LIQD Take 120 mLs by mouth in the morning and at bedtime.  Marland Kitchen tiotropium (SPIRIVA) 18 MCG inhalation capsule Place 18 mcg into inhaler and inhale daily.  . [DISCONTINUED] albuterol (VENTOLIN HFA) 108 (90 Base) MCG/ACT inhaler Inhale 2 puffs into the lungs every 6 (six) hours as needed for wheezing or shortness of breath. Use 2 puffs 3 times daily x5 days, then every 6 hours as needed. (Patient not taking: Reported on 10/14/2019)  . [DISCONTINUED] atorvastatin (LIPITOR) 40 MG tablet Take 1 tablet (40 mg total) by mouth daily at 6 PM. (Patient not taking: Reported on 10/14/2019)  . [DISCONTINUED] lisinopril (ZESTRIL) 40 MG tablet Take 1 tablet (40 mg total) by mouth daily. (Patient not taking: Reported on 10/14/2019)  . [DISCONTINUED] metoprolol tartrate (LOPRESSOR) 25 MG tablet Take 1 tablet (25 mg total) by mouth 2 (two) times daily. (Patient not taking: Reported on  10/14/2019)  . [DISCONTINUED] mometasone-formoterol (DULERA) 100-5 MCG/ACT AERO Inhale 2 puffs into the lungs 2 (two) times daily. (Patient not taking: Reported on 10/14/2019)   No facility-administered encounter medications on file as of 11/25/2019.    Review of Systems  GENERAL: No change in appetite, no fatigue, no weight changes, no fever, chills or weakness MOUTH and THROAT: Denies oral discomfort, gingival pain or bleeding RESPIRATORY: no cough, SOB, DOE, wheezing, hemoptysis CARDIAC: No chest pain, edema or palpitations GI: No abdominal pain, diarrhea, constipation, heart burn, nausea or vomiting GU: Denies dysuria, frequency, hematuria, incontinence, or discharge NEUROLOGICAL: Denies dizziness, syncope, numbness, or headache PSYCHIATRIC: Denies feelings of depression or anxiety. No report of hallucinations, insomnia, paranoia, or agitation   Immunization History  Administered Date(s) Administered  . Influenza, High Dose Seasonal PF 05/15/2018  . Pneumococcal Polysaccharide-23 06/16/2014, 05/15/2018  . Tdap 02/10/2016   Pertinent  Health Maintenance Due  Topic Date Due  . COLONOSCOPY  Never done  . PNA vac Low Risk Adult (2 of 2 - PCV13) 05/16/2019  . INFLUENZA VACCINE  01/05/2020    Vitals:  11/25/19 0950  BP: 135/72  Pulse: 62  Resp: 18  Temp: (!) 97.3 F (36.3 C)  TempSrc: Oral  Weight: 142 lb (64.4 kg)  Height: 6\' 4"  (1.93 m)   Body mass index is 17.28 kg/m.  Physical Exam  GENERAL APPEARANCE: In no acute distress. SKIN:  Skin is warm and dry.  MOUTH and THROAT: Lips are without lesions. Oral mucosa is moist and without lesions. Tongue is normal in shape, size, and color and without lesions RESPIRATORY: Breathing is even & unlabored, BS CTAB CARDIAC: RRR, no murmur,no extra heart sounds, no edema GI: Abdomen soft, normal BS, no masses, no tenderness EXTREMITIES:  Able to move X 4 extremities NEUROLOGICAL: There is no tremor. Speech is  clear. PSYCHIATRIC:  Affect and behavior are appropriate  Labs reviewed: Recent Labs    10/27/19 0304 10/27/19 0304 10/28/19 0412 10/28/19 0412 10/29/19 0434 11/01/19 0329 11/03/19 0747  NA 137   < > 135   < > 139 142 143  K 4.8   < > 3.7   < > 4.0 3.4* 4.7  CL 103   < > 101   < > 103 109 110  CO2 24   < > 19*   < > 24 25 20*  GLUCOSE 101*   < > 77   < > 101* 114* 93  BUN 13   < > 9   < > 13 16 12   CREATININE 0.60*   < > 0.64   < > 0.78 0.68 0.65  CALCIUM 8.8*   < > 8.7*   < > 8.9 8.7* 8.5*  MG 1.8  --  1.5*  --  2.1  --   --   PHOS 1.9*   < > 4.3  --  5.0* 3.3  --    < > = values in this interval not displayed.   Recent Labs    10/14/19 0757 10/14/19 0757 10/24/19 1536 10/25/19 0334 11/01/19 0329  AST 37  --  67* 36  --   ALT 31  --  36 28  --   ALKPHOS 91  --  103 87  --   BILITOT 0.9  --  0.9 1.1  --   PROT 6.2*  --  6.1* 5.3*  --   ALBUMIN 3.0*   < > 3.1* 2.7* 2.4*   < > = values in this interval not displayed.   Recent Labs    04/12/19 0300 04/12/19 0300 04/13/19 0334 04/14/19 1143 10/27/19 0304 10/29/19 0718 11/01/19 0329  WBC 6.7   < > 8.4   < > 7.1 7.3 6.6  NEUTROABS 4.4  --  5.1  --   --   --  4.3  HGB 12.0*   < > 12.4*   < > 13.7 15.1 12.7*  HCT 35.5*   < > 37.2*   < > 40.3 44.4 37.9*  MCV 107.3*   < > 108.5*   < > 109.8* 107.0* 108.0*  PLT 249   < > 261   < > 182 185 307   < > = values in this interval not displayed.   Lab Results  Component Value Date   TSH 0.343 (L) 12/01/2018   Lab Results  Component Value Date   HGBA1C 4.5 (L) 12/01/2018   Lab Results  Component Value Date   CHOL 218 (H) 04/06/2019   HDL 109 04/06/2019   LDLCALC 89 04/06/2019   TRIG 100 04/06/2019   CHOLHDL 2.0 04/06/2019  Significant Diagnostic Results in last 30 days:  DG Swallowing Func-Speech Pathology  Result Date: 11/01/2019 Objective Swallowing Evaluation: Type of Study: MBS-Modified Barium Swallow Study  Patient Details Name: [name] Pace MRN:  [id number] Date of Birth: [birth year] Today's Date: 11/01/2019 Time: SLP Start Time (ACUTE ONLY): 1027 -SLP Stop Time (ACUTE ONLY): 1052 SLP Time Calculation (min) (ACUTE ONLY): 25 min Past Medical History: Past Medical History: Diagnosis Date . Alcohol abuse  . Anxiety  . Breast cancer ([place]) Left breast  Per patient diagnosed in early 21's.  . Cocaine abuse ([place]) 08/19/2018 . Coronary artery disease  . High cholesterol  . Left rib fracture 08/19/2018 . Prostate cancer ([place])   per patient diagnosed in early 41's . Stroke [facility])  Past Surgical History: Past Surgical History: Procedure Laterality Date . CAROTID STENT   . CORONARY ARTERY BYPASS GRAFT   . FEMUR IM NAIL Left 07/02/2014  Procedure: INTRAMEDULLARY (IM) NAIL FEMORAL;  Surgeon: Elie Goody, MD;  [facility];  Service: Orthopedics;  Laterality: Left; . I & D EXTREMITY Right 02/10/2016  Procedure: IRRIGATION AND DEBRIDEMENT EXTREMITY;  Surgeon: Dorna Leitz, MD;  Location: [facility];  Service: Orthopedics;  Laterality: Right; . TENDON REPAIR Right 02/10/2016  Procedure: Anterior TENDON REPAIR and wound exploration.;  Surgeon: Dorna Leitz, MD;  Location: [facility];  Service: Orthopedics;  Laterality: Right; HPI: 69 yo male with CAD s/p CABG (2009), alcohol dependence with history of DTs/withdrawal, seizures, history of CVAs, and persistent anxiety. Admitted to [facility] on 10/24/19 for further evaluation and monitoring of chest pain, hematemesis, hematuria and alcohol withdrawal. Swallow eval ordered due to observed coughing when eating.  Pt well known to SLP service - tends to develop acute dysphagia related to mental stauts changes.  Subjective: sleepy Assessment / Plan / Recommendation CHL IP CLINICAL IMPRESSIONS 11/01/2019 Clinical Impression Pt presents with oropharyngeal dyspahgia characterized by prolonged mastication, reduced bolus cohesion, and reduced anterior laryngeal movement. He demonstrated premature spillage of liquids to the valleculae, occasional incomplete  epiglottic inversion, and inconsistent penetratio (PAS 3) of nectar thick liquids with ultimate aspiration (PAS 7) secondary to incomplete epiglottic inversion. Esophageal screening revealed slow movement of the 34mm barium tablet through the thoracic esophagus with retention of barium, backflow to the cervical esophagus and pharynx, and aspiration (PAS 7) of backflowed material. Pt exhibited more difficulty with backflow when there was a solid bolus and subsequent aspiration retained in the esophagus. It is therefore recommended that the pt's diet be modified to clear liquids and that an esophageal assessment and/or GI consult be completed. RN an MD have been advised of results and these recommendations.  SLP Visit Diagnosis Dysphagia, oropharyngeal phase (R13.12);Dysphagia, pharyngoesophageal phase (R13.14) Attention and concentration deficit following -- Frontal lobe and executive function deficit following -- Impact on safety and function Moderate aspiration risk   CHL IP TREATMENT RECOMMENDATION 11/01/2019 Treatment Recommendations Therapy as outlined in treatment plan below   Prognosis 11/01/2019 Prognosis for Safe Diet Advancement Good Barriers to Reach Goals Cognitive deficits Barriers/Prognosis Comment -- CHL IP DIET RECOMMENDATION 11/01/2019 SLP Diet Recommendations Thin liquid Liquid Administration via Cup;Straw Medication Administration Whole meds with liquid Compensations Minimize environmental distractions;Slow rate;Small sips/bites Postural Changes Remain semi-upright after after feeds/meals (Comment);Seated upright at 90 degrees   CHL IP OTHER RECOMMENDATIONS 11/01/2019 Recommended Consults Consider GI evaluation;Consider esophageal assessment Oral Care Recommendations Oral care BID Other Recommendations --   CHL IP FOLLOW UP RECOMMENDATIONS 11/01/2019 Follow up Recommendations None   CHL IP FREQUENCY AND DURATION 11/01/2019 Speech  Therapy Frequency (ACUTE ONLY) min 2x/week Treatment Duration 2 weeks       CHL IP ORAL PHASE 11/01/2019 Oral Phase Impaired Oral - Pudding Teaspoon -- Oral - Pudding Cup -- Oral - Honey Teaspoon -- Oral - Honey Cup -- Oral - Nectar Teaspoon -- Oral - Nectar Cup Decreased bolus cohesion;Delayed oral transit Oral - Nectar Straw Decreased bolus cohesion Oral - Thin Teaspoon -- Oral - Thin Cup Decreased bolus cohesion Oral - Thin Straw Decreased bolus cohesion Oral - Puree -- Oral - Mech Soft -- Oral - Regular Impaired mastication Oral - Multi-Consistency -- Oral - Pill -- Oral Phase - Comment --  CHL IP PHARYNGEAL PHASE 11/01/2019 Pharyngeal Phase Impaired Pharyngeal- Pudding Teaspoon -- Pharyngeal -- Pharyngeal- Pudding Cup -- Pharyngeal -- Pharyngeal- Honey Teaspoon -- Pharyngeal -- Pharyngeal- Honey Cup -- Pharyngeal -- Pharyngeal- Nectar Teaspoon -- Pharyngeal -- Pharyngeal- Nectar Cup -- Pharyngeal -- Pharyngeal- Nectar Straw Reduced epiglottic inversion;Pharyngeal residue - pyriform;Penetration/Aspiration during swallow Pharyngeal Material enters airway, remains ABOVE vocal cords and not ejected out;Material enters airway, passes BELOW cords and not ejected out despite cough attempt by patient Pharyngeal- Thin Teaspoon -- Pharyngeal -- Pharyngeal- Thin Cup Penetration/Apiration after swallow;Trace aspiration;Pharyngeal residue - pyriform;Reduced anterior laryngeal mobility Pharyngeal Material enters airway, passes BELOW cords and not ejected out despite cough attempt by patient Pharyngeal- Thin Straw Penetration/Apiration after swallow;Trace aspiration;Pharyngeal residue - pyriform;Reduced anterior laryngeal mobility Pharyngeal Material enters airway, passes BELOW cords and not ejected out despite cough attempt by patient Pharyngeal- Puree Pharyngeal residue - pyriform;Reduced anterior laryngeal mobility Pharyngeal -- Pharyngeal- Mechanical Soft Pharyngeal residue - pyriform;Reduced anterior laryngeal mobility Pharyngeal -- Pharyngeal- Regular Pharyngeal residue - pyriform;Reduced  anterior laryngeal mobility Pharyngeal -- Pharyngeal- Multi-consistency -- Pharyngeal -- Pharyngeal- Pill Pharyngeal residue - pyriform;Reduced anterior laryngeal mobility Pharyngeal -- Pharyngeal Comment --  CHL IP CERVICAL ESOPHAGEAL PHASE 11/01/2019 Cervical Esophageal Phase Impaired Pudding Teaspoon -- Pudding Cup -- Honey Teaspoon -- Honey Cup -- Nectar Teaspoon -- Nectar Cup Esophageal backflow into the pharynx Nectar Straw Esophageal backflow into the pharynx Thin Teaspoon -- Thin Cup Esophageal backflow into the pharynx Thin Straw Esophageal backflow into the pharynx Puree -- Mechanical Soft -- Regular -- Multi-consistency -- Pill Esophageal backflow into cervical esophagus Cervical Esophageal Comment -- Shanika I. Hardin Negus, [place], [place] Office number [phone] Pager [place] 11/01/2019, 3:08 PM              US Abdomen Limited RUQ  Result Date: 10/30/2019 CLINICAL DATA:  Right upper quadrant pain. EXAM: ULTRASOUND ABDOMEN LIMITED RIGHT UPPER QUADRANT COMPARISON:  None. FINDINGS: Gallbladder: The gallbladder surgically absent. Common bile duct: Diameter: 6 mm Liver: Diffuse increased echogenicity with slightly heterogeneous liver. Appearance typically secondary to fatty infiltration. Fibrosis secondary consideration. No secondary findings of cirrhosis noted. No focal hepatic lesion or intrahepatic biliary duct dilatation. Portal vein is patent on color Doppler imaging with normal direction of blood flow towards the liver. Other: None. IMPRESSION: 1. No acute abnormality. The patient is status post prior cholecystectomy. 2. Probable underlying hepatic steatosis. Electronically Signed   By: Constance Holster M.D.   On: 10/30/2019 16:18    Assessment/Plan  1. Severe alcohol use disorder ([place]) -  was started on CIWA protocol with Ativan and Librium which were eventually tapered down and discontinued.  -  For home health PT and OT, for therapeutic  strengthening exercises -  Fall precautions  2. Coronary artery disease due to lipid rich plaque - no chest pains -  isosorbide mononitrate (ISMO) 10 MG tablet; Take 0.5 tablets (5 mg total) by mouth daily.  Dispense: 15 tablet; Refill: 0  3. Pulmonary emphysema, unspecified emphysema type ([place]) - no wheezing - tiotropium (SPIRIVA) 18 MCG inhalation capsule; Place 1 capsule (18 mcg total) into inhaler and inhale daily.  Dispense: 30 capsule; Refill: 0  4. Adult failure to thrive - continue Prostat 30 ml BID and multivitamin with minerals 1 tab daily      I have filled out patient's discharge paperwork and e- prescribed medications.  Patient will receive home health PT and OT.  DME provided: Rolling walker and 3-in-1  Total discharge time: Greater than 30 minutes Greater than 50% was spent in counseling and coordination of care.   Discharge time involved coordination of the discharge process with social worker, nursing staff and therapy department. Medical justification for home health services/DME verified.    Durenda Age, DNP, FNP-BC [facility] [phone] (Monday-Friday 8:00 a.m. - 5:00 p.m.) [phone] (after hours)

## 2019-11-26 ENCOUNTER — Other Ambulatory Visit: Payer: Self-pay | Admitting: Adult Health

## 2019-11-26 DIAGNOSIS — I2583 Coronary atherosclerosis due to lipid rich plaque: Secondary | ICD-10-CM

## 2019-11-26 MED ORDER — ISOSORBIDE MONONITRATE 10 MG PO TABS: 5.0000 mg | ORAL_TABLET | Freq: Every day | ORAL | 0 refills | Status: AC

## 2019-11-26 MED ORDER — TIOTROPIUM BROMIDE MONOHYDRATE 18 MCG IN CAPS: 18.0000 ug | ORAL_CAPSULE | Freq: Every day | RESPIRATORY_TRACT | 0 refills | Status: AC

## 2019-12-05 ENCOUNTER — Encounter: Payer: Self-pay | Admitting: Physician Assistant

## 2019-12-05 NOTE — Progress Notes (Deleted)
Cardiology Office Note    Date:  12/05/2019   ID:  [name] Pace, DOB 12-Feb-1951, MRN [id number]  PCP:  Patient, No Pcp Per  Cardiologist:  Fransico Him, MD  Electrophysiologist:  None   Chief Complaint: f/u CAD  History of Present Illness:   [name] Pace is a 69 y.o. male with history of CAD (s/p CABG in 2003 with LIMA-LAD, SVG-Cx, SVG-RCA, PTCA/stenting to RCA 2006, left subclavian artery stenosis s/p stent in 2006 (occluded), depression/anxiety with PTSD ([place] war vet) and subsequent chronic alcohol abuse, COPD, cocaine abuse, chronic tobacco abuse, previous CVA who presents for post-hospital follow-up.   He was lost to follow-up for a long time after prior intervention, but seen by our team again in fall 2020 for chest pain felt musculoskeletal. 2D echo 11/2018 showed EF 55%, mildly dilated LV, pseudonormalization, basal inferior hypokinesis, moderate LAE, mild dilation of aortic root, normal RV. More recently he was admitted to medical service 5/20-11/07/19 with alcohol withdrawal, subjective hematemesis/hematuria, recurrent falls, malnutrition and UTI. He also complained of chest pain but ACS was ruled out. CTA was negative for dissection but did show chronically occluded left subclavian stent. Vascular surgery recommended continued observation. Due to the stent, they would have to do a carotid-subclavian bypass, which would be fairly invasive without worsened symptoms. The pain was felt related to chronic pain from prior rib fractures. He also had an EGD showing esophageal spasms therefore he was started on very low dose isosorbide.  bilateral BP aortic root echo  CAD Left subclavian artery stenosis Mild dilation of aortic root Polysubstance abuse  Labwork independently reviewed: 10/2019 K 4.7, Cr 0.65, Hgb 12.7, MCV 108, UDS + benzos, albumin 3.1, AST 67, ALT 36, Mg 1.7 08/2019 UDS + cocaine   Past Medical History:  Diagnosis Date   Alcohol abuse    Anxiety    Breast  cancer ([place]) Left breast   Per patient diagnosed in early 40's.    Cocaine abuse ([place]) 08/19/2018   Coronary artery disease    a. s/p CABG 2003 with LIMA-LAD, SVG-Cx, SVG-RCA. b. PTCA/stenting to RCA 2006.   Depression    Esophageal spasm    High cholesterol    Left rib fracture 08/19/2018   Prostate cancer ([place])    per patient diagnosed in early 50's   PTSD (post-traumatic stress disorder)    Stroke [facility])    Subclavian artery stenosis, left ([place])    a. s/p stent in 2006 (occluded).   Tobacco abuse     Past Surgical History:  Procedure Laterality Date   BIOPSY  11/03/2019   Procedure: BIOPSY;  Surgeon: Otis Brace, MD;  Location: [facility];  Service: Gastroenterology;;   CAROTID STENT     CORONARY ARTERY BYPASS GRAFT     ESOPHAGOGASTRODUODENOSCOPY (EGD) WITH PROPOFOL N/A 11/03/2019   Procedure: ESOPHAGOGASTRODUODENOSCOPY (EGD) WITH PROPOFOL with possible dilation;  Surgeon: Otis Brace, MD;  Location: [facility];  Service: Gastroenterology;  Laterality: N/A;   FEMUR IM NAIL Left 07/02/2014   Procedure: INTRAMEDULLARY (IM) NAIL FEMORAL;  Surgeon: Elie Goody, MD;  Location: [facility];  Service: Orthopedics;  Laterality: Left;   I & D EXTREMITY Right 02/10/2016   Procedure: IRRIGATION AND DEBRIDEMENT EXTREMITY;  Surgeon: Dorna Leitz, MD;  Location: [facility];  Service: Orthopedics;  Laterality: Right;   TENDON REPAIR Right 02/10/2016   Procedure: Anterior TENDON REPAIR and wound exploration.;  Surgeon: Dorna Leitz, MD;  Location: [facility];  Service: Orthopedics;  Laterality: Right;  Current Medications: No outpatient medications have been marked as taking for the 12/10/19 encounter (Appointment) with Charlie Pitter, PA-C.   ***   Allergies:   Penicillins   Social History   Socioeconomic History   Marital status: Married    Spouse name: Not on file   Number of children: Not on file   Years of education: Not on file   Highest education level:  Not on file  Occupational History   Not on file  Tobacco Use   Smoking status: Current Every Day Smoker    Packs/day: 1.00    Years: 42.00    Pack years: 42.00    Types: Cigarettes   Smokeless tobacco: Never Used  Vaping Use   Vaping Use: Never used  Substance and Sexual Activity   Alcohol use: Yes    Comment: 4-5 beers a day   Drug use: Yes    Types: Cocaine   Sexual activity: Not on file  Other Topics Concern   Not on file  Social History Narrative   Not on file   Social Determinants of Health   Financial Resource Strain:    Difficulty of Paying Living Expenses:   Food Insecurity:    Worried About Charity fundraiser in the Last Year:    Arboriculturist in the Last Year:   Transportation Needs:    Film/video editor (Medical):    Lack of Transportation (Non-Medical):   Physical Activity:    Days of Exercise per Week:    Minutes of Exercise per Session:   Stress:    Feeling of Stress :   Social Connections:    Frequency of Communication with Friends and Family:    Frequency of Social Gatherings with Friends and Family:    Attends Religious Services:    Active Member of Clubs or Organizations:    Attends Archivist Meetings:    Marital Status:      Family History:  The patient's ***family history includes Dementia in his mother.  ROS:   Please see the history of present illness. Otherwise, review of systems is positive for ***.  All other systems are reviewed and otherwise negative.    EKGs/Labs/Other Studies Reviewed:    Studies reviewed are outlined and summarized above. Reports included below if pertinent.  2D echo 11/2018 1. The left ventricle has a visually estimated ejection fraction of 55%.  The cavity size was mildly dilated. Left ventricular diastolic Doppler  parameters are consistent with pseudonormalization. Basal inferior  hypokinesis.  2. Left atrial size was moderately dilated.  3. No evidence of  mitral valve stenosis. Trivial mitral regurgitation.  4. The aortic valve is tricuspid. Mild calcification of the aortic valve.  No stenosis of the aortic valve.  5. There is mild dilatation of the aortic root measuring 41 mm.  6. Normal IVC size. No complete TR doppler jet so unable to estimate PA  systolic pressure.  7. The right ventricle has normal systolic function. The cavity was  normal. There is no increase in right ventricular wall thickness.     EKG:  EKG is ordered today, personally reviewed, demonstrating ***  Recent Labs: 10/25/2019: ALT 28 10/29/2019: Magnesium 2.1 11/01/2019: Hemoglobin 12.7; Platelets 307 11/03/2019: BUN 12; Creatinine, Ser 0.65; Potassium 4.7; Sodium 143  Recent Lipid Panel    Component Value Date/Time   CHOL 218 (H) 04/06/2019 0831   TRIG 100 04/06/2019 0831   HDL 109 04/06/2019 0831   CHOLHDL 2.0 04/06/2019  0831   VLDL 20 04/06/2019 0831   LDLCALC 89 04/06/2019 0831    PHYSICAL EXAM:    VS:  There were no vitals taken for this visit.  BMI: There is no height or weight on file to calculate BMI.  GEN: Well nourished, well developed, in no acute distress HEENT: normocephalic, atraumatic Neck: no JVD, carotid bruits, or masses Cardiac: ***RRR; no murmurs, rubs, or gallops, no edema  Respiratory:  clear to auscultation bilaterally, normal work of breathing GI: soft, nontender, nondistended, + BS MS: no deformity or atrophy Skin: warm and dry, no rash Neuro:  Alert and Oriented x 3, Strength and sensation are intact, follows commands Psych: euthymic mood, full affect  Wt Readings from Last 3 Encounters:  11/25/19 142 lb (64.4 kg)  11/15/19 140 lb 3.2 oz (63.6 kg)  11/07/19 136 lb 9.6 oz (62 kg)     ASSESSMENT & PLAN:   1. ***  Disposition: F/u with ***   Medication Adjustments/Labs and Tests Ordered: Current medicines are reviewed at length with the patient today.  Concerns regarding medicines are outlined above. Medication  changes, Labs and Tests ordered today are summarized above and listed in the Patient Instructions accessible in Encounters.   Signed, Charlie Pitter, PA-C  12/05/2019 3:56 PM    [facility] [address], [place],   [address] Phone: [phone]; Fax: [phone]

## 2019-12-05 DEATH — deceased

## 2019-12-10 ENCOUNTER — Ambulatory Visit: Payer: Medicare Other | Admitting: Physician Assistant

## 2020-08-26 IMAGING — DX DG CHEST 1V PORT
1 series · 1 of 1 positions shown · non-contrast
Comparison: Chest CT dated 05/13/2018

CLINICAL DATA: 67-year-old male with right rib fractures.

EXAM:
PORTABLE CHEST 1 VIEW

[chest ap]
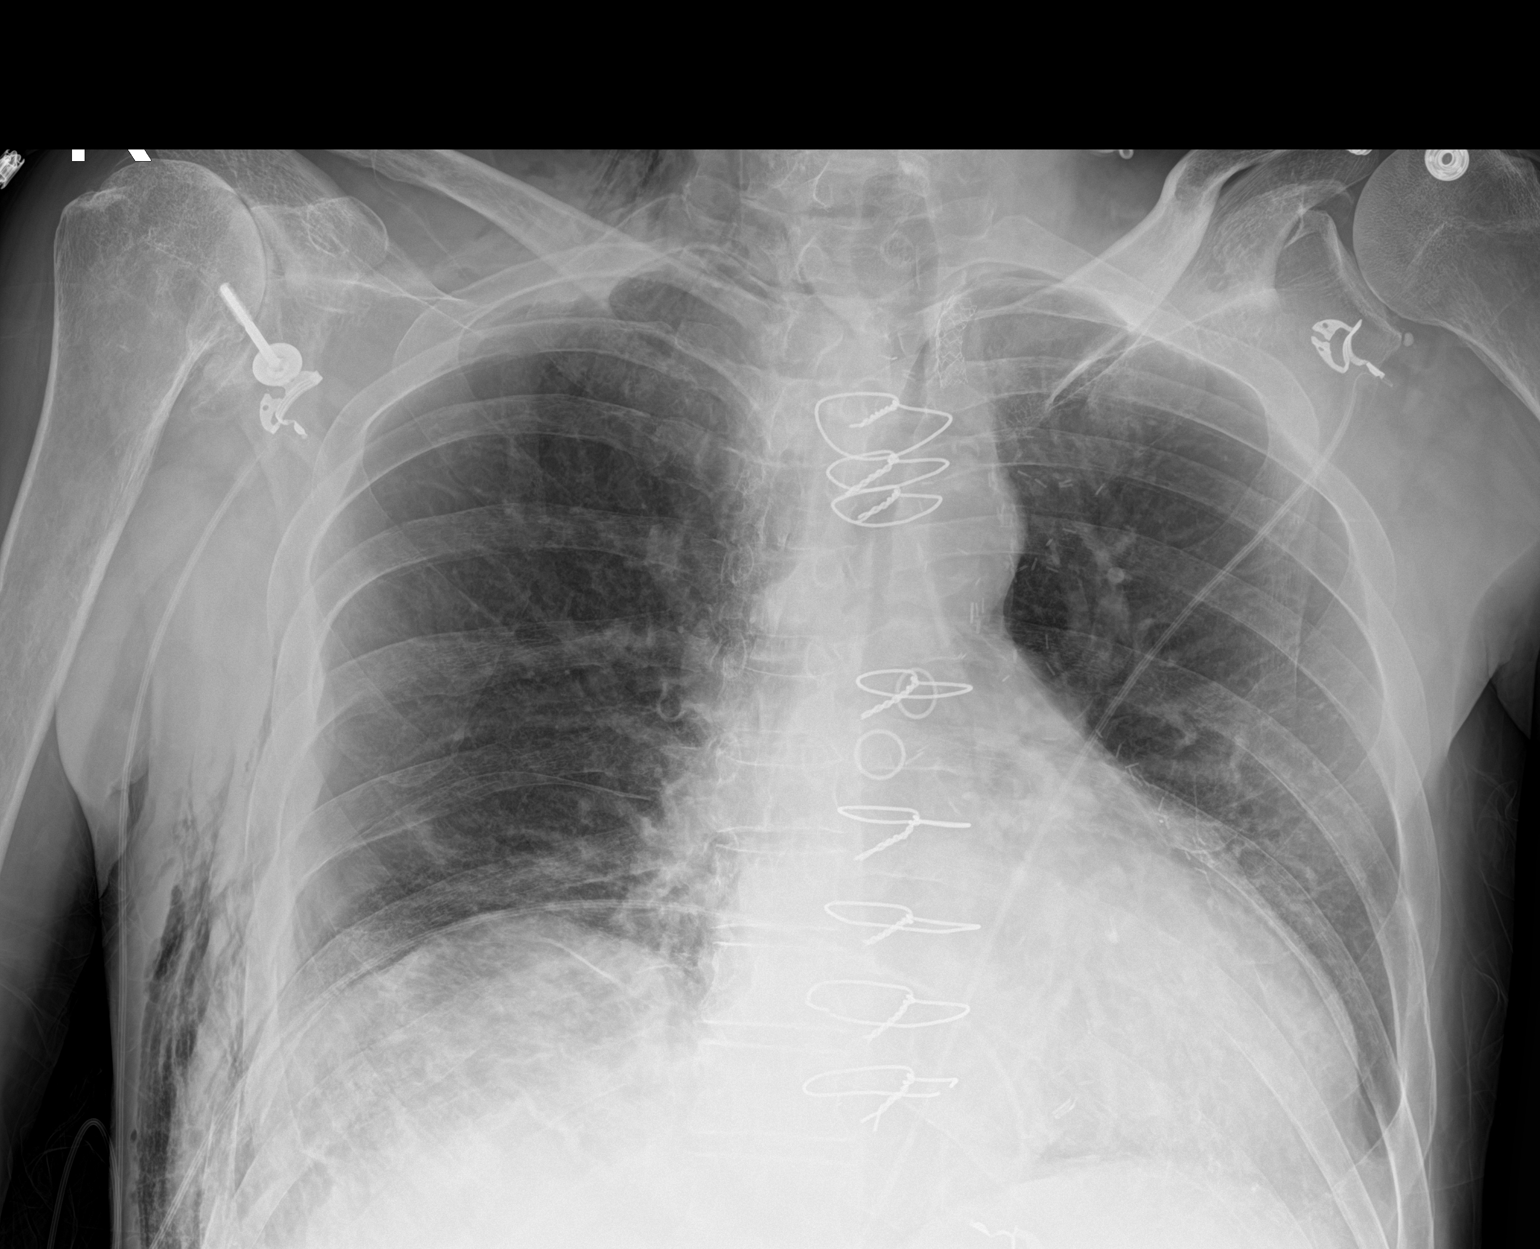

[1 of 1 positions shown; findings below may reference images not displayed]

FINDINGS: No visible pneumothorax. Right rib fractures with associated soft
tissue emphysema of the right lateral chest wall. Bibasilar
atelectasis. Cardiomegaly, median sternotomy wires, and CABG
vascular clips. Left subclavian artery stent.
IMPRESSION: 1. No visible pneumothorax.
2. Right rib fractures with associated soft tissue emphysema of the
right lateral chest wall.
